# Patient Record
Sex: Male | Born: 1935 | Race: White | Hispanic: No | Marital: Married | Smoking: Never smoker
Health system: Southern US, Community
[De-identification: ages and names within clinical notes are randomized; demographics above are authoritative.]

## PROBLEM LIST (undated history)

## (undated) DIAGNOSIS — H9190 Unspecified hearing loss, unspecified ear: Secondary | ICD-10-CM

## (undated) DIAGNOSIS — M1990 Unspecified osteoarthritis, unspecified site: Secondary | ICD-10-CM

## (undated) DIAGNOSIS — F32A Depression, unspecified: Secondary | ICD-10-CM

## (undated) DIAGNOSIS — M810 Age-related osteoporosis without current pathological fracture: Secondary | ICD-10-CM

## (undated) DIAGNOSIS — Z923 Personal history of irradiation: Secondary | ICD-10-CM

## (undated) DIAGNOSIS — K5900 Constipation, unspecified: Secondary | ICD-10-CM

## (undated) DIAGNOSIS — E785 Hyperlipidemia, unspecified: Secondary | ICD-10-CM

## (undated) DIAGNOSIS — N400 Enlarged prostate without lower urinary tract symptoms: Secondary | ICD-10-CM

## (undated) DIAGNOSIS — Z9989 Dependence on other enabling machines and devices: Secondary | ICD-10-CM

## (undated) DIAGNOSIS — K5790 Diverticulosis of intestine, part unspecified, without perforation or abscess without bleeding: Secondary | ICD-10-CM

## (undated) DIAGNOSIS — R011 Cardiac murmur, unspecified: Secondary | ICD-10-CM

## (undated) DIAGNOSIS — K2270 Barrett's esophagus without dysplasia: Secondary | ICD-10-CM

## (undated) DIAGNOSIS — D649 Anemia, unspecified: Secondary | ICD-10-CM

## (undated) DIAGNOSIS — Q2730 Arteriovenous malformation, site unspecified: Secondary | ICD-10-CM

## (undated) DIAGNOSIS — D72820 Lymphocytosis (symptomatic): Secondary | ICD-10-CM

## (undated) DIAGNOSIS — C61 Malignant neoplasm of prostate: Secondary | ICD-10-CM

## (undated) DIAGNOSIS — E162 Hypoglycemia, unspecified: Secondary | ICD-10-CM

## (undated) DIAGNOSIS — N644 Mastodynia: Secondary | ICD-10-CM

## (undated) DIAGNOSIS — E559 Vitamin D deficiency, unspecified: Secondary | ICD-10-CM

## (undated) DIAGNOSIS — F329 Major depressive disorder, single episode, unspecified: Secondary | ICD-10-CM

## (undated) DIAGNOSIS — R0981 Nasal congestion: Secondary | ICD-10-CM

## (undated) DIAGNOSIS — R339 Retention of urine, unspecified: Secondary | ICD-10-CM

## (undated) DIAGNOSIS — G4730 Sleep apnea, unspecified: Secondary | ICD-10-CM

## (undated) DIAGNOSIS — K219 Gastro-esophageal reflux disease without esophagitis: Secondary | ICD-10-CM

## (undated) HISTORY — DX: Diverticulosis of intestine, part unspecified, without perforation or abscess without bleeding: K57.90

## (undated) HISTORY — DX: Barrett's esophagus without dysplasia: K22.70

## (undated) HISTORY — DX: Lymphocytosis (symptomatic): D72.820

## (undated) HISTORY — DX: Sleep apnea, unspecified: G47.30

## (undated) HISTORY — PX: VASECTOMY: SHX75

## (undated) HISTORY — PX: UPPER GASTROINTESTINAL ENDOSCOPY: SHX188

## (undated) HISTORY — DX: Vitamin D deficiency, unspecified: E55.9

## (undated) HISTORY — PX: FINGER SURGERY: SHX640

## (undated) HISTORY — DX: Gastro-esophageal reflux disease without esophagitis: K21.9

## (undated) HISTORY — DX: Unspecified osteoarthritis, unspecified site: M19.90

## (undated) HISTORY — DX: Major depressive disorder, single episode, unspecified: F32.9

## (undated) HISTORY — PX: TONSILLECTOMY: SUR1361

## (undated) HISTORY — DX: Hyperlipidemia, unspecified: E78.5

## (undated) HISTORY — DX: Depression, unspecified: F32.A

## (undated) HISTORY — PX: EYE SURGERY: SHX253

## (undated) HISTORY — DX: Benign prostatic hyperplasia without lower urinary tract symptoms: N40.0

## (undated) HISTORY — PX: INGUINAL HERNIA REPAIR: SUR1180

## (undated) HISTORY — DX: Arteriovenous malformation, site unspecified: Q27.30

## (undated) HISTORY — PX: CATARACT EXTRACTION: SUR2

## (undated) HISTORY — PX: COLONOSCOPY: SHX174

## (undated) HISTORY — PX: SPINAL FUSION: SHX223

## (undated) HISTORY — PX: OTHER SURGICAL HISTORY: SHX169

---

## 2000-03-08 ENCOUNTER — Ambulatory Visit (HOSPITAL_COMMUNITY): Admission: RE | Admit: 2000-03-08 | Discharge: 2000-03-08 | Payer: Self-pay | Admitting: Specialist

## 2000-03-08 ENCOUNTER — Encounter: Payer: Self-pay | Admitting: Specialist

## 2001-05-27 ENCOUNTER — Encounter: Admission: RE | Admit: 2001-05-27 | Discharge: 2001-05-27 | Payer: Self-pay | Admitting: Specialist

## 2001-05-27 ENCOUNTER — Encounter: Payer: Self-pay | Admitting: Specialist

## 2004-05-30 ENCOUNTER — Encounter: Admission: RE | Admit: 2004-05-30 | Discharge: 2004-05-30 | Payer: Self-pay | Admitting: Internal Medicine

## 2004-07-27 ENCOUNTER — Encounter: Payer: Self-pay | Admitting: Gastroenterology

## 2004-10-12 ENCOUNTER — Encounter: Admission: RE | Admit: 2004-10-12 | Discharge: 2004-10-12 | Payer: Self-pay | Admitting: Specialist

## 2005-01-05 ENCOUNTER — Encounter: Admission: RE | Admit: 2005-01-05 | Discharge: 2005-01-05 | Payer: Self-pay | Admitting: Internal Medicine

## 2007-12-18 ENCOUNTER — Encounter: Admission: RE | Admit: 2007-12-18 | Discharge: 2007-12-18 | Payer: Self-pay | Admitting: Specialist

## 2008-07-20 ENCOUNTER — Ambulatory Visit: Payer: Self-pay | Admitting: Gastroenterology

## 2008-07-20 DIAGNOSIS — K219 Gastro-esophageal reflux disease without esophagitis: Secondary | ICD-10-CM

## 2008-07-20 DIAGNOSIS — R195 Other fecal abnormalities: Secondary | ICD-10-CM

## 2008-07-20 DIAGNOSIS — K4090 Unilateral inguinal hernia, without obstruction or gangrene, not specified as recurrent: Secondary | ICD-10-CM | POA: Insufficient documentation

## 2008-07-20 DIAGNOSIS — R1319 Other dysphagia: Secondary | ICD-10-CM | POA: Insufficient documentation

## 2008-07-20 DIAGNOSIS — K439 Ventral hernia without obstruction or gangrene: Secondary | ICD-10-CM | POA: Insufficient documentation

## 2008-07-20 DIAGNOSIS — K5900 Constipation, unspecified: Secondary | ICD-10-CM | POA: Insufficient documentation

## 2008-07-27 ENCOUNTER — Telehealth: Payer: Self-pay | Admitting: Gastroenterology

## 2008-08-04 ENCOUNTER — Encounter: Payer: Self-pay | Admitting: Gastroenterology

## 2008-08-04 ENCOUNTER — Ambulatory Visit: Payer: Self-pay | Admitting: Gastroenterology

## 2008-08-07 ENCOUNTER — Encounter: Payer: Self-pay | Admitting: Gastroenterology

## 2008-12-11 ENCOUNTER — Encounter: Payer: Self-pay | Admitting: Gastroenterology

## 2009-02-19 ENCOUNTER — Ambulatory Visit (HOSPITAL_BASED_OUTPATIENT_CLINIC_OR_DEPARTMENT_OTHER): Admission: RE | Admit: 2009-02-19 | Discharge: 2009-02-19 | Payer: Self-pay | Admitting: Orthopedic Surgery

## 2009-02-19 ENCOUNTER — Encounter (INDEPENDENT_AMBULATORY_CARE_PROVIDER_SITE_OTHER): Payer: Self-pay | Admitting: Orthopedic Surgery

## 2009-09-29 ENCOUNTER — Encounter: Admission: RE | Admit: 2009-09-29 | Discharge: 2009-09-29 | Payer: Self-pay | Admitting: Specialist

## 2009-12-04 HISTORY — PX: REPAIR SPIGELIAN HERNIA: SUR1208

## 2010-02-22 ENCOUNTER — Ambulatory Visit: Payer: Self-pay | Admitting: Gastroenterology

## 2010-02-22 DIAGNOSIS — K2270 Barrett's esophagus without dysplasia: Secondary | ICD-10-CM

## 2010-02-22 DIAGNOSIS — R490 Dysphonia: Secondary | ICD-10-CM | POA: Insufficient documentation

## 2010-02-25 ENCOUNTER — Ambulatory Visit (HOSPITAL_COMMUNITY)
Admission: RE | Admit: 2010-02-25 | Discharge: 2010-02-25 | Payer: Self-pay | Source: Home / Self Care | Admitting: Gastroenterology

## 2010-03-07 ENCOUNTER — Encounter: Payer: Self-pay | Admitting: Gastroenterology

## 2010-03-23 ENCOUNTER — Telehealth: Payer: Self-pay | Admitting: Gastroenterology

## 2010-03-24 ENCOUNTER — Ambulatory Visit (HOSPITAL_COMMUNITY): Admission: RE | Admit: 2010-03-24 | Discharge: 2010-03-24 | Payer: Self-pay | Admitting: Surgery

## 2010-03-24 ENCOUNTER — Encounter (INDEPENDENT_AMBULATORY_CARE_PROVIDER_SITE_OTHER): Payer: Self-pay | Admitting: *Deleted

## 2010-04-27 ENCOUNTER — Telehealth: Payer: Self-pay | Admitting: Gastroenterology

## 2010-07-20 ENCOUNTER — Telehealth: Payer: Self-pay | Admitting: Gastroenterology

## 2010-09-19 ENCOUNTER — Ambulatory Visit: Payer: Self-pay | Admitting: Gastroenterology

## 2010-11-21 ENCOUNTER — Ambulatory Visit: Payer: Self-pay | Admitting: Gastroenterology

## 2010-11-21 DIAGNOSIS — K449 Diaphragmatic hernia without obstruction or gangrene: Secondary | ICD-10-CM | POA: Insufficient documentation

## 2010-11-21 DIAGNOSIS — K5730 Diverticulosis of large intestine without perforation or abscess without bleeding: Secondary | ICD-10-CM | POA: Insufficient documentation

## 2010-11-29 ENCOUNTER — Encounter: Payer: Self-pay | Admitting: Gastroenterology

## 2010-11-29 ENCOUNTER — Ambulatory Visit: Payer: Self-pay | Admitting: Internal Medicine

## 2010-12-24 ENCOUNTER — Encounter: Payer: Self-pay | Admitting: Specialist

## 2011-01-05 NOTE — Progress Notes (Signed)
Summary: med change  Phone Note Call from Patient Call back at Home Phone [phone]   Caller: Patient Call For: Dr. Russella Dar Reason for Call: Talk to Nurse Summary of Call: would like to change Nexium to different medication... too expensive and "inconvenient" Initial call taken by: Vallarie Mare,  Apr 27, 2010 11:18 AM  Follow-up for Phone Call        Pt states Nexium is too expensive and wants to take a generic PPI to take b/c his insurance will not cover enough of the brand names. Pt states he has a prescription for omeprazole 40mg  one tablet by mouth once daily. I told him to take omeprazole 40mg  one tablet by mouth two times a day and the Zantac at night and to call us when he needs a refill. Pt agreed. Follow-up by: Christie Nottingham CMA Duncan Dull),  Apr 27, 2010 2:12 PM

## 2011-01-05 NOTE — Assessment & Plan Note (Signed)
Summary: dysphagia...as.   History of Present Illness Visit Type: Follow-up Visit Primary GI MD: Elie Goody MD [facility] Primary Provider: Jarome Matin MD Requesting Provider: na Chief Complaint: Patient complains of dysphagia with water sometimes. He states that the dysphagia started about 6 months ago. He complains of some coughing and esophgeal tightness. History of Present Illness:   Charles [name], returns today with his wife, complaining of hoarseness and coughing when eating solids and liquids. He localizes his symptoms to his throat. He states his heartburn is well controlled on omeprazole. Evaluation in 2009, including a barium esophagram and upper endoscopy, showed Barrett's esophagus but no anatomic abnormalities to explain dysphagia. He did undergo an empiric dilation.   GI Review of Systems    Reports dysphagia with liquids and  dysphagia with solids.      Denies abdominal pain, acid reflux, belching, bloating, chest pain, heartburn, loss of appetite, nausea, vomiting, vomiting blood, weight loss, and  weight gain.        Denies anal fissure, black tarry stools, change in bowel habit, constipation, diarrhea, diverticulosis, fecal incontinence, heme positive stool, hemorrhoids, irritable bowel syndrome, jaundice, light color stool, liver problems, rectal bleeding, and  rectal pain.   Current Medications (verified): 1)  Ultram 50 Mg  Tabs (Tramadol Hcl) .... Take Two By Mouth Once Daily 2)  Nabumetone 500 Mg  Tabs (Nabumetone) .Marland Kitchen.. 1 Two Times A Day 3)  Flexeril 5 Mg  Tabs (Cyclobenzaprine Hcl) .... As Needed 4)  Finasteride 5 Mg  Tabs (Finasteride) .Marland Kitchen.. 1 Once Daily 5)  Flomax 0.4 Mg  Xr24h-Cap (Tamsulosin Hcl) .Marland Kitchen.. 1 Once Daily 6)  Topicort 0.25 %  Crea (Desoximetasone) .... As Needed 7)  Budeprion Sr 150 Mg Xr12h-Tab (Bupropion Hcl) .... Take Two By Mouth Two Times A Day 8)  Lovastatin 20 Mg  Tabs (Lovastatin) .Marland Kitchen.. 1 Once Daily 9)  Bactroban 2 %  Oint (Mupirocin) ....  As Needed 10)  Adult Aspirin Low Strength 81 Mg  Tbdp (Aspirin) .Marland Kitchen.. 1 Once Daily 11)  Niacin 250 Mg Tabs (Niacin) .... Take One By Mouth Once Daily 12)  Centrum Silver   Tabs (Multiple Vitamins-Minerals) .Marland Kitchen.. 1 Once Daily 13)  Calcium + D + K 750-500-40 Mg-Unt-Mcg  Tabs (Calcium-Vitamin D-Vitamin K) .Marland Kitchen.. 1 Once Daily 14)  Omeprazole 40 Mg  Cpdr (Omeprazole) .Marland Kitchen.. 1 Each Day 30 Minutes Before Meal 15)  Vicodin 5-500 Mg Tabs (Hydrocodone-Acetaminophen) .... Take One By Mouth At Bedtime 16)  Flonase 50 Mcg/act Susp (Fluticasone Propionate) .... Two Sprays Each Nostril Once A Day 17)  Fish Oil 1200 Mg Caps (Omega-3 Fatty Acids) .... Take One By Mouth Once Daily 18)  Cpap .... Use At Night  Allergies (verified): No Known Drug Allergies  Past History:  Past Medical History: Diverticulosis GERD Hyperlipidemia Hypoglycemia CAD BPH Depression Sleep apena (cpap) Barretts esophagus Spigelian hernia L inguinal hernia  Past Surgical History: L-spine fusion Hernia Surgery X 3 Tonsillectomy  Family History: Reviewed history from 07/20/2008 and no changes required. Mother: CAD Family History of Prostate Cancer: FATHER No FH of Colon Cancer:  Social History: Reviewed history from 07/17/2008 and no changes required. Married Retired Patient has never smoked.  Alcohol Use - no  Review of Systems       The patient complains of allergy/sinus, arthritis/joint pain, back pain, change in vision, cough, depression-new, heart murmur, sleeping problems, and voice change.         The pertinent positives and negatives are noted as above and in  the HPI. All other ROS were reviewed and were negative.   Vital Signs:  Patient profile:   77 year old male Height:      68 inches Weight:      171.7 pounds BMI:     26.20 Pulse rate:   62 / minute Pulse rhythm:   regular BP sitting:   126 / 64  (left arm) Cuff size:   regular  Vitals Entered By: Harlow Mares CMA Duncan Dull) (February 22, 2010 10:36  AM)  Physical Exam  General:  Well developed, well nourished, no acute distress. Head:  Normocephalic and atraumatic. Eyes:  PERRLA, no icterus. Ears:  Normal auditory acuity. Mouth:  No deformity or lesions, dentition normal. Neck:  Supple; no masses or thyromegaly. Lungs:  Clear throughout to auscultation. Heart:  Regular rate and rhythm; no murmurs, rubs,  or bruits. Abdomen:  Soft, nontender and nondistended. No masses, hepatosplenomegaly or hernias noted. Normal bowel sounds. Msk:  Symmetrical with no gross deformities. Normal posture. Pulses:  Normal pulses noted. Neurologic:  Alert and  oriented x4;  grossly normal neurologically. Cervical Nodes:  No significant cervical adenopathy. Inguinal Nodes:  No significant inguinal adenopathy. Psych:  Alert and cooperative. Normal mood and affect.  Impression & Recommendations:  Problem # 1:  DYSPHAGIA (ZOX-096.04) His symptoms are more typical for oropharyngeal dysphagia. Possible LPR. Schedule modified barium esophagram and ENT consultation. Consider increasing omeprazole to twice daily, will await ENT consult and swallow study results. Orders: Barium Swallow, Modified  (Modified BS)  Problem # 2:  BARRETT'S ESOPHAGUS (ICD-530.85) Continue omeprazole 40 mg daily, and standard antireflux measures.  Problem # 3:  OTH VENTRAL HERN W/O MENTION OBSTRUCTION/GANGREN (ICD-553.29) Spigelian hernia. Surgical management per Dr. Jamey Ripa planned on 03/24/10.  Problem # 4:  INGUINAL HERNIA, LEFT, SMALL (ICD-550.90) Further management per Dr. Jamey Ripa.  Other Orders: [facility] ([facility])  Patient Instructions: 1)  You have been scheduled to see Dr. Jearld Fenton at [facility] on 03/04/10 at 2:00pm arriving at 1:45pm. Please bring a current med list and copay. 2)  You have also been scheduled for a Modified Barium Swallow on 02/25/10 at 11:00 arriving at 1st floor admitting at [facility]. 3)  Pick up your prescription at your  pharmacy.  4)  Copy sent to : Jarome Matin, MD 5)  The medication list was reviewed and reconciled.  All changed / newly prescribed medications were explained.  A complete medication list was provided to the patient / caregiver.  Prescriptions: OMEPRAZOLE 40 MG  CPDR (OMEPRAZOLE) 1 each day 30 minutes before meal  #90 x 3   Entered by:   Christie Nottingham CMA (AAMA)   Authorized by:   Meryl Dare MD [facility]   Signed by:   Christie Nottingham CMA (AAMA) on 02/22/2010   Method used:   Electronically to        [facility] ([facility]ail)       [address]       [place], [place]  [address]       Ph: [phone]       Fax: [phone]   RxID:   [id number]

## 2011-01-05 NOTE — Assessment & Plan Note (Signed)
Summary: F/U APPT...LSW.   History of Present Illness Visit Type: Follow-up Visit Primary GI MD: Elie Goody MD [facility] Primary Provider: Jarome Matin MD Requesting Provider: na Chief Complaint: F/u for dysphagia, voice changes, and cough. Pt states that he is feeling better but still has cough and hoarseness  History of Present Illness:   Charles [name], and his wife does report a substantial improvement in all his symptoms. He is following antireflux measures very closely.   GI Review of Systems      Denies abdominal pain, acid reflux, belching, bloating, chest pain, dysphagia with liquids, dysphagia with solids, heartburn, loss of appetite, nausea, vomiting, vomiting blood, weight loss, and  weight gain.        Denies anal fissure, black tarry stools, change in bowel habit, constipation, diarrhea, diverticulosis, fecal incontinence, heme positive stool, hemorrhoids, irritable bowel syndrome, jaundice, light color stool, liver problems, rectal bleeding, and  rectal pain.   Current Medications (verified): 1)  Ultram 50 Mg  Tabs (Tramadol Hcl) .... Take Two By Mouth Will Take Up To Three Times A Day Depending 2)  Nabumetone 500 Mg  Tabs (Nabumetone) .... 2  Two Times A Day As Needed 3)  Finasteride 5 Mg  Tabs (Finasteride) .Marland Kitchen.. 1 Once Daily 4)  Flomax 0.4 Mg  Xr24h-Cap (Tamsulosin Hcl) .Marland Kitchen.. 1 Once Daily 5)  Topicort 0.25 %  Crea (Desoximetasone) .... As Needed 6)  Budeprion Sr 150 Mg Xr12h-Tab (Bupropion Hcl) .... Take One By Mouth Two Times A Day 7)  Lovastatin 20 Mg  Tabs (Lovastatin) .Marland Kitchen.. 1 Once Daily 8)  Bactroban 2 %  Oint (Mupirocin) .... As Needed 9)  Adult Aspirin Low Strength 81 Mg  Tbdp (Aspirin) .Marland Kitchen.. 1 Once Daily 10)  Centrum Silver   Tabs (Multiple Vitamins-Minerals) .Marland Kitchen.. 1 Once Daily 11)  Calcium + D + K 750-500-40 Mg-Unt-Mcg  Tabs (Calcium-Vitamin D-Vitamin K) .Marland Kitchen.. 1 Once Daily 12)  Flonase 50 Mcg/act Susp (Fluticasone Propionate) .... Two Sprays Each Nostril Once  A Day 13)  Fish Oil 1200 Mg Caps (Omega-3 Fatty Acids) .... Take One By Mouth Once Daily 14)  Cpap .... Use At Night 15)  Zantac 300 Mg Tabs (Ranitidine Hcl) .Marland Kitchen.. 1 By Mouth Q Hs 16)  Omeprazole 40 Mg Cpdr (Omeprazole) .... One Tablet By Mouth Two Times A Day  Allergies (verified): No Known Drug Allergies  Past History:  Past Medical History: Diverticulosis GERD Barretts esophagus Hyperlipidemia Hypoglycemia CAD BPH Depression Sleep apena (cpap) L inguinal hernia Hiatal Hernia--EGD 08/04/2008  Past Surgical History: L-spine fusion Hernia Surgery X 3 Tonsillectomy L spigellian hernia repair 03/2010  Family History: Reviewed history from 07/20/2008 and no changes required. Mother: CAD Family History of Prostate Cancer: FATHER No FH of Colon Cancer:  Social History: Reviewed history from 07/17/2008 and no changes required. Married Retired Patient has never smoked.  Alcohol Use - no  Review of Systems       The patient complains of allergy/sinus, cough, muscle pains/cramps, sleeping problems, and voice change.         The pertinent positives and negatives are noted as above and in the HPI. All other ROS were reviewed and were negative.   Vital Signs:  Patient profile:   77 year old male Height:      68 inches Weight:      172 pounds BMI:     26.25 BSA:     1.92 Pulse rate:   74 / minute Pulse rhythm:   regular BP sitting:  128 / 64  (left arm) Cuff size:   regular  Vitals Entered By: Charles [name] CMA (November 21, 2010 2:33 PM)  Physical Exam  General:  Well developed, well nourished, no acute distress. Head:  Normocephalic and atraumatic. Eyes:  PERRLA, no icterus. Mouth:  No deformity or lesions, dentition normal. Lungs:  Clear throughout to auscultation. Heart:  Regular rate and rhythm; no murmurs, rubs,  or bruits. Abdomen:  Soft, nontender and nondistended. No masses, hepatosplenomegaly or hernias noted. Normal bowel sounds. Neurologic:  Alert  and  oriented x4;  grossly normal neurologically. Psych:  Alert and cooperative. Normal mood and affect.  Impression & Recommendations:  Problem # 1:  BARRETT'S ESOPHAGUS (ICD-530.85) GERD with Barrett's esophagus and LPR. Symptoms under very good control on PPI twice daily, along with an H2 RA at bedtime and antireflux measures. Continue all above. Surveillance for Barrett's due September 2012. Given long-term PPI use will obtain a bone density study evaluate for osteopenia, osteoporosis.  Other Orders: T-Bone Densitometry [id number])  Patient Instructions: 1)  Pick up your prescriptions from your pharmacy.  2)  Please schedule a follow-up appointment in 1 year. 3)  Copy sent to : Jarome Matin MD 4)  The medication list was reviewed and reconciled.  All changed / newly prescribed medications were explained.  A complete medication list was provided to the patient / caregiver.  Prescriptions: OMEPRAZOLE 40 MG CPDR (OMEPRAZOLE) one tablet by mouth two times a day before breakfast and dinner  #60 x 11   Entered by:   Christie Nottingham CMA (AAMA)   Authorized by:   Meryl Dare MD [facility]   Signed by:   Christie Nottingham CMA (AAMA) on 11/21/2010   Method used:   Electronically to        [facility] ([facility]       [address]       [place], [place]  [address]       Ph: [phone]       Fax: [phone]   RxID:   [id number] ZANTAC 300 MG TABS (RANITIDINE HCL) 1 by mouth q hs  #30 x 11   Entered by:   Christie Nottingham CMA (AAMA)   Authorized by:   Meryl Dare MD [facility]   Signed by:   Christie Nottingham CMA (AAMA) on 11/21/2010   Method used:   Electronically to        [facility]       [address]       [place], [place]  [address]       Ph: [phone]       Fax: [phone]   RxID:   [id number]

## 2011-01-05 NOTE — Progress Notes (Signed)
Summary: Medication  Phone Note Call from Patient Call back at Home Phone [phone]   Caller: Patient Call For: Dr. Russella Dar Reason for Call: Refill Medication Summary of Call: Needs a refill for Zanac and requesting 90 tab., Omeprazole 180 tab.[facility]...would like for CMA to call him about meds. Initial call taken by: Karna Christmas,  July 20, 2010 3:01 PM  Follow-up for Phone Call        Rx was sent to pts pharmacy and pt notified. Pt states he still has some reflux symptoms. Told pt to intensify all anti-reflux measures and come in for a f/u appt on 09/19/10 at 3:15pm. Pt agreed. Follow-up by: Christie Nottingham CMA Duncan Dull),  July 20, 2010 3:18 PM    New/Updated Medications: OMEPRAZOLE 40 MG CPDR (OMEPRAZOLE) one tablet by mouth two times a day Prescriptions: OMEPRAZOLE 40 MG CPDR (OMEPRAZOLE) one tablet by mouth two times a day  #180 x 1   Entered by:   Christie Nottingham CMA (AAMA)   Authorized by:   Meryl Dare MD [facility]   Signed by:   Christie Nottingham CMA (AAMA) on 07/20/2010   Method used:   Electronically to        [facility]       [address]       [place], [place]  [address]       Ph: [phone]       Fax: [phone]   RxID:   [id number] ZANTAC 300 MG TABS (RANITIDINE HCL) 1 by mouth q hs  #90 x 1   Entered by:   Christie Nottingham CMA (AAMA)   Authorized by:   Meryl Dare MD [facility]   Signed by:   Christie Nottingham CMA (AAMA) on 07/20/2010   Method used:   Electronically to        [facility]       [address]       [place], [place]  [address]       Ph: [phone]       Fax: [phone]   RxID:   [id number]

## 2011-01-05 NOTE — Op Note (Signed)
Summary: Repair of left spigelian hernia    NAME:  Charles [name], Charles [name]           ACCOUNT NO.:  [id number]      MEDICAL RECORD NO.:  [id number]          PATIENT TYPE:  AMB      LOCATION:  SDS                          FACILITY:  [facility]      PHYSICIAN:  Currie Paris, M.D.DATE OF BIRTH:  1931-01-15      DATE OF PROCEDURE:  03/24/2010   DATE OF DISCHARGE:                                  OPERATIVE REPORT      PREOPERATIVE DIAGNOSIS:  Left spigelian hernia.      POSTOPERATIVE DIAGNOSIS:  Left spigelian hernia.      PROCEDURE:  Repair of left spigelian hernia.      SURGEON:  Currie Paris, MD.      ANESTHESIA:  General endotracheal.      CLINICAL HISTORY:  This gentleman has had known a spigelian hernia, but   has developed increasing discomfort.  There was a question of an   inguinal hernia on a scan, but none was visible or palpable on exam and   he was asymptomatic in the inguinal area.      DESCRIPTION OF PROCEDURE:  I saw the patient in the holding area and we   confirmed the plans as noted.  I had him strain, so we can mark the   exact location of the hernia on the abdominal wall in the left lower   quadrant.      The patient was then taken to the operating room and after satisfactory   general endotracheal anesthesia had been obtained, the abdomen was   clipped, prepped, and draped.  The time-out was done.      I injected some 0.25% plain Marcaine over the area of the hernia.  I   made a transverse incision and deepened it to the external oblique.  The   external oblique was opened transversely.  Immediately upon doing that,   there was a large bulge of fatty material noted in the plane beneath the   external oblique aponeurosis and fascia.  This was freed up off the   underlying internal oblique muscle and appeared to be coming through a   defect in the usual location, first spigelian hernia just lateral to the   rectus muscle.  I was able to reduce this and the  actual defect was   fairly small.  I elevated the external oblique fascia in all directions.   I had good exposure of the underlying tissue and made sure there was   nothing else more medial.      The actual defect was closed with some interrupted 0 Prolene sutures.  I   then took about a 3 inches x 3 inches piece of Bard polypropylene mesh   and used this as an onlay graft.  I threaded one of the sutures used for   the closure through the mesh and anchored it with that and then tacked   it down circumferentially and well around the area of the defect and   over the internal oblique.  I put more Marcaine  in to help with postop   analgesia.  I made sure everything was dry.      The external oblique was closed with a running 2-0 Prolene, Scarpa's   with a 3-0 Vicryl, and the skin with 4-0 Monocryl subcuticular plus   Dermabond.      The patient tolerated the procedure well.  There were no operative   complications.  Counts were correct.               Currie Paris, M.D.            CJS/MEDQ  D:  03/24/2010  T:  03/24/2010  Job:  [id number]      cc:   Barry Dienes. Eloise Harman, M.D.      Electronically Signed by Cyndia Bent M.D. on 03/28/2010 12:52:16 PM

## 2011-01-05 NOTE — Consult Note (Signed)
Summary: [facility]  [facility]   Imported By: Sherian Rein 03/16/2010 09:54:35  _____________________________________________________________________  External Attachment:    Type:   Image     Comment:   External Document

## 2011-01-05 NOTE — Miscellaneous (Signed)
Summary: BONE DENSITY  Clinical Lists Changes  Orders: Added new Test order of T-Lumbar Vertebral Assessment (77082) - Signed 

## 2011-01-05 NOTE — Assessment & Plan Note (Signed)
Summary: Reflux f/u/all   History of Present Illness Visit Type: Follow-up Visit Primary GI MD: Elie Goody MD [facility] Primary Provider: Jarome Matin MD Requesting Provider: na Chief Complaint: F/u for dysphagia. Pt c/o voice changes and cough  History of Present Illness:   Mr. Jaquith, returns with his wife today complaining of persistent problems with hoarseness and vocal changes. Occasional cough, sinus congestion and postnasal drip. The symptoms have only improved minimally, on PPI b.i.d. and an H2 RA at night. He has occasional dysphagia symptoms, mainly to bread and peanut butter. He has snacks in the evening and does not elevate the head of his bed. His endoscopy performed 2 years ago showed Barrett's esophagus.   GI Review of Systems    Reports dysphagia with solids.      Denies abdominal pain, acid reflux, belching, bloating, chest pain, dysphagia with liquids, heartburn, loss of appetite, nausea, vomiting, vomiting blood, weight loss, and  weight gain.        Denies anal fissure, black tarry stools, change in bowel habit, constipation, diarrhea, diverticulosis, fecal incontinence, heme positive stool, hemorrhoids, irritable bowel syndrome, jaundice, light color stool, liver problems, rectal bleeding, and  rectal pain.   Current Medications (verified): 1)  Ultram 50 Mg  Tabs (Tramadol Hcl) .... Take Two By Mouth Once Daily 2)  Nabumetone 500 Mg  Tabs (Nabumetone) .Marland Kitchen.. 1 Two Times A Day 3)  Flexeril 5 Mg  Tabs (Cyclobenzaprine Hcl) .... As Needed 4)  Finasteride 5 Mg  Tabs (Finasteride) .Marland Kitchen.. 1 Once Daily 5)  Flomax 0.4 Mg  Xr24h-Cap (Tamsulosin Hcl) .Marland Kitchen.. 1 Once Daily 6)  Topicort 0.25 %  Crea (Desoximetasone) .... As Needed 7)  Budeprion Sr 150 Mg Xr12h-Tab (Bupropion Hcl) .... Take One By Mouth Two Times A Day 8)  Lovastatin 20 Mg  Tabs (Lovastatin) .Marland Kitchen.. 1 Once Daily 9)  Bactroban 2 %  Oint (Mupirocin) .... As Needed 10)  Adult Aspirin Low Strength 81 Mg  Tbdp  (Aspirin) .Marland Kitchen.. 1 Once Daily 11)  Niacin 250 Mg Tabs (Niacin) .... Take One By Mouth Once Daily 12)  Centrum Silver   Tabs (Multiple Vitamins-Minerals) .Marland Kitchen.. 1 Once Daily 13)  Calcium + D + K 750-500-40 Mg-Unt-Mcg  Tabs (Calcium-Vitamin D-Vitamin K) .Marland Kitchen.. 1 Once Daily 14)  Vicodin 5-500 Mg Tabs (Hydrocodone-Acetaminophen) .... Take One By Mouth At Bedtime 15)  Flonase 50 Mcg/act Susp (Fluticasone Propionate) .... Two Sprays Each Nostril Once A Day 16)  Fish Oil 1200 Mg Caps (Omega-3 Fatty Acids) .... Take One By Mouth Once Daily 17)  Cpap .... Use At Night 18)  Zantac 300 Mg Tabs (Ranitidine Hcl) .Marland Kitchen.. 1 By Mouth Q Hs 19)  Omeprazole 40 Mg Cpdr (Omeprazole) .... One Tablet By Mouth Two Times A Day  Allergies (verified): No Known Drug Allergies  Past History:  Past Medical History: Diverticulosis GERD Barretts esophagus Hyperlipidemia Hypoglycemia CAD BPH Depression Sleep apena (cpap) Spigelian hernia L inguinal hernia  Past Surgical History: Reviewed history from 02/22/2010 and no changes required. L-spine fusion Hernia Surgery X 3 Tonsillectomy  Family History: Reviewed history from 07/20/2008 and no changes required. Mother: CAD Family History of Prostate Cancer: FATHER No FH of Colon Cancer:  Social History: Reviewed history from 07/17/2008 and no changes required. Married Retired Patient has never smoked.  Alcohol Use - no  Review of Systems       The patient complains of allergy/sinus, cough, and voice change.         The pertinent positives  and negatives are noted as above and in the HPI. All other ROS were reviewed and were negative.   Vital Signs:  Patient profile:   77 year old male Height:      68 inches Weight:      172 pounds BMI:     26.25 BSA:     1.92 Pulse rate:   72 / minute Pulse rhythm:   regular BP sitting:   120 / 64  (left arm) Cuff size:   regular  Vitals Entered By: Ok Anis CMA (September 19, 2010 3:23 PM)  Physical  Exam  General:  Well developed, well nourished, no acute distress. Head:  Normocephalic and atraumatic. Eyes:  PERRLA, no icterus. Ears:  Normal auditory acuity. Mouth:  No deformity or lesions, dentition normal. Lungs:  Clear throughout to auscultation. Heart:  Regular rate and rhythm; no murmurs, rubs,  or bruits. Abdomen:  Soft, nontender and nondistended. No masses, hepatosplenomegaly or hernias noted. Normal bowel sounds. Psych:  depressed affect.    Impression & Recommendations:  Problem # 1:  BARRETT'S ESOPHAGUS (ICD-530.85) Mr. Kisner has GERD with Barrett's esophagus, however, his ENT symptoms have not significantly improved on an aggressive acid suppression regimen suggesting a problem other than LPR. He has symptoms consistent with sinus congestion and sinus drainage. He will begin elevating the head of his bed and avoiding any foods for at least 3 hours prior to bedtime. If his symptoms do not respond we can consider an esophageal manometry, GES and a 24 hour ambulatory pH study performed on medications. Continue current regimen of omeprazole 40 mg twice daily, taken 30 minutes before breakfast and dinner and ranitidine 300 mg h.s. He will need to return to Dr. Jearld Fenton, to look for additional ENT problems contributing to symptoms if his symptoms do not completely respond to the above measures. Surveillance endoscopy recommended September 2012.  Problem # 2:  DYSPHAGIA (ICD-787.29) Intermittent mild dysphagia, which has not changed. See problem #1. If the symptoms persist, schedule upper endoscopy at his return visit.  Patient Instructions: 1)  Avoid foods high in acid content ( tomatoes, citrus juices, spicy foods) . Avoid eating within 3 to 4 hours of lying down or before exercising. Do not over eat; try smaller more frequent meals. Elevate head of bed four inches when sleeping.  2)  Please schedule a follow-up appointment in 8 weeks.  3)  Copy sent to : Jarome Matin, MD 4)                           Suzanna Obey, MD 5)  The medication list was reviewed and reconciled.  All changed / newly prescribed medications were explained.  A complete medication list was provided to the patient / caregiver.

## 2011-01-05 NOTE — Progress Notes (Signed)
Summary: Test results   Phone Note Call from Patient Call back at Home Phone [phone]   Caller: Patient Call For: Dr. Russella Dar Reason for Call: Lab or Test Results Summary of Call: Calling about test results from March Initial call taken by: Karna Christmas,  March 23, 2010 11:13 AM  Follow-up for Phone Call        Patient  met with Dr Jearld Fenton.  Dr Jearld Fenton had recommendations for two times a day non generic PPI and possible zantac.  The patient wanted your opinion if this is ok.  Please see assessment and plan on scanned note from 03/07/10.  Patient  also wants to know when his follow up with you should be.  Please advise. Follow-up by: Darcey Nora RN, CGRN,  March 23, 2010 12:04 PM  Additional Follow-up for Phone Call Additional follow up Details #1::        I agree with Dr. Jearld Fenton recommendations. Should follow up with [place] in 3 months and with Dr. Jearld Fenton as he recommends. Additional Follow-up by: Meryl Dare MD Clementeen Graham,  March 23, 2010 12:10 PM    Additional Follow-up for Phone Call Additional follow up Details #2::    Per Dr Jearld Fenton note patient should try non generic PPI two times a day and zantac 300 mg q hs.  Per patient's insurance coverage the only name bradn PPI's covered are Nexium and dexilant.  Patient  would like to start with Nexium.  New rx sent to the pharmacy.  he will call call with further questions. Follow-up by: Darcey Nora RN, CGRN,  March 23, 2010 1:20 PM  New/Updated Medications: NEXIUM 40 MG CPDR (ESOMEPRAZOLE MAGNESIUM) 1 by mouth two times a day ZANTAC 300 MG TABS (RANITIDINE HCL) 1 by mouth q hs Prescriptions: ZANTAC 300 MG TABS (RANITIDINE HCL) 1 by mouth q hs  #30 x 3   Entered by:   Darcey Nora RN, CGRN   Authorized by:   Meryl Dare MD [facility]   Signed by:   Darcey Nora RN, CGRN on 03/23/2010   Method used:   Electronically to        [facility] (retail)       [address]       [place], [place]  [address]  Ph: [phone]       Fax: [phone]   RxID:   [id number] NEXIUM 40 MG CPDR (ESOMEPRAZOLE MAGNESIUM) 1 by mouth two times a day  #60 x 3   Entered by:   Darcey Nora RN, CGRN   Authorized by:   Meryl Dare MD [facility]   Signed by:   Darcey Nora RN, CGRN on 03/23/2010   Method used:   Electronically to        [facility]       [address]       [place], [place]  [address]       Ph: [phone]       Fax: [phone]   RxID:   [id number]

## 2011-02-21 LAB — BASIC METABOLIC PANEL
BUN: 15 mg/dL (ref 6–23)
CO2: 27 mEq/L (ref 19–32)
Calcium: 9.1 mg/dL (ref 8.4–10.5)
Chloride: 105 mEq/L (ref 96–112)
Creatinine, Ser: 0.89 mg/dL (ref 0.4–1.5)
GFR calc Af Amer: >60 mL/min (ref >60)
GFR calc non Af Amer: >60 mL/min (ref >60)
Glucose, Bld: 78 mg/dL (ref 70–99)
Potassium: 4.7 mEq/L (ref 3.5–5.1)
Sodium: 137 mEq/L (ref 135–145)

## 2011-02-21 LAB — CBC
HCT: 42.9 % (ref 39.0–52.0)
Hemoglobin: 15 g/dL (ref 13.0–17.0)
MCHC: 34.9 g/dL (ref 30.0–36.0)
MCV: 93.5 fL (ref 78.0–100.0)
Platelets: 135 10*3/uL — ABNORMAL LOW (ref 150–400)
RBC: 4.59 MIL/uL (ref 4.22–5.81)
RDW: 13 % (ref 11.5–15.5)
WBC: 5.6 10*3/uL (ref 4.0–10.5)

## 2011-03-16 LAB — GLUCOSE, CAPILLARY: Glucose-Capillary: 82 mg/dL (ref 70–99)

## 2011-03-16 LAB — POCT HEMOGLOBIN-HEMACUE: Hemoglobin: 15.4 g/dL (ref 13.0–17.0)

## 2011-04-18 NOTE — Op Note (Signed)
NAMEADEL, BURCH NO.:  [id number]   MEDICAL RECORD NO.:  [id number]          PATIENT TYPE:  AMB   LOCATION:  DSC                          FACILITY:  [facility]   PHYSICIAN:  Cindee Salt, M.D.       DATE OF BIRTH:  18-Jan-1931   DATE OF PROCEDURE:  02/19/2009  DATE OF DISCHARGE:                               OPERATIVE REPORT   PREOPERATIVE DIAGNOSIS:  Mucoid tumor, right thumb interphalangeal  joint.   POSTOPERATIVE DIAGNOSIS:  Mucoid tumor, right thumb interphalangeal  joint.   OPERATION:  Excision of mucoid cyst, debridement of interphalangeal  joint, right thumb.   SURGEON:  Cindee Salt, MD   ANESTHESIA:  Forearm-based IV regional.   HISTORY:  The patient is a 77 year old male with a history of a large  mass on the dorsal aspect of the IP joint of his thumb.  This  transilluminates, indicative of a large mucoid tumor.  He is desirous  having this removed.  Pre and postoperative course have been discussed  along with risks and complications.  He is aware that there is no  guarantee with surgery, possibility of infection, recurrence, injury to  arteries, nerves, and tendons, incomplete relief of symptoms, dystrophy,  and degenerative arthritis in the joint beneath with the potential for  recurrence due to that.  In the preoperative area, the patient is seen.  The extremity marked by both the patient and surgeon.  Antibiotic given.   PROCEDURE:  The patient is brought to the operating room where a forearm-  based IV regional anesthetic was carried out without difficulty.  He was  prepped using ChloraPrep, supine position with the right arm free.  A  time-out was taken.  A curvilinear incision was made over the  interphalangeal joint and carried down through subcutaneous tissue.  Bleeders were electrocauterized.  A large multilobulated cyst was  immediately apparent.  With blunt and sharp dissection, this was  dissected free.  It was followed down to the joint  on its radial aspect.  The joint was opened, area of osteophyte formation was debrided with a  small rongeur along with a synovectomy of the joint.  The wound was  copiously irrigated with saline.  The skin was then closed with  interrupted 5-0 Vicryl Rapide sutures.  Specimen was sent to pathology.  A sterile compressive dressing and splint to the thumb was applied.  The  patient tolerated the procedure well and was taken to the recovery for  observation in satisfactory condition.  Prior to placement of the  dressing, the metacarpal block to the thumb was given with 0.25%  Marcaine without epinephrine, 5 mL was used.  The patient tolerated the  procedure well.  He is discharged home to return to [facility]  [place] in 1 week on Vicodin and Keflex.           ______________________________  Cindee Salt, M.D.     GK/MEDQ  D:  02/19/2009  T:  02/20/2009  Job:  [id number]

## 2011-05-02 ENCOUNTER — Other Ambulatory Visit: Payer: Self-pay | Admitting: Gastroenterology

## 2011-08-12 ENCOUNTER — Other Ambulatory Visit: Payer: Self-pay | Admitting: Gastroenterology

## 2011-08-23 ENCOUNTER — Encounter: Payer: Self-pay | Admitting: Gastroenterology

## 2011-09-13 ENCOUNTER — Ambulatory Visit (AMBULATORY_SURGERY_CENTER): Payer: Medicare Other | Admitting: *Deleted

## 2011-09-13 VITALS — Ht 68.0 in | Wt 168.3 lb

## 2011-09-13 DIAGNOSIS — K2270 Barrett's esophagus without dysplasia: Secondary | ICD-10-CM

## 2011-09-27 ENCOUNTER — Encounter: Payer: Self-pay | Admitting: Gastroenterology

## 2011-09-27 ENCOUNTER — Ambulatory Visit (AMBULATORY_SURGERY_CENTER): Payer: Medicare Other | Admitting: Gastroenterology

## 2011-09-27 VITALS — BP 145/68 | HR 54 | Temp 97.5°F | Resp 19 | Ht 68.0 in | Wt 168.0 lb

## 2011-09-27 DIAGNOSIS — R1319 Other dysphagia: Secondary | ICD-10-CM

## 2011-09-27 DIAGNOSIS — K2270 Barrett's esophagus without dysplasia: Secondary | ICD-10-CM

## 2011-09-27 LAB — GLUCOSE, CAPILLARY: Glucose-Capillary: 85 mg/dL (ref 70–99)

## 2011-09-27 MED ORDER — SODIUM CHLORIDE 0.9 % IV SOLN: 500.0000 mL | INTRAVENOUS | Status: DC

## 2011-09-27 NOTE — Progress Notes (Signed)
Pt tolerated the egd procedure very well. maw

## 2011-09-27 NOTE — Patient Instructions (Signed)
PLEASE FOLLOW DISCHARGE INSTRUCTIONS GIVEN TODAY. FOLLOW CLEAR LIQUID DIET FOR 1 HOUR THEN SOFT DIET FOR REST OF TODAY. RESUME REGULAR DIET TODAY. BIOPSIES TAKEN TODAY, YOU WILL RECEIVE RESULT LETTER IN YOUR MAIL IN 1-2 WEEKS. CONTINUE WITH YOUR CURRENT MEDICATIONS. THANK YOU!!

## 2011-09-28 ENCOUNTER — Telehealth: Payer: Self-pay | Admitting: *Deleted

## 2011-09-28 NOTE — Telephone Encounter (Signed)

## 2011-10-02 ENCOUNTER — Encounter: Payer: Self-pay | Admitting: Gastroenterology

## 2011-11-09 ENCOUNTER — Other Ambulatory Visit: Payer: Self-pay

## 2011-11-09 MED ORDER — OMEPRAZOLE 40 MG PO CPDR: 40.0000 mg | DELAYED_RELEASE_CAPSULE | Freq: Two times a day (BID) | ORAL | Status: DC

## 2011-11-10 ENCOUNTER — Other Ambulatory Visit: Payer: Self-pay

## 2011-11-10 MED ORDER — RANITIDINE HCL 300 MG PO TABS: 300.0000 mg | ORAL_TABLET | Freq: Every day | ORAL | Status: DC

## 2011-12-20 DIAGNOSIS — M48061 Spinal stenosis, lumbar region without neurogenic claudication: Secondary | ICD-10-CM | POA: Diagnosis not present

## 2011-12-20 DIAGNOSIS — M961 Postlaminectomy syndrome, not elsewhere classified: Secondary | ICD-10-CM | POA: Diagnosis not present

## 2012-01-11 DIAGNOSIS — H2510 Age-related nuclear cataract, unspecified eye: Secondary | ICD-10-CM | POA: Diagnosis not present

## 2012-03-18 DIAGNOSIS — M47817 Spondylosis without myelopathy or radiculopathy, lumbosacral region: Secondary | ICD-10-CM | POA: Diagnosis not present

## 2012-03-18 DIAGNOSIS — I10 Essential (primary) hypertension: Secondary | ICD-10-CM | POA: Diagnosis not present

## 2012-03-18 DIAGNOSIS — M48061 Spinal stenosis, lumbar region without neurogenic claudication: Secondary | ICD-10-CM | POA: Diagnosis not present

## 2012-03-19 ENCOUNTER — Other Ambulatory Visit: Payer: Self-pay | Admitting: Specialist

## 2012-03-19 DIAGNOSIS — M419 Scoliosis, unspecified: Secondary | ICD-10-CM

## 2012-03-19 DIAGNOSIS — M79604 Pain in right leg: Secondary | ICD-10-CM

## 2012-03-26 ENCOUNTER — Ambulatory Visit
Admission: RE | Admit: 2012-03-26 | Discharge: 2012-03-26 | Disposition: A | Discharge status: Home / Self Care | Payer: Medicare Other | Source: Ambulatory Visit | Attending: Specialist | Admitting: Specialist

## 2012-03-26 DIAGNOSIS — M79604 Pain in right leg: Secondary | ICD-10-CM

## 2012-03-26 DIAGNOSIS — M419 Scoliosis, unspecified: Secondary | ICD-10-CM

## 2012-03-26 MED ORDER — GADOBENATE DIMEGLUMINE 529 MG/ML IV SOLN
15.0000 mL | Freq: Once | INTRAVENOUS | Status: AC | PRN
  Administered 2012-03-26: 15 mL via INTRAVENOUS

## 2012-04-12 DIAGNOSIS — M47817 Spondylosis without myelopathy or radiculopathy, lumbosacral region: Secondary | ICD-10-CM | POA: Diagnosis not present

## 2012-04-12 DIAGNOSIS — M961 Postlaminectomy syndrome, not elsewhere classified: Secondary | ICD-10-CM | POA: Diagnosis not present

## 2012-04-12 DIAGNOSIS — M48061 Spinal stenosis, lumbar region without neurogenic claudication: Secondary | ICD-10-CM | POA: Diagnosis not present

## 2012-04-15 ENCOUNTER — Other Ambulatory Visit: Payer: Self-pay | Admitting: Specialist

## 2012-04-15 DIAGNOSIS — M5410 Radiculopathy, site unspecified: Secondary | ICD-10-CM

## 2012-04-23 ENCOUNTER — Ambulatory Visit
Admission: RE | Admit: 2012-04-23 | Discharge: 2012-04-23 | Disposition: A | Discharge status: Home / Self Care | Payer: Medicare Other | Source: Ambulatory Visit | Attending: Specialist | Admitting: Specialist

## 2012-04-23 VITALS — BP 117/55 | HR 51

## 2012-04-23 DIAGNOSIS — M5410 Radiculopathy, site unspecified: Secondary | ICD-10-CM

## 2012-04-23 DIAGNOSIS — M5126 Other intervertebral disc displacement, lumbar region: Secondary | ICD-10-CM | POA: Diagnosis not present

## 2012-04-23 DIAGNOSIS — M47817 Spondylosis without myelopathy or radiculopathy, lumbosacral region: Secondary | ICD-10-CM | POA: Diagnosis not present

## 2012-04-23 DIAGNOSIS — M4310 Spondylolisthesis, site unspecified: Secondary | ICD-10-CM | POA: Diagnosis not present

## 2012-04-23 MED ORDER — IOHEXOL 180 MG/ML  SOLN
17.0000 mL | Freq: Once | INTRAMUSCULAR | Status: AC | PRN
  Administered 2012-04-23: 17 mL via INTRATHECAL

## 2012-04-23 MED ORDER — DIAZEPAM 5 MG PO TABS
5.0000 mg | ORAL_TABLET | Freq: Once | ORAL | Status: AC
  Administered 2012-04-23: 5 mg via ORAL

## 2012-04-23 NOTE — Discharge Instructions (Signed)
Myelogram Discharge Instructions  1. Go home and rest quietly for the next 24 hours.  It is important to lie flat for the next 24 hours.  Get up only to go to the restroom.  You may lie in the bed or on a couch on your back, your stomach, your left side or your right side.  You may have one pillow under your head.  You may have pillows between your knees while you are on your side or under your knees while you are on your back.  2. DO NOT drive today.  Recline the seat as far back as it will go, while still wearing your seat belt, on the way home.  3. You may get up to go to the bathroom as needed.  You may sit up for 10 minutes to eat.  You may resume your normal diet and medications unless otherwise indicated.  Drink lots of extra fluids today and tomorrow.  4. The incidence of headache, nausea, or vomiting is about 5% (one in 20 patients).  If you develop a headache, lie flat and drink plenty of fluids until the headache goes away.  Caffeinated beverages may be helpful.  If you develop severe nausea and vomiting or a headache that does not go away with flat bed rest, call [phone].  5. You may resume normal activities after your 24 hours of bed rest is over; however, do not exert yourself strongly or do any heavy lifting tomorrow. If when you get up you have a headache when standing, go back to bed and force fluids for another 24 hours.  6. Call your physician for a follow-up appointment.  The results of your myelogram will be sent directly to your physician by the following day.  7. If you have any questions or if complications develop after you arrive home, please call [phone].  Discharge instructions have been explained to the patient.  The patient, or the person responsible for the patient, fully understands these instructions.       May resume tramadol and bupropion on Apr 24, 2012, after 11:00 am.

## 2012-04-23 NOTE — Progress Notes (Signed)
Discharge instructions explained to patient who states understanding of them.  Larina Earthly, RN

## 2012-04-23 NOTE — Progress Notes (Signed)
Patient states he has been off Tramadol and Bupropion for the last two days.  jkl

## 2012-05-03 DIAGNOSIS — M48061 Spinal stenosis, lumbar region without neurogenic claudication: Secondary | ICD-10-CM | POA: Diagnosis not present

## 2012-05-03 DIAGNOSIS — M47817 Spondylosis without myelopathy or radiculopathy, lumbosacral region: Secondary | ICD-10-CM | POA: Diagnosis not present

## 2012-05-03 DIAGNOSIS — M961 Postlaminectomy syndrome, not elsewhere classified: Secondary | ICD-10-CM | POA: Diagnosis not present

## 2012-05-03 DIAGNOSIS — IMO0002 Reserved for concepts with insufficient information to code with codable children: Secondary | ICD-10-CM | POA: Diagnosis not present

## 2012-05-29 DIAGNOSIS — H6120 Impacted cerumen, unspecified ear: Secondary | ICD-10-CM | POA: Diagnosis not present

## 2012-06-03 DIAGNOSIS — D72820 Lymphocytosis (symptomatic): Secondary | ICD-10-CM

## 2012-06-03 HISTORY — DX: Lymphocytosis (symptomatic): D72.820

## 2012-06-27 DIAGNOSIS — E559 Vitamin D deficiency, unspecified: Secondary | ICD-10-CM | POA: Diagnosis not present

## 2012-06-27 DIAGNOSIS — Z79899 Other long term (current) drug therapy: Secondary | ICD-10-CM | POA: Diagnosis not present

## 2012-06-27 DIAGNOSIS — E785 Hyperlipidemia, unspecified: Secondary | ICD-10-CM | POA: Diagnosis not present

## 2012-07-04 DIAGNOSIS — Z0000 Encounter for general adult medical examination without abnormal findings: Secondary | ICD-10-CM | POA: Diagnosis not present

## 2012-07-04 DIAGNOSIS — D7289 Other specified disorders of white blood cells: Secondary | ICD-10-CM | POA: Diagnosis not present

## 2012-07-04 DIAGNOSIS — F329 Major depressive disorder, single episode, unspecified: Secondary | ICD-10-CM | POA: Diagnosis not present

## 2012-07-04 DIAGNOSIS — Z1212 Encounter for screening for malignant neoplasm of rectum: Secondary | ICD-10-CM | POA: Diagnosis not present

## 2012-07-04 DIAGNOSIS — I2510 Atherosclerotic heart disease of native coronary artery without angina pectoris: Secondary | ICD-10-CM | POA: Diagnosis not present

## 2012-07-05 DIAGNOSIS — M25559 Pain in unspecified hip: Secondary | ICD-10-CM | POA: Diagnosis not present

## 2012-07-05 DIAGNOSIS — M5430 Sciatica, unspecified side: Secondary | ICD-10-CM | POA: Diagnosis not present

## 2012-07-05 DIAGNOSIS — M4120 Other idiopathic scoliosis, site unspecified: Secondary | ICD-10-CM | POA: Diagnosis not present

## 2012-07-05 DIAGNOSIS — M48061 Spinal stenosis, lumbar region without neurogenic claudication: Secondary | ICD-10-CM | POA: Diagnosis not present

## 2012-07-11 ENCOUNTER — Telehealth: Payer: Self-pay | Admitting: Oncology

## 2012-07-11 NOTE — Telephone Encounter (Signed)
S/w the pt and she is aware of his new pt appt with dr ha on 07/24/2012

## 2012-07-11 NOTE — Telephone Encounter (Signed)
lmonvm adviisng the pt to call back to schedule his new pt appt with dr Gaylyn Rong

## 2012-07-17 ENCOUNTER — Telehealth: Payer: Self-pay | Admitting: Oncology

## 2012-07-17 NOTE — Telephone Encounter (Signed)
Chart Delivered.

## 2012-07-17 NOTE — Telephone Encounter (Signed)
Referred by Dr. Jarold Motto Dx- Lymphocytosis. NP packet mailed out.

## 2012-07-18 DIAGNOSIS — H2510 Age-related nuclear cataract, unspecified eye: Secondary | ICD-10-CM | POA: Diagnosis not present

## 2012-07-23 ENCOUNTER — Encounter: Payer: Self-pay | Admitting: Oncology

## 2012-07-23 DIAGNOSIS — D72820 Lymphocytosis (symptomatic): Secondary | ICD-10-CM | POA: Insufficient documentation

## 2012-07-24 ENCOUNTER — Other Ambulatory Visit (HOSPITAL_BASED_OUTPATIENT_CLINIC_OR_DEPARTMENT_OTHER): Payer: Medicare Other

## 2012-07-24 ENCOUNTER — Telehealth: Payer: Self-pay | Admitting: Oncology

## 2012-07-24 ENCOUNTER — Encounter: Payer: Self-pay | Admitting: Oncology

## 2012-07-24 ENCOUNTER — Ambulatory Visit (HOSPITAL_BASED_OUTPATIENT_CLINIC_OR_DEPARTMENT_OTHER): Payer: Medicare Other | Admitting: Oncology

## 2012-07-24 ENCOUNTER — Other Ambulatory Visit: Payer: Self-pay | Admitting: *Deleted

## 2012-07-24 ENCOUNTER — Ambulatory Visit: Payer: Medicare Other

## 2012-07-24 VITALS — BP 124/70 | HR 60 | Temp 97.6°F | Resp 18 | Ht 68.0 in | Wt 176.2 lb

## 2012-07-24 DIAGNOSIS — D72829 Elevated white blood cell count, unspecified: Secondary | ICD-10-CM

## 2012-07-24 DIAGNOSIS — K2270 Barrett's esophagus without dysplasia: Secondary | ICD-10-CM | POA: Diagnosis not present

## 2012-07-24 DIAGNOSIS — D72820 Lymphocytosis (symptomatic): Secondary | ICD-10-CM

## 2012-07-24 DIAGNOSIS — M899 Disorder of bone, unspecified: Secondary | ICD-10-CM

## 2012-07-24 DIAGNOSIS — M949 Disorder of cartilage, unspecified: Secondary | ICD-10-CM | POA: Diagnosis not present

## 2012-07-24 LAB — COMPREHENSIVE METABOLIC PANEL
ALT: 23 U/L (ref 0–53)
AST: 46 U/L — ABNORMAL HIGH (ref 0–37)
Albumin: 3.8 g/dL (ref 3.5–5.2)
CO2: 27 mEq/L (ref 19–32)
Calcium: 9 mg/dL (ref 8.4–10.5)
Chloride: 107 mEq/L (ref 96–112)
Creatinine, Ser: 0.97 mg/dL (ref 0.50–1.35)
Potassium: 4.6 mEq/L (ref 3.5–5.3)

## 2012-07-24 LAB — CBC WITH DIFFERENTIAL/PLATELET
BASO%: 0.8 % (ref 0.0–2.0)
Basophils Absolute: 0 10*3/uL (ref 0.0–0.1)
EOS%: 4.2 % (ref 0.0–7.0)
HCT: 41.6 % (ref 38.4–49.9)
HGB: 14.2 g/dL (ref 13.0–17.1)
MCHC: 34.3 g/dL (ref 32.0–36.0)
MONO#: 0.6 10*3/uL (ref 0.1–0.9)
NEUT%: 64.1 % (ref 39.0–75.0)
RDW: 13.7 % (ref 11.0–14.6)
WBC: 5.8 10*3/uL (ref 4.0–10.3)
lymph#: 1.2 10*3/uL (ref 0.9–3.3)

## 2012-07-24 LAB — MORPHOLOGY

## 2012-07-24 LAB — LACTATE DEHYDROGENASE: LDH: 158 U/L (ref 94–250)

## 2012-07-24 NOTE — Progress Notes (Signed)
[facility]  [phone] [phone]     INITIAL HEMATOLOGY CONSULTATION    Referral MD:  Dr. Jarome Matin, M.D.  Reason for Referral: lymphocyte-predominant leukocytosis.   HPI: [name] is a 78 year-old man with diverticulosis, Barrett's esophagus, depression, OA, osteporosis.  He has been in usual state of health.  A routine CBC was obtained by his PCP on 06/27/2012 which showed WBC 13.3,  hemoglobin 13.4, platelet count 162, with 70% lymphocyte.  Of note, on 06/21/2011 WBC 5.9 hemoglobin 15.5, platelets 162; 26% leukocyte. On 06/15/2010; history received was 5.4; hemoglobin 15.2; platelets 190; 20% leukocyte.    Mr. Ende presented to the clinic for the first time with his wife.  He has diffuse bone pain for many years from osteoarthritis. Of note Dr. Eloise Harman sent for SPEP in the past and was negative for monoclonal M spike. He denies recent use of steroid.  Patient denies fever, anorexia, weight loss, fatigue, headache, visual changes, confusion, drenching night sweats, palpable lymph node swelling, mucositis, odynophagia, dysphagia, nausea vomiting, jaundice, chest pain, palpitation, shortness of breath, dyspnea on exertion, productive cough, gum bleeding, epistaxis, hematemesis, hemoptysis, abdominal pain, abdominal swelling, early satiety, melena, hematochezia, hematuria, skin rash, spontaneous bleeding, heat or cold intolerance, bowel bladder incontinence, focal motor weakness, paresthesia, depression, suicidal or homicidal ideation, feeling hopelessness.   Past Medical History  Diagnosis Date  . Sleep apnea   . GERD (gastroesophageal reflux disease)   . Cataract   . Depression   . Hyperlipidemia   . Osteoporosis   . Barrett's esophagus   . Lymphocytosis 06/2012  . Diverticulosis   . CAD (coronary artery disease)     only on stress test; never had MI, PCI or CABG.  In 2008, LVEF was reportedly normal.   . Vitamin d  deficiency   . Diverticulosis   :    Past Surgical History  Procedure Date  . Upper gastrointestinal endoscopy   . Repair spigelian hernia 2011  . Inguinal hernia repair     x4  . Back surgury     f  . Spinal fusion 1996  :   CURRENT MEDS: Current Outpatient Prescriptions  Medication Sig Dispense Refill  . aspirin 81 MG tablet Take 81 mg by mouth daily.      Marland Kitchen buPROPion (WELLBUTRIN SR) 150 MG 12 hr tablet Take 150 mg by mouth 2 (two) times daily.       . Calcium Carbonate-Vitamin D (CALCIUM + D PO) Take 630 mg by mouth 2 (two) times daily.      . Cholecalciferol (VITAMIN D-3 PO) Take 1,000 Int'l Units by mouth daily.        . cyclobenzaprine (FLEXERIL) 5 MG tablet Take 5 mg by mouth 3 (three) times daily as needed.      . finasteride (PROSCAR) 5 MG tablet Take 5 mg by mouth daily.       . fish oil-omega-3 fatty acids 1000 MG capsule Take 1,200 mg by mouth daily.       Marland Kitchen gabapentin (NEURONTIN) 300 MG capsule Take 300 mg by mouth 2 (two) times daily as needed.       Marland Kitchen HYDROcodone-acetaminophen (VICODIN) 5-500 MG per tablet Take 1 tablet by mouth every 8 (eight) hours as needed.      . hydrOXYzine (ATARAX/VISTARIL) 50 MG tablet Take 50 mg by mouth at bedtime as needed.       . lovastatin (MEVACOR) 20 MG tablet Take 1 tablet by mouth  at bedtime.      . Multiple Vitamins-Minerals (MULTIVITAMIN WITH MINERALS) tablet Take 1 tablet by mouth daily.        . nabumetone (RELAFEN) 500 MG tablet Take 500 mg by mouth Twice daily. 2 tabs in am and 1 tab in pm as needed      . omeprazole (PRILOSEC) 40 MG capsule Take 1 capsule (40 mg total) by mouth 2 (two) times daily.  180 capsule  3  . propranolol (INDERAL) 10 MG tablet Take 10 mg by mouth Daily.      . ranitidine (ZANTAC) 300 MG tablet Take 1 tablet (300 mg total) by mouth at bedtime.  90 tablet  3  . Tamsulosin HCl (FLOMAX) 0.4 MG CAPS Take 0.4 mg by mouth daily.       . traMADol (ULTRAM) 50 MG tablet Take 100 mg by mouth 2 (two) times  daily as needed.           Allergies  Allergen Reactions  . Celebrex (Celecoxib) Other (See Comments)    Created stomach ulcers.  :  Family History  Problem Relation Age of Onset  . Prostate cancer Father   . Cancer Father     prostate  :  History   Social History  . Marital Status: Married    Spouse Name: N/A    Number of Children: 3  . Years of Education: N/A   Occupational History  .      retired Technical sales engineer   Social History Main Topics  . Smoking status: Never Smoker   . Smokeless tobacco: Never Used  . Alcohol Use: No  . Drug Use: No  . Sexually Active: Not on file   Other Topics Concern  . Not on file   Social History Narrative  . No narrative on file  :  REVIEW OF SYSTEM:  The rest of the 14-point review of sytem was negative.   Exam: ECOG 1 due to osteoarthritis pain.   General:  well-nourished in no acute distress.  Eyes:  no scleral icterus.  ENT:  There were no oropharyngeal lesions.  Neck was without thyromegaly.  Lymphatics:  Negative cervical, supraclavicular or axillary adenopathy.  Respiratory: lungs were clear bilaterally without wheezing or crackles.  Cardiovascular:  Regular rate and rhythm, S1/S2, without murmur, rub or gallop.  There was no pedal edema.  GI:  abdomen was soft, flat, nontender, nondistended, without organomegaly.  Muscoloskeletal:  no spinal tenderness of palpation of vertebral spine.  Skin exam was without echymosis, petichae.  Neuro exam was nonfocal.  Patient was able to get on and off exam table without assistance.  Gait was normal.  Patient was alerted and oriented.  Attention was good.   Language was appropriate.  Mood was normal without depression.  Speech was not pressured.  Thought content was not tangential.    LABS:  Lab Results  Component Value Date   WBC 5.8 07/24/2012   HGB 14.2 07/24/2012   HCT 41.6 07/24/2012   PLT 155 07/24/2012   GLUCOSE 76 07/24/2012   ALT 23 07/24/2012   AST 46* 07/24/2012   NA 140 07/24/2012    K 4.6 07/24/2012   CL 107 07/24/2012   CREATININE 0.97 07/24/2012   BUN 20 07/24/2012   CO2 27 07/24/2012    Blood smear review:   I personally reviewed the patient's peripheral blood smear today.  There was isocytosis.  There was no peripheral blast.  There was no schistocytosis, spherocytosis, target cell, rouleaux formation,  tear drop cell.  There was no giant platelets or platelet clumps.     ASSESSMENT AND PLAN:   1.  Leukocytosis:  Resolved.  Most likely reactive.  Given lack of constitutional symptoms, his intermittent lymphocyte predominant leukocytosis was most likely reactive and not lymphoproliferative disease such as lymphoma or myeloproliferative disease such as ALL.   2.  Barrett's esophagitis:  He thinks that his reflux symptoms are worsening lately.  I advised him to follow up with his GI physician to find out when he should have another EGD to screen for esophageal cancer especially if his leukocytosis recurs and persists.   3.  Diffuse bone pain:  Most likely osteoarthritis.  Work up in the past was negative for myeloma.   4.  Follow up:  Repeat lab here at the Cancer Center in about 4, 8, and 12 months.  If lymphocytosis recurs, we will see him back in clinic.  If not, we will discharge him in one year.     Thank you for this referral.    The length of time of the face-to-face encounter was 30 minutes. More than 50% of time was spent counseling and coordination of care.

## 2012-07-24 NOTE — Telephone Encounter (Signed)
appts made and printed for pt aom °

## 2012-07-24 NOTE — Patient Instructions (Addendum)
1.  Lyphocytosis (elevated lymphocytes; a type of white blood cell):  Resolved.  Most likely reactive.    CBC    Component Value Date/Time   WBC 5.8 07/24/2012 1030   WBC 5.6 03/18/2010 1208   RBC 4.57 07/24/2012 1030   RBC 4.59 03/18/2010 1208   HGB 14.2 07/24/2012 1030   HGB 15.0 03/18/2010 1208   HCT 41.6 07/24/2012 1030   HCT 42.9 03/18/2010 1208   PLT 155 07/24/2012 1030   PLT 135* 03/18/2010 1208   MCV 91.0 07/24/2012 1030   MCV 93.5 03/18/2010 1208   MCH 31.2 07/24/2012 1030   MCHC 34.3 07/24/2012 1030   MCHC 34.9 03/18/2010 1208   RDW 13.7 07/24/2012 1030   RDW 13.0 03/18/2010 1208   LYMPHSABS 1.2 07/24/2012 1030   MONOABS 0.6 07/24/2012 1030   EOSABS 0.2 07/24/2012 1030   BASOSABS 0.0 07/24/2012 1030    2.  Follow up:  Repeat lab here at the [facility] in about 4, 8, and 12 months.  If problem recurs, we will see you back in clinic.  If not, we will discharge you in one year.    Thank you.

## 2012-08-06 DIAGNOSIS — R351 Nocturia: Secondary | ICD-10-CM | POA: Diagnosis not present

## 2012-08-06 DIAGNOSIS — N139 Obstructive and reflux uropathy, unspecified: Secondary | ICD-10-CM | POA: Diagnosis not present

## 2012-08-06 DIAGNOSIS — R350 Frequency of micturition: Secondary | ICD-10-CM | POA: Diagnosis not present

## 2012-08-13 DIAGNOSIS — N476 Balanoposthitis: Secondary | ICD-10-CM | POA: Diagnosis not present

## 2012-08-13 DIAGNOSIS — N400 Enlarged prostate without lower urinary tract symptoms: Secondary | ICD-10-CM | POA: Diagnosis not present

## 2012-08-23 DIAGNOSIS — Z23 Encounter for immunization: Secondary | ICD-10-CM | POA: Diagnosis not present

## 2012-09-04 DIAGNOSIS — M48061 Spinal stenosis, lumbar region without neurogenic claudication: Secondary | ICD-10-CM | POA: Diagnosis not present

## 2012-09-09 ENCOUNTER — Ambulatory Visit: Payer: Medicare Other | Attending: Specialist | Admitting: Physical Therapy

## 2012-09-09 DIAGNOSIS — IMO0001 Reserved for inherently not codable concepts without codable children: Secondary | ICD-10-CM | POA: Insufficient documentation

## 2012-09-09 DIAGNOSIS — M545 Low back pain, unspecified: Secondary | ICD-10-CM | POA: Insufficient documentation

## 2012-09-09 DIAGNOSIS — M2569 Stiffness of other specified joint, not elsewhere classified: Secondary | ICD-10-CM | POA: Insufficient documentation

## 2012-09-09 DIAGNOSIS — M25569 Pain in unspecified knee: Secondary | ICD-10-CM | POA: Insufficient documentation

## 2012-09-12 ENCOUNTER — Ambulatory Visit: Payer: Medicare Other | Admitting: Physical Therapy

## 2012-09-12 DIAGNOSIS — M25569 Pain in unspecified knee: Secondary | ICD-10-CM | POA: Diagnosis not present

## 2012-09-12 DIAGNOSIS — M545 Low back pain: Secondary | ICD-10-CM | POA: Diagnosis not present

## 2012-09-12 DIAGNOSIS — IMO0001 Reserved for inherently not codable concepts without codable children: Secondary | ICD-10-CM | POA: Diagnosis not present

## 2012-09-17 ENCOUNTER — Ambulatory Visit: Payer: Medicare Other | Admitting: Physical Therapy

## 2012-09-17 DIAGNOSIS — M545 Low back pain: Secondary | ICD-10-CM | POA: Diagnosis not present

## 2012-09-17 DIAGNOSIS — M25569 Pain in unspecified knee: Secondary | ICD-10-CM | POA: Diagnosis not present

## 2012-09-17 DIAGNOSIS — IMO0001 Reserved for inherently not codable concepts without codable children: Secondary | ICD-10-CM | POA: Diagnosis not present

## 2012-09-19 ENCOUNTER — Ambulatory Visit: Payer: Medicare Other | Admitting: Physical Therapy

## 2012-09-19 DIAGNOSIS — IMO0001 Reserved for inherently not codable concepts without codable children: Secondary | ICD-10-CM | POA: Diagnosis not present

## 2012-09-19 DIAGNOSIS — M545 Low back pain: Secondary | ICD-10-CM | POA: Diagnosis not present

## 2012-09-19 DIAGNOSIS — M25569 Pain in unspecified knee: Secondary | ICD-10-CM | POA: Diagnosis not present

## 2012-09-24 ENCOUNTER — Ambulatory Visit: Payer: Medicare Other | Admitting: Physical Therapy

## 2012-09-24 DIAGNOSIS — M25569 Pain in unspecified knee: Secondary | ICD-10-CM | POA: Diagnosis not present

## 2012-09-24 DIAGNOSIS — M545 Low back pain: Secondary | ICD-10-CM | POA: Diagnosis not present

## 2012-09-24 DIAGNOSIS — IMO0001 Reserved for inherently not codable concepts without codable children: Secondary | ICD-10-CM | POA: Diagnosis not present

## 2012-09-26 ENCOUNTER — Ambulatory Visit: Payer: Medicare Other | Admitting: Physical Therapy

## 2012-09-26 DIAGNOSIS — IMO0001 Reserved for inherently not codable concepts without codable children: Secondary | ICD-10-CM | POA: Diagnosis not present

## 2012-09-26 DIAGNOSIS — M545 Low back pain: Secondary | ICD-10-CM | POA: Diagnosis not present

## 2012-09-26 DIAGNOSIS — M25569 Pain in unspecified knee: Secondary | ICD-10-CM | POA: Diagnosis not present

## 2012-10-01 ENCOUNTER — Ambulatory Visit: Payer: Medicare Other | Admitting: Physical Therapy

## 2012-10-01 DIAGNOSIS — IMO0001 Reserved for inherently not codable concepts without codable children: Secondary | ICD-10-CM | POA: Diagnosis not present

## 2012-10-01 DIAGNOSIS — M545 Low back pain: Secondary | ICD-10-CM | POA: Diagnosis not present

## 2012-10-01 DIAGNOSIS — M25569 Pain in unspecified knee: Secondary | ICD-10-CM | POA: Diagnosis not present

## 2012-10-03 ENCOUNTER — Encounter: Payer: Medicare Other | Admitting: Physical Therapy

## 2012-10-16 DIAGNOSIS — M48061 Spinal stenosis, lumbar region without neurogenic claudication: Secondary | ICD-10-CM | POA: Diagnosis not present

## 2012-11-11 ENCOUNTER — Other Ambulatory Visit: Payer: Self-pay

## 2012-11-11 MED ORDER — RANITIDINE HCL 300 MG PO TABS: 300.0000 mg | ORAL_TABLET | Freq: Every day | ORAL | Status: DC

## 2012-11-13 ENCOUNTER — Telehealth: Payer: Self-pay | Admitting: Gastroenterology

## 2012-11-13 MED ORDER — OMEPRAZOLE 40 MG PO CPDR: 40.0000 mg | DELAYED_RELEASE_CAPSULE | Freq: Two times a day (BID) | ORAL | Status: DC

## 2012-11-13 NOTE — Telephone Encounter (Signed)
Prescription sent to patient's pharmacy and no more refills until patient schedules office visit.

## 2012-11-15 DIAGNOSIS — M4120 Other idiopathic scoliosis, site unspecified: Secondary | ICD-10-CM | POA: Diagnosis not present

## 2012-11-15 DIAGNOSIS — M48061 Spinal stenosis, lumbar region without neurogenic claudication: Secondary | ICD-10-CM | POA: Diagnosis not present

## 2012-11-18 DIAGNOSIS — L570 Actinic keratosis: Secondary | ICD-10-CM | POA: Diagnosis not present

## 2012-11-18 DIAGNOSIS — D1801 Hemangioma of skin and subcutaneous tissue: Secondary | ICD-10-CM | POA: Diagnosis not present

## 2012-11-18 DIAGNOSIS — L820 Inflamed seborrheic keratosis: Secondary | ICD-10-CM | POA: Diagnosis not present

## 2012-11-18 DIAGNOSIS — L821 Other seborrheic keratosis: Secondary | ICD-10-CM | POA: Diagnosis not present

## 2012-11-19 ENCOUNTER — Other Ambulatory Visit (HOSPITAL_BASED_OUTPATIENT_CLINIC_OR_DEPARTMENT_OTHER): Payer: Medicare Other

## 2012-11-19 DIAGNOSIS — D72820 Lymphocytosis (symptomatic): Secondary | ICD-10-CM

## 2012-11-19 LAB — CBC WITH DIFFERENTIAL/PLATELET
Basophils Absolute: 0 10*3/uL (ref 0.0–0.1)
EOS%: 4.4 % (ref 0.0–7.0)
Eosinophils Absolute: 0.3 10*3/uL (ref 0.0–0.5)
HCT: 42.9 % (ref 38.4–49.9)
HGB: 14.4 g/dL (ref 13.0–17.1)
LYMPH%: 23.8 % (ref 14.0–49.0)
MCH: 30.6 pg (ref 27.2–33.4)
MCV: 91.2 fL (ref 79.3–98.0)
MONO%: 9.8 % (ref 0.0–14.0)
NEUT#: 4.1 10*3/uL (ref 1.5–6.5)
NEUT%: 61.5 % (ref 39.0–75.0)
Platelets: 144 10*3/uL (ref 140–400)

## 2012-11-20 ENCOUNTER — Telehealth: Payer: Self-pay

## 2012-11-20 NOTE — Telephone Encounter (Signed)
Message copied by Kallie Locks on Wed Nov 20, 2012  3:56 PM ------      Message from: Charles [name]      Created: Tue Nov 19, 2012 10:09 PM       Please call pt.  Inform him that his WBC is normal; not elevated.  Continue observation.  Thanks.

## 2012-11-22 ENCOUNTER — Other Ambulatory Visit (HOSPITAL_COMMUNITY): Payer: Self-pay | Admitting: Specialist

## 2012-11-22 DIAGNOSIS — M810 Age-related osteoporosis without current pathological fracture: Secondary | ICD-10-CM

## 2012-12-09 ENCOUNTER — Ambulatory Visit (HOSPITAL_COMMUNITY): Payer: Medicare Other

## 2012-12-13 ENCOUNTER — Encounter: Payer: Self-pay | Admitting: Gastroenterology

## 2012-12-13 ENCOUNTER — Ambulatory Visit (INDEPENDENT_AMBULATORY_CARE_PROVIDER_SITE_OTHER): Payer: Medicare Other | Admitting: Gastroenterology

## 2012-12-13 VITALS — BP 136/68 | HR 68 | Ht 68.0 in | Wt 177.8 lb

## 2012-12-13 DIAGNOSIS — K2270 Barrett's esophagus without dysplasia: Secondary | ICD-10-CM

## 2012-12-13 MED ORDER — OMEPRAZOLE 40 MG PO CPDR: 40.0000 mg | DELAYED_RELEASE_CAPSULE | Freq: Two times a day (BID) | ORAL | Status: DC

## 2012-12-13 MED ORDER — RANITIDINE HCL 300 MG PO TABS: 300.0000 mg | ORAL_TABLET | Freq: Every day | ORAL | Status: DC

## 2012-12-13 NOTE — Patient Instructions (Addendum)
We have sent the following medications to your pharmacy for you to pick up at your convenience: Omeprazole and Zantac.

## 2012-12-13 NOTE — Progress Notes (Signed)
History of Present Illness: This is a 79 year old male here today with his wife for followup of Barrett's esophagus. His reflux symptoms appear to be under very good control on omeprazole twice a day ranitidine at bedtime. He last underwent endoscopy in October 2012. Last colonoscopy was in September 2009. He is concerned about his bone density and has been having back pain. Denies weight loss, abdominal pain, constipation, diarrhea, change in stool caliber, melena, hematochezia, nausea, vomiting, dysphagia, reflux symptoms, chest pain.  Current Medications, Allergies, Past Medical History, Past Surgical History, Family History and Social History were reviewed in [employer] record.  Physical Exam: General: Well developed , well nourished, no acute distress Head: Normocephalic and atraumatic Eyes:  sclerae anicteric, EOMI Ears: Normal auditory acuity Mouth: No deformity or lesions Lungs: Clear throughout to auscultation Heart: Regular rate and rhythm; no murmurs, rubs or bruits Abdomen: Soft, non tender and non distended. No masses, hepatosplenomegaly or hernias noted. Normal Bowel sounds Musculoskeletal: Symmetrical with no gross deformities  Pulses:  Normal pulses noted Extremities: No clubbing, cyanosis, edema or deformities noted Neurological: Alert oriented x 4, grossly nonfocal Psychological:  Alert and cooperative. Mildly agitated, depressed affect   Assessment and Recommendations:  1. Barrett's esophagus. Reflux symptoms controlled with omeprazole 40 mg twice a day and ranitidine 300 mg at bedtime. Continue current. Continue standard antireflux measures. Consider surveillance endoscopy in October 2015 however he will be over 82 at that time so we will consider discontinuing surveillance.  2. Colorectal cancer screening. Last colonoscopy 2009 was unremarkable. No plans for future screening colonoscopies.  3. Osteoporosis and back pain. He is interested in repeat  bone density scan. I advised him to contact Dr. Ivery Quale for further advice and management of this problem.

## 2012-12-18 ENCOUNTER — Other Ambulatory Visit (HOSPITAL_COMMUNITY): Payer: Self-pay | Admitting: Specialist

## 2012-12-18 DIAGNOSIS — M810 Age-related osteoporosis without current pathological fracture: Secondary | ICD-10-CM

## 2012-12-20 ENCOUNTER — Ambulatory Visit (HOSPITAL_COMMUNITY)
Admission: RE | Admit: 2012-12-20 | Discharge: 2012-12-20 | Disposition: A | Discharge status: Home / Self Care | Payer: Medicare Other | Source: Ambulatory Visit | Attending: Specialist | Admitting: Specialist

## 2012-12-20 DIAGNOSIS — M810 Age-related osteoporosis without current pathological fracture: Secondary | ICD-10-CM | POA: Diagnosis not present

## 2013-01-06 DIAGNOSIS — M48061 Spinal stenosis, lumbar region without neurogenic claudication: Secondary | ICD-10-CM | POA: Diagnosis not present

## 2013-01-06 DIAGNOSIS — M4120 Other idiopathic scoliosis, site unspecified: Secondary | ICD-10-CM | POA: Diagnosis not present

## 2013-01-07 ENCOUNTER — Other Ambulatory Visit: Payer: Self-pay | Admitting: Specialist

## 2013-01-07 DIAGNOSIS — M545 Low back pain: Secondary | ICD-10-CM

## 2013-01-10 ENCOUNTER — Ambulatory Visit
Admission: RE | Admit: 2013-01-10 | Discharge: 2013-01-10 | Disposition: A | Discharge status: Home / Self Care | Payer: Medicare Other | Source: Ambulatory Visit | Attending: Specialist | Admitting: Specialist

## 2013-01-10 DIAGNOSIS — M5137 Other intervertebral disc degeneration, lumbosacral region: Secondary | ICD-10-CM | POA: Diagnosis not present

## 2013-01-10 DIAGNOSIS — M47817 Spondylosis without myelopathy or radiculopathy, lumbosacral region: Secondary | ICD-10-CM | POA: Diagnosis not present

## 2013-01-10 DIAGNOSIS — M545 Low back pain: Secondary | ICD-10-CM

## 2013-01-22 DIAGNOSIS — Z79899 Other long term (current) drug therapy: Secondary | ICD-10-CM | POA: Diagnosis not present

## 2013-02-05 ENCOUNTER — Other Ambulatory Visit (HOSPITAL_COMMUNITY): Payer: Self-pay | Admitting: Internal Medicine

## 2013-02-05 DIAGNOSIS — M4120 Other idiopathic scoliosis, site unspecified: Secondary | ICD-10-CM | POA: Diagnosis not present

## 2013-02-11 ENCOUNTER — Encounter (HOSPITAL_COMMUNITY): Payer: Self-pay

## 2013-02-11 ENCOUNTER — Ambulatory Visit (HOSPITAL_COMMUNITY)
Admission: RE | Admit: 2013-02-11 | Discharge: 2013-02-11 | Disposition: A | Discharge status: Home / Self Care | Payer: Medicare Other | Source: Ambulatory Visit | Attending: Internal Medicine | Admitting: Internal Medicine

## 2013-02-11 DIAGNOSIS — M810 Age-related osteoporosis without current pathological fracture: Secondary | ICD-10-CM | POA: Insufficient documentation

## 2013-02-11 HISTORY — DX: Cardiac murmur, unspecified: R01.1

## 2013-02-11 MED ORDER — SODIUM CHLORIDE 0.9 % IV SOLN
INTRAVENOUS | Status: DC
  Administered 2013-02-11: 11:00:00 via INTRAVENOUS

## 2013-02-11 MED ORDER — ZOLEDRONIC ACID 5 MG/100ML IV SOLN
5.0000 mg | Freq: Once | INTRAVENOUS | Status: AC
  Administered 2013-02-11: 5 mg via INTRAVENOUS
  Filled 2013-02-11: qty 100

## 2013-02-11 NOTE — Progress Notes (Signed)
Patient tolerated Reclast well. No signs of adverse reaction noted. Verbalized understanding of discharge instructions. Instructed to Dr. Eloise Harman for concerns following discharge.

## 2013-02-24 DIAGNOSIS — H2510 Age-related nuclear cataract, unspecified eye: Secondary | ICD-10-CM | POA: Diagnosis not present

## 2013-03-03 DIAGNOSIS — H269 Unspecified cataract: Secondary | ICD-10-CM | POA: Diagnosis not present

## 2013-03-03 DIAGNOSIS — H2589 Other age-related cataract: Secondary | ICD-10-CM | POA: Diagnosis not present

## 2013-03-03 DIAGNOSIS — H2510 Age-related nuclear cataract, unspecified eye: Secondary | ICD-10-CM | POA: Diagnosis not present

## 2013-03-10 DIAGNOSIS — H35359 Cystoid macular degeneration, unspecified eye: Secondary | ICD-10-CM | POA: Diagnosis not present

## 2013-03-18 ENCOUNTER — Other Ambulatory Visit (HOSPITAL_BASED_OUTPATIENT_CLINIC_OR_DEPARTMENT_OTHER): Payer: Medicare Other | Admitting: Lab

## 2013-03-18 DIAGNOSIS — D72820 Lymphocytosis (symptomatic): Secondary | ICD-10-CM

## 2013-03-18 LAB — CBC WITH DIFFERENTIAL/PLATELET
EOS%: 4 % (ref 0.0–7.0)
Eosinophils Absolute: 0.3 10*3/uL (ref 0.0–0.5)
LYMPH%: 23.8 % (ref 14.0–49.0)
MCH: 30.4 pg (ref 27.2–33.4)
MCHC: 33.8 g/dL (ref 32.0–36.0)
MCV: 89.8 fL (ref 79.3–98.0)
MONO%: 9.3 % (ref 0.0–14.0)
NEUT#: 4.1 10*3/uL (ref 1.5–6.5)
Platelets: 153 10*3/uL (ref 140–400)
RBC: 4.83 10*6/uL (ref 4.20–5.82)
RDW: 13.4 % (ref 11.0–14.6)

## 2013-03-19 ENCOUNTER — Encounter: Payer: Self-pay | Admitting: Oncology

## 2013-03-27 ENCOUNTER — Encounter (HOSPITAL_COMMUNITY): Payer: Self-pay | Admitting: Pharmacy Technician

## 2013-03-31 ENCOUNTER — Other Ambulatory Visit (HOSPITAL_COMMUNITY): Payer: Self-pay

## 2013-03-31 ENCOUNTER — Other Ambulatory Visit (HOSPITAL_COMMUNITY): Payer: Self-pay | Admitting: Specialist

## 2013-03-31 ENCOUNTER — Encounter (HOSPITAL_COMMUNITY)
Admission: RE | Admit: 2013-03-31 | Discharge: 2013-03-31 | Disposition: A | Discharge status: Home / Self Care | Payer: Medicare Other | Source: Ambulatory Visit | Attending: Specialist | Admitting: Specialist

## 2013-03-31 ENCOUNTER — Encounter (HOSPITAL_COMMUNITY): Payer: Self-pay

## 2013-03-31 DIAGNOSIS — G9741 Accidental puncture or laceration of dura during a procedure: Secondary | ICD-10-CM | POA: Diagnosis not present

## 2013-03-31 DIAGNOSIS — E559 Vitamin D deficiency, unspecified: Secondary | ICD-10-CM | POA: Diagnosis present

## 2013-03-31 DIAGNOSIS — G4733 Obstructive sleep apnea (adult) (pediatric): Secondary | ICD-10-CM | POA: Diagnosis not present

## 2013-03-31 DIAGNOSIS — I10 Essential (primary) hypertension: Secondary | ICD-10-CM | POA: Diagnosis not present

## 2013-03-31 DIAGNOSIS — M4150 Other secondary scoliosis, site unspecified: Secondary | ICD-10-CM | POA: Diagnosis not present

## 2013-03-31 DIAGNOSIS — R0989 Other specified symptoms and signs involving the circulatory and respiratory systems: Secondary | ICD-10-CM | POA: Diagnosis not present

## 2013-03-31 DIAGNOSIS — M5137 Other intervertebral disc degeneration, lumbosacral region: Secondary | ICD-10-CM | POA: Diagnosis present

## 2013-03-31 DIAGNOSIS — I2510 Atherosclerotic heart disease of native coronary artery without angina pectoris: Secondary | ICD-10-CM | POA: Diagnosis present

## 2013-03-31 DIAGNOSIS — K5730 Diverticulosis of large intestine without perforation or abscess without bleeding: Secondary | ICD-10-CM | POA: Diagnosis present

## 2013-03-31 DIAGNOSIS — N400 Enlarged prostate without lower urinary tract symptoms: Secondary | ICD-10-CM | POA: Diagnosis not present

## 2013-03-31 DIAGNOSIS — M479 Spondylosis, unspecified: Secondary | ICD-10-CM | POA: Diagnosis not present

## 2013-03-31 DIAGNOSIS — M4120 Other idiopathic scoliosis, site unspecified: Secondary | ICD-10-CM | POA: Diagnosis not present

## 2013-03-31 DIAGNOSIS — M539 Dorsopathy, unspecified: Secondary | ICD-10-CM | POA: Diagnosis not present

## 2013-03-31 DIAGNOSIS — Z8042 Family history of malignant neoplasm of prostate: Secondary | ICD-10-CM | POA: Diagnosis not present

## 2013-03-31 DIAGNOSIS — M810 Age-related osteoporosis without current pathological fracture: Secondary | ICD-10-CM | POA: Diagnosis present

## 2013-03-31 DIAGNOSIS — M549 Dorsalgia, unspecified: Secondary | ICD-10-CM | POA: Diagnosis not present

## 2013-03-31 DIAGNOSIS — R4182 Altered mental status, unspecified: Secondary | ICD-10-CM | POA: Diagnosis not present

## 2013-03-31 DIAGNOSIS — Z4682 Encounter for fitting and adjustment of non-vascular catheter: Secondary | ICD-10-CM | POA: Diagnosis not present

## 2013-03-31 DIAGNOSIS — R7309 Other abnormal glucose: Secondary | ICD-10-CM | POA: Diagnosis not present

## 2013-03-31 DIAGNOSIS — D62 Acute posthemorrhagic anemia: Secondary | ICD-10-CM | POA: Diagnosis not present

## 2013-03-31 DIAGNOSIS — M329 Systemic lupus erythematosus, unspecified: Secondary | ICD-10-CM | POA: Diagnosis not present

## 2013-03-31 DIAGNOSIS — K219 Gastro-esophageal reflux disease without esophagitis: Secondary | ICD-10-CM | POA: Diagnosis not present

## 2013-03-31 DIAGNOSIS — M25819 Other specified joint disorders, unspecified shoulder: Secondary | ICD-10-CM | POA: Diagnosis present

## 2013-03-31 DIAGNOSIS — Z981 Arthrodesis status: Secondary | ICD-10-CM | POA: Diagnosis not present

## 2013-03-31 DIAGNOSIS — Z79899 Other long term (current) drug therapy: Secondary | ICD-10-CM | POA: Diagnosis not present

## 2013-03-31 DIAGNOSIS — J95821 Acute postprocedural respiratory failure: Secondary | ICD-10-CM | POA: Diagnosis not present

## 2013-03-31 DIAGNOSIS — M48061 Spinal stenosis, lumbar region without neurogenic claudication: Secondary | ICD-10-CM | POA: Diagnosis not present

## 2013-03-31 DIAGNOSIS — J9600 Acute respiratory failure, unspecified whether with hypoxia or hypercapnia: Secondary | ICD-10-CM | POA: Diagnosis not present

## 2013-03-31 DIAGNOSIS — Z7982 Long term (current) use of aspirin: Secondary | ICD-10-CM | POA: Diagnosis not present

## 2013-03-31 DIAGNOSIS — F329 Major depressive disorder, single episode, unspecified: Secondary | ICD-10-CM | POA: Diagnosis present

## 2013-03-31 DIAGNOSIS — Z6828 Body mass index (BMI) 28.0-28.9, adult: Secondary | ICD-10-CM | POA: Diagnosis not present

## 2013-03-31 DIAGNOSIS — E785 Hyperlipidemia, unspecified: Secondary | ICD-10-CM | POA: Diagnosis present

## 2013-03-31 DIAGNOSIS — Q762 Congenital spondylolisthesis: Secondary | ICD-10-CM | POA: Diagnosis not present

## 2013-03-31 DIAGNOSIS — Z01812 Encounter for preprocedural laboratory examination: Secondary | ICD-10-CM | POA: Diagnosis not present

## 2013-03-31 HISTORY — DX: Constipation, unspecified: K59.00

## 2013-03-31 HISTORY — DX: Retention of urine, unspecified: R33.9

## 2013-03-31 LAB — COMPREHENSIVE METABOLIC PANEL WITH GFR
ALT: 24 U/L (ref 0–53)
AST: 53 U/L — ABNORMAL HIGH (ref 0–37)
Albumin: 3.6 g/dL (ref 3.5–5.2)
Alkaline Phosphatase: 73 U/L (ref 39–117)
BUN: 22 mg/dL (ref 6–23)
CO2: 25 meq/L (ref 19–32)
Calcium: 9.1 mg/dL (ref 8.4–10.5)
Chloride: 105 meq/L (ref 96–112)
Creatinine, Ser: 0.92 mg/dL (ref 0.50–1.35)
GFR calc Af Amer: >90 mL/min
GFR calc non Af Amer: 78 mL/min — ABNORMAL LOW
Glucose, Bld: 73 mg/dL (ref 70–99)
Potassium: 4.1 meq/L (ref 3.5–5.1)
Sodium: 137 meq/L (ref 135–145)
Total Bilirubin: 0.3 mg/dL (ref 0.3–1.2)
Total Protein: 6.5 g/dL (ref 6.0–8.3)

## 2013-03-31 LAB — URINALYSIS, ROUTINE W REFLEX MICROSCOPIC
Bilirubin Urine: NEGATIVE
Glucose, UA: NEGATIVE mg/dL
Hgb urine dipstick: NEGATIVE
Ketones, ur: NEGATIVE mg/dL
Leukocytes, UA: NEGATIVE
Nitrite: NEGATIVE
Protein, ur: NEGATIVE mg/dL
Specific Gravity, Urine: 1.014 (ref 1.005–1.030)
Urobilinogen, UA: 0.2 mg/dL (ref 0.0–1.0)
pH: 6.5 (ref 5.0–8.0)

## 2013-03-31 LAB — CBC
HCT: 35.3 % — ABNORMAL LOW (ref 39.0–52.0)
Hemoglobin: 12.2 g/dL — ABNORMAL LOW (ref 13.0–17.0)
MCH: 29.5 pg (ref 26.0–34.0)
MCHC: 34.6 g/dL (ref 30.0–36.0)
MCV: 85.5 fL (ref 78.0–100.0)
Platelets: 175 10*3/uL (ref 150–400)
RBC: 4.13 MIL/uL — ABNORMAL LOW (ref 4.22–5.81)
RDW: 13.2 % (ref 11.5–15.5)
WBC: 6.1 10*3/uL (ref 4.0–10.5)

## 2013-03-31 LAB — PROTIME-INR: INR: 1.01 (ref 0.00–1.49)

## 2013-03-31 LAB — APTT: aPTT: 31 s (ref 24–37)

## 2013-04-03 HISTORY — PX: BACK SURGERY: SHX140

## 2013-04-07 MED ORDER — CEFAZOLIN SODIUM-DEXTROSE 2-3 GM-% IV SOLR
2.0000 g | INTRAVENOUS | Status: AC
  Administered 2013-04-08: 2 g via INTRAVENOUS
  Filled 2013-04-07: qty 50

## 2013-04-07 NOTE — H&P (Signed)
Charles [name] is an 79 y.o. male.   Chief Complaint: low back pain.  Right >left leg pain and weakness. HPI: Pt with collapsing degenerative scoliosis of the lumbar spine with the apex to the left side at the L3-4 level, fused segment at L5-S1 and degeneration of the L2-3 and L3-4 levels with large spurs over the left side at both L2-3 and L3-4.  Has been followed for several years for treatment of his back and leg pain.  L5-S1 was fused in 1996 secondary to spondylolisthesis.  Pt continues to suffer with persistent back pain and progressive weakness in his legs with standing and ambulating.  Conservative treatment has been used including physical therapy, ESIs, pain meds and activity modification. Pt now wishes to proceed with surgical intervention. Most recent myelogram and CT showed persistent right sided changes with large foraminal and extra foraminal protrusion.  Right side lateral subluxation of L3 on L4  And Schmorls nodes at L4-5.  Pt wishes to proceed with surgical intervention for right TLIF L2-3, L3-4 and L4-5 with removal of hardware L5-S1 with segmental fixation L2-S1 with interbody fusion,  Past Medical History  Diagnosis Date  . GERD (gastroesophageal reflux disease)   . Cataract   . Depression   . Hyperlipidemia   . Osteoporosis   . Barrett's esophagus   . Lymphocytosis 06/2012  . Diverticulosis   . Vitamin D deficiency   . Diverticulosis   . BPH (benign prostatic hyperplasia)   . Murmur   . Constipation   . Arthritis   . Urinary retention with incomplete bladder emptying     pt uses bathroom and within 30 mins needs to go again  . Sleep apnea     wears CPAP  . CAD (coronary artery disease)     only on stress test; never had MI, PCI or CABG.  In 2008, LVEF was reportedly normal.     Past Surgical History  Procedure Laterality Date  . Upper gastrointestinal endoscopy    . Repair spigelian hernia  2011  . Inguinal hernia repair      x4  . Back surgury      f   . Spinal fusion  1996  . Finger surgery Right     right thumb  . Colonoscopy    . Eye surgery Bilateral     Family History  Problem Relation Age of Onset  . Prostate cancer Father   . CAD Mother    Social History:  reports that he has never smoked. He has never used smokeless tobacco. He reports that he does not drink alcohol or use illicit drugs.  Allergies:  Allergies  Allergen Reactions  . Celebrex (Celecoxib) Other (See Comments)    Created stomach ulcers.    Medications Prior to Admission  Medication Sig Dispense Refill  . aspirin EC 81 MG tablet Take 81 mg by mouth daily.      Marland Kitchen buPROPion (WELLBUTRIN SR) 150 MG 12 hr tablet Take 150 mg by mouth 2 (two) times daily.       . calcium-vitamin D (OSCAL WITH D) 500-200 MG-UNIT per tablet Take 1 tablet by mouth 2 (two) times daily.      . cholecalciferol (VITAMIN D) 1000 UNITS tablet Take 1,000 Units by mouth daily.      . finasteride (PROSCAR) 5 MG tablet Take 5 mg by mouth daily.       . fish oil-omega-3 fatty acids 1000 MG capsule Take 1 g by mouth daily.       Marland Kitchen  HYDROcodone-acetaminophen (NORCO/VICODIN) 5-325 MG per tablet Take 0.5-1 tablets by mouth every 6 (six) hours as needed for pain.      . hydrOXYzine (ATARAX/VISTARIL) 50 MG tablet Take 50 mg by mouth at bedtime as needed (sleep).       . lovastatin (MEVACOR) 20 MG tablet Take 1 tablet by mouth at bedtime.      . Multiple Vitamins-Minerals (MULTIVITAMIN WITH MINERALS) tablet Take 1 tablet by mouth daily.        . nabumetone (RELAFEN) 500 MG tablet Take 500-1,000 mg by mouth 2 (two) times daily as needed for pain. 2 tabs in am and 1 tab in pm      . omeprazole (PRILOSEC) 40 MG capsule Take 1 capsule (40 mg total) by mouth 2 (two) times daily.  60 capsule  11  . ranitidine (ZANTAC) 300 MG tablet Take 1 tablet (300 mg total) by mouth at bedtime.  30 tablet  11  . Tamsulosin HCl (FLOMAX) 0.4 MG CAPS Take 0.4 mg by mouth daily.       . traMADol (ULTRAM) 50 MG tablet Take  100 mg by mouth 2 (two) times daily as needed for pain.       . cyclobenzaprine (FLEXERIL) 5 MG tablet Take 5 mg by mouth 3 (three) times daily as needed.        No results found for this or any previous visit (from the past 48 hour(s)). No results found.  Review of Systems  Constitutional: Negative.   HENT: Negative.   Eyes: Negative.   Respiratory: Negative.   Cardiovascular: Negative.   Gastrointestinal: Negative.   Genitourinary:       History of post op urinary retention  Musculoskeletal: Positive for back pain.  Skin: Negative.   Neurological: Positive for focal weakness.       Lower extremities  Endo/Heme/Allergies: Negative.   Psychiatric/Behavioral: Negative.     Blood pressure 109/47, pulse 52, temperature 97.9 F (36.6 C), temperature source Oral, resp. rate 20, SpO2 96.00%. Physical Exam  Constitutional: He is oriented to person, place, and time. He appears well-developed and well-nourished.  HENT:  Head: Normocephalic and atraumatic.  Eyes: EOM are normal. Pupils are equal, round, and reactive to light.  Neck: Normal range of motion.  Cardiovascular: Normal rate, regular rhythm and normal heart sounds.   Respiratory: Effort normal and breath sounds normal.  GI: Soft.  Musculoskeletal:  Weakness with right foot dorsiflexion and right knee extension.  DTRs 0 on right and 2+ on left.  At the knee 2+ and symmetric.  Neurological: He is alert and oriented to person, place, and time.  Skin: Skin is warm and dry.  Psychiatric: He has a normal mood and affect.  Most recent studies demonstrate a collapsing degenerative scoliosis above L5-S1 fusion (L5-S1 fusion with Steffee plates and pedicle screws) apex to the left with right foramenal and lateral recess stenosis L3-4 and L4-5. Stable vertebral segment is L2. Right lateral listhesis at L4-5. Overall Kyphosis associated with the levels of scoliosis.  Assessment/Plan Collapsing degenerative lumbar scoliosis .  Right  foraminal stenosis L4-5 and L2-3  PLAN:   Right transforaminal lumbar interbody fusion L2-3, L3-4 and L4-5 with cages.  removal of hardware L5-S1.  Segmental fixation L2-S1 with pedicle screws and rod fixation.  Local bone graft. Patient was seen and examined in the preop holding area. There has been no interval  Change in this patient's exam preop  history and physical exam  Lab tests and images have been  examined and reviewed.  The Risks benefits and alternative treatments have been discussed  extensively,questions answered.  The patient has elected to undergo the discussed surgical treatment.  NITKA,JAMES E 04/08/2013, 7:58 AM

## 2013-04-08 ENCOUNTER — Inpatient Hospital Stay (HOSPITAL_COMMUNITY): Payer: Medicare Other | Admitting: Critical Care Medicine

## 2013-04-08 ENCOUNTER — Inpatient Hospital Stay (HOSPITAL_COMMUNITY): Payer: Medicare Other

## 2013-04-08 ENCOUNTER — Encounter (HOSPITAL_COMMUNITY)
Admission: RE | Disposition: A | Discharge status: Skilled Nursing Facility | Payer: Self-pay | Source: Ambulatory Visit | Attending: Specialist

## 2013-04-08 ENCOUNTER — Inpatient Hospital Stay (HOSPITAL_COMMUNITY)
Admission: RE | Admit: 2013-04-08 | Discharge: 2013-04-14 | DRG: 456 | Disposition: A | Discharge status: Skilled Nursing Facility | Payer: Medicare Other | Source: Ambulatory Visit | Attending: Specialist | Admitting: Specialist

## 2013-04-08 ENCOUNTER — Encounter (HOSPITAL_COMMUNITY): Payer: Self-pay | Admitting: Critical Care Medicine

## 2013-04-08 DIAGNOSIS — M48061 Spinal stenosis, lumbar region without neurogenic claudication: Secondary | ICD-10-CM | POA: Diagnosis not present

## 2013-04-08 DIAGNOSIS — M5137 Other intervertebral disc degeneration, lumbosacral region: Secondary | ICD-10-CM | POA: Diagnosis present

## 2013-04-08 DIAGNOSIS — J9600 Acute respiratory failure, unspecified whether with hypoxia or hypercapnia: Secondary | ICD-10-CM | POA: Diagnosis not present

## 2013-04-08 DIAGNOSIS — M25819 Other specified joint disorders, unspecified shoulder: Secondary | ICD-10-CM | POA: Diagnosis present

## 2013-04-08 DIAGNOSIS — E785 Hyperlipidemia, unspecified: Secondary | ICD-10-CM | POA: Diagnosis present

## 2013-04-08 DIAGNOSIS — G9741 Accidental puncture or laceration of dura during a procedure: Secondary | ICD-10-CM | POA: Diagnosis not present

## 2013-04-08 DIAGNOSIS — R4182 Altered mental status, unspecified: Secondary | ICD-10-CM | POA: Diagnosis not present

## 2013-04-08 DIAGNOSIS — N400 Enlarged prostate without lower urinary tract symptoms: Secondary | ICD-10-CM | POA: Diagnosis present

## 2013-04-08 DIAGNOSIS — R7309 Other abnormal glucose: Secondary | ICD-10-CM | POA: Diagnosis not present

## 2013-04-08 DIAGNOSIS — G4733 Obstructive sleep apnea (adult) (pediatric): Secondary | ICD-10-CM | POA: Diagnosis not present

## 2013-04-08 DIAGNOSIS — E559 Vitamin D deficiency, unspecified: Secondary | ICD-10-CM | POA: Diagnosis present

## 2013-04-08 DIAGNOSIS — I10 Essential (primary) hypertension: Secondary | ICD-10-CM | POA: Diagnosis not present

## 2013-04-08 DIAGNOSIS — Z8042 Family history of malignant neoplasm of prostate: Secondary | ICD-10-CM

## 2013-04-08 DIAGNOSIS — IMO0002 Reserved for concepts with insufficient information to code with codable children: Secondary | ICD-10-CM | POA: Diagnosis not present

## 2013-04-08 DIAGNOSIS — D62 Acute posthemorrhagic anemia: Secondary | ICD-10-CM | POA: Diagnosis not present

## 2013-04-08 DIAGNOSIS — Z79899 Other long term (current) drug therapy: Secondary | ICD-10-CM

## 2013-04-08 DIAGNOSIS — M419 Scoliosis, unspecified: Secondary | ICD-10-CM | POA: Diagnosis present

## 2013-04-08 DIAGNOSIS — Z981 Arthrodesis status: Secondary | ICD-10-CM

## 2013-04-08 DIAGNOSIS — M810 Age-related osteoporosis without current pathological fracture: Secondary | ICD-10-CM | POA: Diagnosis present

## 2013-04-08 DIAGNOSIS — M7541 Impingement syndrome of right shoulder: Secondary | ICD-10-CM | POA: Diagnosis not present

## 2013-04-08 DIAGNOSIS — M549 Dorsalgia, unspecified: Secondary | ICD-10-CM | POA: Diagnosis not present

## 2013-04-08 DIAGNOSIS — M4120 Other idiopathic scoliosis, site unspecified: Secondary | ICD-10-CM | POA: Diagnosis not present

## 2013-04-08 DIAGNOSIS — Z8249 Family history of ischemic heart disease and other diseases of the circulatory system: Secondary | ICD-10-CM

## 2013-04-08 DIAGNOSIS — Z4682 Encounter for fitting and adjustment of non-vascular catheter: Secondary | ICD-10-CM | POA: Diagnosis not present

## 2013-04-08 DIAGNOSIS — J95821 Acute postprocedural respiratory failure: Secondary | ICD-10-CM | POA: Diagnosis not present

## 2013-04-08 DIAGNOSIS — Z7982 Long term (current) use of aspirin: Secondary | ICD-10-CM

## 2013-04-08 DIAGNOSIS — M47817 Spondylosis without myelopathy or radiculopathy, lumbosacral region: Secondary | ICD-10-CM | POA: Diagnosis present

## 2013-04-08 DIAGNOSIS — M4316 Spondylolisthesis, lumbar region: Secondary | ICD-10-CM | POA: Diagnosis present

## 2013-04-08 DIAGNOSIS — Z6828 Body mass index (BMI) 28.0-28.9, adult: Secondary | ICD-10-CM

## 2013-04-08 DIAGNOSIS — F3289 Other specified depressive episodes: Secondary | ICD-10-CM | POA: Diagnosis present

## 2013-04-08 DIAGNOSIS — K219 Gastro-esophageal reflux disease without esophagitis: Secondary | ICD-10-CM | POA: Diagnosis present

## 2013-04-08 DIAGNOSIS — M539 Dorsopathy, unspecified: Secondary | ICD-10-CM | POA: Diagnosis not present

## 2013-04-08 DIAGNOSIS — K5730 Diverticulosis of large intestine without perforation or abscess without bleeding: Secondary | ICD-10-CM | POA: Diagnosis present

## 2013-04-08 DIAGNOSIS — F329 Major depressive disorder, single episode, unspecified: Secondary | ICD-10-CM | POA: Diagnosis present

## 2013-04-08 DIAGNOSIS — I2510 Atherosclerotic heart disease of native coronary artery without angina pectoris: Secondary | ICD-10-CM | POA: Diagnosis present

## 2013-04-08 DIAGNOSIS — M51379 Other intervertebral disc degeneration, lumbosacral region without mention of lumbar back pain or lower extremity pain: Secondary | ICD-10-CM | POA: Diagnosis present

## 2013-04-08 DIAGNOSIS — Z01812 Encounter for preprocedural laboratory examination: Secondary | ICD-10-CM

## 2013-04-08 DIAGNOSIS — Q762 Congenital spondylolisthesis: Secondary | ICD-10-CM

## 2013-04-08 DIAGNOSIS — R0989 Other specified symptoms and signs involving the circulatory and respiratory systems: Secondary | ICD-10-CM | POA: Diagnosis not present

## 2013-04-08 LAB — CBC
HCT: 32 % — ABNORMAL LOW (ref 39.0–52.0)
Hemoglobin: 10.7 g/dL — ABNORMAL LOW (ref 13.0–17.0)
MCH: 29.5 pg (ref 26.0–34.0)
MCH: 29.5 pg (ref 26.0–34.0)
MCHC: 35.9 g/dL (ref 30.0–36.0)
MCV: 82.1 fL (ref 78.0–100.0)
Platelets: 95 10*3/uL — ABNORMAL LOW (ref 150–400)
Platelets: 93 10*3/uL — ABNORMAL LOW (ref 150–400)
RBC: 3.63 MIL/uL — ABNORMAL LOW (ref 4.22–5.81)
RDW: 13.3 % (ref 11.5–15.5)
WBC: 11.2 10*3/uL — ABNORMAL HIGH (ref 4.0–10.5)
WBC: 8.2 10*3/uL (ref 4.0–10.5)

## 2013-04-08 LAB — POCT I-STAT 4, (NA,K, GLUC, HGB,HCT)
Glucose, Bld: 93 mg/dL (ref 70–99)
Glucose, Bld: 142 mg/dL — ABNORMAL HIGH (ref 70–99)
HCT: 21 % — ABNORMAL LOW (ref 39.0–52.0)
Hemoglobin: 7.1 g/dL — ABNORMAL LOW (ref 13.0–17.0)
Hemoglobin: 11.9 g/dL — ABNORMAL LOW (ref 13.0–17.0)
Potassium: 4.4 mEq/L (ref 3.5–5.1)
Sodium: 140 mEq/L (ref 135–145)

## 2013-04-08 LAB — COMPREHENSIVE METABOLIC PANEL
ALT: 20 U/L (ref 0–53)
AST: 57 U/L — ABNORMAL HIGH (ref 0–37)
Alkaline Phosphatase: 49 U/L (ref 39–117)
CO2: 23 mEq/L (ref 19–32)
Chloride: 105 mEq/L (ref 96–112)
GFR calc non Af Amer: >90 mL/min (ref >90)
Glucose, Bld: 153 mg/dL — ABNORMAL HIGH (ref 70–99)
Sodium: 135 mEq/L (ref 135–145)
Total Bilirubin: 1.1 mg/dL (ref 0.3–1.2)

## 2013-04-08 LAB — POCT I-STAT 3, ART BLOOD GAS (G3+)
Acid-base deficit: 3 mmol/L — ABNORMAL HIGH (ref 0.0–2.0)
O2 Saturation: 98 %
Patient temperature: 96.3
TCO2: 24 mmol/L (ref 0–100)

## 2013-04-08 LAB — BASIC METABOLIC PANEL
CO2: 23 mEq/L (ref 19–32)
Chloride: 104 mEq/L (ref 96–112)
Creatinine, Ser: 0.63 mg/dL (ref 0.50–1.35)
Potassium: 3.7 mEq/L (ref 3.5–5.1)

## 2013-04-08 LAB — TROPONIN I: Troponin I: <0.3 ng/mL (ref <0.30)

## 2013-04-08 LAB — APTT: aPTT: 30 seconds (ref 24–37)

## 2013-04-08 LAB — GLUCOSE, CAPILLARY: Glucose-Capillary: 144 mg/dL — ABNORMAL HIGH (ref 70–99)

## 2013-04-08 LAB — PREPARE RBC (CROSSMATCH)

## 2013-04-08 LAB — LACTIC ACID, PLASMA: Lactic Acid, Venous: 1.2 mmol/L (ref 0.5–2.2)

## 2013-04-08 SURGERY — POSTERIOR LUMBAR FUSION 3 WITH HARDWARE REMOVAL
Anesthesia: General | Site: Back | Wound class: Clean

## 2013-04-08 MED ORDER — ROCURONIUM BROMIDE 100 MG/10ML IV SOLN
INTRAVENOUS | Status: DC | PRN
  Administered 2013-04-08 (×2): 10 mg via INTRAVENOUS
  Administered 2013-04-08: 30 mg via INTRAVENOUS
  Administered 2013-04-08: 20 mg via INTRAVENOUS
  Administered 2013-04-08: 30 mg via INTRAVENOUS
  Administered 2013-04-08: 10 mg via INTRAVENOUS
  Administered 2013-04-08 (×2): 20 mg via INTRAVENOUS
  Administered 2013-04-08: 50 mg via INTRAVENOUS

## 2013-04-08 MED ORDER — METHOCARBAMOL 100 MG/ML IJ SOLN
500.0000 mg | Freq: Four times a day (QID) | INTRAVENOUS | Status: DC | PRN
  Administered 2013-04-09 (×3): 500 mg via INTRAVENOUS
  Filled 2013-04-08 (×3): qty 5

## 2013-04-08 MED ORDER — GLYCOPYRROLATE 0.2 MG/ML IJ SOLN
INTRAMUSCULAR | Status: DC | PRN
  Administered 2013-04-08: 0.2 mg via INTRAVENOUS
  Administered 2013-04-08: 0.1 mg via INTRAVENOUS
  Administered 2013-04-08 (×3): 0.2 mg via INTRAVENOUS
  Administered 2013-04-08: 0.1 mg via INTRAVENOUS

## 2013-04-08 MED ORDER — INSULIN ASPART 100 UNIT/ML ~~LOC~~ SOLN
0.0000 [IU] | SUBCUTANEOUS | Status: DC
  Administered 2013-04-08 – 2013-04-09 (×3): 1 [IU] via SUBCUTANEOUS

## 2013-04-08 MED ORDER — ARTIFICIAL TEARS OP OINT
TOPICAL_OINTMENT | OPHTHALMIC | Status: DC | PRN
  Administered 2013-04-08: 1 via OPHTHALMIC

## 2013-04-08 MED ORDER — FLEET ENEMA 7-19 GM/118ML RE ENEM
1.0000 | ENEMA | Freq: Once | RECTAL | Status: DC | PRN
  Filled 2013-04-08: qty 1

## 2013-04-08 MED ORDER — ACETAMINOPHEN 325 MG PO TABS
650.0000 mg | ORAL_TABLET | ORAL | Status: DC | PRN
  Administered 2013-04-12 – 2013-04-14 (×7): 650 mg via ORAL
  Filled 2013-04-08 (×7): qty 2

## 2013-04-08 MED ORDER — BIOTENE DRY MOUTH MT LIQD
15.0000 mL | Freq: Four times a day (QID) | OROMUCOSAL | Status: DC
  Administered 2013-04-08 – 2013-04-14 (×17): 15 mL via OROMUCOSAL

## 2013-04-08 MED ORDER — FINASTERIDE 5 MG PO TABS
5.0000 mg | ORAL_TABLET | Freq: Every day | ORAL | Status: DC
  Filled 2013-04-08: qty 1

## 2013-04-08 MED ORDER — PROPOFOL 10 MG/ML IV EMUL
5.0000 ug/kg/min | INTRAVENOUS | Status: DC
  Administered 2013-04-08: 30 ug/kg/min via INTRAVENOUS
  Administered 2013-04-09: 25.723 ug/kg/min via INTRAVENOUS
  Filled 2013-04-08 (×2): qty 100

## 2013-04-08 MED ORDER — LACTATED RINGERS IV SOLN
INTRAVENOUS | Status: DC | PRN
  Administered 2013-04-08 (×3): via INTRAVENOUS

## 2013-04-08 MED ORDER — HYDROMORPHONE HCL PF 1 MG/ML IJ SOLN: 0.2500 mg | INTRAMUSCULAR | Status: DC | PRN

## 2013-04-08 MED ORDER — VITAMIN D3 25 MCG (1000 UNIT) PO TABS
1000.0000 [IU] | ORAL_TABLET | Freq: Every day | ORAL | Status: DC
  Administered 2013-04-09 – 2013-04-14 (×6): 1000 [IU] via ORAL
  Filled 2013-04-08 (×7): qty 1

## 2013-04-08 MED ORDER — THROMBIN 20000 UNITS EX SOLR
OROMUCOSAL | Status: DC | PRN
  Administered 2013-04-08 (×2): via TOPICAL

## 2013-04-08 MED ORDER — CEFAZOLIN SODIUM 1-5 GM-% IV SOLN
1.0000 g | Freq: Three times a day (TID) | INTRAVENOUS | Status: AC
  Administered 2013-04-08 – 2013-04-09 (×2): 1 g via INTRAVENOUS
  Filled 2013-04-08 (×2): qty 50

## 2013-04-08 MED ORDER — ALBUMIN HUMAN 5 % IV SOLN
INTRAVENOUS | Status: DC | PRN
  Administered 2013-04-08 (×2): via INTRAVENOUS

## 2013-04-08 MED ORDER — SODIUM CHLORIDE 0.9 % IV SOLN
INTRAVENOUS | Status: DC | PRN
  Administered 2013-04-08 (×2): via INTRAVENOUS

## 2013-04-08 MED ORDER — ALBUTEROL SULFATE HFA 108 (90 BASE) MCG/ACT IN AERS: 4.0000 | INHALATION_SPRAY | RESPIRATORY_TRACT | Status: DC | PRN

## 2013-04-08 MED ORDER — SODIUM CHLORIDE 0.9 % IJ SOLN
3.0000 mL | INTRAMUSCULAR | Status: DC | PRN
  Administered 2013-04-09: 3 mL via INTRAVENOUS

## 2013-04-08 MED ORDER — HYDROXYZINE HCL 50 MG PO TABS
50.0000 mg | ORAL_TABLET | Freq: Every evening | ORAL | Status: DC | PRN
  Filled 2013-04-08: qty 1

## 2013-04-08 MED ORDER — BUPIVACAINE HCL (PF) 0.5 % IJ SOLN
INTRAMUSCULAR | Status: AC
  Filled 2013-04-08: qty 30

## 2013-04-08 MED ORDER — THROMBIN 20000 UNITS EX SOLR
CUTANEOUS | Status: AC
  Filled 2013-04-08: qty 20000

## 2013-04-08 MED ORDER — SODIUM CHLORIDE 0.9 % IJ SOLN
3.0000 mL | Freq: Two times a day (BID) | INTRAMUSCULAR | Status: DC
  Administered 2013-04-08 – 2013-04-12 (×5): 3 mL via INTRAVENOUS

## 2013-04-08 MED ORDER — OXYCODONE HCL 5 MG PO TABS: 5.0000 mg | ORAL_TABLET | Freq: Once | ORAL | Status: DC | PRN

## 2013-04-08 MED ORDER — FENTANYL CITRATE 0.05 MG/ML IJ SOLN
25.0000 ug/h | INTRAMUSCULAR | Status: DC
  Administered 2013-04-08: 25 ug/h via INTRAVENOUS
  Filled 2013-04-08: qty 50

## 2013-04-08 MED ORDER — MIDAZOLAM HCL 2 MG/2ML IJ SOLN: 0.5000 mg | Freq: Once | INTRAMUSCULAR | Status: AC | PRN

## 2013-04-08 MED ORDER — PHENOL 1.4 % MT LIQD: 1.0000 | OROMUCOSAL | Status: DC | PRN

## 2013-04-08 MED ORDER — FENTANYL CITRATE 0.05 MG/ML IJ SOLN
INTRAMUSCULAR | Status: DC | PRN
  Administered 2013-04-08 (×2): 50 ug via INTRAVENOUS
  Administered 2013-04-08: 250 ug via INTRAVENOUS
  Administered 2013-04-08: 50 ug via INTRAVENOUS

## 2013-04-08 MED ORDER — LACTATED RINGERS IV SOLN
INTRAVENOUS | Status: DC | PRN
  Administered 2013-04-08 (×3): via INTRAVENOUS

## 2013-04-08 MED ORDER — PROMETHAZINE HCL 25 MG/ML IJ SOLN: 6.2500 mg | INTRAMUSCULAR | Status: DC | PRN

## 2013-04-08 MED ORDER — BUPIVACAINE-EPINEPHRINE 0.5% -1:200000 IJ SOLN
INTRAMUSCULAR | Status: DC | PRN
  Administered 2013-04-08: 29 mL

## 2013-04-08 MED ORDER — POLYETHYLENE GLYCOL 3350 17 G PO PACK
17.0000 g | PACK | Freq: Every day | ORAL | Status: DC | PRN
  Filled 2013-04-08: qty 1

## 2013-04-08 MED ORDER — BISACODYL 10 MG RE SUPP: 10.0000 mg | Freq: Every day | RECTAL | Status: DC | PRN

## 2013-04-08 MED ORDER — ALUM & MAG HYDROXIDE-SIMETH 200-200-20 MG/5ML PO SUSP: 30.0000 mL | Freq: Four times a day (QID) | ORAL | Status: DC | PRN

## 2013-04-08 MED ORDER — SIMVASTATIN 10 MG PO TABS
10.0000 mg | ORAL_TABLET | Freq: Every day | ORAL | Status: DC
  Filled 2013-04-08: qty 1

## 2013-04-08 MED ORDER — PHENYLEPHRINE HCL 10 MG/ML IJ SOLN
10.0000 mg | INTRAVENOUS | Status: DC | PRN
  Administered 2013-04-08: 16:00:00 via INTRAVENOUS
  Administered 2013-04-08: 20 ug/min via INTRAVENOUS

## 2013-04-08 MED ORDER — PANTOPRAZOLE SODIUM 40 MG IV SOLR
40.0000 mg | Freq: Every day | INTRAVENOUS | Status: DC
  Administered 2013-04-08: 40 mg via INTRAVENOUS
  Filled 2013-04-08 (×2): qty 40

## 2013-04-08 MED ORDER — ONDANSETRON HCL 4 MG/2ML IJ SOLN
INTRAMUSCULAR | Status: DC | PRN
  Administered 2013-04-08: 4 mg via INTRAVENOUS

## 2013-04-08 MED ORDER — CALCIUM CARBONATE-VITAMIN D 500-200 MG-UNIT PO TABS
1.0000 | ORAL_TABLET | Freq: Two times a day (BID) | ORAL | Status: DC
  Administered 2013-04-10 – 2013-04-14 (×9): 1 via ORAL
  Filled 2013-04-08 (×13): qty 1

## 2013-04-08 MED ORDER — LIDOCAINE HCL (CARDIAC) 20 MG/ML IV SOLN
INTRAVENOUS | Status: DC | PRN
  Administered 2013-04-08: 20 mg via INTRAVENOUS

## 2013-04-08 MED ORDER — PANTOPRAZOLE SODIUM 40 MG PO TBEC: 80.0000 mg | DELAYED_RELEASE_TABLET | Freq: Every day | ORAL | Status: DC

## 2013-04-08 MED ORDER — PROPOFOL INFUSION 10 MG/ML OPTIME
INTRAVENOUS | Status: DC | PRN
  Administered 2013-04-08: 25 ug/kg/min via INTRAVENOUS

## 2013-04-08 MED ORDER — CHLORHEXIDINE GLUCONATE 4 % EX LIQD
60.0000 mL | Freq: Once | CUTANEOUS | Status: DC
Start: 2013-04-08 — End: 2013-04-08

## 2013-04-08 MED ORDER — HYDROMORPHONE HCL PF 1 MG/ML IJ SOLN: 0.5000 mg | INTRAMUSCULAR | Status: DC | PRN

## 2013-04-08 MED ORDER — CEFAZOLIN SODIUM 1-5 GM-% IV SOLN
1.0000 g | Freq: Once | INTRAVENOUS | Status: AC
  Administered 2013-04-08: 1 g via INTRAVENOUS

## 2013-04-08 MED ORDER — PROPOFOL 10 MG/ML IV BOLUS
INTRAVENOUS | Status: DC | PRN
  Administered 2013-04-08: 100 mg via INTRAVENOUS

## 2013-04-08 MED ORDER — FENTANYL CITRATE 0.05 MG/ML IJ SOLN: 25.0000 ug | INTRAMUSCULAR | Status: DC | PRN

## 2013-04-08 MED ORDER — ACETAMINOPHEN 10 MG/ML IV SOLN
1000.0000 mg | Freq: Once | INTRAVENOUS | Status: AC
  Administered 2013-04-08: 1000 mg via INTRAVENOUS
  Filled 2013-04-08: qty 100

## 2013-04-08 MED ORDER — CHLORHEXIDINE GLUCONATE 0.12 % MT SOLN
15.0000 mL | Freq: Two times a day (BID) | OROMUCOSAL | Status: DC
  Administered 2013-04-08 – 2013-04-14 (×11): 15 mL via OROMUCOSAL
  Filled 2013-04-08 (×14): qty 15

## 2013-04-08 MED ORDER — DEXTROSE-NACL 5-0.9 % IV SOLN
INTRAVENOUS | Status: DC
  Administered 2013-04-08: 50 mL/h via INTRAVENOUS

## 2013-04-08 MED ORDER — MIDAZOLAM HCL 5 MG/5ML IJ SOLN
INTRAMUSCULAR | Status: DC | PRN
  Administered 2013-04-08: 1.5 mg via INTRAVENOUS

## 2013-04-08 MED ORDER — DOCUSATE SODIUM 100 MG PO CAPS
100.0000 mg | ORAL_CAPSULE | Freq: Two times a day (BID) | ORAL | Status: DC
  Administered 2013-04-09 – 2013-04-14 (×10): 100 mg via ORAL
  Filled 2013-04-08 (×12): qty 1

## 2013-04-08 MED ORDER — MENTHOL 3 MG MT LOZG
1.0000 | LOZENGE | OROMUCOSAL | Status: DC | PRN
  Filled 2013-04-08: qty 9

## 2013-04-08 MED ORDER — SODIUM CHLORIDE 0.9 % IV SOLN: 250.0000 mL | INTRAVENOUS | Status: DC

## 2013-04-08 MED ORDER — TAMSULOSIN HCL 0.4 MG PO CAPS: 0.4000 mg | ORAL_CAPSULE | Freq: Every day | ORAL | Status: DC

## 2013-04-08 MED ORDER — OXYCODONE-ACETAMINOPHEN 5-325 MG PO TABS: 1.0000 | ORAL_TABLET | ORAL | Status: DC | PRN

## 2013-04-08 MED ORDER — PROPOFOL 10 MG/ML IV EMUL: 5.0000 ug/kg/min | INTRAVENOUS | Status: DC

## 2013-04-08 MED ORDER — BUPIVACAINE-EPINEPHRINE (PF) 0.5% -1:200000 IJ SOLN
INTRAMUSCULAR | Status: AC
  Filled 2013-04-08: qty 10

## 2013-04-08 MED ORDER — DEXAMETHASONE SODIUM PHOSPHATE 4 MG/ML IJ SOLN
INTRAMUSCULAR | Status: DC | PRN
  Administered 2013-04-08: 4 mg via INTRAVENOUS

## 2013-04-08 MED ORDER — METHOCARBAMOL 500 MG PO TABS
500.0000 mg | ORAL_TABLET | Freq: Four times a day (QID) | ORAL | Status: DC | PRN
  Administered 2013-04-12 – 2013-04-14 (×7): 500 mg via ORAL
  Filled 2013-04-08 (×8): qty 1

## 2013-04-08 MED ORDER — FENTANYL BOLUS VIA INFUSION
25.0000 ug | Freq: Four times a day (QID) | INTRAVENOUS | Status: DC | PRN
  Administered 2013-04-09: 25 ug via INTRAVENOUS
  Filled 2013-04-08: qty 100

## 2013-04-08 MED ORDER — FENTANYL CITRATE 0.05 MG/ML IJ SOLN
INTRAMUSCULAR | Status: AC
  Administered 2013-04-08: 25 ug via INTRAVENOUS
  Filled 2013-04-08: qty 2

## 2013-04-08 MED ORDER — BUPROPION HCL ER (SR) 150 MG PO TB12
150.0000 mg | ORAL_TABLET | Freq: Two times a day (BID) | ORAL | Status: DC
  Filled 2013-04-08: qty 1

## 2013-04-08 MED ORDER — MEPERIDINE HCL 25 MG/ML IJ SOLN: 6.2500 mg | INTRAMUSCULAR | Status: DC | PRN

## 2013-04-08 MED ORDER — ACETAMINOPHEN 650 MG RE SUPP: 650.0000 mg | RECTAL | Status: DC | PRN

## 2013-04-08 MED ORDER — HYDROCODONE-ACETAMINOPHEN 5-325 MG PO TABS: 1.0000 | ORAL_TABLET | ORAL | Status: DC | PRN

## 2013-04-08 MED ORDER — EPHEDRINE SULFATE 50 MG/ML IJ SOLN
INTRAMUSCULAR | Status: DC | PRN
  Administered 2013-04-08 (×7): 5 mg via INTRAVENOUS

## 2013-04-08 MED ORDER — KCL IN DEXTROSE-NACL 20-5-0.9 MEQ/L-%-% IV SOLN
INTRAVENOUS | Status: DC
  Administered 2013-04-08: 100 mL/h via INTRAVENOUS
  Filled 2013-04-08 (×2): qty 1000

## 2013-04-08 MED ORDER — ONDANSETRON HCL 4 MG/2ML IJ SOLN: 4.0000 mg | INTRAMUSCULAR | Status: DC | PRN

## 2013-04-08 MED ORDER — OXYCODONE HCL 5 MG/5ML PO SOLN
5.0000 mg | Freq: Once | ORAL | Status: DC | PRN
Start: 2013-04-08 — End: 2013-04-08

## 2013-04-08 SURGICAL SUPPLY — 73 items
ADH SKN CLS APL DERMABOND .7 (GAUZE/BANDAGES/DRESSINGS) ×1
ADH SKN CLS LQ APL DERMABOND (GAUZE/BANDAGES/DRESSINGS) ×1
BLADE SURG ROTATE 9660 (MISCELLANEOUS) IMPLANT
BUR ROUND FLUTED 4 SOFT TCH (BURR) ×2 IMPLANT
CAGE CONCORDE BULLET 9X8X27 (Cage) ×4 IMPLANT
CAGE CONCORDE BULLET 9X9X27 (Cage) ×1 IMPLANT
CAGE SPNL 5D BLT NOSE 27X9X8X (Cage) IMPLANT
CAGE SPNL PRLL BLT NOSE 27X9X8 (Cage) IMPLANT
CLOTH BEACON ORANGE TIMEOUT ST (SAFETY) ×2 IMPLANT
CONNECTOR SFX SIZE A6 (Orthopedic Implant) ×1 IMPLANT
CORDS BIPOLAR (ELECTRODE) ×2 IMPLANT
COVER MAYO STAND STRL (DRAPES) ×4 IMPLANT
COVER SURGICAL LIGHT HANDLE (MISCELLANEOUS) ×2 IMPLANT
DERMABOND ADHESIVE PROPEN (GAUZE/BANDAGES/DRESSINGS) ×1
DERMABOND ADVANCED (GAUZE/BANDAGES/DRESSINGS) ×1
DERMABOND ADVANCED .7 DNX12 (GAUZE/BANDAGES/DRESSINGS) ×1 IMPLANT
DERMABOND ADVANCED .7 DNX6 (GAUZE/BANDAGES/DRESSINGS) IMPLANT
DRAPE C-ARM 42X72 X-RAY (DRAPES) ×4 IMPLANT
DRAPE SURG 17X23 STRL (DRAPES) ×6 IMPLANT
DRAPE TABLE COVER HEAVY DUTY (DRAPES) ×2 IMPLANT
DRSG MEPILEX BORDER 4X12 (GAUZE/BANDAGES/DRESSINGS) ×1 IMPLANT
DRSG MEPILEX BORDER 4X4 (GAUZE/BANDAGES/DRESSINGS) IMPLANT
DRSG MEPILEX BORDER 4X8 (GAUZE/BANDAGES/DRESSINGS) IMPLANT
DURAPREP 26ML APPLICATOR (WOUND CARE) ×2 IMPLANT
DURASEAL SPINE SEALANT 3ML (MISCELLANEOUS) ×1 IMPLANT
ELECT BLADE 6.5 EXT (BLADE) IMPLANT
ELECT CAUTERY BLADE 6.4 (BLADE) ×2 IMPLANT
ELECT REM PT RETURN 9FT ADLT (ELECTROSURGICAL) ×2
ELECTRODE REM PT RTRN 9FT ADLT (ELECTROSURGICAL) ×1 IMPLANT
EVACUATOR 1/8 PVC DRAIN (DRAIN) IMPLANT
GLOVE BIOGEL PI IND STRL 7.5 (GLOVE) ×1 IMPLANT
GLOVE BIOGEL PI INDICATOR 7.5 (GLOVE) ×2
GLOVE ECLIPSE 7.0 STRL STRAW (GLOVE) ×2 IMPLANT
GLOVE ECLIPSE 8.5 STRL (GLOVE) ×2 IMPLANT
GLOVE SURG 8.5 LATEX PF (GLOVE) ×2 IMPLANT
GOWN PREVENTION PLUS LG XLONG (DISPOSABLE) ×1 IMPLANT
GOWN PREVENTION PLUS XXLARGE (GOWN DISPOSABLE) ×2 IMPLANT
GOWN STRL NON-REIN LRG LVL3 (GOWN DISPOSABLE) ×4 IMPLANT
HEMOSTAT SURGICEL 2X14 (HEMOSTASIS) IMPLANT
KIT BASIN OR (CUSTOM PROCEDURE TRAY) ×2 IMPLANT
KIT ROOM TURNOVER OR (KITS) ×2 IMPLANT
NDL ASP BONE MRW 11GX15 (NEEDLE) IMPLANT
NEEDLE 22X1 1/2 (OR ONLY) (NEEDLE) ×2 IMPLANT
NEEDLE ASP BONE MRW 11GX15 (NEEDLE) IMPLANT
NEEDLE BONE MARROW 8GAX6 (NEEDLE) IMPLANT
NS IRRIG 1000ML POUR BTL (IV SOLUTION) ×2 IMPLANT
PACK LAMINECTOMY ORTHO (CUSTOM PROCEDURE TRAY) ×2 IMPLANT
PAD ARMBOARD 7.5X6 YLW CONV (MISCELLANEOUS) ×4 IMPLANT
PATTIES SURGICAL .75X.75 (GAUZE/BANDAGES/DRESSINGS) ×4 IMPLANT
ROD STRAIGHT EXPED 6.35X120 (Rod) ×2 IMPLANT
SCREW EXPEDIUM POLY 6.35 6X45 (Screw) ×3 IMPLANT
SCREW POLY EXP 6.35 7X40MM (Screw) ×1 IMPLANT
SCREW POLY EXP 6.35 7X45MM (Screw) ×1 IMPLANT
SCREW POLY EXPEDIUM 6.35 5X40 (Screw) ×3 IMPLANT
SCREW POLY EXPEDIUM 6.35 8X40 (Screw) ×1 IMPLANT
SCREW POLY EXPEDIUM 6.35 8X45 (Screw) ×1 IMPLANT
SCREW SET EXPEDIUM 6.35 (Screw) ×9 IMPLANT
SPONGE LAP 4X18 X RAY DECT (DISPOSABLE) ×2 IMPLANT
SUT NURALON 4 0 TR CR/8 (SUTURE) ×1 IMPLANT
SUT VIC AB 0 CT1 27 (SUTURE) ×2
SUT VIC AB 0 CT1 27XBRD ANBCTR (SUTURE) ×1 IMPLANT
SUT VIC AB 1 CTX 36 (SUTURE) ×4
SUT VIC AB 1 CTX36XBRD ANBCTR (SUTURE) ×2 IMPLANT
SUT VIC AB 2-0 CT1 27 (SUTURE) ×2
SUT VIC AB 2-0 CT1 TAPERPNT 27 (SUTURE) ×1 IMPLANT
SUT VICRYL 0 CT 1 36IN (SUTURE) ×4 IMPLANT
SUT VICRYL 4-0 PS2 18IN ABS (SUTURE) ×4 IMPLANT
SYR CONTROL 10ML LL (SYRINGE) ×4 IMPLANT
TOWEL OR 17X24 6PK STRL BLUE (TOWEL DISPOSABLE) ×2 IMPLANT
TOWEL OR 17X26 10 PK STRL BLUE (TOWEL DISPOSABLE) ×2 IMPLANT
TRAY FOLEY CATH 14FR (SET/KITS/TRAYS/PACK) ×2 IMPLANT
WATER STERILE IRR 1000ML POUR (IV SOLUTION) ×2 IMPLANT
YANKAUER SUCT BULB TIP NO VENT (SUCTIONS) ×2 IMPLANT

## 2013-04-08 NOTE — Anesthesia Preprocedure Evaluation (Addendum)
Anesthesia Evaluation  Patient identified by MRN, date of birth, ID band Patient awake    Reviewed: Allergy & Precautions, H&P , NPO status , Patient's Chart, lab work & pertinent test results  History of Anesthesia Complications Negative for: history of anesthetic complications  Airway Mallampati: III TM Distance: >3 FB Neck ROM: Full    Dental  (+) Dental Advisory Given and Teeth Intact   Pulmonary sleep apnea and Continuous Positive Airway Pressure Ventilation ,  breath sounds clear to auscultation  Pulmonary exam normal       Cardiovascular + CAD (? stress test '08 normal LVF) + Valvular Problems/Murmurs (remote h/o murmur) Rhythm:Regular Rate:Normal  Heart murmur, pt reports heart function normal per cardiologist   Neuro/Psych PSYCHIATRIC DISORDERS Depression Chronic back pain: tramadol    GI/Hepatic Neg liver ROS, GERD-  Medicated and Controlled,  Endo/Other  negative endocrine ROS  Renal/GU negative Renal ROS     Musculoskeletal  (+) Arthritis -,   Abdominal   Peds  Hematology negative hematology ROS (+)   Anesthesia Other Findings   Reproductive/Obstetrics                      Anesthesia Physical Anesthesia Plan  ASA: III  Anesthesia Plan: General   Post-op Pain Management:    Induction: Intravenous  Airway Management Planned: Oral ETT  Additional Equipment: Arterial line  Intra-op Plan:   Post-operative Plan: Extubation in OR  Informed Consent: I have reviewed the patients History and Physical, chart, labs and discussed the procedure including the risks, benefits and alternatives for the proposed anesthesia with the patient or authorized representative who has indicated his/her understanding and acceptance.   Dental advisory given  Plan Discussed with: Anesthesiologist, Surgeon and CRNA  Anesthesia Plan Comments: (Plan routine monitors, A line, GETA)       Anesthesia  Quick Evaluation

## 2013-04-08 NOTE — Brief Op Note (Addendum)
04/08/2013  4:12 PM  PATIENT:  Charles [name]  79 y.o. male  PRE-OPERATIVE DIAGNOSIS:  collapsing degenerative lumbar scoliosis, right foraminal stenosis L3-4, L4-5  POST-OPERATIVE DIAGNOSIS:  collapsing degenerative lumbar scoliosis, right foraminal stenosis  PROCEDURE:  Procedure(s): Right TLIF L2-3, L3-4 and L4-5, Removal of hardware L5-S1, Segmental fixation L2 to S1, Interbody cages x3, local bone graft (N/A)  SURGEON:  Surgeon(s) and Role: Kerrin Champagne, MD - Primary  PHYSICIAN ASSISTANT: Maud Deed, PA-C   ANESTHESIA:   general  EBL:  Total I/O In: 8110 [I.V.:5000; Blood:2610; IV Piggyback:500] Out: 2400 [Urine:600; Blood:1800]  BLOOD ADMINISTERED:1000 (2Units autogenous and 2U bank) CC PRBC, 650 CC CELLSAVER and 1U FFP  DRAINS: Urinary Catheter (Foley)   COMPLICATIONS: Small dural tear 3 mm right L4 posterior dura, non neural element. Repaired suture and Tuseal.  LOCAL MEDICATIONS USED:  MARCAINE 1/2% with 1/200,000 epi Amount: 30 ml  SPECIMEN:  No Specimen  DISPOSITION OF SPECIMEN:  N/A  COUNTS:  YES  TOURNIQUET:  * No tourniquets in log *  DICTATION: .Dragon Dictation  PLAN OF CARE: Admit to inpatient   PATIENT DISPOSITION:  ICU - intubated and hemodynamically stable.   Delay start of Pharmacological VTE agent (>24hrs) due to surgical blood loss or risk of bleeding: yes

## 2013-04-08 NOTE — Transfer of Care (Signed)
Immediate Anesthesia Transfer of Care Note  Patient: Charles [name]  Procedure(s) Performed: Procedure(s): Right TLIF L2-3, L3-4 and L4-5, Removal of hardware L5-S1, Segmental fixation L2 to S1, Interbody cages x3, local bone graft (N/A)  Patient Location: SICU  Anesthesia Type:General  Level of Consciousness: Patient remains intubated per anesthesia plan  Airway & Oxygen Therapy: Patient remains intubated per anesthesia plan  Post-op Assessment: Post -op Vital signs reviewed and stable  Post vital signs: stable  Complications: No apparent anesthesia complications

## 2013-04-08 NOTE — Anesthesia Postprocedure Evaluation (Signed)
  Anesthesia Post-op Note  Patient: Charles [name]  Procedure(s) Performed: Procedure(s): Right TLIF L2-3, L3-4 and L4-5, Removal of hardware L5-S1, Segmental fixation L2 to S1, Interbody cages x3, local bone graft (N/A)  Patient Location: SICU  Anesthesia Type:General  Level of Consciousness: Patient remains intubated per anesthesia plan  Airway and Oxygen Therapy: Patient remains intubated per anesthesia plan and Patient placed on Ventilator (see vital sign flow sheet for setting)  Post-op Pain: none  Post-op Assessment: Post-op Vital signs reviewed, Patient's Cardiovascular Status Stable, Respiratory Function Stable, Patent Airway, No signs of Nausea or vomiting and Pain level controlled  Post-op Vital Signs: stable  Complications: No apparent anesthesia complications

## 2013-04-08 NOTE — Anesthesia Procedure Notes (Signed)
Procedure Name: Intubation Date/Time: 04/08/2013 7:55 AM Performed by: Elon Alas Pre-anesthesia Checklist: Patient identified, Timeout performed, Emergency Drugs available, Suction available and Patient being monitored Patient Re-evaluated:Patient Re-evaluated prior to inductionOxygen Delivery Method: Circle system utilized Preoxygenation: Pre-oxygenation with 100% oxygen Intubation Type: IV induction Ventilation: Mask ventilation without difficulty and Oral airway inserted - appropriate to patient size Laryngoscope Size: Mac and 4 Grade View: Grade II Tube type: Oral Tube size: 8.0 mm Number of attempts: 2 Airway Equipment and Method: Stylet and Video-laryngoscopy Placement Confirmation: positive ETCO2,  ETT inserted through vocal cords under direct vision and breath sounds checked- equal and bilateral Secured at: 24 cm Tube secured with: Tape Dental Injury: Teeth and Oropharynx as per pre-operative assessment  Comments: DLx1 with MAC 4 - unable to visualize due to limited mouth opening and stiff neck.  DLx1 with glidescope - grade 2 view.  Easy mask ventilation with oral airway.

## 2013-04-08 NOTE — Preoperative (Signed)
Beta Blockers   Reason not to administer Beta Blockers:Not Applicable 

## 2013-04-08 NOTE — Consult Note (Signed)
PULMONARY  / CRITICAL CARE MEDICINE  Name: Charles [name] MRN: [id number] DOB: Mar 29, 1931    ADMISSION DATE:  04/08/2013 CONSULTATION DATE:  04/08/13  REFERRING MD :  Gaylord Shih PRIMARY SERVICE: Ortho  CHIEF COMPLAINT:  Post-op respiratory failure.  BRIEF PATIENT DESCRIPTION: 79 year old male with post-op respiratory failure after lumbar spine repair per ortho.  9 hours procedure for fusion.  PCCM consulted for post-op respiratory failure.  SIGNIFICANT EVENTS / STUDIES:  5/6 Respiratory failure post-op.  LINES / TUBES: PIV  ET 5/6>>>  CULTURES: None  ANTIBIOTICS: None  PAST MEDICAL HISTORY :  Past Medical History  Diagnosis Date  . GERD (gastroesophageal reflux disease)   . Cataract   . Depression   . Hyperlipidemia   . Osteoporosis   . Barrett's esophagus   . Lymphocytosis 06/2012  . Diverticulosis   . Vitamin D deficiency   . Diverticulosis   . BPH (benign prostatic hyperplasia)   . Murmur   . Constipation   . Arthritis   . Urinary retention with incomplete bladder emptying     pt uses bathroom and within 30 mins needs to go again  . Sleep apnea     wears CPAP  . CAD (coronary artery disease)     only on stress test; never had MI, PCI or CABG.  In 2008, LVEF was reportedly normal.    Past Surgical History  Procedure Laterality Date  . Upper gastrointestinal endoscopy    . Repair spigelian hernia  2011  . Inguinal hernia repair      x4  . Back surgury      f  . Spinal fusion  1996  . Finger surgery Right     right thumb  . Colonoscopy    . Eye surgery Bilateral    Prior to Admission medications   Medication Sig Start Date End Date Taking? Authorizing Provider  aspirin EC 81 MG tablet Take 81 mg by mouth daily.   Yes Historical Provider, MD  buPROPion (WELLBUTRIN SR) 150 MG 12 hr tablet Take 150 mg by mouth 2 (two) times daily.  06/28/11  Yes Historical Provider, MD  calcium-vitamin D (OSCAL WITH D) 500-200 MG-UNIT per tablet Take 1 tablet by mouth 2  (two) times daily.   Yes Historical Provider, MD  cholecalciferol (VITAMIN D) 1000 UNITS tablet Take 1,000 Units by mouth daily.   Yes Historical Provider, MD  finasteride (PROSCAR) 5 MG tablet Take 5 mg by mouth daily.  08/16/11  Yes Historical Provider, MD  fish oil-omega-3 fatty acids 1000 MG capsule Take 1 g by mouth daily.    Yes Historical Provider, MD  HYDROcodone-acetaminophen (NORCO/VICODIN) 5-325 MG per tablet Take 0.5-1 tablets by mouth every 6 (six) hours as needed for pain.   Yes Historical Provider, MD  hydrOXYzine (ATARAX/VISTARIL) 50 MG tablet Take 50 mg by mouth at bedtime as needed (sleep).  08/18/11  Yes Historical Provider, MD  lovastatin (MEVACOR) 20 MG tablet Take 1 tablet by mouth at bedtime. 07/22/12  Yes Historical Provider, MD  Multiple Vitamins-Minerals (MULTIVITAMIN WITH MINERALS) tablet Take 1 tablet by mouth daily.     Yes Historical Provider, MD  nabumetone (RELAFEN) 500 MG tablet Take 500-1,000 mg by mouth 2 (two) times daily as needed for pain. 2 tabs in am and 1 tab in pm 05/27/12  Yes Historical Provider, MD  omeprazole (PRILOSEC) 40 MG capsule Take 1 capsule (40 mg total) by mouth 2 (two) times daily. 12/13/12  Yes Meryl Dare, MD  ranitidine (ZANTAC) 300 MG tablet Take 1 tablet (300 mg total) by mouth at bedtime. 12/13/12  Yes Meryl Dare, MD  Tamsulosin HCl (FLOMAX) 0.4 MG CAPS Take 0.4 mg by mouth daily.  08/30/11  Yes Historical Provider, MD  traMADol (ULTRAM) 50 MG tablet Take 100 mg by mouth 2 (two) times daily as needed for pain.  08/23/11  Yes Historical Provider, MD  cyclobenzaprine (FLEXERIL) 5 MG tablet Take 5 mg by mouth 3 (three) times daily as needed.    Historical Provider, MD   Allergies  Allergen Reactions  . Celebrex (Celecoxib) Other (See Comments)    Created stomach ulcers.    FAMILY HISTORY:  Family History  Problem Relation Age of Onset  . Prostate cancer Father   . CAD Mother    SOCIAL HISTORY:  reports that he has never smoked.  He has never used smokeless tobacco. He reports that he does not drink alcohol or use illicit drugs.  REVIEW OF SYSTEMS:  Unattainable, patient is sedated and intubate.  SUBJECTIVE: Sedated and intubated.  No history.  VITAL SIGNS: Temp:  [97.9 F (36.6 C)] 97.9 F (36.6 C) (05/06 0615) Pulse Rate:  [52] 52 (05/06 0615) Resp:  [20] 20 (05/06 0615) BP: (109)/(47) 109/47 mmHg (05/06 0615) SpO2:  [96 %] 96 % (05/06 0615) HEMODYNAMICS:   VENTILATOR SETTINGS: PRVC 20, Tv 550, PEEP 5 and FiO2 of 100%.   INTAKE / OUTPUT: Intake/Output     05/05 0701 - 05/06 0700 05/06 0701 - 05/07 0700   I.V.  5000   Blood  2610   IV Piggyback  500   Total Intake   8110   Urine  600   Blood  1800   Total Output   2400   Net   +5710         PHYSICAL EXAMINATION: General: morbidly obese on vent, NAD Neuro:  Sedate / paralyzed post op, pupils =  HEENT:  OETT, facial swelling Cardiovascular:  s1s2 rrr, no m/r/g Lungs:  diffuse rales, even/non-labored on vent Abdomen: abd nml Musculoskeletal:  No acute deformities Skin:  erythematous band across abd (prone in OR), otherwise wnl  LABS:  Recent Labs Lab 04/08/13 1129 04/08/13 1304 04/08/13 1437  HGB 7.1* 9.5* 11.9*  NA 140 140 139  K 4.0 4.4 4.8  GLUCOSE 93 142* 141*   No results found for this basename: GLUCAP,  in the last 168 hours  CXR: 5/6 - pending  ASSESSMENT / PLAN:  PULMONARY A: Acute Respiratory failure - post-op spinal surgery, prolonged OR time OSA - on CPAP at home  P:   -full vent support overnight -now abg and in AM -now pcxr to assess ETT placement -will need CPAP post extubation QHS  CARDIOVASCULAR A:  CAD - noted on previous stress test HLD  P:  -assess troponin x1 with prolonged OR time -assess EKG -hold zocor   RENAL A:   No acute issues  P:   -assess BMP now -follow lytes, replace PRN  GASTROINTESTINAL / GU A:   GERD SUP BPH  P:   -protonix for SUP -NPO -hold proscar /  flomax -OGT -consider feeding in am 5/7  HEMATOLOGIC A:   Anemia - s/p transfusion in OR.  2 units PRBC's, 2 units cell saver, 5L NS  P:  -follow cbc   INFECTIOUS A:   Post-op Spinal Surgery  P:   -monitor fever curve / leukocytosis -abx per primary   ENDOCRINE A:   Hyperglycemia -  no hx of DM, post-op hyperglycemia  P:   -SSI   NEUROLOGIC A:   S/P Prolonged Spinal Surgery - TLIF L2-3, L3-4 and L4-5, Removal of hardware L5-S1, Segmental fixation L2 to S1, Interbody cages x3, local bone graft (N/A) Dural Puncture  Pain  Depression   P:   -post op care per Dr. Otelia Sergeant -hold PCA, etc until extubated -hold home anti-depressants    Canary Brim, NP-C [facility] Pgr: [phone] or [phone]  I have personally obtained a history, examined the patient, evaluated laboratory and imaging results, formulated the assessment and plan and placed orders.  CRITICAL CARE: The patient is critically ill with multiple organ systems failure and requires high complexity decision making for assessment and support, frequent evaluation and titration of therapies, application of advanced monitoring technologies and extensive interpretation of multiple databases. Critical Care Time devoted to patient care services described in this note is 45 minutes.   Alyson Reedy, M.D. Pulmonary and Critical Care Medicine [facility] Pager: [phone]  04/08/2013, 4:49 PM

## 2013-04-08 NOTE — Op Note (Signed)
04/08/2013  6:17 PM  PATIENT:  Charles [name]  79 y.o. male  MRN: [id number]  OPERATIVE REPORT  PRE-OPERATIVE DIAGNOSIS:  collapsing degenerative lumbar scoliosis, right foraminal stenosis L3-4, L4-5  POST-OPERATIVE DIAGNOSIS:  collapsing degenerative lumbar scoliosis, right foraminal stenosis L3-4 and L4-5, dural Tear right L4-5  PROCEDURE:  Procedure(s): Right TLIF L2-3, L3-4 and L4-5, Removal of hardware L5-S1, Segmental fixation L2 to S1, Interbody cages x3, local bone graft Repair of dural tear right L4-5 with 4-0 neurolon and Tuseal.    SURGEON:  Kerrin Champagne, MD     ASSISTANT:  Maud Deed, PA-C  (Present throughout the entire procedure and necessary for completion of procedure in a timely manner)     ANESTHESIA:  General,    COMPLICATIONS:  Dural Tear right L4-5, 3mm, neural elements intact.     COMPONENTS:Depuy Expedium Pedicle screws and rods L2-S1, 5 pedicle screws left side and 4 pedicle screw right side, Depuy cages, L4-5: 8mm x 27mm lordotic Concorde cage,  L3-4: 9mm x 27mm lordotic Concord cage, and L2-3: 8mm x 27mm parallel Concord cage.  PROCEDURE: The patient was met in the holding area, and the appropriate lumbar levels identified and marked with an "X" and my initials. I had discussion with the patient in the preop holding area regarding a change of consent form.The fusion levels are reidentified as L2-3, L3-4 and L4-5 with removal of hardware at the previous fusion site L5-S1. This means extension of the fusion upwards to the L2-3 (Stable vertebrae level)Patient understands the rationale to perform TLIFs at three levels to decompress the right L3-4 and L4-5 lateral recess and foramenal stenosis and to allow for some improved correction of his overall kyphoscoliosis curves. The L1-2 disc is normal on MRI and correction of the deformity from L2-S1 should  Eliminate the need to fuse to the L1 level.  The patient was then transported to OR and was placed under  general anestheticwithout difficulty. The patient received appropriate preoperative antibiotic prophylaxis Ancef.  Nursing staff inserted a Foley catheter under sterile conditions. The patient was then turned to a prone position using the [place] spine frame. PAS. all pressure points well padded the arms at the side to 90 90. Standard prep with DuraPrep solution draped in the usual manner from the lower dorsal spine the mid sacral segment. Iodine Vi-Drape was used and the old incision scar was marked. Time-out procedure was called and correct. Skin in the midline between L1 and S2 was then infiltrated with Marcaine half percent with 1-200,000 epinephrine total of 10 cc used. Incision was then made ellipsing the old incision scar extending from L1-S2 through the skin and subcutaneous layers down to the patient's lumbodorsal fascia and spinous processes. The incision then carried sharply excising the supraspinous ligament and then continuing the lateral aspect of the spinous processes of L1 L2-L3 L4-L5. Cobb elevator used to carefully elevate the paralumbar muscles off of the posterior elements using electrocautery carefully drilled bleeding and perform dissection of the muscle tissues of the preserving the facet at the L1-2 level and L2-3. Continuing the exposure out laterally to expose the transverse process of L2, L3, L4, L5 and the sacrum in the midline and out to the sacra ala bilaterally exposing the Posterolateral fusion masses and pedicle screws at the L5-S1 level. incision was carried in the midline down to the S2-S3 level area bleeders controlled using electrocautery monopolar electrocautery. The hardware at the L5-S1 level, pedicle screws and rods identified as [employer] hardware from  1996. These were able to be Removed by removing the individual screw cap locking screws with a small hex screw driver Then removing any soft tissue about the rods and sliding the rods out caudally. The pedicle Screws were  the easily removed with a rachet socket applied to the screw head.  Clamps were then placed at the L2-3 level and also at the L4spinous process level observed on lateral radiograph the upper clamp was beneath the L2 spinous process. Spinous processes of L2, L3 and L4 marked with  OR marking pen sterilely.Patient has significant  scoliosis deformity with prominence of the posterior aspect of the L2 and L3 joints bilaterally and rotation of the spine to the left side so that the spinous processes was larger to than the left side. Viper retractor was used for the upper part of the incision and the cerebellar retractor distally. Spinous processes of  L3 and L4, were then resected down to the base the lamina at each segment the lower 50% of the spinous process of L2 was resected and Leksell rongeur used to resect inferior aspect of the lamina on the left side at the L2 level and partially on the right side at L2. The left medial 40% of the facet of L2-3,  L3-4 and L4-5 were resected in order to decompress the left side of the lumbar thecal sac at L2.3, L:3-4 and L4-5 and decompress the left L3,L4 and L5 neuroforamen. Osteotomes and 2mm and 3mm kerrisons were used for this portion of the decompression. Similarly the right side decompression was carried out but complete facetectomies were perform on the right at L2-3 , L3-4 and L4-5 to provide for exposure of the right side L2-3 and L3-4 and L4-5 neuroforamen for ease of placement of TLIFs (transforaminal lumbar interbody fusion) at each level inferior portions of the lamina and pars were also resected first beginning with the Leksell rongeur and osteotomes and then resecting using 2 and 3 mm Kerrison. Then performing a resection Of the right L4 lamina and medial L4-5 facet a small dural tear occurred over the right posterior aspect of the L4-5 medial hypertrophic facet,neural elements were intact the dural tear at about 2-3 mm. Cottonoids were placed over the dural tear  and continued laminectomy was carried out resecting the central portions of the lamina of L4 and L5 performing foraminotomies on the right side at the L3, L4 and L5 levels. The inferior articular process L2, L3 and L4 were resected on the right side. The L5 nerve root identified bilaterally and the medial aspect of the L5 pedicle. Superior articular process of L5 was then resected from the right side further decompressing the right L5 nerve and providing for exposure of the area just superior to the L5 pedicle for a placement of cage.A large amount of hypertrophic ligmentum flavum was found impressing on the right lateral recesses at L3-4 and L4-5 and narrowing the respective L3 and L4 neuroforamen. Loupe magnification and headlight were used during this portion procedure.Attention then turned to repair of the dural tear of the right side at L4-5level carefully neural elements were evaluated and found to be normal within the thecal sac and intact and three 4-0 Neurolon sutures used to repair the dura in simple fashion. Plenty of soft tissue overlying the dura was present which allowed for approximation of the relatively less difficult. Valsalva was performed  To 30 mm Hg and demonstrated no leak from area of the L4-5 dural tear. C-arm fluoroscopy was then brought into  the field and using C-arm fluoroscopy then a hole made into the lateral aspect of the pedicle of L2 observed in the pedicle using ball-tipped nerve hook and hockey stick nerve probe initial entry was determined on fluoroscopy to be good position alignment so that a ball handled probe was then used to probe the left L2 pedicle to a depth of nearly 40 mm observed on C-arm fluoroscopy to be beyond the midpoint of the lumbar vertebra and then position alignment within the left L2 pedicle this was then removed and the pedicle channel probed demonstrating patency no sign of rupture the cortex of the pedicle. Tapping with a 4 mm screw then 5.0 mm x 40 mm  screw was placed on the left side at the L2 level. C-arm fluoroscopy was then brought into the field and using C-arm fluoroscopy then a hole made into the lateral aspect of the pedicle of L3 observed in the pedicle using ball tipped nerve hook and hockey stick nerve probe initial entry was determined on fluoroscopy to be good position alignment so that a ball handled probe was then used to probe the left L3 pedicle to a depth of nearly 45 mm observed on C-arm fluoroscopy to be beyond the midpoint of the lumbar vertebra and then position alignment within the left L3 pedicle this was then removed and the pedicle channel probed demonstrating patency no sign of rupture the cortex of the pedicle. Tapping with a 5 mm screw then 6.0 mm x 45 mm screw was placed on the left side at the L3 level C-arm fluoroscopy was then brought into the field and using C-arm fluoroscopy then a hole made into the lateral aspect of the pedicle of L4 observed in the pedicle using ball tipped nerve hook and hockey stick nerve probe initial entry was determined on fluoroscopy to be good position alignment so that a ball handled probe was then used to probe the left L4 pedicle to a depth of nearly 45 mm observed on C-arm fluoroscopy to be beyond the midpoint of the lumbar vertebra and then position alignment within the left L4 pedicle this was then removed and the pedicle channel probed demonstrating patency no sign of rupture the cortex of the pedicle. Tapping with a 5 mm screw then 6.0 mm x 45 mm screw was placed on the left side at the L4 level. The pedicle channel of L5 on the left probed demonstrating patency no sign of rupture the cortex of the pedicle. Previous screw for fixation of this level was measured as 6.0 mm x 45 mm screw so a 7.52mm x 45 mm was placed on the left side at the L5 level. C-arm fluoroscopy was then brought into the field and using C-arm fluoroscopy then the hole into the pedicle of S1 observed with ball-tipped probe  the previous S1 pedicle screw removed on this side was 7.0 mm x 45mm. A 8.36mm X 45mm predicle screw was placed on the left side at the S1 level. Attention then turned to the right side and changing to the sides of the table, the right-sided pedicle screws were passed in a similar fashion. C-arm fluoroscopy was used to localize the hole made in the lateral aspect of the pedicle of L2 on the right localizing the pedicle within the spinal canal with nerve hook and hockey-stick nerve probe carefully passed down the center of the L2 pedicle to a depth of nearly 45 mm. Observed on C-arm fluoroscopy to be in good position alignment channel was  probed with a ball-tipped probe ensure patency no sign of cortical disruption. Following tapping with a 5 mm  tap and a 5.0 x 40 mm screw was placed on the right side pedicle at L2. C-arm fluoroscopy was used to localize the hole made in the lateral aspect of the pedicle of L3 on the right localizing the pedicle within the spinal canal with nerve hook and hockey-stick nerve probe carefully passed down the center of the L3 pedicle to a depth of nearly 45 mm. Observed on C-arm fluoroscopy to be in good position alignment channel was probed with a ball-tipped probe ensure patency no sign of cortical disruption. Following tapping with a 5 mm tap and a 6.0 x 45 mm screw was placed on the right side pedicle at L3. C-arm fluoroscopy was used to localize the hole made in the lateral aspect of the pedicle of L 4 on the right localizing the pedicle within the spinal canal with nerve hook and hockey-stick nerve probe carefully passed down the center of the L 4 pedicle to a depth of nearly 40 mm. Observed on C-arm fluoroscopy to be in good position alignment channel was probed with a ball-tipped probe ensure patency no sign of cortical disruption. Following tapping with a 4 mm and 5 mm taps a 5.0 x 40 mm screw was placed on the right side pedicle at L 4. Attempts were made to place a pedicle on  the right side at the L5 level however the pedicle itself appeared to be to angled cranially for application of a screw so that after decompressing the right L4 and L5 nerve root this pedicle was not instrumented.C-arm fluoroscopy was used to localize the hole made in the lateral aspect of the pedicle of S1 on the right localizing the pedicle with a ball tipped probe carefully passed down the center of the S1 pedicle to a depth of nearly 40 mm. Observed on C-arm fluoroscopy to be in good position alignment channel was probed with a ball-tipped probe ensure patency no sign of cortical disruption. Previous right S1 pedicle screw measured 7.53mm x 40mm so that a 8.0 x 40 mm screw was placed on the right side pedicle at S1. Attention then turned to placement of the transforaminal lumbar interbody fusion cages. Using a Penfield 4 the right lateral aspect of the thecal sac at the L4-5 disc space was carefully freed up The thecal sac could then easily be retracted in the posterior lateral aspect of the L4-5 disc was exposed 15 blade scalpel used to incise the posterolateral disc and an osteotome used to resect a small portion of bone off the superior aspect of the posterior superior vertebral body of L5 in order to ease the entry into the L4-5 disc space. A  2mm kerrison rongeur was then able to be introduced in the disc space debrided it was quite narrow. 7 mm dilator was used to dialate the L4-5 disc space on the right side attempts were made to dilate further the 8 mm were successful and using small curettes and the disc space was debrided a minimal degenerative disc present in the endplates debrided to bleeding endplate bone. A 8 mm x 27mm lordotic Concorde cage was carefully packed with morcellized bone graft and the been harvested from previous laminotomies additional bone graft was then packed into the intervertebral disc space using the 7 mm trial  packed the graft multiple times. With this then a 8 mm x 27mm  lordotic cage was able to  be introduced into the disc space on the right side using the correct degree convergence and then impacted then subset beneath the outer aspect of the disc space by about 3 or 4 mm. Bleeding controlled using bipolar electrocautery thrombin soaked gel cottonoids. Then turned to the right L3-4 level similarly the exposure the posterior lateral aspect this was carried out using a Penfield 4 bipolar electrocautery to control small bleeders present. Derricho retractor used to retract the thecal sac and L4 nerve root a 15 blade scalpel was used to incise posterior lateral aspect of the disc the disc space at this level showed a rather severe narrowing posteriorly was more open anteriorly so that an osteotome again was used to resect a small portion the posterior superior lip of the vertebral body at L4  in order to gain ease of access into the L3-4 disc space. The space was debrided of degenerative disc material using pituitary along root the entire disc space was then debrided of degenerative disc material using pituitary rongeurs curettage down to bleeding bone endplates. Residual disc was resected using pituitary. This space was then carefully assess using spacers  a 9.49mm x 27mm cage provided the best fit the lordotic cage was chosen the intervertebral disc space was then packed with autogenous local bone graft that been harvested from the central laminectomy and this was pack multiple times and impacted with 7 mm trial cage apparatus. This provided excellent bone graft within the intervertebral disc space at L3-4 so that the permanent 9.0 mm cage by 27 mm was then packed with local bone graft placed into the intervertebral disc space and impacted into place in the correct degree of convergence. Bleeding controlled using bipolar electrocautery. The cage was subset beneath the posterior aspect of the L3-4 disc space by about 3-4 mm attempting to open the disc space on the right side than as  with the L4-5 level. Bleeding hemostasis then attention was turned to the right side at the L2-3 level the right side portion of the disc was excised on the right side then carefully retracting the thecal sac and the appropriate nerveL3 root  pituitary rongeurs were used to debride this cavity L2-3 level a lamina spreader was necessary in order to open the disc space wide enough to accept instruments to debride the intervertebral disc space. At the L2-3 level the disc was quite tight a 7 mm spreader was able to be introduced and then an 8mm spreader. 8 mm x 27mm cage was chosen parallel type. A vertebral disc space was debrided of degenerative disc material and endplate and a curet and pituitary rongeurs. This was completely local bone graft was packed into the disc space multiple time using the smaller 7 mm trial to impact the graft. Finally the 8 mm x 27 mm parallel Concorde cage was packed with local bone graft and this was then impacted into the disc space on the right side at L2-3 to correct the patient's scoliosis concavity. The cage was placed on the right side but also directed anteriorly in order to correct kyphosis noted to be present. Cage was subset beneath the posterior aspect of the disc space 2 or 3 mm. Observed on C-arm fluoroscopy to be in good position alignment. The cages at L2-3 L3-4 and L4-5 were placed anteriorly as best as possible the correct patient's kyphosis that was present. With this then the transforaminal lumbar interbody fusion portion of the case was completed bleeders were controlled using bipolar electrocautery thrombin-soaked Gelfoam were  appropriate. 5 screws on the left and 4 screws on the right and then each carefully aligned and tightened or loose and a slight amount to allow for placement of rods. Template was made along the right side first quarter inch titanium rod was then carefully contoured used using the template and divided appropriately. This was then placed into the  pedicle screws on the right extending from L2-S1 each of the capsule and carefully placed loosely tightened. Attention turned to the left side were similarly and then screws were carefully adjusted to allow for a better pattern screws to allow for placement of fixation rod template for the rod was then taken and a quarter inch titanium rod was then carefully contoured. This was able to be inserted into the right pedicle screw fasteners and then using a persuader to place caps onto the right L4 fasteners the upper 2 pedicle screw caps appeared to fit quite well. The right S1 rod fastener cap was then tightened 85 pounds then on the right side disc space at L4-5 and L3-4 and L2-3 screw fasteners compression was obtained on the right side between L4 and S1, L3 andL4, and L2 and L3 by compressing between the fasteners right hand side and tightening the screw caps 85 pounds. Similarly this was done on the left side at L5-S1, L4-5, L3-4 and L2-3 screws using slightl compression and tightened 85 pounds. Iirrigation was carried out with copious amounts of saline solution this was done throughout the case. Cell Saver was used during the case. A single cross-link for the Depuy expedium system was placed at the L3-4 level measured with the measuring tool and the appropriate A6 cross-link was then carefully contoured and applied to the rods and tightened again to 80 foot-pounds bilaterally using the appropriate torque screwdriver. Center screw on the cross-link was then carefully tightened 80 foot-pounds irrigation was carried out observation areas of dural tear demonstrated no leakage present. DuraSeal was then applied to each of the small dural leak areas providing for watertight seal. Permanent C-arm images were obtained in AP and lateral plane. Remaining local bone graft was then applied along the left lateral posterior lateral region extending from L2 through L5 transverse processes to the left L5-S1 postolateral fusion  mass. Excess Gelfoam was then removed the lumbodorsal musculature carefully exam debrided of any devitalized tissue following removal of Vicryl retractors were the bleeders were controlled using electrocautery and the area dorsal lumbar muscle were then approximated in the midline with interrupted #1 Vicryl sutures loose the dorsal fascia was reattached to the spinous process of L1 to superiorly and S1 inferiorly this was done with #1 Vicryl sutures. Subcutaneous layers then approximated using interrupted 0 Vicryl sutures and 2-0 Vicryl sutures. Skin was closed with a running subcutaneous stitch of 4-0 Vicryl Dermabond was applied then MedPlex bandage. All instrument and sponge counts were correct. The patient was then returned to a supine position on her bed reactivated extubated and returned to the recovery room in satisfactory condition.   Maud Deed PA-C perform the duties of assistant surgeon during this case. She was present from the beginning of the case to the end of the case assisting in transfer the patient from his stretcher to the OR table and back to the stretcher at the end of the case. Assisted in careful  retraction and suction of the laminectomy site delicate neural structures operating under the  operating room microscope. She performed closure of the incision from the fascia to the skin  applying the dressing.        NITKA,JAMES E  04/08/2013, 6:17 PM

## 2013-04-09 ENCOUNTER — Inpatient Hospital Stay (HOSPITAL_COMMUNITY): Payer: Medicare Other

## 2013-04-09 DIAGNOSIS — J95821 Acute postprocedural respiratory failure: Secondary | ICD-10-CM | POA: Diagnosis not present

## 2013-04-09 DIAGNOSIS — J9600 Acute respiratory failure, unspecified whether with hypoxia or hypercapnia: Secondary | ICD-10-CM | POA: Diagnosis not present

## 2013-04-09 LAB — BASIC METABOLIC PANEL
BUN: 15 mg/dL (ref 6–23)
Calcium: 6.7 mg/dL — ABNORMAL LOW (ref 8.4–10.5)
Creatinine, Ser: 0.79 mg/dL (ref 0.50–1.35)
GFR calc Af Amer: >90 mL/min (ref >90)
GFR calc non Af Amer: 83 mL/min — ABNORMAL LOW (ref >90)
Glucose, Bld: 147 mg/dL — ABNORMAL HIGH (ref 70–99)
Potassium: 3.4 mEq/L — ABNORMAL LOW (ref 3.5–5.1)

## 2013-04-09 LAB — PREPARE FRESH FROZEN PLASMA

## 2013-04-09 LAB — CBC
HCT: 29.3 % — ABNORMAL LOW (ref 39.0–52.0)
Hemoglobin: 10.6 g/dL — ABNORMAL LOW (ref 13.0–17.0)
MCH: 29.4 pg (ref 26.0–34.0)
MCHC: 36.2 g/dL — ABNORMAL HIGH (ref 30.0–36.0)
MCV: 81.2 fL (ref 78.0–100.0)
RDW: 13.5 % (ref 11.5–15.5)

## 2013-04-09 LAB — GLUCOSE, CAPILLARY
Glucose-Capillary: 138 mg/dL — ABNORMAL HIGH (ref 70–99)
Glucose-Capillary: 143 mg/dL — ABNORMAL HIGH (ref 70–99)

## 2013-04-09 MED ORDER — PANTOPRAZOLE SODIUM 40 MG IV SOLR
40.0000 mg | Freq: Two times a day (BID) | INTRAVENOUS | Status: DC
  Administered 2013-04-09 – 2013-04-12 (×6): 40 mg via INTRAVENOUS
  Filled 2013-04-09 (×10): qty 40

## 2013-04-09 MED ORDER — ONDANSETRON HCL 4 MG/2ML IJ SOLN: 4.0000 mg | Freq: Four times a day (QID) | INTRAMUSCULAR | Status: DC | PRN

## 2013-04-09 MED ORDER — SODIUM CHLORIDE 0.9 % IJ SOLN: 9.0000 mL | INTRAMUSCULAR | Status: DC | PRN

## 2013-04-09 MED ORDER — POTASSIUM CHLORIDE 20 MEQ/15ML (10%) PO LIQD
ORAL | Status: AC
  Filled 2013-04-09: qty 15

## 2013-04-09 MED ORDER — DIPHENHYDRAMINE HCL 12.5 MG/5ML PO ELIX
12.5000 mg | ORAL_SOLUTION | Freq: Four times a day (QID) | ORAL | Status: DC | PRN
  Filled 2013-04-09: qty 5

## 2013-04-09 MED ORDER — KCL IN DEXTROSE-NACL 40-5-0.45 MEQ/L-%-% IV SOLN
INTRAVENOUS | Status: DC
  Administered 2013-04-09 – 2013-04-12 (×3): via INTRAVENOUS
  Filled 2013-04-09 (×14): qty 1000

## 2013-04-09 MED ORDER — FAMOTIDINE 40 MG PO TABS
40.0000 mg | ORAL_TABLET | Freq: Every day | ORAL | Status: DC
  Filled 2013-04-09: qty 1

## 2013-04-09 MED ORDER — DIPHENHYDRAMINE HCL 50 MG/ML IJ SOLN: 12.5000 mg | Freq: Four times a day (QID) | INTRAMUSCULAR | Status: DC | PRN

## 2013-04-09 MED ORDER — FENTANYL 10 MCG/ML IV SOLN
INTRAVENOUS | Status: DC
  Administered 2013-04-09: 75 ug via INTRAVENOUS
  Administered 2013-04-09: 165 ug via INTRAVENOUS
  Administered 2013-04-09: 30 ug via INTRAVENOUS
  Administered 2013-04-10: 60 ug via INTRAVENOUS
  Administered 2013-04-10: 120 ug via INTRAVENOUS
  Administered 2013-04-10 (×2): via INTRAVENOUS
  Administered 2013-04-10: 135 ug via INTRAVENOUS
  Administered 2013-04-11: 60 ug via INTRAVENOUS
  Administered 2013-04-11: 45 ug via INTRAVENOUS
  Administered 2013-04-11: 60 ug via INTRAVENOUS
  Administered 2013-04-11: 30 ug via INTRAVENOUS
  Administered 2013-04-12: 07:00:00 via INTRAVENOUS
  Administered 2013-04-12: 90 ug via INTRAVENOUS
  Filled 2013-04-09 (×5): qty 50

## 2013-04-09 MED ORDER — NALOXONE HCL 0.4 MG/ML IJ SOLN: 0.4000 mg | INTRAMUSCULAR | Status: DC | PRN

## 2013-04-09 MED ORDER — POTASSIUM CHLORIDE 20 MEQ/15ML (10%) PO LIQD
20.0000 meq | ORAL | Status: AC
  Administered 2013-04-09: 20 meq

## 2013-04-09 MED FILL — Sodium Chloride Irrigation Soln 0.9%: Qty: 3000 | Status: AC

## 2013-04-09 MED FILL — Sodium Chloride IV Soln 0.9%: INTRAVENOUS | Qty: 1000 | Status: AC

## 2013-04-09 MED FILL — Heparin Sodium (Porcine) Inj 1000 Unit/ML: INTRAMUSCULAR | Qty: 30 | Status: AC

## 2013-04-09 NOTE — Consult Note (Signed)
PULMONARY  / CRITICAL CARE MEDICINE  Name: Charles [name] MRN: [id number] DOB: 01-Feb-1931    ADMISSION DATE:  04/08/2013 CONSULTATION DATE:  04/08/13  REFERRING MD :  Gaylord Shih PRIMARY SERVICE: Ortho  CHIEF COMPLAINT:  Post-op respiratory failure.  BRIEF PATIENT DESCRIPTION: 79 year old male with post-op respiratory failure after lumbar spine repair per ortho.  9 hours procedure for fusion.  PCCM consulted for post-op respiratory failure.  SIGNIFICANT EVENTS / STUDIES:  5/6 Respiratory failure post-op.  LINES / TUBES:  ET 5/6>>> 5/7  CULTURES: None  ANTIBIOTICS: None    SUBJECTIVE: Sedated and intubated.  Passed SBT  VITAL SIGNS: Temp:  [93.2 F (34 C)-98.7 F (37.1 C)] 97.1 F (36.2 C) (05/07 1210) Pulse Rate:  [53-68] 68 (05/07 1000) Resp:  [15-21] 21 (05/07 1155) BP: (85-151)/(42-67) 123/52 mmHg (05/07 1000) SpO2:  [98 %-100 %] 100 % (05/07 1155) FiO2 (%):  [40 %-50 %] 40 % (05/07 0800) Weight:  [84.6 kg (186 lb 8.2 oz)-93.3 kg (205 lb 11 oz)] 84.6 kg (186 lb 8.2 oz) (05/07 0500) HEMODYNAMICS:   VENTILATOR SETTINGS: PRVC 20, Tv 550, PEEP 5 and FiO2 of 100%. Vent Mode:  [-] PRVC FiO2 (%):  [40 %-50 %] 40 % Set Rate:  [20 bmp] 20 bmp Vt Set:  [550 mL] 550 mL PEEP:  [5 cmH20] 5 cmH20 Plateau Pressure:  [17 cmH20-18 cmH20] 17 cmH20 INTAKE / OUTPUT: Intake/Output     05/06 0701 - 05/07 0700 05/07 0701 - 05/08 0700   I.V. (mL/kg) 5908.3 (69.8) 53 (0.6)   Blood 2610    NG/GT 30    IV Piggyback 550    Total Intake(mL/kg) 9098.3 (107.5) 53 (0.6)   Urine (mL/kg/hr) 1525 (0.8)    Emesis/NG output 250 (0.1)    Blood 1800 (0.9)    Total Output 3575     Net +5523.3 +53         PHYSICAL EXAMINATION: General: morbidly obese on vent, NAD Neuro:  Sedated, MAEs HEENT:  ETT, facial swelling resolved Cardiovascular:  s1s2 rrr, no m/r/g Lungs:  diffuse rales, even/non-labored on vent Abdomen: abd nml Ext: warm, no edema   LABS:  Recent Labs Lab 04/08/13 1437  04/08/13 1707 04/08/13 1738 04/08/13 1830 04/08/13 2200 04/08/13 2210 04/09/13 0425  HGB 11.9* 11.5*  --   --  10.7*  --  10.6*  WBC  --  11.2*  --   --  8.2  --  9.5  PLT  --  95*  --   --  93*  --  109*  NA 139 135  --  134*  --   --  136  K 4.8 3.9  --  3.7  --   --  3.4*  CL  --  105  --  104  --   --  105  CO2  --  23  --  23  --   --  24  GLUCOSE 141* 153*  --  166*  --   --  147*  BUN  --  18  --  18  --   --  15  CREATININE  --  0.65  --  0.63  --   --  0.79  CALCIUM  --  7.2*  --  7.3*  --   --  6.7*  MG  --  1.7  --   --   --   --   --   AST  --  57*  --   --   --   --   --  ALT  --  20  --   --   --   --   --   ALKPHOS  --  49  --   --   --   --   --   BILITOT  --  1.1  --   --   --   --   --   PROT  --  4.8*  --   --   --   --   --   ALBUMIN  --  3.0*  --   --   --   --   --   APTT  --  30  --   --   --   --   --   INR  --  1.23  --   --   --   --   --   LATICACIDVEN  --   --   --   --   --  1.2  --   TROPONINI  --   --   --  <0.30  --   --   --   PHART  --   --  7.375  --   --   --   --   PCO2ART  --   --  37.7  --   --   --   --   PO2ART  --   --  103.0*  --   --   --   --     Recent Labs Lab 04/08/13 1953 04/09/13 0016 04/09/13 0431  GLUCAP 144* 138* 143*    CXR: NACPD  ASSESSMENT / PLAN:  PULMONARY A: Acute Post op Respiratory failure OSA - on CPAP at home  P:   Extubate Bronchial hygiene  CARDIOVASCULAR A:  CAD - noted on previous stress test HLD  P:  Resume PO meds when able   RENAL A:   No acute issues  P:     GASTROINTESTINAL / GU A:   GERD - chronic ranitidine SUP BPH  P:   Change PPI to H2RB  HEMATOLOGIC A:   Anemia - s/p transfusion in OR.  2 units PRBC's, 2 units cell saver, 5L NS  P:  -follow cbc   INFECTIOUS A:   No issues  P:     ENDOCRINE A:   Hyperglycemia - no hx of DM, post-op hyperglycemia resolved  P:   -SSI as needed for glu > 180   NEUROLOGIC A:   S/P Prolonged Spinal Surgery   Dural Puncture  Pain  Depression   P:   Change to PCA   CCM X 35 mins   Billy Fischer, MD ; [facility] service Mobile [phone].  After 5:30 PM or weekends, call [phone]  04/09/2013, 1:48 PM

## 2013-04-09 NOTE — Clinical Documentation Improvement (Signed)
Anemia Blood Loss Clarification  THIS DOCUMENT IS NOT A PERMANENT PART OF THE MEDICAL RECORD  RESPOND TO THE THIS QUERY, FOLLOW THE INSTRUCTIONS BELOW:  1. If needed, update documentation for the patient's encounter via the notes activity.  2. Access this query again and click edit on the In [employer].  3. After updating, or not, click F2 to complete all highlighted (required) fields concerning your review. Select "additional documentation in the medical record" OR "no additional documentation provided".  4. Click Sign note button.  5. The deficiency will fall out of your In Basket *Please let us know if you are not able to complete this workflow by phone or e-mail (listed below).        04/09/13  Dear Maud Deed Marton Redwood,   In an effort to better capture your patient's severity of illness, reflect appropriate length of stay and utilization of resources, a review of the patient medical record has revealed the following indicators.    Based on your clinical judgment, please clarify and document in a progress note and/or discharge summary the clinical condition associated with the following supporting information:  In responding to this query please exercise your independent judgment.  The fact that a query is asked, does not imply that any particular answer is desired or expected.     Possible Clinical Conditions?   " Expected Acute Blood Loss Anemia  " Acute Blood Loss Anemia  " Precipitous drop in Hematocrit  " Other Condition  " Cannot Clinically Determine     Risk Factors: EBL: per 5/06 Anesthesia record.   Supporting Information:  Diagnostics: H&H on 4/28:   12.2/35.3 H&H on 5/06:    7.1/21.0 H&H on 5/07:    10.6/29.3  Treatments: Transfusion:  oer 5/06 Anesthesia record: PRBC:  Cell saver:  IV fluids / plasma expanders: per 5/06 Anesthesia record: Albumin 5%:  . FFP:  . 0.9%: LR:     Reviewed:  additional documentation in the medical record  Thank You,  Marciano Sequin,  Clinical Documentation Specialist:  Phone: [phone]  Health Information Management [place]

## 2013-04-09 NOTE — Progress Notes (Signed)
Orthopedic Tech Progress Note Patient Details:  [name] 1931-03-08 [id number]  Patient ID: Charles [name], male   DOB: 09-01-1931, 79 y.o.   MRN: [id number]   Shawnie Pons 04/09/2013, 10:17 AM [place] lumbar fusion brace completed by bio-tech.

## 2013-04-09 NOTE — Progress Notes (Addendum)
PTOT  Cancellation Note  Patient Details Name: Charles [name] MRN: [id number] DOB: [birth year]   Cancelled Treatment:    Reason Eval/Treat Not Completed: Other (comment) (Bedrest order in same order set as PT/OT eval.  MD: please clarify activity level and when PT/OT can begin)   [name],Charles Beilfuss 04/09/2013, 8:30 AM [facility] [phone] (pager)

## 2013-04-09 NOTE — Progress Notes (Signed)
[facility] ADULT ICU REPLACEMENT PROTOCOL FOR AM LAB REPLACEMENT ONLY  The patient does apply for the [facility] Adult ICU Electrolyte Replacment Protocol based on the criteria listed below:   1. Is GFR >/= 40 ml/min? yes  Patient's GFR today is 83 2. Is urine output >/= 0.5 ml/kg/hr for the last 6 hours? yes Patient's UOP is 0.5 ml/kg/hr 3. Is BUN < 60 mg/dL? yes  Patient's BUN today is 15 4. Abnormal electrolyte(s): K 3.4 5. Ordered repletion with: per protocol 6. If a panic level lab has been reported, has the CCM MD in charge been notified? yes.   Physician:  Dr Emi Holes, Vincie Linn A 04/09/2013 5:28 AM

## 2013-04-09 NOTE — Progress Notes (Signed)
Patient ID: Charles [name], male   DOB: 07/06/1931, 79 y.o.   MRN: [id number] Subjective: 1 Day Post-Op Procedure(s) (LRB): Right TLIF L2-3, L3-4 and L4-5, Removal of hardware L5-S1, Segmental fixation L2 to S1, Interbody cages x3, local bone graft (N/A) Sleeping, on ventilator weaning protocol. Giving intermittant  IV narcotics. Hgb stabilizing. He is diuresing. Yesterday +5500 fluids Patient reports pain as intubated so not able to fully assess but appears mild.    Objective:   VITALS:  Temp:  [93.2 F (34 C)-98.7 F (37.1 C)] 98.7 F (37.1 C) (05/07 0821) Pulse Rate:  [53-68] 68 (05/07 1000) Resp:  [15-21] 15 (05/07 1000) BP: (85-151)/(42-67) 123/52 mmHg (05/07 1000) SpO2:  [98 %-100 %] 99 % (05/07 1000) FiO2 (%):  [40 %-50 %] 40 % (05/07 0800) Weight:  [84.6 kg (186 lb 8.2 oz)-93.3 kg (205 lb 11 oz)] 84.6 kg (186 lb 8.2 oz) (05/07 0500)  ABD soft Intact pulses distally Dorsiflexion/Plantar flexion intact Compartment soft   LABS  Recent Labs  04/08/13 1304 04/08/13 1437  04/08/13 1707 04/08/13 2200 04/09/13 0425  HGB 9.5* 11.9*  --  11.5* 10.7* 10.6*  WBC  --   --   < > 11.2* 8.2 9.5  PLT  --   --   < > 95* 93* 109*  < > = values in this interval not displayed.  Recent Labs  04/08/13 1830 04/09/13 0425  NA 134* 136  K 3.7 3.4*  CL 104 105  CO2 23 24  BUN 18 15  CREATININE 0.63 0.79  GLUCOSE 166* 147*    Recent Labs  04/08/13 1707  INR 1.23     Assessment/Plan: 1 Day Post-Op Procedure(s) (LRB): Right TLIF L2-3, L3-4 and L4-5, Removal of hardware L5-S1, Segmental fixation L2 to S1, Interbody cages x3, local bone graft (N/A)  Wean off ventilator today . Follow Hgb to assess for perioperative anemia of blood loss. Allow continued diuresis. HOB at 10 degrees due to dural tear repair. Reassess neuro status when extubated. Needs brace for when he is able to be upright. Will allow tomorrow increase height of HOB.

## 2013-04-09 NOTE — Procedures (Signed)
Extubation Procedure Note  Patient Details:   Name: Charles [name] DOB: 1931-07-05 MRN: [id number]   Airway Documentation:     Evaluation  O2 sats: stable throughout Complications: No apparent complications Patient did tolerate procedure well. Bilateral Breath Sounds: Clear;Diminished   Yes CPAP/PS-5/5 by Dr. Sung Amabile, orders to extubate Positive cuff leak  Newt Lukes 04/09/2013, 9:43 AM

## 2013-04-09 NOTE — Plan of Care (Signed)
Problem: Phase I Progression Outcomes Goal: Initial discharge plan identified Outcome: Completed/Met Date Met:  04/09/13 Home with wife.  Will likely need Home Health

## 2013-04-09 NOTE — Progress Notes (Signed)
04/09/2013 Martie Round, OTR/L Pager: [phone]

## 2013-04-10 LAB — CBC
HCT: 28.6 % — ABNORMAL LOW (ref 39.0–52.0)
MCH: 29.2 pg (ref 26.0–34.0)
MCV: 82.7 fL (ref 78.0–100.0)
Platelets: 108 10*3/uL — ABNORMAL LOW (ref 150–400)
RDW: 13.7 % (ref 11.5–15.5)

## 2013-04-10 LAB — BASIC METABOLIC PANEL
CO2: 23 mEq/L (ref 19–32)
Calcium: 7 mg/dL — ABNORMAL LOW (ref 8.4–10.5)
Creatinine, Ser: 0.8 mg/dL (ref 0.50–1.35)
Glucose, Bld: 134 mg/dL — ABNORMAL HIGH (ref 70–99)

## 2013-04-10 MED ORDER — SIMETHICONE 80 MG PO CHEW
80.0000 mg | CHEWABLE_TABLET | Freq: Four times a day (QID) | ORAL | Status: DC | PRN
  Filled 2013-04-10: qty 1

## 2013-04-10 MED ORDER — BUPIVACAINE HCL (PF) 0.5 % IJ SOLN
10.0000 mL | Freq: Once | INTRAMUSCULAR | Status: AC
  Administered 2013-04-11: 10 mL
  Filled 2013-04-10: qty 10

## 2013-04-10 MED ORDER — METHYLPREDNISOLONE ACETATE 40 MG/ML IJ SUSP
20.0000 mg | Freq: Once | INTRAMUSCULAR | Status: AC
Start: 2013-04-10 — End: 2013-04-11
  Administered 2013-04-11: 20 mg via INTRA_ARTICULAR
  Filled 2013-04-10 (×2): qty 1

## 2013-04-10 MED ORDER — BISACODYL 5 MG PO TBEC: 5.0000 mg | DELAYED_RELEASE_TABLET | Freq: Every day | ORAL | Status: DC | PRN

## 2013-04-10 NOTE — Progress Notes (Signed)
Report called to Samson Frederic, RN and met RN at bedside.  Patient transferred to 2618 from 2309 and attached to tele monitor without complication.  Patient's belongings including CiPAP machine and back brace traveled with him.

## 2013-04-10 NOTE — Evaluation (Signed)
Occupational Therapy Evaluation Patient Details Name: Charles [name] MRN: [id number] DOB: 02-02-1931 Today's Date: 04/10/2013 Time: 7425-9563 OT Time Calculation (min): 23 min  OT Assessment / Plan / Recommendation Clinical Impression   This 79 y.o. Male admitted for TLIF L2-3; L3-4; L4-5, removal of hardware L5-S1, segmental fixation L2-S1 with interbody cages x 3.  Pt. With post op failure to wean from vent.  He was extubated 04/09/13. Pt very lethargic during eval and activity limited this date to EOB sitting.  Pt resides at [facility] with his wife and plan is to discharge to SNF at [facility].  Pt will benefit from continued OT to maximize safety and independence with BADLs to allow him to return to modified independent level after SNF level rehab     OT Assessment  Patient needs continued OT Services    Follow Up Recommendations  SNF    Barriers to Discharge Decreased caregiver support    Equipment Recommendations  None recommended by OT    Recommendations for Other Services    Frequency  Min 2X/week    Precautions / Restrictions Precautions Precautions: Back Precaution Booklet Issued: No Precaution Comments: Pt instructed in back precautions Required Braces or Orthoses: Spinal Brace Spinal Brace: Lumbar corset Restrictions Weight Bearing Restrictions: No       ADL  Eating/Feeding: Moderate assistance Where Assessed - Eating/Feeding: Bed level Grooming: Maximal assistance;Wash/dry hands;Wash/dry face;Teeth care Where Assessed - Grooming: Supine, head of bed up Upper Body Bathing: +1 Total assistance Where Assessed - Upper Body Bathing: Supine, head of bed up Lower Body Bathing: +1 Total assistance Where Assessed - Lower Body Bathing: Supine, head of bed up;Rolling right and/or left Upper Body Dressing: +1 Total assistance Where Assessed - Upper Body Dressing: Supine, head of bed up Lower Body Dressing: +1 Total assistance Where Assessed - Lower Body  Dressing: Supine, head of bed up;Rolling right and/or left Toilet Transfer: +1 Total assistance (not tested due to EOB activity only) ADL Comments: Pt lethargic and minimally able to participate. Pt moved to EOB only per orders    OT Diagnosis: Generalized weakness;Cognitive deficits;Acute pain  OT Problem List: Decreased strength;Decreased activity tolerance;Impaired balance (sitting and/or standing);Decreased cognition;Decreased safety awareness;Decreased knowledge of use of DME or AE;Decreased knowledge of precautions;Cardiopulmonary status limiting activity;Pain OT Treatment Interventions: Self-care/ADL training;DME and/or AE instruction;Therapeutic activities;Patient/family education;Balance training   OT Goals Acute Rehab OT Goals OT Goal Formulation: With patient/family Time For Goal Achievement: 04/24/13 Potential to Achieve Goals: Good ADL Goals Pt Will Perform Grooming: with min assist;Standing at sink ADL Goal: Grooming - Progress: Goal set today Pt Will Perform Upper Body Bathing: with supervision;with set-up;Sitting, chair;Sitting, edge of bed ADL Goal: Upper Body Bathing - Progress: Goal set today Pt Will Perform Lower Body Bathing: with min assist;Sit to stand from chair;Sit to stand from bed;with adaptive equipment ADL Goal: Lower Body Bathing - Progress: Goal set today Pt Will Perform Upper Body Dressing: with set-up;with min assist;Sitting, chair;Sitting, bed ADL Goal: Upper Body Dressing - Progress: Goal set today Pt Will Perform Lower Body Dressing: with min assist;Sit to stand from chair;Sit to stand from bed;with adaptive equipment ADL Goal: Lower Body Dressing - Progress: Goal set today Pt Will Transfer to Toilet: with min assist;Ambulation;3-in-1;Maintaining back safety precautions ADL Goal: Toilet Transfer - Progress: Goal set today Pt Will Perform Toileting - Clothing Manipulation: with min assist;Standing ADL Goal: Toileting - Clothing Manipulation - Progress:  Goal set today Additional ADL Goal #1: Pt will be independent with back  precautions during ADLs  Visit Information  Last OT Received On: 04/10/13 Assistance Needed: +2 PT/OT Co-Evaluation/Treatment: Yes    Subjective Data  Subjective: "What am I supposed to do" Patient Stated Goal: Pt unable to state   Prior Functioning     Home Living Lives With: Spouse Available Help at Discharge: Skilled Nursing Facility (friends home healthcare unit ) Type of Home: Independent living facility Home Access: Level entry Home Layout: One level Home Adaptive Equipment: Walker - rolling Prior Function Level of Independence: Independent (walker for long distances) Able to Take Stairs?: Yes Vocation: Retired Musician: No difficulties         Vision/Perception     Copywriter, advertising Arousal/Alertness: Awake/alert Behavior During Therapy: Flat affect Overall Cognitive Status: Impaired/Different from baseline Area of Impairment: Following commands;Problem solving (likely secondary to medication) Following Commands: Follows one step commands with increased time Problem Solving: Slow processing;Decreased initiation;Requires verbal cues;Requires tactile cues    Extremity/Trunk Assessment Right Upper Extremity Assessment RUE ROM/Strength/Tone: Deficits;Due to pain RUE ROM/Strength/Tone Deficits: Pt with limited shoulder flexion - reports he is awaiting Cortisone injection for bursitis RUE Coordination: WFL - gross/fine motor Left Upper Extremity Assessment LUE ROM/Strength/Tone: Within functional levels LUE Coordination: WFL - gross/fine motor     Mobility Bed Mobility Bed Mobility: Rolling Left;Left Sidelying to Sit;Sitting - Scoot to [place] of Bed;Sit to Sidelying Left Rolling Left: 1: +2 Total assist Rolling Left: Patient Percentage: 20% Left Sidelying to Sit: With rails;HOB flat;1: +2 Total assist Left Sidelying to Sit: Patient Percentage: 20% Sitting - Scoot  to Edge of Bed: 1: +2 Total assist Sitting - Scoot to Edge of Bed: Patient Percentage: 20% Sit to Sidelying Left: 1: +2 Total assist;HOB flat;With rail Sit to Sidelying Left: Patient Percentage: 20% Details for Bed Mobility Assistance: Pt requires step by step cues and assist for all aspects of bed mobility.  Pt moves slowly and is slow to respond Transfers Transfers: Not assessed     Exercise     Balance Balance Balance Assessed: Yes Static Sitting Balance Static Sitting - Balance Support: Right upper extremity supported;Left upper extremity supported;Feet unsupported (pt lifts feet off floor) Static Sitting - Level of Assistance: 3: Mod assist;2: Max assist Static Sitting - Comment/# of Minutes: 10 mins EOB.  Pt. overall requiring mod A for balance - leans posteriorly.  At times he was able to initiate trunk control and required min A, and at times he required max A to maintain EOB sitting.  Pt maintains a posterior lean and picks feet up off the floor despite cues and assist to keep feet on floor   End of Session OT - End of Session Equipment Utilized During Treatment: Back brace Activity Tolerance: Patient limited by fatigue;Treatment limited secondary to medical complications (Comment) Patient left: in bed;with call bell/phone within reach;with family/visitor present Nurse Communication: Mobility status  GO     Jeani Hawking M 04/10/2013, 3:41 PM

## 2013-04-10 NOTE — Progress Notes (Signed)
Pt has done well post extubation CPAP (for OSA) orders clarified PCCM will sign off. Please call if we can be of further assistance   Billy Fischer, MD ; [facility] [phone].  After 5:30 PM or weekends, call [phone]

## 2013-04-10 NOTE — Progress Notes (Signed)
Patient ID: Charles [name], male   DOB: [birth year], 79 y.o.   MRN: [id number] Subjective: 2 Days Post-Op Procedure(s) (LRB): Right TLIF L2-3, L3-4 and L4-5, Removal of hardware L5-S1, Segmental fixation L2 to S1, Interbody cages x3, local bone graft (N/A) Awake, alert and Ox4. No headaches or photophobia, no nausea. No flatus. No BM.  Patient reports pain as moderate.    Objective:   VITALS:  Temp:  [97.1 F (36.2 C)-98.7 F (37.1 C)] 97.1 F (36.2 C) (05/07 1210) Pulse Rate:  [53-82] 65 (05/08 0600) Resp:  [15-24] 22 (05/08 0600) BP: (104-137)/(35-52) 136/48 mmHg (05/08 0600) SpO2:  [86 %-100 %] 100 % (05/08 0600) FiO2 (%):  [40 %] 40 % (05/07 0800) Weight:  [86.6 kg (190 lb 14.7 oz)] 86.6 kg (190 lb 14.7 oz) (05/08 0600)  Neurologically intact ABD soft Neurovascular intact Sensation intact distally Intact pulses distally Dorsiflexion/Plantar flexion intact Incision: no drainage No cellulitis present No redness, swelling or fluctuance at incision. Abdomen is mildly distended active BS.   LABS  Recent Labs  04/08/13 1437  04/08/13 1707 04/08/13 2200 04/09/13 0425 04/10/13 0400  HGB 11.9*  --  11.5* 10.7* 10.6* 10.1*  WBC  --   < > 11.2* 8.2 9.5 9.5  PLT  --   < > 95* 93* 109* 108*  < > = values in this interval not displayed.  Recent Labs  04/09/13 0425 04/10/13 0400  NA 136 137  K 3.4* 3.7  CL 105 108  CO2 24 23  BUN 15 13  CREATININE 0.79 0.80  GLUCOSE 147* 134*    Recent Labs  04/08/13 1707  INR 1.23     Assessment/Plan: 2 Days Post-Op Procedure(s) (LRB): Right TLIF L2-3, L3-4 and L4-5, Removal of hardware L5-S1, Segmental fixation L2 to S1, Interbody cages x3, local bone graft (N/A)  Advance diet Up with therapy Continue foley due to diuresing patient, strict I&O, urinary output monitoring and limited mobility Stepdown unit transfer. PT/OT sit at bedside today and begin ambulation and gait tomorrow.  Charles [name] E 04/10/2013, 7:57 AM

## 2013-04-10 NOTE — Evaluation (Signed)
Physical Therapy Evaluation Patient Details Name: Charles [name] MRN: [id number] DOB: [birth year] Today's Date: 04/10/2013 Time: 1191-4782 PT Time Calculation (min): 24 min  PT Assessment / Plan / Recommendation Clinical Impression  This 79 y.o. Male admitted for TLIF L2-3; L3-4; L4-5, removal of hardware L5-S1, segmental fixation L2-S1 with interbody cages x 3. Pt. With post op failure to wean from vent. He was extubated 04/09/13. Pt very lethargic during eval and activity limited this date to EOB sitting. Pt resides at [facility] with his wife and plan is to discharge to SNF at [facility]. Pt will benefit from skilled Pt to address deficits as indicated and maximize functional mobility.    PT Assessment  Patient needs continued PT services    Follow Up Recommendations  SNF    Does the patient have the potential to tolerate intense rehabilitation      Barriers to Discharge        Equipment Recommendations  Rolling walker with 5" wheels    Recommendations for Other Services     Frequency Min 2X/week    Precautions / Restrictions Precautions Precautions: Back Precaution Booklet Issued: No Precaution Comments: Pt instructed in back precautions Required Braces or Orthoses: Spinal Brace Spinal Brace: Lumbar corset Restrictions Weight Bearing Restrictions: No   Pertinent Vitals/Pain 4/10      Mobility  Bed Mobility Bed Mobility: Rolling Left;Left Sidelying to Sit;Sitting - Scoot to [place] of Bed;Sit to Sidelying Left Rolling Left: 1: +2 Total assist Rolling Left: Patient Percentage: 20% Left Sidelying to Sit: With rails;HOB flat;1: +2 Total assist Left Sidelying to Sit: Patient Percentage: 20% Sitting - Scoot to Edge of Bed: 1: +2 Total assist Sitting - Scoot to Edge of Bed: Patient Percentage: 20% Sit to Sidelying Left: 1: +2 Total assist;HOB flat;With rail Sit to Sidelying Left: Patient Percentage: 20% Details for Bed Mobility Assistance: Pt requires step by  step cues and assist for all aspects of bed mobility.  Pt moves slowly and is slow to respond Transfers Transfers: Not assessed Ambulation/Gait Ambulation/Gait Assistance: Not tested (comment)        PT Diagnosis: Difficulty walking;Acute pain  PT Problem List: Decreased strength;Decreased range of motion;Decreased activity tolerance;Decreased balance;Decreased mobility;Pain PT Treatment Interventions: DME instruction;Gait training;Functional mobility training;Therapeutic activities;Therapeutic exercise;Balance training;Patient/family education   PT Goals Acute Rehab PT Goals PT Goal Formulation: With patient Time For Goal Achievement: 04/24/13 Potential to Achieve Goals: Fair Pt will go Supine/Side to Sit: with supervision PT Goal: Supine/Side to Sit - Progress: Goal set today Pt will go Sit to Stand: with supervision PT Goal: Sit to Stand - Progress: Goal set today Pt will Ambulate: >150 feet;with supervision PT Goal: Ambulate - Progress: Goal set today  Visit Information  Last PT Received On: 04/10/13 Assistance Needed: +2    Subjective Data  Subjective: PT agreeable to PT Patient Stated [facility]   Prior Functioning  Home Living Lives With: Spouse Available Help at Discharge: [facility] Facility [facility] Type of Home: Independent living facility Home Access: Level entry Home Layout: One level Home Adaptive Equipment: Walker - rolling Prior Function Level of Independence: Independent (walker for long distances) Able to Take Stairs?: Yes Vocation: Retired Musician: No difficulties    Copywriter, advertising Arousal/Alertness: Awake/alert Behavior During Therapy: Flat affect Overall Cognitive Status: Impaired/Different from baseline Area of Impairment: Following commands;Problem solving (likely secondary to medication) Following Commands: Follows one step commands with increased time Problem  Solving:  Slow processing;Decreased initiation;Requires verbal cues;Requires tactile cues    Extremity/Trunk Assessment Right Upper Extremity Assessment RUE ROM/Strength/Tone: Deficits;Due to pain RUE ROM/Strength/Tone Deficits: Pt with limited shoulder flexion - reports he is awaiting Cortisone injection for bursitis RUE Coordination: WFL - gross/fine motor Left Upper Extremity Assessment LUE ROM/Strength/Tone: Within functional levels LUE Coordination: WFL - gross/fine motor Right Lower Extremity Assessment RLE ROM/Strength/Tone: WFL for tasks assessed Left Lower Extremity Assessment LLE ROM/Strength/Tone: WFL for tasks assessed   Balance Balance Balance Assessed: Yes Static Sitting Balance Static Sitting - Balance Support: Right upper extremity supported;Left upper extremity supported;Feet unsupported (pt lifts feet off floor) Static Sitting - Level of Assistance: 3: Mod assist;2: Max assist Static Sitting - Comment/# of Minutes: 10 mins EOB.  Pt. overall requiring mod A for balance - leans posteriorly.  At times he was able to initiate trunk control and required min A, and at times he required max A to maintain EOB sitting.  Pt maintains a posterior lean and picks feet up off the floor despite cues and assist to keep feet on floor  End of Session PT - End of Session Equipment Utilized During Treatment: Back brace Activity Tolerance: Patient limited by fatigue;Patient limited by pain Patient left: in bed;with call bell/phone within reach;with family/visitor present Nurse Communication: Mobility status  GP     Fabio Asa 04/10/2013, 4:21 PM Charlotte Crumb, PT DPT  [phone]

## 2013-04-11 DIAGNOSIS — D62 Acute posthemorrhagic anemia: Secondary | ICD-10-CM | POA: Diagnosis not present

## 2013-04-11 LAB — TYPE AND SCREEN
ABO/RH(D): O NEG
Antibody Screen: NEGATIVE
Unit division: 0
Unit division: 0
Unit division: 0
Unit division: 0

## 2013-04-11 LAB — CBC
HCT: 29.2 % — ABNORMAL LOW (ref 39.0–52.0)
MCH: 29.5 pg (ref 26.0–34.0)
MCHC: 34.6 g/dL (ref 30.0–36.0)
RDW: 13.3 % (ref 11.5–15.5)

## 2013-04-11 MED ORDER — METHYLPREDNISOLONE 4 MG PO KIT: 4.0000 mg | PACK | ORAL | Status: AC

## 2013-04-11 MED ORDER — METOCLOPRAMIDE HCL 5 MG/ML IJ SOLN
5.0000 mg | Freq: Three times a day (TID) | INTRAMUSCULAR | Status: AC
  Administered 2013-04-11 (×3): 5 mg via INTRAVENOUS
  Filled 2013-04-11 (×3): qty 1

## 2013-04-11 MED ORDER — METHYLPREDNISOLONE 4 MG PO KIT
8.0000 mg | PACK | Freq: Every evening | ORAL | Status: AC
  Administered 2013-04-12: 8 mg via ORAL

## 2013-04-11 MED ORDER — METHYLPREDNISOLONE 4 MG PO KIT
8.0000 mg | PACK | Freq: Every evening | ORAL | Status: AC
  Administered 2013-04-11: 8 mg via ORAL

## 2013-04-11 MED ORDER — METHYLPREDNISOLONE 4 MG PO KIT
4.0000 mg | PACK | ORAL | Status: AC
Start: 2013-04-11 — End: 2013-04-12

## 2013-04-11 MED ORDER — METHYLPREDNISOLONE 4 MG PO KIT
8.0000 mg | PACK | Freq: Every morning | ORAL | Status: AC
  Filled 2013-04-11: qty 21

## 2013-04-11 MED ORDER — METHYLPREDNISOLONE 4 MG PO KIT
4.0000 mg | PACK | Freq: Three times a day (TID) | ORAL | Status: AC
  Administered 2013-04-12 (×3): 4 mg via ORAL

## 2013-04-11 MED ORDER — METHYLPREDNISOLONE 4 MG PO KIT
4.0000 mg | PACK | Freq: Four times a day (QID) | ORAL | Status: DC
  Administered 2013-04-13 – 2013-04-14 (×6): 4 mg via ORAL

## 2013-04-11 MED ORDER — ASPIRIN 81 MG PO CHEW
81.0000 mg | CHEWABLE_TABLET | Freq: Every day | ORAL | Status: DC
  Administered 2013-04-11 – 2013-04-14 (×4): 81 mg via ORAL
  Filled 2013-04-11 (×4): qty 1

## 2013-04-11 NOTE — Progress Notes (Signed)
Clinical Social Work Department BRIEF PSYCHOSOCIAL ASSESSMENT 04/11/2013  Patient:  DENARIUS, SESLER     Account Number:  [id number]     Admit date:  04/08/2013  Clinical Social Worker:  Margaree Mackintosh  Date/Time:  04/11/2013 12:00 M  Referred by:  Physician  Date Referred:  04/11/2013 Referred for  SNF Placement   Other Referral:   Interview type:  Patient Other interview type:   With wife present.    PSYCHOSOCIAL DATA Living Status:  FACILITY Admitted from facility:  [facility] Level of care:  Independent Living Primary support name:  Jael Kostick: [phone] Primary support relationship to patient:  SPOUSE Degree of support available:   Adequate.    CURRENT CONCERNS Current Concerns  Post-Acute Placement   Other Concerns:    SOCIAL WORK ASSESSMENT / PLAN Clinical Social Worker received referral indicating pt is from [employer] [facility] and will require SNF at dc.  CSW reviewed chart and met with pt's and pt's wife at bedside.  CSW introduced self, explained role, and provided support.  Wife shared that she has contacted [place] at [facility] to notify facility that pt will require SNF at dc.  CSW left message with Ninetta Lights at [facility].  CSW to continue to follow and assist as needed.   Assessment/plan status:  Information/Referral to [facility] Other assessment/ plan:   Information/referral to community resources:   SNF    PATIENT'S/FAMILY'S RESPONSE TO PLAN OF CARE: Pt and wife thanked CSW for intervention.

## 2013-04-11 NOTE — Progress Notes (Signed)
Occupational Therapy Treatment Patient Details Name: Charles [name] MRN: [id number] DOB: [birth year] Today's Date: 04/11/2013 Time: 0865-7846 OT Time Calculation (min): 28 min  OT Assessment / Plan / Recommendation Comments on Treatment Session  Pt somewhat improved from yesterday, but remains lethargic with slow processing.  Pt. Requires min a for sitting balance, and total assist of 2 for standing.  Pt. Noted to lean to Rt. In sitting and standing with Rt. Sided weakness noted - RN notified.  Will continue to follow.      Follow Up Recommendations  SNF    Barriers to Discharge       Equipment Recommendations  None recommended by OT    Recommendations for Other Services    Frequency Min 2X/week   Plan Discharge plan remains appropriate    Precautions / Restrictions Precautions Precautions: Back;Fall Precaution Comments: Patient able to recall 3/3 back precautions Required Braces or Orthoses: Spinal Brace Spinal Brace: Lumbar corset;Applied in sitting position Restrictions Weight Bearing Restrictions: No   Pertinent Vitals/Pain     ADL  ADL Comments: Pt continues to be slow to process info and is sluggish.  Leans to Rt. in sitting and standing.  Noted pt with difficulty placing Rt. hand on walker, unable to extend Rt. knee in standing, and when rubbing Rt. leg, index and ring finger keep flexing (curling under) rather than remaining extended.  Pt. with difficulty with reach Rt. UE    OT Diagnosis:    OT Problem List:   OT Treatment Interventions:     OT Goals ADL Goals Pt Will Perform Grooming: with min assist;Standing at sink Pt Will Perform Upper Body Bathing: with supervision;with set-up;Sitting, chair;Sitting, edge of bed Pt Will Perform Lower Body Bathing: with min assist;Sit to stand from chair;Sit to stand from bed;with adaptive equipment Pt Will Perform Upper Body Dressing: with set-up;with min assist;Sitting, chair;Sitting, bed Pt Will Perform Lower Body  Dressing: with min assist;Sit to stand from chair;Sit to stand from bed;with adaptive equipment Pt Will Transfer to Toilet: with min assist;Ambulation;3-in-1;Maintaining back safety precautions ADL Goal: Toilet Transfer - Progress: Progressing toward goals Pt Will Perform Toileting - Clothing Manipulation: with min assist;Standing Additional ADL Goal #1: Pt will be independent with back precautions during ADLs  Visit Information  Last OT Received On: 04/11/13 Assistance Needed: +2 PT/OT Co-Evaluation/Treatment: Yes    Subjective Data      Prior Functioning       Cognition  Cognition Arousal/Alertness: Awake/alert Behavior During Therapy: Flat affect Overall Cognitive Status: Impaired/Different from baseline Area of Impairment: Following commands;Problem solving Following Commands: Follows one step commands with increased time Problem Solving: Slow processing;Decreased initiation;Requires verbal cues;Requires tactile cues    Mobility  Bed Mobility Bed Mobility: Rolling Right;Right Sidelying to Sit;Sitting - Scoot to [place] of Bed;Sit to Sidelying Right Rolling Right: 2: Max assist;With rail Right Sidelying to Sit: 1: +2 Total assist;HOB flat Right Sidelying to Sit: Patient Percentage: 30% Sitting - Scoot to Edge of Bed: 1: +2 Total assist Sitting - Scoot to Edge of Bed: Patient Percentage: 20% Sit to Sidelying Right: 1: +2 Total assist;HOB flat Sit to Sidelying Right: Patient Percentage: 20% Details for Bed Mobility Assistance: Verbal and tactile cues for each step of bed mobility.  Used bed pad to assist with rolling and scooting to EOB.  Patient sat EOB x 5 minutes, leaning to right and forward.  Slow to process instructions. Transfers Sit to Stand: 1: +2 Total assist;With upper extremity assist;From bed Sit to Stand: Patient Percentage:  40% Stand to Sit: 1: +2 Total assist;With upper extremity assist;To bed Stand to Sit: Patient Percentage: 40% Details for Transfer  Assistance: Verbal cues for hand placement.  Required +2 assist to come to standing.  Physical assist/cues at chest and hips to stand upright.  Noted leaning again to right side, unable to straighten right LE.  Left knee extended completely, causing hips to rotate.  Worked for 3 minutes on standing posture/balance.  Patient fatigued, and returned to sitting.    Exercises      Balance Balance Balance Assessed: Yes Static Sitting Balance Static Sitting - Balance Support: Bilateral upper extremity supported;Feet supported Static Sitting - Level of Assistance: 4: Min assist Static Sitting - Comment/# of Minutes: 5 minutes.  Patient leaning to right side and forward.  Verbal and tactile cues to bring trunk to midline, and hold head upright. Static Standing Balance Static Standing - Balance Support: Right upper extremity supported;Left upper extremity supported (Using RW) Static Standing - Level of Assistance: 1: +2 Total assist Static Standing - Comment/# of Minutes: 3 minutes.  Patient leaning to right, with right knee and hip remaining flexed.  Patient unable to completely extend RLE.  Worked on trunk and hip extension to facilitate upright midline posture.   End of Session OT - End of Session Equipment Utilized During Treatment: Back brace Activity Tolerance: Patient limited by fatigue Patient left: in bed;with call bell/phone within reach;with family/visitor present Nurse Communication: Other (comment) (Rt. sided weakness)  GO     Charles [name] M 04/11/2013, 7:59 PM

## 2013-04-11 NOTE — Progress Notes (Signed)
Physical Therapy Treatment Patient Details Name: Charles [name] MRN: [id number] DOB: 10-27-1931 Today's Date: 04/11/2013 Time: 4098-1191 PT Time Calculation (min): 28 min  PT Assessment / Plan / Recommendation Comments on Treatment Session  Patient continues to struggle with mobility, requiring +2 assist for mobility/standing.  Today, noted difficulty moving RUE/RLE with decreased quality of movement/proprioception/sensation.  Impacting mobility with leaning to right in sitting and standing, and inability to extend right hip/knee in standing.  RN notified    Follow Up Recommendations  SNF     Does the patient have the potential to tolerate intense rehabilitation     Barriers to Discharge        Equipment Recommendations  Rolling walker with 5" wheels    Recommendations for Other Services    Frequency Min 2X/week   Plan Discharge plan remains appropriate;Frequency remains appropriate    Precautions / Restrictions Precautions Precautions: Back;Fall Precaution Comments: Patient able to recall 3/3 back precautions Required Braces or Orthoses: Spinal Brace Spinal Brace: Lumbar corset;Applied in sitting position Restrictions Weight Bearing Restrictions: No   Pertinent Vitals/Pain     Mobility  Bed Mobility Bed Mobility: Rolling Right;Right Sidelying to Sit;Sitting - Scoot to [place] of Bed;Sit to Sidelying Right Rolling Right: 2: Max assist;With rail Right Sidelying to Sit: 1: +2 Total assist;HOB flat Right Sidelying to Sit: Patient Percentage: 30% Sitting - Scoot to Edge of Bed: 1: +2 Total assist Sitting - Scoot to Edge of Bed: Patient Percentage: 20% Sit to Sidelying Right: 1: +2 Total assist;HOB flat Sit to Sidelying Right: Patient Percentage: 20% Details for Bed Mobility Assistance: Verbal and tactile cues for each step of bed mobility.  Used bed pad to assist with rolling and scooting to EOB.  Patient sat EOB x 5 minutes, leaning to right and forward.  Slow to process  instructions. Transfers Transfers: Sit to Stand;Stand to Sit Sit to Stand: 1: +2 Total assist;With upper extremity assist;From bed Sit to Stand: Patient Percentage: 40% Stand to Sit: 1: +2 Total assist;With upper extremity assist;To bed Stand to Sit: Patient Percentage: 40% Details for Transfer Assistance: Verbal cues for hand placement.  Required +2 assist to come to standing.  Physical assist/cues at chest and hips to stand upright.  Noted leaning again to right side, unable to straighten right LE.  Left knee extended completely, causing hips to rotate.  Worked for 3 minutes on standing posture/balance.  Patient fatigued, and returned to sitting. Ambulation/Gait Ambulation/Gait Assistance: Not tested (comment)      PT Goals Acute Rehab PT Goals Pt will go Supine/Side to Sit: with supervision PT Goal: Supine/Side to Sit - Progress: Progressing toward goal Pt will go Sit to Stand: with supervision PT Goal: Sit to Stand - Progress: Progressing toward goal  Visit Information  Last PT Received On: 04/11/13 Assistance Needed: +2 PT/OT Co-Evaluation/Treatment: Yes    Subjective Data  Subjective: Patient reports he had a "shot" in his shoulder earlier.   Cognition  Cognition Arousal/Alertness: Awake/alert Behavior During Therapy: Flat affect Overall Cognitive Status: Impaired/Different from baseline Area of Impairment: Following commands;Problem solving Following Commands: Follows one step commands with increased time Problem Solving: Slow processing;Decreased initiation;Requires verbal cues;Requires tactile cues    Balance  Balance Balance Assessed: Yes Static Sitting Balance Static Sitting - Balance Support: Bilateral upper extremity supported;Feet supported Static Sitting - Level of Assistance: 4: Min assist Static Sitting - Comment/# of Minutes: 5 minutes.  Patient leaning to right side and forward.  Verbal and tactile cues to bring trunk  to midline, and hold head  upright. Static Standing Balance Static Standing - Balance Support: Right upper extremity supported;Left upper extremity supported (Using RW) Static Standing - Level of Assistance: 1: +2 Total assist Static Standing - Comment/# of Minutes: 3 minutes.  Patient leaning to right, with right knee and hip remaining flexed.  Patient unable to completely extend RLE.  Worked on trunk and hip extension to facilitate upright midline posture.  End of Session PT - End of Session Equipment Utilized During Treatment: Back brace Activity Tolerance: Patient limited by fatigue;Patient limited by pain Patient left: in bed;with call bell/phone within reach;with family/visitor present;with nursing in room Nurse Communication: Mobility status (Noted right sided weakness/sensory deficit)   GP     Vena Austria 04/11/2013, 4:47 PM Durenda Hurt. Renaldo Fiddler, [facility] Acute Rehab Services Pager [phone]

## 2013-04-11 NOTE — Progress Notes (Addendum)
Notified Dr. Otelia Sergeant of signs of weakness reported by the physical  Therapist.  States diff extending rt. Knee; moves all ext. Slowly. Noted weakness to rt. Hand and knee. In rm to assess pt. Wife present. Able to move all extremities with mod . Strength. Able to push against my hands with feet. Follows commands. PCA fentanyl in progress prn. Ok. To move pt. No facial drooping. To Ortho floor per Dr. Otelia Sergeant.

## 2013-04-11 NOTE — Progress Notes (Addendum)
Patient ID: Charles [name], male   DOB: 1930-12-09, 79 y.o.   MRN: [id number] C/o right shoulder pain, injected right subacromial space this am but not really helping much. Left shoulder also with some discomfort. Nursing notes weakness in the RUE this is proximal And I believe is due to position during 8 hour surgery. He has right shoulder weakness and pain With ROM consistent with intrinsic right shoulder pain. Left shoulder with full ROM.  Able to lift both legs and foot off of the bed No complaints of numbness or headache. IVF at 75 cc/hr Abdomen is still bloated. Positive flatus in greater frequency and amounts. Abdomen is soft non tender.  Not able to tolerate po NSAIDs so will try Medrol dose pak. Tolerating pos without nausea or vomiting.  POD#3 Right L2-3, L3-4 and L4-5 TLIF with instrumented fusion L2 to S! Hypoactive bowel sounds. 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 Right UE weakness, is consistent with rotator cuff impingement likely due to position at OR. PeriOp acute blood loss anemia. Profound dependency in transfer and mobilizing  Dural tear 3 days post surgery with intraop dural repair no sign of persistance or nuchal signs. Likely will go to SNF Monday or Tuesday. Has not voided in large amounts since foley d/c today.  Plan: Follow bowel function return, Will give Medrol dose pak as he can not tolerate NSAIDS and is demonstrating joint pain And possibly cervical spondylosis pain. Social service to work on SNF, lives at [facility]. PT/OT to mobilize, may need to use knee immobilizer to assist in initial gait training. Consider Rehab eval. Bladder scan and replace foley

## 2013-04-11 NOTE — Progress Notes (Signed)
Patient ID: Charles [name], male   DOB: 18-Sep-1931, 79 y.o.   MRN: [id number] Subjective: 3 Days Post-Op Procedure(s) (LRB): Right TLIF L2-3, L3-4 and L4-5, Removal of hardware L5-S1, Segmental fixation L2 to S1, Interbody cages x3, local bone graft (N/A)Awake alert and Ox4. C/O pain right shoulder. Flatus in small amounts.  Patient reports pain as mild.    Objective:   VITALS:  Temp:  [98.3 F (36.8 C)-99.8 F (37.7 C)] 99.8 F (37.7 C) (05/08 1939) Pulse Rate:  [61-79] 61 (05/09 0326) Resp:  [18-29] 27 (05/09 0813) BP: (135-152)/(52-59) 141/53 mmHg (05/09 0326) SpO2:  [93 %-100 %] 100 % (05/09 0813) FiO2 (%):  [40 %] 40 % (05/08 1600) Weight:  [83.9 kg (184 lb 15.5 oz)-91.9 kg (202 lb 9.6 oz)] 91.9 kg (202 lb 9.6 oz) (05/09 0326)  Neurologically intact ABD soft Neurovascular intact Sensation intact distally Intact pulses distally Dorsiflexion/Plantar flexion intact Incision: dressing C/D/I Compartment soft Abdomen is mildly distended, Bowel sounds are present, non tender. Painful ROM right shoulder, good strength in abduction and IR and ER Positive impingement Sign right shoulder. CXR shows the right shoulder with mild A-C arthrosis, subacromial space Mildly narrowed with some sclerosis of bone at the sulcus between the greater tuberosity and The superolateral humeral head. Glenohumeral joint without significant degenerative joint  Changes.  LABS  Recent Labs  04/08/13 1707 04/08/13 2200 04/09/13 0425 04/10/13 0400 04/11/13 0330  HGB 11.5* 10.7* 10.6* 10.1* 10.1*  WBC 11.2* 8.2 9.5 9.5 9.7  PLT 95* 93* 109* 108* 103*    Recent Labs  04/09/13 0425 04/10/13 0400  NA 136 137  K 3.4* 3.7  CL 105 108  CO2 24 23  BUN 15 13  CREATININE 0.79 0.80  GLUCOSE 147* 134*    Recent Labs  04/08/13 1707  INR 1.23     Assessment/Plan: 3 Days Post-Op Procedure(s) (LRB): Right TLIF L2-3, L3-4 and L4-5, Removal of hardware L5-S1, Segmental fixation L2 to S1,  Interbody cages x3, local bone graft (N/A) Right shoulder bursitis and tendonitis.  Mild hypoactive bowels. Advance diet Up with therapy Discontinue foley. Transfer to orthopeadic floor  5North. Performed a right subacromial space injection with Marcaine 1/2% 4cc and 40mg  of depomedrol. Will try reglan to improve bowel activity. Abdulah Iqbal E 04/11/2013, 8:21 AM

## 2013-04-11 NOTE — Progress Notes (Signed)
Report of condition called to Herbert Seta, RN on [place].

## 2013-04-12 LAB — COMPREHENSIVE METABOLIC PANEL
ALT: 40 U/L (ref 0–53)
AST: 113 U/L — ABNORMAL HIGH (ref 0–37)
Creatinine, Ser: 0.64 mg/dL (ref 0.50–1.35)
GFR calc non Af Amer: >90 mL/min (ref >90)
Glucose, Bld: 154 mg/dL — ABNORMAL HIGH (ref 70–99)
Potassium: 4.4 mEq/L (ref 3.5–5.1)
Sodium: 134 mEq/L — ABNORMAL LOW (ref 135–145)
Total Bilirubin: 0.5 mg/dL (ref 0.3–1.2)

## 2013-04-12 MED ORDER — BISACODYL 10 MG RE SUPP
10.0000 mg | Freq: Once | RECTAL | Status: AC
  Administered 2013-04-12: 10 mg via RECTAL
  Filled 2013-04-12: qty 1

## 2013-04-12 NOTE — Progress Notes (Signed)
Assisted pt. With placing on cpap. Pt. Has his own mask from home and is tolerating well.

## 2013-04-12 NOTE — Progress Notes (Signed)
Wasted 48 mL of Fentanyl with Kaleen Odea due to PCA pump discontinued.

## 2013-04-12 NOTE — Progress Notes (Signed)
RT called to room about questions regarding CPAP. Pt does not wish to wear at this time. Pt states it is too much of a hassle to put on. He is also expressing that hospital machine is different from one at home. Placed pt back on N/C with no distress noted. Pt encouraged to call RT if pt wants to be placed back on CPAP.

## 2013-04-12 NOTE — Progress Notes (Signed)
Subjective: 4 Days Post-Op Procedure(s) (LRB): Right TLIF L2-3, L3-4 and L4-5, Removal of hardware L5-S1, Segmental fixation L2 to S1, Interbody cages x3, local bone graft (N/A) Patient reports pain as mild.    Using PCA and resp alarm going off.   Objective: Vital signs in last 24 hours: Temp:  [98.1 F (36.7 C)-98.8 F (37.1 C)] 98.1 F (36.7 C) (05/10 0542) Pulse Rate:  [55-67] 55 (05/10 0542) Resp:  [15-27] 18 (05/10 0542) BP: (116-139)/(51-99) 116/61 mmHg (05/10 0542) SpO2:  [90 %-100 %] 96 % (05/10 0542)  Intake/Output from previous day: 05/09 0701 - 05/10 0700 In: 1930 [P.O.:950; I.V.:980] Out: 3750 [Urine:3750] Intake/Output this shift:     Recent Labs  04/10/13 0400 04/11/13 0330  HGB 10.1* 10.1*    Recent Labs  04/10/13 0400 04/11/13 0330  WBC 9.5 9.7  RBC 3.46* 3.42*  HCT 28.6* 29.2*  PLT 108* 103*    Recent Labs  04/10/13 0400 04/12/13 0540  NA 137 134*  K 3.7 4.4  CL 108 107  CO2 23 19  BUN 13 10  CREATININE 0.80 0.64  GLUCOSE 134* 154*  CALCIUM 7.0* 7.4*   No results found for this basename: LABPT, INR,  in the last 72 hours  Neurologically intact  Assessment/Plan: 4 Days Post-Op Procedure(s) (LRB): Right TLIF L2-3, L3-4 and L4-5, Removal of hardware L5-S1, Segmental fixation L2 to S1, Interbody cages x3, local bone graft (N/A) Up with therapy     Saline lock IV  Nataki Mccrumb C 04/12/2013, 7:17 AM

## 2013-04-12 NOTE — Progress Notes (Signed)
Physical Therapy Treatment Patient Details Name: Charles [name] MRN: [id number] DOB: 1931/04/27 Today's Date: 04/12/2013 Time: 4098-1191 PT Time Calculation (min): 25 min  PT Assessment / Plan / Recommendation Comments on Treatment Session  Pt making good progress with mobility at this date however would cont to recommend ST-SNF to maximize functional recovery & independence with mobility.       Follow Up Recommendations  SNF     Does the patient have the potential to tolerate intense rehabilitation     Barriers to Discharge        Equipment Recommendations  Rolling walker with 5" wheels    Recommendations for Other Services    Frequency Min 5X/week   Plan Discharge plan remains appropriate;Frequency needs to be updated    Precautions / Restrictions Precautions Precautions: Back;Fall Precaution Comments: Patient able to recall 3/3 back precautions Required Braces or Orthoses: Spinal Brace Spinal Brace: Lumbar corset;Applied in sitting position Restrictions Weight Bearing Restrictions: No       Mobility  Bed Mobility Bed Mobility: Rolling Left;Left Sidelying to Sit;Sitting - Scoot to Edge of Bed Rolling Left: 3: Mod assist;With rail Left Sidelying to Sit: 1: +1 Total assist;HOB flat;With rails Sitting - Scoot to Edge of Bed: 1: +1 Total assist Details for Bed Mobility Assistance: Cues for sequencing & technique.  (A) to initiate & carryout rolling onto Lt side, advancement of LE's off EOB, lift shoulders/trunk to sitting upright.  Use of draw pad to bring hips forwards to EOB.  (A) initially for postural support until pt was able to support feet on floor.   Transfers Transfers: Sit to Stand;Stand to Sit;Stand Pivot Transfers Sit to Stand: 3: Mod assist;With upper extremity assist;From bed;From chair/3-in-1;With armrests Stand to Sit: 3: Mod assist;With upper extremity assist;With armrests;To chair/3-in-1 Stand Pivot Transfers: 4: Min assist Details for Transfer  Assistance: Cues for hand placement & technique.   Ambulation/Gait Ambulation/Gait Assistance: 4: Min assist Ambulation Distance (Feet): 100 Feet Assistive device: Rolling walker Ambulation/Gait Assistance Details: Cues for tall posture, body positioning inside RW, & increased awareness of obstacles on Lt side.  Pt bumping into objects on Lt.   Gait Pattern: Step-through pattern;Decreased stride length;Narrow base of support;Trunk flexed Stairs: No Wheelchair Mobility Wheelchair Mobility: No    Exercises     PT Diagnosis:    PT Problem List:   PT Treatment Interventions:     PT Goals Acute Rehab PT Goals Time For Goal Achievement: 04/24/13 Potential to Achieve Goals: Fair Pt will go Supine/Side to Sit: with supervision PT Goal: Supine/Side to Sit - Progress: Progressing toward goal Pt will go Sit to Stand: with supervision PT Goal: Sit to Stand - Progress: Progressing toward goal Pt will Ambulate: >150 feet;with supervision PT Goal: Ambulate - Progress: Progressing toward goal  Visit Information  Last PT Received On: 04/12/13 Assistance Needed: +1    Subjective Data      Cognition  Cognition Arousal/Alertness: Awake/alert Behavior During Therapy: Flat affect Area of Impairment: Following commands;Problem solving Following Commands: Follows multi-step commands inconsistently;Follows multi-step commands with increased time Problem Solving: Slow processing;Requires verbal cues;Requires tactile cues;Difficulty sequencing    Balance     End of Session PT - End of Session Equipment Utilized During Treatment: Gait belt;Back brace Activity Tolerance: Patient tolerated treatment well Patient left: in chair;with call bell/phone within reach;with family/visitor present Nurse Communication: Mobility status     Verdell Face, [place] [phone] 04/12/2013

## 2013-04-12 NOTE — Progress Notes (Signed)
I have reviewed note and agree with change in frequency. Durenda Hurt Renaldo Fiddler, [facility] Acute Rehab Services Pager [phone]

## 2013-04-13 MED ORDER — PANTOPRAZOLE SODIUM 20 MG PO TBEC
20.0000 mg | DELAYED_RELEASE_TABLET | Freq: Every day | ORAL | Status: DC
  Administered 2013-04-13 – 2013-04-14 (×2): 20 mg via ORAL
  Filled 2013-04-13 (×2): qty 1

## 2013-04-13 NOTE — Progress Notes (Signed)
Patient refused to wear CPAP tonight. Said it hasnt been able top wear the past couple night. Explained to patient that if he changed his mind to have them call us and we could set him on it for the night and try to adjust to his comfortablity

## 2013-04-13 NOTE — Progress Notes (Signed)
Pt having various complaints during the night. Complained of nasal congestion, sore throat, inability to fall asleep, and cold feet. Pt has good sensation in his feet, good pulses, and they are warm. Received tylenol and robaxin with some relief of his back pain. Has been restless and has not slept well during the night.

## 2013-04-13 NOTE — Progress Notes (Signed)
Subjective: 5 Days Post-Op Procedure(s) (LRB): Right TLIF L2-3, L3-4 and L4-5, Removal of hardware L5-S1, Segmental fixation L2 to S1, Interbody cages x3, local bone graft (N/A) Patient reports pain as moderate.    Objective: Vital signs in last 24 hours: Temp:  [97.5 F (36.4 C)-98.8 F (37.1 C)] 98.6 F (37 C) (05/11 0700) Pulse Rate:  [60-70] 60 (05/11 0700) Resp:  [18] 18 (05/11 0700) BP: (131-153)/(50-55) 135/53 mmHg (05/11 0700) SpO2:  [98 %-100 %] 98 % (05/11 0700)  Intake/Output from previous day: 05/10 0701 - 05/11 0700 In: 1563 [P.O.:1560; I.V.:3] Out: 2850 [Urine:2850] Intake/Output this shift:     Recent Labs  04/11/13 0330  HGB 10.1*    Recent Labs  04/11/13 0330  WBC 9.7  RBC 3.42*  HCT 29.2*  PLT 103*    Recent Labs  04/12/13 0540  NA 134*  K 4.4  CL 107  CO2 19  BUN 10  CREATININE 0.64  GLUCOSE 154*  CALCIUM 7.4*   No results found for this basename: LABPT, INR,  in the last 72 hours  Intact pulses distally  Assessment/Plan: 5 Days Post-Op Procedure(s) (LRB): Right TLIF L2-3, L3-4 and L4-5, Removal of hardware L5-S1, Segmental fixation L2 to S1, Interbody cages x3, local bone graft (N/A) Up with therapy Right shoulder better,  Right quad stronger. Continue PT.  Has some tingling of his feet. Ambulating with walker.  Redford Behrle C 04/13/2013, 9:36 AM

## 2013-04-13 NOTE — Progress Notes (Signed)
Physical Therapy Treatment Patient Details Name: Charles [name] MRN: [id number] DOB: [birth year] Today's Date: 04/13/2013 Time: 8657-8469 PT Time Calculation (min): 20 min  PT Assessment / Plan / Recommendation Comments on Treatment Session  Pt making good progress with mobility at this date however would cont to recommend ST-SNF to maximize functional recovery & independence with mobility.       Follow Up Recommendations  SNF     Does the patient have the potential to tolerate intense rehabilitation     Barriers to Discharge        Equipment Recommendations  Rolling walker with 5" wheels    Recommendations for Other Services    Frequency Min 5X/week   Plan Discharge plan remains appropriate;Frequency needs to be updated    Precautions / Restrictions Precautions Precautions: Back;Fall Precaution Comments: Patient able to recall 3/3 back precautions Required Braces or Orthoses: Spinal Brace Spinal Brace: Lumbar corset;Applied in sitting position   Pertinent Vitals/Pain 5/10 back pain; RN gave meds    Mobility  Transfers Transfers: Sit to Stand;Stand to Sit Sit to Stand: 4: Min assist;From chair/3-in-1;With upper extremity assist Stand to Sit: 4: Min assist;To chair/3-in-1;With upper extremity assist Details for Transfer Assistance: Cues for hand placement & technique.   Ambulation/Gait Ambulation/Gait Assistance: 4: Min assist Ambulation Distance (Feet): 180 Feet Assistive device: Rolling walker Ambulation/Gait Assistance Details: Cues for posture, and RW proximity/positioning Gait Pattern: Step-through pattern;Decreased stride length;Narrow base of support;Trunk flexed    Exercises     PT Diagnosis:    PT Problem List:   PT Treatment Interventions:     PT Goals Acute Rehab PT Goals Time For Goal Achievement: 04/24/13 Potential to Achieve Goals: Fair Pt will Ambulate: >150 feet;with supervision PT Goal: Ambulate - Progress: Progressing toward goal  Visit  Information  Last PT Received On: 04/13/13 Assistance Needed: +1    Subjective Data  Subjective: Agreeable to ambulating   Cognition  Cognition Arousal/Alertness: Awake/alert Behavior During Therapy: Flat affect Overall Cognitive Status: Within Functional Limits for tasks assessed    Balance     End of Session PT - End of Session Equipment Utilized During Treatment: Back brace Activity Tolerance: Patient tolerated treatment well Patient left: with call bell/phone within reach (on 3in1 over commode) Nurse Communication: Mobility status;Other (comment) (pt ended session in bathroom; Selena Batten, NT notified)   GP     Van Clines [facility] [place], [place] [phone]  04/13/2013, 2:22 PM

## 2013-04-14 DIAGNOSIS — M4310 Spondylolisthesis, site unspecified: Secondary | ICD-10-CM | POA: Diagnosis not present

## 2013-04-14 DIAGNOSIS — F329 Major depressive disorder, single episode, unspecified: Secondary | ICD-10-CM | POA: Diagnosis not present

## 2013-04-14 DIAGNOSIS — M7541 Impingement syndrome of right shoulder: Secondary | ICD-10-CM | POA: Diagnosis not present

## 2013-04-14 DIAGNOSIS — D62 Acute posthemorrhagic anemia: Secondary | ICD-10-CM | POA: Diagnosis not present

## 2013-04-14 DIAGNOSIS — K219 Gastro-esophageal reflux disease without esophagitis: Secondary | ICD-10-CM | POA: Diagnosis not present

## 2013-04-14 DIAGNOSIS — M4150 Other secondary scoliosis, site unspecified: Secondary | ICD-10-CM | POA: Diagnosis not present

## 2013-04-14 DIAGNOSIS — M329 Systemic lupus erythematosus, unspecified: Secondary | ICD-10-CM | POA: Diagnosis not present

## 2013-04-14 DIAGNOSIS — M479 Spondylosis, unspecified: Secondary | ICD-10-CM | POA: Diagnosis not present

## 2013-04-14 DIAGNOSIS — M549 Dorsalgia, unspecified: Secondary | ICD-10-CM | POA: Diagnosis not present

## 2013-04-14 MED ORDER — FLEET ENEMA 7-19 GM/118ML RE ENEM
1.0000 | ENEMA | Freq: Once | RECTAL | Status: AC
  Administered 2013-04-14: 1 via RECTAL
  Filled 2013-04-14: qty 1

## 2013-04-14 MED ORDER — HYDROCODONE-ACETAMINOPHEN 5-325 MG PO TABS: 1.0000 | ORAL_TABLET | ORAL | Status: DC | PRN

## 2013-04-14 MED ORDER — METHOCARBAMOL 500 MG PO TABS: 500.0000 mg | ORAL_TABLET | Freq: Four times a day (QID) | ORAL | Status: DC | PRN

## 2013-04-14 MED ORDER — POLYETHYLENE GLYCOL 3350 17 G PO PACK: 17.0000 g | PACK | Freq: Every day | ORAL | Status: DC

## 2013-04-14 NOTE — Progress Notes (Signed)
Pt has been generally restless this evening. Has gotten out of bed several times setting off the bed alarm. Is not steady on his feet and requires one assist when up. Continues to have minor complaints of being cold, having nasal congestion, coughing and sneezing. Seems to relax when given attention.

## 2013-04-14 NOTE — Progress Notes (Signed)
Physical medicine and rehabilitation consult requested. Patient is a resident of [facility] independent living facility. Patient requested discharge to skilled nursing facility of [facility] for ongoing therapy prior to returning back to independent living. Recommendations have been made also by physical and occupational therapy for skilled nursing facility again prior to returning back to independent living

## 2013-04-14 NOTE — Progress Notes (Signed)
Physical Therapy Treatment Patient Details Name: REGINALD WEIDA MRN: [id number] DOB: 05/02/1931 Today's Date: 04/14/2013 Time: 4098-1191 PT Time Calculation (min): 32 min  PT Assessment / Plan / Recommendation Comments on Treatment Session  Pt making good progress with mobility at this date however would cont to recommend ST-SNF to maximize functional recovery & independence with mobility.       Follow Up Recommendations  SNF     Does the patient have the potential to tolerate intense rehabilitation     Barriers to Discharge        Equipment Recommendations  Rolling walker with 5" wheels    Recommendations for Other Services    Frequency Min 5X/week   Plan Discharge plan remains appropriate;Frequency needs to be updated    Precautions / Restrictions Precautions Precautions: Back;Fall Precaution Comments: Patient able to recall 3/3 back precautions Required Braces or Orthoses: Spinal Brace Spinal Brace: Lumbar corset;Applied in sitting position Restrictions Weight Bearing Restrictions: No   Pertinent Vitals/Pain no apparent distress     Mobility  Bed Mobility Left Sidelying to Sit: 4: Min assist Sitting - Scoot to Edge of Bed: 4: Min assist Details for Bed Mobility Assistance: Cues for technique. A for trunk support Transfers Sit to Stand: 4: Min assist;From chair/3-in-1;With upper extremity assist Stand to Sit: 4: Min assist;With upper extremity assist;To chair/3-in-1 Details for Transfer Assistance: Cues for hand placement & technique.   Ambulation/Gait Ambulation/Gait Assistance: 4: Min guard Ambulation Distance (Feet): 200 Feet Assistive device: Rolling walker Ambulation/Gait Assistance Details: Cues for posture Gait Pattern: Step-through pattern;Decreased stride length;Narrow base of support;Trunk flexed    Exercises     PT Diagnosis:    PT Problem List:   PT Treatment Interventions:     PT Goals Acute Rehab PT Goals PT Goal: Supine/Side to Sit -  Progress: Progressing toward goal PT Goal: Sit to Stand - Progress: Progressing toward goal PT Goal: Ambulate - Progress: Progressing toward goal  Visit Information  Last PT Received On: 04/14/13 Assistance Needed: +1    Subjective Data      Cognition  Cognition Arousal/Alertness: Awake/alert Behavior During Therapy: WFL for tasks assessed/performed Overall Cognitive Status: Within Functional Limits for tasks assessed    Balance     End of Session PT - End of Session Equipment Utilized During Treatment: Back brace Activity Tolerance: Patient tolerated treatment well Patient left: with call bell/phone within reach Nurse Communication: Mobility status;Other (comment)   GP     Fredrich Birks 04/14/2013, 11:59 AM  04/14/2013 Fredrich Birks PTA [phone] pager [phone] office

## 2013-04-14 NOTE — Discharge Summary (Signed)
Physician Discharge Summary  Patient ID: Charles [name] MRN: [id number] DOB/AGE: 1931-02-22 79 y.o.  Admit date: 04/08/2013 Discharge date: 04/14/2013  Admission Diagnoses:  Scoliosis or kyphoscoliosis L3-4 and L4-5 with right foraminal stenosis L4-5  Discharge Diagnoses:  Principal Problem:   Scoliosis or kyphoscoliosis Active Problems:   Spondylolisthesis of lumbar region   Lumbosacral spondylosis without myelopathy   Postoperative anemia due to acute blood loss   Acute respiratory failure   HTN (hypertension)   Altered mental status   OSA (obstructive sleep apnea)   Respiratory failure, post-operative   Impingement syndrome of right shoulder  same as above and including dural tear right L4-5  Past Medical History  Diagnosis Date  . GERD (gastroesophageal reflux disease)   . Cataract   . Depression   . Hyperlipidemia   . Osteoporosis   . Barrett's esophagus   . Lymphocytosis 06/2012  . Diverticulosis   . Vitamin D deficiency   . Diverticulosis   . BPH (benign prostatic hyperplasia)   . Murmur   . Constipation   . Arthritis   . Urinary retention with incomplete bladder emptying     pt uses bathroom and within 30 mins needs to go again  . Sleep apnea     wears CPAP  . CAD (coronary artery disease)     only on stress test; never had MI, PCI or CABG.  In 2008, LVEF was reportedly normal.     Surgeries: Procedure(s): Right TLIF L2-3, L3-4 and L4-5, Removal of hardware L5-S1, Segmental fixation L2 to S1, Interbody cages x3, local bone graft. Repair of dural tear of L4-5 right on 04/08/2013   Consultants (if any):    Discharged Condition: Improved  Hospital Course: Charles [name] is an 79 y.o. male who was admitted 04/08/2013 with a diagnosis of Scoliosis or kyphoscoliosis and went to the operating room on 04/08/2013 and underwent the above named procedures.    He was given perioperative antibiotics:  Anti-infectives   Start     Dose/Rate Route Frequency Ordered  Stop   04/08/13 2030  ceFAZolin (ANCEF) IVPB 1 g/50 mL premix     1 g 100 mL/hr over 30 Minutes Intravenous Every 8 hours 04/08/13 1707 04/09/13 0450   04/08/13 1220  ceFAZolin (ANCEF) IVPB 1 g/50 mL premix     1 g 100 mL/hr over 30 Minutes Intravenous  Once 04/08/13 1226 04/08/13 1220   04/08/13 0600  ceFAZolin (ANCEF) IVPB 2 g/50 mL premix     2 g 100 mL/hr over 30 Minutes Intravenous On call to O.R. 04/07/13 1413 04/08/13 0820    Pt was treated with ventilator support post op for acute respiratory failure.  He underwent weaning protocol managed by the Critical Care Team and was extubated without difficulty.  As he stabilized he was transferred to step down where PT and OT were initiated.  Pain was controlled with PCA analgesics initially and he was weaned to po analgesics gradually.  At discharge pt was taking Tylenol for discomfort.  He was taught donning and doffing of his lumbar brace and was independent using this.  Ambulating in hallway with walker well at time of discharge.  Pt developed left shoulder pain related to tendonitis and bursitis.  Pt was treated with subacromial injection of depomedrol with good relief of his symptoms.  Also started on Medrol dose pak which will be completed after discharge.  Pt had return of bowel function with assist of laxatives and enemas.  Having bowel movements at  time of discharge.  Eating a regular diet without nausea.   Wound was healing without drainage, erythema or edema.  Ecchymosis of flanks resolving slowly. Pt returned to normal neurovascular status at discharge with exam before discharge showing no focal weakness of upper or lower extremity.  Slight decrease in right hip flexion as compared to left noted at time of discharge.  He was given sequential compression devices, early ambulation for DVT prophylaxis.  He benefited maximally from the hospital stay and there were no complications.   Arrangements made for return to [facility] for  continued rehab.  Recent vital signs:  Filed Vitals:   04/14/13 0553  BP: 160/44  Pulse: 59  Temp: 98.6 F (37 C)  Resp: 18    Recent laboratory studies:  Lab Results  Component Value Date   HGB 10.1* 04/11/2013   HGB 10.1* 04/10/2013   HGB 10.6* 04/09/2013   Lab Results  Component Value Date   WBC 9.7 04/11/2013   PLT 103* 04/11/2013   Lab Results  Component Value Date   INR 1.23 04/08/2013   Lab Results  Component Value Date   NA 134* 04/12/2013   K 4.4 04/12/2013   CL 107 04/12/2013   CO2 19 04/12/2013   BUN 10 04/12/2013   CREATININE 0.64 04/12/2013   GLUCOSE 154* 04/12/2013    Discharge Medications:     Medication List    STOP taking these medications       cyclobenzaprine 5 MG tablet  Commonly known as:  FLEXERIL     nabumetone 500 MG tablet  Commonly known as:  RELAFEN      TAKE these medications       aspirin EC 81 MG tablet  Take 81 mg by mouth daily.     buPROPion 150 MG 12 hr tablet  Commonly known as:  WELLBUTRIN SR  Take 150 mg by mouth 2 (two) times daily.     calcium-vitamin D 500-200 MG-UNIT per tablet  Commonly known as:  OSCAL WITH D  Take 1 tablet by mouth 2 (two) times daily.     cholecalciferol 1000 UNITS tablet  Commonly known as:  VITAMIN D  Take 1,000 Units by mouth daily.     finasteride 5 MG tablet  Commonly known as:  PROSCAR  Take 5 mg by mouth daily.     fish oil-omega-3 fatty acids 1000 MG capsule  Take 1 g by mouth daily.     HYDROcodone-acetaminophen 5-325 MG per tablet  Commonly known as:  NORCO  Take 1-2 tablets by mouth every 4 (four) hours as needed for pain.     HYDROcodone-acetaminophen 5-325 MG per tablet  Commonly known as:  NORCO/VICODIN  Take 0.5-1 tablets by mouth every 6 (six) hours as needed for pain.     hydrOXYzine 50 MG tablet  Commonly known as:  ATARAX/VISTARIL  Take 50 mg by mouth at bedtime as needed (sleep).     lovastatin 20 MG tablet  Commonly known as:  MEVACOR  Take 1 tablet by mouth at  bedtime.     methocarbamol 500 MG tablet  Commonly known as:  ROBAXIN  Take 1 tablet (500 mg total) by mouth every 6 (six) hours as needed.     multivitamin with minerals tablet  Take 1 tablet by mouth daily.     omeprazole 40 MG capsule  Commonly known as:  PRILOSEC  Take 1 capsule (40 mg total) by mouth 2 (two) times daily.  polyethylene glycol packet  Commonly known as:  MIRALAX / GLYCOLAX  Take 17 g by mouth daily.     ranitidine 300 MG tablet  Commonly known as:  ZANTAC  Take 1 tablet (300 mg total) by mouth at bedtime.     tamsulosin 0.4 MG Caps  Commonly known as:  FLOMAX  Take 0.4 mg by mouth daily.     traMADol 50 MG tablet  Commonly known as:  ULTRAM  Take 100 mg by mouth 2 (two) times daily as needed for pain.        Diagnostic Studies: Dg Lumbar Spine 2-3 Views  04/08/2013  *RADIOLOGY REPORT*  Clinical Data: Lumbar fusion  DG C-ARM GT 120 MIN,LUMBAR SPINE - 2-3 VIEW  Comparison: Earlier film of the same day  Findings: A series of fluoroscopic spot images document placement bilateral pedicle screws at L2-3 S1 with vertical interconnecting hardware.  Graft markers project in the L2-L5 interspaces.  IMPRESSION: Extension of PLIF   L2- S1.   Original Report Authenticated By: D. Andria Rhein, MD    Dg Lumbar Spine 1 View  04/08/2013  *RADIOLOGY REPORT*  Clinical Data: TLIF L2-L5 with hardware removal.  LUMBAR SPINE - 1 VIEW  Comparison: Lumbar spine CT 01/10/2013.  Findings: Single cross-table lateral view at 0910 hours.  There is preexisting hardware at L5-S1.  Surgical instruments overlie the L2- L3 and L3-L4 interspinous spaces.  IMPRESSION: Intraoperative localization as described.   Original Report Authenticated By: Carey Bullocks, M.D.    Portable Chest Xray In Am  04/09/2013  *RADIOLOGY REPORT*  Clinical Data: Respiratory failure.  Ventilator.  PORTABLE CHEST - 1 VIEW  Comparison: 04/08/2013  Findings: Endotracheal tube is in good position.  NG tube tip is below  the diaphragm.  Pulmonary vascularity is within normal limits.  Right lung is clear.  Focal atelectasis/infiltrate at the left lung base medially.  This is unchanged.  IMPRESSION: No change.  Persistent atelectasis/infiltrate at the left base.   Original Report Authenticated By: Francene Boyers, M.D.    Portable Chest Xray  04/08/2013  *RADIOLOGY REPORT*  Clinical Data: Endotracheal tube placement  PORTABLE CHEST - 1 VIEW  Comparison: Chest radiograph 03/18/2010  Findings: Endotracheal tube is 3.5 cm from carina.  Normal cardiac silhouette.  There is a retrocardiac opacity.  Mild venous pulmonary congestion.  No pneumothorax.  IMPRESSION:  1.  Endotracheal tube in good position.  2.   Left retrocardiac opacity likely representing atelectasis but cannot exclude infiltrate.   Original Report Authenticated By: Genevive Bi, M.D.    Dg C-arm Gt 120 Min  04/08/2013  *RADIOLOGY REPORT*  Clinical Data: Lumbar fusion  DG C-ARM GT 120 MIN,LUMBAR SPINE - 2-3 VIEW  Comparison: Earlier film of the same day  Findings: A series of fluoroscopic spot images document placement bilateral pedicle screws at L2-3 S1 with vertical interconnecting hardware.  Graft markers project in the L2-L5 interspaces.  IMPRESSION: Extension of PLIF   L2- S1.   Original Report Authenticated By: D. Andria Rhein, MD     Disposition: [facility]      Discharge Orders   Future Appointments Provider Department Dept Phone   07/15/2013 2:00 PM Windell [facility] MEDICAL ONCOLOGY [phone]   Future Orders Complete By Expires     Call MD / Call 911  As directed     Comments:      If you experience chest pain or shortness of breath, CALL 911 and be  transported to the hospital emergency room.  If you develope a fever above 101 F, pus (white drainage) or increased drainage or redness at the wound, or calf pain, call your surgeon's office.    Constipation Prevention  As directed     Comments:       Drink plenty of fluids.  Prune juice may be helpful.  You may use a stool softener, such as Colace (over the counter) 100 mg twice a day.  Use MiraLax (over the counter) for constipation as needed.    Diet - low sodium heart healthy  As directed     Discharge instructions  As directed     Comments:      Call if there is increasing drainage, fever greater than 101.5, severe head aches, and worsening nausea or light sensitivity. If shortness of breath, bloody cough or chest tightness or pain go to an emergency room. No lifting greater than 10 lbs. Avoid bending, stooping and twisting. Use brace when sitting and out of bed even to go to bathroom. Walk in house for first 2 weeks then may start to get out slowly increasing distances up to one mile by 4-6 weeks post op. After 5 days may shower and change dressing following bathing with shower.When bathing remove the brace shower and replace brace before getting out of the shower. If drainage, keep dry dressing and do not bathe the incision, use an moisture impervious dressing. Please call and return for scheduled follow up appointment 2 weeks from the time of surgery.    Discharge med rec pharmacy consult  As directed     Comments:      Please give remaining supply of medrol dose pack for pt to complete after discharge.    Driving restrictions  As directed     Comments:      No driving    Increase activity slowly as tolerated  As directed     Lifting restrictions  As directed     Comments:      No lifting     Pt will continue to receive daily PT and OT for ambulation and gait training.  Walker to be used Lexicographer when pt is out of bed.   Please call office with any questions   Follow-up Information   Follow up with NITKA,JAMES E, MD In 2 weeks.   Contact information:   [address] [place] [address] [phone]        Signed: Wende Neighbors 04/14/2013, 10:51 AM

## 2013-04-14 NOTE — Progress Notes (Signed)
Subjective: 6 Days Post-Op Procedure(s) (LRB): Right TLIF L2-3, L3-4 and L4-5, Removal of hardware L5-S1, Segmental fixation L2 to S1, Interbody cages x3, local bone graft (N/A) Patient reports pain as mild.  Progressing well with activity Had BM after enema today.  Eating regular diet.   Left shoulder pain resolved with injection.  Denies leg pain Ready for transfer to [facility] today for continued rehab.  Objective: Vital signs in last 24 hours: Temp:  [97.8 F (36.6 C)-98.6 F (37 C)] 98.6 F (37 C) (05/12 0553) Pulse Rate:  [59-63] 59 (05/12 0553) Resp:  [18-20] 18 (05/12 0553) BP: (145-160)/(44-52) 160/44 mmHg (05/12 0553) SpO2:  [100 %] 100 % (05/12 0553)  Intake/Output from previous day: 05/11 0701 - 05/12 0700 In: 720 [P.O.:720] Out: 2300 [Urine:2300] Intake/Output this shift:    No results found for this basename: HGB,  in the last 72 hours No results found for this basename: WBC, RBC, HCT, PLT,  in the last 72 hours  Recent Labs  04/12/13 0540  NA 134*  K 4.4  CL 107  CO2 19  BUN 10  CREATININE 0.64  GLUCOSE 154*  CALCIUM 7.4*   No results found for this basename: LABPT, INR,  in the last 72 hours  Neurovascular intact Sensation intact distally Intact pulses distally Dorsiflexion/Plantar flexion intact Incision: no drainage Some flat rash over buttock .  Ecchymosis of flanks without cellulitis abd soft and nontender Slight weakness with right hip flexion, otherwise strength of lower and upper extremities intact.  Assessment/Plan: 6 Days Post-Op Procedure(s) (LRB): Right TLIF L2-3, L3-4 and L4-5, Removal of hardware L5-S1, Segmental fixation L2 to S1, Interbody cages x3, local bone graft (N/A) Discharge to SNF Continue with daily PT and OT for rehab. Aspen brace on when out of bed. Dressing change daily or as needed Pt may shower. OV 1 week with Dr Otelia Sergeant or as scheduled.  Ronnie Doo M 04/14/2013, 10:33 AM

## 2013-04-15 NOTE — Discharge Summary (Signed)
Patient D/C summary reviewed with Vernon PA-C. 

## 2013-04-15 NOTE — Progress Notes (Signed)
Patient examined and lab reviewed with Vernon PA-C. 

## 2013-04-18 ENCOUNTER — Non-Acute Institutional Stay (SKILLED_NURSING_FACILITY): Payer: Medicare Other | Admitting: Nurse Practitioner

## 2013-04-18 DIAGNOSIS — M4310 Spondylolisthesis, site unspecified: Secondary | ICD-10-CM | POA: Diagnosis not present

## 2013-04-18 DIAGNOSIS — D62 Acute posthemorrhagic anemia: Secondary | ICD-10-CM

## 2013-04-18 DIAGNOSIS — F329 Major depressive disorder, single episode, unspecified: Secondary | ICD-10-CM | POA: Diagnosis not present

## 2013-04-18 DIAGNOSIS — E785 Hyperlipidemia, unspecified: Secondary | ICD-10-CM | POA: Insufficient documentation

## 2013-04-18 DIAGNOSIS — M4316 Spondylolisthesis, lumbar region: Secondary | ICD-10-CM

## 2013-04-18 DIAGNOSIS — K219 Gastro-esophageal reflux disease without esophagitis: Secondary | ICD-10-CM | POA: Diagnosis not present

## 2013-04-18 DIAGNOSIS — K5900 Constipation, unspecified: Secondary | ICD-10-CM

## 2013-04-18 DIAGNOSIS — N400 Enlarged prostate without lower urinary tract symptoms: Secondary | ICD-10-CM

## 2013-04-18 NOTE — Assessment & Plan Note (Addendum)
Stable on Wellbutrin 150mg  daily. Will update TSH and BMP

## 2013-04-18 NOTE — Progress Notes (Signed)
Patient ID: Charles [name], male   DOB: 1931-09-30, 79 y.o.   MRN: [id number]  Chief Complaint:  Chief Complaint  Patient presents with  . Hospitalization Follow-up    pain and insomnia     HPI:   Hospitalized 04/08/13-04/14/13 for right TLIF L2-3, L3-4, and L4-5, removal of hardware L5-S1, segmental fixation L2 to S1, interbody cages x3, local bone graft, repair of dural tear of L4-5 04/08/13. Post op was complicated with acute respiratory failure and treated with ventilator support and weaned off successfully, left shoulder tendinitis which was treated with subacromial inj of depomedrol with good result, post op anemia with 4 units PRBC with Hgb 10s upon dc.  Problem List Items Addressed This Visit   GERD     Stable on Ranitidine 300mg  and Omeprazole 20mg  bid    CONSTIPATION     Had last BM this am, takes MiraLax daily.     Depression     Stable on Wellbutrin 150mg  daily. Will update TSH and BMP    BPH (benign prostatic hyperplasia)     No urinary retention, urinates 2x/night, takes Finasteride and Tamsulosin.     Other and unspecified hyperlipidemia     Takes Fish oil and Lovastatin, will check Lipid panel.     Spondylolisthesis of lumbar region - Primary (Chronic)     S/p right TLIF L2-3, L3-4, and L4-5, removal of hardware L5-S1, segmental fixation L2 to S1, interbody cages x3, local bone graft, repair of dural tear of L4-5 right on 04/08/13. Desires to take 1/2 Norco hs as needed to help with sleep in addition to Tramadol.     Postoperative anemia due to acute blood loss (Chronic)     Had blood transfusion 4 units post op, will f/u CBC      Current Outpatient Prescriptions on File Prior to Visit  Medication Sig Dispense Refill  . aspirin EC 81 MG tablet Take 81 mg by mouth daily.      Marland Kitchen buPROPion (WELLBUTRIN SR) 150 MG 12 hr tablet Take 150 mg by mouth 2 (two) times daily.       . calcium-vitamin D (OSCAL WITH D) 500-200 MG-UNIT per tablet Take 1 tablet by mouth 2 (two) times  daily.      . cholecalciferol (VITAMIN D) 1000 UNITS tablet Take 1,000 Units by mouth daily.      . finasteride (PROSCAR) 5 MG tablet Take 5 mg by mouth daily.       . fish oil-omega-3 fatty acids 1000 MG capsule Take 1 g by mouth daily.       Marland Kitchen HYDROcodone-acetaminophen (NORCO) 5-325 MG per tablet Take 1-2 tablets by mouth every 4 (four) hours as needed for pain.  30 tablet  0  . HYDROcodone-acetaminophen (NORCO/VICODIN) 5-325 MG per tablet Take 0.5-1 tablets by mouth every 6 (six) hours as needed for pain.      . hydrOXYzine (ATARAX/VISTARIL) 50 MG tablet Take 50 mg by mouth at bedtime as needed (sleep).       . lovastatin (MEVACOR) 20 MG tablet Take 1 tablet by mouth at bedtime.      . methocarbamol (ROBAXIN) 500 MG tablet Take 1 tablet (500 mg total) by mouth every 6 (six) hours as needed.  30 tablet  0  . Multiple Vitamins-Minerals (MULTIVITAMIN WITH MINERALS) tablet Take 1 tablet by mouth daily.        Marland Kitchen omeprazole (PRILOSEC) 40 MG capsule Take 1 capsule (40 mg total) by mouth 2 (two) times daily.  60 capsule  11  . polyethylene glycol (MIRALAX / GLYCOLAX) packet Take 17 g by mouth daily.  14 each    . ranitidine (ZANTAC) 300 MG tablet Take 1 tablet (300 mg total) by mouth at bedtime.  30 tablet  11  . Tamsulosin HCl (FLOMAX) 0.4 MG CAPS Take 0.4 mg by mouth daily.       . traMADol (ULTRAM) 50 MG tablet Take 100 mg by mouth 2 (two) times daily as needed for pain.        No current facility-administered medications on file prior to visit.    Past Medical History  Diagnosis Date  . GERD (gastroesophageal reflux disease)   . Cataract   . Depression   . Hyperlipidemia   . Osteoporosis   . Barrett's esophagus   . Lymphocytosis 06/2012  . Diverticulosis   . Vitamin D deficiency   . Diverticulosis   . BPH (benign prostatic hyperplasia)   . Murmur   . Constipation   . Arthritis   . Urinary retention with incomplete bladder emptying     pt uses bathroom and within 30 mins needs to go  again  . Sleep apnea     wears CPAP  . CAD (coronary artery disease)     only on stress test; never had MI, PCI or CABG.  In 2008, LVEF was reportedly normal.     Family History  Problem Relation Age of Onset  . Prostate cancer Father   . CAD Mother    History   Social History  . Marital Status: Married    Spouse Name: N/A    Number of Children: 3  . Years of Education: N/A   Occupational History  . Retired     retired Technical sales engineer   Social History Main Topics  . Smoking status: Never Smoker   . Smokeless tobacco: Never Used  . Alcohol Use: No  . Drug Use: No  . Sexually Active: Not on file   Other Topics Concern  . Not on file   Social History Narrative  . No narrative on file   Past Surgical History  Procedure Laterality Date  . Upper gastrointestinal endoscopy    . Repair spigelian hernia  2011  . Inguinal hernia repair      x4  . Back surgury      f  . Spinal fusion  1996  . Finger surgery Right     right thumb  . Colonoscopy    . Eye surgery Bilateral     Review of Systems:  Review of Systems  Constitutional: Positive for malaise/fatigue. Negative for fever, chills, weight loss and diaphoresis.  HENT: Positive for hearing loss. Negative for nosebleeds, congestion, sore throat and neck pain.   Eyes: Negative for pain, discharge and redness.  Respiratory: Negative for cough, sputum production and wheezing.   Cardiovascular: Negative for palpitations, orthopnea, claudication, leg swelling and PND.  Gastrointestinal: Negative for heartburn, nausea, vomiting, abdominal pain, diarrhea, constipation and blood in stool.  Genitourinary: Negative for dysuria, urgency, frequency, hematuria and flank pain.  Musculoskeletal: Positive for back pain. Negative for myalgias, joint pain and falls.  Skin: Negative for itching and rash.       Ecchymoses flanks.   Neurological: Positive for weakness (generalized. ). Negative for dizziness, tingling, tremors, sensory  change, speech change, focal weakness, seizures, loss of consciousness and headaches.  Endo/Heme/Allergies: Negative for environmental allergies and polydipsia. Does not bruise/bleed easily.  Psychiatric/Behavioral: Positive for depression. Negative for hallucinations and  memory loss. The patient has insomnia. The patient is not nervous/anxious.      Medications: Patient's Medications  New Prescriptions   No medications on file  Previous Medications   ASPIRIN EC 81 MG TABLET    Take 81 mg by mouth daily.   BUPROPION (WELLBUTRIN SR) 150 MG 12 HR TABLET    Take 150 mg by mouth 2 (two) times daily.    CALCIUM-VITAMIN D (OSCAL WITH D) 500-200 MG-UNIT PER TABLET    Take 1 tablet by mouth 2 (two) times daily.   CHOLECALCIFEROL (VITAMIN D) 1000 UNITS TABLET    Take 1,000 Units by mouth daily.   FINASTERIDE (PROSCAR) 5 MG TABLET    Take 5 mg by mouth daily.    FISH OIL-OMEGA-3 FATTY ACIDS 1000 MG CAPSULE    Take 1 g by mouth daily.    HYDROCODONE-ACETAMINOPHEN (NORCO) 5-325 MG PER TABLET    Take 1-2 tablets by mouth every 4 (four) hours as needed for pain.   HYDROCODONE-ACETAMINOPHEN (NORCO/VICODIN) 5-325 MG PER TABLET    Take 0.5-1 tablets by mouth every 6 (six) hours as needed for pain.   HYDROXYZINE (ATARAX/VISTARIL) 50 MG TABLET    Take 50 mg by mouth at bedtime as needed (sleep).    LOVASTATIN (MEVACOR) 20 MG TABLET    Take 1 tablet by mouth at bedtime.   METHOCARBAMOL (ROBAXIN) 500 MG TABLET    Take 1 tablet (500 mg total) by mouth every 6 (six) hours as needed.   MULTIPLE VITAMINS-MINERALS (MULTIVITAMIN WITH MINERALS) TABLET    Take 1 tablet by mouth daily.     OMEPRAZOLE (PRILOSEC) 40 MG CAPSULE    Take 1 capsule (40 mg total) by mouth 2 (two) times daily.   POLYETHYLENE GLYCOL (MIRALAX / GLYCOLAX) PACKET    Take 17 g by mouth daily.   RANITIDINE (ZANTAC) 300 MG TABLET    Take 1 tablet (300 mg total) by mouth at bedtime.   TAMSULOSIN HCL (FLOMAX) 0.4 MG CAPS    Take 0.4 mg by mouth daily.     TRAMADOL (ULTRAM) 50 MG TABLET    Take 100 mg by mouth 2 (two) times daily as needed for pain.   Modified Medications   No medications on file  Discontinued Medications   No medications on file     Physical Exam: Physical Exam  Constitutional: He is oriented to person, place, and time. He appears well-developed and well-nourished. No distress.  HENT:  Head: Normocephalic and atraumatic.  Nose: Nose normal.  Mouth/Throat: Oropharynx is clear and moist. No oropharyngeal exudate.  Eyes: Conjunctivae and EOM are normal. Pupils are equal, round, and reactive to light.  Neck: Normal range of motion. Neck supple. No JVD present. No thyromegaly present.  Cardiovascular: Normal rate, regular rhythm and normal heart sounds.   No murmur heard. Pulmonary/Chest: Effort normal and breath sounds normal. No respiratory distress. He has no wheezes. He has no rales.  Abdominal: Soft. Bowel sounds are normal. He exhibits no distension. There is no tenderness. There is no rebound.  Musculoskeletal: Normal range of motion. He exhibits no edema and no tenderness.  Lumbar spinal fusion--back brace  Lymphadenopathy:    He has no cervical adenopathy.  Neurological: He is alert and oriented to person, place, and time. He has normal reflexes. He displays normal reflexes. No cranial nerve deficit. He exhibits normal muscle tone. Coordination normal.  Skin: Skin is warm and dry. No rash noted. He is not diaphoretic. No erythema.  Psychiatric: He  has a normal mood and affect. His behavior is normal. Judgment and thought content normal.     Filed Vitals:   04/18/13 1521  BP: 144/80  Pulse: 62  Temp: 97.1 F (36.2 C)  TempSrc: Tympanic  Resp: 18      Labs reviewed: Basic Metabolic Panel:  Recent Labs  16/10/96 1437 04/08/13 1707  04/09/13 0425 04/10/13 0400 04/12/13 0540  NA 139 135  < > 136 137 134*  K 4.8 3.9  < > 3.4* 3.7 4.4  CL  --  105  < > 105 108 107  CO2  --  23  < > 24 23 19    GLUCOSE 141* 153*  < > 147* 134* 154*  BUN  --  18  < > 15 13 10   CREATININE  --  0.65  < > 0.79 0.80 0.64  CALCIUM  --  7.2*  < > 6.7* 7.0* 7.4*  MG  --  1.7  --   --   --   --   < > = values in this interval not displayed.  Liver Function Tests:  Recent Labs  03/31/13 1042 04/08/13 1707 04/12/13 0540  AST 53* 57* 113*  ALT 24 20 40  ALKPHOS 73 49 51  BILITOT 0.3 1.1 0.5  PROT 6.5 4.8* 5.2*  ALBUMIN 3.6 3.0* 2.2*    CBC:  Recent Labs  07/24/12 1030 11/19/12 1409 03/18/13 1357  04/09/13 0425 04/10/13 0400 04/11/13 0330  WBC 5.8 6.7 6.5  < > 9.5 9.5 9.7  NEUTROABS 3.7 4.1 4.1  --   --   --   --   HGB 14.2 14.4 14.7  < > 10.6* 10.1* 10.1*  HCT 41.6 42.9 43.4  < > 29.3* 28.6* 29.2*  MCV 91.0 91.2 89.8  < > 81.2 82.7 85.4  PLT 155 144 153  < > 109* 108* 103*  < > = values in this interval not displayed.  Anemia Panel: No results found for this basename: IRON, FOLATE, VITAMINB12,  in the last 8760 hours  Significant Diagnostic Results: 04/08/13 X-ray  IMPRESSION:  Extension of PLIF   L2- S1.  04/09/13  X-ray   IMPRESSION:  No change.  Persistent atelectasis/infiltrate at the left base.  01/10/13 CT w/o cm   IMPRESSION:   1.  Lumbar spondylosis and degenerative disc disease resulting and moderate impingement at L3-4 and mild impingement L2-3 as noted above. 2.  Stable fused subluxation at L5-S1.  There is degenerative posterior subluxation at the L3-4 level. 3.  Indeterminate L5-S1 level due to obscuration by streak artifact.      Assessment/Plan Spondylolisthesis of lumbar region S/p right TLIF L2-3, L3-4, and L4-5, removal of hardware L5-S1, segmental fixation L2 to S1, interbody cages x3, local bone graft, repair of dural tear of L4-5 right on 04/08/13. Desires to take 1/2 Norco hs as needed to help with sleep in addition to Tramadol.   Depression Stable on Wellbutrin 150mg  daily. Will update TSH and BMP  Postoperative anemia due to acute blood  loss Had blood transfusion 4 units post op, will f/u CBC  GERD Stable on Ranitidine 300mg  and Omeprazole 20mg  bid  BPH (benign prostatic hyperplasia) No urinary retention, urinates 2x/night, takes Finasteride and Tamsulosin.   CONSTIPATION Had last BM this am, takes MiraLax daily.   Other and unspecified hyperlipidemia Takes Fish oil and Lovastatin, will check Lipid panel.       Family/ staff Communication: monitor the patient for pain.  Goals of care: rehab and to return to [place]   Labs/tests ordered CBC, BMP, TSH, lipid panel.

## 2013-04-18 NOTE — Assessment & Plan Note (Addendum)
Had last BM this am, takes MiraLax daily.

## 2013-04-18 NOTE — Assessment & Plan Note (Signed)
Stable on Ranitidine 300mg  and Omeprazole 20mg  bid

## 2013-04-18 NOTE — Assessment & Plan Note (Signed)
Takes Fish oil and Lovastatin, will check Lipid panel.

## 2013-04-18 NOTE — Assessment & Plan Note (Signed)
Had blood transfusion 4 units post op, will f/u CBC

## 2013-04-18 NOTE — Assessment & Plan Note (Addendum)
S/p right TLIF L2-3, L3-4, and L4-5, removal of hardware L5-S1, segmental fixation L2 to S1, interbody cages x3, local bone graft, repair of dural tear of L4-5 right on 04/08/13. Desires to take 1/2 Norco hs as needed to help with sleep in addition to Tramadol. Back brace.

## 2013-04-18 NOTE — Assessment & Plan Note (Signed)
No urinary retention, urinates 2x/night, takes Finasteride and Tamsulosin.

## 2013-05-13 DIAGNOSIS — R269 Unspecified abnormalities of gait and mobility: Secondary | ICD-10-CM | POA: Diagnosis not present

## 2013-05-13 DIAGNOSIS — M6281 Muscle weakness (generalized): Secondary | ICD-10-CM | POA: Diagnosis not present

## 2013-05-14 DIAGNOSIS — R269 Unspecified abnormalities of gait and mobility: Secondary | ICD-10-CM | POA: Diagnosis not present

## 2013-05-14 DIAGNOSIS — M6281 Muscle weakness (generalized): Secondary | ICD-10-CM | POA: Diagnosis not present

## 2013-05-15 DIAGNOSIS — M6281 Muscle weakness (generalized): Secondary | ICD-10-CM | POA: Diagnosis not present

## 2013-05-15 DIAGNOSIS — R269 Unspecified abnormalities of gait and mobility: Secondary | ICD-10-CM | POA: Diagnosis not present

## 2013-05-20 DIAGNOSIS — R269 Unspecified abnormalities of gait and mobility: Secondary | ICD-10-CM | POA: Diagnosis not present

## 2013-05-20 DIAGNOSIS — M6281 Muscle weakness (generalized): Secondary | ICD-10-CM | POA: Diagnosis not present

## 2013-05-21 DIAGNOSIS — M48061 Spinal stenosis, lumbar region without neurogenic claudication: Secondary | ICD-10-CM | POA: Diagnosis not present

## 2013-05-21 DIAGNOSIS — M4120 Other idiopathic scoliosis, site unspecified: Secondary | ICD-10-CM | POA: Diagnosis not present

## 2013-05-22 DIAGNOSIS — M6281 Muscle weakness (generalized): Secondary | ICD-10-CM | POA: Diagnosis not present

## 2013-05-22 DIAGNOSIS — R269 Unspecified abnormalities of gait and mobility: Secondary | ICD-10-CM | POA: Diagnosis not present

## 2013-05-25 DIAGNOSIS — M6281 Muscle weakness (generalized): Secondary | ICD-10-CM | POA: Diagnosis not present

## 2013-05-25 DIAGNOSIS — R269 Unspecified abnormalities of gait and mobility: Secondary | ICD-10-CM | POA: Diagnosis not present

## 2013-05-29 DIAGNOSIS — M6281 Muscle weakness (generalized): Secondary | ICD-10-CM | POA: Diagnosis not present

## 2013-05-29 DIAGNOSIS — R269 Unspecified abnormalities of gait and mobility: Secondary | ICD-10-CM | POA: Diagnosis not present

## 2013-06-03 DIAGNOSIS — M6281 Muscle weakness (generalized): Secondary | ICD-10-CM | POA: Diagnosis not present

## 2013-06-05 DIAGNOSIS — M6281 Muscle weakness (generalized): Secondary | ICD-10-CM | POA: Diagnosis not present

## 2013-06-10 DIAGNOSIS — M6281 Muscle weakness (generalized): Secondary | ICD-10-CM | POA: Diagnosis not present

## 2013-06-12 DIAGNOSIS — M6281 Muscle weakness (generalized): Secondary | ICD-10-CM | POA: Diagnosis not present

## 2013-06-17 DIAGNOSIS — M6281 Muscle weakness (generalized): Secondary | ICD-10-CM | POA: Diagnosis not present

## 2013-06-19 DIAGNOSIS — M6281 Muscle weakness (generalized): Secondary | ICD-10-CM | POA: Diagnosis not present

## 2013-06-23 DIAGNOSIS — M48061 Spinal stenosis, lumbar region without neurogenic claudication: Secondary | ICD-10-CM | POA: Diagnosis not present

## 2013-06-23 DIAGNOSIS — IMO0002 Reserved for concepts with insufficient information to code with codable children: Secondary | ICD-10-CM | POA: Diagnosis not present

## 2013-06-24 DIAGNOSIS — M6281 Muscle weakness (generalized): Secondary | ICD-10-CM | POA: Diagnosis not present

## 2013-07-02 DIAGNOSIS — Z79899 Other long term (current) drug therapy: Secondary | ICD-10-CM | POA: Diagnosis not present

## 2013-07-02 DIAGNOSIS — E785 Hyperlipidemia, unspecified: Secondary | ICD-10-CM | POA: Diagnosis not present

## 2013-07-02 DIAGNOSIS — E559 Vitamin D deficiency, unspecified: Secondary | ICD-10-CM | POA: Diagnosis not present

## 2013-07-09 DIAGNOSIS — M810 Age-related osteoporosis without current pathological fracture: Secondary | ICD-10-CM | POA: Diagnosis not present

## 2013-07-09 DIAGNOSIS — I2510 Atherosclerotic heart disease of native coronary artery without angina pectoris: Secondary | ICD-10-CM | POA: Diagnosis not present

## 2013-07-09 DIAGNOSIS — G2581 Restless legs syndrome: Secondary | ICD-10-CM | POA: Diagnosis not present

## 2013-07-09 DIAGNOSIS — E559 Vitamin D deficiency, unspecified: Secondary | ICD-10-CM | POA: Diagnosis not present

## 2013-07-09 DIAGNOSIS — Z6826 Body mass index (BMI) 26.0-26.9, adult: Secondary | ICD-10-CM | POA: Diagnosis not present

## 2013-07-09 DIAGNOSIS — Z1331 Encounter for screening for depression: Secondary | ICD-10-CM | POA: Diagnosis not present

## 2013-07-09 DIAGNOSIS — D649 Anemia, unspecified: Secondary | ICD-10-CM | POA: Diagnosis not present

## 2013-07-09 DIAGNOSIS — E785 Hyperlipidemia, unspecified: Secondary | ICD-10-CM | POA: Diagnosis not present

## 2013-07-09 DIAGNOSIS — Z0000 Encounter for general adult medical examination without abnormal findings: Secondary | ICD-10-CM | POA: Diagnosis not present

## 2013-07-12 ENCOUNTER — Telehealth: Payer: Self-pay | Admitting: Hematology and Oncology

## 2013-07-12 NOTE — Telephone Encounter (Signed)
Returned call re cx 8/12 appt. Per pt he received a letter from [facility] stating that his elevated white cell has completed resolved itself and observation is no longer needed. appt cx'd perpt request.

## 2013-07-14 DIAGNOSIS — Z1212 Encounter for screening for malignant neoplasm of rectum: Secondary | ICD-10-CM | POA: Diagnosis not present

## 2013-07-15 ENCOUNTER — Other Ambulatory Visit: Payer: Medicare Other | Admitting: Lab

## 2013-07-16 DIAGNOSIS — H6120 Impacted cerumen, unspecified ear: Secondary | ICD-10-CM | POA: Diagnosis not present

## 2013-07-25 ENCOUNTER — Other Ambulatory Visit (INDEPENDENT_AMBULATORY_CARE_PROVIDER_SITE_OTHER): Payer: Medicare Other

## 2013-07-25 ENCOUNTER — Encounter: Payer: Self-pay | Admitting: Gastroenterology

## 2013-07-25 ENCOUNTER — Ambulatory Visit (INDEPENDENT_AMBULATORY_CARE_PROVIDER_SITE_OTHER): Payer: Medicare Other | Admitting: Gastroenterology

## 2013-07-25 VITALS — BP 110/62 | HR 72 | Ht 68.0 in | Wt 175.0 lb

## 2013-07-25 DIAGNOSIS — R6889 Other general symptoms and signs: Secondary | ICD-10-CM

## 2013-07-25 DIAGNOSIS — K2270 Barrett's esophagus without dysplasia: Secondary | ICD-10-CM | POA: Diagnosis not present

## 2013-07-25 DIAGNOSIS — D509 Iron deficiency anemia, unspecified: Secondary | ICD-10-CM

## 2013-07-25 LAB — IBC PANEL
Iron: 19 ug/dL — ABNORMAL LOW (ref 42–165)
Transferrin: 283.1 mg/dL (ref 212.0–360.0)

## 2013-07-25 LAB — VITAMIN B12: Vitamin B-12: 314 pg/mL (ref 211–911)

## 2013-07-25 MED ORDER — PEG-KCL-NACL-NASULF-NA ASC-C 100 G PO SOLR: 1.0000 | Freq: Once | ORAL | Status: DC

## 2013-07-25 NOTE — Progress Notes (Signed)
History of Present Illness: This is a 79 year old male accompanied by his wife. He was recently found to have a microcytic anemia on routine physical examination. Hemoglobin 11.7 MCV 78.3. Stool Hemoccults reportedly negative. He had back surgery in May 2014 and states he had donated 2 units of blood for himself prior to surgery. Following surgery he received those 2 units and an additional 2 units. He has ongoing constipation and takes Senokot as needed. Prior colonoscopy in September 2009 showed diverticulosis. His most recent upper endoscopy in October 2012 showed Barrett's esophagus and a hiatal hernia. Denies weight loss, abdominal pain, diarrhea, change in stool caliber, melena, hematochezia, nausea, vomiting, dysphagia, chest pain.  Physical Exam: General: Well developed , well nourished, no acute distress Head: Normocephalic and atraumatic Eyes:  sclerae anicteric, EOMI Ears: Normal auditory acuity Mouth: No deformity or lesions Lungs: Clear throughout to auscultation Heart: Regular rate and rhythm; no murmurs, rubs or bruits Abdomen: Back brace in place, not removed, soft, non tender and non distended. No masses, hepatosplenomegaly or hernias noted. Normal Bowel sounds Rectal: deferred to colonoscopy Musculoskeletal: Symmetrical with no gross deformities  Pulses:  Normal pulses noted Extremities: No clubbing, cyanosis, edema or deformities noted Neurological: Alert oriented x 4, grossly nonfocal Psychological:  Alert and cooperative. Normal mood and affect  Assessment and Recommendations:  1. Microcytic anemia. Presumed fe deficiency. Obtain iron, iron binding, ferritin, B12 and folate. Begin iron therapy if his iron studies show iron deficiency. Rule out occult gastrointestinal losses from colorectal neoplasms, AVMs, ulcers and other disorders. Schedule colonoscopy and upper endoscopy. The risks, benefits, and alternatives to colonoscopy with possible biopsy and possible polypectomy  were discussed with the patient and they consent to proceed.   2. Barrett's esophagus. GERD. Continue omeprazole 40 mg twice a day and ranitidine 300 mg at bedtime. The risks, benefits, and alternatives to endoscopy with possible biopsy and possible dilation were discussed with the patient and they consent to proceed.   3. Chronic constipation likely related to side effects from hydrocodone and possibly Flexeril and Ultram. He Senokot more frequently. If this is not effective may add MiraLax once or twice daily.

## 2013-07-25 NOTE — Patient Instructions (Addendum)
Your physician has requested that you go to the basement for the following lab work before leaving today: Iron studies.  You have been scheduled for an endoscopy and colonoscopy with propofol. Please follow the written instructions given to you at your visit today. Please pick up your prep at the pharmacy within the next 1-3 days. If you use inhalers (even only as needed), please bring them with you on the day of your procedure. Your physician has requested that you go to www.startemmi.com and enter the access code given to you at your visit today. This web site gives a general overview about your procedure. However, you should still follow specific instructions given to you by our office regarding your preparation for the procedure.  You may have a light breakfast the morning before your procedure. You may choose from: eggs, toast, chicken noodle soup, crackers.  You should have your breakfast completed between 8:00 and 9:00 am.  Clear liquids only for the rest of the day.   Thank you for choosing me and [facility].  Venita Lick. Pleas Koch., MD., [facility]

## 2013-08-06 DIAGNOSIS — M48061 Spinal stenosis, lumbar region without neurogenic claudication: Secondary | ICD-10-CM | POA: Diagnosis not present

## 2013-08-06 DIAGNOSIS — IMO0002 Reserved for concepts with insufficient information to code with codable children: Secondary | ICD-10-CM | POA: Diagnosis not present

## 2013-08-07 DIAGNOSIS — N400 Enlarged prostate without lower urinary tract symptoms: Secondary | ICD-10-CM | POA: Diagnosis not present

## 2013-08-14 DIAGNOSIS — R39198 Other difficulties with micturition: Secondary | ICD-10-CM | POA: Diagnosis not present

## 2013-08-14 DIAGNOSIS — N400 Enlarged prostate without lower urinary tract symptoms: Secondary | ICD-10-CM | POA: Diagnosis not present

## 2013-08-14 DIAGNOSIS — R972 Elevated prostate specific antigen [PSA]: Secondary | ICD-10-CM | POA: Diagnosis not present

## 2013-08-14 DIAGNOSIS — R351 Nocturia: Secondary | ICD-10-CM | POA: Diagnosis not present

## 2013-08-21 DIAGNOSIS — Z23 Encounter for immunization: Secondary | ICD-10-CM | POA: Diagnosis not present

## 2013-09-04 ENCOUNTER — Ambulatory Visit (AMBULATORY_SURGERY_CENTER): Payer: Medicare Other | Admitting: Gastroenterology

## 2013-09-04 ENCOUNTER — Encounter: Payer: Self-pay | Admitting: Gastroenterology

## 2013-09-04 VITALS — BP 124/59 | HR 58 | Temp 98.3°F | Resp 25 | Ht 68.0 in | Wt 175.0 lb

## 2013-09-04 DIAGNOSIS — G4733 Obstructive sleep apnea (adult) (pediatric): Secondary | ICD-10-CM | POA: Diagnosis not present

## 2013-09-04 DIAGNOSIS — D131 Benign neoplasm of stomach: Secondary | ICD-10-CM | POA: Diagnosis not present

## 2013-09-04 DIAGNOSIS — R1310 Dysphagia, unspecified: Secondary | ICD-10-CM | POA: Diagnosis not present

## 2013-09-04 DIAGNOSIS — Q2733 Arteriovenous malformation of digestive system vessel: Secondary | ICD-10-CM | POA: Diagnosis not present

## 2013-09-04 DIAGNOSIS — K2970 Gastritis, unspecified, without bleeding: Secondary | ICD-10-CM

## 2013-09-04 DIAGNOSIS — D649 Anemia, unspecified: Secondary | ICD-10-CM | POA: Diagnosis not present

## 2013-09-04 DIAGNOSIS — K5520 Angiodysplasia of colon without hemorrhage: Secondary | ICD-10-CM

## 2013-09-04 DIAGNOSIS — D509 Iron deficiency anemia, unspecified: Secondary | ICD-10-CM | POA: Diagnosis not present

## 2013-09-04 DIAGNOSIS — K2270 Barrett's esophagus without dysplasia: Secondary | ICD-10-CM

## 2013-09-04 DIAGNOSIS — I10 Essential (primary) hypertension: Secondary | ICD-10-CM | POA: Diagnosis not present

## 2013-09-04 LAB — GLUCOSE, CAPILLARY
Glucose-Capillary: 76 mg/dL (ref 70–99)
Glucose-Capillary: 98 mg/dL (ref 70–99)

## 2013-09-04 MED ORDER — SODIUM CHLORIDE 0.9 % IV SOLN: 500.0000 mL | INTRAVENOUS | Status: DC

## 2013-09-04 NOTE — Patient Instructions (Addendum)

## 2013-09-04 NOTE — Op Note (Signed)
[facility] [address] [place], [address]   ENDOSCOPY PROCEDURE REPORT  PATIENT: Charles [name], Charles [name]  MR#: [id number] BIRTHDATE: Sep 01, 1931 , 79  yrs. old GENDER: Male ENDOSCOPIST: Meryl Dare, MD, [facility] PROCEDURE DATE:  09/04/2013 PROCEDURE:  EGD w/ biopsy ASA CLASS:     Class III INDICATIONS:  follow up of Barrett's esophagus.   Iron deficiency anemia. MEDICATIONS: There was residual sedation effect present from prior procedure, MAC sedation, administered by CRNA, and propofol (Diprivan) 100mg  IV TOPICAL ANESTHETIC: Cetacaine Spray DESCRIPTION OF PROCEDURE: After the risks benefits and alternatives of the procedure were thoroughly explained, informed consent was obtained.  The LB [id number] endoscope was introduced through the mouth and advanced to the second portion of the duodenum   without limitations.  The instrument was slowly withdrawn as the mucosa was fully examined.  ESOPHAGUS: There was evidence of Barrett's esophagus in the lower third of the esophagus. It was 3 cm in length. Multiple biopsies were performed.  The esophagus was tortuous.  The esophagus was otherwise normal. STOMACH: Mild, focal gastritis  was found in the gastric body. Multiple biopsies were performed.  The stomach otherwise appeared normal. DUODENUM: The duodenal mucosa showed no abnormalities in the bulb and second portion of the duodenum.  Retroflexed views revealed a 5 cm hiatal hernia.   The scope was then withdrawn from the patient and the procedure completed.  COMPLICATIONS: There were no complications. ENDOSCOPIC IMPRESSION: 1.   Barrett's esophagus; multiple biopsies 2.   Tortuous esophagus 3.   Moderate sized hiatal hernia 4.   Gastritis in the gastric body; multiple biopsies  RECOMMENDATIONS: 1.  Anti-reflux regimen 2.  Await pathology results 3.  Continue PPI 4.  No plans for future surveillance EGDs for Barretts. These type of surveillance  exams usually stop around age 77.  eSigned:  Meryl Dare, MD, [facility] 09/04/2013 3:50 PM   EX:[id number] Eloise Harman, MD

## 2013-09-04 NOTE — Progress Notes (Signed)
Lidocaine-40mg IV prior to Propofol InductionPropofol given over incremental dosages 

## 2013-09-04 NOTE — Op Note (Signed)
[facility] [address] [place], [address]   COLONOSCOPY PROCEDURE REPORT  PATIENT: Charles, [name]  MR#: [id number] BIRTHDATE: May 17, 1931 , 82  yrs. old GENDER: Male ENDOSCOPIST: Meryl Dare, MD, [facility] PROCEDURE DATE:  09/04/2013 PROCEDURE:   Colonoscopy, diagnostic First Screening Colonoscopy - Avg.  risk and is 50 yrs.  old or older - No.  Prior Negative Screening - Now for repeat screening. N/A  History of Adenoma - Now for follow-up colonoscopy & has been > or = to 3 yrs.  N/A  Polyps Removed Today? No.  Recommend repeat exam, <10 yrs? No. ASA CLASS:   Class III INDICATIONS:Iron Deficiency Anemia. MEDICATIONS: MAC sedation, administered by CRNA and propofol (Diprivan) 100mg  IV DESCRIPTION OF PROCEDURE:   After the risks benefits and alternatives of the procedure were thoroughly explained, informed consent was obtained.  A digital rectal exam revealed no abnormalities of the rectum.   The LB [id number]  endoscope was introduced through the anus and advanced to the cecum, which was identified by both the appendix and ileocecal valve. No adverse events experienced.   The quality of the prep was adequate, using MoviPrep  The instrument was then slowly withdrawn as the colon was fully examined.  COLON FINDINGS: Moderate sized, non bleeding AVM was noted in the ascending colon.  Moderate diverticulosis was noted in the descending colon and sigmoid colon. The colon was otherwise normal. There was no diverticulosis, inflammation, polyps or cancers unless previously stated.  Retroflexed views revealed no abnormalities. The time to cecum=4 minutes 15 seconds.  Withdrawal time=11 minutes 50 seconds.  The scope was withdrawn and the procedure completed. COMPLICATIONS: There were no complications.  ENDOSCOPIC IMPRESSION: 1.   Moderate sized AVM in the ascending colon 2.   Moderate diverticulosis was noted in the descending colon and sigmoid  colon  RECOMMENDATIONS: 1.  High fiber diet with liberal fluid intake. 2.  Iron deficiency likely secondary to losses from AVM 2.  Given your age, you will not need another colonoscopy for colon cancer screening or polyp surveillance.  These types of tests usually stop around the age 4.  eSigned:  Meryl Dare, MD, [facility] 09/04/2013 3:44 PM   cc: Jarome Matin, MD

## 2013-09-04 NOTE — Progress Notes (Signed)
Patient did not experience any of the following events: a burn prior to discharge; a fall within the facility; wrong site/side/patient/procedure/implant event; or a hospital transfer or hospital admission upon discharge from the facility. (G8907) Patient did not have preoperative order for IV antibiotic SSI prophylaxis. (G8918)  

## 2013-09-04 NOTE — Progress Notes (Signed)
Called to room to assist during endoscopic procedure.  Patient ID and intended procedure confirmed with present staff. Received instructions for my participation in the procedure from the performing physician.  

## 2013-09-04 NOTE — Progress Notes (Signed)
Hung D5/W in admitting patient behavior a little sluggish stated borderline Diabetic no medications or insulin.

## 2013-09-05 ENCOUNTER — Telehealth: Payer: Self-pay | Admitting: *Deleted

## 2013-09-05 NOTE — Telephone Encounter (Signed)
  Follow up Call-  Call back number 09/04/2013 09/27/2011  Post procedure Call Back phone  # [phone]  Permission to leave phone message Yes -     Patient questions:  Do you have a fever, pain , or abdominal swelling? no Pain Score  0 *  Have you tolerated food without any problems? yes  Have you been able to return to your normal activities? yes  Do you have any questions about your discharge instructions: Diet   no Medications  no Follow up visit  no  Do you have questions or concerns about your Care? no  Actions: * If pain score is 4 or above: No action needed, pain <4.

## 2013-09-17 ENCOUNTER — Encounter: Payer: Self-pay | Admitting: Gastroenterology

## 2013-10-02 DIAGNOSIS — H26499 Other secondary cataract, unspecified eye: Secondary | ICD-10-CM | POA: Diagnosis not present

## 2013-10-02 DIAGNOSIS — H35379 Puckering of macula, unspecified eye: Secondary | ICD-10-CM | POA: Diagnosis not present

## 2013-11-05 DIAGNOSIS — M47812 Spondylosis without myelopathy or radiculopathy, cervical region: Secondary | ICD-10-CM | POA: Diagnosis not present

## 2013-11-05 DIAGNOSIS — M5030 Other cervical disc degeneration, unspecified cervical region: Secondary | ICD-10-CM | POA: Diagnosis not present

## 2013-11-05 DIAGNOSIS — M19049 Primary osteoarthritis, unspecified hand: Secondary | ICD-10-CM | POA: Diagnosis not present

## 2013-11-07 DIAGNOSIS — D509 Iron deficiency anemia, unspecified: Secondary | ICD-10-CM | POA: Diagnosis not present

## 2013-11-07 DIAGNOSIS — D649 Anemia, unspecified: Secondary | ICD-10-CM | POA: Diagnosis not present

## 2013-11-26 ENCOUNTER — Other Ambulatory Visit: Payer: Self-pay

## 2013-11-26 MED ORDER — RANITIDINE HCL 300 MG PO TABS: 300.0000 mg | ORAL_TABLET | Freq: Every day | ORAL | Status: DC

## 2013-12-01 DIAGNOSIS — M19049 Primary osteoarthritis, unspecified hand: Secondary | ICD-10-CM | POA: Diagnosis not present

## 2013-12-01 DIAGNOSIS — M47812 Spondylosis without myelopathy or radiculopathy, cervical region: Secondary | ICD-10-CM | POA: Diagnosis not present

## 2013-12-15 DIAGNOSIS — L819 Disorder of pigmentation, unspecified: Secondary | ICD-10-CM | POA: Diagnosis not present

## 2013-12-15 DIAGNOSIS — L259 Unspecified contact dermatitis, unspecified cause: Secondary | ICD-10-CM | POA: Diagnosis not present

## 2013-12-15 DIAGNOSIS — L821 Other seborrheic keratosis: Secondary | ICD-10-CM | POA: Diagnosis not present

## 2013-12-15 DIAGNOSIS — D1801 Hemangioma of skin and subcutaneous tissue: Secondary | ICD-10-CM | POA: Diagnosis not present

## 2013-12-29 DIAGNOSIS — D649 Anemia, unspecified: Secondary | ICD-10-CM | POA: Diagnosis not present

## 2013-12-29 DIAGNOSIS — M810 Age-related osteoporosis without current pathological fracture: Secondary | ICD-10-CM | POA: Diagnosis not present

## 2013-12-29 DIAGNOSIS — G2581 Restless legs syndrome: Secondary | ICD-10-CM | POA: Diagnosis not present

## 2013-12-29 DIAGNOSIS — Z6826 Body mass index (BMI) 26.0-26.9, adult: Secondary | ICD-10-CM | POA: Diagnosis not present

## 2013-12-29 DIAGNOSIS — Z23 Encounter for immunization: Secondary | ICD-10-CM | POA: Diagnosis not present

## 2013-12-29 DIAGNOSIS — G4733 Obstructive sleep apnea (adult) (pediatric): Secondary | ICD-10-CM | POA: Diagnosis not present

## 2014-01-02 ENCOUNTER — Other Ambulatory Visit: Payer: Self-pay

## 2014-01-02 MED ORDER — OMEPRAZOLE 40 MG PO CPDR: 40.0000 mg | DELAYED_RELEASE_CAPSULE | Freq: Two times a day (BID) | ORAL | Status: DC

## 2014-02-03 DIAGNOSIS — E559 Vitamin D deficiency, unspecified: Secondary | ICD-10-CM | POA: Diagnosis not present

## 2014-02-03 DIAGNOSIS — I2510 Atherosclerotic heart disease of native coronary artery without angina pectoris: Secondary | ICD-10-CM | POA: Diagnosis not present

## 2014-02-03 DIAGNOSIS — G2581 Restless legs syndrome: Secondary | ICD-10-CM | POA: Diagnosis not present

## 2014-02-03 DIAGNOSIS — Z6826 Body mass index (BMI) 26.0-26.9, adult: Secondary | ICD-10-CM | POA: Diagnosis not present

## 2014-02-03 DIAGNOSIS — M810 Age-related osteoporosis without current pathological fracture: Secondary | ICD-10-CM | POA: Diagnosis not present

## 2014-02-03 DIAGNOSIS — G4733 Obstructive sleep apnea (adult) (pediatric): Secondary | ICD-10-CM | POA: Diagnosis not present

## 2014-02-03 DIAGNOSIS — D649 Anemia, unspecified: Secondary | ICD-10-CM | POA: Diagnosis not present

## 2014-02-05 DIAGNOSIS — R972 Elevated prostate specific antigen [PSA]: Secondary | ICD-10-CM | POA: Diagnosis not present

## 2014-02-05 DIAGNOSIS — N400 Enlarged prostate without lower urinary tract symptoms: Secondary | ICD-10-CM | POA: Diagnosis not present

## 2014-02-05 DIAGNOSIS — R351 Nocturia: Secondary | ICD-10-CM | POA: Diagnosis not present

## 2014-02-05 DIAGNOSIS — R39198 Other difficulties with micturition: Secondary | ICD-10-CM | POA: Diagnosis not present

## 2014-02-12 DIAGNOSIS — N400 Enlarged prostate without lower urinary tract symptoms: Secondary | ICD-10-CM | POA: Diagnosis not present

## 2014-02-12 DIAGNOSIS — R350 Frequency of micturition: Secondary | ICD-10-CM | POA: Diagnosis not present

## 2014-02-12 DIAGNOSIS — R39198 Other difficulties with micturition: Secondary | ICD-10-CM | POA: Diagnosis not present

## 2014-02-12 DIAGNOSIS — R972 Elevated prostate specific antigen [PSA]: Secondary | ICD-10-CM | POA: Diagnosis not present

## 2014-02-16 DIAGNOSIS — L918 Other hypertrophic disorders of the skin: Secondary | ICD-10-CM | POA: Diagnosis not present

## 2014-02-16 DIAGNOSIS — H02419 Mechanical ptosis of unspecified eyelid: Secondary | ICD-10-CM | POA: Diagnosis not present

## 2014-02-16 DIAGNOSIS — H02139 Senile ectropion of unspecified eye, unspecified eyelid: Secondary | ICD-10-CM | POA: Diagnosis not present

## 2014-02-16 DIAGNOSIS — H11439 Conjunctival hyperemia, unspecified eye: Secondary | ICD-10-CM | POA: Diagnosis not present

## 2014-02-16 DIAGNOSIS — L908 Other atrophic disorders of skin: Secondary | ICD-10-CM | POA: Diagnosis not present

## 2014-02-25 ENCOUNTER — Other Ambulatory Visit (HOSPITAL_COMMUNITY): Payer: Self-pay | Admitting: Internal Medicine

## 2014-03-02 ENCOUNTER — Encounter (HOSPITAL_COMMUNITY): Payer: Self-pay

## 2014-03-02 ENCOUNTER — Ambulatory Visit (HOSPITAL_COMMUNITY)
Admission: RE | Admit: 2014-03-02 | Discharge: 2014-03-02 | Disposition: A | Discharge status: Home / Self Care | Payer: Medicare Other | Source: Ambulatory Visit | Attending: Internal Medicine | Admitting: Internal Medicine

## 2014-03-02 DIAGNOSIS — M810 Age-related osteoporosis without current pathological fracture: Secondary | ICD-10-CM | POA: Diagnosis not present

## 2014-03-02 MED ORDER — ZOLEDRONIC ACID 5 MG/100ML IV SOLN
5.0000 mg | Freq: Once | INTRAVENOUS | Status: AC
  Administered 2014-03-02: 5 mg via INTRAVENOUS
  Filled 2014-03-02: qty 100

## 2014-03-02 MED ORDER — SODIUM CHLORIDE 0.9 % IV SOLN
INTRAVENOUS | Status: AC
  Administered 2014-03-02: 12:00:00 via INTRAVENOUS

## 2014-03-02 NOTE — Discharge Instructions (Signed)

## 2014-04-13 DIAGNOSIS — E785 Hyperlipidemia, unspecified: Secondary | ICD-10-CM | POA: Diagnosis not present

## 2014-04-13 DIAGNOSIS — R0789 Other chest pain: Secondary | ICD-10-CM | POA: Diagnosis not present

## 2014-04-13 DIAGNOSIS — G4733 Obstructive sleep apnea (adult) (pediatric): Secondary | ICD-10-CM | POA: Diagnosis not present

## 2014-04-13 DIAGNOSIS — Z6826 Body mass index (BMI) 26.0-26.9, adult: Secondary | ICD-10-CM | POA: Diagnosis not present

## 2014-04-13 DIAGNOSIS — N62 Hypertrophy of breast: Secondary | ICD-10-CM | POA: Diagnosis not present

## 2014-04-13 DIAGNOSIS — I2510 Atherosclerotic heart disease of native coronary artery without angina pectoris: Secondary | ICD-10-CM | POA: Diagnosis not present

## 2014-04-13 DIAGNOSIS — H6120 Impacted cerumen, unspecified ear: Secondary | ICD-10-CM | POA: Diagnosis not present

## 2014-04-15 ENCOUNTER — Encounter: Payer: Self-pay | Admitting: Neurology

## 2014-04-23 ENCOUNTER — Encounter: Payer: Self-pay | Admitting: *Deleted

## 2014-04-23 ENCOUNTER — Encounter: Payer: Self-pay | Admitting: Neurology

## 2014-04-24 ENCOUNTER — Ambulatory Visit (INDEPENDENT_AMBULATORY_CARE_PROVIDER_SITE_OTHER): Payer: Medicare Other | Admitting: Neurology

## 2014-04-24 ENCOUNTER — Encounter: Payer: Self-pay | Admitting: Neurology

## 2014-04-24 ENCOUNTER — Encounter (INDEPENDENT_AMBULATORY_CARE_PROVIDER_SITE_OTHER): Payer: Self-pay

## 2014-04-24 VITALS — BP 138/80 | HR 67 | Resp 16 | Ht 68.5 in | Wt 177.0 lb

## 2014-04-24 DIAGNOSIS — G250 Essential tremor: Secondary | ICD-10-CM | POA: Diagnosis not present

## 2014-04-24 DIAGNOSIS — R251 Tremor, unspecified: Secondary | ICD-10-CM | POA: Insufficient documentation

## 2014-04-24 DIAGNOSIS — G4710 Hypersomnia, unspecified: Secondary | ICD-10-CM

## 2014-04-24 DIAGNOSIS — G4733 Obstructive sleep apnea (adult) (pediatric): Secondary | ICD-10-CM | POA: Diagnosis not present

## 2014-04-24 DIAGNOSIS — G4730 Sleep apnea, unspecified: Secondary | ICD-10-CM | POA: Diagnosis not present

## 2014-04-24 DIAGNOSIS — G252 Other specified forms of tremor: Secondary | ICD-10-CM

## 2014-04-24 DIAGNOSIS — G4752 REM sleep behavior disorder: Secondary | ICD-10-CM | POA: Diagnosis not present

## 2014-04-24 DIAGNOSIS — Z9989 Dependence on other enabling machines and devices: Principal | ICD-10-CM

## 2014-04-24 NOTE — Progress Notes (Signed)
[facility]  Provider:  Larey Seat, M D  Referring Provider: Leanna Battles, MD Primary Care Physician:  Donnajean Lopes, MD  Chief Complaint  Patient presents with  . New Evaluation    Room 10  . Sleep consult    HPI:  Charles [name] is a 80 y.o. , right handed , caucasian , retired Arboriculturist . He is  seen here as a referral  from Dr. Philip Aspen for a sleep apnea Re- evaluation;  Charles [name] colts being tested for sleep apnea about 10 or 11 years ago. It was his primary care physician Dr. Sharlett Iles who referred him for the sleep test at the time to [facility]. He received the diagnosis of a the sleep apnea and was prescribed a CPAP machine. Currently his durable medical equipment company is apnea. He brought his machine here but unfortunately I was soft for does not allow to read the Apri memory chip. He received a new machine just earlier this year, March 19th. 2015  Dr. Philip Aspen was the prescribing physician.  He feels that his machine works fine for him but is unaware of the current settings. He wears a  Lexicographer Fullface mask. He just got that new , too.  He was not required to undergo a new sleep test to qualify ? Medicare approval usually depends on a re evaluation.    [name] Reports a bedtime between 10.30 to 11 PM and falls asleep promptly. He has no Tv in the bedroom and watches in the den before going to bed. He sleeps often while the TV is runnig, his wife reports. His father was the same well. He will have 2 nocturia  breaks, since using CPAP 12 years ago. He has been using hydroxyzine as a sleep aid every night, 50 mg.  He wakes up feeling refreshed, after 7 hours of sleep. He rises at 6.30, uses an alarm . He drinks no caffeine.  No ETOH and no tobacco use reported.  He is falling asleep here reporting his medical history.... It appears , that Charles [name] has a resting tremor and los of facial mimic as well as  hypophonia.  His wife reports that he has been yelling and thrashing in his sleep, but falls back asleep within minutes, doesn't leave the bed.    Review of Systems: Out of a complete 14 system review, the patient complains of only the following symptoms, and all other reviewed systems are negative. On CPAP, reportedly compliant, but I cannot interogate the machine.  Humidity setting is 6, RAMP time off.   History   Social History  . Marital Status: Married    Spouse Name: Charles [name]    Number of Children: 3  . Years of Education: College   Occupational History  . Retired     retired Arboriculturist   Social History Main Topics  . Smoking status: Never Smoker   . Smokeless tobacco: Never Used  . Alcohol Use: No  . Drug Use: No  . Sexual Activity: Not on file   Other Topics Concern  . Not on file   Social History Narrative   Patient is married Charles [name]).   Patient is a retired Arboriculturist.   Patient lives in [facility].   Patient has three adult children.   Patient does not drink any caffeine.   Patient is right-handed.    Family History  Problem Relation Age of Onset  . Prostate cancer Father   .  CAD Mother     Past Medical History  Diagnosis Date  . GERD (gastroesophageal reflux disease)   . Cataract   . Depression   . Hyperlipidemia   . Osteoporosis   . Barrett's esophagus   . Lymphocytosis 06/2012  . Diverticulosis   . Vitamin D deficiency   . Diverticulosis   . BPH (benign prostatic hyperplasia)   . Murmur   . Constipation   . Arthritis   . Urinary retention with incomplete bladder emptying     pt uses bathroom and within 30 mins needs to go again  . Sleep apnea     wears CPAP  . CAD (coronary artery disease)     only on stress test; never had MI, PCI or CABG.  In 2008, LVEF was reportedly normal.     Past Surgical History  Procedure Laterality Date  . Upper gastrointestinal endoscopy    . Repair spigelian hernia  2011  . Inguinal hernia  repair      x4  . Back surgury      f  . Spinal fusion  1996  . Finger surgery Right     right thumb  . Colonoscopy    . Eye surgery Bilateral   . Back surgery  04/2013  . Cataract extraction      Current Outpatient Prescriptions  Medication Sig Dispense Refill  . aspirin EC 81 MG tablet Take 81 mg by mouth daily.      . benzonatate (TESSALON) 100 MG capsule Take 100 mg by mouth 3 (three) times daily as needed for cough.      Marland Kitchen buPROPion (WELLBUTRIN SR) 150 MG 12 hr tablet Take 150 mg by mouth 2 (two) times daily.       . carbamide peroxide (DEBROX) 6.5 % otic solution Place 4 drops into both ears daily.      . cholecalciferol (VITAMIN D) 1000 UNITS tablet Take 1,000 Units by mouth daily.      . cyclobenzaprine (FLEXERIL) 5 MG tablet Take 5 mg by mouth 3 (three) times daily as needed for muscle spasms.      . ferrous sulfate ([employer] IRON) 325 (65 FE) MG tablet Take 325 mg by mouth daily.      . finasteride (PROSCAR) 5 MG tablet Take 5 mg by mouth daily.       . fish oil-omega-3 fatty acids 1000 MG capsule Take 1 g by mouth daily.       Marland Kitchen HYDROcodone-acetaminophen (NORCO) 5-325 MG per tablet Take 1-2 tablets by mouth every 4 (four) hours as needed for pain.  30 tablet  0  . hydrOXYzine (ATARAX/VISTARIL) 50 MG tablet Take 50 mg by mouth at bedtime as needed (sleep).       . lovastatin (MEVACOR) 20 MG tablet Take 1 tablet by mouth at bedtime.      . Multiple Minerals-Vitamins (CITRACAL PLUS BONE DENSITY) TABS Take 2 tablets by mouth daily.      . Multiple Vitamins-Minerals (CENTRUM SILVER ADULT 50+) TABS Take 1 tablet by mouth daily with breakfast.      . nabumetone (RELAFEN) 500 MG tablet Take 500 mg by mouth 2 (two) times daily.      Marland Kitchen omeprazole (PRILOSEC) 40 MG capsule Take 1 capsule (40 mg total) by mouth 2 (two) times daily.  60 capsule  6  . propranolol (INDERAL) 10 MG tablet Take 10 mg by mouth daily as needed.      . ranitidine (ZANTAC) 300 MG tablet Take 1  tablet (300 mg total) by  mouth at bedtime.  90 tablet  1  . rOPINIRole (REQUIP) 1 MG tablet Take 1 mg by mouth at bedtime.      . senna (SENOKOT) 8.6 MG tablet Take 1 tablet by mouth daily.      . Tamsulosin HCl (FLOMAX) 0.4 MG CAPS Take 0.4 mg by mouth daily.       . traMADol (ULTRAM) 50 MG tablet Take 100 mg by mouth 2 (two) times daily as needed for pain.       Marland Kitchen zoledronic acid (RECLAST) 5 MG/100ML SOLN injection Inject 5 mg into the vein once. Start date 01/22/2013       No current facility-administered medications for this visit.    Allergies as of 04/24/2014 - Review Complete 04/24/2014  Allergen Reaction Noted  . Celebrex [celecoxib] Other (See Comments) 04/23/2012    Vitals: BP 138/80  Pulse 67  Resp 16  Ht 5' 8.5" (1.74 m)  Wt 177 lb (80.287 kg)  BMI 26.52 kg/m2 Last Weight:  Wt Readings from Last 1 Encounters:  04/24/14 177 lb (80.287 kg)   Last Height:   Ht Readings from Last 1 Encounters:  04/24/14 5' 8.5" (1.74 m)    Physical exam:  General: The patient is awake, alert and appears not in acute distress. The patient is well groomed. Head: Normocephalic, atraumatic. Neck is supple. Mallampati 2, neck circumference: 16 inches. He has a tongue tremor,  A droopy left mouth. Reduced facial expression.  Cardiovascular:  Regular rate and rhythm , without carotid bruit, and without distended neck veins. Mitral murmur?  Respiratory: Lungs are clear to auscultation. Skin:  Without evidence of edema, or rash Trunk: BMI is normal, as is  posture.  Neurologic exam : The patient is fatigued, drowsy. oriented to place and time.  Memory subjective  described as delayed. There is a normal attention span & concentration ability.  Speech is fluent without  dysarthria, dysphonia or aphasia. Mood and affect are appropriate.  Cranial nerves: Pupils are equal and briskly reactive to light. Funduscopic exam without  evidence of pallor or edema. Status post cataract surgery - dry eyes.  Extraocular movements   in vertical and horizontal planes intact and without nystagmus. Visual fields by finger perimetry are intact. Hearing to finger rub intact.  Facial sensation intact to fine touch. Facial motor strength - left sided droop,  tongue and uvula move midline.  Motor exam:  Cog wheeling over either biceps. normal muscle bulk and symmetric normal strength in all extremities. Very d strong grip.   Sensory:  Fine touch, pinprick and vibration were tested in all extremities. Proprioception is  normal.  Coordination: Rapid alternating movements in the fingers/hands is tested and normal. Finger-to-nose maneuver tested and  with dysmetria and  Tremor- more on the right , dominant side. Patient reports tremor impairs his hand writing.   Gait and station: Patient walks without assistive device  Strength within normal limits. Stance is stable and normal.  Steps are unfragmented.  Deep tendon reflexes:  Status post lumbar fusion , times 2 , several levels  in the  upper  extremities are symmetric and intact.  Patella absent. Babinski maneuver response is  downgoing.   Assessment:  After physical and neurologic examination, review of laboratory studies, imaging, neurophysiology testing and pre-existing records, assessment is   1) OSA assumed, I have no data to work with- he is still sleepy- Epworth of 10 and FSS of 23 points.  I would  like for him to have the brand new air sense machine used as an auto-titrator , to find the best setting. 6-12 cm water pressure window- APRIA?  2) I suspect REM BD and early Parkinson's disease in this pleasant gentleman. He has RLS.  3) depression not likely contributor to fatigue , sleepiness, GDS was 3.    Plan:  Treatment plan and additional workup : 1)Tramadol prescribed for back pain and helps with RLS, may continue.  2) propanolol- he is not taking it- he should  To see if it actually affects his tremor at all, otherwise movement disorder work up next visit.  MOCA next  visit.

## 2014-04-24 NOTE — Patient Instructions (Signed)
Sleep Apnea   Sleep apnea is a sleep disorder characterized by abnormal pauses in breathing while you sleep. When your breathing pauses, the level of oxygen in your blood decreases. This causes you to move out of deep sleep and into light sleep. As a result, your quality of sleep is poor, and the system that carries your blood throughout your body (cardiovascular system) experiences stress. If sleep apnea remains untreated, the following conditions can develop:  · High blood pressure (hypertension).  · Coronary artery disease.  · Inability to achieve or maintain an erection (impotence).  · Impairment of your thought process (cognitive dysfunction).  There are three types of sleep apnea:  1. Obstructive sleep apnea Pauses in breathing during sleep because of a blocked airway.  2. Central sleep apnea Pauses in breathing during sleep because the area of the brain that controls your breathing does not send the correct signals to the muscles that control breathing.  3. Mixed sleep apnea A combination of both obstructive and central sleep apnea.  RISK FACTORS  The following risk factors can increase your risk of developing sleep apnea:  · Being overweight.  · Smoking.  · Having narrow passages in your nose and throat.  · Being of older age.  · Being male.  · Alcohol use.  · Sedative and tranquilizer use.  · Ethnicity. Among individuals younger than 35 years, African Americans are at increased risk of sleep apnea.  SYMPTOMS   · Difficulty staying asleep.  · Daytime sleepiness and fatigue.  · Loss of energy.  · Irritability.  · Loud, heavy snoring.  · Morning headaches.  · Trouble concentrating.  · Forgetfulness.  · Decreased interest in sex.  DIAGNOSIS   In order to diagnose sleep apnea, your caregiver will perform a physical examination. Your caregiver may suggest that you take a home sleep test. Your caregiver may also recommend that you spend the night in a sleep lab. In the sleep lab, several monitors record  information about your heart, lungs, and brain while you sleep. Your leg and arm movements and blood oxygen level are also recorded.  TREATMENT  The following actions may help to resolve mild sleep apnea:  · Sleeping on your side.    · Using a decongestant if you have nasal congestion.    · Avoiding the use of depressants, including alcohol, sedatives, and narcotics.    · Losing weight and modifying your diet if you are overweight.  There also are devices and treatments to help open your airway:  · Oral appliances. These are custom-made mouthpieces that shift your lower jaw forward and slightly open your bite. This opens your airway.  · Devices that create positive airway pressure. This positive pressure "splints" your airway open to help you breathe better during sleep. The following devices create positive airway pressure:  · Continuous positive airway pressure (CPAP) device. The CPAP device creates a continuous level of air pressure with an air pump. The air is delivered to your airway through a mask while you sleep. This continuous pressure keeps your airway open.  · Nasal expiratory positive airway pressure (EPAP) device. The EPAP device creates positive air pressure as you exhale. The device consists of single-use valves, which are inserted into each nostril and held in place by adhesive. The valves create very little resistance when you inhale but create much more resistance when you exhale. That increased resistance creates the positive airway pressure. This positive pressure while you exhale keeps your airway open, making it easier   continuous air pressure through a mask. However, with the BPAP machine, the pressure is set at two different levels. The pressure when you  exhale is lower than the pressure when you inhale.  Surgery. Typically, surgery is only done if you cannot comply with less invasive treatments or if the less invasive treatments do not improve your condition. Surgery involves removing excess tissue in your airway to create a wider passage way. Document Released: 11/10/2002 Document Revised: 03/17/2013 Document Reviewed: 03/28/2012 [facility] Patient Information 2014 [place]. Tremor Tremor is a rhythmic, involuntary muscular contraction characterized by oscillations (to-and-fro movements) of a part of the body. The most common of all involuntary movements, tremor can affect various body parts such as the hands, head, facial structures, vocal cords, trunk, and legs; most tremors, however, occur in the hands. Tremor often accompanies neurological disorders associated with aging. Although the disorder is not life-threatening, it can be responsible for functional disability and social embarrassment. TREATMENT  There are many types of tremor and several ways in which tremor is classified. The most common classification is by behavioral context or position. There are five categories of tremor within this classification: resting, postural, kinetic, task-specific, and psychogenic. Resting or static tremor occurs when the muscle is at rest, for example when the hands are lying on the lap. This type of tremor is often seen in patients with Parkinson's disease. Postural tremor occurs when a patient attempts to maintain posture, such as holding the hands outstretched. Postural tremors include physiological tremor, essential tremor, tremor with basal ganglia disease (also seen in patients with Parkinson's disease), cerebellar postural tremor, tremor with peripheral neuropathy, post-traumatic tremor, and alcoholic tremor. Kinetic or intention (action) tremor occurs during purposeful movement, for example during finger-to-nose testing. Task-specific tremor appears  when performing goal-oriented tasks such as handwriting, speaking, or standing. This group consists of primary writing tremor, vocal tremor, and orthostatic tremor. Psychogenic tremor occurs in both older and younger patients. The key feature of this tremor is that it dramatically lessens or disappears when the patient is distracted. PROGNOSIS There are some treatment options available for tremor; the appropriate treatment depends on accurate diagnosis of the cause. Some tremors respond to treatment of the underlying condition, for example in some cases of psychogenic tremor treating the patient's underlying mental problem may cause the tremor to disappear. Also, patients with tremor due to Parkinson's disease may be treated with Levodopa drug therapy. Symptomatic drug therapy is available for several other tremors as well. For those cases of tremor in which there is no effective drug treatment, physical measures such as teaching the patient to brace the affected limb during the tremor are sometimes useful. Surgical intervention such as thalamotomy or deep brain stimulation may be useful in certain cases. Document Released: 11/10/2002 Document Revised: 02/12/2012 Document Reviewed: 11/20/2005 [facility] Patient Information 2014 [place].

## 2014-05-06 DIAGNOSIS — M47812 Spondylosis without myelopathy or radiculopathy, cervical region: Secondary | ICD-10-CM | POA: Diagnosis not present

## 2014-05-06 DIAGNOSIS — M19049 Primary osteoarthritis, unspecified hand: Secondary | ICD-10-CM | POA: Diagnosis not present

## 2014-05-12 DIAGNOSIS — R39198 Other difficulties with micturition: Secondary | ICD-10-CM | POA: Diagnosis not present

## 2014-05-12 DIAGNOSIS — R350 Frequency of micturition: Secondary | ICD-10-CM | POA: Diagnosis not present

## 2014-05-12 DIAGNOSIS — N400 Enlarged prostate without lower urinary tract symptoms: Secondary | ICD-10-CM | POA: Diagnosis not present

## 2014-05-12 DIAGNOSIS — R972 Elevated prostate specific antigen [PSA]: Secondary | ICD-10-CM | POA: Diagnosis not present

## 2014-05-18 DIAGNOSIS — M6281 Muscle weakness (generalized): Secondary | ICD-10-CM | POA: Diagnosis not present

## 2014-05-18 DIAGNOSIS — R262 Difficulty in walking, not elsewhere classified: Secondary | ICD-10-CM | POA: Diagnosis not present

## 2014-05-19 DIAGNOSIS — R350 Frequency of micturition: Secondary | ICD-10-CM | POA: Diagnosis not present

## 2014-05-19 DIAGNOSIS — N401 Enlarged prostate with lower urinary tract symptoms: Secondary | ICD-10-CM | POA: Diagnosis not present

## 2014-05-19 DIAGNOSIS — R972 Elevated prostate specific antigen [PSA]: Secondary | ICD-10-CM | POA: Diagnosis not present

## 2014-05-19 DIAGNOSIS — R351 Nocturia: Secondary | ICD-10-CM | POA: Diagnosis not present

## 2014-05-20 DIAGNOSIS — R262 Difficulty in walking, not elsewhere classified: Secondary | ICD-10-CM | POA: Diagnosis not present

## 2014-05-20 DIAGNOSIS — M6281 Muscle weakness (generalized): Secondary | ICD-10-CM | POA: Diagnosis not present

## 2014-05-21 DIAGNOSIS — M6281 Muscle weakness (generalized): Secondary | ICD-10-CM | POA: Diagnosis not present

## 2014-05-21 DIAGNOSIS — R262 Difficulty in walking, not elsewhere classified: Secondary | ICD-10-CM | POA: Diagnosis not present

## 2014-05-25 ENCOUNTER — Other Ambulatory Visit: Payer: Self-pay | Admitting: Gastroenterology

## 2014-05-25 NOTE — Telephone Encounter (Signed)
NEEDS OFFICE VISIT FOR ANY FURTHER REFILLS! 

## 2014-05-27 DIAGNOSIS — R262 Difficulty in walking, not elsewhere classified: Secondary | ICD-10-CM | POA: Diagnosis not present

## 2014-05-27 DIAGNOSIS — M6281 Muscle weakness (generalized): Secondary | ICD-10-CM | POA: Diagnosis not present

## 2014-05-28 ENCOUNTER — Encounter (INDEPENDENT_AMBULATORY_CARE_PROVIDER_SITE_OTHER): Payer: Self-pay

## 2014-05-28 ENCOUNTER — Ambulatory Visit (INDEPENDENT_AMBULATORY_CARE_PROVIDER_SITE_OTHER): Payer: Medicare Other | Admitting: Nurse Practitioner

## 2014-05-28 ENCOUNTER — Encounter: Payer: Self-pay | Admitting: Nurse Practitioner

## 2014-05-28 VITALS — BP 121/80 | HR 53 | Wt 180.0 lb

## 2014-05-28 DIAGNOSIS — G4710 Hypersomnia, unspecified: Secondary | ICD-10-CM

## 2014-05-28 DIAGNOSIS — R259 Unspecified abnormal involuntary movements: Secondary | ICD-10-CM | POA: Diagnosis not present

## 2014-05-28 DIAGNOSIS — Z9989 Dependence on other enabling machines and devices: Principal | ICD-10-CM

## 2014-05-28 DIAGNOSIS — R251 Tremor, unspecified: Secondary | ICD-10-CM

## 2014-05-28 DIAGNOSIS — G4730 Sleep apnea, unspecified: Secondary | ICD-10-CM

## 2014-05-28 DIAGNOSIS — G4733 Obstructive sleep apnea (adult) (pediatric): Secondary | ICD-10-CM

## 2014-05-28 DIAGNOSIS — G4752 REM sleep behavior disorder: Secondary | ICD-10-CM

## 2014-05-28 NOTE — Patient Instructions (Signed)
Continue CPAP each night for Obstructive Sleep Apnea.  If there is any more information that we need to get from [place], I will have dr. Edwena Felty assistant get in touch with them.  If you do not hear anything from our office, please assume the Medicare requirement was taken care of.  If your tremor starts to worsen and you would like to try different treatment for this, please let us know.  Follow up in 6 months with Dr. Brett Fairy or sooner as needed.

## 2014-05-28 NOTE — Progress Notes (Signed)
**Note De-Identified  Obfuscation** PATIENT: Charles [name] DOB: 1930-12-07  REASON FOR VISIT: routine follow up for OSA on CPAP HISTORY FROM: patient  HISTORY OF PRESENT ILLNESS: Update 05/28/14 (LL):  Charles [name] returns for 4 week follow up, he is not sure why he is been asked to come back.  He is accompanied by his wife. They state that there was some confusion with Medicare and reimbursement for his cpap machine, and Dr. Philip Aspen had referred him to [facility].  He did not bring his cpap machine today, as we were not able to read the memory card at last visit.  They believe the issue has been taken care of, because wife states that the Medicare EOB has paid for the machine.  The only correspondence he has had with [facility] was a recorded phone message asking if he needed any supplies to call their office. He has no complaint today.  He states he always uses his cpap and new new machine works great.  He gets sleepy during the day but attributes this to all the medication that he is taking.  04/24/14 PRIOR HPI (CD): Charles [name] is a 80 y.o. right handed, caucasian, retired Arboriculturist. He is seen here as a referral from Dr. Sharlett Iles for a sleep apnea Re-evaluation;  Mr. Olazabal recalls being tested for sleep apnea about 10 or 11 years ago. It was his primary care physician Dr. Philip Aspen who referred him for the sleep test at the time to [facility]. He received the diagnosis of obstrictive sleep apnea and was prescribed a CPAP machine. Currently his durable medical equipment company is [employer]. He brought his machine here but unfortunately software does not allow to read the [employer] memory chip. He received a new machine just earlier this year, March 19th, 2015 Dr. Philip Aspen was the prescribing physician. He feels that his machine works fine for him but is unaware of the current settings. He wears a Lexicographer Full-face mask. He just got that new too.  He was not required to undergo a new sleep test to  qualify?  Medicare approval usually depends on a re-evaluation.  [name] Reports a bedtime between 10.30 to 11 PM and falls asleep promptly. He has no Tv in the bedroom and watches in the den before going to bed. He sleeps often while the TV is running, his wife reports. His father was the same way. He will have 2 nocturia breaks, since using CPAP 12 years ago. He has been using hydroxyzine as a sleep aid every night, 50 mg.  He wakes up feeling refreshed, after 7 hours of sleep. He rises at 6.30, uses an alarm. He drinks no caffeine. No ETOH and no tobacco use reported.   He is falling asleep here reporting his medical history....  It appears that Mr. Recinos has a resting tremor and loss of facial mimic as well as hypophonia.  His wife reports that he has been yelling and thrashing in his sleep, but falls back asleep within minutes, doesn't leave the bed.   Review of Systems:  Out of a complete 14 system review, the patient complains of only the following symptoms, and all other reviewed systems are negative. Hearing loss, cough, heart murmur, RLS, apnea, back pain, neck stiffness, neck pain, bruise easily, tremor, depression. On CPAP, reportedly compliant, but I cannot interrogate the machine. Humidity setting is 6, RAMP time off.   ALLERGIES: Allergies  Allergen Reactions  . Celebrex [Celecoxib] Other (See Comments) **Note De-Identified  Obfuscation** Created stomach ulcers.    HOME MEDICATIONS: Outpatient Prescriptions Prior to Visit  Medication Sig Dispense Refill  . aspirin EC 81 MG tablet Take 81 mg by mouth daily.      . benzonatate (TESSALON) 100 MG capsule Take 100 mg by mouth 3 (three) times daily as needed for cough.      Marland Kitchen buPROPion (WELLBUTRIN SR) 150 MG 12 hr tablet Take 150 mg by mouth 2 (two) times daily.       . carbamide peroxide (DEBROX) 6.5 % otic solution Place 4 drops into both ears daily.      . cholecalciferol (VITAMIN D) 1000 UNITS tablet Take 1,000 Units by mouth daily.      . cyclobenzaprine  (FLEXERIL) 5 MG tablet Take 5 mg by mouth 3 (three) times daily as needed for muscle spasms.      . ferrous sulfate (CVS IRON) 325 (65 FE) MG tablet Take 325 mg by mouth daily.      . fish oil-omega-3 fatty acids 1000 MG capsule Take 1 g by mouth daily.       Marland Kitchen HYDROcodone-acetaminophen (NORCO) 5-325 MG per tablet Take 1-2 tablets by mouth every 4 (four) hours as needed for pain.  30 tablet  0  . hydrOXYzine (ATARAX/VISTARIL) 50 MG tablet Take 50 mg by mouth at bedtime as needed (sleep).       . lovastatin (MEVACOR) 20 MG tablet Take 1 tablet by mouth at bedtime.      . Multiple Minerals-Vitamins (CITRACAL PLUS BONE DENSITY) TABS Take 2 tablets by mouth daily.      . Multiple Vitamins-Minerals (CENTRUM SILVER ADULT 50+) TABS Take 1 tablet by mouth daily with breakfast.      . nabumetone (RELAFEN) 500 MG tablet Take 500 mg by mouth 2 (two) times daily.      Marland Kitchen omeprazole (PRILOSEC) 40 MG capsule Take 1 capsule (40 mg total) by mouth 2 (two) times daily.  60 capsule  6  . propranolol (INDERAL) 10 MG tablet Take 10 mg by mouth daily as needed.      . ranitidine (ZANTAC) 300 MG tablet TAKE ONE TABLET BY MOUTH AT BEDTIME   90 tablet  0  . rOPINIRole (REQUIP) 1 MG tablet Take 1 mg by mouth at bedtime.      . senna (SENOKOT) 8.6 MG tablet Take 1 tablet by mouth daily.      . Tamsulosin HCl (FLOMAX) 0.4 MG CAPS Take 0.4 mg by mouth daily.       . traMADol (ULTRAM) 50 MG tablet Take 100 mg by mouth 2 (two) times daily as needed for pain.       Marland Kitchen zoledronic acid (RECLAST) 5 MG/100ML SOLN injection Inject 5 mg into the vein once. Start date 01/22/2013      . finasteride (PROSCAR) 5 MG tablet Take 5 mg by mouth daily.        No facility-administered medications prior to visit.    PHYSICAL EXAM Filed Vitals:   05/28/14 1344  BP: 121/80  Pulse: 53  Weight: 180 lb (81.647 kg)   Body mass index is 26.97 kg/(m^2). No exam data present  Generalized: Well developed, in no acute distress  Head:  Normocephalic, atraumatic. Neck is supple. Mallampati 2, neck circumference: 16 inches. He has a tongue tremor, A droopy left mouth. Reduced facial expression.  Neck: Supple, no carotid bruits  Cardiac: Regular rate rhythm, soft systolic murmur, 2/6 Musculoskeletal: No deformity   Neurological examination  Mentation: Alert oriented **Note De-Identified  Obfuscation** to time, place, history taking. Follows all commands speech and language fluent.  MOCA 24/30 with deficits in delayed recall, 1/5, attention, and language fluency. Cranial nerve II-XII: Pupils are equal and briskly reactive to light. Funduscopic exam without evidence of pallor or edema. Status post cataract surgery - dry eyes. Extraocular movements in vertical and horizontal planes intact and without nystagmus. Visual fields by finger perimetry are intact.  Hearing to finger rub intact. Facial sensation intact to fine touch. Facial motor strength - left sided droop, tongue and uvula move midline.  Motor exam: Cog wheeling over either biceps. normal muscle bulk and symmetric normal strength in all extremities. Very strong grip.  Sensory: Fine touch, pinprick and vibration were tested in all extremities. Proprioception is normal.  Coordination: Rapid alternating movements in the fingers/hands is tested and normal. Finger-to-nose maneuver tested and with dysmetria and Tremor- more on the right, dominant side. Patient reports tremor impairs his hand writing.  Gait and station: Patient walks without assistive device Strength within normal limits. Stance is stable and normal. Steps are unfragmented.  Deep tendon reflexes: Status post lumbar fusion, times 2 , several levels in the upper extremities are symmetric and intact. Patella absent. Babinski maneuver response is downgoing.   ASSESSMENT: 80 y.o. year old male with OSA assumed, I have no data to work with- he is still sleepy- Epworth of 11 and FSS of 26 points.  I would like for him to use the air sense machine as an  auto-titrator, to find the best setting. 6-12 cm water pressure window- [facility]?  I suspect REM BD and early Parkinson's disease in this pleasant gentleman. He has RLS.  Depression not likely contributor to fatigue, sleepiness, GDS was 2. MOCA 24/30.  Plan: Treatment plan and additional workup:  1. At last visit, an order was emailed to [place], Database administrator for a new CPAP machine, I need a download and autotitration 6-12 cm water for 30 days."  I have no record of these results. Results may have been sent to Dr. Brett Fairy in the sleep lab. 2. Tramadol prescribed for back pain and helps with RLS, may continue.  3.  propanolol- he has this to use for tremor prn- he states he is not bothered by tremor though. 4. Follow up with Dr. Brett Fairy in 6 months, sooner as needed.  Return in about 6 months (around 11/27/2014) for OSA on CPAP.  Philmore Pali, MSN, NP-C 05/29/2014, 10:35 AM [facility] [address] [place], [place] [address] [phone]  Note: This document was prepared with digital dictation and possible smart phrase technology. Any transcriptional errors that result from this process are unintentional.

## 2014-05-29 ENCOUNTER — Ambulatory Visit: Payer: Medicare Other | Admitting: Nurse Practitioner

## 2014-05-29 ENCOUNTER — Encounter: Payer: Self-pay | Admitting: Nurse Practitioner

## 2014-05-29 DIAGNOSIS — M6281 Muscle weakness (generalized): Secondary | ICD-10-CM | POA: Diagnosis not present

## 2014-05-29 DIAGNOSIS — R262 Difficulty in walking, not elsewhere classified: Secondary | ICD-10-CM | POA: Diagnosis not present

## 2014-05-29 NOTE — Progress Notes (Signed)
I agree with the assessment and plan as directed by NP .The patient is known to me .   DOHMEIER,CARMEN, MD  

## 2014-06-02 DIAGNOSIS — R262 Difficulty in walking, not elsewhere classified: Secondary | ICD-10-CM | POA: Diagnosis not present

## 2014-06-02 DIAGNOSIS — M6281 Muscle weakness (generalized): Secondary | ICD-10-CM | POA: Diagnosis not present

## 2014-06-04 DIAGNOSIS — R269 Unspecified abnormalities of gait and mobility: Secondary | ICD-10-CM | POA: Diagnosis not present

## 2014-06-04 DIAGNOSIS — R293 Abnormal posture: Secondary | ICD-10-CM | POA: Diagnosis not present

## 2014-06-04 DIAGNOSIS — M6281 Muscle weakness (generalized): Secondary | ICD-10-CM | POA: Diagnosis not present

## 2014-06-09 DIAGNOSIS — R269 Unspecified abnormalities of gait and mobility: Secondary | ICD-10-CM | POA: Diagnosis not present

## 2014-06-09 DIAGNOSIS — R293 Abnormal posture: Secondary | ICD-10-CM | POA: Diagnosis not present

## 2014-06-09 DIAGNOSIS — M6281 Muscle weakness (generalized): Secondary | ICD-10-CM | POA: Diagnosis not present

## 2014-06-10 DIAGNOSIS — M47812 Spondylosis without myelopathy or radiculopathy, cervical region: Secondary | ICD-10-CM | POA: Diagnosis not present

## 2014-06-10 DIAGNOSIS — M19049 Primary osteoarthritis, unspecified hand: Secondary | ICD-10-CM | POA: Diagnosis not present

## 2014-06-11 DIAGNOSIS — R293 Abnormal posture: Secondary | ICD-10-CM | POA: Diagnosis not present

## 2014-06-11 DIAGNOSIS — M6281 Muscle weakness (generalized): Secondary | ICD-10-CM | POA: Diagnosis not present

## 2014-06-11 DIAGNOSIS — R269 Unspecified abnormalities of gait and mobility: Secondary | ICD-10-CM | POA: Diagnosis not present

## 2014-06-16 DIAGNOSIS — R293 Abnormal posture: Secondary | ICD-10-CM | POA: Diagnosis not present

## 2014-06-16 DIAGNOSIS — M6281 Muscle weakness (generalized): Secondary | ICD-10-CM | POA: Diagnosis not present

## 2014-06-16 DIAGNOSIS — R269 Unspecified abnormalities of gait and mobility: Secondary | ICD-10-CM | POA: Diagnosis not present

## 2014-06-18 DIAGNOSIS — R269 Unspecified abnormalities of gait and mobility: Secondary | ICD-10-CM | POA: Diagnosis not present

## 2014-06-18 DIAGNOSIS — R293 Abnormal posture: Secondary | ICD-10-CM | POA: Diagnosis not present

## 2014-06-18 DIAGNOSIS — M6281 Muscle weakness (generalized): Secondary | ICD-10-CM | POA: Diagnosis not present

## 2014-06-23 DIAGNOSIS — R293 Abnormal posture: Secondary | ICD-10-CM | POA: Diagnosis not present

## 2014-06-23 DIAGNOSIS — R269 Unspecified abnormalities of gait and mobility: Secondary | ICD-10-CM | POA: Diagnosis not present

## 2014-06-23 DIAGNOSIS — M6281 Muscle weakness (generalized): Secondary | ICD-10-CM | POA: Diagnosis not present

## 2014-06-25 DIAGNOSIS — R293 Abnormal posture: Secondary | ICD-10-CM | POA: Diagnosis not present

## 2014-06-25 DIAGNOSIS — M6281 Muscle weakness (generalized): Secondary | ICD-10-CM | POA: Diagnosis not present

## 2014-06-25 DIAGNOSIS — R269 Unspecified abnormalities of gait and mobility: Secondary | ICD-10-CM | POA: Diagnosis not present

## 2014-06-29 DIAGNOSIS — H26499 Other secondary cataract, unspecified eye: Secondary | ICD-10-CM | POA: Diagnosis not present

## 2014-06-29 DIAGNOSIS — H35379 Puckering of macula, unspecified eye: Secondary | ICD-10-CM | POA: Diagnosis not present

## 2014-06-30 DIAGNOSIS — R293 Abnormal posture: Secondary | ICD-10-CM | POA: Diagnosis not present

## 2014-06-30 DIAGNOSIS — R269 Unspecified abnormalities of gait and mobility: Secondary | ICD-10-CM | POA: Diagnosis not present

## 2014-06-30 DIAGNOSIS — M6281 Muscle weakness (generalized): Secondary | ICD-10-CM | POA: Diagnosis not present

## 2014-07-02 DIAGNOSIS — R293 Abnormal posture: Secondary | ICD-10-CM | POA: Diagnosis not present

## 2014-07-02 DIAGNOSIS — R269 Unspecified abnormalities of gait and mobility: Secondary | ICD-10-CM | POA: Diagnosis not present

## 2014-07-02 DIAGNOSIS — M6281 Muscle weakness (generalized): Secondary | ICD-10-CM | POA: Diagnosis not present

## 2014-07-07 DIAGNOSIS — M6281 Muscle weakness (generalized): Secondary | ICD-10-CM | POA: Diagnosis not present

## 2014-07-07 DIAGNOSIS — R293 Abnormal posture: Secondary | ICD-10-CM | POA: Diagnosis not present

## 2014-07-09 DIAGNOSIS — M6281 Muscle weakness (generalized): Secondary | ICD-10-CM | POA: Diagnosis not present

## 2014-07-09 DIAGNOSIS — R293 Abnormal posture: Secondary | ICD-10-CM | POA: Diagnosis not present

## 2014-07-14 DIAGNOSIS — R293 Abnormal posture: Secondary | ICD-10-CM | POA: Diagnosis not present

## 2014-07-14 DIAGNOSIS — M6281 Muscle weakness (generalized): Secondary | ICD-10-CM | POA: Diagnosis not present

## 2014-07-15 DIAGNOSIS — R7309 Other abnormal glucose: Secondary | ICD-10-CM | POA: Diagnosis not present

## 2014-07-15 DIAGNOSIS — R809 Proteinuria, unspecified: Secondary | ICD-10-CM | POA: Diagnosis not present

## 2014-07-15 DIAGNOSIS — R82998 Other abnormal findings in urine: Secondary | ICD-10-CM | POA: Diagnosis not present

## 2014-07-15 DIAGNOSIS — E785 Hyperlipidemia, unspecified: Secondary | ICD-10-CM | POA: Diagnosis not present

## 2014-07-15 DIAGNOSIS — Z79899 Other long term (current) drug therapy: Secondary | ICD-10-CM | POA: Diagnosis not present

## 2014-07-15 DIAGNOSIS — M810 Age-related osteoporosis without current pathological fracture: Secondary | ICD-10-CM | POA: Diagnosis not present

## 2014-07-16 DIAGNOSIS — M19049 Primary osteoarthritis, unspecified hand: Secondary | ICD-10-CM | POA: Diagnosis not present

## 2014-07-16 DIAGNOSIS — M48061 Spinal stenosis, lumbar region without neurogenic claudication: Secondary | ICD-10-CM | POA: Diagnosis not present

## 2014-07-17 DIAGNOSIS — M6281 Muscle weakness (generalized): Secondary | ICD-10-CM | POA: Diagnosis not present

## 2014-07-17 DIAGNOSIS — R293 Abnormal posture: Secondary | ICD-10-CM | POA: Diagnosis not present

## 2014-07-21 DIAGNOSIS — M6281 Muscle weakness (generalized): Secondary | ICD-10-CM | POA: Diagnosis not present

## 2014-07-21 DIAGNOSIS — R293 Abnormal posture: Secondary | ICD-10-CM | POA: Diagnosis not present

## 2014-07-23 DIAGNOSIS — E785 Hyperlipidemia, unspecified: Secondary | ICD-10-CM | POA: Diagnosis not present

## 2014-07-23 DIAGNOSIS — F3289 Other specified depressive episodes: Secondary | ICD-10-CM | POA: Diagnosis not present

## 2014-07-23 DIAGNOSIS — Z1331 Encounter for screening for depression: Secondary | ICD-10-CM | POA: Diagnosis not present

## 2014-07-23 DIAGNOSIS — M6281 Muscle weakness (generalized): Secondary | ICD-10-CM | POA: Diagnosis not present

## 2014-07-23 DIAGNOSIS — R5383 Other fatigue: Secondary | ICD-10-CM | POA: Diagnosis not present

## 2014-07-23 DIAGNOSIS — R293 Abnormal posture: Secondary | ICD-10-CM | POA: Diagnosis not present

## 2014-07-23 DIAGNOSIS — H02409 Unspecified ptosis of unspecified eyelid: Secondary | ICD-10-CM | POA: Diagnosis not present

## 2014-07-23 DIAGNOSIS — Z0000 Encounter for general adult medical examination without abnormal findings: Secondary | ICD-10-CM | POA: Diagnosis not present

## 2014-07-23 DIAGNOSIS — M810 Age-related osteoporosis without current pathological fracture: Secondary | ICD-10-CM | POA: Diagnosis not present

## 2014-07-23 DIAGNOSIS — I2510 Atherosclerotic heart disease of native coronary artery without angina pectoris: Secondary | ICD-10-CM | POA: Diagnosis not present

## 2014-07-23 DIAGNOSIS — G2581 Restless legs syndrome: Secondary | ICD-10-CM | POA: Diagnosis not present

## 2014-07-23 DIAGNOSIS — R5381 Other malaise: Secondary | ICD-10-CM | POA: Diagnosis not present

## 2014-07-23 DIAGNOSIS — F329 Major depressive disorder, single episode, unspecified: Secondary | ICD-10-CM | POA: Diagnosis not present

## 2014-07-27 DIAGNOSIS — Z1212 Encounter for screening for malignant neoplasm of rectum: Secondary | ICD-10-CM | POA: Diagnosis not present

## 2014-07-28 DIAGNOSIS — R293 Abnormal posture: Secondary | ICD-10-CM | POA: Diagnosis not present

## 2014-07-28 DIAGNOSIS — M6281 Muscle weakness (generalized): Secondary | ICD-10-CM | POA: Diagnosis not present

## 2014-07-30 ENCOUNTER — Encounter: Payer: Self-pay | Admitting: Gastroenterology

## 2014-07-30 DIAGNOSIS — M6281 Muscle weakness (generalized): Secondary | ICD-10-CM | POA: Diagnosis not present

## 2014-07-30 DIAGNOSIS — R293 Abnormal posture: Secondary | ICD-10-CM | POA: Diagnosis not present

## 2014-08-04 DIAGNOSIS — M6281 Muscle weakness (generalized): Secondary | ICD-10-CM | POA: Diagnosis not present

## 2014-08-04 DIAGNOSIS — R293 Abnormal posture: Secondary | ICD-10-CM | POA: Diagnosis not present

## 2014-08-06 DIAGNOSIS — R293 Abnormal posture: Secondary | ICD-10-CM | POA: Diagnosis not present

## 2014-08-06 DIAGNOSIS — M6281 Muscle weakness (generalized): Secondary | ICD-10-CM | POA: Diagnosis not present

## 2014-08-11 DIAGNOSIS — M6281 Muscle weakness (generalized): Secondary | ICD-10-CM | POA: Diagnosis not present

## 2014-08-11 DIAGNOSIS — R293 Abnormal posture: Secondary | ICD-10-CM | POA: Diagnosis not present

## 2014-08-14 DIAGNOSIS — N401 Enlarged prostate with lower urinary tract symptoms: Secondary | ICD-10-CM | POA: Diagnosis not present

## 2014-08-14 DIAGNOSIS — R351 Nocturia: Secondary | ICD-10-CM | POA: Diagnosis not present

## 2014-08-14 DIAGNOSIS — R972 Elevated prostate specific antigen [PSA]: Secondary | ICD-10-CM | POA: Diagnosis not present

## 2014-08-14 DIAGNOSIS — R350 Frequency of micturition: Secondary | ICD-10-CM | POA: Diagnosis not present

## 2014-08-21 DIAGNOSIS — N500 Atrophy of testis: Secondary | ICD-10-CM | POA: Diagnosis not present

## 2014-08-21 DIAGNOSIS — N401 Enlarged prostate with lower urinary tract symptoms: Secondary | ICD-10-CM | POA: Diagnosis not present

## 2014-08-25 DIAGNOSIS — Z23 Encounter for immunization: Secondary | ICD-10-CM | POA: Diagnosis not present

## 2014-09-03 DIAGNOSIS — M4806 Spinal stenosis, lumbar region: Secondary | ICD-10-CM | POA: Diagnosis not present

## 2014-09-03 DIAGNOSIS — M961 Postlaminectomy syndrome, not elsewhere classified: Secondary | ICD-10-CM | POA: Diagnosis not present

## 2014-09-21 ENCOUNTER — Other Ambulatory Visit: Payer: Self-pay | Admitting: Gastroenterology

## 2014-10-12 DIAGNOSIS — H02831 Dermatochalasis of right upper eyelid: Secondary | ICD-10-CM | POA: Diagnosis not present

## 2014-10-12 DIAGNOSIS — L908 Other atrophic disorders of skin: Secondary | ICD-10-CM | POA: Diagnosis not present

## 2014-10-12 DIAGNOSIS — H02834 Dermatochalasis of left upper eyelid: Secondary | ICD-10-CM | POA: Diagnosis not present

## 2014-10-21 DIAGNOSIS — G4733 Obstructive sleep apnea (adult) (pediatric): Secondary | ICD-10-CM | POA: Diagnosis not present

## 2014-10-21 DIAGNOSIS — F329 Major depressive disorder, single episode, unspecified: Secondary | ICD-10-CM | POA: Diagnosis not present

## 2014-10-21 DIAGNOSIS — Z6827 Body mass index (BMI) 27.0-27.9, adult: Secondary | ICD-10-CM | POA: Diagnosis not present

## 2014-10-21 DIAGNOSIS — G2581 Restless legs syndrome: Secondary | ICD-10-CM | POA: Diagnosis not present

## 2014-10-21 DIAGNOSIS — I2510 Atherosclerotic heart disease of native coronary artery without angina pectoris: Secondary | ICD-10-CM | POA: Diagnosis not present

## 2014-12-01 ENCOUNTER — Ambulatory Visit: Payer: Medicare Other | Admitting: Neurology

## 2014-12-10 DIAGNOSIS — M19041 Primary osteoarthritis, right hand: Secondary | ICD-10-CM | POA: Diagnosis not present

## 2014-12-10 DIAGNOSIS — M47812 Spondylosis without myelopathy or radiculopathy, cervical region: Secondary | ICD-10-CM | POA: Diagnosis not present

## 2014-12-10 DIAGNOSIS — M4806 Spinal stenosis, lumbar region: Secondary | ICD-10-CM | POA: Diagnosis not present

## 2014-12-11 ENCOUNTER — Encounter: Payer: Self-pay | Admitting: Neurology

## 2014-12-11 ENCOUNTER — Ambulatory Visit (INDEPENDENT_AMBULATORY_CARE_PROVIDER_SITE_OTHER): Payer: Medicare Other | Admitting: Neurology

## 2014-12-11 VITALS — BP 114/60 | HR 55 | Temp 97.5°F | Resp 15 | Ht 65.0 in | Wt 181.0 lb

## 2014-12-11 DIAGNOSIS — F32A Depression, unspecified: Secondary | ICD-10-CM | POA: Insufficient documentation

## 2014-12-11 DIAGNOSIS — Z9989 Dependence on other enabling machines and devices: Principal | ICD-10-CM

## 2014-12-11 DIAGNOSIS — F329 Major depressive disorder, single episode, unspecified: Secondary | ICD-10-CM

## 2014-12-11 DIAGNOSIS — G250 Essential tremor: Secondary | ICD-10-CM | POA: Diagnosis not present

## 2014-12-11 DIAGNOSIS — G4733 Obstructive sleep apnea (adult) (pediatric): Secondary | ICD-10-CM | POA: Diagnosis not present

## 2014-12-11 NOTE — Patient Instructions (Signed)

## 2014-12-11 NOTE — Progress Notes (Signed)
[facility]  Provider:  Larey Seat, M D  Referring Provider: Leanna Battles, MD Primary Care Physician:  Donnajean Lopes, MD  Chief Complaint  Patient presents with  . Follow-up    rm 10,OSA    HPI:  RIVAAN KENDALL is a 81 y.o. , right handed , caucasian , retired Arboriculturist . He was seen on 05-28-14 , referral  from Dr. Philip Aspen for a sleep apnea Re- evaluation;  Mr. Banton recalls  being tested for sleep apnea about 10 or 11 years ago. It was his primary care physician Dr. Sharlett Iles who referred him for the sleep test at the time to [employer]. He received the diagnosis of a the sleep apnea and was prescribed a CPAP machine. Currently his durable medical equipment company is [employer]. He brought his machine here but unfortunately I was soft for does not allow to read the [employer] memory chip. He received a new machine just earlier this year, March 19th. 2015  Dr. Philip Aspen was the prescribing physician.  He feels that his machine works fine for him but is unaware of the current settings. He wears a  Lexicographer Fullface mask. He just got that new , too.  He was not required to undergo a new sleep test to qualify ? Medicare approval usually depends on a re evaluation.  [name]  reports a bed time between 10.30 to 11 PM and falls asleep promptly. He has no TV in the bedroom and watches in the den before going to bed. He sleeps often while the TV is runnig, his wife reports. His father was the same well. He will have 2 nocturia  breaks, since using CPAP 12 years ago. He has been using hydroxyzine as a sleep aid every night, 50 mg.  He wakes up feeling refreshed, after 7 hours of sleep. He rises at 6.30, uses an alarm . He drinks no caffeine.  No ETOH and no tobacco use reported.  He is falling asleep here reporting his medical history.... It appears , that Mr. Marchetti has a resting tremor and los of facial mimic as well as hypophonia.  His  wife reports that he has been yelling and thrashing in his sleep, but falls back asleep within minutes, doesn't leave the bed.    January 8 th 2016 :  Mr. Tindel was seen in every visit within 6 weeks after his initial consultation last year by my nurse practitioner Charlott Holler. He is here today for follow-up on CPAP compliance. Mr. Pellecchia now uses CPAP at 8 cm water with full-time EPR of 1 cm his average time of use for CPAP is 7 hours and 11 minutes and he has a 97% compliance for 30 out of 30 days and 20 a days of over 4 hours continued use. His AHI was 10.8 which is rather high. It is related to any air leaks as I can see be obtained the tracing today here in office 12-11-14 and the patient has 3.7 obstructive residual apneas per hour of sleep. He has never used an hour total titrated before but his machine can be used as such. He would not need to obtain a different machine-  just a temporary change in settings.  I would like for him to use the CPAP between 6 at 10 cm water continue with heated humidity and with his current mask as his air leaks are low. DME is [employer] .  The patient endorsed fatigue severity score at  30 points and the Epworth sleepiness score at 8 points and the geriatric depression score at 4 points.   Review of Systems: Out of a complete 14 system review, the patient complains of only the following symptoms, and all other reviewed systems are negative. On CPAP, highly compliant,  interogated the machine.   8 cm water .   History   Social History  . Marital Status: Married    Spouse Name: [name]    Number of Children: 3  . Years of Education: College   Occupational History  . Retired     retired Arboriculturist   Social History Main Topics  . Smoking status: Never Smoker   . Smokeless tobacco: Never Used  . Alcohol Use: No  . Drug Use: No  . Sexual Activity: Not on file   Other Topics Concern  . Not on file   Social History Narrative   Patient is married [name]).    Patient is a retired Arboriculturist.   Patient lives in [place] living facility.   Patient has three adult children.   Patient does not drink any caffeine.   Patient is right-handed.    Family History  Problem Relation Age of Onset  . Prostate cancer Father   . CAD Mother   . Arthritis-Osteo Brother     Past Medical History  Diagnosis Date  . GERD (gastroesophageal reflux disease)   . Cataract   . Depression   . Hyperlipidemia   . Osteoporosis   . Barrett's esophagus   . Lymphocytosis 06/2012  . Diverticulosis   . Vitamin D deficiency   . Diverticulosis   . BPH (benign prostatic hyperplasia)   . Murmur   . Constipation   . Arthritis   . Urinary retention with incomplete bladder emptying     pt uses bathroom and within 30 mins needs to go again  . Sleep apnea     wears CPAP  . CAD (coronary artery disease)     only on stress test; never had MI, PCI or CABG.  In 2008, LVEF was reportedly normal.     Past Surgical History  Procedure Laterality Date  . Upper gastrointestinal endoscopy    . Repair spigelian hernia  2011  . Inguinal hernia repair      x4  . Back surgury      f  . Spinal fusion  1996  . Finger surgery Right     right thumb  . Colonoscopy    . Eye surgery Bilateral   . Back surgery  04/2013  . Cataract extraction      Current Outpatient Prescriptions  Medication Sig Dispense Refill  . Acetaminophen 500 MG coapsule Take by mouth 2 (two) times daily.    Marland Kitchen aspirin EC 81 MG tablet Take 81 mg by mouth daily.    . benzonatate (TESSALON) 100 MG capsule Take 100 mg by mouth 3 (three) times daily as needed for cough.    Marland Kitchen buPROPion (WELLBUTRIN SR) 150 MG 12 hr tablet Take 150 mg by mouth 2 (two) times daily.     . carbamide peroxide (DEBROX) 6.5 % otic solution Place 4 drops into both ears daily.    . cholecalciferol (VITAMIN D) 1000 UNITS tablet Take 1,000 Units by mouth daily.    . cyclobenzaprine (FLEXERIL) 5 MG tablet Take 5 mg by mouth 3 (three)  times daily as needed for muscle spasms.    . ferrous sulfate (CVS IRON) 325 (65 FE) MG tablet Take 325 mg  by mouth daily.    . fish oil-omega-3 fatty acids 1000 MG capsule Take 1 g by mouth daily.     Marland Kitchen gabapentin (NEURONTIN) 300 MG capsule     . HYDROcodone-acetaminophen (NORCO) 5-325 MG per tablet Take 1-2 tablets by mouth every 4 (four) hours as needed for pain. 30 tablet 0  . hydrOXYzine (ATARAX/VISTARIL) 50 MG tablet Take 50 mg by mouth at bedtime as needed (sleep).     . lovastatin (MEVACOR) 20 MG tablet Take 1 tablet by mouth at bedtime.    . Multiple Minerals-Vitamins (CITRACAL PLUS BONE DENSITY) TABS Take 2 tablets by mouth daily.    . Multiple Vitamins-Minerals (CENTRUM SILVER ADULT 50+) TABS Take 1 tablet by mouth daily with breakfast.    . nabumetone (RELAFEN) 500 MG tablet Take 500 mg by mouth 2 (two) times daily.    Marland Kitchen omeprazole (PRILOSEC) 40 MG capsule TAKE ONE CAPSULE BY MOUTH TWICE DAILY  180 capsule 1  . propranolol (INDERAL) 10 MG tablet Take 10 mg by mouth daily as needed.    . ranitidine (ZANTAC) 300 MG tablet TAKE ONE TABLET BY MOUTH AT BEDTIME  90 tablet 3  . rOPINIRole (REQUIP) 1 MG tablet Take 1 mg by mouth at bedtime.    . senna (SENOKOT) 8.6 MG tablet Take 1 tablet by mouth daily.    . Tamsulosin HCl (FLOMAX) 0.4 MG CAPS Take 0.4 mg by mouth daily.     . traMADol (ULTRAM) 50 MG tablet Take 100 mg by mouth 2 (two) times daily as needed for pain.     Marland Kitchen zoledronic acid (RECLAST) 5 MG/100ML SOLN injection Inject 5 mg into the vein once. Start date 01/22/2013     No current facility-administered medications for this visit.    Allergies as of 12/11/2014 - Review Complete 12/11/2014  Allergen Reaction Noted  . Celebrex [celecoxib] Other (See Comments) 04/23/2012    Vitals: BP 114/60 mmHg  Pulse 55  Temp(Src) 97.5 F (36.4 C) (Oral)  Resp 15  Ht 5\' 5"  (1.651 m)  Wt 181 lb (82.101 kg)  BMI 30.12 kg/m2 Last Weight:  Wt Readings from Last 1 Encounters:  12/11/14  181 lb (82.101 kg)   Last Height:   Ht Readings from Last 1 Encounters:  12/11/14 5\' 5"  (1.651 m)    Physical exam:  General: The patient is awake, alert and appears not in acute distress. The patient is well groomed. Head: Normocephalic, atraumatic. Neck is supple. Mallampati 2, neck circumference: 16 inches. He has a tongue tremor,  A droopy left mouth. Reduced facial expression.  Cardiovascular:  Regular rate and rhythm , without carotid bruit, and without distended neck veins. Mitral murmur?  Respiratory: Lungs are clear to auscultation. Skin:  Without evidence of edema, or rash Trunk: BMI is normal, as is  posture.  Neurologic exam : The patient is fatigued, drowsy. oriented to place and time.  Memory subjective  described as delayed. There is a normal attention span & concentration ability.  Speech is fluent without  dysarthria, dysphonia or aphasia. Mood and affect are appropriate.  Cranial nerves: Pupils are equal and briskly reactive to light. Funduscopic exam without  evidence of pallor or edema. Status post cataract surgery - dry eyes.  Extraocular movements  in vertical and horizontal planes intact and without nystagmus. Visual fields by finger perimetry are intact. Hearing to finger rub intact.  Facial sensation intact to fine touch. Facial motor strength - left sided droop,  tongue and uvula move midline.  No fasciculations , not titubation.   Motor exam:  Cogwheeling mildly- over either biceps.  normal muscle bulk and symmetric normal strength in all extremities. Very  strong grip remains present. .   Sensory:  Fine touch, pinprick and vibration were tested in all extremities. Proprioception is  normal.  Coordination: Rapid alternating movements in the fingers/hands is tested and normal. Finger-to-nose maneuver tested and  with dysmetria and  Tremor- more on the right , dominant side. Patient reports tremor impairs his hand writing.   Gait and station: Patient walks  without assistive device - Strength within normal limits. Stance is stable and normal.  Steps are unfragmented.  Deep tendon reflexes:  Status post lumbar fusion , times 2 , several levels  in the  upper  extremities are symmetric and intact.   Assessment:  After physical and neurologic examination, review of laboratory studies, imaging, neurophysiology testing and pre-existing records, assessment is :  Visit 20 minutes with download of machine ,tracing reviewed and mask fit demonstrated, depression and sleepiness cores obtained, 50% of the time spent with face to face conversation, information about the treatment and diagnosis , improving the AHI.   1) OSA confirmed and presenting with subjective improvemtn on CPAP, but high residual AHi of 10. I will change to auto set.  He is highly compliant with his CPAP>  I would like for him to have the brand new air sense machine used as an auto-titrator , to find the best setting. 6-10 cm water pressure window-  DME is still [employer].  2) I suspect tremor related to an  Early PD, or essential tremor as it is symmetric. Propranolol bid now, dr Philip Aspen dosed the medication.  REM BD in the setting of early Parkinson's disease coud be present in this pleasant gentleman.  He has RLS. He has pain in his posterior leg, hamstrings. Dr Louanne Skye addresses pain with Nubametone b id.  3) depression present , but not likely contributor to fatigue , sleepiness, as his GDS was 4 , he is in treatment for 25 years, and has had multiple relapses. . .    Plan:  Treatment plan and additional workup : OSA not well controlled on 8 cm water. Return to autoset.  1) Nabumatone per dr . Louanne Skye will defer the pain care ot him. He  prescribed for back pain and helps with RLS, may continue.  2) propanolol- he is now  taking it- he should bid

## 2014-12-15 DIAGNOSIS — D1801 Hemangioma of skin and subcutaneous tissue: Secondary | ICD-10-CM | POA: Diagnosis not present

## 2014-12-15 DIAGNOSIS — L821 Other seborrheic keratosis: Secondary | ICD-10-CM | POA: Diagnosis not present

## 2015-01-07 DIAGNOSIS — M810 Age-related osteoporosis without current pathological fracture: Secondary | ICD-10-CM | POA: Diagnosis not present

## 2015-01-07 DIAGNOSIS — E559 Vitamin D deficiency, unspecified: Secondary | ICD-10-CM | POA: Diagnosis not present

## 2015-01-07 DIAGNOSIS — Z6826 Body mass index (BMI) 26.0-26.9, adult: Secondary | ICD-10-CM | POA: Diagnosis not present

## 2015-01-08 DIAGNOSIS — M810 Age-related osteoporosis without current pathological fracture: Secondary | ICD-10-CM | POA: Diagnosis not present

## 2015-01-11 DIAGNOSIS — H35373 Puckering of macula, bilateral: Secondary | ICD-10-CM | POA: Diagnosis not present

## 2015-01-11 DIAGNOSIS — H26493 Other secondary cataract, bilateral: Secondary | ICD-10-CM | POA: Diagnosis not present

## 2015-01-12 ENCOUNTER — Ambulatory Visit (INDEPENDENT_AMBULATORY_CARE_PROVIDER_SITE_OTHER): Payer: Medicare Other | Admitting: Gastroenterology

## 2015-01-12 ENCOUNTER — Encounter: Payer: Self-pay | Admitting: Gastroenterology

## 2015-01-12 VITALS — BP 120/66 | HR 64 | Ht 67.0 in | Wt 180.6 lb

## 2015-01-12 DIAGNOSIS — K2270 Barrett's esophagus without dysplasia: Secondary | ICD-10-CM | POA: Diagnosis not present

## 2015-01-12 DIAGNOSIS — R05 Cough: Secondary | ICD-10-CM | POA: Diagnosis not present

## 2015-01-12 DIAGNOSIS — J029 Acute pharyngitis, unspecified: Secondary | ICD-10-CM

## 2015-01-12 DIAGNOSIS — R059 Cough, unspecified: Secondary | ICD-10-CM

## 2015-01-12 NOTE — Progress Notes (Signed)
History of Present Illness: This is an 81 year old male accompanied by his wife. Complains of a sore spot in his throat and intermittent episodes of coughing. Frequently coughing symptoms are precipitated by eating or drinking. His reflux symptoms are under excellent control on omeprazole 40 mg twice daily. He has no symptoms of true esophageal dysphagia.   Allergies  Allergen Reactions  . Celebrex [Celecoxib] Other (See Comments)    Created stomach ulcers.   Outpatient Prescriptions Prior to Visit  Medication Sig Dispense Refill  . Acetaminophen 500 MG coapsule Take by mouth 2 (two) times daily.    Marland Kitchen aspirin EC 81 MG tablet Take 81 mg by mouth daily.    Marland Kitchen buPROPion (WELLBUTRIN SR) 150 MG 12 hr tablet Take 150 mg by mouth 2 (two) times daily.     . cholecalciferol (VITAMIN D) 1000 UNITS tablet Take 1,000 Units by mouth daily.    . ferrous sulfate ([employer] IRON) 325 (65 FE) MG tablet Take 325 mg by mouth daily.    . fish oil-omega-3 fatty acids 1000 MG capsule Take 1 g by mouth daily.     Marland Kitchen gabapentin (NEURONTIN) 300 MG capsule     . hydrOXYzine (ATARAX/VISTARIL) 50 MG tablet Take 50 mg by mouth at bedtime as needed (sleep).     . lovastatin (MEVACOR) 20 MG tablet Take 1 tablet by mouth at bedtime.    . Multiple Minerals-Vitamins (CITRACAL PLUS BONE DENSITY) TABS Take 2 tablets by mouth daily.    . Multiple Vitamins-Minerals (CENTRUM SILVER ADULT 50+) TABS Take 1 tablet by mouth daily with breakfast.    . nabumetone (RELAFEN) 500 MG tablet Take 500 mg by mouth 2 (two) times daily.    Marland Kitchen omeprazole (PRILOSEC) 40 MG capsule TAKE ONE CAPSULE BY MOUTH TWICE DAILY  180 capsule 1  . propranolol (INDERAL) 10 MG tablet Take 10 mg by mouth daily as needed.    . ranitidine (ZANTAC) 300 MG tablet TAKE ONE TABLET BY MOUTH AT BEDTIME  90 tablet 3  . rOPINIRole (REQUIP) 1 MG tablet Take 1 mg by mouth at bedtime.    . senna (SENOKOT) 8.6 MG tablet Take 1 tablet by mouth daily.    . Tamsulosin HCl (FLOMAX)  0.4 MG CAPS Take 0.4 mg by mouth daily.     . traMADol (ULTRAM) 50 MG tablet Take 100 mg by mouth 2 (two) times daily as needed for pain.     Marland Kitchen zoledronic acid (RECLAST) 5 MG/100ML SOLN injection Inject 5 mg into the vein once. Start date 01/22/2013    . benzonatate (TESSALON) 100 MG capsule Take 100 mg by mouth 3 (three) times daily as needed for cough.    . carbamide peroxide (DEBROX) 6.5 % otic solution Place 4 drops into both ears daily.    . cyclobenzaprine (FLEXERIL) 5 MG tablet Take 5 mg by mouth 3 (three) times daily as needed for muscle spasms.    Marland Kitchen HYDROcodone-acetaminophen (NORCO) 5-325 MG per tablet Take 1-2 tablets by mouth every 4 (four) hours as needed for pain. 30 tablet 0   No facility-administered medications prior to visit.   Past Medical History  Diagnosis Date  . GERD (gastroesophageal reflux disease)   . Cataract   . Depression   . Hyperlipidemia   . Osteoporosis   . Barrett's esophagus   . Lymphocytosis 06/2012  . Diverticulosis   . Vitamin D deficiency   . Diverticulosis   . BPH (benign prostatic hyperplasia)   . Murmur   .  Constipation   . Arthritis   . Urinary retention with incomplete bladder emptying     pt uses bathroom and within 30 mins needs to go again  . Sleep apnea     wears CPAP  . CAD (coronary artery disease)     only on stress test; never had MI, PCI or CABG.  In 2008, LVEF was reportedly normal.   . AVM (arteriovenous malformation)    Past Surgical History  Procedure Laterality Date  . Upper gastrointestinal endoscopy    . Repair spigelian hernia  2011  . Inguinal hernia repair      x4  . Back surgury      f  . Spinal fusion  1996  . Finger surgery Right     right thumb  . Colonoscopy    . Eye surgery Bilateral   . Back surgery  04/2013  . Cataract extraction     History   Social History  . Marital Status: Married    Spouse Name: [name]    Number of Children: 3  . Years of Education: College   Occupational History  .  Retired     retired Arboriculturist   Social History Main Topics  . Smoking status: Never Smoker   . Smokeless tobacco: Never Used  . Alcohol Use: No  . Drug Use: No  . Sexual Activity: None   Other Topics Concern  . None   Social History Narrative   Patient is married [name]).   Patient is a retired Arboriculturist.   Patient lives in [place] living facility.   Patient has three adult children.   Patient does not drink any caffeine.   Patient is right-handed.   Family History  Problem Relation Age of Onset  . Prostate cancer Father   . CAD Mother   . Arthritis-Osteo Brother     Physical Exam: General: Well developed , well nourished, no acute distress Head: Normocephalic and atraumatic Eyes:  sclerae anicteric, EOMI Ears: Normal auditory acuity Mouth: No deformity or lesions Lungs: Clear throughout to auscultation Heart: Regular rate and rhythm; no murmurs, rubs or bruits Abdomen: Soft, non tender and non distended. No masses, hepatosplenomegaly or hernias noted. Normal Bowel sounds Musculoskeletal: Symmetrical with no gross deformities  Pulses:  Normal pulses noted Extremities: No clubbing, cyanosis, edema or deformities noted Neurological: Alert oriented x 4, grossly nonfocal Psychological:  Alert and cooperative. Normal mood and affect  Assessment and Recommendations:  1. Sore throat and cough. Possible oropharyngeal dysphagia. I do not feel that his cough and sore throat are GERD related. GERD has been under excellent control on current regimen. See problem #2. Schedule MBSS. Further evaluation with Dr. Philip Aspen and possibly ENT.  2. Barrett's esophagus and GERD. GERD under excellent control. Continue omeprazole 40 mg twice daily, ranitidine 300 mg hs and antireflux measures. Endoscopy in October 2014 was negative for dysplasia.  3. History of iron deficiency anemia felt secondary to colonic AVMs. Ongoing monitoring per his PCP as this problem is likely to recur.

## 2015-01-12 NOTE — Patient Instructions (Signed)
You have been scheduled for a modified barium swallow on 01/18/15 at 11:00am. Please arrive 15 minutes prior to your test for registration. You will go to [facility] (1st Floor) for your appointment.  Should you need to cancel or reschedule your appointment, please contact [phone] [facility] [place]) or [phone] [place]). _____________________________________________________________________ A Modified Barium Swallow Study, or MBS, is a special x-ray that is taken to check swallowing skills. It is carried out by a Stage manager and a Psychologist, clinical (SLP). During this test, yourmouth, throat, and esophagus, a muscular tube which connects your mouth to your stomach, is checked. The test will help you, your doctor, and the SLP plan what types of foods and liquids are easier for you to swallow. The SLP will also identify positions and ways to help you swallow more easily and safely. What will happen during an MBS? You will be taken to an x-ray room and seated comfortably. You will be asked to swallow small amounts of food and liquid mixed with barium. Barium is a liquid or paste that allows images of your mouth, throat and esophagus to be seen on x-ray. The x-ray captures moving images of the food you are swallowing as it travels from your mouth through your throat and into your esophagus. This test helps identify whether food or liquid is entering your lungs (aspiration). The test also shows which part of your mouth or throat lacks strength or coordination to move the food or liquid in the right direction. This test typically takes 30 minutes to 1 hour to complete. _______________________________________________________________________  Thank you for choosing me and [facility].  Pricilla Riffle. Dagoberto Ligas., MD., Marval Regal  cc: Leanna Battles, MD

## 2015-01-14 ENCOUNTER — Other Ambulatory Visit (HOSPITAL_COMMUNITY): Payer: Self-pay | Admitting: Gastroenterology

## 2015-01-14 DIAGNOSIS — R1310 Dysphagia, unspecified: Secondary | ICD-10-CM

## 2015-01-18 ENCOUNTER — Other Ambulatory Visit (HOSPITAL_COMMUNITY): Payer: Medicare Other

## 2015-01-18 ENCOUNTER — Ambulatory Visit (HOSPITAL_COMMUNITY): Payer: Medicare Other

## 2015-01-22 DIAGNOSIS — M810 Age-related osteoporosis without current pathological fracture: Secondary | ICD-10-CM | POA: Diagnosis not present

## 2015-01-22 DIAGNOSIS — Z6826 Body mass index (BMI) 26.0-26.9, adult: Secondary | ICD-10-CM | POA: Diagnosis not present

## 2015-01-22 DIAGNOSIS — G4733 Obstructive sleep apnea (adult) (pediatric): Secondary | ICD-10-CM | POA: Diagnosis not present

## 2015-01-22 DIAGNOSIS — G2581 Restless legs syndrome: Secondary | ICD-10-CM | POA: Diagnosis not present

## 2015-01-22 DIAGNOSIS — H6123 Impacted cerumen, bilateral: Secondary | ICD-10-CM | POA: Diagnosis not present

## 2015-01-25 ENCOUNTER — Ambulatory Visit (HOSPITAL_COMMUNITY)
Admission: RE | Admit: 2015-01-25 | Discharge: 2015-01-25 | Disposition: A | Discharge status: Home / Self Care | Payer: Medicare Other | Source: Ambulatory Visit | Attending: Gastroenterology | Admitting: Gastroenterology

## 2015-01-25 DIAGNOSIS — I2510 Atherosclerotic heart disease of native coronary artery without angina pectoris: Secondary | ICD-10-CM | POA: Insufficient documentation

## 2015-01-25 DIAGNOSIS — J029 Acute pharyngitis, unspecified: Secondary | ICD-10-CM

## 2015-01-25 DIAGNOSIS — K2270 Barrett's esophagus without dysplasia: Secondary | ICD-10-CM | POA: Diagnosis not present

## 2015-01-25 DIAGNOSIS — E559 Vitamin D deficiency, unspecified: Secondary | ICD-10-CM | POA: Insufficient documentation

## 2015-01-25 DIAGNOSIS — R1310 Dysphagia, unspecified: Secondary | ICD-10-CM

## 2015-01-25 DIAGNOSIS — M810 Age-related osteoporosis without current pathological fracture: Secondary | ICD-10-CM | POA: Insufficient documentation

## 2015-01-25 DIAGNOSIS — E785 Hyperlipidemia, unspecified: Secondary | ICD-10-CM | POA: Insufficient documentation

## 2015-01-25 DIAGNOSIS — R05 Cough: Secondary | ICD-10-CM | POA: Diagnosis not present

## 2015-01-25 DIAGNOSIS — N400 Enlarged prostate without lower urinary tract symptoms: Secondary | ICD-10-CM | POA: Insufficient documentation

## 2015-01-25 DIAGNOSIS — K219 Gastro-esophageal reflux disease without esophagitis: Secondary | ICD-10-CM | POA: Insufficient documentation

## 2015-01-25 DIAGNOSIS — F329 Major depressive disorder, single episode, unspecified: Secondary | ICD-10-CM | POA: Diagnosis not present

## 2015-01-25 DIAGNOSIS — H269 Unspecified cataract: Secondary | ICD-10-CM | POA: Insufficient documentation

## 2015-01-25 DIAGNOSIS — R059 Cough, unspecified: Secondary | ICD-10-CM

## 2015-01-25 NOTE — Procedures (Signed)
Objective Swallowing Evaluation: Modified Barium Swallowing Study  Patient Details  Name: Charles [name] MRN: [id number] Date of Birth: Apr 28, 1931  Today's Date: 01/25/2015 Time: SLP Start Time (ACUTE ONLY): 1119-SLP Stop Time (ACUTE ONLY): 1135 SLP Time Calculation (min) (ACUTE ONLY): 16 min  Past Medical History:  Past Medical History  Diagnosis Date  . GERD (gastroesophageal reflux disease)   . Cataract   . Depression   . Hyperlipidemia   . Osteoporosis   . Barrett's esophagus   . Lymphocytosis 06/2012  . Diverticulosis   . Vitamin D deficiency   . Diverticulosis   . BPH (benign prostatic hyperplasia)   . Murmur   . Constipation   . Arthritis   . Urinary retention with incomplete bladder emptying     pt uses bathroom and within 30 mins needs to go again  . Sleep apnea     wears CPAP  . CAD (coronary artery disease)     only on stress test; never had MI, PCI or CABG.  In 2008, LVEF was reportedly normal.   . AVM (arteriovenous malformation)    Past Surgical History:  Past Surgical History  Procedure Laterality Date  . Upper gastrointestinal endoscopy    . Repair spigelian hernia  2011  . Inguinal hernia repair      x4  . Back surgury      f  . Spinal fusion  1996  . Finger surgery Right     right thumb  . Colonoscopy    . Eye surgery Bilateral   . Back surgery  04/2013  . Cataract extraction     HPI:  HPI: 81 year old male with PMH of GERD, Barretts esophagus recently seen by GI MD for c/o sore spot in throat and intermittent coughing episodes, frequently precipitated by eating or drinking. MD felt that reflux symptoms are well under control and that patient had no symptoms of a true eosphageal dysphagia, suspecting oropharyngeal dysfunction. MBS ordered.   No Data Recorded  Assessment / Plan / Recommendation CHL IP CLINICAL IMPRESSIONS 01/25/2015  Dysphagia Diagnosis Suspected primary esophageal dysphagia  Clinical impression Patient presents with normal  oropharyngeal swallowing function without observed evidence of penetration or aspiration. No coughing noted during todays study. Patient's symptoms lend more towards esophageal dysphagia however note that patient already seen by GI MD who does not feel that patients deficits are related to GER/Barrett's esophagus. Defer further f/u to MD. ? consideration of ENT consult to further r/o origin if not GI in nature.       CHL IP TREATMENT RECOMMENDATION 01/25/2015  Treatment Plan Recommendations No treatment recommended at this time     CHL IP DIET RECOMMENDATION 01/25/2015  Diet Recommendations Regular;Thin liquid  Liquid Administration via Cup;Straw  Medication Administration Whole meds with liquid  Compensations Slow rate;Small sips/bites;Follow solids with liquid  Postural Changes and/or Swallow Maneuvers Seated upright 90 degrees;Upright 30-60 min after meal     CHL IP OTHER RECOMMENDATIONS 01/25/2015  Recommended Consults (None)  Oral Care Recommendations Oral care BID  Other Recommendations (None)     CHL IP FOLLOW UP RECOMMENDATIONS 01/25/2015  Follow up Recommendations None     No flowsheet data found.       SLP Swallow Goals No flowsheet data found.  No flowsheet data found.    CHL IP REASON FOR REFERRAL 01/25/2015  Reason for Referral Objectively evaluate swallowing function     CHL IP ORAL PHASE 01/25/2015  Lips (None)  Tongue (None)  Mucous membranes (  None)  Nutritional status (None)  Other (None)  Oxygen therapy (None)  Oral Phase WFL  Oral - Pudding Teaspoon (None)  Oral - Pudding Cup (None)  Oral - Honey Teaspoon (None)  Oral - Honey Cup (None)  Oral - Honey Syringe (None)  Oral - Nectar Teaspoon (None)  Oral - Nectar Cup (None)  Oral - Nectar Straw (None)  Oral - Nectar Syringe (None)  Oral - Ice Chips (None)  Oral - Thin Teaspoon (None)  Oral - Thin Cup (None)  Oral - Thin Straw (None)  Oral - Thin Syringe (None)  Oral - Puree (None)  Oral - Mechanical  Soft (None)  Oral - Regular (None)  Oral - Multi-consistency (None)  Oral - Pill (None)  Oral Phase - Comment (None)      CHL IP PHARYNGEAL PHASE 01/25/2015  Pharyngeal Phase WFL  Pharyngeal - Pudding Teaspoon (None)  Penetration/Aspiration details (pudding teaspoon) (None)  Pharyngeal - Pudding Cup (None)  Penetration/Aspiration details (pudding cup) (None)  Pharyngeal - Honey Teaspoon (None)  Penetration/Aspiration details (honey teaspoon) (None)  Pharyngeal - Honey Cup (None)  Penetration/Aspiration details (honey cup) (None)  Pharyngeal - Honey Syringe (None)  Penetration/Aspiration details (honey syringe) (None)  Pharyngeal - Nectar Teaspoon (None)  Penetration/Aspiration details (nectar teaspoon) (None)  Pharyngeal - Nectar Cup (None)  Penetration/Aspiration details (nectar cup) (None)  Pharyngeal - Nectar Straw (None)  Penetration/Aspiration details (nectar straw) (None)  Pharyngeal - Nectar Syringe (None)  Penetration/Aspiration details (nectar syringe) (None)  Pharyngeal - Ice Chips (None)  Penetration/Aspiration details (ice chips) (None)  Pharyngeal - Thin Teaspoon (None)  Penetration/Aspiration details (thin teaspoon) (None)  Pharyngeal - Thin Cup (None)  Penetration/Aspiration details (thin cup) (None)  Pharyngeal - Thin Straw (None)  Penetration/Aspiration details (thin straw) (None)  Pharyngeal - Thin Syringe (None)  Penetration/Aspiration details (thin syringe') (None)  Pharyngeal - Puree (None)  Penetration/Aspiration details (puree) (None)  Pharyngeal - Mechanical Soft (None)  Penetration/Aspiration details (mechanical soft) (None)  Pharyngeal - Regular (None)  Penetration/Aspiration details (regular) (None)  Pharyngeal - Multi-consistency (None)  Penetration/Aspiration details (multi-consistency) (None)  Pharyngeal - Pill (None)  Penetration/Aspiration details (pill) (None)  Pharyngeal Comment (None)     CHL IP CERVICAL ESOPHAGEAL PHASE 01/25/2015   Cervical Esophageal Phase WFL  Pudding Teaspoon (None)  Pudding Cup (None)  Honey Teaspoon (None)  Honey Cup (None)  Honey Syringe (None)  Nectar Teaspoon (None)  Nectar Cup (None)  Nectar Straw (None)  Nectar Syringe (None)  Thin Teaspoon (None)  Thin Cup (None)  Thin Straw (None)  Thin Syringe (None)  Cervical Esophageal Comment (None)    CHL IP GO 01/25/2015  Functional Assessment Tool Used skilled clinical judgement  Functional Limitations Swallowing  Swallow Current Status (W1093) [facility]  Swallow Goal Status (A3557) [facility]  Swallow Discharge Status (D2202) [place]  Motor Speech Current Status (R4270) (None)  Motor Speech Goal Status (W2376) (None)  Motor Speech Goal Status (E8315) (None)  Spoken Language Comprehension Current Status (V7616) (None)  Spoken Language Comprehension Goal Status (W7371) (None)  Spoken Language Comprehension Discharge Status (G6269) (None)  Spoken Language Expression Current Status (S8546) (None)  Spoken Language Expression Goal Status (E7035) (None)  Spoken Language Expression Discharge Status [phone]) (None)  Attention Current Status (H8299) (None)  Attention Goal Status (B7169) (None)  Attention Discharge Status (C7893) (None)  Memory Current Status (Y1017) (None)  Memory Goal Status (P1025) (None)  Memory Discharge Status (E5277) (None)  Voice Current Status (O2423) (None)  Voice Goal Status (N3614) (None)  Voice  Discharge Status [phone]) (None)  Other Speech-Language Pathology Functional Limitation [phone]) (None)  Other Speech-Language Pathology Functional Limitation Goal Status (H2094) (None)  Other Speech-Language Pathology Functional Limitation Discharge Status [phone]) (None)   Gabriel Rainwater MA, CCC-SLP [phone]         Allyssa Abruzzese Meryl 01/25/2015, 11:56 AM

## 2015-01-26 DIAGNOSIS — M5136 Other intervertebral disc degeneration, lumbar region: Secondary | ICD-10-CM | POA: Diagnosis not present

## 2015-01-26 DIAGNOSIS — Z6826 Body mass index (BMI) 26.0-26.9, adult: Secondary | ICD-10-CM | POA: Diagnosis not present

## 2015-01-26 DIAGNOSIS — H6123 Impacted cerumen, bilateral: Secondary | ICD-10-CM | POA: Diagnosis not present

## 2015-01-28 DIAGNOSIS — M189 Osteoarthritis of first carpometacarpal joint, unspecified: Secondary | ICD-10-CM | POA: Diagnosis not present

## 2015-01-28 DIAGNOSIS — M47812 Spondylosis without myelopathy or radiculopathy, cervical region: Secondary | ICD-10-CM | POA: Diagnosis not present

## 2015-02-02 DIAGNOSIS — H02409 Unspecified ptosis of unspecified eyelid: Secondary | ICD-10-CM | POA: Insufficient documentation

## 2015-02-02 DIAGNOSIS — Z01818 Encounter for other preprocedural examination: Secondary | ICD-10-CM | POA: Diagnosis not present

## 2015-02-02 DIAGNOSIS — E161 Other hypoglycemia: Secondary | ICD-10-CM | POA: Insufficient documentation

## 2015-02-23 DIAGNOSIS — K2270 Barrett's esophagus without dysplasia: Secondary | ICD-10-CM | POA: Diagnosis not present

## 2015-02-23 DIAGNOSIS — F329 Major depressive disorder, single episode, unspecified: Secondary | ICD-10-CM | POA: Diagnosis not present

## 2015-02-23 DIAGNOSIS — I10 Essential (primary) hypertension: Secondary | ICD-10-CM | POA: Diagnosis not present

## 2015-02-23 DIAGNOSIS — G250 Essential tremor: Secondary | ICD-10-CM | POA: Diagnosis not present

## 2015-02-23 DIAGNOSIS — G4730 Sleep apnea, unspecified: Secondary | ICD-10-CM | POA: Diagnosis not present

## 2015-02-23 DIAGNOSIS — Z886 Allergy status to analgesic agent status: Secondary | ICD-10-CM | POA: Diagnosis not present

## 2015-02-23 DIAGNOSIS — Z79899 Other long term (current) drug therapy: Secondary | ICD-10-CM | POA: Diagnosis not present

## 2015-02-23 DIAGNOSIS — Z85828 Personal history of other malignant neoplasm of skin: Secondary | ICD-10-CM | POA: Diagnosis not present

## 2015-02-23 DIAGNOSIS — H02409 Unspecified ptosis of unspecified eyelid: Secondary | ICD-10-CM | POA: Diagnosis not present

## 2015-02-23 DIAGNOSIS — M4310 Spondylolisthesis, site unspecified: Secondary | ICD-10-CM | POA: Diagnosis not present

## 2015-02-23 DIAGNOSIS — H02834 Dermatochalasis of left upper eyelid: Secondary | ICD-10-CM | POA: Diagnosis not present

## 2015-02-23 DIAGNOSIS — N400 Enlarged prostate without lower urinary tract symptoms: Secondary | ICD-10-CM | POA: Diagnosis not present

## 2015-02-23 DIAGNOSIS — H02831 Dermatochalasis of right upper eyelid: Secondary | ICD-10-CM | POA: Diagnosis not present

## 2015-02-23 DIAGNOSIS — G2581 Restless legs syndrome: Secondary | ICD-10-CM | POA: Diagnosis not present

## 2015-02-23 DIAGNOSIS — K219 Gastro-esophageal reflux disease without esophagitis: Secondary | ICD-10-CM | POA: Diagnosis not present

## 2015-02-23 DIAGNOSIS — K449 Diaphragmatic hernia without obstruction or gangrene: Secondary | ICD-10-CM | POA: Diagnosis not present

## 2015-02-23 DIAGNOSIS — D649 Anemia, unspecified: Secondary | ICD-10-CM | POA: Diagnosis not present

## 2015-02-23 HISTORY — PX: BLEPHAROPLASTY: SUR158

## 2015-03-04 DIAGNOSIS — M810 Age-related osteoporosis without current pathological fracture: Secondary | ICD-10-CM | POA: Diagnosis not present

## 2015-03-04 DIAGNOSIS — Z79899 Other long term (current) drug therapy: Secondary | ICD-10-CM | POA: Diagnosis not present

## 2015-03-16 ENCOUNTER — Other Ambulatory Visit (HOSPITAL_COMMUNITY): Payer: Self-pay | Admitting: Internal Medicine

## 2015-03-16 ENCOUNTER — Other Ambulatory Visit (HOSPITAL_COMMUNITY): Payer: Self-pay

## 2015-03-17 DIAGNOSIS — N401 Enlarged prostate with lower urinary tract symptoms: Secondary | ICD-10-CM | POA: Diagnosis not present

## 2015-03-22 ENCOUNTER — Encounter (HOSPITAL_COMMUNITY): Payer: Self-pay

## 2015-03-22 ENCOUNTER — Ambulatory Visit (HOSPITAL_COMMUNITY)
Admission: RE | Admit: 2015-03-22 | Discharge: 2015-03-22 | Disposition: A | Discharge status: Home / Self Care | Payer: Medicare Other | Source: Ambulatory Visit | Attending: Internal Medicine | Admitting: Internal Medicine

## 2015-03-22 DIAGNOSIS — M810 Age-related osteoporosis without current pathological fracture: Secondary | ICD-10-CM | POA: Insufficient documentation

## 2015-03-22 MED ORDER — SODIUM CHLORIDE 0.9 % IV SOLN
INTRAVENOUS | Status: DC
  Administered 2015-03-22: 250 mL via INTRAVENOUS

## 2015-03-22 MED ORDER — ZOLEDRONIC ACID 5 MG/100ML IV SOLN
5.0000 mg | Freq: Once | INTRAVENOUS | Status: AC
  Administered 2015-03-22: 5 mg via INTRAVENOUS
  Filled 2015-03-22: qty 100

## 2015-03-22 NOTE — Discharge Instructions (Signed)
RECLAST °Zoledronic Acid injection (Paget's Disease, Osteoporosis) °What is this medicine? °ZOLEDRONIC ACID (ZOE le dron ik AS id) lowers the amount of calcium loss from bone. It is used to treat Paget's disease and osteoporosis in women. °This medicine may be used for other purposes; ask your health care provider or pharmacist if you have questions. °COMMON BRAND NAME(S): Reclast, Zometa °What should I tell my health care provider before I take this medicine? °They need to know if you have any of these conditions: °-aspirin-sensitive asthma °-cancer, especially if you are receiving medicines used to treat cancer °-dental disease or wear dentures °-infection °-kidney disease °-low levels of calcium in the blood °-past surgery on the parathyroid gland or intestines °-receiving corticosteroids like dexamethasone or prednisone °-an unusual or allergic reaction to zoledronic acid, other medicines, foods, dyes, or preservatives °-pregnant or trying to get pregnant °-breast-feeding °How should I use this medicine? °This medicine is for infusion into a vein. It is given by a health care professional in a hospital or clinic setting. °Talk to your pediatrician regarding the use of this medicine in children. This medicine is not approved for use in children. °Overdosage: If you think you have taken too much of this medicine contact a poison control center or emergency room at once. °NOTE: This medicine is only for you. Do not share this medicine with others. °What if I miss a dose? °It is important not to miss your dose. Call your doctor or health care professional if you are unable to keep an appointment. °What may interact with this medicine? °-certain antibiotics given by injection °-NSAIDs, medicines for pain and inflammation, like ibuprofen or naproxen °-some diuretics like bumetanide, furosemide °-teriparatide °This list may not describe all possible interactions. Give your health care provider a list of all the  medicines, herbs, non-prescription drugs, or dietary supplements you use. Also tell them if you smoke, drink alcohol, or use illegal drugs. Some items may interact with your medicine. °What should I watch for while using this medicine? °Visit your doctor or health care professional for regular checkups. It may be some time before you see the benefit from this medicine. Do not stop taking your medicine unless your doctor tells you to. Your doctor may order blood tests or other tests to see how you are doing. °Women should inform their doctor if they wish to become pregnant or think they might be pregnant. There is a potential for serious side effects to an unborn child. Talk to your health care professional or pharmacist for more information. °You should make sure that you get enough calcium and vitamin D while you are taking this medicine. Discuss the foods you eat and the vitamins you take with your health care professional. °Some people who take this medicine have severe bone, joint, and/or muscle pain. This medicine may also increase your risk for jaw problems or a broken thigh bone. Tell your doctor right away if you have severe pain in your jaw, bones, joints, or muscles. Tell your doctor if you have any pain that does not go away or that gets worse. °Tell your dentist and dental surgeon that you are taking this medicine. You should not have major dental surgery while on this medicine. See your dentist to have a dental exam and fix any dental problems before starting this medicine. Take good care of your teeth while on this medicine. Make sure you see your dentist for regular follow-up appointments. °What side effects may I notice from receiving this   medicine? °Side effects that you should report to your doctor or health care professional as soon as possible: °-allergic reactions like skin rash, itching or hives, swelling of the face, lips, or tongue °-anxiety, confusion, or depression °-breathing  problems °-changes in vision °-eye pain °-feeling faint or lightheaded, falls °-jaw pain, especially after dental work °-mouth sores °-muscle cramps, stiffness, or weakness °-trouble passing urine or change in the amount of urine °Side effects that usually do not require medical attention (report to your doctor or health care professional if they continue or are bothersome): °-bone, joint, or muscle pain °-constipation °-diarrhea °-fever °-hair loss °-irritation at site where injected °-loss of appetite °-nausea, vomiting °-stomach upset °-trouble sleeping °-trouble swallowing °-weak or tired °This list may not describe all possible side effects. Call your doctor for medical advice about side effects. You may report side effects to FDA at 1-800-FDA-1088. °Where should I keep my medicine? °This drug is given in a hospital or clinic and will not be stored at home. °NOTE: This sheet is a summary. It may not cover all possible information. If you have questions about this medicine, talk to your doctor, pharmacist, or health care provider. °© 2015, [employer]. (2013-05-05 10:03:48) °Osteoporosis °Throughout your life, your body breaks down old bone and replaces it with new bone. As you get older, your body does not replace bone as quickly as it breaks it down. By the age of 30 years, most people begin to gradually lose bone because of the imbalance between bone loss and replacement. Some people lose more bone than others. Bone loss beyond a specified normal degree is considered osteoporosis.  °Osteoporosis affects the strength and durability of your bones. The inside of the ends of your bones and your flat bones, like the bones of your pelvis, look like honeycomb, filled with tiny open spaces. As bone loss occurs, your bones become less dense. This means that the open spaces inside your bones become bigger and the walls between these spaces become thinner. This makes your bones weaker. Bones of a person with  osteoporosis can become so weak that they can break (fracture) during minor accidents, such as a simple fall. °CAUSES  °The following factors have been associated with the development of osteoporosis: °· Smoking. °· Drinking more than 2 alcoholic drinks several days per week. °· Long-term use of certain medicines: °¨ Corticosteroids. °¨ Chemotherapy medicines. °¨ Thyroid medicines. °¨ Antiepileptic medicines. °¨ Gonadal hormone suppression medicine. °¨ Immunosuppression medicine. °· Being underweight. °· Lack of physical activity. °· Lack of exposure to the sun. This can lead to vitamin D deficiency. °· Certain medical conditions: °¨ Certain inflammatory bowel diseases, such as Crohn disease and ulcerative colitis. °¨ Diabetes. °¨ Hyperthyroidism. °¨ Hyperparathyroidism. °RISK FACTORS °Anyone can develop osteoporosis. However, the following factors can increase your risk of developing osteoporosis: °· Gender--Women are at higher risk than men. °· Age--Being older than 50 years increases your risk. °· Ethnicity--White and Asian people have an increased risk. °· Weight --Being extremely underweight can increase your risk of osteoporosis. °· Family history of osteoporosis--Having a family member who has developed osteoporosis can increase your risk. °SYMPTOMS  °Usually, people with osteoporosis have no symptoms.  °DIAGNOSIS  °Signs during a physical exam that may prompt your caregiver to suspect osteoporosis include: °· Decreased height. This is usually caused by the compression of the bones that form your spine (vertebrae) because they have weakened and become fractured. °· A curving or rounding of the upper back (kyphosis). °  To confirm signs of osteoporosis, your caregiver may request a procedure that uses 2 low-dose X-ray beams with different levels of energy to measure your bone mineral density (dual-energy X-ray absorptiometry [DXA]). Also, your caregiver may check your level of vitamin D. °TREATMENT  °The goal of  osteoporosis treatment is to strengthen bones in order to decrease the risk of bone fractures. There are different types of medicines available to help achieve this goal. Some of these medicines work by slowing the processes of bone loss. Some medicines work by increasing bone density. Treatment also involves making sure that your levels of calcium and vitamin D are adequate. °PREVENTION  °There are things you can do to help prevent osteoporosis. Adequate intake of calcium and vitamin D can help you achieve optimal bone mineral density. Regular exercise can also help, especially resistance and weight-bearing activities. If you smoke, quitting smoking is an important part of osteoporosis prevention. °MAKE SURE YOU: °· Understand these instructions. °· Will watch your condition. °· Will get help right away if you are not doing well or get worse. °FOR MORE INFORMATION °www.osteo.org and www.nof.org °Document Released: 08/30/2005 Document Revised: 03/17/2013 Document Reviewed: 11/04/2011 °ExitCare® Patient Information ©2015 [employer]. This information is not intended to replace advice given to you by your health care provider. Make sure you discuss any questions you have with your health care provider. ° ° °

## 2015-03-22 NOTE — Progress Notes (Signed)
Uneventful infusion of RECLAST (pt states" this is my 3rd infusion")Discharged ambulatory accompanied by wife to [facility] exit where they had parked

## 2015-03-24 DIAGNOSIS — N644 Mastodynia: Secondary | ICD-10-CM | POA: Diagnosis not present

## 2015-03-24 DIAGNOSIS — N400 Enlarged prostate without lower urinary tract symptoms: Secondary | ICD-10-CM | POA: Diagnosis not present

## 2015-03-24 DIAGNOSIS — N500 Atrophy of testis: Secondary | ICD-10-CM | POA: Diagnosis not present

## 2015-03-29 ENCOUNTER — Encounter: Payer: Self-pay | Admitting: Neurology

## 2015-04-07 ENCOUNTER — Other Ambulatory Visit: Payer: Self-pay | Admitting: Gastroenterology

## 2015-05-11 DIAGNOSIS — G2581 Restless legs syndrome: Secondary | ICD-10-CM | POA: Diagnosis not present

## 2015-05-11 DIAGNOSIS — I2510 Atherosclerotic heart disease of native coronary artery without angina pectoris: Secondary | ICD-10-CM | POA: Diagnosis not present

## 2015-05-11 DIAGNOSIS — M5136 Other intervertebral disc degeneration, lumbar region: Secondary | ICD-10-CM | POA: Diagnosis not present

## 2015-05-11 DIAGNOSIS — M810 Age-related osteoporosis without current pathological fracture: Secondary | ICD-10-CM | POA: Diagnosis not present

## 2015-05-11 DIAGNOSIS — Z6826 Body mass index (BMI) 26.0-26.9, adult: Secondary | ICD-10-CM | POA: Diagnosis not present

## 2015-05-17 DIAGNOSIS — E559 Vitamin D deficiency, unspecified: Secondary | ICD-10-CM | POA: Diagnosis not present

## 2015-05-17 DIAGNOSIS — M810 Age-related osteoporosis without current pathological fracture: Secondary | ICD-10-CM | POA: Diagnosis not present

## 2015-05-26 DIAGNOSIS — M1812 Unilateral primary osteoarthritis of first carpometacarpal joint, left hand: Secondary | ICD-10-CM | POA: Diagnosis not present

## 2015-07-06 DIAGNOSIS — M533 Sacrococcygeal disorders, not elsewhere classified: Secondary | ICD-10-CM | POA: Diagnosis not present

## 2015-07-09 DIAGNOSIS — M1812 Unilateral primary osteoarthritis of first carpometacarpal joint, left hand: Secondary | ICD-10-CM | POA: Diagnosis not present

## 2015-07-12 ENCOUNTER — Other Ambulatory Visit: Payer: Self-pay | Admitting: Specialist

## 2015-07-12 DIAGNOSIS — H524 Presbyopia: Secondary | ICD-10-CM | POA: Diagnosis not present

## 2015-07-12 DIAGNOSIS — H26493 Other secondary cataract, bilateral: Secondary | ICD-10-CM | POA: Diagnosis not present

## 2015-07-12 DIAGNOSIS — H35373 Puckering of macula, bilateral: Secondary | ICD-10-CM | POA: Diagnosis not present

## 2015-07-12 DIAGNOSIS — M545 Low back pain: Secondary | ICD-10-CM

## 2015-07-14 ENCOUNTER — Ambulatory Visit
Admission: RE | Admit: 2015-07-14 | Discharge: 2015-07-14 | Disposition: A | Discharge status: Home / Self Care | Payer: Medicare Other | Source: Ambulatory Visit | Attending: Specialist | Admitting: Specialist

## 2015-07-14 DIAGNOSIS — M545 Low back pain: Secondary | ICD-10-CM

## 2015-07-14 DIAGNOSIS — M549 Dorsalgia, unspecified: Secondary | ICD-10-CM | POA: Diagnosis not present

## 2015-07-14 DIAGNOSIS — Z981 Arthrodesis status: Secondary | ICD-10-CM | POA: Diagnosis not present

## 2015-07-22 ENCOUNTER — Emergency Department (HOSPITAL_COMMUNITY): Payer: Medicare Other

## 2015-07-22 ENCOUNTER — Ambulatory Visit: Payer: Medicare Other

## 2015-07-22 ENCOUNTER — Emergency Department (HOSPITAL_COMMUNITY)
Admission: EM | Admit: 2015-07-22 | Discharge: 2015-07-22 | Disposition: A | Discharge status: Home / Self Care | Payer: Medicare Other | Attending: Emergency Medicine | Admitting: Emergency Medicine

## 2015-07-22 ENCOUNTER — Ambulatory Visit (INDEPENDENT_AMBULATORY_CARE_PROVIDER_SITE_OTHER): Payer: Medicare Other | Admitting: Emergency Medicine

## 2015-07-22 ENCOUNTER — Encounter (HOSPITAL_COMMUNITY): Payer: Self-pay

## 2015-07-22 VITALS — BP 152/70 | HR 53 | Temp 97.8°F | Resp 18 | Ht 67.0 in | Wt 176.0 lb

## 2015-07-22 DIAGNOSIS — K219 Gastro-esophageal reflux disease without esophagitis: Secondary | ICD-10-CM | POA: Insufficient documentation

## 2015-07-22 DIAGNOSIS — I2510 Atherosclerotic heart disease of native coronary artery without angina pectoris: Secondary | ICD-10-CM | POA: Insufficient documentation

## 2015-07-22 DIAGNOSIS — E785 Hyperlipidemia, unspecified: Secondary | ICD-10-CM | POA: Diagnosis not present

## 2015-07-22 DIAGNOSIS — R011 Cardiac murmur, unspecified: Secondary | ICD-10-CM | POA: Insufficient documentation

## 2015-07-22 DIAGNOSIS — R61 Generalized hyperhidrosis: Secondary | ICD-10-CM | POA: Insufficient documentation

## 2015-07-22 DIAGNOSIS — R1013 Epigastric pain: Secondary | ICD-10-CM | POA: Diagnosis not present

## 2015-07-22 DIAGNOSIS — M1990 Unspecified osteoarthritis, unspecified site: Secondary | ICD-10-CM | POA: Diagnosis not present

## 2015-07-22 DIAGNOSIS — R079 Chest pain, unspecified: Secondary | ICD-10-CM

## 2015-07-22 DIAGNOSIS — Q2730 Arteriovenous malformation, site unspecified: Secondary | ICD-10-CM | POA: Insufficient documentation

## 2015-07-22 DIAGNOSIS — R0789 Other chest pain: Secondary | ICD-10-CM | POA: Diagnosis not present

## 2015-07-22 DIAGNOSIS — N400 Enlarged prostate without lower urinary tract symptoms: Secondary | ICD-10-CM | POA: Diagnosis not present

## 2015-07-22 DIAGNOSIS — Z7982 Long term (current) use of aspirin: Secondary | ICD-10-CM | POA: Insufficient documentation

## 2015-07-22 DIAGNOSIS — E559 Vitamin D deficiency, unspecified: Secondary | ICD-10-CM | POA: Diagnosis not present

## 2015-07-22 DIAGNOSIS — F329 Major depressive disorder, single episode, unspecified: Secondary | ICD-10-CM | POA: Diagnosis not present

## 2015-07-22 DIAGNOSIS — Z862 Personal history of diseases of the blood and blood-forming organs and certain disorders involving the immune mechanism: Secondary | ICD-10-CM | POA: Diagnosis not present

## 2015-07-22 DIAGNOSIS — M810 Age-related osteoporosis without current pathological fracture: Secondary | ICD-10-CM | POA: Diagnosis not present

## 2015-07-22 DIAGNOSIS — Z79899 Other long term (current) drug therapy: Secondary | ICD-10-CM | POA: Insufficient documentation

## 2015-07-22 DIAGNOSIS — K2270 Barrett's esophagus without dysplasia: Secondary | ICD-10-CM | POA: Diagnosis not present

## 2015-07-22 DIAGNOSIS — Z9981 Dependence on supplemental oxygen: Secondary | ICD-10-CM | POA: Insufficient documentation

## 2015-07-22 DIAGNOSIS — G4730 Sleep apnea, unspecified: Secondary | ICD-10-CM | POA: Diagnosis not present

## 2015-07-22 LAB — CBC
HCT: 40.2 % (ref 39.0–52.0)
Hemoglobin: 13.9 g/dL (ref 13.0–17.0)
MCH: 32.1 pg (ref 26.0–34.0)
MCHC: 34.6 g/dL (ref 30.0–36.0)
MCV: 92.8 fL (ref 78.0–100.0)
Platelets: 134 10*3/uL — ABNORMAL LOW (ref 150–400)
RBC: 4.33 MIL/uL (ref 4.22–5.81)
RDW: 13.2 % (ref 11.5–15.5)
WBC: 9.5 10*3/uL (ref 4.0–10.5)

## 2015-07-22 LAB — I-STAT TROPONIN, ED
Troponin i, poc: 0 ng/mL (ref 0.00–0.08)
Troponin i, poc: 0 ng/mL (ref 0.00–0.08)

## 2015-07-22 LAB — BASIC METABOLIC PANEL
Anion gap: 9 (ref 5–15)
BUN: 16 mg/dL (ref 6–20)
CHLORIDE: 106 mmol/L (ref 101–111)
CO2: 22 mmol/L (ref 22–32)
Calcium: 8.8 mg/dL — ABNORMAL LOW (ref 8.9–10.3)
Creatinine, Ser: 0.85 mg/dL (ref 0.61–1.24)
GFR calc Af Amer: >60 mL/min (ref >60)
GFR calc non Af Amer: >60 mL/min (ref >60)
Glucose, Bld: 136 mg/dL — ABNORMAL HIGH (ref 65–99)
Potassium: 3.9 mmol/L (ref 3.5–5.1)
Sodium: 137 mmol/L (ref 135–145)

## 2015-07-22 NOTE — ED Notes (Signed)
Pt. Presents with complaint of epigastric/substernal pain starting around 1330 today following lunch. Pt. Describes pain as 4/10 sharp constant pain. Pt. Given 324 ASA and 2 Nitro. Pt. With hx of stomach ulcer.

## 2015-07-22 NOTE — ED Notes (Signed)
Pt. Ambulated independently in hallway to bathroom with steady, even gait. No complaints of pain or dizziness.

## 2015-07-22 NOTE — ED Notes (Signed)
Pt stated that his restless leg syndrome was acting up so he wanted to stand up....the patient is standing up at the bedside so he can move around...the patient is stable on his feet and NAD.

## 2015-07-22 NOTE — Discharge Instructions (Signed)
Your evaluated in the ED today for your epigastric pain. There is not appear to be an emergent cause her symptoms at this time. Your exam, labs, EKG and chest x-ray were all reassuring. It is important to follow-up with your primary care in 2 days for reevaluation. Return to ED for new or worsening symptoms.  Abdominal Pain Many things can cause abdominal pain. Usually, abdominal pain is not caused by a disease and will improve without treatment. It can often be observed and treated at home. Your health care provider will do a physical exam and possibly order blood tests and X-rays to help determine the seriousness of your pain. However, in many cases, more time must pass before a clear cause of the pain can be found. Before that point, your health care provider may not know if you need more testing or further treatment. HOME CARE INSTRUCTIONS  Monitor your abdominal pain for any changes. The following actions may help to alleviate any discomfort you are experiencing:  Only take over-the-counter or prescription medicines as directed by your health care provider.  Do not take laxatives unless directed to do so by your health care provider.  Try a clear liquid diet (broth, tea, or water) as directed by your health care provider. Slowly move to a bland diet as tolerated. SEEK MEDICAL CARE IF:  You have unexplained abdominal pain.  You have abdominal pain associated with nausea or diarrhea.  You have pain when you urinate or have a bowel movement.  You experience abdominal pain that wakes you in the night.  You have abdominal pain that is worsened or improved by eating food.  You have abdominal pain that is worsened with eating fatty foods.  You have a fever. SEEK IMMEDIATE MEDICAL CARE IF:   Your pain does not go away within 2 hours.  You keep throwing up (vomiting).  Your pain is felt only in portions of the abdomen, such as the right side or the left lower portion of the  abdomen.  You pass bloody or black tarry stools. MAKE SURE YOU:  Understand these instructions.   Will watch your condition.   Will get help right away if you are not doing well or get worse.  Document Released: 08/30/2005 Document Revised: 11/25/2013 Document Reviewed: 07/30/2013 [facility] Patient Information 2015 [place], [place]. This information is not intended to replace advice given to you by your health care provider. Make sure you discuss any questions you have with your health care provider.

## 2015-07-22 NOTE — ED Provider Notes (Signed)
CSN: [id number]     Arrival date & time 07/22/15  1700 History   First MD Initiated Contact with Patient 07/22/15 1706     Chief Complaint  Patient presents with  . Chest Pain     (Consider location/radiation/quality/duration/timing/severity/associated sxs/prior Treatment) HPI Charles [name] is a 81 y.o. male with a history of Barrett's esophagus, GERD, hyperlipidemia, comes in for evaluation of epigastric discomfort. Patient states at approximately 1:30 this afternoon, after eating a full meal, he began to develop "a knot in my stomach". He characterizes the pain is excruciating and radiates downward into his abdomen. He denies any associated chest pain, shortness of breath, nausea or vomiting, numbness or weakness. He does report associated diaphoresis, but reports this is also baseline for him and he sweats "a lot". Patient reports he was seen at an urgent care facility at [facility] and was directed to come to the ED for further evaluation. Received 2 sublingual nitroglycerin at urgent care, but states this did not improve his discomfort. States while he has been in the ED, over the last 30 minutes his discomfort has started to resolve and he now rates it as a 3/10. No other aggravating or modifying factors. Per nursing note and he was given 324 mg ASA.  Past Medical History  Diagnosis Date  . GERD (gastroesophageal reflux disease)   . Cataract   . Depression   . Hyperlipidemia   . Osteoporosis   . Barrett's esophagus   . Lymphocytosis 06/2012  . Diverticulosis   . Vitamin D deficiency   . Diverticulosis   . BPH (benign prostatic hyperplasia)   . Murmur   . Constipation   . Arthritis   . Urinary retention with incomplete bladder emptying     pt uses bathroom and within 30 mins needs to go again  . Sleep apnea     wears CPAP  . CAD (coronary artery disease)     only on stress test; never had MI, PCI or CABG.  In 2008, LVEF was reportedly normal.   . AVM (arteriovenous  malformation)    Past Surgical History  Procedure Laterality Date  . Upper gastrointestinal endoscopy    . Repair spigelian hernia  2011  . Inguinal hernia repair      x4  . Back surgury      f  . Spinal fusion  1996  . Finger surgery Right     right thumb  . Colonoscopy    . Eye surgery Bilateral   . Back surgery  04/2013  . Cataract extraction    . Blepharoplasty Bilateral 02/23/15  . Vasectomy     Family History  Problem Relation Age of Onset  . Prostate cancer Father   . CAD Mother   . Arthritis-Osteo Brother    Social History  Substance Use Topics  . Smoking status: Never Smoker   . Smokeless tobacco: Never Used  . Alcohol Use: No    Review of Systems A 10 point review of systems was completed and was negative except for pertinent positives and negatives as mentioned in the history of present illness     Allergies  Celebrex  Home Medications   Prior to Admission medications   Medication Sig Start Date End Date Taking? Authorizing Provider  Acetaminophen 500 MG coapsule Take 1 capsule by mouth daily as needed for pain.    Yes Historical Provider, MD  aspirin EC 81 MG tablet Take 81 mg by mouth daily.   Yes Historical Provider,  MD  buPROPion (WELLBUTRIN SR) 150 MG 12 hr tablet Take 150 mg by mouth 2 (two) times daily.  06/28/11  Yes Historical Provider, MD  cholecalciferol (VITAMIN D) 1000 UNITS tablet Take 1,000 Units by mouth daily.   Yes Historical Provider, MD  cyclobenzaprine (FLEXERIL) 5 MG tablet Take 5 mg by mouth 3 (three) times daily as needed for muscle spasms.   Yes Historical Provider, MD  ferrous sulfate ([employer] IRON) 325 (65 FE) MG tablet Take 325 mg by mouth daily.   Yes Historical Provider, MD  fish oil-omega-3 fatty acids 1000 MG capsule Take 1 g by mouth daily.    Yes Historical Provider, MD  gabapentin (NEURONTIN) 300 MG capsule Take 600 mg by mouth daily.  09/03/14  Yes Historical Provider, MD  lovastatin (MEVACOR) 20 MG tablet Take 1 tablet by  mouth at bedtime. 07/22/12  Yes Historical Provider, MD  Multiple Minerals-Vitamins (CITRACAL PLUS BONE DENSITY) TABS Take 2 tablets by mouth daily.   Yes Historical Provider, MD  Multiple Vitamins-Minerals (CENTRUM SILVER ADULT 50+) TABS Take 1 tablet by mouth daily with breakfast.   Yes Historical Provider, MD  nabumetone (RELAFEN) 500 MG tablet Take 500 mg by mouth 2 (two) times daily as needed for mild pain.    Yes Historical Provider, MD  omeprazole (PRILOSEC) 40 MG capsule TAKE ONE CAPSULE BY MOUTH TWICE DAILY 04/07/15  Yes [name], MD  propranolol (INDERAL) 10 MG tablet Take 10 mg by mouth daily.    Yes Historical Provider, MD  ranitidine (ZANTAC) 300 MG tablet TAKE ONE TABLET BY MOUTH AT BEDTIME  09/21/14  Yes [name], MD  rOPINIRole (REQUIP) 1 MG tablet Take 1 mg by mouth at bedtime.   Yes Historical Provider, MD  Tamsulosin HCl (FLOMAX) 0.4 MG CAPS Take 0.4 mg by mouth daily.  08/30/11  Yes Historical Provider, MD  traMADol (ULTRAM) 50 MG tablet Take 100 mg by mouth 2 (two) times daily as needed for moderate pain.  08/23/11  Yes Historical Provider, MD  zoledronic acid (RECLAST) 5 MG/100ML SOLN injection Inject 5 mg into the vein once. Inject once a year last injection was on April 18th 2016   Yes Historical Provider, MD  hydrOXYzine (ATARAX/VISTARIL) 50 MG tablet Take 50 mg by mouth at bedtime as needed (sleep).  08/18/11   Historical Provider, MD   BP 129/54 mmHg  Pulse 54  Temp(Src) 98 F (36.7 C) (Oral)  Resp 22  SpO2 94% Physical Exam  Constitutional: He is oriented to person, place, and time. He appears well-developed and well-nourished.  HENT:  Head: Normocephalic and atraumatic.  Mouth/Throat: Oropharynx is clear and moist.  Eyes: Conjunctivae are normal. Pupils are equal, round, and reactive to light. Right eye exhibits no discharge. Left eye exhibits no discharge. No scleral icterus.  Neck: Neck supple.  Cardiovascular: Normal rate, regular rhythm and normal  heart sounds.   Pulmonary/Chest: Effort normal and breath sounds normal. No respiratory distress. He has no wheezes. He has no rales.  Abdominal: Soft. There is no tenderness.  Musculoskeletal: He exhibits no tenderness.  Neurological: He is alert and oriented to person, place, and time.  Cranial Nerves II-XII grossly intact  Skin: Skin is warm and dry. No rash noted.  Psychiatric: He has a normal mood and affect.  Nursing note and vitals reviewed.   ED Course  Procedures (including critical care time) Labs Review Labs Reviewed  BASIC METABOLIC PANEL - Abnormal; Notable for the following:    Glucose,  Bld 136 (*)    Calcium 8.8 (*)    All other components within normal limits  CBC - Abnormal; Notable for the following:    Platelets 134 (*)    All other components within normal limits  I-STAT TROPOININ, ED  Randolm Idol, ED    Imaging Review Dg Chest 2 View  07/22/2015   CLINICAL DATA:  Chest pain earlier today.  EXAM: CHEST  2 VIEW  COMPARISON:  04/09/2013  FINDINGS: Stable enlarged cardiac silhouette and mildly prominent pulmonary vasculature and interstitial markings. Small to moderate-sized hiatal hernia. Mild thoracic spine degenerative changes. Lumbar spine fixation hardware.  IMPRESSION: 1. No acute abnormality. 2. Stable cardiomegaly, mild pulmonary vascular congestion and mild chronic interstitial lung disease. 3. Small to moderate-sized hiatal hernia.   Electronically Signed   By: Claudie Revering M.D.   On: 07/22/2015 18:20   I have personally reviewed and evaluated these images and lab results as part of my medical decision-making.   EKG Interpretation None      ED ECG REPORT   Date: 07/23/2015  Rate: 85  Rhythm: normal sinus rhythm  QRS Axis: normal  Intervals: PR prolonged  ST/T Wave abnormalities: normal  Conduction Disutrbances:first-degree A-V block   Narrative Interpretation: New slightly prolonged PR interval.   Old EKG Reviewed: changes noted  I have  personally reviewed the EKG tracing and agree with the computerized printout as noted.  Meds given in ED:  Medications - No data to display  Discharge Medication List as of 07/22/2015 10:41 PM     Filed Vitals:   07/22/15 2115 07/22/15 2145 07/22/15 2215 07/22/15 2230  BP: 129/56 127/51 113/51 129/54  Pulse: 60 54 52 54  Temp:    98 F (36.7 C)  TempSrc:    Oral  Resp: 20 14 23 22   SpO2: 97% 99% 97% 94%    MDM  Vitals stable - WNL -afebrile Pt resting comfortably in ED. PE--physical exam as mentioned above and is not concerning Labwork---troponin is negative, EKG is reassuring. Labs otherwise noncontributory. Imaging--chest x-ray shows no acute abnormality.  DDX--patient with epigastric discomfort following a large meal. Patient with extensive GI history I feel pain today is likely related to GI.Marland Kitchen No discomfort in the ED, original discomfort not relieved with aspirin and nitroglycerin. Patient has established GI doc and will be able to follow-up this week for reevaluation. Low suspicion for cardiac or pulmonic etiology at this time. Patient feels well and is ready to go home. He is stable and appropriate for discharge. States he'll also follow up with his primary care doctor this week.  I discussed all relevant lab findings and imaging results with pt and they verbalized understanding. Discussed f/u with PCP within 48 hrs and return precautions, pt very amenable to plan. Prior to patient discharge, I discussed and reviewed this case with Dr. Tyrone Nine   Final diagnoses:  Epigastric discomfort        Comer Locket, PA-C 07/23/15 [place], DO 07/23/15 2238

## 2015-07-22 NOTE — ED Notes (Signed)
Pt verbalized understanding of d/c instructions and has no further qestions, NAD

## 2015-07-22 NOTE — Progress Notes (Signed)
Subjective:  Patient ID: Charles [name], male    DOB: [birth year]  Age: 81 y.o. MRN: [id number]  CC: Abdominal Pain   HPI Charles [name] presents  with a sudden onset of epigastric and lower chest pain he said that he ate lunch at the cafeteria at the facility that he lives in and developed a sudden onset of relating pain. He has no nausea or vomiting. Is nothing unusual that he ate. He has no stool change abdominal distention. No genitourinary symptoms. He denies any chest pain denies any radiation of the discomfort he is having. No shortness of breath or wheezing. He's had no improvement of the discomfort by taking Tums.  He has no cardiovascular history.  History [name] has a past medical history of GERD (gastroesophageal reflux disease); Cataract; Depression; Hyperlipidemia; Osteoporosis; Barrett's esophagus; Lymphocytosis (06/2012); Diverticulosis; Vitamin D deficiency; Diverticulosis; BPH (benign prostatic hyperplasia); Murmur; Constipation; Arthritis; Urinary retention with incomplete bladder emptying; Sleep apnea; CAD (coronary artery disease); and AVM (arteriovenous malformation).   He has past surgical history that includes Upper gastrointestinal endoscopy; Repair spigelian hernia (2011); Inguinal hernia repair; back surgury; Spinal fusion (1996); Finger surgery (Right); Colonoscopy; Eye surgery (Bilateral); Back surgery (04/2013); Cataract extraction; Blepharoplasty (Bilateral, 02/23/15); and Vasectomy.   His  family history includes Arthritis-Osteo in his brother; CAD in his mother; Prostate cancer in his father.  He   reports that he has never smoked. He has never used smokeless tobacco. He reports that he does not drink alcohol or use illicit drugs.  Outpatient Prescriptions Prior to Visit  Medication Sig Dispense Refill  . Acetaminophen 500 MG coapsule Take by mouth 2 (two) times daily.    Marland Kitchen aspirin EC 81 MG tablet Take 81 mg by mouth daily.    Marland Kitchen buPROPion (WELLBUTRIN  SR) 150 MG 12 hr tablet Take 150 mg by mouth 2 (two) times daily.     . cholecalciferol (VITAMIN D) 1000 UNITS tablet Take 1,000 Units by mouth daily.    . ferrous sulfate ([employer] IRON) 325 (65 FE) MG tablet Take 325 mg by mouth daily.    . fish oil-omega-3 fatty acids 1000 MG capsule Take 1 g by mouth daily.     Marland Kitchen gabapentin (NEURONTIN) 300 MG capsule     . hydrOXYzine (ATARAX/VISTARIL) 50 MG tablet Take 50 mg by mouth at bedtime as needed (sleep).     . lovastatin (MEVACOR) 20 MG tablet Take 1 tablet by mouth at bedtime.    . Multiple Minerals-Vitamins (CITRACAL PLUS BONE DENSITY) TABS Take 2 tablets by mouth daily.    . Multiple Vitamins-Minerals (CENTRUM SILVER ADULT 50+) TABS Take 1 tablet by mouth daily with breakfast.    . nabumetone (RELAFEN) 500 MG tablet Take 500 mg by mouth 2 (two) times daily.    Marland Kitchen omeprazole (PRILOSEC) 40 MG capsule TAKE ONE CAPSULE BY MOUTH TWICE DAILY 180 capsule 1  . propranolol (INDERAL) 10 MG tablet Take 10 mg by mouth daily as needed.    . ranitidine (ZANTAC) 300 MG tablet TAKE ONE TABLET BY MOUTH AT BEDTIME  90 tablet 3  . rOPINIRole (REQUIP) 1 MG tablet Take 1 mg by mouth at bedtime.    . senna (SENOKOT) 8.6 MG tablet Take 1 tablet by mouth daily.    . Tamsulosin HCl (FLOMAX) 0.4 MG CAPS Take 0.4 mg by mouth daily.     . traMADol (ULTRAM) 50 MG tablet Take 100 mg by mouth 2 (two) times daily as needed for pain.     Marland Kitchen  zoledronic acid (RECLAST) 5 MG/100ML SOLN injection Inject 5 mg into the vein once. Start date 01/22/2013     No facility-administered medications prior to visit.    Social History   Social History  . Marital Status: Married    Spouse Name: [name]  . Number of Children: 3  . Years of Education: College   Occupational History  . Retired     retired Arboriculturist   Social History Main Topics  . Smoking status: Never Smoker   . Smokeless tobacco: Never Used  . Alcohol Use: No  . Drug Use: No  . Sexual Activity: Not Asked   Other Topics  Concern  . None   Social History Narrative   Patient is married [name]).   Patient is a retired Arboriculturist.   Patient lives in [place] living facility.   Patient has three adult children.   Patient does not drink any caffeine.   Patient is right-handed.     Review of Systems  Constitutional: Negative for fever, chills and appetite change.  HENT: Negative for congestion, ear pain, postnasal drip, sinus pressure and sore throat.   Eyes: Negative for pain and redness.  Respiratory: Negative for cough, shortness of breath and wheezing.   Cardiovascular: Negative for leg swelling.  Gastrointestinal: Positive for abdominal pain. Negative for nausea, vomiting, diarrhea, constipation and blood in stool.  Endocrine: Negative for polyuria.  Genitourinary: Negative for dysuria, urgency, frequency and flank pain.  Musculoskeletal: Negative for gait problem.  Skin: Negative for rash.  Neurological: Negative for weakness and headaches.  Psychiatric/Behavioral: Negative for confusion and decreased concentration. The patient is not nervous/anxious.     Objective:  BP 152/70 mmHg  Pulse 53  Temp(Src) 97.8 F (36.6 C) (Oral)  Resp 18  Ht 5\' 7"  (1.702 m)  Wt 176 lb (79.833 kg)  BMI 27.56 kg/m2  SpO2 98%  Physical Exam  Constitutional: He is oriented to person, place, and time. He appears well-developed and well-nourished. No distress.  HENT:  Head: Normocephalic and atraumatic.  Right Ear: External ear normal.  Left Ear: External ear normal.  Nose: Nose normal.  Eyes: Conjunctivae and EOM are normal. Pupils are equal, round, and reactive to light. No scleral icterus.  Neck: Normal range of motion. Neck supple. No tracheal deviation present.  Cardiovascular: Normal rate, regular rhythm and normal heart sounds.   Pulmonary/Chest: Effort normal. No respiratory distress. He has no wheezes. He has no rales.  Abdominal: He exhibits no mass. There is no tenderness. There is no rebound and  no guarding.  Musculoskeletal: He exhibits no edema.  Lymphadenopathy:    He has no cervical adenopathy.  Neurological: He is alert and oriented to person, place, and time. Coordination normal.  Skin: No rash noted. He is diaphoretic.  Psychiatric: He has a normal mood and affect. His behavior is normal.   He is very ashen appearing and diaphoretic.   Assessment & Plan:   Karee was seen today for abdominal pain.  Diagnoses and all orders for this visit:  Chest pain, unspecified chest pain type -     EKG 12-Lead -     Cancel: DG Abd Acute W/Chest -     Cancel: POCT CBC  Abdominal pain, epigastric -     Cancel: DG Abd Acute W/Chest -     Cancel: POCT CBC   I am having Mr. Knaggs maintain his buPROPion, hydrOXYzine, tamsulosin, traMADol, fish oil-omega-3 fatty acids, lovastatin, aspirin EC, cholecalciferol, nabumetone, senna, rOPINIRole, propranolol, CITRACAL PLUS  BONE DENSITY, CENTRUM SILVER ADULT 50+, zoledronic acid, ferrous sulfate, ranitidine, gabapentin, Acetaminophen, and omeprazole.  No orders of the defined types were placed in this encounter.   To ER via EMS Concerns include chest pain from a cardiac issue perforated ulcer or duodenal ulcer.  Appropriate red flag conditions were discussed with the patient as well as actions that should be taken.  Patient expressed his understanding.  Follow-up: No Follow-up on file.  Roselee Culver, MD

## 2015-07-23 DIAGNOSIS — E785 Hyperlipidemia, unspecified: Secondary | ICD-10-CM | POA: Diagnosis not present

## 2015-07-23 DIAGNOSIS — E559 Vitamin D deficiency, unspecified: Secondary | ICD-10-CM | POA: Diagnosis not present

## 2015-07-23 DIAGNOSIS — R739 Hyperglycemia, unspecified: Secondary | ICD-10-CM | POA: Diagnosis not present

## 2015-07-27 DIAGNOSIS — Z125 Encounter for screening for malignant neoplasm of prostate: Secondary | ICD-10-CM | POA: Diagnosis not present

## 2015-07-30 DIAGNOSIS — Z1389 Encounter for screening for other disorder: Secondary | ICD-10-CM | POA: Diagnosis not present

## 2015-07-30 DIAGNOSIS — Z23 Encounter for immunization: Secondary | ICD-10-CM | POA: Diagnosis not present

## 2015-07-30 DIAGNOSIS — E559 Vitamin D deficiency, unspecified: Secondary | ICD-10-CM | POA: Diagnosis not present

## 2015-07-30 DIAGNOSIS — K449 Diaphragmatic hernia without obstruction or gangrene: Secondary | ICD-10-CM | POA: Diagnosis not present

## 2015-07-30 DIAGNOSIS — Z0000 Encounter for general adult medical examination without abnormal findings: Secondary | ICD-10-CM | POA: Diagnosis not present

## 2015-07-30 DIAGNOSIS — F329 Major depressive disorder, single episode, unspecified: Secondary | ICD-10-CM | POA: Diagnosis not present

## 2015-07-30 DIAGNOSIS — D649 Anemia, unspecified: Secondary | ICD-10-CM | POA: Diagnosis not present

## 2015-07-30 DIAGNOSIS — G2581 Restless legs syndrome: Secondary | ICD-10-CM | POA: Diagnosis not present

## 2015-07-30 DIAGNOSIS — I2510 Atherosclerotic heart disease of native coronary artery without angina pectoris: Secondary | ICD-10-CM | POA: Diagnosis not present

## 2015-07-30 DIAGNOSIS — M810 Age-related osteoporosis without current pathological fracture: Secondary | ICD-10-CM | POA: Diagnosis not present

## 2015-07-30 DIAGNOSIS — R740 Nonspecific elevation of levels of transaminase and lactic acid dehydrogenase [LDH]: Secondary | ICD-10-CM | POA: Diagnosis not present

## 2015-07-30 DIAGNOSIS — G4733 Obstructive sleep apnea (adult) (pediatric): Secondary | ICD-10-CM | POA: Diagnosis not present

## 2015-07-30 DIAGNOSIS — Z6827 Body mass index (BMI) 27.0-27.9, adult: Secondary | ICD-10-CM | POA: Diagnosis not present

## 2015-07-30 DIAGNOSIS — E785 Hyperlipidemia, unspecified: Secondary | ICD-10-CM | POA: Diagnosis not present

## 2015-08-02 DIAGNOSIS — Z1212 Encounter for screening for malignant neoplasm of rectum: Secondary | ICD-10-CM | POA: Diagnosis not present

## 2015-08-04 DIAGNOSIS — M545 Low back pain: Secondary | ICD-10-CM | POA: Diagnosis not present

## 2015-08-04 DIAGNOSIS — M542 Cervicalgia: Secondary | ICD-10-CM | POA: Diagnosis not present

## 2015-08-04 DIAGNOSIS — M5136 Other intervertebral disc degeneration, lumbar region: Secondary | ICD-10-CM | POA: Diagnosis not present

## 2015-08-04 DIAGNOSIS — M533 Sacrococcygeal disorders, not elsewhere classified: Secondary | ICD-10-CM | POA: Diagnosis not present

## 2015-08-05 ENCOUNTER — Other Ambulatory Visit: Payer: Self-pay | Admitting: Internal Medicine

## 2015-08-05 DIAGNOSIS — K449 Diaphragmatic hernia without obstruction or gangrene: Secondary | ICD-10-CM

## 2015-08-10 ENCOUNTER — Ambulatory Visit
Admission: RE | Admit: 2015-08-10 | Discharge: 2015-08-10 | Disposition: A | Discharge status: Home / Self Care | Payer: Medicare Other | Source: Ambulatory Visit | Attending: Internal Medicine | Admitting: Internal Medicine

## 2015-08-10 ENCOUNTER — Other Ambulatory Visit: Payer: Self-pay | Admitting: Internal Medicine

## 2015-08-10 DIAGNOSIS — K449 Diaphragmatic hernia without obstruction or gangrene: Secondary | ICD-10-CM | POA: Diagnosis not present

## 2015-08-12 DIAGNOSIS — R740 Nonspecific elevation of levels of transaminase and lactic acid dehydrogenase [LDH]: Secondary | ICD-10-CM | POA: Diagnosis not present

## 2015-08-23 ENCOUNTER — Other Ambulatory Visit: Payer: Self-pay | Admitting: Gastroenterology

## 2015-08-23 DIAGNOSIS — R202 Paresthesia of skin: Secondary | ICD-10-CM | POA: Diagnosis not present

## 2015-08-27 DIAGNOSIS — M5136 Other intervertebral disc degeneration, lumbar region: Secondary | ICD-10-CM | POA: Diagnosis not present

## 2015-08-30 ENCOUNTER — Other Ambulatory Visit: Payer: Self-pay | Admitting: Specialist

## 2015-08-30 DIAGNOSIS — G8929 Other chronic pain: Secondary | ICD-10-CM

## 2015-08-30 DIAGNOSIS — M545 Low back pain: Principal | ICD-10-CM

## 2015-09-07 ENCOUNTER — Ambulatory Visit
Admission: RE | Admit: 2015-09-07 | Discharge: 2015-09-07 | Disposition: A | Discharge status: Home / Self Care | Payer: Medicare Other | Source: Ambulatory Visit | Attending: Specialist | Admitting: Specialist

## 2015-09-07 VITALS — BP 149/66 | HR 55

## 2015-09-07 DIAGNOSIS — M4316 Spondylolisthesis, lumbar region: Secondary | ICD-10-CM

## 2015-09-07 DIAGNOSIS — G8929 Other chronic pain: Secondary | ICD-10-CM

## 2015-09-07 DIAGNOSIS — M545 Low back pain, unspecified: Secondary | ICD-10-CM

## 2015-09-07 DIAGNOSIS — M47817 Spondylosis without myelopathy or radiculopathy, lumbosacral region: Secondary | ICD-10-CM

## 2015-09-07 DIAGNOSIS — M5126 Other intervertebral disc displacement, lumbar region: Secondary | ICD-10-CM | POA: Diagnosis not present

## 2015-09-07 DIAGNOSIS — M5136 Other intervertebral disc degeneration, lumbar region: Secondary | ICD-10-CM | POA: Diagnosis not present

## 2015-09-07 MED ORDER — ONDANSETRON HCL 4 MG/2ML IJ SOLN
4.0000 mg | Freq: Once | INTRAMUSCULAR | Status: AC
  Administered 2015-09-07: 4 mg via INTRAMUSCULAR

## 2015-09-07 MED ORDER — MEPERIDINE HCL 100 MG/ML IJ SOLN
75.0000 mg | Freq: Once | INTRAMUSCULAR | Status: AC
  Administered 2015-09-07: 50 mg via INTRAMUSCULAR

## 2015-09-07 MED ORDER — IOHEXOL 180 MG/ML  SOLN
15.0000 mL | Freq: Once | INTRAMUSCULAR | Status: DC | PRN
  Administered 2015-09-07: 15 mL via INTRATHECAL

## 2015-09-07 NOTE — Progress Notes (Signed)
Patient states he has been off Wellbutrin, Relafen and Tramadol for at least the past two days.

## 2015-09-07 NOTE — Discharge Instructions (Signed)
Myelogram Discharge Instructions  1. Go home and rest quietly for the next 24 hours.  It is important to lie flat for the next 24 hours.  Get up only to go to the restroom.  You may lie in the bed or on a couch on your back, your stomach, your left side or your right side.  You may have one pillow under your head.  You may have pillows between your knees while you are on your side or under your knees while you are on your back.  2. DO NOT drive today.  Recline the seat as far back as it will go, while still wearing your seat belt, on the way home.  3. You may get up to go to the bathroom as needed.  You may sit up for 10 minutes to eat.  You may resume your normal diet and medications unless otherwise indicated.  Drink lots of extra fluids today and tomorrow.  4. The incidence of headache, nausea, or vomiting is about 5% (one in 20 patients).  If you develop a headache, lie flat and drink plenty of fluids until the headache goes away.  Caffeinated beverages may be helpful.  If you develop severe nausea and vomiting or a headache that does not go away with flat bed rest, call [phone].  5. You may resume normal activities after your 24 hours of bed rest is over; however, do not exert yourself strongly or do any heavy lifting tomorrow. If when you get up you have a headache when standing, go back to bed and force fluids for another 24 hours.  6. Call your physician for a follow-up appointment.  The results of your myelogram will be sent directly to your physician by the following day.  7. If you have any questions or if complications develop after you arrive home, please call [phone].  Discharge instructions have been explained to the patient.  The patient, or the person responsible for the patient, fully understands these instructions.        May resume Wellbutrin, Relafen and Tramadol on Oct. 5, 2016, after 8:30 am.

## 2015-09-09 DIAGNOSIS — M5136 Other intervertebral disc degeneration, lumbar region: Secondary | ICD-10-CM | POA: Diagnosis not present

## 2015-09-10 ENCOUNTER — Other Ambulatory Visit: Payer: Self-pay | Admitting: Specialist

## 2015-09-10 DIAGNOSIS — M545 Low back pain: Secondary | ICD-10-CM

## 2015-09-21 DIAGNOSIS — N400 Enlarged prostate without lower urinary tract symptoms: Secondary | ICD-10-CM | POA: Diagnosis not present

## 2015-09-30 DIAGNOSIS — N400 Enlarged prostate without lower urinary tract symptoms: Secondary | ICD-10-CM | POA: Diagnosis not present

## 2015-10-01 ENCOUNTER — Ambulatory Visit
Admission: RE | Admit: 2015-10-01 | Discharge: 2015-10-01 | Disposition: A | Discharge status: Home / Self Care | Payer: Medicare Other | Source: Ambulatory Visit | Attending: Specialist | Admitting: Specialist

## 2015-10-01 DIAGNOSIS — M5126 Other intervertebral disc displacement, lumbar region: Secondary | ICD-10-CM | POA: Diagnosis not present

## 2015-10-01 DIAGNOSIS — M545 Low back pain: Secondary | ICD-10-CM

## 2015-10-01 MED ORDER — GADOBENATE DIMEGLUMINE 529 MG/ML IV SOLN
15.0000 mL | Freq: Once | INTRAVENOUS | Status: AC | PRN
  Administered 2015-10-01: 15 mL via INTRAVENOUS

## 2015-10-06 DIAGNOSIS — M5412 Radiculopathy, cervical region: Secondary | ICD-10-CM | POA: Diagnosis not present

## 2015-10-07 ENCOUNTER — Other Ambulatory Visit: Payer: Self-pay | Admitting: Gastroenterology

## 2015-10-11 ENCOUNTER — Encounter: Payer: Self-pay | Admitting: Gastroenterology

## 2015-10-15 ENCOUNTER — Ambulatory Visit (INDEPENDENT_AMBULATORY_CARE_PROVIDER_SITE_OTHER): Payer: Medicare Other | Admitting: Gastroenterology

## 2015-10-15 ENCOUNTER — Encounter: Payer: Self-pay | Admitting: Gastroenterology

## 2015-10-15 VITALS — BP 116/62 | HR 60 | Ht 66.5 in | Wt 180.0 lb

## 2015-10-15 DIAGNOSIS — Q2733 Arteriovenous malformation of digestive system vessel: Secondary | ICD-10-CM

## 2015-10-15 DIAGNOSIS — K2270 Barrett's esophagus without dysplasia: Secondary | ICD-10-CM | POA: Diagnosis not present

## 2015-10-15 DIAGNOSIS — K5520 Angiodysplasia of colon without hemorrhage: Secondary | ICD-10-CM

## 2015-10-15 MED ORDER — RANITIDINE HCL 300 MG PO TABS: 300.0000 mg | ORAL_TABLET | Freq: Every day | ORAL | Status: DC

## 2015-10-15 MED ORDER — OMEPRAZOLE 40 MG PO CPDR: 40.0000 mg | DELAYED_RELEASE_CAPSULE | Freq: Two times a day (BID) | ORAL | Status: DC

## 2015-10-15 NOTE — Progress Notes (Signed)
    History of Present Illness: This is an 81 year old male accompanied by his wife. He was evaluated in the emergency department for chest pain and epigastric pain in August. His symptoms resolved that day without a clear etiology however a gastrointestinal etiology was suspected by the ED. He has had no gastrointestinal complaints since then. He was seen in follow-up by Dr. Bevelyn Buckles following the ED visit. Barium esophagram performed with results below. The patient also states he had abdominal ultrasound which showed fatty liver and no other abnormalities however I'm unable to locate this report.  Barium esophagram 08/2015 IMPRESSION: 1. Small paraesophageal hiatal hernia with mild gastroesophageal reflux. Barium pill passes into the stomach without delay. 2. Faint aspiration with appropriate cough response. 3. Significant tertiary contractions in the mid and distal esophagus.   Current Medications, Allergies, Past Medical History, Past Surgical History, Family History and Social History were reviewed in [employer] record.  Physical Exam: General: Well developed, well nourished, no acute distress Head: Normocephalic and atraumatic Eyes:  sclerae anicteric, EOMI Ears: Normal auditory acuity Mouth: No deformity or lesions Lungs: Clear throughout to auscultation Heart: Regular rate and rhythm; no murmurs, rubs or bruits Abdomen: Soft, non tender and non distended. No masses, hepatosplenomegaly or hernias noted. Normal Bowel sounds Musculoskeletal: Symmetrical with no gross deformities  Pulses:  Normal pulses noted Extremities: No clubbing, cyanosis, edema or deformities noted Neurological: Alert oriented x 4, grossly nonfocal Psychological:  Alert and cooperative. Normal mood and affect  Assessment and Recommendations:  1. Barrett's esophagus. Reflux symptoms well controlled. BA esophagram: Small paraesophageal hernia, faint aspiration with cough response  noted and tertiary esophageal contractions noted. Continue antireflux measures, omeprazole 40 mg twice daily and ranitidine 300 mg at bedtime. REV in 1 year.   2. 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 Ongoing monitoring by Dr. Bevelyn Buckles.  I spent 15 minutes of face-to-face time with the patient. Greater than 50% of the time was spent counseling and coordinating care.

## 2015-10-15 NOTE — Patient Instructions (Signed)
We have sent the following medications to your pharmacy for you to pick up at your convenience:Ranitidine and omeprazole.   Thank you for choosing me and [facility].  Pricilla Riffle. Dagoberto Ligas., MD., Marval Regal

## 2015-10-18 DIAGNOSIS — M5412 Radiculopathy, cervical region: Secondary | ICD-10-CM | POA: Diagnosis not present

## 2015-10-18 DIAGNOSIS — M5416 Radiculopathy, lumbar region: Secondary | ICD-10-CM | POA: Diagnosis not present

## 2015-11-05 DIAGNOSIS — M4806 Spinal stenosis, lumbar region: Secondary | ICD-10-CM | POA: Diagnosis not present

## 2015-11-12 DIAGNOSIS — M545 Low back pain: Secondary | ICD-10-CM | POA: Diagnosis not present

## 2015-11-12 DIAGNOSIS — M79604 Pain in right leg: Secondary | ICD-10-CM | POA: Diagnosis not present

## 2015-11-12 DIAGNOSIS — R2681 Unsteadiness on feet: Secondary | ICD-10-CM | POA: Diagnosis not present

## 2015-11-12 DIAGNOSIS — M6281 Muscle weakness (generalized): Secondary | ICD-10-CM | POA: Diagnosis not present

## 2015-11-16 DIAGNOSIS — R2681 Unsteadiness on feet: Secondary | ICD-10-CM | POA: Diagnosis not present

## 2015-11-16 DIAGNOSIS — M6281 Muscle weakness (generalized): Secondary | ICD-10-CM | POA: Diagnosis not present

## 2015-11-16 DIAGNOSIS — M79604 Pain in right leg: Secondary | ICD-10-CM | POA: Diagnosis not present

## 2015-11-16 DIAGNOSIS — M545 Low back pain: Secondary | ICD-10-CM | POA: Diagnosis not present

## 2015-11-18 DIAGNOSIS — M545 Low back pain: Secondary | ICD-10-CM | POA: Diagnosis not present

## 2015-11-18 DIAGNOSIS — R2681 Unsteadiness on feet: Secondary | ICD-10-CM | POA: Diagnosis not present

## 2015-11-18 DIAGNOSIS — M79604 Pain in right leg: Secondary | ICD-10-CM | POA: Diagnosis not present

## 2015-11-18 DIAGNOSIS — M6281 Muscle weakness (generalized): Secondary | ICD-10-CM | POA: Diagnosis not present

## 2015-11-22 DIAGNOSIS — R2681 Unsteadiness on feet: Secondary | ICD-10-CM | POA: Diagnosis not present

## 2015-11-22 DIAGNOSIS — M79604 Pain in right leg: Secondary | ICD-10-CM | POA: Diagnosis not present

## 2015-11-22 DIAGNOSIS — M6281 Muscle weakness (generalized): Secondary | ICD-10-CM | POA: Diagnosis not present

## 2015-11-22 DIAGNOSIS — M545 Low back pain: Secondary | ICD-10-CM | POA: Diagnosis not present

## 2015-11-24 DIAGNOSIS — M79604 Pain in right leg: Secondary | ICD-10-CM | POA: Diagnosis not present

## 2015-11-24 DIAGNOSIS — R2681 Unsteadiness on feet: Secondary | ICD-10-CM | POA: Diagnosis not present

## 2015-11-24 DIAGNOSIS — M6281 Muscle weakness (generalized): Secondary | ICD-10-CM | POA: Diagnosis not present

## 2015-11-24 DIAGNOSIS — M545 Low back pain: Secondary | ICD-10-CM | POA: Diagnosis not present

## 2015-11-26 DIAGNOSIS — R2681 Unsteadiness on feet: Secondary | ICD-10-CM | POA: Diagnosis not present

## 2015-11-26 DIAGNOSIS — M6281 Muscle weakness (generalized): Secondary | ICD-10-CM | POA: Diagnosis not present

## 2015-11-26 DIAGNOSIS — M545 Low back pain: Secondary | ICD-10-CM | POA: Diagnosis not present

## 2015-11-26 DIAGNOSIS — M79604 Pain in right leg: Secondary | ICD-10-CM | POA: Diagnosis not present

## 2015-11-29 DIAGNOSIS — M545 Low back pain: Secondary | ICD-10-CM | POA: Diagnosis not present

## 2015-11-29 DIAGNOSIS — M79604 Pain in right leg: Secondary | ICD-10-CM | POA: Diagnosis not present

## 2015-11-29 DIAGNOSIS — R2681 Unsteadiness on feet: Secondary | ICD-10-CM | POA: Diagnosis not present

## 2015-11-29 DIAGNOSIS — M6281 Muscle weakness (generalized): Secondary | ICD-10-CM | POA: Diagnosis not present

## 2015-12-01 DIAGNOSIS — M6281 Muscle weakness (generalized): Secondary | ICD-10-CM | POA: Diagnosis not present

## 2015-12-01 DIAGNOSIS — M79604 Pain in right leg: Secondary | ICD-10-CM | POA: Diagnosis not present

## 2015-12-01 DIAGNOSIS — R2681 Unsteadiness on feet: Secondary | ICD-10-CM | POA: Diagnosis not present

## 2015-12-01 DIAGNOSIS — M545 Low back pain: Secondary | ICD-10-CM | POA: Diagnosis not present

## 2015-12-07 DIAGNOSIS — M79604 Pain in right leg: Secondary | ICD-10-CM | POA: Diagnosis not present

## 2015-12-07 DIAGNOSIS — M6281 Muscle weakness (generalized): Secondary | ICD-10-CM | POA: Diagnosis not present

## 2015-12-07 DIAGNOSIS — M545 Low back pain: Secondary | ICD-10-CM | POA: Diagnosis not present

## 2015-12-07 DIAGNOSIS — R2681 Unsteadiness on feet: Secondary | ICD-10-CM | POA: Diagnosis not present

## 2015-12-09 DIAGNOSIS — M4806 Spinal stenosis, lumbar region: Secondary | ICD-10-CM | POA: Diagnosis not present

## 2015-12-09 DIAGNOSIS — M5412 Radiculopathy, cervical region: Secondary | ICD-10-CM | POA: Diagnosis not present

## 2015-12-10 DIAGNOSIS — M545 Low back pain: Secondary | ICD-10-CM | POA: Diagnosis not present

## 2015-12-10 DIAGNOSIS — M6281 Muscle weakness (generalized): Secondary | ICD-10-CM | POA: Diagnosis not present

## 2015-12-10 DIAGNOSIS — R2681 Unsteadiness on feet: Secondary | ICD-10-CM | POA: Diagnosis not present

## 2015-12-10 DIAGNOSIS — M79604 Pain in right leg: Secondary | ICD-10-CM | POA: Diagnosis not present

## 2015-12-12 ENCOUNTER — Telehealth: Payer: Self-pay

## 2015-12-12 NOTE — Telephone Encounter (Signed)
Called pt to advise them that the office will be closed on 12/13/15. Left pt a message on the home number listed telling him that the office is closed on 12/13/15 and to please call us back on Tuesday to reschedule.

## 2015-12-13 ENCOUNTER — Ambulatory Visit: Payer: Medicare Other | Admitting: Neurology

## 2015-12-13 NOTE — Telephone Encounter (Signed)
Called pt again to r/s the 1/9 appt, no answer, already left a message yesterday.

## 2015-12-14 DIAGNOSIS — R2681 Unsteadiness on feet: Secondary | ICD-10-CM | POA: Diagnosis not present

## 2015-12-14 DIAGNOSIS — M6281 Muscle weakness (generalized): Secondary | ICD-10-CM | POA: Diagnosis not present

## 2015-12-14 DIAGNOSIS — M79604 Pain in right leg: Secondary | ICD-10-CM | POA: Diagnosis not present

## 2015-12-14 DIAGNOSIS — M545 Low back pain: Secondary | ICD-10-CM | POA: Diagnosis not present

## 2015-12-17 DIAGNOSIS — M79604 Pain in right leg: Secondary | ICD-10-CM | POA: Diagnosis not present

## 2015-12-17 DIAGNOSIS — M6281 Muscle weakness (generalized): Secondary | ICD-10-CM | POA: Diagnosis not present

## 2015-12-17 DIAGNOSIS — R2681 Unsteadiness on feet: Secondary | ICD-10-CM | POA: Diagnosis not present

## 2015-12-17 DIAGNOSIS — M545 Low back pain: Secondary | ICD-10-CM | POA: Diagnosis not present

## 2015-12-20 DIAGNOSIS — D1801 Hemangioma of skin and subcutaneous tissue: Secondary | ICD-10-CM | POA: Diagnosis not present

## 2015-12-20 DIAGNOSIS — D225 Melanocytic nevi of trunk: Secondary | ICD-10-CM | POA: Diagnosis not present

## 2015-12-20 DIAGNOSIS — B351 Tinea unguium: Secondary | ICD-10-CM | POA: Diagnosis not present

## 2015-12-20 DIAGNOSIS — L570 Actinic keratosis: Secondary | ICD-10-CM | POA: Diagnosis not present

## 2015-12-20 DIAGNOSIS — L821 Other seborrheic keratosis: Secondary | ICD-10-CM | POA: Diagnosis not present

## 2015-12-20 DIAGNOSIS — L812 Freckles: Secondary | ICD-10-CM | POA: Diagnosis not present

## 2015-12-21 ENCOUNTER — Ambulatory Visit (INDEPENDENT_AMBULATORY_CARE_PROVIDER_SITE_OTHER): Payer: Medicare Other | Admitting: Neurology

## 2015-12-21 ENCOUNTER — Encounter: Payer: Self-pay | Admitting: Neurology

## 2015-12-21 VITALS — BP 138/68 | HR 72 | Resp 20 | Ht 66.5 in | Wt 182.0 lb

## 2015-12-21 DIAGNOSIS — M545 Low back pain: Secondary | ICD-10-CM | POA: Diagnosis not present

## 2015-12-21 DIAGNOSIS — G4733 Obstructive sleep apnea (adult) (pediatric): Secondary | ICD-10-CM

## 2015-12-21 DIAGNOSIS — R2681 Unsteadiness on feet: Secondary | ICD-10-CM | POA: Diagnosis not present

## 2015-12-21 DIAGNOSIS — M6281 Muscle weakness (generalized): Secondary | ICD-10-CM | POA: Diagnosis not present

## 2015-12-21 DIAGNOSIS — G4701 Insomnia due to medical condition: Secondary | ICD-10-CM | POA: Diagnosis not present

## 2015-12-21 DIAGNOSIS — G2581 Restless legs syndrome: Secondary | ICD-10-CM | POA: Diagnosis not present

## 2015-12-21 DIAGNOSIS — G250 Essential tremor: Secondary | ICD-10-CM | POA: Diagnosis not present

## 2015-12-21 DIAGNOSIS — Z9989 Dependence on other enabling machines and devices: Secondary | ICD-10-CM

## 2015-12-21 DIAGNOSIS — M79604 Pain in right leg: Secondary | ICD-10-CM | POA: Diagnosis not present

## 2015-12-21 NOTE — Progress Notes (Signed)
[facility]  Provider:  Larey Seat, M D  Referring Provider: Leanna Battles, MD Primary Care Physician:  Donnajean Lopes, MD  Chief Complaint  Patient presents with  . Follow-up    cpap, RLS, having back problems, rm 11, alone    HPI:  Charles [name] is a 82 y.o. , right handed , caucasian ,a  retired Arboriculturist . He was seen on 12-11-14 , after referral from Dr. Philip Aspen for a sleep apnea Re- evaluation;  Charles [name] recalls being tested for sleep apnea about 12 years ago. It was his primary care physician Dr. Philip Aspen who referred him for the sleep test at the time to [facility], where received the diagnosis of a the sleep apnea and was prescribed a CPAP machine. Currently his durable medical equipment company is [employer]. He brought his machine here but unfortunately it does not allow to read the [employer] memory chip. He received a new machine just March 19th. 2015 by Dr. Philip Aspen as the prescribing physician.  He feels that his machine works fine for him but is unaware of the current settings. He wears a  Lexicographer Full face mask. He just got that new , too.  He was not required to undergo a new sleep test to qualify ? Medicare approval usually depends on a re evaluation.  [name].reports a bed time between 10.30 to 11 PM and falls asleep promptly. He has no TV in the bedroom and watches in the den before going to bed. He sleeps often while the TV is runnig, his wife reports. His father was the same well. He will have 2 nocturia  breaks, since using CPAP 12 years ago. He has been using hydroxyzine as a sleep aid every night, 50 mg.  He wakes up feeling refreshed, after 7 hours of sleep. He rises at 6.30, uses an alarm . He drinks no caffeine.  No ETOH and no tobacco use reported.  He is falling asleep here reporting his medical history.... It appears , that Charles [name] has a resting tremor and los of facial mimic as well as hypophonia.   His wife reports that he has been yelling and thrashing in his sleep, but falls back asleep within minutes, doesn't leave the bed.   January 8 th 2016 :  Charles [name] was seen in every visit within 6 weeks after his initial consultation last year by my nurse practitioner Charlott Holler. He is here today for follow-up on CPAP compliance. Mr. Freudenberg now uses CPAP at 8 cm water with full-time EPR of 1 cm his average time of use for CPAP is 7 hours and 11 minutes and he has a 97% compliance for 30 out of 30 days and 20 a days of over 4 hours continued use. His AHI was 10.8 which is rather high. It is related to any air leaks as I can see be obtained the tracing today here in office 12-11-14 and the patient has 3.7 obstructive residual apneas per hour of sleep. He has never used an hour total titrated before but his machine can be used as such. He would not need to obtain a different machine-  just a temporary change in settings.  I would like for him to use the CPAP between 6 at 10 cm water continue with heated humidity and with his current mask as his air leaks are low. DME is [employer] .  The patient endorsed fatigue severity score at 30 points and the  Epworth sleepiness score at 8 points and the geriatric depression score at 4 points.    Interval history from 12/21/2015. Charles [name] is seen here today for compliance visit ; he is highly compliant with 100% over 30 days of daily hours of use. 7 hours of 25 minutes is his average daily user time, the machine is an AutoSet between 6 and 10 cm  H20, with 1 cm EPR, and  his residual AHI is 5.1 which is sufficient. He is highly compliant endorsed today the geriatric depression score at 0 points. The [facility] restless leg syndrome quality of life questionnaire was endorsed at a moderate impairment over the last 4 weeks he felt quite distressed at times by restless legs and it involved changing stool his evening routine, a few times it kept him from  attending social events and he states that he is concentrating in the evening which may be a side effect of restless legs. The RLS 6 rating scale was endorsed at 9 to very mild impairment.  Restless legs only bother him when he is trying to fall asleep or when he sits for longer times at rest.  The Epworth sleepiness score was endorsed at 4 points the fatigue severity score at 16 points. He is not in any way bothered by his mild tremor which is symmetrically appearing in both hands and is not very evident at rest but mostly with action. he does not have other Parkinson symptoms. He reports having been evaluated by orthopedist for right leg numbness, which seems to fit an L4 nerve distribution ( attenuation of the patella reflex).   Review of Systems: Out of a complete 14 system review, the patient complains of only the following symptoms, and all other reviewed systems are negative. On CPAP, highly compliant, interogated the machine. 8 cm water. RLS causing insomnia.     Social History   Social History  . Marital Status: Married    Spouse Name: [name]  . Number of Children: 3  . Years of Education: College   Occupational History  . Retired     retired Arboriculturist   Social History Main Topics  . Smoking status: Never Smoker   . Smokeless tobacco: Never Used  . Alcohol Use: No  . Drug Use: No  . Sexual Activity: Not on file   Other Topics Concern  . Not on file   Social History Narrative   Patient is married [name]).   Patient is a retired Arboriculturist.   Patient lives in [place] living facility.   Patient has three adult children.   Patient does not drink any caffeine.   Patient is right-handed.    Family History  Problem Relation Age of Onset  . Prostate cancer Father   . CAD Mother   . Arthritis-Osteo Brother     Past Medical History  Diagnosis Date  . GERD (gastroesophageal reflux disease)   . Cataract   . Depression   . Hyperlipidemia   . Osteoporosis   .  Barrett's esophagus   . Lymphocytosis 06/2012  . Diverticulosis   . Vitamin D deficiency   . Diverticulosis   . BPH (benign prostatic hyperplasia)   . Murmur   . Constipation   . Arthritis   . Urinary retention with incomplete bladder emptying     pt uses bathroom and within 30 mins needs to go again  . Sleep apnea     wears CPAP  . CAD (coronary artery disease)  only on stress test; never had MI, PCI or CABG.  In 2008, LVEF was reportedly normal.   . AVM (arteriovenous malformation)     Past Surgical History  Procedure Laterality Date  . Upper gastrointestinal endoscopy    . Repair spigelian hernia  2011  . Inguinal hernia repair      x4  . Back surgury      f  . Spinal fusion  1996  . Finger surgery Right     right thumb  . Colonoscopy    . Eye surgery Bilateral   . Back surgery  04/2013  . Cataract extraction    . Blepharoplasty Bilateral 02/23/15  . Vasectomy      Current Outpatient Prescriptions  Medication Sig Dispense Refill  . Acetaminophen 500 MG coapsule Take 2 capsules by mouth daily as needed for pain.     Marland Kitchen aspirin EC 81 MG tablet Take 81 mg by mouth daily.    Marland Kitchen buPROPion (WELLBUTRIN SR) 150 MG 12 hr tablet Take 150 mg by mouth 2 (two) times daily.     . cholecalciferol (VITAMIN D) 1000 UNITS tablet Take 1,000 Units by mouth daily.    . ferrous sulfate ([employer] IRON) 325 (65 FE) MG tablet Take 325 mg by mouth daily.    Marland Kitchen gabapentin (NEURONTIN) 300 MG capsule Take 300 mg by mouth 2 (two) times daily.     Marland Kitchen lovastatin (MEVACOR) 20 MG tablet Take 1 tablet by mouth at bedtime.    . Magnesium 250 MG TABS Take 250 mg by mouth once.    . methocarbamol (ROBAXIN) 500 MG tablet Take 500 mg by mouth every 8 (eight) hours as needed for muscle spasms.    . Multiple Minerals-Vitamins (CITRACAL PLUS BONE DENSITY) TABS Take 2 tablets by mouth daily.    . Multiple Vitamins-Minerals (CENTRUM SILVER ADULT 50+) TABS Take 1 tablet by mouth daily with breakfast.    . nabumetone  (RELAFEN) 500 MG tablet Take 500 mg by mouth 2 (two) times daily as needed for mild pain.     Marland Kitchen omeprazole (PRILOSEC) 40 MG capsule Take 1 capsule (40 mg total) by mouth 2 (two) times daily. 180 capsule 3  . propranolol (INDERAL) 10 MG tablet Take 10 mg by mouth daily.     . ranitidine (ZANTAC) 300 MG tablet Take 1 tablet (300 mg total) by mouth at bedtime. 90 tablet 3  . rOPINIRole (REQUIP) 1 MG tablet Take 1 mg by mouth at bedtime.    . Tamsulosin HCl (FLOMAX) 0.4 MG CAPS Take 0.4 mg by mouth daily.     . traMADol (ULTRAM) 50 MG tablet Take 100 mg by mouth 2 (two) times daily as needed for moderate pain.     Marland Kitchen zoledronic acid (RECLAST) 5 MG/100ML SOLN injection Inject 5 mg into the vein once. Inject once a year last injection was on April 18th 2016     No current facility-administered medications for this visit.    Allergies as of 12/21/2015 - Review Complete 12/21/2015  Allergen Reaction Noted  . Celebrex [celecoxib] Other (See Comments) 04/23/2012  . Cyclobenzaprine Other (See Comments) 09/07/2015    Vitals: BP 138/68 mmHg  Pulse 72  Resp 20  Ht 5' 6.5" (1.689 m)  Wt 182 lb (82.555 kg)  BMI 28.94 kg/m2 Last Weight:  Wt Readings from Last 1 Encounters:  12/21/15 182 lb (82.555 kg)   Last Height:   Ht Readings from Last 1 Encounters:  12/21/15 5' 6.5" (1.689 m)  Physical exam:  General: The patient is awake, alert and appears not in acute distress. The patient is well groomed. Head: Normocephalic, atraumatic. Neck is supple. Mallampati 2, neck circumference: 16 inches. He has a tongue tremor,  A droopy left mouth. Reduced facial expression.  Cardiovascular:  Regular rate and rhythm , without carotid bruit, and without distended neck veins. Mitral murmur?  Respiratory: Lungs are clear to auscultation. Skin:  Without evidence of edema, or rash Trunk: BMI is normal, as is  posture.  Neurologic exam : The patient is fatigued, drowsy. oriented to place and time.  Memory  subjective  described as delayed. There is a normal attention span & concentration ability.  Speech is fluent without  dysarthria, dysphonia or aphasia. Mood and affect are appropriate.  Cranial nerves: Pupils are equal and briskly reactive to light. Funduscopic exam without  evidence of pallor or edema. Status post cataract surgery - dry eyes.  Extraocular movements  in vertical and horizontal planes intact and without nystagmus. Visual fields by finger perimetry are intact. Hearing to finger rub intact.  Facial sensation intact to fine touch. Facial motor strength - left sided droop,  tongue and uvula move midline. No fasciculations , not titubation.   Motor exam:  Cogwheeling mildly- over either biceps.  normal muscle bulk and symmetric normal strength in all extremities. Very  strong grip remains present. .   Sensory:  Fine touch, pinprick and vibration were tested in all extremities. Proprioception is  normal.  Coordination: Rapid alternating movements in the fingers/hands is tested and normal. Finger-to-nose maneuver tested and  with dysmetria and  Tremor- more on the right , dominant side. Patient reports tremor impairs his hand writing.   Gait and station: Patient walks without assistive device - Strength within normal limits. Stance is stable and normal.  Steps are unfragmented.  Deep tendon reflexes:  Status post lumbar fusion , times 2 , several levels  in the  upper  extremities are symmetric and intact.   Assessment:  After physical and neurologic examination, review of laboratory studies, imaging, neurophysiology testing and pre-existing records, assessment is :  Visit 20 minutes with download of machine ,tracing reviewed and mask fit demonstrated, depression and sleepiness cores obtained, 50% of the time spent with face to face conversation, information about the treatment and diagnosis , improving the AHI.   1) OSA confirmed and presenting with improvement on  Auto CPAP 5-10 cm  water, 95% is 10 cm CPAP,  residual AHi of 5.1 .He is highly compliant with his CPAP, 100%   2)  essential tremor as it is symmetric.  Propranolol bid now, Dr. Philip Aspen dosed the medication.  REM BD in the setting of early Parkinson's disease coud be present in this pleasant gentleman.  He has RLS. He has pain in his posterior leg, hamstrings. Dr Louanne Skye addresses pain with a prescription of Nubametone bid.   3) depression present , but not likely a major contributor to fatigue and sleepiness, as his GDS was 4 . The patient is in treatment for depression for the last 25 years, and has had multiple relapses.  Number for restless legs the patient stated that this keeps him frequently from falling asleep. He is taking ropinirole 1 mg at night and twice a day gabapentin. I would like for him to take 2 doses of ropinirole 1 in the late afternoon before bedtime and 1 at that time this way rest his leg should not arise and bother him.  Plan:  Treatment plan and additional workup : OSA not well controlled on 8 cm water. Returned to auto-set in January 2017  Dr.  Louanne Skye attends  pain care to him. He prescribed meds for back pain , which helps with RLS. He may continue.      Min Tunnell, MD   Cc Dr Louanne Skye and Dr Philip Aspen.

## 2015-12-21 NOTE — Patient Instructions (Signed)
Next Rv with Ward Givens, NP in 6 month, after wards yearly.

## 2015-12-23 DIAGNOSIS — M6281 Muscle weakness (generalized): Secondary | ICD-10-CM | POA: Diagnosis not present

## 2015-12-23 DIAGNOSIS — M79604 Pain in right leg: Secondary | ICD-10-CM | POA: Diagnosis not present

## 2015-12-23 DIAGNOSIS — R2681 Unsteadiness on feet: Secondary | ICD-10-CM | POA: Diagnosis not present

## 2015-12-23 DIAGNOSIS — M545 Low back pain: Secondary | ICD-10-CM | POA: Diagnosis not present

## 2015-12-28 DIAGNOSIS — R2681 Unsteadiness on feet: Secondary | ICD-10-CM | POA: Diagnosis not present

## 2015-12-28 DIAGNOSIS — M6281 Muscle weakness (generalized): Secondary | ICD-10-CM | POA: Diagnosis not present

## 2015-12-28 DIAGNOSIS — M79604 Pain in right leg: Secondary | ICD-10-CM | POA: Diagnosis not present

## 2015-12-28 DIAGNOSIS — M545 Low back pain: Secondary | ICD-10-CM | POA: Diagnosis not present

## 2015-12-30 DIAGNOSIS — M6281 Muscle weakness (generalized): Secondary | ICD-10-CM | POA: Diagnosis not present

## 2015-12-30 DIAGNOSIS — M545 Low back pain: Secondary | ICD-10-CM | POA: Diagnosis not present

## 2015-12-30 DIAGNOSIS — M79604 Pain in right leg: Secondary | ICD-10-CM | POA: Diagnosis not present

## 2015-12-30 DIAGNOSIS — R2681 Unsteadiness on feet: Secondary | ICD-10-CM | POA: Diagnosis not present

## 2016-01-02 ENCOUNTER — Other Ambulatory Visit: Payer: Self-pay | Admitting: Gastroenterology

## 2016-01-04 DIAGNOSIS — R2681 Unsteadiness on feet: Secondary | ICD-10-CM | POA: Diagnosis not present

## 2016-01-04 DIAGNOSIS — M545 Low back pain: Secondary | ICD-10-CM | POA: Diagnosis not present

## 2016-01-04 DIAGNOSIS — M6281 Muscle weakness (generalized): Secondary | ICD-10-CM | POA: Diagnosis not present

## 2016-01-04 DIAGNOSIS — M79604 Pain in right leg: Secondary | ICD-10-CM | POA: Diagnosis not present

## 2016-01-06 DIAGNOSIS — M545 Low back pain: Secondary | ICD-10-CM | POA: Diagnosis not present

## 2016-01-06 DIAGNOSIS — R2681 Unsteadiness on feet: Secondary | ICD-10-CM | POA: Diagnosis not present

## 2016-01-06 DIAGNOSIS — M79604 Pain in right leg: Secondary | ICD-10-CM | POA: Diagnosis not present

## 2016-01-06 DIAGNOSIS — M6281 Muscle weakness (generalized): Secondary | ICD-10-CM | POA: Diagnosis not present

## 2016-01-10 ENCOUNTER — Telehealth: Payer: Self-pay | Admitting: Gastroenterology

## 2016-01-10 MED ORDER — OMEPRAZOLE 40 MG PO CPDR: 40.0000 mg | DELAYED_RELEASE_CAPSULE | Freq: Two times a day (BID) | ORAL | Status: DC

## 2016-01-10 NOTE — Telephone Encounter (Signed)
Informed patient that we sent the new prescription for omeprazole to [facility] for a 90 day supply. Also that I deleted the old pharmacy. Pt verbalized understanding.

## 2016-01-11 DIAGNOSIS — R2681 Unsteadiness on feet: Secondary | ICD-10-CM | POA: Diagnosis not present

## 2016-01-11 DIAGNOSIS — M545 Low back pain: Secondary | ICD-10-CM | POA: Diagnosis not present

## 2016-01-11 DIAGNOSIS — M79604 Pain in right leg: Secondary | ICD-10-CM | POA: Diagnosis not present

## 2016-01-11 DIAGNOSIS — M6281 Muscle weakness (generalized): Secondary | ICD-10-CM | POA: Diagnosis not present

## 2016-01-13 DIAGNOSIS — H26493 Other secondary cataract, bilateral: Secondary | ICD-10-CM | POA: Diagnosis not present

## 2016-01-13 DIAGNOSIS — H35373 Puckering of macula, bilateral: Secondary | ICD-10-CM | POA: Diagnosis not present

## 2016-01-13 DIAGNOSIS — H524 Presbyopia: Secondary | ICD-10-CM | POA: Diagnosis not present

## 2016-01-14 DIAGNOSIS — R2681 Unsteadiness on feet: Secondary | ICD-10-CM | POA: Diagnosis not present

## 2016-01-14 DIAGNOSIS — M6281 Muscle weakness (generalized): Secondary | ICD-10-CM | POA: Diagnosis not present

## 2016-01-14 DIAGNOSIS — M79604 Pain in right leg: Secondary | ICD-10-CM | POA: Diagnosis not present

## 2016-01-14 DIAGNOSIS — M545 Low back pain: Secondary | ICD-10-CM | POA: Diagnosis not present

## 2016-01-21 DIAGNOSIS — M180 Bilateral primary osteoarthritis of first carpometacarpal joints: Secondary | ICD-10-CM | POA: Diagnosis not present

## 2016-02-01 DIAGNOSIS — M545 Low back pain: Secondary | ICD-10-CM | POA: Diagnosis not present

## 2016-02-01 DIAGNOSIS — M79604 Pain in right leg: Secondary | ICD-10-CM | POA: Diagnosis not present

## 2016-02-01 DIAGNOSIS — R2681 Unsteadiness on feet: Secondary | ICD-10-CM | POA: Diagnosis not present

## 2016-02-01 DIAGNOSIS — M6281 Muscle weakness (generalized): Secondary | ICD-10-CM | POA: Diagnosis not present

## 2016-03-07 DIAGNOSIS — Z79899 Other long term (current) drug therapy: Secondary | ICD-10-CM | POA: Diagnosis not present

## 2016-03-07 DIAGNOSIS — Z6827 Body mass index (BMI) 27.0-27.9, adult: Secondary | ICD-10-CM | POA: Diagnosis not present

## 2016-03-07 DIAGNOSIS — M810 Age-related osteoporosis without current pathological fracture: Secondary | ICD-10-CM | POA: Diagnosis not present

## 2016-03-08 DIAGNOSIS — M5412 Radiculopathy, cervical region: Secondary | ICD-10-CM | POA: Diagnosis not present

## 2016-03-08 DIAGNOSIS — M4806 Spinal stenosis, lumbar region: Secondary | ICD-10-CM | POA: Diagnosis not present

## 2016-03-08 DIAGNOSIS — M5416 Radiculopathy, lumbar region: Secondary | ICD-10-CM | POA: Diagnosis not present

## 2016-03-10 DIAGNOSIS — R202 Paresthesia of skin: Secondary | ICD-10-CM | POA: Diagnosis not present

## 2016-03-21 ENCOUNTER — Other Ambulatory Visit (HOSPITAL_COMMUNITY): Payer: Self-pay | Admitting: *Deleted

## 2016-03-22 ENCOUNTER — Ambulatory Visit (HOSPITAL_COMMUNITY)
Admission: RE | Admit: 2016-03-22 | Discharge: 2016-03-22 | Disposition: A | Discharge status: Home / Self Care | Payer: Medicare Other | Source: Ambulatory Visit | Attending: Internal Medicine | Admitting: Internal Medicine

## 2016-03-22 DIAGNOSIS — M810 Age-related osteoporosis without current pathological fracture: Secondary | ICD-10-CM | POA: Insufficient documentation

## 2016-03-22 MED ORDER — ZOLEDRONIC ACID 5 MG/100ML IV SOLN
INTRAVENOUS | Status: AC
  Administered 2016-03-22: 5 mg via INTRAVENOUS
  Filled 2016-03-22: qty 100

## 2016-03-22 MED ORDER — ZOLEDRONIC ACID 5 MG/100ML IV SOLN
5.0000 mg | Freq: Once | INTRAVENOUS | Status: AC
  Administered 2016-03-22: 5 mg via INTRAVENOUS

## 2016-03-31 DIAGNOSIS — N400 Enlarged prostate without lower urinary tract symptoms: Secondary | ICD-10-CM | POA: Diagnosis not present

## 2016-04-06 DIAGNOSIS — N401 Enlarged prostate with lower urinary tract symptoms: Secondary | ICD-10-CM | POA: Diagnosis not present

## 2016-04-06 DIAGNOSIS — Z0000 Encounter for general adult medical examination without abnormal findings: Secondary | ICD-10-CM | POA: Diagnosis not present

## 2016-04-06 DIAGNOSIS — R3912 Poor urinary stream: Secondary | ICD-10-CM | POA: Diagnosis not present

## 2016-04-06 DIAGNOSIS — R972 Elevated prostate specific antigen [PSA]: Secondary | ICD-10-CM | POA: Diagnosis not present

## 2016-04-07 DIAGNOSIS — M4806 Spinal stenosis, lumbar region: Secondary | ICD-10-CM | POA: Diagnosis not present

## 2016-04-07 DIAGNOSIS — M5136 Other intervertebral disc degeneration, lumbar region: Secondary | ICD-10-CM | POA: Diagnosis not present

## 2016-04-07 DIAGNOSIS — M5416 Radiculopathy, lumbar region: Secondary | ICD-10-CM | POA: Diagnosis not present

## 2016-04-12 DIAGNOSIS — Z6828 Body mass index (BMI) 28.0-28.9, adult: Secondary | ICD-10-CM | POA: Diagnosis not present

## 2016-04-12 DIAGNOSIS — M5416 Radiculopathy, lumbar region: Secondary | ICD-10-CM | POA: Diagnosis not present

## 2016-04-17 ENCOUNTER — Other Ambulatory Visit: Payer: Self-pay | Admitting: Neurological Surgery

## 2016-05-06 NOTE — Pre-Procedure Instructions (Signed)
Charles [name]  05/06/2016     Your procedure is scheduled on June 6.  Report to [facility] Admitting at 5:30 A.M.  Call this number if you have problems the morning of surgery:  [phone]   Remember:  Do not eat food or drink liquids after midnight.  Take these medicines the morning of surgery with A SIP OF WATER: Propranolol,  Acetaminophen OR Tramadol (if needed), Wellbutrin, Gabapentin, Omeprazole, Flomax,    STOP Fish Oil, Relafen, Multiple Vitamins, Citracal, Magnesium, Vitamin D, Aspirin today   STOP/ Do not take Aspirin, Aleve, Naproxen, Advil, Ibuprofen, Motrin, Vitamins, Herbs, or Supplements starting today   Do not wear jewelry, make-up or nail polish.  Do not wear lotions, powders, or perfumes.  You may wear deodorant.  Do not shave 48 hours prior to surgery.  Men may shave face and neck.  Do not bring valuables to the hospital.  [facility] is not responsible for any belongings or valuables.  Contacts, dentures or bridgework may not be worn into surgery.  Leave your suitcase in the car.  After surgery it may be brought to your room.  For patients admitted to the hospital, discharge time will be determined by your treatment team.  Patients discharged the day of surgery will not be allowed to drive home.   [place] - Preparing for Surgery  Before surgery, you can play an important role.  Because skin is not sterile, your skin needs to be as free of germs as possible.  You can reduce the number of germs on you skin by washing with CHG (chlorahexidine gluconate) soap before surgery.  CHG is an antiseptic cleaner which kills germs and bonds with the skin to continue killing germs even after washing.  Please DO NOT use if you have an allergy to CHG or antibacterial soaps.  If your skin becomes reddened/irritated stop using the CHG and inform your nurse when you arrive at Short Stay.  Do not shave (including legs and underarms) for at least 48 hours  prior to the first CHG shower.  You may shave your face.  Please follow these instructions carefully:   1.  Shower with CHG Soap the night before surgery and the morning of Surgery.  2.  If you choose to wash your hair, wash your hair first as usual with your normal shampoo.  3.  After you shampoo, rinse your hair and body thoroughly to remove the shampoo.  4.  Use CHG as you would any other liquid soap.  You can apply CHG directly to the skin and wash gently with scrungie or a clean washcloth.  5.  Apply the CHG Soap to your body ONLY FROM THE NECK DOWN.  Do not use on open wounds or open sores.  Avoid contact with your eyes, ears, mouth and genitals (private parts).  Wash genitals (private parts) with your normal soap.  6.  Wash thoroughly, paying special attention to the area where your surgery will be performed.  7.  Thoroughly rinse your body with warm water from the neck down.  8.  DO NOT shower/wash with your normal soap after using and rinsing off the CHG Soap.  9.  Pat yourself dry with a clean towel.            10.  Wear clean pajamas.            11.  Place clean sheets on your bed the night of your first shower  and do not sleep with pets.  Day of Surgery  Do not apply any lotions the morning of surgery.  Please wear clean clothes to the hospital/surgery center.

## 2016-05-08 ENCOUNTER — Encounter (HOSPITAL_COMMUNITY): Payer: Self-pay

## 2016-05-08 ENCOUNTER — Encounter (HOSPITAL_COMMUNITY)
Admission: RE | Admit: 2016-05-08 | Discharge: 2016-05-08 | Disposition: A | Discharge status: Home / Self Care | Payer: Medicare Other | Source: Ambulatory Visit | Attending: Neurological Surgery | Admitting: Neurological Surgery

## 2016-05-08 DIAGNOSIS — Z01812 Encounter for preprocedural laboratory examination: Secondary | ICD-10-CM | POA: Diagnosis not present

## 2016-05-08 DIAGNOSIS — M5416 Radiculopathy, lumbar region: Secondary | ICD-10-CM | POA: Diagnosis not present

## 2016-05-08 LAB — BASIC METABOLIC PANEL
Anion gap: 5 (ref 5–15)
BUN: 15 mg/dL (ref 6–20)
CHLORIDE: 106 mmol/L (ref 101–111)
CO2: 28 mmol/L (ref 22–32)
CREATININE: 0.93 mg/dL (ref 0.61–1.24)
Calcium: 9.1 mg/dL (ref 8.9–10.3)
GFR calc Af Amer: >60 mL/min (ref >60)
GFR calc non Af Amer: >60 mL/min (ref >60)
Glucose, Bld: 89 mg/dL (ref 65–99)
Potassium: 4.3 mmol/L (ref 3.5–5.1)
SODIUM: 139 mmol/L (ref 135–145)

## 2016-05-08 LAB — CBC
HEMATOCRIT: 42.9 % (ref 39.0–52.0)
HEMOGLOBIN: 14.2 g/dL (ref 13.0–17.0)
MCH: 30.1 pg (ref 26.0–34.0)
MCHC: 33.1 g/dL (ref 30.0–36.0)
MCV: 91.1 fL (ref 78.0–100.0)
Platelets: 143 10*3/uL — ABNORMAL LOW (ref 150–400)
RBC: 4.71 MIL/uL (ref 4.22–5.81)
RDW: 13.5 % (ref 11.5–15.5)
WBC: 5.8 10*3/uL (ref 4.0–10.5)

## 2016-05-08 LAB — SURGICAL PCR SCREEN
MRSA, PCR: NEGATIVE
Staphylococcus aureus: POSITIVE — AB

## 2016-05-12 MED ORDER — CEFAZOLIN SODIUM-DEXTROSE 2-4 GM/100ML-% IV SOLN
2.0000 g | INTRAVENOUS | Status: AC
  Administered 2016-05-15: 2 g via INTRAVENOUS
  Filled 2016-05-12: qty 100

## 2016-05-14 NOTE — Anesthesia Preprocedure Evaluation (Addendum)
Anesthesia Evaluation  Patient identified by MRN, date of birth, ID band Patient awake    Reviewed: Allergy & Precautions, H&P , Patient's Chart, lab work & pertinent test results, reviewed documented beta blocker date and time   Airway Mallampati: II  TM Distance: >3 FB Neck ROM: full    Dental no notable dental hx. (+) Teeth Intact, Dental Advisory Given   Pulmonary    Pulmonary exam normal breath sounds clear to auscultation       Cardiovascular  Rhythm:regular Rate:Normal     Neuro/Psych    GI/Hepatic   Endo/Other    Renal/GU      Musculoskeletal   Abdominal   Peds  Hematology   Anesthesia Other Findings GERD  Sleep apnea  wears CPAP CAD ....only on stress test; never had MI, PCI or CABG.  In 2008, LVEF was reportedly normal.   AVM (arteriovenous malformation)      Reproductive/Obstetrics                            Anesthesia Physical Anesthesia Plan  ASA: II  Anesthesia Plan: General   Post-op Pain Management:    Induction: Intravenous  Airway Management Planned: Oral ETT and Video Laryngoscope Planned  Additional Equipment:   Intra-op Plan:   Post-operative Plan: Extubation in OR  Informed Consent: I have reviewed the patients History and Physical, chart, labs and discussed the procedure including the risks, benefits and alternatives for the proposed anesthesia with the patient or authorized representative who has indicated his/her understanding and acceptance.   Dental Advisory Given and Dental advisory given  Plan Discussed with: CRNA and Surgeon  Anesthesia Plan Comments: (Limited view with glidescope Grade ll......Marland Kitchen Limited mouth opening noted  Discussed general anesthesia, including possible nausea, instrumentation of airway, sore throat,pulmonary aspiration, etc. I asked if the were any outstanding questions, or  concerns before we proceeded. )         Anesthesia Quick Evaluation

## 2016-05-15 ENCOUNTER — Inpatient Hospital Stay (HOSPITAL_COMMUNITY): Payer: Medicare Other | Admitting: Certified Registered Nurse Anesthetist

## 2016-05-15 ENCOUNTER — Inpatient Hospital Stay (HOSPITAL_COMMUNITY)
Admission: RE | Admit: 2016-05-15 | Discharge: 2016-05-16 | DRG: 517 | Disposition: A | Discharge status: Home / Self Care | Payer: Medicare Other | Source: Ambulatory Visit | Attending: Neurological Surgery | Admitting: Neurological Surgery

## 2016-05-15 ENCOUNTER — Encounter (HOSPITAL_COMMUNITY)
Admission: RE | Disposition: A | Discharge status: Home / Self Care | Payer: Self-pay | Source: Ambulatory Visit | Attending: Neurological Surgery

## 2016-05-15 ENCOUNTER — Inpatient Hospital Stay (HOSPITAL_COMMUNITY): Payer: Medicare Other

## 2016-05-15 ENCOUNTER — Encounter (HOSPITAL_COMMUNITY): Payer: Self-pay | Admitting: Surgery

## 2016-05-15 DIAGNOSIS — K219 Gastro-esophageal reflux disease without esophagitis: Secondary | ICD-10-CM | POA: Diagnosis present

## 2016-05-15 DIAGNOSIS — I2510 Atherosclerotic heart disease of native coronary artery without angina pectoris: Secondary | ICD-10-CM | POA: Diagnosis not present

## 2016-05-15 DIAGNOSIS — G4730 Sleep apnea, unspecified: Secondary | ICD-10-CM | POA: Diagnosis not present

## 2016-05-15 DIAGNOSIS — R338 Other retention of urine: Secondary | ICD-10-CM | POA: Diagnosis not present

## 2016-05-15 DIAGNOSIS — M5416 Radiculopathy, lumbar region: Secondary | ICD-10-CM | POA: Diagnosis not present

## 2016-05-15 DIAGNOSIS — Y838 Other surgical procedures as the cause of abnormal reaction of the patient, or of later complication, without mention of misadventure at the time of the procedure: Secondary | ICD-10-CM | POA: Diagnosis present

## 2016-05-15 DIAGNOSIS — N9989 Other postprocedural complications and disorders of genitourinary system: Secondary | ICD-10-CM | POA: Diagnosis not present

## 2016-05-15 DIAGNOSIS — T8489XA Other specified complication of internal orthopedic prosthetic devices, implants and grafts, initial encounter: Principal | ICD-10-CM | POA: Diagnosis present

## 2016-05-15 DIAGNOSIS — Z472 Encounter for removal of internal fixation device: Secondary | ICD-10-CM | POA: Diagnosis not present

## 2016-05-15 DIAGNOSIS — Z981 Arthrodesis status: Secondary | ICD-10-CM

## 2016-05-15 DIAGNOSIS — Z419 Encounter for procedure for purposes other than remedying health state, unspecified: Secondary | ICD-10-CM

## 2016-05-15 DIAGNOSIS — Z967 Presence of other bone and tendon implants: Secondary | ICD-10-CM

## 2016-05-15 HISTORY — DX: Hypoglycemia, unspecified: E16.2

## 2016-05-15 HISTORY — PX: HARDWARE REMOVAL: SHX979

## 2016-05-15 SURGERY — REMOVAL, HARDWARE
Anesthesia: General | Site: Spine Lumbar | Laterality: Right

## 2016-05-15 MED ORDER — MEPERIDINE HCL 25 MG/ML IJ SOLN: 6.2500 mg | INTRAMUSCULAR | Status: DC | PRN

## 2016-05-15 MED ORDER — TRAMADOL HCL 50 MG PO TABS: 100.0000 mg | ORAL_TABLET | Freq: Two times a day (BID) | ORAL | Status: DC | PRN

## 2016-05-15 MED ORDER — METHOCARBAMOL 1000 MG/10ML IJ SOLN
500.0000 mg | Freq: Four times a day (QID) | INTRAVENOUS | Status: DC | PRN
  Filled 2016-05-15: qty 5

## 2016-05-15 MED ORDER — PROPOFOL 10 MG/ML IV BOLUS
INTRAVENOUS | Status: DC | PRN
  Administered 2016-05-15: 110 mg via INTRAVENOUS

## 2016-05-15 MED ORDER — GABAPENTIN 300 MG PO CAPS
300.0000 mg | ORAL_CAPSULE | Freq: Every day | ORAL | Status: DC
  Administered 2016-05-15: 300 mg via ORAL
  Filled 2016-05-15: qty 1

## 2016-05-15 MED ORDER — ONDANSETRON HCL 4 MG/2ML IJ SOLN
INTRAMUSCULAR | Status: DC | PRN
  Administered 2016-05-15: 4 mg via INTRAVENOUS

## 2016-05-15 MED ORDER — FENTANYL CITRATE (PF) 250 MCG/5ML IJ SOLN
INTRAMUSCULAR | Status: AC
  Filled 2016-05-15: qty 5

## 2016-05-15 MED ORDER — FENTANYL CITRATE (PF) 100 MCG/2ML IJ SOLN: 25.0000 ug | INTRAMUSCULAR | Status: DC | PRN

## 2016-05-15 MED ORDER — LIDOCAINE-EPINEPHRINE 1 %-1:100000 IJ SOLN
INTRAMUSCULAR | Status: DC | PRN
  Administered 2016-05-15: 3.5 mL

## 2016-05-15 MED ORDER — FENTANYL CITRATE (PF) 100 MCG/2ML IJ SOLN
INTRAMUSCULAR | Status: DC | PRN
  Administered 2016-05-15 (×2): 50 ug via INTRAVENOUS

## 2016-05-15 MED ORDER — TAMSULOSIN HCL 0.4 MG PO CAPS
0.4000 mg | ORAL_CAPSULE | Freq: Every day | ORAL | Status: DC
  Administered 2016-05-16: 0.4 mg via ORAL
  Filled 2016-05-15: qty 1

## 2016-05-15 MED ORDER — PANTOPRAZOLE SODIUM 40 MG PO TBEC
40.0000 mg | DELAYED_RELEASE_TABLET | Freq: Two times a day (BID) | ORAL | Status: DC
  Administered 2016-05-16: 40 mg via ORAL
  Filled 2016-05-15: qty 1

## 2016-05-15 MED ORDER — ONDANSETRON HCL 4 MG/2ML IJ SOLN: 4.0000 mg | INTRAMUSCULAR | Status: DC | PRN

## 2016-05-15 MED ORDER — PRAVASTATIN SODIUM 10 MG PO TABS
10.0000 mg | ORAL_TABLET | Freq: Every day | ORAL | Status: DC
  Administered 2016-05-15: 10 mg via ORAL
  Filled 2016-05-15 (×2): qty 1

## 2016-05-15 MED ORDER — ACETAMINOPHEN 325 MG PO TABS: 650.0000 mg | ORAL_TABLET | ORAL | Status: DC | PRN

## 2016-05-15 MED ORDER — ROPINIROLE HCL 1 MG PO TABS
2.0000 mg | ORAL_TABLET | Freq: Every day | ORAL | Status: DC
  Administered 2016-05-15: 2 mg via ORAL
  Filled 2016-05-15: qty 2

## 2016-05-15 MED ORDER — FAMOTIDINE 20 MG PO TABS
20.0000 mg | ORAL_TABLET | Freq: Every day | ORAL | Status: DC
  Administered 2016-05-15: 20 mg via ORAL
  Filled 2016-05-15: qty 1

## 2016-05-15 MED ORDER — PHENOL 1.4 % MT LIQD: 1.0000 | OROMUCOSAL | Status: DC | PRN

## 2016-05-15 MED ORDER — ALUM & MAG HYDROXIDE-SIMETH 200-200-20 MG/5ML PO SUSP
30.0000 mL | Freq: Four times a day (QID) | ORAL | Status: DC | PRN
  Administered 2016-05-15: 30 mL via ORAL
  Filled 2016-05-15: qty 30

## 2016-05-15 MED ORDER — BUPROPION HCL ER (SR) 150 MG PO TB12
150.0000 mg | ORAL_TABLET | Freq: Two times a day (BID) | ORAL | Status: DC
Start: 2016-05-15 — End: 2016-05-16
  Administered 2016-05-15 – 2016-05-16 (×2): 150 mg via ORAL
  Filled 2016-05-15 (×2): qty 1

## 2016-05-15 MED ORDER — METHOCARBAMOL 500 MG PO TABS
500.0000 mg | ORAL_TABLET | Freq: Three times a day (TID) | ORAL | Status: DC | PRN
  Filled 2016-05-15 (×3): qty 1

## 2016-05-15 MED ORDER — TAMSULOSIN HCL 0.4 MG PO CAPS
0.4000 mg | ORAL_CAPSULE | Freq: Once | ORAL | Status: AC
  Administered 2016-05-15: 0.4 mg via ORAL
  Filled 2016-05-15: qty 1

## 2016-05-15 MED ORDER — THROMBIN 5000 UNITS EX SOLR
CUTANEOUS | Status: DC | PRN
  Administered 2016-05-15 (×2): 5000 [IU] via TOPICAL

## 2016-05-15 MED ORDER — NEOSTIGMINE METHYLSULFATE 5 MG/5ML IV SOSY
PREFILLED_SYRINGE | INTRAVENOUS | Status: DC | PRN
  Administered 2016-05-15: 3 mg via INTRAVENOUS

## 2016-05-15 MED ORDER — EPHEDRINE SULFATE-NACL 50-0.9 MG/10ML-% IV SOSY
PREFILLED_SYRINGE | INTRAVENOUS | Status: DC | PRN
  Administered 2016-05-15 (×3): 5 mg via INTRAVENOUS

## 2016-05-15 MED ORDER — SODIUM CHLORIDE 0.9% FLUSH
3.0000 mL | Freq: Two times a day (BID) | INTRAVENOUS | Status: DC
  Administered 2016-05-15 (×2): 3 mL via INTRAVENOUS

## 2016-05-15 MED ORDER — PHENYLEPHRINE 40 MCG/ML (10ML) SYRINGE FOR IV PUSH (FOR BLOOD PRESSURE SUPPORT)
PREFILLED_SYRINGE | INTRAVENOUS | Status: DC | PRN
  Administered 2016-05-15 (×2): 20 ug via INTRAVENOUS

## 2016-05-15 MED ORDER — HEMOSTATIC AGENTS (NO CHARGE) OPTIME
TOPICAL | Status: DC | PRN
  Administered 2016-05-15: 1 via TOPICAL

## 2016-05-15 MED ORDER — ACETAMINOPHEN 650 MG RE SUPP: 650.0000 mg | RECTAL | Status: DC | PRN

## 2016-05-15 MED ORDER — PROPOFOL 10 MG/ML IV BOLUS
INTRAVENOUS | Status: AC
  Filled 2016-05-15: qty 40

## 2016-05-15 MED ORDER — BUPIVACAINE HCL (PF) 0.5 % IJ SOLN
INTRAMUSCULAR | Status: DC | PRN
  Administered 2016-05-15: 20 mL
  Administered 2016-05-15: 3.5 mL

## 2016-05-15 MED ORDER — GLYCOPYRROLATE 0.2 MG/ML IV SOSY
PREFILLED_SYRINGE | INTRAVENOUS | Status: DC | PRN
  Administered 2016-05-15: 0.4 mg via INTRAVENOUS

## 2016-05-15 MED ORDER — ROCURONIUM BROMIDE 10 MG/ML (PF) SYRINGE
PREFILLED_SYRINGE | INTRAVENOUS | Status: DC | PRN
  Administered 2016-05-15: 50 mg via INTRAVENOUS

## 2016-05-15 MED ORDER — ONDANSETRON HCL 4 MG/2ML IJ SOLN: 4.0000 mg | Freq: Once | INTRAMUSCULAR | Status: DC | PRN

## 2016-05-15 MED ORDER — LACTATED RINGERS IV SOLN
INTRAVENOUS | Status: DC | PRN
  Administered 2016-05-15 (×2): via INTRAVENOUS

## 2016-05-15 MED ORDER — MENTHOL 3 MG MT LOZG: 1.0000 | LOZENGE | OROMUCOSAL | Status: DC | PRN

## 2016-05-15 MED ORDER — TRAMADOL HCL 50 MG PO TABS: 50.0000 mg | ORAL_TABLET | Freq: Four times a day (QID) | ORAL | Status: DC | PRN

## 2016-05-15 MED ORDER — PROPRANOLOL HCL 10 MG PO TABS
10.0000 mg | ORAL_TABLET | Freq: Every day | ORAL | Status: DC
  Administered 2016-05-16: 10 mg via ORAL
  Filled 2016-05-15: qty 1

## 2016-05-15 MED ORDER — SODIUM CHLORIDE 0.9 % IR SOLN
Status: DC | PRN
  Administered 2016-05-15: 09:00:00

## 2016-05-15 MED ORDER — KETOROLAC TROMETHAMINE 15 MG/ML IJ SOLN
15.0000 mg | Freq: Four times a day (QID) | INTRAMUSCULAR | Status: DC
  Administered 2016-05-15 (×3): 15 mg via INTRAVENOUS
  Filled 2016-05-15 (×3): qty 1

## 2016-05-15 MED ORDER — MORPHINE SULFATE (PF) 2 MG/ML IV SOLN: 1.0000 mg | INTRAVENOUS | Status: DC | PRN

## 2016-05-15 MED ORDER — DEXTROSE 5 % IV SOLN
10.0000 mg | INTRAVENOUS | Status: DC | PRN
  Administered 2016-05-15: 10 ug/min via INTRAVENOUS

## 2016-05-15 MED ORDER — TRAMADOL HCL 50 MG PO TABS
100.0000 mg | ORAL_TABLET | Freq: Two times a day (BID) | ORAL | Status: DC | PRN
  Administered 2016-05-15 – 2016-05-16 (×2): 100 mg via ORAL
  Filled 2016-05-15 (×2): qty 2

## 2016-05-15 MED ORDER — 0.9 % SODIUM CHLORIDE (POUR BTL) OPTIME
TOPICAL | Status: DC | PRN
  Administered 2016-05-15: 1000 mL

## 2016-05-15 MED ORDER — DEXAMETHASONE SODIUM PHOSPHATE 10 MG/ML IJ SOLN
INTRAMUSCULAR | Status: DC | PRN
  Administered 2016-05-15: 10 mg via INTRAVENOUS

## 2016-05-15 MED ORDER — SODIUM CHLORIDE 0.9% FLUSH: 3.0000 mL | INTRAVENOUS | Status: DC | PRN

## 2016-05-15 MED ORDER — METHOCARBAMOL 500 MG PO TABS
500.0000 mg | ORAL_TABLET | Freq: Four times a day (QID) | ORAL | Status: DC | PRN
  Administered 2016-05-15 – 2016-05-16 (×3): 500 mg via ORAL

## 2016-05-15 MED ORDER — OXYCODONE-ACETAMINOPHEN 5-325 MG PO TABS
1.0000 | ORAL_TABLET | ORAL | Status: DC | PRN
  Administered 2016-05-15: 1 via ORAL
  Filled 2016-05-15: qty 1

## 2016-05-15 SURGICAL SUPPLY — 48 items
ADH SKN CLS APL DERMABOND .7 (GAUZE/BANDAGES/DRESSINGS) ×1
APL SKNCLS STERI-STRIP NONHPOA (GAUZE/BANDAGES/DRESSINGS)
BAG DECANTER FOR FLEXI CONT (MISCELLANEOUS) ×3 IMPLANT
BENZOIN TINCTURE PRP APPL 2/3 (GAUZE/BANDAGES/DRESSINGS) IMPLANT
BLADE CLIPPER SURG (BLADE) IMPLANT
CANISTER SUCT 3000ML PPV (MISCELLANEOUS) ×3 IMPLANT
CLOSURE WOUND 1/2 X4 (GAUZE/BANDAGES/DRESSINGS)
DECANTER SPIKE VIAL GLASS SM (MISCELLANEOUS) ×3 IMPLANT
DERMABOND ADVANCED (GAUZE/BANDAGES/DRESSINGS) ×2
DERMABOND ADVANCED .7 DNX12 (GAUZE/BANDAGES/DRESSINGS) IMPLANT
DRAPE LAPAROTOMY 100X72 PEDS (DRAPES) IMPLANT
DRAPE LAPAROTOMY 100X72X124 (DRAPES) ×2 IMPLANT
DRAPE POUCH INSTRU U-SHP 10X18 (DRAPES) ×3 IMPLANT
DURAPREP 26ML APPLICATOR (WOUND CARE) ×3 IMPLANT
ELECT BLADE 6.5 EXT (BLADE) ×2 IMPLANT
ELECT REM PT RETURN 9FT ADLT (ELECTROSURGICAL) ×3
ELECTRODE REM PT RTRN 9FT ADLT (ELECTROSURGICAL) ×1 IMPLANT
GAUZE SPONGE 4X4 12PLY STRL (GAUZE/BANDAGES/DRESSINGS) ×3 IMPLANT
GAUZE SPONGE 4X4 16PLY XRAY LF (GAUZE/BANDAGES/DRESSINGS) IMPLANT
GLOVE BIOGEL PI IND STRL 8.5 (GLOVE) ×1 IMPLANT
GLOVE BIOGEL PI INDICATOR 8.5 (GLOVE) ×2
GLOVE ECLIPSE 8.5 STRL (GLOVE) ×3 IMPLANT
GOWN STRL REUS W/ TWL LRG LVL3 (GOWN DISPOSABLE) ×1 IMPLANT
GOWN STRL REUS W/ TWL XL LVL3 (GOWN DISPOSABLE) ×1 IMPLANT
GOWN STRL REUS W/TWL 2XL LVL3 (GOWN DISPOSABLE) ×3 IMPLANT
GOWN STRL REUS W/TWL LRG LVL3 (GOWN DISPOSABLE) ×3
GOWN STRL REUS W/TWL XL LVL3 (GOWN DISPOSABLE)
KIT BASIN OR (CUSTOM PROCEDURE TRAY) ×3 IMPLANT
KIT ROOM TURNOVER OR (KITS) ×3 IMPLANT
MARKER SKIN DUAL TIP RULER LAB (MISCELLANEOUS) ×3 IMPLANT
NEEDLE HYPO 22GX1.5 SAFETY (NEEDLE) ×3 IMPLANT
NS IRRIG 1000ML POUR BTL (IV SOLUTION) ×3 IMPLANT
PACK LAMINECTOMY NEURO (CUSTOM PROCEDURE TRAY) ×3 IMPLANT
PAD ARMBOARD 7.5X6 YLW CONV (MISCELLANEOUS) ×3 IMPLANT
SPONGE LAP 4X18 X RAY DECT (DISPOSABLE) IMPLANT
SPONGE SURGIFOAM ABS GEL SZ50 (HEMOSTASIS) ×3 IMPLANT
STAPLER SKIN PROX WIDE 3.9 (STAPLE) IMPLANT
STRIP CLOSURE SKIN 1/2X4 (GAUZE/BANDAGES/DRESSINGS) IMPLANT
SUT VIC AB 0 CT1 18XCR BRD8 (SUTURE) ×1 IMPLANT
SUT VIC AB 0 CT1 8-18 (SUTURE) ×3
SUT VIC AB 2-0 CP2 18 (SUTURE) ×3 IMPLANT
SUT VIC AB 3-0 SH 8-18 (SUTURE) ×5 IMPLANT
SWAB COLLECTION DEVICE MRSA (MISCELLANEOUS) IMPLANT
SYR CONTROL 10ML LL (SYRINGE) ×3 IMPLANT
TOWEL OR 17X24 6PK STRL BLUE (TOWEL DISPOSABLE) ×1 IMPLANT
TOWEL OR 17X26 10 PK STRL BLUE (TOWEL DISPOSABLE) ×3 IMPLANT
TUBE ANAEROBIC SPECIMEN COL (MISCELLANEOUS) IMPLANT
WATER STERILE IRR 1000ML POUR (IV SOLUTION) ×3 IMPLANT

## 2016-05-15 NOTE — Anesthesia Procedure Notes (Signed)
Procedure Name: Intubation Date/Time: 05/15/2016 8:02 AM Performed by: Merdis Delay Pre-anesthesia Checklist: Patient identified, Timeout performed, Emergency Drugs available, Suction available and Patient being monitored Patient Re-evaluated:Patient Re-evaluated prior to inductionOxygen Delivery Method: Circle system utilized Preoxygenation: Pre-oxygenation with 100% oxygen Intubation Type: IV induction Ventilation: Mask ventilation without difficulty Laryngoscope Size: Glidescope and 4 Grade View: Grade I Tube size: 7.5 mm Number of attempts: 1 Airway Equipment and Method: Stylet and Video-laryngoscopy Placement Confirmation: ETT inserted through vocal cords under direct vision,  breath sounds checked- equal and bilateral,  positive ETCO2 and CO2 detector Secured at: 22 cm Tube secured with: Tape Dental Injury: Teeth and Oropharynx as per pre-operative assessment  Difficulty Due To: Difficulty was anticipated Comments: Previous glidescope intubation

## 2016-05-15 NOTE — Progress Notes (Signed)
Patient ID: Charles [name], male   DOB: 02/20/1931, 82 y.o.   MRN: [id number] Comfortable but cannot void  Will need foley overnight Discharge in AM

## 2016-05-15 NOTE — Anesthesia Postprocedure Evaluation (Signed)
Anesthesia Post Note  Patient: Charles [name]  Procedure(s) Performed: Procedure(s) (LRB): Right Lumbar Two-Lumbar Five Removal of Hardware (Right)  Patient location during evaluation: PACU Anesthesia Type: General Level of consciousness: sedated Pain management: satisfactory to patient Vital Signs Assessment: post-procedure vital signs reviewed and stable Respiratory status: spontaneous breathing Cardiovascular status: stable Anesthetic complications: no    Last Vitals:  Filed Vitals:   05/15/16 1030 05/15/16 1036  BP:  144/62  Pulse: 55 55  Temp:  36.2 C  Resp: 29 18    Last Pain: There were no vitals filed for this visit.               Riccardo Dubin

## 2016-05-15 NOTE — H&P (Signed)
CHIEF COMPLAINT:                                          Pain in the right lower extremity approximately 18 months ago.    HISTORY OF PRESENT ILLNESS:                     Charles [name] is an 82 year old individual who tells me that he has had a couple of back operations.  The most recent was in 2014 where he had a fusion done posteriorly by Dr. Sharyne Richters.  He notes that after the surgery, he felt quite well, but in the last year or so he developed significant and quite severe pain in that right lower extremity.  He has had all manner of workup, including a myelogram and a postmyelogram CAT scan, and he is referred now for further evaluating of this process and consideration of any intervention.    PAST MEDICAL HISTORY:                                Notes that he has generally had good health.  He has had Barrett's esophagus and a hiatal hernia.      . Prior Operations:  He has had a Spigelian hernia surgery back in 2011.  He has had two back operations, the first being in 04/1995 by Dr. Louanne Skye, and then the second being the fusion in 04/2013.    . Medications and Allergies:  HE NOTES ALLERGIES TO CELEBREX AND CYCLOBENZAPRINE.  Current medication list includes Methocarbamol, Tamsulosin, Omeprazole, Ranitidine, Lovastatin, Bupropion, Enteric-Coated Aspirin, Centrum Silver, Vitamin D3 supplement, Fish Oil, Ropinirole, Propranolol, a Laxative, Gabapentin, Reclast, Acetaminophen, and Magnesium Oxide.    SOCIAL HISTORY:                                            Personal History reveals that he does not smoke, nor does he use any alcohol.    REVIEW OF SYSTEMS:                                    Notable for use of a hearing aid, some hearing loss, nasal congestion and drainage, inability to smell, sinus problems, heart murmur, high cholesterol, leg weakness, back pain and leg pain, joint pain and swelling, arthritis and neck pain, in addition to difficulty with his memory and inability to  concentrate.  He also notes some history of depression.  This is all noted on a 14-Point Review Sheet that was reviewed in the office today.  He brings along a list of various issues that he has had over time, including the inability to smell, sinus problems, heart murmur, high cholesterol, his last x-rays, gastritis, leg weakness, back pain, joint pain, neck pain, BREAST PAIN, TENDERNESS AND SWELLING CAUSED BY FINASTERIDE which was discontinued.  On his pain drawing, he notes that most of the pain is down the right leg and across the low buttocks.  He has had some pain in the groin on the left side, but this since passed.  He also gets some pain across the shoulders and the  base of the neck.  PHYSICAL EXAMINATION:                                  . General: - His height and weight have been stable at 5 feet 7 inches and 175 pounds.    . Musculoskeletal Examination - Stands straight and erect.  He walks with good motor function in the lower extremities.  Straight leg raising is negative to 80 degrees.  Patrick's maneuver is negative bilaterally.    NEUROLOGICAL EXAMINATION:             . Motor Examination - Tibialis anterior strength seems intact to toe and heel walking.  Tone and bulk are normal.    . Deep Tendon Reflexes - Reflexes are absent in the patellae and the Achilles both.    DIAGNOSTIC STUDIES:                        Review of the patient's findings on the myelogram and postmyelogram CAT scan suggest that there may be some medial cut-out of the screws in the upper portion of his fusion.  I noted this finding to him.  IMPRESSION AND PLAN:                     The only thing that I can suggest that may be aggravating some radicular pain for him.  I noted that it would be easy enough to remove the hardware on the right side, although he does have a transverse connector, and this would require a separate incision to remove the left side of the transverse connector.  I noted that at this point  removing the hardware may yield some relief on the nerve, but it may not yield any.  In any event, it is the simplest thing to do, and it is the only abnormality that I can see that could be giving him some right lumbar radiculopathy.  After some consideration of this option, he would like to proceed, and we will plan on scheduling this at the earliest possible convenience.

## 2016-05-15 NOTE — Op Note (Signed)
Date of surgery: 05/15/2016 Preoperative diagnosis: Right L5 radiculopathy status post decompression fusion L2-L5 on the right Postoperative diagnosis: Right L5 radiculopathy status post decompression fusion L2-L5 on right Procedure: Removal of hardware from right side of spine L2-L5 Surgeon: Kristeen Miss Anesthesia: Gen. endotracheal Indications: [name] is an 82 year old individual who underwent surgical decompression and fusion by Dr. needed to couple of years ago. He's had persistent right-sided radiculopathy seemingly in an L5 distribution. There is some evidence that the lower L5 screw may be having some cutout. As it has been discussed that removal of the hardware may yield some resolution of his pain. He is now taken to the operating room for that.  Procedure: The patient was brought to the operating room supine on a stretcher. After the smooth induction of general endotracheal anesthesia his turned prone. The back was prepped with alcohol DuraPrep and draped in a sterile fashion. Fluoroscopic guidance was used to localize the hardware in right side. The midline incision was created on the right side trying the superior and inferior most portion of the hardware. A Metrix retractor system was then used placing a guidewire down to the first portion of the hardware and then using a series of dilators to open up the area at the transverse connector. Transverse connector was released from the right side first and then a small pass was made with the Metrix retractor on the left side to release left-sided portion of the transverse connector in addition to the midline screw. With this the transverse connector could be mobilized and it was lifted out of itself on the left side and pulled out on the right side. Then the screw caps were each individually loosened through the one incision on the right side. At L5 however the screw Was not able to be accessed but through separate stab incision that  was made with Metrix retractor being used at the L5 area. With the screw caps removed the rod was then removed followed sequentially by each of the screws. In the end fluoroscopic imaging was used to verify that all the hardware had been removed from the right side and the remnants of the transverse connector were left. The wounds were irrigated copiously and then the fascia was closed with 2-0 Vicryl in interrupted fashion 2-0 Vicryl was used in the subcuticular tissues and 3-0 Vicryl subcuticularly on the final layer. Blood loss is estimated about 25 mL patient tolerated procedure well.

## 2016-05-15 NOTE — Transfer of Care (Signed)
Immediate Anesthesia Transfer of Care Note  Patient: Charles [name]  Procedure(s) Performed: Procedure(s) with comments: Right Lumbar Two-Lumbar Five Removal of Hardware (Right) - Right L2-5 removal of hardware  Patient Location: PACU  Anesthesia Type:General  Level of Consciousness: sedated, patient cooperative and responds to stimulation  Airway & Oxygen Therapy: Patient Spontanous Breathing and Patient connected to nasal cannula oxygen  Post-op Assessment: Report given to RN and Post -op Vital signs reviewed and stable  Post vital signs: Reviewed and stable  Last Vitals:  Filed Vitals:   05/15/16 0617  BP: 123/56  Pulse: 50  Temp: 36.5 C  Resp: 20    Last Pain: There were no vitals filed for this visit.       Complications: No apparent anesthesia complications

## 2016-05-15 NOTE — Evaluation (Signed)
Physical Therapy Evaluation Patient Details Name: Charles [name] MRN: [id number] DOB: [birth year] Today's Date: 05/15/2016   History of Present Illness  patient is an 82 yo male s/p Right Lumbar Two-Lumbar Five Removal of Hardware.  Clinical Impression  Patient seen for mobility assessment and education s/p spinal surgery. Patient mobilizing well, receptive to education. No further acute PT needs, will sign off.    Follow Up Recommendations No PT follow up    Equipment Recommendations  None recommended by PT    Recommendations for Other Services       Precautions / Restrictions Precautions Precautions: Back Precaution Comments: verbally reviewed for comfort Restrictions Weight Bearing Restrictions: No      Mobility  Bed Mobility Overal bed mobility: Modified Independent             General bed mobility comments: increased time to perform, no cues or assist required  Transfers Overall transfer level: Independent Equipment used: None                Ambulation/Gait Ambulation/Gait assistance: Modified independent (Device/Increase time) Ambulation Distance (Feet): 380 Feet Assistive device: None Gait Pattern/deviations: Trunk flexed Gait velocity: decreased Gait velocity interpretation: Below normal speed for age/gender General Gait Details: decreased gait speed with fwd head and flexed posture, some modest instability noted but no physical assist required  Stairs            Wheelchair Mobility    Modified Rankin (Stroke Patients Only)       Balance                             High level balance activites: Backward walking;Direction changes;Head turns High Level Balance Comments: modest instability noted, one LOB but able to self correct             Pertinent Vitals/Pain Pain Assessment: Faces Faces Pain Scale: Hurts even more Pain Location:  (back and head) Pain Descriptors / Indicators: Sore;Headache Pain  Intervention(s): Monitored during session    Home Living Family/patient expects to be discharged to:: Private residence Living Arrangements: Spouse/significant other Available Help at Discharge: Family Type of Home: Independent living facility Home Access: Level entry     Home Layout: One level Home Equipment: Environmental consultant - 2 wheels;Cane - single point      Prior Function Level of Independence: Independent         Comments: uses an RW for long distance ambulation to [employer]     Hand Dominance   Dominant Hand: Right    Extremity/Trunk Assessment   Upper Extremity Assessment: Overall WFL for tasks assessed           Lower Extremity Assessment: Overall WFL for tasks assessed         Communication   Communication: HOH  Cognition Arousal/Alertness: Awake/alert Behavior During Therapy: Flat affect Overall Cognitive Status: Within Functional Limits for tasks assessed                      General Comments General comments (skin integrity, edema, etc.): bleeding from incision site, nsg applied dressing    Exercises        Assessment/Plan    PT Assessment Patent does not need any further PT services  PT Diagnosis Difficulty walking;Acute pain   PT Problem List    PT Treatment Interventions     PT Goals (Current goals can be found in the Care Plan section) Acute Rehab PT  Goals PT Goal Formulation: All assessment and education complete, DC therapy    Frequency     Barriers to discharge        Co-evaluation               End of Session   Activity Tolerance: Patient tolerated treatment well Patient left: in bed;with call bell/phone within reach Nurse Communication: Mobility status    Functional Assessment Tool Used: clinical judgement Functional Limitation: Mobility: Walking and moving around Mobility: Walking and Moving Around Current Status [id number]): At least 1 percent but less than 20 percent impaired, limited or  restricted Mobility: Walking and Moving Around Goal Status [phone]): At least 1 percent but less than 20 percent impaired, limited or restricted Mobility: Walking and Moving Around Discharge Status [phone]): At least 1 percent but less than 20 percent impaired, limited or restricted    Time: 1152-1210 PT Time Calculation (min) (ACUTE ONLY): 18 min   Charges:   PT Evaluation $PT Eval Low Complexity: 1 Procedure     PT G Codes:   PT G-Codes **NOT FOR INPATIENT CLASS** Functional Assessment Tool Used: clinical judgement Functional Limitation: Mobility: Walking and moving around Mobility: Walking and Moving Around Current Status [id number]): At least 1 percent but less than 20 percent impaired, limited or restricted Mobility: Walking and Moving Around Goal Status [phone]): At least 1 percent but less than 20 percent impaired, limited or restricted Mobility: Walking and Moving Around Discharge Status [phone]): At least 1 percent but less than 20 percent impaired, limited or restricted    Duncan Dull 05/15/2016, 12:18 PM Alben Deeds, [place] DPT  [phone]

## 2016-05-16 ENCOUNTER — Encounter (HOSPITAL_COMMUNITY): Payer: Self-pay | Admitting: Neurological Surgery

## 2016-05-16 NOTE — Progress Notes (Signed)
Patient ID: Charles [name], male   DOB: 07-Oct-1931, 82 y.o.   MRN: [id number] Vital signs are stable Motor function is intact Patient has Foley remove this morning Awaiting on voiding

## 2016-05-16 NOTE — Progress Notes (Signed)
Patient alert and oriented, mae's well, voiding adequate amount of urine, swallowing without difficulty, no c/o pain. Patient discharged home with family. Script and discharged instructions given to patient. Patient and family stated understanding of d/c instructions given and has an appointment with MD. 

## 2016-05-16 NOTE — Progress Notes (Signed)
Occupational Therapy Evaluation/Discharge Patient Details Name: Charles [name] MRN: [id number] DOB: 07-17-1931 Today's Date: 05/16/2016    History of Present Illness patient is an 82 yo male s/p Right Lumbar Two-Lumbar Five Removal of Hardware.   Clinical Impression   Patient has been evaluated by Occupational Therapy with no acute OT needs identified. Pt completed ADLs and transfers at overall supervision level with cues for compensatory strategies. Reviewed back precautions, AE use, gradual activity progression and fall prevention strategies. All education has been completed and pt has no further questions. No OT follow up or DME recommendations at this time. OT signing off. Thank you for this referral.    Follow Up Recommendations  No OT follow up;Supervision - Intermittent    Equipment Recommendations  None recommended by OT    Recommendations for Other Services       Precautions / Restrictions Precautions Precautions: Back Precaution Booklet Issued: Yes (comment) Precaution Comments: Pt able to recall 1/3 back precautions, advised pt these are more for comfort and safety Restrictions Weight Bearing Restrictions: No      Mobility Bed Mobility Overal bed mobility: Modified Independent             General bed mobility comments: Good demonstration of log roll technique.  Transfers Overall transfer level: Modified independent Equipment used: None             General transfer comment: No physical assist required. No LOB observed or reports of dizziness upon standing    Balance Overall balance assessment: Needs assistance Sitting-balance support: No upper extremity supported;Feet supported Sitting balance-Leahy Scale: Normal     Standing balance support: No upper extremity supported;During functional activity Standing balance-Leahy Scale: Good                              ADL Overall ADL's : Needs assistance/impaired         Upper  Body Bathing: Modified independent;Sitting   Lower Body Bathing: Supervison/ safety;Sit to/from stand;Cueing for compensatory techniques Lower Body Bathing Details (indicate cue type and reason): able to cross ankle-over-knee Upper Body Dressing : Modified independent;Sitting   Lower Body Dressing: Supervision/safety;Sit to/from stand;Cueing for compensatory techniques Lower Body Dressing Details (indicate cue type and reason): able to cross ankle-over-knee, discussed asking wife for assist  Toilet Transfer: Modified Independent;Ambulation;Regular Toilet   Toileting- Clothing Manipulation and Hygiene: Modified independent;Sit to/from stand   Tub/ Shower Transfer: Walk-in shower;Supervision/safety;Cueing for safety;Ambulation   Functional mobility during ADLs: Supervision/safety General ADL Comments: Educated on back precautions, gradually increasing activity level, and fall prevention strategies. No family present for OT eval.     Vision Vision Assessment?: No apparent visual deficits   Perception     Praxis      Pertinent Vitals/Pain Pain Assessment: No/denies pain     Hand Dominance Right   Extremity/Trunk Assessment Upper Extremity Assessment Upper Extremity Assessment: Overall WFL for tasks assessed   Lower Extremity Assessment Lower Extremity Assessment: Overall WFL for tasks assessed   Cervical / Trunk Assessment Cervical / Trunk Assessment: Other exceptions Cervical / Trunk Exceptions: forward head, rounded shoulders   Communication Communication Communication: No difficulties   Cognition Arousal/Alertness: Awake/alert Behavior During Therapy: WFL for tasks assessed/performed Overall Cognitive Status: Within Functional Limits for tasks assessed (Pt reported some attention difficulties, but thinks d/t sx)       Memory: Decreased recall of precautions             General  Comments       Exercises       Shoulder Instructions      Home Living  Family/patient expects to be discharged to:: Private residence Living Arrangements: Spouse/significant other Available Help at Discharge: Family;Available 24 hours/day Type of Home: Independent living facility ([facility]) Home Access: Level entry     Home Layout: One level     Bathroom Shower/Tub: Walk-in shower;Door   Bathroom Toilet: Handicapped height     Home Equipment: Environmental consultant - 2 wheels;Cane - single point;Shower seat - built in;Grab bars - toilet;Grab bars - tub/shower;Hand held shower head;Adaptive equipment Adaptive Equipment: Long-handled sponge        Prior Functioning/Environment Level of Independence: Independent with assistive device(s)        Comments: uses an RW for long distance ambulation to [employer], drives    OT Diagnosis: Acute pain   OT Problem List: Decreased activity tolerance;Impaired balance (sitting and/or standing);Decreased safety awareness;Pain   OT Treatment/Interventions:      OT Goals(Current goals can be found in the care plan section) Acute Rehab OT Goals Patient Stated Goal: to be able to sit down for longer periods of time without pain OT Goal Formulation: With patient Time For Goal Achievement: 05/30/16 Potential to Achieve Goals: Good  OT Frequency:     Barriers to D/C:            Co-evaluation              End of Session Equipment Utilized During Treatment: Gait belt Nurse Communication: Mobility status  Activity Tolerance: Patient tolerated treatment well Patient left: in chair;with call bell/phone within reach   Time: 0806-0826 OT Time Calculation (min): 20 min Charges:  OT General Charges $OT Visit: 1 Procedure OT Evaluation $OT Eval Low Complexity: 1 Procedure G-Codes:    Redmond Baseman, OTR/L PagerUD:[phone] 05/16/2016, 9:20 AM

## 2016-05-23 DIAGNOSIS — R338 Other retention of urine: Secondary | ICD-10-CM | POA: Diagnosis not present

## 2016-06-08 DIAGNOSIS — R972 Elevated prostate specific antigen [PSA]: Secondary | ICD-10-CM | POA: Diagnosis not present

## 2016-06-12 DIAGNOSIS — M545 Low back pain: Secondary | ICD-10-CM | POA: Diagnosis not present

## 2016-06-12 DIAGNOSIS — M6281 Muscle weakness (generalized): Secondary | ICD-10-CM | POA: Diagnosis not present

## 2016-06-12 DIAGNOSIS — M79604 Pain in right leg: Secondary | ICD-10-CM | POA: Diagnosis not present

## 2016-06-12 DIAGNOSIS — R293 Abnormal posture: Secondary | ICD-10-CM | POA: Diagnosis not present

## 2016-06-12 DIAGNOSIS — R2681 Unsteadiness on feet: Secondary | ICD-10-CM | POA: Diagnosis not present

## 2016-06-14 DIAGNOSIS — R972 Elevated prostate specific antigen [PSA]: Secondary | ICD-10-CM | POA: Diagnosis not present

## 2016-06-14 DIAGNOSIS — C61 Malignant neoplasm of prostate: Secondary | ICD-10-CM | POA: Diagnosis not present

## 2016-06-20 ENCOUNTER — Ambulatory Visit (INDEPENDENT_AMBULATORY_CARE_PROVIDER_SITE_OTHER): Payer: Medicare Other | Admitting: Adult Health

## 2016-06-20 ENCOUNTER — Encounter: Payer: Self-pay | Admitting: Adult Health

## 2016-06-20 VITALS — BP 148/66 | HR 62 | Ht 67.0 in | Wt 185.2 lb

## 2016-06-20 DIAGNOSIS — G2581 Restless legs syndrome: Secondary | ICD-10-CM | POA: Diagnosis not present

## 2016-06-20 DIAGNOSIS — Z9989 Dependence on other enabling machines and devices: Principal | ICD-10-CM

## 2016-06-20 DIAGNOSIS — G4733 Obstructive sleep apnea (adult) (pediatric): Secondary | ICD-10-CM

## 2016-06-20 MED ORDER — GABAPENTIN 300 MG PO CAPS: ORAL_CAPSULE | ORAL | Status: DC

## 2016-06-20 NOTE — Discharge Summary (Signed)
Physician Discharge Summary  Patient ID: Charles [name] MRN: [id number] DOB/AGE: 80/06/1931 80 y.o.  Admit date: 05/15/2016 Discharge date: 05/16/2016  Admission Diagnoses: Chronic lumbar radiculopathy on the right  Discharge Diagnoses: Chronic lumbar radiculopathy on right status post decompression and fusion L2-5. Postoperative urinary retention Active Problems:   Fixation hardware in spine   Discharged Condition: good  Hospital Course: Patient was admitted to undergo surgery to remove hardware from his lumbar fusion on the right which was felt to the involved with perpetuating lumbar radiculopathy he tolerated surgery well.  Consults: None  Significant Diagnostic Studies: None  Treatments: Removal of hardware using fluoroscopic guidance on the right  Discharge Exam: Blood pressure 113/50, pulse 62, temperature 97.7 F (36.5 C), temperature source Oral, resp. rate 18, SpO2 97 %. Incisions are clean and dry and station and gait is intact  Disposition: 01-Home or Self Care  Discharge Instructions    Call MD for:  redness, tenderness, or signs of infection (pain, swelling, redness, odor or green/yellow discharge around incision site)    Complete by:  As directed      Call MD for:  severe uncontrolled pain    Complete by:  As directed      Call MD for:  temperature >100.4    Complete by:  As directed      Diet - low sodium heart healthy    Complete by:  As directed      Discharge instructions    Complete by:  As directed   Okay to shower. Do not apply salves or appointments to incision. No heavy lifting with the upper extremities greater than 15 pounds. May resume driving when not requiring pain medication and patient feels comfortable with doing so.     Increase activity slowly    Complete by:  As directed             Medication List    STOP taking these medications        gabapentin 300 MG capsule  Commonly known as:  NEURONTIN      TAKE these medications         Acetaminophen 500 MG coapsule  Take 2 capsules by mouth daily as needed for pain.     aspirin EC 81 MG tablet  Take 81 mg by mouth daily.     buPROPion 150 MG 12 hr tablet  Commonly known as:  WELLBUTRIN SR  Take 150 mg by mouth 2 (two) times daily.     CENTRUM SILVER ADULT 50+ Tabs  Take 1 tablet by mouth daily with breakfast.     cholecalciferol 1000 units tablet  Commonly known as:  VITAMIN D  Take 1,000 Units by mouth daily.     CITRACAL PLUS BONE DENSITY Tabs  Take 1 tablet by mouth 2 (two) times daily.     [employer] IRON 325 (65 FE) MG tablet  Generic drug:  ferrous sulfate  Take 325 mg by mouth daily.     Fish Oil 1200 MG Caps  Take 1 capsule by mouth daily.     lovastatin 20 MG tablet  Commonly known as:  MEVACOR  Take 1 tablet by mouth at bedtime.     Magnesium 250 MG Tabs  Take 250 mg by mouth daily.     methocarbamol 500 MG tablet  Commonly known as:  ROBAXIN  Take 500 mg by mouth every 8 (eight) hours as needed for muscle spasms.     nabumetone 500 MG tablet  Commonly  known as:  RELAFEN  Take 500 mg by mouth 2 (two) times daily as needed for mild pain.     omeprazole 40 MG capsule  Commonly known as:  PRILOSEC  Take 1 capsule (40 mg total) by mouth 2 (two) times daily.     propranolol 10 MG tablet  Commonly known as:  INDERAL  Take 10 mg by mouth daily.     ranitidine 300 MG tablet  Commonly known as:  ZANTAC  Take 1 tablet (300 mg total) by mouth at bedtime.     RECLAST 5 MG/100ML Soln injection  Generic drug:  zoledronic acid  Inject 5 mg into the vein once. Inject once a year last injection was on March 22, 2016     rOPINIRole 1 MG tablet  Commonly known as:  REQUIP  Take 2 mg by mouth at bedtime.     senna 8.6 MG Tabs tablet  Commonly known as:  SENOKOT  Take 1 tablet by mouth daily as needed for mild constipation.     tamsulosin 0.4 MG Caps capsule  Commonly known as:  FLOMAX  Take 0.4 mg by mouth daily.     traMADol 50 MG  tablet  Commonly known as:  ULTRAM  Take 2 tablets (100 mg total) by mouth 2 (two) times daily as needed for moderate pain.           Follow-up Information    Follow up with Earleen Newport, MD.   Specialty:  Neurosurgery   Contact information:   [address] [place] [address] [phone]       Signed: Earleen Newport 06/20/2016, 5:22 PM

## 2016-06-20 NOTE — Progress Notes (Signed)
PATIENT: Charles [name] DOB: Aug 17, 1931  REASON FOR VISIT: follow up-obstructive sleep apnea, essential tremor and restless leg HISTORY FROM: patient  HISTORY OF PRESENT ILLNESS: Today 06/20/2016 Charles [name] is an 82 year old male with a history of obstructive sleep apnea on CPAP, essential tremor and restless leg syndrome. He returns today for follow-up. His CPAP download indicates that he uses machine 30 out of 30 days for compliance of 100%. He uses machine 29 out of 30 days for compliance of 97%. On average he uses his machine 7 hours and 18 minutes. His residual AHI is 9.8 on a minimum pressure of 6 and maximum pressure of 10. He does not have a significant leak. The patient indicates that gabapentin and Requip are helping with his restless legs. He states that he tried to split his Requip dose as Charles [name] requested but found that he sleeps better if he takes the dose together. He also reports that he takes gabapentin 1 tablet at 4 PM and second tablet at 10 PM. He is curious if he can take a third tablet for breakthrough pain. He states that he is tolerating this medication well. Denies any significant drowsiness. He doesn't ascend changes with his balance. Denies any falls. He returns today for an evaluation.  HISTORY 12/21/15 (DOHMEIER):Charles [name] is a 82 y.o. , right handed , caucasian ,a retired Arboriculturist . He was seen on 12-11-14 , after referral from Charles [name] for a sleep apnea Re- evaluation;  Charles [name] recalls being tested for sleep apnea about 12 years ago. It was his primary care physician Charles [name] who referred him for the sleep test at the time to [facility] Sleep lab, where received the diagnosis of a the sleep apnea and was prescribed a CPAP machine. Currently his durable medical equipment company is [place]. He brought his machine here but unfortunately it does not allow to read the [employer] memory chip. He received a new machine just March  19th. 2015 by Charles [name] as the prescribing physician.  He feels that his machine works fine for him but is unaware of the current settings. He wears a Lexicographer Full face mask. He just got that new , too.  He was not required to undergo a new sleep test to qualify ? Medicare approval usually depends on a re evaluation.  Charles Conwayreports a bed time between 10.30 to 11 PM and falls asleep promptly. He has no TV in the bedroom and watches in the den before going to bed. He sleeps often while the TV is runnig, his wife reports. His father was the same well. He will have 2 nocturia breaks, since using CPAP 12 years ago. He has been using hydroxyzine as a sleep aid every night, 50 mg.  He wakes up feeling refreshed, after 7 hours of sleep. He rises at 6.30, uses an alarm . He drinks no caffeine. No ETOH and no tobacco use reported.  He is falling asleep here reporting his medical history.... It appears , that Charles [name] has a resting tremor and los of facial mimic as well as hypophonia.  His wife reports that he has been yelling and thrashing in his sleep, but falls back asleep within minutes, doesn't leave the bed.   January 8 th 2016 :  Charles [name] was seen in every visit within 6 weeks after his initial consultation last year by my nurse practitioner Charlott Holler. He is here today for follow-up on CPAP compliance. Charles [name]  now uses CPAP at 8 cm water with full-time EPR of 1 cm his average time of use for CPAP is 7 hours and 11 minutes and he has a 97% compliance for 30 out of 30 days and 20 a days of over 4 hours continued use. His AHI was 10.8 which is rather high. It is related to any air leaks as I can see be obtained the tracing today here in office 12-11-14 and the patient has 3.7 obstructive residual apneas per hour of sleep. He has never used an hour total titrated before but his machine can be used as such. He would not need to obtain a different machine- just a temporary change  in settings. I would like for him to use the CPAP between 6 at 10 cm water continue with heated humidity and with his current mask as his air leaks are low. DME is [employer] .  The patient endorsed fatigue severity score at 30 points and the Epworth sleepiness score at 8 points and the geriatric depression score at 4 points.    Interval history from 12/21/2015. Charles [name] is seen here today for compliance visit ; he is highly compliant with 100% over 30 days of daily hours of use. 7 hours of 25 minutes is his average daily user time, the machine is an AutoSet between 6 and 10 cm H20, with 1 cm EPR, and his residual AHI is 5.1 which is sufficient. He is highly compliant endorsed today the geriatric depression score at 0 points. The [facility] restless leg syndrome quality of life questionnaire was endorsed at a moderate impairment over the last 4 weeks he felt quite distressed at times by restless legs and it involved changing stool his evening routine, a few times it kept him from attending social events and he states that he is concentrating in the evening which may be a side effect of restless legs. The RLS 6 rating scale was endorsed at 9 to very mild impairment.  Restless legs only bother him when he is trying to fall asleep or when he sits for longer times at rest.  The Epworth sleepiness score was endorsed at 4 points the fatigue severity score at 16 points. He is not in any way bothered by his mild tremor which is symmetrically appearing in both hands and is not very evident at rest but mostly with action. he does not have other Parkinson symptoms. He reports having been evaluated by orthopedist for right leg numbness, which seems to fit an L4 nerve distribution ( attenuation of the patella reflex).    REVIEW OF SYSTEMS: Out of a complete 14 system review of symptoms, the patient complains only of the following symptoms, and all other reviewed systems are negative.  Light  sensitivity, restless leg, apnea, murmur, joint pain, back pain, aching muscles, neck pain, neck stiffness, depression, tremors, bruise/bleed easily  ALLERGIES: Allergies  Allergen Reactions  . Celebrex [Celecoxib] Other (See Comments)    Created stomach ulcers.  . Cyclobenzaprine Other (See Comments)    Caught in hiatal hernia and caused extreme pain    HOME MEDICATIONS: Outpatient Prescriptions Prior to Visit  Medication Sig Dispense Refill  . Acetaminophen 500 MG coapsule Take 2 capsules by mouth daily as needed for pain.     Marland Kitchen aspirin EC 81 MG tablet Take 81 mg by mouth daily.    Marland Kitchen buPROPion (WELLBUTRIN SR) 150 MG 12 hr tablet Take 150 mg by mouth 2 (two) times daily.     Marland Kitchen  cholecalciferol (VITAMIN D) 1000 UNITS tablet Take 1,000 Units by mouth daily.    . ferrous sulfate ([employer] IRON) 325 (65 FE) MG tablet Take 325 mg by mouth daily.    Marland Kitchen lovastatin (MEVACOR) 20 MG tablet Take 1 tablet by mouth at bedtime.    . Magnesium 250 MG TABS Take 250 mg by mouth daily.     . methocarbamol (ROBAXIN) 500 MG tablet Take 500 mg by mouth every 8 (eight) hours as needed for muscle spasms.    . Multiple Minerals-Vitamins (CITRACAL PLUS BONE DENSITY) TABS Take 1 tablet by mouth 2 (two) times daily.     . Multiple Vitamins-Minerals (CENTRUM SILVER ADULT 50+) TABS Take 1 tablet by mouth daily with breakfast.    . nabumetone (RELAFEN) 500 MG tablet Take 500 mg by mouth 2 (two) times daily as needed for mild pain.     . Omega-3 Fatty Acids (FISH OIL) 1200 MG CAPS Take 1 capsule by mouth daily.    Marland Kitchen omeprazole (PRILOSEC) 40 MG capsule Take 1 capsule (40 mg total) by mouth 2 (two) times daily. 180 capsule 1  . propranolol (INDERAL) 10 MG tablet Take 10 mg by mouth daily.     . ranitidine (ZANTAC) 300 MG tablet Take 1 tablet (300 mg total) by mouth at bedtime. 90 tablet 3  . rOPINIRole (REQUIP) 1 MG tablet Take 2 mg by mouth at bedtime.    . senna (SENOKOT) 8.6 MG TABS tablet Take 1 tablet by mouth daily as  needed for mild constipation.    . Tamsulosin HCl (FLOMAX) 0.4 MG CAPS Take 0.4 mg by mouth daily.     . traMADol (ULTRAM) 50 MG tablet Take 2 tablets (100 mg total) by mouth 2 (two) times daily as needed for moderate pain. 60 tablet 3  . zoledronic acid (RECLAST) 5 MG/100ML SOLN injection Inject 5 mg into the vein once. Inject once a year last injection was on March 22, 2016    . gabapentin (NEURONTIN) 300 MG capsule Take 300 mg by mouth at bedtime.     . traMADol (ULTRAM) 50 MG tablet Take 1 tablet (50 mg total) by mouth every 6 (six) hours as needed. 60 tablet 3   No facility-administered medications prior to visit.    PAST MEDICAL HISTORY: Past Medical History  Diagnosis Date  . GERD (gastroesophageal reflux disease)   . Cataract   . Depression   . Hyperlipidemia   . Osteoporosis   . Barrett's esophagus   . Lymphocytosis 06/2012  . Diverticulosis   . Vitamin D deficiency   . Diverticulosis   . BPH (benign prostatic hyperplasia)   . Murmur   . Constipation   . Arthritis   . Urinary retention with incomplete bladder emptying     pt uses bathroom and within 30 mins needs to go again  . Sleep apnea     wears CPAP  . CAD (coronary artery disease)     only on stress test; never had MI, PCI or CABG.  In 2008, LVEF was reportedly normal.   . AVM (arteriovenous malformation)   . Hypoglycemia     PAST SURGICAL HISTORY: Past Surgical History  Procedure Laterality Date  . Upper gastrointestinal endoscopy    . Repair spigelian hernia  2011  . Inguinal hernia repair      x4  . Back surgury      f  . Spinal fusion  1996  . Finger surgery Right     right thumb  .  Colonoscopy    . Eye surgery Bilateral   . Back surgery  04/2013  . Cataract extraction    . Blepharoplasty Bilateral 02/23/15  . Vasectomy    . Hardware removal Right 05/15/2016    Procedure: Right Lumbar Two-Lumbar Five Removal of Hardware;  Surgeon: Kristeen Miss, MD;  Location: [place] [facility];  Service:  Neurosurgery;  Laterality: Right;  Right L2-5 removal of hardware    FAMILY HISTORY: Family History  Problem Relation Age of Onset  . Prostate cancer Father   . CAD Mother   . Arthritis-Osteo Brother     SOCIAL HISTORY: Social History   Social History  . Marital Status: Married    Spouse Name: [name]  . Number of Children: 3  . Years of Education: College   Occupational History  . Retired     retired Arboriculturist   Social History Main Topics  . Smoking status: Never Smoker   . Smokeless tobacco: Never Used  . Alcohol Use: No  . Drug Use: No  . Sexual Activity: Not on file   Other Topics Concern  . Not on file   Social History Narrative   Patient is married [name]).   Patient is a retired Arboriculturist.   Patient lives in [facility] living facility.   Patient has three adult children.   Patient does not drink any caffeine.   Patient is right-handed.      PHYSICAL EXAM  Filed Vitals:   06/20/16 1530  BP: 148/66  Pulse: 62  Height: 5\' 7"  (1.702 m)  Weight: 185 lb 3.2 oz (84.006 kg)   Body mass index is 29 kg/(m^2).  Generalized: Well developed, in no acute distress   Neurological examination  Mentation: Alert oriented to time, place, history taking. Follows all commands speech and language fluent Cranial nerve II-XII: Pupils were equal round reactive to light. Extraocular movements were full, visual field were full on confrontational test. Facial sensation and strength were normal. Uvula tongue midline. Head turning and shoulder shrug  were normal and symmetric. Motor: The motor testing reveals 5 over 5 strength of all 4 extremities. Good symmetric motor tone is noted throughout.  Sensory: Sensory testing is intact to soft touch on all 4 extremities. No evidence of extinction is noted.  Coordination: Cerebellar testing reveals good finger-nose-finger and heel-to-shin bilaterally.  Gait and station: Gait is slightly unsteady. Tandem gait not attended.  Reflexes:  Deep tendon reflexes are symmetric but depressed throughout  DIAGNOSTIC DATA (LABS, IMAGING, TESTING) - I reviewed patient records, labs, notes, testing and imaging myself where available.  Lab Results  Component Value Date   WBC 5.8 05/08/2016   HGB 14.2 05/08/2016   HCT 42.9 05/08/2016   MCV 91.1 05/08/2016   PLT 143* 05/08/2016      Component Value Date/Time   NA 139 05/08/2016 0938   K 4.3 05/08/2016 0938   CL 106 05/08/2016 0938   CO2 28 05/08/2016 0938   GLUCOSE 89 05/08/2016 0938   BUN 15 05/08/2016 0938   CREATININE 0.93 05/08/2016 0938   CALCIUM 9.1 05/08/2016 0938   PROT 5.2* 04/12/2013 0540   ALBUMIN 2.2* 04/12/2013 0540   AST 113* 04/12/2013 0540   ALT 40 04/12/2013 0540   ALKPHOS 51 04/12/2013 0540   BILITOT 0.5 04/12/2013 0540   GFRNONAA >60 05/08/2016 0938   GFRAA >60 05/08/2016 [id number]     ASSESSMENT AND PLAN 82 y.o. year old male  has a past medical history of GERD (gastroesophageal reflux disease); Cataract;  Depression; Hyperlipidemia; Osteoporosis; Barrett's esophagus; Lymphocytosis (06/2012); Diverticulosis; Vitamin D deficiency; Diverticulosis; BPH (benign prostatic hyperplasia); Murmur; Constipation; Arthritis; Urinary retention with incomplete bladder emptying; Sleep apnea; CAD (coronary artery disease); AVM (arteriovenous malformation); and Hypoglycemia. here with:  1. Obstructive sleep apnea on CPAP 2. Restless leg syndrome  The patient will continue using the CPAP nightly. His residual AHI is elevated. For that reason we will Change his AutoSet to 6-15 cm of water. He will continue using Requip and gabapentin for restless legs. I did advise that he could take an additional tablet of gabapentin for breakthrough pain only. Advised that he could cause drowsiness. Patient advised that if his symptoms worsen or he develops any new symptoms he should let us know. Will follow-up in 6 months or sooner if needed.   Ward Givens, MSN, NP-C 06/20/2016, 4:20  PM [facility] [address] [place], [place] [address] [phone]

## 2016-06-20 NOTE — Patient Instructions (Signed)
Continue CPAP nightly  Continue gabapentin 300 mg twice a day. Can take an extra tablet for break through pain. If your symptoms worsen or you develop new symptoms please let us know.

## 2016-06-21 ENCOUNTER — Encounter: Payer: Self-pay | Admitting: Adult Health

## 2016-06-21 DIAGNOSIS — M79604 Pain in right leg: Secondary | ICD-10-CM | POA: Diagnosis not present

## 2016-06-21 DIAGNOSIS — R2681 Unsteadiness on feet: Secondary | ICD-10-CM | POA: Diagnosis not present

## 2016-06-21 DIAGNOSIS — M545 Low back pain: Secondary | ICD-10-CM | POA: Diagnosis not present

## 2016-06-21 DIAGNOSIS — R293 Abnormal posture: Secondary | ICD-10-CM | POA: Diagnosis not present

## 2016-06-21 DIAGNOSIS — M6281 Muscle weakness (generalized): Secondary | ICD-10-CM | POA: Diagnosis not present

## 2016-06-21 NOTE — Progress Notes (Signed)
I agree with the assessment and plan as directed by NP .The patient is known to me .   Ivonne Freeburg, MD  

## 2016-06-23 DIAGNOSIS — C61 Malignant neoplasm of prostate: Secondary | ICD-10-CM | POA: Diagnosis not present

## 2016-06-23 DIAGNOSIS — K5730 Diverticulosis of large intestine without perforation or abscess without bleeding: Secondary | ICD-10-CM | POA: Diagnosis not present

## 2016-06-23 DIAGNOSIS — R972 Elevated prostate specific antigen [PSA]: Secondary | ICD-10-CM | POA: Diagnosis not present

## 2016-06-26 ENCOUNTER — Other Ambulatory Visit: Payer: Self-pay | Admitting: Urology

## 2016-06-26 DIAGNOSIS — C61 Malignant neoplasm of prostate: Secondary | ICD-10-CM | POA: Diagnosis not present

## 2016-06-30 ENCOUNTER — Encounter (HOSPITAL_COMMUNITY)
Admission: RE | Admit: 2016-06-30 | Discharge: 2016-06-30 | Disposition: A | Discharge status: Home / Self Care | Payer: Medicare Other | Source: Ambulatory Visit | Attending: Urology | Admitting: Urology

## 2016-06-30 DIAGNOSIS — C61 Malignant neoplasm of prostate: Secondary | ICD-10-CM | POA: Insufficient documentation

## 2016-06-30 DIAGNOSIS — R937 Abnormal findings on diagnostic imaging of other parts of musculoskeletal system: Secondary | ICD-10-CM | POA: Diagnosis not present

## 2016-06-30 MED ORDER — TECHNETIUM TC 99M MEDRONATE IV KIT
26.3000 | PACK | Freq: Once | INTRAVENOUS | Status: AC | PRN
  Administered 2016-06-30: 26.3 via INTRAVENOUS

## 2016-07-03 ENCOUNTER — Ambulatory Visit (HOSPITAL_COMMUNITY)
Admission: RE | Admit: 2016-07-03 | Discharge: 2016-07-03 | Disposition: A | Discharge status: Home / Self Care | Payer: Medicare Other | Source: Ambulatory Visit | Attending: Urology | Admitting: Urology

## 2016-07-03 ENCOUNTER — Encounter (HOSPITAL_COMMUNITY): Payer: Self-pay

## 2016-07-03 ENCOUNTER — Other Ambulatory Visit: Payer: Self-pay | Admitting: Urology

## 2016-07-03 DIAGNOSIS — C61 Malignant neoplasm of prostate: Secondary | ICD-10-CM

## 2016-07-03 DIAGNOSIS — M47812 Spondylosis without myelopathy or radiculopathy, cervical region: Secondary | ICD-10-CM | POA: Diagnosis not present

## 2016-07-03 DIAGNOSIS — M5134 Other intervertebral disc degeneration, thoracic region: Secondary | ICD-10-CM | POA: Insufficient documentation

## 2016-07-03 DIAGNOSIS — M47814 Spondylosis without myelopathy or radiculopathy, thoracic region: Secondary | ICD-10-CM | POA: Diagnosis not present

## 2016-07-03 DIAGNOSIS — M5136 Other intervertebral disc degeneration, lumbar region: Secondary | ICD-10-CM | POA: Diagnosis not present

## 2016-07-06 DIAGNOSIS — M5416 Radiculopathy, lumbar region: Secondary | ICD-10-CM | POA: Diagnosis not present

## 2016-07-11 DIAGNOSIS — R293 Abnormal posture: Secondary | ICD-10-CM | POA: Diagnosis not present

## 2016-07-11 DIAGNOSIS — M79604 Pain in right leg: Secondary | ICD-10-CM | POA: Diagnosis not present

## 2016-07-11 DIAGNOSIS — M6281 Muscle weakness (generalized): Secondary | ICD-10-CM | POA: Diagnosis not present

## 2016-07-11 DIAGNOSIS — M545 Low back pain: Secondary | ICD-10-CM | POA: Diagnosis not present

## 2016-07-11 DIAGNOSIS — R2681 Unsteadiness on feet: Secondary | ICD-10-CM | POA: Diagnosis not present

## 2016-07-12 DIAGNOSIS — M79604 Pain in right leg: Secondary | ICD-10-CM | POA: Diagnosis not present

## 2016-07-12 DIAGNOSIS — M6281 Muscle weakness (generalized): Secondary | ICD-10-CM | POA: Diagnosis not present

## 2016-07-12 DIAGNOSIS — R293 Abnormal posture: Secondary | ICD-10-CM | POA: Diagnosis not present

## 2016-07-12 DIAGNOSIS — M545 Low back pain: Secondary | ICD-10-CM | POA: Diagnosis not present

## 2016-07-12 DIAGNOSIS — R2681 Unsteadiness on feet: Secondary | ICD-10-CM | POA: Diagnosis not present

## 2016-07-13 DIAGNOSIS — H35373 Puckering of macula, bilateral: Secondary | ICD-10-CM | POA: Diagnosis not present

## 2016-07-13 DIAGNOSIS — H26492 Other secondary cataract, left eye: Secondary | ICD-10-CM | POA: Diagnosis not present

## 2016-07-13 DIAGNOSIS — H524 Presbyopia: Secondary | ICD-10-CM | POA: Diagnosis not present

## 2016-07-14 DIAGNOSIS — M6281 Muscle weakness (generalized): Secondary | ICD-10-CM | POA: Diagnosis not present

## 2016-07-14 DIAGNOSIS — R293 Abnormal posture: Secondary | ICD-10-CM | POA: Diagnosis not present

## 2016-07-14 DIAGNOSIS — M545 Low back pain: Secondary | ICD-10-CM | POA: Diagnosis not present

## 2016-07-14 DIAGNOSIS — R2681 Unsteadiness on feet: Secondary | ICD-10-CM | POA: Diagnosis not present

## 2016-07-14 DIAGNOSIS — M79604 Pain in right leg: Secondary | ICD-10-CM | POA: Diagnosis not present

## 2016-07-17 DIAGNOSIS — M5416 Radiculopathy, lumbar region: Secondary | ICD-10-CM | POA: Diagnosis not present

## 2016-07-17 DIAGNOSIS — Z981 Arthrodesis status: Secondary | ICD-10-CM | POA: Diagnosis not present

## 2016-07-17 DIAGNOSIS — M5417 Radiculopathy, lumbosacral region: Secondary | ICD-10-CM | POA: Diagnosis not present

## 2016-07-18 DIAGNOSIS — R293 Abnormal posture: Secondary | ICD-10-CM | POA: Diagnosis not present

## 2016-07-18 DIAGNOSIS — R2681 Unsteadiness on feet: Secondary | ICD-10-CM | POA: Diagnosis not present

## 2016-07-18 DIAGNOSIS — M545 Low back pain: Secondary | ICD-10-CM | POA: Diagnosis not present

## 2016-07-18 DIAGNOSIS — M79604 Pain in right leg: Secondary | ICD-10-CM | POA: Diagnosis not present

## 2016-07-18 DIAGNOSIS — M6281 Muscle weakness (generalized): Secondary | ICD-10-CM | POA: Diagnosis not present

## 2016-07-19 ENCOUNTER — Encounter: Payer: Self-pay | Admitting: Radiation Oncology

## 2016-07-19 ENCOUNTER — Ambulatory Visit
Admission: RE | Admit: 2016-07-19 | Discharge: 2016-07-19 | Disposition: A | Discharge status: Home / Self Care | Payer: Medicare Other | Source: Ambulatory Visit | Attending: Radiation Oncology | Admitting: Radiation Oncology

## 2016-07-19 VITALS — BP 130/54 | HR 50 | Resp 16 | Ht 67.0 in | Wt 181.1 lb

## 2016-07-19 DIAGNOSIS — E559 Vitamin D deficiency, unspecified: Secondary | ICD-10-CM | POA: Diagnosis not present

## 2016-07-19 DIAGNOSIS — G4730 Sleep apnea, unspecified: Secondary | ICD-10-CM | POA: Insufficient documentation

## 2016-07-19 DIAGNOSIS — M542 Cervicalgia: Secondary | ICD-10-CM | POA: Insufficient documentation

## 2016-07-19 DIAGNOSIS — K2270 Barrett's esophagus without dysplasia: Secondary | ICD-10-CM | POA: Diagnosis not present

## 2016-07-19 DIAGNOSIS — F329 Major depressive disorder, single episode, unspecified: Secondary | ICD-10-CM | POA: Insufficient documentation

## 2016-07-19 DIAGNOSIS — N400 Enlarged prostate without lower urinary tract symptoms: Secondary | ICD-10-CM | POA: Insufficient documentation

## 2016-07-19 DIAGNOSIS — R293 Abnormal posture: Secondary | ICD-10-CM | POA: Diagnosis not present

## 2016-07-19 DIAGNOSIS — K219 Gastro-esophageal reflux disease without esophagitis: Secondary | ICD-10-CM | POA: Insufficient documentation

## 2016-07-19 DIAGNOSIS — Z7982 Long term (current) use of aspirin: Secondary | ICD-10-CM | POA: Insufficient documentation

## 2016-07-19 DIAGNOSIS — C61 Malignant neoplasm of prostate: Secondary | ICD-10-CM | POA: Diagnosis not present

## 2016-07-19 DIAGNOSIS — I2510 Atherosclerotic heart disease of native coronary artery without angina pectoris: Secondary | ICD-10-CM | POA: Insufficient documentation

## 2016-07-19 DIAGNOSIS — M545 Low back pain: Secondary | ICD-10-CM | POA: Diagnosis not present

## 2016-07-19 DIAGNOSIS — M6281 Muscle weakness (generalized): Secondary | ICD-10-CM | POA: Diagnosis not present

## 2016-07-19 DIAGNOSIS — E785 Hyperlipidemia, unspecified: Secondary | ICD-10-CM | POA: Diagnosis not present

## 2016-07-19 DIAGNOSIS — R2681 Unsteadiness on feet: Secondary | ICD-10-CM | POA: Diagnosis not present

## 2016-07-19 DIAGNOSIS — M79604 Pain in right leg: Secondary | ICD-10-CM | POA: Diagnosis not present

## 2016-07-19 DIAGNOSIS — R972 Elevated prostate specific antigen [PSA]: Secondary | ICD-10-CM | POA: Diagnosis not present

## 2016-07-19 HISTORY — DX: Dependence on other enabling machines and devices: Z99.89

## 2016-07-19 HISTORY — DX: Mastodynia: N64.4

## 2016-07-19 HISTORY — DX: Age-related osteoporosis without current pathological fracture: M81.0

## 2016-07-19 HISTORY — DX: Malignant neoplasm of prostate: C61

## 2016-07-19 NOTE — Progress Notes (Signed)
Radiation Oncology         (336) [phone] ________________________________  Initial outpatient Consultation  Name: Charles [name] MRN: [id number]  Date: 07/19/2016  DOB: 04-28-1931  ST:[id number] G, MD  Charles Aloe, MD   REFERRING PHYSICIAN: Festus Aloe, MD  DIAGNOSIS: 82 y.o. gentleman with stage T1c adenocarcinoma of the prostate with a Gleason's score of 4+4 and a PSA of 4.82    ICD-9-CM ICD-10-CM   1. Malignant neoplasm of prostate ([place]) Charles [name] is a 82 y.o. gentleman.  He was noted to have an elevated PSA of 4.82 by his primary care physician, Dr. Sharlett Iles.  Accordingly, he was referred for evaluation in urology by Dr. Junious Silk.  The patient proceeded to transrectal ultrasound with 12 biopsies of the prostate.  Out of 12 core biopsies, 6 were positive.  The maximum Gleason score was 4+4.  The patient reviewed the biopsy results with his urologist and he has kindly been referred today for discussion of potential radiation treatment options.  PREVIOUS RADIATION THERAPY: No  PAST MEDICAL HISTORY:  has a past medical history of Arthritis; AVM (arteriovenous malformation); Barrett's esophagus; BPH (benign prostatic hyperplasia); CAD (coronary artery disease); Cataract; Constipation; CPAP (continuous positive airway pressure) dependence; Depression; Diverticulosis; Diverticulosis; GERD (gastroesophageal reflux disease); Hyperlipidemia; Hypoglycemia; Lymphocytosis (06/2012); Mastodynia; Murmur; Osteoporosis; Osteoporosis; Prostate cancer ([place]); Sleep apnea; Urinary retention with incomplete bladder emptying; and Vitamin D deficiency.    PAST SURGICAL HISTORY: Past Surgical History:  Procedure Laterality Date  . BACK SURGERY  04/2013  . back surgury     f  . BLEPHAROPLASTY Bilateral 02/23/15  . CATARACT EXTRACTION    . COLONOSCOPY    . EYE SURGERY Bilateral   . FINGER SURGERY Right    right thumb  . HARDWARE  REMOVAL Right 05/15/2016   Procedure: Right Lumbar Two-Lumbar Five Removal of Hardware;  Surgeon: Kristeen Miss, MD;  Location: [facility];  Service: Neurosurgery;  Laterality: Right;  Right L2-5 removal of hardware  . INGUINAL HERNIA REPAIR     x4  . REPAIR SPIGELIAN HERNIA  2011  . SPINAL FUSION  1996  . UPPER GASTROINTESTINAL ENDOSCOPY    . VASECTOMY      FAMILY HISTORY: family history includes Arthritis-Osteo in his brother; CAD in his mother; Prostate cancer in his father.  SOCIAL HISTORY:  reports that he has never smoked. He has never used smokeless tobacco. He reports that he does not drink alcohol or use drugs.  ALLERGIES: Celebrex [celecoxib] and Cyclobenzaprine  MEDICATIONS:  Current Outpatient Prescriptions  Medication Sig Dispense Refill  . Acetaminophen 500 MG coapsule Take 2 capsules by mouth daily as needed for pain.     Marland Kitchen aspirin EC 81 MG tablet Take 81 mg by mouth daily.    Marland Kitchen buPROPion (WELLBUTRIN SR) 150 MG 12 hr tablet Take 150 mg by mouth 2 (two) times daily.     . cholecalciferol (VITAMIN D) 1000 UNITS tablet Take 1,000 Units by mouth daily.    . ferrous sulfate ([employer] IRON) 325 (65 FE) MG tablet Take 325 mg by mouth daily.    Marland Kitchen gabapentin (NEURONTIN) 300 MG capsule Take 1 tablet PO at 4 PM  and 1 tablet at 10 PM 180 capsule 3  . lovastatin (MEVACOR) 20 MG tablet Take 1 tablet by mouth at bedtime.    . Magnesium 250 MG TABS Take 250 mg by mouth daily.     . methocarbamol (ROBAXIN) 500 MG  tablet Take 500 mg by mouth every 8 (eight) hours as needed for muscle spasms.    . Multiple Minerals-Vitamins (CITRACAL PLUS BONE DENSITY) TABS Take 1 tablet by mouth 2 (two) times daily.     . Multiple Vitamins-Minerals (CENTRUM SILVER ADULT 50+) TABS Take 1 tablet by mouth daily with breakfast.    . mupirocin ointment (BACTROBAN) 2 %     . nabumetone (RELAFEN) 500 MG tablet Take 500 mg by mouth 2 (two) times daily as needed for mild pain.     . Omega-3 Fatty Acids (FISH OIL)  1200 MG CAPS Take 1 capsule by mouth daily.    Marland Kitchen omeprazole (PRILOSEC) 40 MG capsule Take 1 capsule (40 mg total) by mouth 2 (two) times daily. 180 capsule 1  . propranolol (INDERAL) 10 MG tablet Take 10 mg by mouth daily.     . ranitidine (ZANTAC) 300 MG tablet Take 1 tablet (300 mg total) by mouth at bedtime. 90 tablet 3  . rOPINIRole (REQUIP) 1 MG tablet Take 2 mg by mouth at bedtime.    . senna (SENOKOT) 8.6 MG TABS tablet Take 1 tablet by mouth daily as needed for mild constipation.    . Tamsulosin HCl (FLOMAX) 0.4 MG CAPS Take 0.4 mg by mouth daily.     . traMADol (ULTRAM) 50 MG tablet Take 2 tablets (100 mg total) by mouth 2 (two) times daily as needed for moderate pain. 60 tablet 3  . zoledronic acid (RECLAST) 5 MG/100ML SOLN injection Inject into the vein.     No current facility-administered medications for this encounter.     REVIEW OF SYSTEMS:  A 15 point review of systems is documented in the electronic medical record. This was obtained by the nursing staff. However, I reviewed this with the patient to discuss relevant findings and make appropriate changes.  Pertinent items noted in HPI and remainder of comprehensive ROS otherwise negative..  The patient completed an IPSS and IIEF questionnaire.     PHYSICAL EXAM: This patient is in no acute distress.  He is alert and oriented.   height is 5\' 7"  (1.702 m) and weight is 181 lb 1.6 oz (82.1 kg). His blood pressure is 130/54 (abnormal) and his pulse is 50 (abnormal). His respiration is 16 and oxygen saturation is 100%.  He exhibits no respiratory distress or labored breathing.  He appears neurologically intact.  His mood is pleasant.  His affect is appropriate.  Please note the digital rectal exam findings described above.  KPS = 100  100 - Normal; no complaints; no evidence of disease. 90   - Able to carry on normal activity; minor signs or symptoms of disease. 80   - Normal activity with effort; some signs or symptoms of disease. 82    - Cares for self; unable to carry on normal activity or to do active work. 60   - Requires occasional assistance, but is able to care for most of his personal needs. 50   - Requires considerable assistance and frequent medical care. 37   - Disabled; requires special care and assistance. 32   - Severely disabled; hospital admission is indicated although death not imminent. 13   - Very sick; hospital admission necessary; active supportive treatment necessary. 10   - Moribund; fatal processes progressing rapidly. 0     - Dead  Karnofsky DA, Abelmann WH, Craver LS and Burchenal [facility] [phone]) The use of the nitrogen mustards in the palliative treatment of carcinoma: with particular reference  to bronchogenic carcinoma Cancer 1 634-56   LABORATORY DATA:  Lab Results  Component Value Date   WBC 5.8 05/08/2016   HGB 14.2 05/08/2016   HCT 42.9 05/08/2016   MCV 91.1 05/08/2016   PLT 143 (L) 05/08/2016   Lab Results  Component Value Date   NA 139 05/08/2016   K 4.3 05/08/2016   CL 106 05/08/2016   CO2 28 05/08/2016   Lab Results  Component Value Date   ALT 40 04/12/2013   AST 113 (H) 04/12/2013   ALKPHOS 51 04/12/2013   BILITOT 0.5 04/12/2013     RADIOGRAPHY: Dg Cervical Spine 2 Or 3 Views  Result Date: 07/03/2016 CLINICAL DATA:  History of prostate carcinoma and neck pain, initial encounter EXAM: CERVICAL SPINE - 2-3 VIEW COMPARISON:  06/30/2016 FINDINGS: Seven cervical segments are well visualized. Disc space narrowing with osteophytic changes are noted at C5-6 and C6-7. Facet hypertrophic changes are also noted at multiple levels. This would correspond with that seen on recent bone scan. No focal sclerosis is noted to suggest metastatic disease. The odontoid is within normal limits. IMPRESSION: Degenerative change without acute abnormality. Electronically Signed   By: Inez Catalina M.D.   On: 07/03/2016 10:07  Dg Thoracic Spine 2 View  Result Date: 07/03/2016 CLINICAL DATA:  Abnormal  bone scan, history of prostate carcinoma EXAM: THORACIC SPINE 2 VIEWS COMPARISON:  06/30/2016 FINDINGS: Vertebral body height is well maintained. The pedicles are within normal limits and no paraspinal mass lesion is seen. Multilevel osteophytic changes are noted. This corresponds to that seen on prior bone scan. No acute abnormality is noted. IMPRESSION: Degenerative change without acute abnormality. Electronically Signed   By: Inez Catalina M.D.   On: 07/03/2016 10:07  Nm Bone Scan Whole Body  Result Date: 06/30/2016 CLINICAL DATA:  Prostate cancer. EXAM: NUCLEAR MEDICINE WHOLE BODY BONE SCAN TECHNIQUE: Whole body anterior and posterior images were obtained approximately 3 hours after intravenous injection of radiopharmaceutical. RADIOPHARMACEUTICALS:  26.3 mCi Technetium-64m MDP IV COMPARISON:  CT 06/23/2016.  MRI 10/01/2015. FINDINGS: Bilateral renal function excretion present. Prominent increased activity noted over the upper lumbar spine this is most likely secondary to degenerative change and prior surgery. Punctate areas of increased activity noted in the cervical and thoracic spine. These may be degenerative. Cervical and thoracic spine series can be obtained for further evaluation to evaluate for metastatic disease. Mild increased activity noted about the left knee, most likely degenerative. IMPRESSION: 1. Prominent increased activity noted about the upper lumbar spine, most likely postsurgical and degenerative. 2. Multifocal mild areas increased activity right cervical and thoracic spine. These changes are most likely degenerative. Cervical spine series and thoracic spine series can be obtained to further evaluate for metastatic disease . Electronically Signed   By: Marcello Moores  Register   On: 06/30/2016 13:29     IMPRESSION: This gentleman is a 82 y.o. gentleman with stage T1c adenocarcinoma of the prostate with a Gleason's score of 4+4 and a PSA of 4.82.  His T-Stage, Gleason's Score, and PSA put him  into the high risk group.  Accordingly he is eligible for a variety of potential treatment options including ADT for 2 years plus IMRT.  PLAN:Today I reviewed the findings and workup thus far.  We discussed the natural history of prostate cancer.  We reviewed the the implications of T-stage, Gleason's Score, and PSA on decision-making and outcomes in prostate cancer.  We discussed radiation treatment in the management of prostate cancer with regard to the  logistics and delivery of external beam radiation treatment as well as the logistics and delivery of prostate brachytherapy.  We compared and contrasted each of these approaches and also compared these against prostatectomy.     The patient would like to proceed with ADT and prostate IMRT.  I will share my findings with Dr. Junious Silk and move forward with scheduling starting ADT and placement of three gold fiducial markers into the prostate to proceed with IMRT 8 weeks after starting ADT  I enjoyed meeting with him today, and will look forward to participating in the care of this very nice gentleman.   I spent time face to face with the patient and more than 50% of that time was spent in counseling and/or coordination of care.   ------------------------------------------------  [name]. [name], M.D.

## 2016-07-19 NOTE — Progress Notes (Signed)
GU Location of Tumor / Histology: prostatic adenocarcinoma  If Prostate Cancer, Gleason Score is (4 + 4) and PSA is (4.82)  Julianne Rice presented 05/23/16 with urinary retention  Biopsies of prostate (if applicable) revealed:    Past/Anticipated interventions by urology, if any: biopsy, referral to Dr. [name] to discuss IMRT, plan is for IMRT and ADT  Past/Anticipated interventions by medical oncology, if any: no  Weight changes, if any: no  Bowel/Bladder complaints, if any: nocturia x 2, constipation, ED, less than 1 in 5 patient reports incomplete emptying, frequency, urgency and weak stream. Reports intermittency less than half the time. IPSS 8  Nausea/Vomiting, if any: no  Pain issues, if any: back and joint pain. Degenerative changes in spine no mets on bone scan  SAFETY ISSUES:  Prior radiation? no  Pacemaker/ICD? no  Possible current pregnancy? no  Is the patient on methotrexate? no  Current Complaints / other details:  82 year old male. Married. AX: celebrex and cyclobenzaprine. 27 gram prostate. Father passed with prostate cancer

## 2016-07-19 NOTE — Progress Notes (Signed)
See progress note under physician encounter. 

## 2016-07-20 ENCOUNTER — Ambulatory Visit: Payer: Medicare Other

## 2016-07-20 ENCOUNTER — Ambulatory Visit: Admission: RE | Admit: 2016-07-20 | Payer: Medicare Other | Source: Ambulatory Visit | Admitting: Radiation Oncology

## 2016-07-25 ENCOUNTER — Telehealth: Payer: Self-pay | Admitting: Radiation Oncology

## 2016-07-25 NOTE — Telephone Encounter (Signed)
[name] called concerning that he hadn't heard from [facility] on a plan yet. I talked to Mickel Baas at [facility] who stated that the plan was that they would be calling Charles [name] within the next couple of weeks about the placement of gold seeds. I patched Charles [name] through to [facility] for his question of a hormonal injection that he was to have.

## 2016-07-27 DIAGNOSIS — R7309 Other abnormal glucose: Secondary | ICD-10-CM | POA: Diagnosis not present

## 2016-07-27 DIAGNOSIS — E784 Other hyperlipidemia: Secondary | ICD-10-CM | POA: Diagnosis not present

## 2016-07-27 DIAGNOSIS — D6489 Other specified anemias: Secondary | ICD-10-CM | POA: Diagnosis not present

## 2016-07-27 DIAGNOSIS — E559 Vitamin D deficiency, unspecified: Secondary | ICD-10-CM | POA: Diagnosis not present

## 2016-07-28 DIAGNOSIS — M79604 Pain in right leg: Secondary | ICD-10-CM | POA: Diagnosis not present

## 2016-07-28 DIAGNOSIS — M6281 Muscle weakness (generalized): Secondary | ICD-10-CM | POA: Diagnosis not present

## 2016-07-28 DIAGNOSIS — R2681 Unsteadiness on feet: Secondary | ICD-10-CM | POA: Diagnosis not present

## 2016-07-28 DIAGNOSIS — R293 Abnormal posture: Secondary | ICD-10-CM | POA: Diagnosis not present

## 2016-07-28 DIAGNOSIS — M545 Low back pain: Secondary | ICD-10-CM | POA: Diagnosis not present

## 2016-08-01 DIAGNOSIS — M6281 Muscle weakness (generalized): Secondary | ICD-10-CM | POA: Diagnosis not present

## 2016-08-01 DIAGNOSIS — R293 Abnormal posture: Secondary | ICD-10-CM | POA: Diagnosis not present

## 2016-08-01 DIAGNOSIS — M79604 Pain in right leg: Secondary | ICD-10-CM | POA: Diagnosis not present

## 2016-08-01 DIAGNOSIS — M545 Low back pain: Secondary | ICD-10-CM | POA: Diagnosis not present

## 2016-08-01 DIAGNOSIS — R2681 Unsteadiness on feet: Secondary | ICD-10-CM | POA: Diagnosis not present

## 2016-08-02 DIAGNOSIS — M545 Low back pain: Secondary | ICD-10-CM | POA: Diagnosis not present

## 2016-08-02 DIAGNOSIS — R293 Abnormal posture: Secondary | ICD-10-CM | POA: Diagnosis not present

## 2016-08-02 DIAGNOSIS — M79604 Pain in right leg: Secondary | ICD-10-CM | POA: Diagnosis not present

## 2016-08-02 DIAGNOSIS — M6281 Muscle weakness (generalized): Secondary | ICD-10-CM | POA: Diagnosis not present

## 2016-08-02 DIAGNOSIS — R2681 Unsteadiness on feet: Secondary | ICD-10-CM | POA: Diagnosis not present

## 2016-08-03 DIAGNOSIS — Z0000 Encounter for general adult medical examination without abnormal findings: Secondary | ICD-10-CM | POA: Diagnosis not present

## 2016-08-03 DIAGNOSIS — E784 Other hyperlipidemia: Secondary | ICD-10-CM | POA: Diagnosis not present

## 2016-08-03 DIAGNOSIS — M5136 Other intervertebral disc degeneration, lumbar region: Secondary | ICD-10-CM | POA: Diagnosis not present

## 2016-08-03 DIAGNOSIS — G2581 Restless legs syndrome: Secondary | ICD-10-CM | POA: Diagnosis not present

## 2016-08-03 DIAGNOSIS — I2510 Atherosclerotic heart disease of native coronary artery without angina pectoris: Secondary | ICD-10-CM | POA: Diagnosis not present

## 2016-08-03 DIAGNOSIS — Z6828 Body mass index (BMI) 28.0-28.9, adult: Secondary | ICD-10-CM | POA: Diagnosis not present

## 2016-08-03 DIAGNOSIS — M810 Age-related osteoporosis without current pathological fracture: Secondary | ICD-10-CM | POA: Diagnosis not present

## 2016-08-03 DIAGNOSIS — Z1389 Encounter for screening for other disorder: Secondary | ICD-10-CM | POA: Diagnosis not present

## 2016-08-03 DIAGNOSIS — C61 Malignant neoplasm of prostate: Secondary | ICD-10-CM | POA: Diagnosis not present

## 2016-08-03 DIAGNOSIS — R7309 Other abnormal glucose: Secondary | ICD-10-CM | POA: Diagnosis not present

## 2016-08-03 DIAGNOSIS — R2681 Unsteadiness on feet: Secondary | ICD-10-CM | POA: Diagnosis not present

## 2016-08-03 DIAGNOSIS — G4733 Obstructive sleep apnea (adult) (pediatric): Secondary | ICD-10-CM | POA: Diagnosis not present

## 2016-08-04 ENCOUNTER — Telehealth: Payer: Self-pay | Admitting: *Deleted

## 2016-08-04 NOTE — Telephone Encounter (Signed)
Called patient to inform of gold seed placement on 08-23-16 - arrival time - 9:15 am @ [facility] (lupron to be given on 08-10-16 @ 9 am), his sim will be on 08-25-16 @ 2 pm @ Dr. Johny Shears Office, lvm for a return call.

## 2016-08-09 DIAGNOSIS — R293 Abnormal posture: Secondary | ICD-10-CM | POA: Diagnosis not present

## 2016-08-09 DIAGNOSIS — M6281 Muscle weakness (generalized): Secondary | ICD-10-CM | POA: Diagnosis not present

## 2016-08-09 DIAGNOSIS — M545 Low back pain: Secondary | ICD-10-CM | POA: Diagnosis not present

## 2016-08-09 DIAGNOSIS — M79604 Pain in right leg: Secondary | ICD-10-CM | POA: Diagnosis not present

## 2016-08-09 DIAGNOSIS — R2681 Unsteadiness on feet: Secondary | ICD-10-CM | POA: Diagnosis not present

## 2016-08-10 DIAGNOSIS — N509 Disorder of male genital organs, unspecified: Secondary | ICD-10-CM | POA: Diagnosis not present

## 2016-08-10 DIAGNOSIS — C61 Malignant neoplasm of prostate: Secondary | ICD-10-CM | POA: Diagnosis not present

## 2016-08-10 DIAGNOSIS — N401 Enlarged prostate with lower urinary tract symptoms: Secondary | ICD-10-CM | POA: Diagnosis not present

## 2016-08-11 DIAGNOSIS — M545 Low back pain: Secondary | ICD-10-CM | POA: Diagnosis not present

## 2016-08-11 DIAGNOSIS — M6281 Muscle weakness (generalized): Secondary | ICD-10-CM | POA: Diagnosis not present

## 2016-08-11 DIAGNOSIS — Z23 Encounter for immunization: Secondary | ICD-10-CM | POA: Diagnosis not present

## 2016-08-11 DIAGNOSIS — R2681 Unsteadiness on feet: Secondary | ICD-10-CM | POA: Diagnosis not present

## 2016-08-11 DIAGNOSIS — M79604 Pain in right leg: Secondary | ICD-10-CM | POA: Diagnosis not present

## 2016-08-11 DIAGNOSIS — R293 Abnormal posture: Secondary | ICD-10-CM | POA: Diagnosis not present

## 2016-08-15 DIAGNOSIS — R293 Abnormal posture: Secondary | ICD-10-CM | POA: Diagnosis not present

## 2016-08-15 DIAGNOSIS — M545 Low back pain: Secondary | ICD-10-CM | POA: Diagnosis not present

## 2016-08-15 DIAGNOSIS — R2681 Unsteadiness on feet: Secondary | ICD-10-CM | POA: Diagnosis not present

## 2016-08-15 DIAGNOSIS — M6281 Muscle weakness (generalized): Secondary | ICD-10-CM | POA: Diagnosis not present

## 2016-08-15 DIAGNOSIS — M79604 Pain in right leg: Secondary | ICD-10-CM | POA: Diagnosis not present

## 2016-08-16 DIAGNOSIS — R2681 Unsteadiness on feet: Secondary | ICD-10-CM | POA: Diagnosis not present

## 2016-08-16 DIAGNOSIS — M545 Low back pain: Secondary | ICD-10-CM | POA: Diagnosis not present

## 2016-08-16 DIAGNOSIS — R293 Abnormal posture: Secondary | ICD-10-CM | POA: Diagnosis not present

## 2016-08-16 DIAGNOSIS — M6281 Muscle weakness (generalized): Secondary | ICD-10-CM | POA: Diagnosis not present

## 2016-08-16 DIAGNOSIS — M79604 Pain in right leg: Secondary | ICD-10-CM | POA: Diagnosis not present

## 2016-08-17 DIAGNOSIS — M5416 Radiculopathy, lumbar region: Secondary | ICD-10-CM | POA: Diagnosis not present

## 2016-08-23 DIAGNOSIS — C61 Malignant neoplasm of prostate: Secondary | ICD-10-CM | POA: Diagnosis not present

## 2016-08-24 DIAGNOSIS — D485 Neoplasm of uncertain behavior of skin: Secondary | ICD-10-CM | POA: Diagnosis not present

## 2016-08-24 DIAGNOSIS — L270 Generalized skin eruption due to drugs and medicaments taken internally: Secondary | ICD-10-CM | POA: Diagnosis not present

## 2016-08-24 DIAGNOSIS — C44729 Squamous cell carcinoma of skin of left lower limb, including hip: Secondary | ICD-10-CM | POA: Diagnosis not present

## 2016-08-25 ENCOUNTER — Ambulatory Visit
Admission: RE | Admit: 2016-08-25 | Discharge: 2016-08-25 | Disposition: A | Discharge status: Home / Self Care | Payer: Medicare Other | Source: Ambulatory Visit | Attending: Radiation Oncology | Admitting: Radiation Oncology

## 2016-08-25 DIAGNOSIS — C61 Malignant neoplasm of prostate: Secondary | ICD-10-CM

## 2016-08-25 NOTE — Progress Notes (Signed)
  Radiation Oncology         (336) [phone] ________________________________  Name: Charles [name] MRN: [id number]  Date: 08/25/2016  DOB: December 27, 1930  SIMULATION AND TREATMENT PLANNING NOTE  Rescheduled to 11/9 ________________________________  [name]. [name], M.D.

## 2016-08-29 DIAGNOSIS — M542 Cervicalgia: Secondary | ICD-10-CM | POA: Diagnosis not present

## 2016-09-22 ENCOUNTER — Ambulatory Visit (INDEPENDENT_AMBULATORY_CARE_PROVIDER_SITE_OTHER): Payer: Medicare Other | Admitting: Physician Assistant

## 2016-09-22 VITALS — BP 102/66 | HR 62 | Temp 97.5°F | Resp 18 | Ht 67.0 in | Wt 183.0 lb

## 2016-09-22 DIAGNOSIS — S91114A Laceration without foreign body of right lesser toe(s) without damage to nail, initial encounter: Secondary | ICD-10-CM | POA: Diagnosis not present

## 2016-09-22 MED ORDER — MUPIROCIN 2 % EX OINT: 1.0000 "application " | TOPICAL_OINTMENT | Freq: Three times a day (TID) | CUTANEOUS | 0 refills | Status: DC

## 2016-09-22 NOTE — Patient Instructions (Addendum)
Please keep your toe clean by washing with soap and water. Do not soak it in warm water. Please keep it dry after your shower.  Apply mupirocin ointment on it as directed. Return to clinic in one week for a re-check.     IF you received an x-ray today, you will receive an invoice from [facility]. Please contact [facility] at [phone] with questions or concerns regarding your invoice.   IF you received labwork today, you will receive an invoice from [employer]. Please contact [facility] at [phone] with questions or concerns regarding your invoice.   Our billing staff will not be able to assist you with questions regarding bills from these companies.  You will be contacted with the lab results as soon as they are available. The fastest way to get your results is to activate your My Chart account. Instructions are located on the last page of this paperwork. If you have not heard from [place] regarding the results in 2 weeks, please contact this office.

## 2016-09-22 NOTE — Progress Notes (Signed)
Charles [name]  MRN: [id number] DOB: 01-Apr-1931  PCP: Donnajean Lopes, MD  Subjective:  Pt is an 82 year old male HTN, Barrett's esophagus, diverticulosis, OSA, who presents to clinic for wound on his right toe. States for the past two weeks he has had a sore spot on the tip of his toe, mildly painful, no drainage. He has been soaking it in warm water with epsom salts and covering it with a bandage. No MOI. Wears sturdy orthopedic shoes.  He cannot groom his toe nails due to prior spine surgery which left him unable to bend forward.    Review of Systems  Constitutional: Negative for chills, fatigue and fever.  Respiratory: Negative for cough, shortness of breath and wheezing.   Cardiovascular: Negative for chest pain and palpitations.  Musculoskeletal: Negative for gait problem.  Skin: Positive for wound. Negative for color change and pallor.  Neurological: Negative for weakness, light-headedness and numbness.    Patient Active Problem List   Diagnosis Date Noted  . Malignant neoplasm of prostate ([place]) 07/19/2016  . Fixation hardware in spine 05/15/2016  . Blepharoptosis 02/02/2015  . Hypoglycemic reaction 02/02/2015  . Depression determined by examination 12/11/2014  . Tremor, essential 12/11/2014  . OSA on CPAP 04/24/2014  . 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 Tremor of hands and face 04/24/2014  . Hypersomnia with sleep apnea, unspecified 04/24/2014  . Depression 04/18/2013  . BPH (benign prostatic hyperplasia) 04/18/2013  . Other and unspecified hyperlipidemia 04/18/2013  . Impingement syndrome of right shoulder 04/14/2013    Class: Acute  . Postoperative anemia due to acute blood loss 04/11/2013    Class: Acute  . Respiratory failure, post-operative ([place]) 04/09/2013  . Spondylolisthesis of lumbar region 04/08/2013    Class: Chronic  . Scoliosis or kyphoscoliosis 04/08/2013    Class: Chronic  . Lumbosacral spondylosis without myelopathy 04/08/2013   Class: Chronic  . Acute respiratory failure ([place]) 04/08/2013  . HTN (hypertension) 04/08/2013  . Altered mental status 04/08/2013  . OSA (obstructive sleep apnea) 04/08/2013  . Lymphocytosis   . HIATAL HERNIA 11/21/2010  . DIVERTICULAR DISEASE 11/21/2010  . BARRETT'S ESOPHAGUS 02/22/2010  . HOARSENESS 02/22/2010  . GERD 07/20/2008  . INGUINAL HERNIA, LEFT, SMALL 07/20/2008  . OTH VENTRAL HERN W/O MENTION OBSTRUCTION/GANGREN 07/20/2008  . CONSTIPATION 07/20/2008  . DYSPHAGIA 07/20/2008  . FECAL OCCULT BLOOD 07/20/2008    Current Outpatient Prescriptions on File Prior to Visit  Medication Sig Dispense Refill  . Acetaminophen 500 MG coapsule Take 2 capsules by mouth daily as needed for pain.     Marland Kitchen aspirin EC 81 MG tablet Take 81 mg by mouth daily.    Marland Kitchen buPROPion (WELLBUTRIN SR) 150 MG 12 hr tablet Take 150 mg by mouth 2 (two) times daily.     . cholecalciferol (VITAMIN D) 1000 UNITS tablet Take 1,000 Units by mouth daily.    . ferrous sulfate (CVS IRON) 325 (65 FE) MG tablet Take 325 mg by mouth daily.    Marland Kitchen gabapentin (NEURONTIN) 300 MG capsule Take 1 tablet PO at 4 PM  and 1 tablet at 10 PM 180 capsule 3  . lovastatin (MEVACOR) 20 MG tablet Take 1 tablet by mouth at bedtime.    . Magnesium 250 MG TABS Take 250 mg by mouth daily.     . methocarbamol (ROBAXIN) 500 MG tablet Take 500 mg by mouth every 8 (eight) hours as needed for muscle spasms.    . Multiple Minerals-Vitamins (CITRACAL PLUS BONE DENSITY)  TABS Take 1 tablet by mouth 2 (two) times daily.     . Multiple Vitamins-Minerals (CENTRUM SILVER ADULT 50+) TABS Take 1 tablet by mouth daily with breakfast.    . nabumetone (RELAFEN) 500 MG tablet Take 500 mg by mouth 2 (two) times daily as needed for mild pain.     . Omega-3 Fatty Acids (FISH OIL) 1200 MG CAPS Take 1 capsule by mouth daily.    Marland Kitchen omeprazole (PRILOSEC) 40 MG capsule Take 1 capsule (40 mg total) by mouth 2 (two) times daily. 180 capsule 1  . propranolol (INDERAL) 10 MG  tablet Take 10 mg by mouth daily.     . ranitidine (ZANTAC) 300 MG tablet Take 1 tablet (300 mg total) by mouth at bedtime. 90 tablet 3  . rOPINIRole (REQUIP) 1 MG tablet Take 2 mg by mouth at bedtime.    . senna (SENOKOT) 8.6 MG TABS tablet Take 1 tablet by mouth daily as needed for mild constipation.    . Tamsulosin HCl (FLOMAX) 0.4 MG CAPS Take 0.4 mg by mouth daily.     . traMADol (ULTRAM) 50 MG tablet Take 2 tablets (100 mg total) by mouth 2 (two) times daily as needed for moderate pain. 60 tablet 3  . zoledronic acid (RECLAST) 5 MG/100ML SOLN injection Inject into the vein.    . mupirocin ointment (BACTROBAN) 2 %      No current facility-administered medications on file prior to visit.     Allergies  Allergen Reactions  . Celebrex [Celecoxib] Other (See Comments)    Created stomach ulcers.  . Cyclobenzaprine Other (See Comments)    Caught in hiatal hernia and caused extreme pain    Objective:  BP 102/66 (BP Location: Right Arm, Patient Position: Sitting, Cuff Size: Small)   Pulse 62   Temp 97.5 F (36.4 C) (Oral)   Resp 18   Ht 5\' 7"  (1.702 m)   Wt 183 lb (83 kg)   SpO2 98%   BMI 28.66 kg/m   Physical Exam  Constitutional: He is oriented to person, place, and time and well-developed, well-nourished, and in no distress. No distress.  Cardiovascular: Normal rate, regular rhythm and normal heart sounds.   Neurological: He is alert and oriented to person, place, and time. GCS score is 15.  Skin: Skin is warm and dry.  Hyperkeratosis of fourth digit of right toe with underlying fluctuant mass. No erythema, drainage or streaking. Not TTP. Decreased sensation of toe, however patient notes this is his normal.   Psychiatric: Mood, memory, affect and judgment normal.  Vitals reviewed.  Procedure: Verbal consent obtained. Unroofed several layers of hyperkeratotic skin with 15 blade. Macerated tissue discovered, with minor bleeding. No purulent drainage. Wound was wrapped with  gauze and coban. Wound care discussed with patient.   Assessment and Plan :  1. Laceration of fourth toe of right foot, initial encounter - mupirocin ointment (BACTROBAN) 2 %; Apply 1 application topically 3 (three) times daily.  Dispense: 30 g; Refill: 0 - Supportive care and wound care discussed with patient. Keep it clean, washing with soap and water. Do not soak. Discussed with patient soaking his feet could have exacerbated the macerated tissue underneath his hyperkeratotic skin. RTC if no improvement. Consider referral to podiatry in the future for routine foot care, as he has limited forward flexion and cannot care for feet. He declines it at this time.   [place] Pod, PA-C  [facility] 09/22/2016  8:10 AM

## 2016-09-27 DIAGNOSIS — H26491 Other secondary cataract, right eye: Secondary | ICD-10-CM | POA: Diagnosis not present

## 2016-09-27 DIAGNOSIS — H524 Presbyopia: Secondary | ICD-10-CM | POA: Diagnosis not present

## 2016-09-29 ENCOUNTER — Ambulatory Visit (INDEPENDENT_AMBULATORY_CARE_PROVIDER_SITE_OTHER): Payer: Medicare Other | Admitting: Physician Assistant

## 2016-09-29 VITALS — BP 110/66 | HR 58 | Temp 97.8°F | Resp 18 | Ht 67.0 in | Wt 183.0 lb

## 2016-09-29 DIAGNOSIS — L089 Local infection of the skin and subcutaneous tissue, unspecified: Secondary | ICD-10-CM | POA: Diagnosis not present

## 2016-09-29 DIAGNOSIS — L84 Corns and callosities: Secondary | ICD-10-CM | POA: Diagnosis not present

## 2016-09-29 MED ORDER — CEPHALEXIN 500 MG PO CAPS: 500.0000 mg | ORAL_CAPSULE | Freq: Four times a day (QID) | ORAL | 0 refills | Status: DC

## 2016-09-29 NOTE — Patient Instructions (Addendum)
Go buy OTC corn pads for future use. You can use this on your big toe right now but wait until the corn on your 4th digit has completely healed (no blood) before using it on this toe. In the meantime, you can keep applying the ointment to the tip of the fourth toe and also to the spot where the bandaid tore your skin. Keep covered with a non adherent band while your shoes are on. You can let this air out while at home if you are not up walking around.   I am prescribing an antibiotic for the surrounding redness. It will be four pills every day for five days. I would like you to follow up in one week so we can reevaluate the spot and at that time we can consider the podiatry referral.   Below are the three podiatry clinics we refer to:  [place]  [facility]   -Return to clinic sooner if symptoms worsen  IF you received an x-ray today, you will receive an invoice from [facility]. Please contact [facility] at [phone] with questions or concerns regarding your invoice.   IF you received labwork today, you will receive an invoice from [employer]. Please contact [facility] at [phone] with questions or concerns regarding your invoice.   Our billing staff will not be able to assist you with questions regarding bills from these companies.  You will be contacted with the lab results as soon as they are available. The fastest way to get your results is to activate your My Chart account. Instructions are located on the last page of this paperwork. If you have not heard from [place] regarding the results in 2 weeks, please contact this office.

## 2016-09-29 NOTE — Progress Notes (Signed)
MRN: [id number] DOB: 1931/01/10  Subjective:   Charles [name] is a 82 y.o. male presenting for follow up on wound on fourth digit of right toe. Pt initially seen on 09/22/16 for sore on right 4th digit. During this visit, several layers of hyperkeratotic skin were unroofed with 15 blade. Pt instructed to use bactroban ointment and keep it clean with soap and water.   Since this visit, pt has been cleaning it appropriately and using the ointment. He was covering it up with nonadherent dressing but did use a bandaid once so it ripped surrounding skin off. He now has some redness in the 4th digit. Denies pus drainage, pain, fever, and chills.  Of note, pt has a hard time tending to the toe because he has a back fusion and cannot bend forward fully. He would like to consider podiatry referall at this time but wants to discuss it with his wife before we put in the reeferral.    Charles [name] has a current medication list which includes the following prescription(s): acetaminophen, aspirin ec, bupropion, cholecalciferol, ferrous sulfate, gabapentin, lovastatin, magnesium, methocarbamol, citracal plus bone density, centrum silver adult 50+, mupirocin ointment, nabumetone, fish oil, omeprazole, propranolol, ranitidine, ropinirole, senna, tamsulosin, tramadol, and zoledronic acid. Also is allergic to celebrex [celecoxib] and cyclobenzaprine.  Charles [name]  has a past medical history of Arthritis; AVM (arteriovenous malformation); Barrett's esophagus; BPH (benign prostatic hyperplasia); CAD (coronary artery disease); Cataract; Constipation; CPAP (continuous positive airway pressure) dependence; Depression; Diverticulosis; Diverticulosis; GERD (gastroesophageal reflux disease); Hyperlipidemia; Hypoglycemia; Lymphocytosis (06/2012); Mastodynia; Murmur; Osteoporosis; Osteoporosis; Prostate cancer ([place]); Sleep apnea; Urinary retention with incomplete bladder emptying; and Vitamin D deficiency. Also  has a past surgical  history that includes Upper gastrointestinal endoscopy; Repair spigelian hernia (2011); Inguinal hernia repair; back surgury; Spinal fusion (1996); Finger surgery (Right); Colonoscopy; Eye surgery (Bilateral); Back surgery (04/2013); Cataract extraction; Blepharoplasty (Bilateral, 02/23/15); Vasectomy; and Hardware Removal (Right, 05/15/2016).  Objective:   Vitals: BP 110/66 (BP Location: Right Arm, Patient Position: Sitting, Cuff Size: Small)   Pulse (!) 58   Temp 97.8 F (36.6 C) (Oral)   Resp 18   Ht 5\' 7"  (1.702 m)   Wt 183 lb (83 kg)   SpO2 97%   BMI 28.66 kg/m   Physical Exam  Constitutional: He is oriented to person, place, and time. He appears well-developed and well-nourished.  HENT:  Head: Normocephalic and atraumatic.  Eyes: Conjunctivae are normal.  Neck: Normal range of motion.  Pulmonary/Chest: Effort normal.  Musculoskeletal:       Feet:  Feet:  Right Foot:  Skin Integrity: Negative for callus.  Neurological: He is alert and oriented to person, place, and time.  Skin: Skin is warm and dry. Capillary refill takes less than 2 seconds.  Hyperkaratosis on most distal lateral aspect of right 4th digit with minimal skin maceration noted in the center. Well healed from prior unroofing procedure. Erythema and minimal warmth noted surrounding hyperkeratosis. 0.5 cm skin tear on medial aspect of right 4th digit from pt removing his bandaid. No purulent discharge or induration noted.   Hyperkeratosis noted on most lateral aspect of right big toe.   Psychiatric: He has a normal mood and affect.  Vitals reviewed.   No results found for this or any previous visit (from the past 24 hour(s)).  Assessment and Plan :  1. Skin infection -Due to physical exam findings, will treat for early cellulitis as patient has a difficult time managing care of his feet. - cephALEXin (  KEFLEX) 500 MG capsule; Take 1 capsule (500 mg total) by mouth 4 (four) times daily.  Dispense: 20 capsule;  Refill: 0 -Pt to follow up in one week for reevaluation or surrounding erythema and warmth.  2. Corns and callus -Instructed to continue using bactroban and nonadherent bandage over right 4th digit, avoid bandaids.  -Once callus on right 4th digit has healed completely, instructed he can use OTC corn pads. Can also use corn pads over big toe callus.  -Thoroughly discussed the importance of seeing podiatry for further management and care of his feet. He agreed to talk to his wife about this and we will make the referral after he has that discussion (either later today or at his next visit in one week).   Charles Delaine, PA-C  [facility] 09/29/2016 8:19 AM

## 2016-10-06 ENCOUNTER — Ambulatory Visit (INDEPENDENT_AMBULATORY_CARE_PROVIDER_SITE_OTHER): Payer: Medicare Other | Admitting: Physician Assistant

## 2016-10-06 VITALS — BP 118/70 | HR 62 | Temp 97.7°F | Resp 18 | Ht 67.0 in | Wt 183.2 lb

## 2016-10-06 DIAGNOSIS — L97511 Non-pressure chronic ulcer of other part of right foot limited to breakdown of skin: Secondary | ICD-10-CM

## 2016-10-06 NOTE — Progress Notes (Signed)
10/06/2016 8:34 AM   DOB: [birth year] / MRN: [id number]  SUBJECTIVE:  Charles [name] is a 82 y.o. male presenting for follow up of sores on his feet.  Most recent HPI by PA Timmothy Euler as follows:   "Charles [name] is a 82 y.o. male presenting for follow up on wound on fourth digit of right toe. Pt initially seen on 09/22/16 for sore on right 4th digit. During this visit, several layers of hyperkeratotic skin were unroofed with 15 blade. Pt instructed to use bactroban ointment and keep it clean with soap and water.   Since this visit, pt has been cleaning it appropriately and using the ointment. He was covering it up with nonadherent dressing but did use a bandaid once so it ripped surrounding skin off. He now has some redness in the 4th digit. Denies pus drainage, pain, fever, and chills.  Of note, pt has a hard time tending to the toe because he has a back fusion and cannot bend forward fully. He would like to consider podiatry referall at this time but wants to discuss it with his wife before we put in the reeferral."  Today he reports the toe is doing somewhat better, however some tenderness remains.  He has been applying copious ointment and changing the dressing daily.  He has completed Keflex.  Denies painful ambulation, a history of diabetes, fever, nausea, swelling.  He is here today because his wife wants him to see a podiatrist as she wants him to have be best possible care for his feet.     He is allergic to celebrex [celecoxib] and cyclobenzaprine.   He  has a past medical history of Arthritis; AVM (arteriovenous malformation); Barrett's esophagus; BPH (benign prostatic hyperplasia); CAD (coronary artery disease); Cataract; Constipation; CPAP (continuous positive airway pressure) dependence; Depression; Diverticulosis; Diverticulosis; GERD (gastroesophageal reflux disease); Hyperlipidemia; Hypoglycemia; Lymphocytosis (06/2012); Mastodynia; Murmur; Osteoporosis; Osteoporosis;  Prostate cancer ([place]); Sleep apnea; Urinary retention with incomplete bladder emptying; and Vitamin D deficiency.    He  reports that he has never smoked. He has never used smokeless tobacco. He reports that he does not drink alcohol or use drugs. He  reports that he does not engage in sexual activity. The patient  has a past surgical history that includes Upper gastrointestinal endoscopy; Repair spigelian hernia (2011); Inguinal hernia repair; back surgury; Spinal fusion (1996); Finger surgery (Right); Colonoscopy; Eye surgery (Bilateral); Back surgery (04/2013); Cataract extraction; Blepharoplasty (Bilateral, 02/23/15); Vasectomy; and Hardware Removal (Right, 05/15/2016).  His family history includes Arthritis-Osteo in his brother; CAD in his mother; Prostate cancer in his father.  Review of Systems  Constitutional: Negative for chills and fever.  Eyes: Negative for blurred vision.  Gastrointestinal: Negative for nausea.  Musculoskeletal: Negative for myalgias.  Neurological: Negative for dizziness, tingling and headaches.  Psychiatric/Behavioral: Negative for depression. The patient is not nervous/anxious.     The problem list and medications were reviewed and updated by myself where necessary and exist elsewhere in the encounter.   OBJECTIVE:  BP 118/70   Pulse 62   Temp 97.7 F (36.5 C) (Oral)   Resp 18   Ht 5\' 7"  (1.702 m)   Wt 183 lb 3.2 oz (83.1 kg)   SpO2 96%   BMI 28.69 kg/m   Physical Exam  Cardiovascular: Normal rate and regular rhythm.   Pulses:      Dorsalis pedis pulses are 2+ on the right side.       Posterior tibial pulses are 2+ on  the right side.  Pulmonary/Chest: Effort normal and breath sounds normal.  Musculoskeletal:       Feet:  Skin: Skin is warm and dry.  Psychiatric: He has a normal mood and affect.    No results found for this or any previous visit (from the past 72 hour(s)).  No results found.  ASSESSMENT AND PLAN  Andrw was seen today for  follow-up.  Diagnoses and all orders for this visit:  Skin ulcer of toe of right foot, limited to breakdown of skin ([place]): The wound appears to be healing.  However he reports a history of numbness in his feet secondary to a history of spinal fusion and has difficulty caring for his feet.  He would benefit from specialist consult.   -     Ambulatory referral to Podiatry    The patient is advised to call or return to clinic if he does not see an improvement in symptoms, or to seek the care of the closest emergency department if he worsens with the above plan.   Philis Fendt, MHS, PA-C [facility] 10/06/2016 8:34 AM

## 2016-10-06 NOTE — Patient Instructions (Signed)
     IF you received an x-ray today, you will receive an invoice from [facility]. Please contact [facility] at [phone] with questions or concerns regarding your invoice.   IF you received labwork today, you will receive an invoice from [facility]. Please contact [facility] at [phone] with questions or concerns regarding your invoice.   Our billing staff will not be able to assist you with questions regarding bills from these companies.  You will be contacted with the lab results as soon as they are available. The fastest way to get your results is to activate your My Chart account. Instructions are located on the last page of this paperwork. If you have not heard from us regarding the results in 2 weeks, please contact this office.      

## 2016-10-09 NOTE — Progress Notes (Addendum)
  Radiation Oncology         [phone] ________________________________  Name: Charles [name] MRN: [id number]  Date: 10/12/2016  DOB: 1931-05-21  SIMULATION AND TREATMENT PLANNING NOTE    ICD-9-CM ICD-10-CM   1. Malignant neoplasm of prostate ([place]) 185 C61     DIAGNOSIS:  82 y.o. gentleman with stage T1c adenocarcinoma of the prostate with a Gleason's score of 4+4 and a PSA of 4.82  NARRATIVE:  The patient was brought to the [facility].  Identity was confirmed.  All relevant records and images related to the planned course of therapy were reviewed.  The patient freely provided informed written consent to proceed with treatment after reviewing the details related to the planned course of therapy. The consent form was witnessed and verified by the simulation staff.  Then, the patient was set-up in a stable reproducible supine position for radiation therapy.  A vacuum lock pillow device was custom fabricated to position his legs in a reproducible immobilized position.  Then, I performed a urethrogram under sterile conditions to identify the prostatic apex.  CT images were obtained.  Surface markings were placed.  The CT images were loaded into the planning software.  Then the prostate target and avoidance structures including the rectum, bladder, bowel and hips were contoured.  Treatment planning then occurred.  The radiation prescription was entered and confirmed.  A total of one complex treatment devices were fabricated. I have requested : Intensity Modulated Radiotherapy (IMRT) is medically necessary for this case for the following reason:  Rectal sparing.Marland Kitchen  PLAN:  The patient will receive 75 Gy in 40 fractions with 45 Gy to lymph nodes and then boost prostate.  ________________________________  [name]. [name], M.D.    ADDENDUM:  Rectal filling on sim imagine is excessive.  Will repeat planning CT to assess whether reproducible or transient.

## 2016-10-10 DIAGNOSIS — Z85828 Personal history of other malignant neoplasm of skin: Secondary | ICD-10-CM | POA: Diagnosis not present

## 2016-10-10 DIAGNOSIS — L570 Actinic keratosis: Secondary | ICD-10-CM | POA: Diagnosis not present

## 2016-10-10 DIAGNOSIS — D485 Neoplasm of uncertain behavior of skin: Secondary | ICD-10-CM | POA: Diagnosis not present

## 2016-10-12 ENCOUNTER — Ambulatory Visit
Admission: RE | Admit: 2016-10-12 | Discharge: 2016-10-12 | Disposition: A | Discharge status: Home / Self Care | Payer: Medicare Other | Source: Ambulatory Visit | Attending: Radiation Oncology | Admitting: Radiation Oncology

## 2016-10-12 ENCOUNTER — Encounter: Payer: Self-pay | Admitting: Medical Oncology

## 2016-10-12 DIAGNOSIS — F329 Major depressive disorder, single episode, unspecified: Secondary | ICD-10-CM | POA: Diagnosis not present

## 2016-10-12 DIAGNOSIS — K2270 Barrett's esophagus without dysplasia: Secondary | ICD-10-CM | POA: Diagnosis not present

## 2016-10-12 DIAGNOSIS — Z7982 Long term (current) use of aspirin: Secondary | ICD-10-CM | POA: Diagnosis not present

## 2016-10-12 DIAGNOSIS — E559 Vitamin D deficiency, unspecified: Secondary | ICD-10-CM | POA: Diagnosis not present

## 2016-10-12 DIAGNOSIS — G4730 Sleep apnea, unspecified: Secondary | ICD-10-CM | POA: Diagnosis not present

## 2016-10-12 DIAGNOSIS — I2510 Atherosclerotic heart disease of native coronary artery without angina pectoris: Secondary | ICD-10-CM | POA: Diagnosis not present

## 2016-10-12 DIAGNOSIS — K219 Gastro-esophageal reflux disease without esophagitis: Secondary | ICD-10-CM | POA: Diagnosis not present

## 2016-10-12 DIAGNOSIS — N400 Enlarged prostate without lower urinary tract symptoms: Secondary | ICD-10-CM | POA: Diagnosis not present

## 2016-10-12 DIAGNOSIS — C61 Malignant neoplasm of prostate: Secondary | ICD-10-CM

## 2016-10-12 DIAGNOSIS — M542 Cervicalgia: Secondary | ICD-10-CM | POA: Diagnosis not present

## 2016-10-12 DIAGNOSIS — E785 Hyperlipidemia, unspecified: Secondary | ICD-10-CM | POA: Diagnosis not present

## 2016-10-13 ENCOUNTER — Ambulatory Visit
Admission: RE | Admit: 2016-10-13 | Discharge: 2016-10-13 | Disposition: A | Discharge status: Home / Self Care | Payer: Medicare Other | Source: Ambulatory Visit | Attending: Radiation Oncology | Admitting: Radiation Oncology

## 2016-10-13 DIAGNOSIS — C61 Malignant neoplasm of prostate: Secondary | ICD-10-CM | POA: Diagnosis not present

## 2016-10-13 DIAGNOSIS — Z510 Encounter for antineoplastic radiation therapy: Secondary | ICD-10-CM | POA: Insufficient documentation

## 2016-10-15 NOTE — Progress Notes (Signed)
Repeat CT shows similar large volume rectal filling, but, with less prostate distortion.  Will proceed as planned.

## 2016-10-18 ENCOUNTER — Ambulatory Visit (INDEPENDENT_AMBULATORY_CARE_PROVIDER_SITE_OTHER): Payer: Medicare Other | Admitting: Podiatry

## 2016-10-18 ENCOUNTER — Encounter: Payer: Self-pay | Admitting: Podiatry

## 2016-10-18 VITALS — BP 110/60 | HR 62 | Temp 97.6°F | Resp 16 | Ht 67.0 in | Wt 174.0 lb

## 2016-10-18 DIAGNOSIS — L84 Corns and callosities: Secondary | ICD-10-CM | POA: Diagnosis not present

## 2016-10-18 DIAGNOSIS — M2041 Other hammer toe(s) (acquired), right foot: Secondary | ICD-10-CM

## 2016-10-18 NOTE — Progress Notes (Signed)
   Subjective:    Patient ID: Charles [name], male    DOB: 1931/11/20, 82 y.o.   MRN: [id number]  HPI    Review of Systems  HENT: Positive for sneezing.   Hematological: Bruises/bleeds easily.  All other systems reviewed and are negative.      Objective:   Physical Exam        Assessment & Plan:

## 2016-10-19 DIAGNOSIS — Z510 Encounter for antineoplastic radiation therapy: Secondary | ICD-10-CM | POA: Diagnosis not present

## 2016-10-19 DIAGNOSIS — C61 Malignant neoplasm of prostate: Secondary | ICD-10-CM | POA: Diagnosis not present

## 2016-10-19 NOTE — Progress Notes (Signed)
Subjective:     Patient ID: Charles [name], male   DOB: 08-18-1931, 82 y.o.   MRN: [id number]  HPI patient presents with caregiver stating that he has pain at the end of this toe and he's had a treated previously but it's not giving him relief and he has never had a trim properly. States it's been present for several months   Review of Systems  All other systems reviewed and are negative.      Objective:   Physical Exam  Constitutional: He is oriented to person, place, and time.  Cardiovascular: Intact distal pulses.   Musculoskeletal: Normal range of motion.  Neurological: He is oriented to person, place, and time.  Skin: Skin is warm.  Nursing note and vitals reviewed.  Neurovascular status intact muscle strength adequate range of motion within normal limits with patient noted to have a keratotic lesion distal third right localized in nature with no proximal edema erythema or drainage noted area it is painful when palpated but does not have any break in the skin at the current time and patient was noted to have good digital perfusion and well oriented     Assessment:     Keratotic lesion secondary to structural hammertoe deformity with pain when palpated with no current indication of ulceration    Plan:     H&P x-ray reviewed and today debridement accomplished with sharp sterile his mentation with no drainage noted buttress pad applied and instructed on padding the area. If anything were to change or if symptoms persist we may have to consider hammertoe and I did discuss digital procedure that might be necessary  X-ray report was negative for signs of lysis with structural deformity noted

## 2016-10-20 DIAGNOSIS — Z510 Encounter for antineoplastic radiation therapy: Secondary | ICD-10-CM | POA: Diagnosis not present

## 2016-10-20 DIAGNOSIS — C61 Malignant neoplasm of prostate: Secondary | ICD-10-CM | POA: Diagnosis not present

## 2016-10-23 ENCOUNTER — Ambulatory Visit
Admission: RE | Admit: 2016-10-23 | Discharge: 2016-10-23 | Disposition: A | Discharge status: Home / Self Care | Payer: Medicare Other | Source: Ambulatory Visit | Attending: Radiation Oncology | Admitting: Radiation Oncology

## 2016-10-23 DIAGNOSIS — C61 Malignant neoplasm of prostate: Secondary | ICD-10-CM | POA: Diagnosis not present

## 2016-10-23 DIAGNOSIS — Z510 Encounter for antineoplastic radiation therapy: Secondary | ICD-10-CM | POA: Diagnosis not present

## 2016-10-24 ENCOUNTER — Ambulatory Visit
Admission: RE | Admit: 2016-10-24 | Discharge: 2016-10-24 | Disposition: A | Discharge status: Home / Self Care | Payer: Medicare Other | Source: Ambulatory Visit | Attending: Radiation Oncology | Admitting: Radiation Oncology

## 2016-10-24 DIAGNOSIS — Z510 Encounter for antineoplastic radiation therapy: Secondary | ICD-10-CM | POA: Diagnosis not present

## 2016-10-24 DIAGNOSIS — C61 Malignant neoplasm of prostate: Secondary | ICD-10-CM | POA: Diagnosis not present

## 2016-10-25 ENCOUNTER — Ambulatory Visit
Admission: RE | Admit: 2016-10-25 | Discharge: 2016-10-25 | Disposition: A | Discharge status: Home / Self Care | Payer: Medicare Other | Source: Ambulatory Visit | Attending: Radiation Oncology | Admitting: Radiation Oncology

## 2016-10-25 ENCOUNTER — Encounter: Payer: Self-pay | Admitting: Medical Oncology

## 2016-10-25 ENCOUNTER — Encounter: Payer: Self-pay | Admitting: Radiation Oncology

## 2016-10-25 VITALS — BP 130/53 | HR 57 | Temp 97.9°F | Resp 18 | Ht 67.0 in | Wt 179.0 lb

## 2016-10-25 DIAGNOSIS — C61 Malignant neoplasm of prostate: Secondary | ICD-10-CM

## 2016-10-25 DIAGNOSIS — Z510 Encounter for antineoplastic radiation therapy: Secondary | ICD-10-CM | POA: Diagnosis not present

## 2016-10-25 NOTE — Progress Notes (Addendum)
Weight and vital signs are stable. Pt here for patient teaching.  Pt given Radiation and You booklet.  Reviewed areas of pertinence such as diarrhea and fatigue,urinary frequency,urinary urgency and urinary burning . Pt able to give teach back of have Imodium on hand and drink plenty of water. Reports nocturia x 1.  Denies dysuria , hematuria,diarrhea fatigueand urinary frequency.  Reports he is emptying his bladder with a weak to strong urinary stream.  See documentation in education area of the chart. Wt Readings from Last 3 Encounters:  10/25/16 179 lb (81.2 kg)  10/18/16 174 lb (78.9 kg)  10/06/16 183 lb 3.2 oz (83.1 kg)  BP (!) 130/53   Pulse (!) 57   Temp 97.9 F (36.6 C) (Oral)   Resp 18   Ht 5\' 7"  (1.702 m)   Wt 179 lb (81.2 kg)   SpO2 95%   BMI 28.04 kg/m

## 2016-10-25 NOTE — Progress Notes (Signed)
  Radiation Oncology         [phone]   Name: Charles [name] MRN: [id number]   Date: 10/25/2016  DOB: 12-03-1931     Weekly Radiation Therapy Management    ICD-9-CM ICD-10-CM   1. Malignant neoplasm of prostate ([facility]) 185 C61     Current Dose: 5.4 Gy  Planned Dose:  45 Gy  Narrative The patient presents for routine under treatment assessment.  Weight and vital signs are stable. Patient here for patient teaching.  Patient given Radiation and You booklet by nursing staff with teaching. Reports nocturia x 1.  Denies dysuria, hematuria, diarrhea, fatigue and urinary frequency.  Reports he is emptying his bladder with a weak to strong urinary stream.    The patient is without complaint. Set-up films were reviewed. The chart was checked.  Physical Findings  height is 5\' 7"  (1.702 m) and weight is 179 lb (81.2 kg). His oral temperature is 97.9 F (36.6 C). His blood pressure is 130/53 (abnormal) and his pulse is 57 (abnormal). His respiration is 18 and oxygen saturation is 95%. . Weight essentially stable.  Alert and in no acute distress. No significant changes.  Impression The patient is tolerating radiation. We will continue to monitor rectal filling.   Plan Continue treatment as planned.         [name] [name], M.D.  This document serves as a record of services personally performed by [name], MD. It was created on his behalf by Arlyce Harman, a trained medical scribe. The creation of this record is based on the scribe's personal observations and the provider's statements to them. This document has been checked and approved by the attending provider.

## 2016-10-27 ENCOUNTER — Ambulatory Visit: Payer: Medicare Other

## 2016-10-30 ENCOUNTER — Ambulatory Visit
Admission: RE | Admit: 2016-10-30 | Discharge: 2016-10-30 | Disposition: A | Discharge status: Home / Self Care | Payer: Medicare Other | Source: Ambulatory Visit | Attending: Radiation Oncology | Admitting: Radiation Oncology

## 2016-10-30 DIAGNOSIS — Z510 Encounter for antineoplastic radiation therapy: Secondary | ICD-10-CM | POA: Diagnosis not present

## 2016-10-30 DIAGNOSIS — C61 Malignant neoplasm of prostate: Secondary | ICD-10-CM | POA: Diagnosis not present

## 2016-10-31 ENCOUNTER — Ambulatory Visit
Admission: RE | Admit: 2016-10-31 | Discharge: 2016-10-31 | Disposition: A | Discharge status: Home / Self Care | Payer: Medicare Other | Source: Ambulatory Visit | Attending: Radiation Oncology | Admitting: Radiation Oncology

## 2016-10-31 DIAGNOSIS — Z510 Encounter for antineoplastic radiation therapy: Secondary | ICD-10-CM | POA: Diagnosis not present

## 2016-10-31 DIAGNOSIS — C61 Malignant neoplasm of prostate: Secondary | ICD-10-CM | POA: Diagnosis not present

## 2016-11-01 ENCOUNTER — Ambulatory Visit
Admission: RE | Admit: 2016-11-01 | Discharge: 2016-11-01 | Disposition: A | Discharge status: Home / Self Care | Payer: Medicare Other | Source: Ambulatory Visit | Attending: Radiation Oncology | Admitting: Radiation Oncology

## 2016-11-01 DIAGNOSIS — Z510 Encounter for antineoplastic radiation therapy: Secondary | ICD-10-CM | POA: Diagnosis not present

## 2016-11-01 DIAGNOSIS — C61 Malignant neoplasm of prostate: Secondary | ICD-10-CM | POA: Diagnosis not present

## 2016-11-02 ENCOUNTER — Ambulatory Visit
Admission: RE | Admit: 2016-11-02 | Discharge: 2016-11-02 | Disposition: A | Discharge status: Home / Self Care | Payer: Medicare Other | Source: Ambulatory Visit | Attending: Radiation Oncology | Admitting: Radiation Oncology

## 2016-11-02 DIAGNOSIS — C61 Malignant neoplasm of prostate: Secondary | ICD-10-CM | POA: Diagnosis not present

## 2016-11-02 DIAGNOSIS — Z510 Encounter for antineoplastic radiation therapy: Secondary | ICD-10-CM | POA: Diagnosis not present

## 2016-11-03 ENCOUNTER — Encounter: Payer: Self-pay | Admitting: Radiation Oncology

## 2016-11-03 ENCOUNTER — Ambulatory Visit
Admission: RE | Admit: 2016-11-03 | Discharge: 2016-11-03 | Disposition: A | Discharge status: Home / Self Care | Payer: Medicare Other | Source: Ambulatory Visit | Attending: Radiation Oncology | Admitting: Radiation Oncology

## 2016-11-03 VITALS — BP 146/107 | HR 55 | Temp 98.0°F | Resp 18 | Ht 67.0 in | Wt 182.6 lb

## 2016-11-03 DIAGNOSIS — C61 Malignant neoplasm of prostate: Secondary | ICD-10-CM | POA: Diagnosis not present

## 2016-11-03 DIAGNOSIS — Z510 Encounter for antineoplastic radiation therapy: Secondary | ICD-10-CM | POA: Diagnosis not present

## 2016-11-03 NOTE — Progress Notes (Addendum)
Weight and vital signs are stable. Denies diarrhea, fatigue,urinary urgency and urinary burning.  Reports nocturia x 2-3.   Reports he is emptying his bladder with a weak to strong urinary stream.  Having urinary frequency at times. Reports pelvic pain in the past three days 6/10 has been coming over the month, taking Tramadol. Wt Readings from Last 3 Encounters:  11/03/16 182 lb 9.6 oz (82.8 kg)  10/25/16 179 lb (81.2 kg)  10/18/16 174 lb (78.9 kg)  BP (!) 146/107   Pulse (!) 55   Temp 98 F (36.7 C) (Oral)   Resp 18   Ht 5\' 7"  (1.702 m)   Wt 182 lb 9.6 oz (82.8 kg)   SpO2 (!) 18%   BMI 28.60 kg/m

## 2016-11-03 NOTE — Progress Notes (Signed)
  Radiation Oncology         [phone]   Name: Charles [name] MRN: [id number]   Date: 11/03/2016  DOB: 24-Apr-1931     Weekly Radiation Therapy Management    ICD-9-CM ICD-10-CM   1. Malignant neoplasm of prostate ([facility]) 185 C61     Current Dose: 14.4 Gy  Planned Dose:  45 Gy  Narrative The patient presents for routine under treatment assessment.  Denies diarrhea, fatigue, urinary urgency, or dysuria.  Reports nocturia x 2-3.  Reports he is emptying his bladder with a weak to strong urinary stream. Has urinary frequency at times. Reports pelvic pain for the past three days as a 6/10. He is taking Tramadol.  Set-up films were reviewed. The chart was checked.  Physical Findings  height is 5\' 7"  (1.702 m) and weight is 182 lb 9.6 oz (82.8 kg). His oral temperature is 98 F (36.7 C). His blood pressure is 146/107 (abnormal) and his pulse is 55 (abnormal). His respiration is 18 and oxygen saturation is 18% (abnormal). . Weight essentially stable.  Alert and in no acute distress. No significant changes.  Impression The patient is tolerating radiation. We will continue to monitor rectal filling. The patient's pelvic pain could be caused by inflammation by radiation or lying down on the hard treatment exacerbating degenerative changes in the spine.  Plan Continue treatment as planned. The patient is taking Tramadol once a day. I encouraged him to take Tramadol once in the morning and evening to help with the pain. He could also use ice packs.         [name] [name], M.D.  This document serves as a record of services personally performed by [name], MD. It was created on his behalf by Darcus Austin, a trained medical scribe. The creation of this record is based on the scribe's personal observations and the provider's statements to them. This document has been checked and approved by the attending provider.

## 2016-11-06 ENCOUNTER — Ambulatory Visit
Admission: RE | Admit: 2016-11-06 | Discharge: 2016-11-06 | Disposition: A | Discharge status: Home / Self Care | Payer: Medicare Other | Source: Ambulatory Visit | Attending: Radiation Oncology | Admitting: Radiation Oncology

## 2016-11-06 DIAGNOSIS — C61 Malignant neoplasm of prostate: Secondary | ICD-10-CM | POA: Diagnosis not present

## 2016-11-06 DIAGNOSIS — Z510 Encounter for antineoplastic radiation therapy: Secondary | ICD-10-CM | POA: Diagnosis not present

## 2016-11-07 ENCOUNTER — Ambulatory Visit
Admission: RE | Admit: 2016-11-07 | Discharge: 2016-11-07 | Disposition: A | Discharge status: Home / Self Care | Payer: Medicare Other | Source: Ambulatory Visit | Attending: Radiation Oncology | Admitting: Radiation Oncology

## 2016-11-07 DIAGNOSIS — Z510 Encounter for antineoplastic radiation therapy: Secondary | ICD-10-CM | POA: Diagnosis not present

## 2016-11-07 DIAGNOSIS — C61 Malignant neoplasm of prostate: Secondary | ICD-10-CM | POA: Diagnosis not present

## 2016-11-08 ENCOUNTER — Ambulatory Visit
Admission: RE | Admit: 2016-11-08 | Discharge: 2016-11-08 | Disposition: A | Discharge status: Home / Self Care | Payer: Medicare Other | Source: Ambulatory Visit | Attending: Radiation Oncology | Admitting: Radiation Oncology

## 2016-11-08 DIAGNOSIS — Z510 Encounter for antineoplastic radiation therapy: Secondary | ICD-10-CM | POA: Diagnosis not present

## 2016-11-08 DIAGNOSIS — C61 Malignant neoplasm of prostate: Secondary | ICD-10-CM | POA: Diagnosis not present

## 2016-11-09 ENCOUNTER — Ambulatory Visit
Admission: RE | Admit: 2016-11-09 | Discharge: 2016-11-09 | Disposition: A | Discharge status: Home / Self Care | Payer: Medicare Other | Source: Ambulatory Visit | Attending: Radiation Oncology | Admitting: Radiation Oncology

## 2016-11-09 VITALS — BP 142/65 | HR 58 | Resp 18 | Wt 181.8 lb

## 2016-11-09 DIAGNOSIS — C61 Malignant neoplasm of prostate: Secondary | ICD-10-CM

## 2016-11-09 DIAGNOSIS — Z510 Encounter for antineoplastic radiation therapy: Secondary | ICD-10-CM | POA: Diagnosis not present

## 2016-11-09 NOTE — Progress Notes (Signed)
  Radiation Oncology         [phone]   Name: Charles [name] MRN: [id number]   Date: 11/09/2016  DOB: 1931-11-18     Weekly Radiation Therapy Management    ICD-9-CM ICD-10-CM   1. Malignant neoplasm of prostate ([facility]) 185 C61     Current Dose: 21.6 Gy  Planned Dose:  75 Gy  Narrative The patient presents for routine under treatment assessment.  Weight and vitals stable. Reports increased pain at SI joint. Denies dysuria or hematuria. Reports nocturia x 2-3. Denies urgency. Denies leakage or incontinence. Reports urinary frequency. Denies difficulty emptying his bladder. Managed diarrhea with Imodium AD. Reports fatigue. He is taking flomax.   Set-up films were reviewed. The chart was checked.  Physical Findings  weight is 181 lb 12.8 oz (82.5 kg). His blood pressure is 142/65 (abnormal) and his pulse is 58 (abnormal). His respiration is 18 and oxygen saturation is 100%. . Weight essentially stable.  Alert and in no acute distress. No significant changes.  Impression The patient is tolerating radiation. We will continue to monitor rectal filling. The patient's pelvic pain could be caused by inflammation by radiation or lying down on the hard treatment table exacerbating degenerative changes in the spine.  Plan Continue treatment as planned.        Charles [name], M.D.  This document serves as a record of services personally performed by Charles Pita, MD. It was created on his behalf by Charles [name], a trained medical scribe. The creation of this record is based on the scribe's personal observations and the provider's statements to them. This document has been checked and approved by the attending provider.

## 2016-11-09 NOTE — Progress Notes (Signed)
Weight and vitals stable. Reports increased pain at SI joint. Denies dysuria or hematuria. Reports nocturia x 2-3. Denies urgency. Denies leakage or incontinence. Reports urinary frequency. Denies difficulty emptying his bladder. Managed diarrhea with Imodium AD. Reports fatigue.  BP (!) 142/65 (BP Location: Left Arm, Patient Position: Sitting, Cuff Size: Normal)   Pulse (!) 58   Resp 18   Wt 181 lb 12.8 oz (82.5 kg)   SpO2 100%   BMI 28.47 kg/m  Wt Readings from Last 3 Encounters:  11/09/16 181 lb 12.8 oz (82.5 kg)  11/03/16 182 lb 9.6 oz (82.8 kg)  10/25/16 179 lb (81.2 kg)

## 2016-11-10 ENCOUNTER — Ambulatory Visit
Admission: RE | Admit: 2016-11-10 | Discharge: 2016-11-10 | Disposition: A | Discharge status: Home / Self Care | Payer: Medicare Other | Source: Ambulatory Visit | Attending: Radiation Oncology | Admitting: Radiation Oncology

## 2016-11-10 DIAGNOSIS — Z510 Encounter for antineoplastic radiation therapy: Secondary | ICD-10-CM | POA: Diagnosis not present

## 2016-11-10 DIAGNOSIS — C61 Malignant neoplasm of prostate: Secondary | ICD-10-CM | POA: Diagnosis not present

## 2016-11-13 ENCOUNTER — Ambulatory Visit
Admission: RE | Admit: 2016-11-13 | Discharge: 2016-11-13 | Disposition: A | Discharge status: Home / Self Care | Payer: Medicare Other | Source: Ambulatory Visit | Attending: Radiation Oncology | Admitting: Radiation Oncology

## 2016-11-13 ENCOUNTER — Encounter: Payer: Self-pay | Admitting: Medical Oncology

## 2016-11-13 DIAGNOSIS — Z510 Encounter for antineoplastic radiation therapy: Secondary | ICD-10-CM | POA: Diagnosis not present

## 2016-11-13 DIAGNOSIS — C61 Malignant neoplasm of prostate: Secondary | ICD-10-CM | POA: Diagnosis not present

## 2016-11-13 NOTE — Progress Notes (Signed)
Charles [name] states he is doing well with radiation. He states that his arthritis is worse since he started radiation. He does take Tramadol which helps. He states some mild diarrhea that imodium took care of. I will continue to follow.

## 2016-11-13 NOTE — Progress Notes (Signed)
Nav 

## 2016-11-14 ENCOUNTER — Ambulatory Visit
Admission: RE | Admit: 2016-11-14 | Discharge: 2016-11-14 | Disposition: A | Discharge status: Home / Self Care | Payer: Medicare Other | Source: Ambulatory Visit | Attending: Radiation Oncology | Admitting: Radiation Oncology

## 2016-11-14 DIAGNOSIS — C61 Malignant neoplasm of prostate: Secondary | ICD-10-CM | POA: Diagnosis not present

## 2016-11-14 DIAGNOSIS — Z510 Encounter for antineoplastic radiation therapy: Secondary | ICD-10-CM | POA: Diagnosis not present

## 2016-11-15 ENCOUNTER — Ambulatory Visit
Admission: RE | Admit: 2016-11-15 | Discharge: 2016-11-15 | Disposition: A | Discharge status: Home / Self Care | Payer: Medicare Other | Source: Ambulatory Visit | Attending: Radiation Oncology | Admitting: Radiation Oncology

## 2016-11-15 DIAGNOSIS — C61 Malignant neoplasm of prostate: Secondary | ICD-10-CM | POA: Diagnosis not present

## 2016-11-15 DIAGNOSIS — Z510 Encounter for antineoplastic radiation therapy: Secondary | ICD-10-CM | POA: Diagnosis not present

## 2016-11-16 ENCOUNTER — Ambulatory Visit
Admission: RE | Admit: 2016-11-16 | Discharge: 2016-11-16 | Disposition: A | Discharge status: Home / Self Care | Payer: Medicare Other | Source: Ambulatory Visit | Attending: Radiation Oncology | Admitting: Radiation Oncology

## 2016-11-16 DIAGNOSIS — C61 Malignant neoplasm of prostate: Secondary | ICD-10-CM | POA: Diagnosis not present

## 2016-11-16 DIAGNOSIS — Z510 Encounter for antineoplastic radiation therapy: Secondary | ICD-10-CM | POA: Diagnosis not present

## 2016-11-17 ENCOUNTER — Ambulatory Visit
Admission: RE | Admit: 2016-11-17 | Discharge: 2016-11-17 | Disposition: A | Discharge status: Home / Self Care | Payer: Medicare Other | Source: Ambulatory Visit | Attending: Radiation Oncology | Admitting: Radiation Oncology

## 2016-11-17 ENCOUNTER — Encounter: Payer: Self-pay | Admitting: Radiation Oncology

## 2016-11-17 VITALS — BP 146/63 | HR 60 | Temp 97.8°F | Resp 16 | Wt 183.2 lb

## 2016-11-17 DIAGNOSIS — Z510 Encounter for antineoplastic radiation therapy: Secondary | ICD-10-CM | POA: Diagnosis not present

## 2016-11-17 DIAGNOSIS — C61 Malignant neoplasm of prostate: Secondary | ICD-10-CM

## 2016-11-17 NOTE — Progress Notes (Addendum)
Weekly rad txs prostate 18/40 completed, nocturia x7, increased frequency/urgency, empties bladder fully, good stream  Sometimes, other times dribbles and drips stated,  No dysuria,no hematuria, bowels are regular now,  C/o pain lower back down leg,states sciatica pain, took med this am, no skin irritation stated, fatigue mild  10:02 AM BP (!) 146/63 (BP Location: Left Arm, Patient Position: Sitting, Cuff Size: Normal)   Pulse 60   Temp 97.8 F (36.6 C) (Oral)   Resp 16   Wt 183 lb 3.2 oz (83.1 kg)   BMI 28.69 kg/m   Wt Readings from Last 3 Encounters:  11/17/16 183 lb 3.2 oz (83.1 kg)  11/09/16 181 lb 12.8 oz (82.5 kg)  11/03/16 182 lb 9.6 oz (82.8 kg)

## 2016-11-17 NOTE — Progress Notes (Signed)
  Radiation Oncology         [phone]   Name: Charles [name] MRN: [id number]   Date: 11/17/2016  DOB: [birth year]     Weekly Radiation Therapy Management    ICD-9-CM ICD-10-CM   1. Malignant neoplasm of prostate (HCC) 185 C61     Current Dose: 32.4 Gy  Planned Dose:  75 Gy  Narrative The patient presents for routine under treatment assessment.  Weekly rad txs prostate 18/40 completed. Nocturia x7, increased frequency/urgency, empties bladder fully, good stream sometimes and other times it dribbles and drips. Denies dysuria or hematuria. Bowels are regular now,  C/o pain lower back down leg,states sciatica pain, took med this am, no skin irritation stated, fatigue mild.  Set-up films were reviewed. The chart was checked.  Physical Findings  weight is 183 lb 3.2 oz (83.1 kg). His oral temperature is 97.8 F (36.6 C). His blood pressure is 146/63 (abnormal) and his pulse is 60. His respiration is 16. . Weight essentially stable.  Alert and in no acute distress. No significant changes.  Impression The patient is tolerating radiation. We will continue to monitor rectal filling. The patient's pelvic pain could be caused by inflammation by radiation or lying down on the hard treatment table exacerbating degenerative changes in the spine.  Plan Continue treatment as planned.        [name] [name], M.D.  This document serves as a record of services personally performed by [name], MD. It was created on his behalf by Darcus Austin, a trained medical scribe. The creation of this record is based on the scribe's personal observations and the provider's statements to them. This document has been checked and approved by the attending provider.

## 2016-11-20 ENCOUNTER — Ambulatory Visit
Admission: RE | Admit: 2016-11-20 | Discharge: 2016-11-20 | Disposition: A | Discharge status: Home / Self Care | Payer: Medicare Other | Source: Ambulatory Visit | Attending: Radiation Oncology | Admitting: Radiation Oncology

## 2016-11-20 DIAGNOSIS — Z510 Encounter for antineoplastic radiation therapy: Secondary | ICD-10-CM | POA: Diagnosis not present

## 2016-11-20 DIAGNOSIS — C61 Malignant neoplasm of prostate: Secondary | ICD-10-CM | POA: Diagnosis not present

## 2016-11-21 ENCOUNTER — Ambulatory Visit
Admission: RE | Admit: 2016-11-21 | Discharge: 2016-11-21 | Disposition: A | Discharge status: Home / Self Care | Payer: Medicare Other | Source: Ambulatory Visit | Attending: Radiation Oncology | Admitting: Radiation Oncology

## 2016-11-21 DIAGNOSIS — C61 Malignant neoplasm of prostate: Secondary | ICD-10-CM | POA: Diagnosis not present

## 2016-11-21 DIAGNOSIS — Z510 Encounter for antineoplastic radiation therapy: Secondary | ICD-10-CM | POA: Diagnosis not present

## 2016-11-22 ENCOUNTER — Ambulatory Visit
Admission: RE | Admit: 2016-11-22 | Discharge: 2016-11-22 | Disposition: A | Discharge status: Home / Self Care | Payer: Medicare Other | Source: Ambulatory Visit | Attending: Radiation Oncology | Admitting: Radiation Oncology

## 2016-11-22 DIAGNOSIS — C61 Malignant neoplasm of prostate: Secondary | ICD-10-CM | POA: Diagnosis not present

## 2016-11-22 DIAGNOSIS — Z510 Encounter for antineoplastic radiation therapy: Secondary | ICD-10-CM | POA: Diagnosis not present

## 2016-11-23 ENCOUNTER — Ambulatory Visit
Admission: RE | Admit: 2016-11-23 | Discharge: 2016-11-23 | Disposition: A | Discharge status: Home / Self Care | Payer: Medicare Other | Source: Ambulatory Visit | Attending: Radiation Oncology | Admitting: Radiation Oncology

## 2016-11-23 DIAGNOSIS — C61 Malignant neoplasm of prostate: Secondary | ICD-10-CM | POA: Diagnosis not present

## 2016-11-23 DIAGNOSIS — Z510 Encounter for antineoplastic radiation therapy: Secondary | ICD-10-CM | POA: Diagnosis not present

## 2016-11-24 ENCOUNTER — Encounter: Payer: Self-pay | Admitting: Radiation Oncology

## 2016-11-24 ENCOUNTER — Ambulatory Visit
Admission: RE | Admit: 2016-11-24 | Discharge: 2016-11-24 | Disposition: A | Discharge status: Home / Self Care | Payer: Medicare Other | Source: Ambulatory Visit | Attending: Radiation Oncology | Admitting: Radiation Oncology

## 2016-11-24 VITALS — BP 115/54 | HR 60 | Temp 98.1°F | Resp 20 | Wt 179.8 lb

## 2016-11-24 DIAGNOSIS — Z510 Encounter for antineoplastic radiation therapy: Secondary | ICD-10-CM | POA: Diagnosis not present

## 2016-11-24 DIAGNOSIS — C61 Malignant neoplasm of prostate: Secondary | ICD-10-CM | POA: Diagnosis not present

## 2016-11-24 NOTE — Progress Notes (Signed)
Weekly rad txs  Prostate,23/40 completed,  nocturia several times stated, has difficulty starting his flow ,states"drips  And finally he goes and feels he empties his bladder,  No hematuria,  Low back/pelvis pain stated, appetite good bowel movements once a day, fatigued,  BP (!) 115/54 (BP Location: Right Arm, Patient Position: Sitting, Cuff Size: Normal)   Pulse 60   Temp 98.1 F (36.7 C) (Oral)   Resp 20   Wt 179 lb 12.8 oz (81.6 kg)   BMI 28.16 kg/m   Wt Readings from Last 3 Encounters:  11/24/16 179 lb 12.8 oz (81.6 kg)  11/17/16 183 lb 3.2 oz (83.1 kg)  11/09/16 181 lb 12.8 oz (82.5 kg)

## 2016-11-24 NOTE — Progress Notes (Signed)
  Radiation Oncology         [phone]   Name: Charles [name] MRN: [id number]   Date: 11/24/2016  DOB: [birth year]     Weekly Radiation Therapy Management    ICD-9-CM ICD-10-CM   1. Malignant neoplasm of prostate (HCC) 185 C61     Current Dose: 41.4 Gy  Planned Dose:  75 Gy  Narrative The patient presents for routine under treatment assessment.  Weekly rad txs prostate 23/40 completed. He reports nocturia several times a night. He has difficulty starting his flow and states it "drips" and then he can finally go, and he feels he empties his bladder. Denies hematuria. The patient reports low back and pelvic pain. He reports a good appetite. The patient is fatigued.  Set-up films were reviewed. The chart was checked.  Physical Findings  weight is 179 lb 12.8 oz (81.6 kg). His oral temperature is 98.1 F (36.7 C). His blood pressure is 115/54 (abnormal) and his pulse is 60. His respiration is 20. . Weight essentially stable.  Alert and in no acute distress. No significant changes.  Impression The patient is tolerating radiation. We will continue to monitor rectal filling. The patient's pelvic pain could be caused by inflammation by radiation or lying down on the hard treatment table exacerbating degenerative changes in the spine.  Plan Continue treatment as planned.        [name] [name], M.D.  This document serves as a record of services personally performed by [name], MD. It was created on his behalf by Maryla Morrow, a trained medical scribe. The creation of this record is based on the scribe's personal observations and the provider's statements to them. This document has been checked and approved by the attending provider.

## 2016-11-28 ENCOUNTER — Ambulatory Visit
Admission: RE | Admit: 2016-11-28 | Discharge: 2016-11-28 | Disposition: A | Discharge status: Home / Self Care | Payer: Medicare Other | Source: Ambulatory Visit | Attending: Radiation Oncology | Admitting: Radiation Oncology

## 2016-11-28 DIAGNOSIS — C61 Malignant neoplasm of prostate: Secondary | ICD-10-CM | POA: Diagnosis not present

## 2016-11-28 DIAGNOSIS — Z510 Encounter for antineoplastic radiation therapy: Secondary | ICD-10-CM | POA: Diagnosis not present

## 2016-11-29 ENCOUNTER — Ambulatory Visit
Admission: RE | Admit: 2016-11-29 | Discharge: 2016-11-29 | Disposition: A | Discharge status: Home / Self Care | Payer: Medicare Other | Source: Ambulatory Visit | Attending: Radiation Oncology | Admitting: Radiation Oncology

## 2016-11-29 DIAGNOSIS — C61 Malignant neoplasm of prostate: Secondary | ICD-10-CM | POA: Diagnosis not present

## 2016-11-29 DIAGNOSIS — Z510 Encounter for antineoplastic radiation therapy: Secondary | ICD-10-CM | POA: Diagnosis not present

## 2016-11-30 ENCOUNTER — Ambulatory Visit
Admission: RE | Admit: 2016-11-30 | Discharge: 2016-11-30 | Disposition: A | Discharge status: Home / Self Care | Payer: Medicare Other | Source: Ambulatory Visit | Attending: Radiation Oncology | Admitting: Radiation Oncology

## 2016-11-30 DIAGNOSIS — C61 Malignant neoplasm of prostate: Secondary | ICD-10-CM | POA: Diagnosis not present

## 2016-11-30 DIAGNOSIS — Z510 Encounter for antineoplastic radiation therapy: Secondary | ICD-10-CM | POA: Diagnosis not present

## 2016-12-01 ENCOUNTER — Ambulatory Visit
Admission: RE | Admit: 2016-12-01 | Discharge: 2016-12-01 | Disposition: A | Discharge status: Home / Self Care | Payer: Medicare Other | Source: Ambulatory Visit | Attending: Radiation Oncology | Admitting: Radiation Oncology

## 2016-12-01 VITALS — BP 126/56 | HR 60 | Resp 18 | Wt 182.4 lb

## 2016-12-01 DIAGNOSIS — C61 Malignant neoplasm of prostate: Secondary | ICD-10-CM | POA: Diagnosis not present

## 2016-12-01 DIAGNOSIS — Z510 Encounter for antineoplastic radiation therapy: Secondary | ICD-10-CM | POA: Diagnosis not present

## 2016-12-01 NOTE — Progress Notes (Signed)
  Radiation Oncology         [phone]   Name: Charles [name] MRN: [id number]   Date: 12/01/2016  DOB: 1931/10/03     Weekly Radiation Therapy Management    ICD-9-CM ICD-10-CM   1. Malignant neoplasm of prostate (HCC) 185 C61     Current Dose: 49 Gy  Planned Dose:  75 Gy  Narrative The patient presents for routine under treatment assessment.  Weekly rad txs prostate 27/40 completed. Weight and vitals stable. Reports low back pain 8/10 in severity related to effects of arthritis. Reports urinary frequency every 30 minutes during the day. Patient refers to this frequency urination as drip and dribble. Reports nocturia x 2-3. Reports mild discomfort at the end of each void. Denies hematuria. Reports urgency and occasional leakage. Denies diarrhea. Reports fatigue.   Set-up films were reviewed. The chart was checked.  Physical Findings  weight is 182 lb 6.4 oz (82.7 kg). His blood pressure is 126/56 (abnormal) and his pulse is 60. His respiration is 18 and oxygen saturation is 100%. . Weight essentially stable.  Alert and in no acute distress. No significant changes.  Impression The patient is tolerating radiation. The patient's pelvic pain could be caused by inflammation by radiation or lying down on the hard treatment table exacerbating degenerative changes in the spine.  Plan Continue treatment as planned.       ------------------------------------------------  Jodelle Gross, MD, PhD  This document serves as a record of services personally performed by Kyung Rudd, MD. It was created on his behalf by Maryla Morrow, a trained medical scribe. The creation of this record is based on the scribe's personal observations and the provider's statements to them. This document has been checked and approved by the attending provider.

## 2016-12-01 NOTE — Progress Notes (Signed)
Weight and vitals stable. Reports low back pain 8 on a scale of 0-10 related to effects of arthritis. Reports urinary frequency every 30 minutes during the day. Patient refers to this frequency urination as drip and dribble. Reports nocturia x 2-3. Reports mild discomfort at the end of each void. Denies hematuria. Reports urgency and occasional leakage. Denies diarrhea. Reports fatigue.   BP (!) 126/56 (BP Location: Left Arm, Patient Position: Sitting, Cuff Size: Normal)   Pulse 60   Resp 18   Wt 182 lb 6.4 oz (82.7 kg)   SpO2 100%   BMI 28.57 kg/m  Wt Readings from Last 3 Encounters:  12/01/16 182 lb 6.4 oz (82.7 kg)  11/24/16 179 lb 12.8 oz (81.6 kg)  11/17/16 183 lb 3.2 oz (83.1 kg)

## 2016-12-05 ENCOUNTER — Ambulatory Visit
Admission: RE | Admit: 2016-12-05 | Discharge: 2016-12-05 | Disposition: A | Discharge status: Home / Self Care | Payer: Medicare Other | Source: Ambulatory Visit | Attending: Radiation Oncology | Admitting: Radiation Oncology

## 2016-12-05 DIAGNOSIS — C61 Malignant neoplasm of prostate: Secondary | ICD-10-CM | POA: Diagnosis not present

## 2016-12-05 DIAGNOSIS — Z510 Encounter for antineoplastic radiation therapy: Secondary | ICD-10-CM | POA: Diagnosis not present

## 2016-12-06 ENCOUNTER — Ambulatory Visit
Admission: RE | Admit: 2016-12-06 | Discharge: 2016-12-06 | Disposition: A | Discharge status: Home / Self Care | Payer: Medicare Other | Source: Ambulatory Visit | Attending: Radiation Oncology | Admitting: Radiation Oncology

## 2016-12-06 DIAGNOSIS — Z510 Encounter for antineoplastic radiation therapy: Secondary | ICD-10-CM | POA: Diagnosis not present

## 2016-12-06 DIAGNOSIS — C61 Malignant neoplasm of prostate: Secondary | ICD-10-CM | POA: Diagnosis not present

## 2016-12-07 ENCOUNTER — Ambulatory Visit
Admission: RE | Admit: 2016-12-07 | Discharge: 2016-12-07 | Disposition: A | Discharge status: Home / Self Care | Payer: Medicare Other | Source: Ambulatory Visit | Attending: Radiation Oncology | Admitting: Radiation Oncology

## 2016-12-07 ENCOUNTER — Ambulatory Visit: Admission: RE | Admit: 2016-12-07 | Payer: Medicare Other | Source: Ambulatory Visit | Admitting: Radiation Oncology

## 2016-12-07 DIAGNOSIS — C61 Malignant neoplasm of prostate: Secondary | ICD-10-CM | POA: Diagnosis not present

## 2016-12-07 DIAGNOSIS — Z510 Encounter for antineoplastic radiation therapy: Secondary | ICD-10-CM | POA: Diagnosis not present

## 2016-12-08 ENCOUNTER — Ambulatory Visit
Admission: RE | Admit: 2016-12-08 | Discharge: 2016-12-08 | Disposition: A | Discharge status: Home / Self Care | Payer: Medicare Other | Source: Ambulatory Visit | Attending: Radiation Oncology | Admitting: Radiation Oncology

## 2016-12-08 DIAGNOSIS — Z510 Encounter for antineoplastic radiation therapy: Secondary | ICD-10-CM | POA: Diagnosis not present

## 2016-12-08 DIAGNOSIS — C61 Malignant neoplasm of prostate: Secondary | ICD-10-CM | POA: Diagnosis not present

## 2016-12-11 ENCOUNTER — Ambulatory Visit
Admission: RE | Admit: 2016-12-11 | Discharge: 2016-12-11 | Disposition: A | Discharge status: Home / Self Care | Payer: Medicare Other | Source: Ambulatory Visit | Attending: Radiation Oncology | Admitting: Radiation Oncology

## 2016-12-11 DIAGNOSIS — C61 Malignant neoplasm of prostate: Secondary | ICD-10-CM | POA: Diagnosis not present

## 2016-12-11 DIAGNOSIS — Z510 Encounter for antineoplastic radiation therapy: Secondary | ICD-10-CM | POA: Diagnosis not present

## 2016-12-12 ENCOUNTER — Ambulatory Visit
Admission: RE | Admit: 2016-12-12 | Discharge: 2016-12-12 | Disposition: A | Discharge status: Home / Self Care | Payer: Medicare Other | Source: Ambulatory Visit | Attending: Radiation Oncology | Admitting: Radiation Oncology

## 2016-12-12 DIAGNOSIS — C61 Malignant neoplasm of prostate: Secondary | ICD-10-CM | POA: Diagnosis not present

## 2016-12-12 DIAGNOSIS — Z510 Encounter for antineoplastic radiation therapy: Secondary | ICD-10-CM | POA: Diagnosis not present

## 2016-12-13 ENCOUNTER — Ambulatory Visit
Admission: RE | Admit: 2016-12-13 | Discharge: 2016-12-13 | Disposition: A | Discharge status: Home / Self Care | Payer: Medicare Other | Source: Ambulatory Visit | Attending: Radiation Oncology | Admitting: Radiation Oncology

## 2016-12-13 DIAGNOSIS — G959 Disease of spinal cord, unspecified: Secondary | ICD-10-CM | POA: Diagnosis not present

## 2016-12-13 DIAGNOSIS — M25551 Pain in right hip: Secondary | ICD-10-CM | POA: Diagnosis not present

## 2016-12-13 DIAGNOSIS — Z510 Encounter for antineoplastic radiation therapy: Secondary | ICD-10-CM | POA: Diagnosis not present

## 2016-12-13 DIAGNOSIS — C61 Malignant neoplasm of prostate: Secondary | ICD-10-CM | POA: Diagnosis not present

## 2016-12-13 DIAGNOSIS — M5416 Radiculopathy, lumbar region: Secondary | ICD-10-CM | POA: Diagnosis not present

## 2016-12-13 DIAGNOSIS — M5412 Radiculopathy, cervical region: Secondary | ICD-10-CM | POA: Diagnosis not present

## 2016-12-14 ENCOUNTER — Other Ambulatory Visit (HOSPITAL_COMMUNITY): Payer: Self-pay | Admitting: Neurological Surgery

## 2016-12-14 ENCOUNTER — Ambulatory Visit
Admission: RE | Admit: 2016-12-14 | Discharge: 2016-12-14 | Disposition: A | Discharge status: Home / Self Care | Payer: Medicare Other | Source: Ambulatory Visit | Attending: Radiation Oncology | Admitting: Radiation Oncology

## 2016-12-14 ENCOUNTER — Other Ambulatory Visit: Payer: Self-pay | Admitting: Neurological Surgery

## 2016-12-14 ENCOUNTER — Encounter: Payer: Self-pay | Admitting: Radiation Oncology

## 2016-12-14 VITALS — BP 127/52 | HR 56 | Temp 97.9°F | Resp 20 | Wt 181.6 lb

## 2016-12-14 DIAGNOSIS — M5416 Radiculopathy, lumbar region: Secondary | ICD-10-CM

## 2016-12-14 DIAGNOSIS — M5412 Radiculopathy, cervical region: Secondary | ICD-10-CM

## 2016-12-14 DIAGNOSIS — Z510 Encounter for antineoplastic radiation therapy: Secondary | ICD-10-CM | POA: Diagnosis not present

## 2016-12-14 DIAGNOSIS — C61 Malignant neoplasm of prostate: Secondary | ICD-10-CM

## 2016-12-14 NOTE — Progress Notes (Addendum)
Weekly rad  tx prostate 35/40 completed,  No dysuria , no hematuria, has urgency but took 3x before any voiding, then had a good stream,  says only a couple teaspoons voided, nocturia x2, has a lot of false alarms and dribbles,  Bowels okay, appetite good, pain getting out of bed 10/10, from right pelvis and SI joint, took tramadol, Relafen,  Now a 3/10 pain scale, fatigued BP (!) 127/52 (BP Location: Left Arm, Patient Position: Sitting, Cuff Size: Normal)   Pulse (!) 56   Temp 97.9 F (36.6 C) (Oral)   Resp 20   Wt 181 lb 9.6 oz (82.4 kg)   BMI 28.44 kg/m   Wt Readings from Last 3 Encounters:  12/14/16 181 lb 9.6 oz (82.4 kg)  12/01/16 182 lb 6.4 oz (82.7 kg)  11/24/16 179 lb 12.8 oz (81.6 kg)   9:44 AM

## 2016-12-14 NOTE — Progress Notes (Signed)
  Radiation Oncology         [phone]   Name: Charles [name] MRN: [id number]   Date: 12/14/2016  DOB: [birth year]     Weekly Radiation Therapy Management    ICD-9-CM ICD-10-CM   1. Malignant neoplasm of prostate (HCC) 185 C61     Current Dose: 65 Gy  Planned Dose:  75 Gy  Narrative The patient presents for routine under treatment assessment.  Weekly rad txs prostate 35/40 completed. He denies dysuria, and hematuria. He reports urgency, but it takes three tries before voiding. He reports a good stream. He states nocturia x 2 with many false alarms, and dribble. He denies bowel issues. He notes a good appetite. He reports 10/10 pain to the right pelvis and SI joint upon getting out of bed this morning. He took Tramadol, Relafen and now reports 3/10 pain.The patient is fatigued.  Set-up films were reviewed. The chart was checked.  Physical Findings  weight is 181 lb 9.6 oz (82.4 kg). His oral temperature is 97.9 F (36.6 C). His blood pressure is 127/52 (abnormal) and his pulse is 56 (abnormal). His respiration is 20. . Weight essentially stable.  Alert and in no acute distress. No significant changes.  Impression The patient is tolerating radiation. The patient's pelvic pain could be caused by inflammation by radiation or lying down on the hard treatment table exacerbating degenerative changes in the spine.  Plan Continue treatment as planned. Advised patient to follow up with PCP if spine pain continues after completing treatment.       [name] [name], M.D.  This document serves as a record of services personally performed by [name], MD. It was created on his behalf by Bethann Humble, a trained medical scribe. The creation of this record is based on the scribe's personal observations and the provider's statements to them. This document has been checked and approved by the attending provider.

## 2016-12-15 ENCOUNTER — Ambulatory Visit
Admission: RE | Admit: 2016-12-15 | Discharge: 2016-12-15 | Disposition: A | Discharge status: Home / Self Care | Payer: Medicare Other | Source: Ambulatory Visit | Attending: Radiation Oncology | Admitting: Radiation Oncology

## 2016-12-15 DIAGNOSIS — Z510 Encounter for antineoplastic radiation therapy: Secondary | ICD-10-CM | POA: Diagnosis not present

## 2016-12-15 DIAGNOSIS — C61 Malignant neoplasm of prostate: Secondary | ICD-10-CM | POA: Diagnosis not present

## 2016-12-18 ENCOUNTER — Ambulatory Visit
Admission: RE | Admit: 2016-12-18 | Discharge: 2016-12-18 | Disposition: A | Discharge status: Home / Self Care | Payer: Medicare Other | Source: Ambulatory Visit | Attending: Radiation Oncology | Admitting: Radiation Oncology

## 2016-12-18 DIAGNOSIS — C61 Malignant neoplasm of prostate: Secondary | ICD-10-CM | POA: Diagnosis not present

## 2016-12-18 DIAGNOSIS — Z510 Encounter for antineoplastic radiation therapy: Secondary | ICD-10-CM | POA: Diagnosis not present

## 2016-12-19 ENCOUNTER — Ambulatory Visit
Admission: RE | Admit: 2016-12-19 | Discharge: 2016-12-19 | Disposition: A | Discharge status: Home / Self Care | Payer: Medicare Other | Source: Ambulatory Visit | Attending: Radiation Oncology | Admitting: Radiation Oncology

## 2016-12-19 DIAGNOSIS — D1801 Hemangioma of skin and subcutaneous tissue: Secondary | ICD-10-CM | POA: Diagnosis not present

## 2016-12-19 DIAGNOSIS — L821 Other seborrheic keratosis: Secondary | ICD-10-CM | POA: Diagnosis not present

## 2016-12-19 DIAGNOSIS — Z510 Encounter for antineoplastic radiation therapy: Secondary | ICD-10-CM | POA: Diagnosis not present

## 2016-12-19 DIAGNOSIS — Z85828 Personal history of other malignant neoplasm of skin: Secondary | ICD-10-CM | POA: Diagnosis not present

## 2016-12-19 DIAGNOSIS — C61 Malignant neoplasm of prostate: Secondary | ICD-10-CM | POA: Diagnosis not present

## 2016-12-19 DIAGNOSIS — L308 Other specified dermatitis: Secondary | ICD-10-CM | POA: Diagnosis not present

## 2016-12-19 DIAGNOSIS — D692 Other nonthrombocytopenic purpura: Secondary | ICD-10-CM | POA: Diagnosis not present

## 2016-12-20 ENCOUNTER — Ambulatory Visit: Payer: Medicare Other

## 2016-12-21 ENCOUNTER — Ambulatory Visit: Payer: Medicare Other

## 2016-12-22 DIAGNOSIS — G4733 Obstructive sleep apnea (adult) (pediatric): Secondary | ICD-10-CM | POA: Diagnosis not present

## 2016-12-22 DIAGNOSIS — R7309 Other abnormal glucose: Secondary | ICD-10-CM | POA: Diagnosis not present

## 2016-12-22 DIAGNOSIS — M5136 Other intervertebral disc degeneration, lumbar region: Secondary | ICD-10-CM | POA: Diagnosis not present

## 2016-12-22 DIAGNOSIS — I2510 Atherosclerotic heart disease of native coronary artery without angina pectoris: Secondary | ICD-10-CM | POA: Diagnosis not present

## 2016-12-22 DIAGNOSIS — Z1389 Encounter for screening for other disorder: Secondary | ICD-10-CM | POA: Diagnosis not present

## 2016-12-22 DIAGNOSIS — R2681 Unsteadiness on feet: Secondary | ICD-10-CM | POA: Diagnosis not present

## 2016-12-22 DIAGNOSIS — M545 Low back pain: Secondary | ICD-10-CM | POA: Diagnosis not present

## 2016-12-22 DIAGNOSIS — Z6828 Body mass index (BMI) 28.0-28.9, adult: Secondary | ICD-10-CM | POA: Diagnosis not present

## 2016-12-22 DIAGNOSIS — C61 Malignant neoplasm of prostate: Secondary | ICD-10-CM | POA: Diagnosis not present

## 2016-12-25 ENCOUNTER — Ambulatory Visit
Admission: RE | Admit: 2016-12-25 | Discharge: 2016-12-25 | Disposition: A | Discharge status: Home / Self Care | Payer: Medicare Other | Source: Ambulatory Visit | Attending: Radiation Oncology | Admitting: Radiation Oncology

## 2016-12-25 VITALS — BP 132/52 | HR 59 | Temp 98.0°F | Resp 18 | Wt 177.6 lb

## 2016-12-25 DIAGNOSIS — C61 Malignant neoplasm of prostate: Secondary | ICD-10-CM | POA: Diagnosis not present

## 2016-12-25 DIAGNOSIS — Z510 Encounter for antineoplastic radiation therapy: Secondary | ICD-10-CM | POA: Diagnosis not present

## 2016-12-25 NOTE — Progress Notes (Signed)
  Radiation Oncology         [phone]   Name: Charles [name] MRN: [id number]   Date: 12/25/2016  DOB: Nov 29, 1931     Weekly Radiation Therapy Management    ICD-9-CM ICD-10-CM   1. Malignant neoplasm of prostate (HCC) 185 C61     Current Dose: 73 Gy  Planned Dose:  75 Gy  Narrative The patient presents for routine under treatment assessment.  Weight and vitals stable. Patient reports worsening 5/10 back pain. He notes this pain worsens at night which requires him to use a walker to get from the bed to the restroom. He reports starting Prednisone 20 mg three days ago to help manage the pain. He reports urinary urgency, varried urine stream, and nocturia x 3. He denies dysuria, hematuria, or diarrhea. He reports fatigue.  Set-up films were reviewed. The chart was checked.  Physical Findings  weight is 177 lb 9.6 oz (80.6 kg). His oral temperature is 98 F (36.7 C). His blood pressure is 132/52 (abnormal) and his pulse is 59 (abnormal). His respiration is 18 and oxygen saturation is 100%. . Weight essentially stable.  Alert and in no acute distress. No significant changes.  Impression The patient is tolerating radiation.   Plan Continue treatment as planned. A one month follow up appointment card was given.       [name] [name], M.D.  This document serves as a record of services personally performed by [name], MD. It was created on his behalf by Bethann Humble, a trained medical scribe. The creation of this record is based on the scribe's personal observations and the provider's statements to them. This document has been checked and approved by the attending provider.

## 2016-12-25 NOTE — Progress Notes (Signed)
Weight and vitals stable. Reports low back pain worse on the right side 5 on a scale of 0-10. Reports this pain is much greater at night and requires him to use a walker to get from the bed to the restroom. Reports he was started on Prednisone 20 mg three days ago to help manage pain. Denies dysuria or hematuria. Reports urinary urgency continues. Reports his urine stream varies. Reports on occasion he has great urgency to void with little output then, others a strong steady stream. Reports nocturia x 3. Denies diarrhea. Reports fatigue. One month follow up appointment card given. Understands to contact this RN with future needs.   BP (!) 132/52 (BP Location: Left Arm, Patient Position: Sitting, Cuff Size: Normal)   Pulse (!) 59   Temp 98 F (36.7 C) (Oral)   Resp 18   Wt 177 lb 9.6 oz (80.6 kg)   SpO2 100%   BMI 27.82 kg/m  Wt Readings from Last 3 Encounters:  12/25/16 177 lb 9.6 oz (80.6 kg)  12/14/16 181 lb 9.6 oz (82.4 kg)  12/01/16 182 lb 6.4 oz (82.7 kg)

## 2016-12-26 ENCOUNTER — Ambulatory Visit
Admission: RE | Admit: 2016-12-26 | Discharge: 2016-12-26 | Disposition: A | Discharge status: Home / Self Care | Payer: Medicare Other | Source: Ambulatory Visit | Attending: Radiation Oncology | Admitting: Radiation Oncology

## 2016-12-26 DIAGNOSIS — C61 Malignant neoplasm of prostate: Secondary | ICD-10-CM | POA: Diagnosis not present

## 2016-12-26 DIAGNOSIS — Z510 Encounter for antineoplastic radiation therapy: Secondary | ICD-10-CM | POA: Diagnosis not present

## 2016-12-27 ENCOUNTER — Encounter: Payer: Self-pay | Admitting: Radiation Oncology

## 2016-12-27 NOTE — Progress Notes (Signed)
°  Radiation Oncology         [phone] ________________________________  Name: Charles [name] MRN: [id number]  Date: 12/27/2016  DOB: 12/04/1931  End of Treatment Note  Diagnosis:   Malignant neoplasm of prostate     Indication for treatment:  Curative, Definitive Radiotherapy       Radiation treatment dates:   10/23/16 - 12/26/16  Site/dose:  1. The prostate, seminal vesicles, and pelvic lymph nodes were initially treated to 45 Gy in 25 fractions of 1.8 Gy  2. The prostate only was boosted to 75 Gy with 15 additional fractions of 2.0 Gy   Beams/energy:  1. The prostate, seminal vesicles, and pelvic lymph nodes were initially treated using helical intensity modulated radiotherapy delivering 6 megavolt photons. Image guidance was performed with megavoltage CT studies prior to each fraction. He was immobilized with a body fix lower extremity mold.  2. the prostate only was boosted using helical intensity modulated radiotherapy delivering 6 megavolt photons. Image guidance was performed with megavoltage CT studies prior to each fraction. He was immobilized with a body fix lower extremity mold.  Narrative: The patient tolerated radiation treatment relatively well.   The patient experienced some minor urinary irritation and modest fatigue. Additionally, the patient experienced back pain which worsened somewhat towards the end of treatment. He noted that the pain worsened at night such that he required a walker to ambulate from bed to the restroom. To manage this pain he was started on Prednisone 20 mg for his back pain by another physician just before the conclusion of treatment.  Plan: The patient has completed radiation treatment. He was advised to follow up with his PCP should his back pain persist after completing treatment. He will return to radiation oncology clinic for routine followup in one month. I advised him to call or return sooner if he has any questions or concerns related to  his recovery or treatment. ________________________________  [name]. [name], M.D.  This document serves as a record of services personally performed by [name], MD. It was created on his behalf by Maryla Morrow, a trained medical scribe. The creation of this record is based on the scribe's personal observations and the provider's statements to them. This document has been checked and approved by the attending provider.

## 2017-01-01 ENCOUNTER — Other Ambulatory Visit: Payer: Self-pay | Admitting: Gastroenterology

## 2017-01-02 ENCOUNTER — Telehealth: Payer: Self-pay | Admitting: *Deleted

## 2017-01-02 NOTE — Telephone Encounter (Signed)
CALLED PATIENT TO ALTER FU APPT. ON 02-01-17, PATIENT AGREED TO COME IN @ 9:15 AM

## 2017-01-03 ENCOUNTER — Telehealth: Payer: Self-pay | Admitting: Gastroenterology

## 2017-01-03 MED ORDER — OMEPRAZOLE 40 MG PO CPDR: 40.0000 mg | DELAYED_RELEASE_CAPSULE | Freq: Two times a day (BID) | ORAL | 1 refills | Status: DC

## 2017-01-03 NOTE — Telephone Encounter (Signed)
Prescription sent to patient's pharmacy and patient informed to keep appt for any further refills. 

## 2017-01-04 ENCOUNTER — Telehealth: Payer: Self-pay | Admitting: Radiation Oncology

## 2017-01-04 ENCOUNTER — Ambulatory Visit
Admission: RE | Admit: 2017-01-04 | Discharge: 2017-01-04 | Disposition: A | Discharge status: Home / Self Care | Payer: Self-pay | Source: Ambulatory Visit | Attending: Radiation Oncology | Admitting: Radiation Oncology

## 2017-01-04 ENCOUNTER — Ambulatory Visit
Admission: RE | Admit: 2017-01-04 | Discharge: 2017-01-04 | Disposition: A | Discharge status: Home / Self Care | Payer: Medicare Other | Source: Ambulatory Visit | Attending: Radiation Oncology | Admitting: Radiation Oncology

## 2017-01-04 ENCOUNTER — Other Ambulatory Visit: Payer: Self-pay | Admitting: Radiation Oncology

## 2017-01-04 ENCOUNTER — Encounter: Payer: Self-pay | Admitting: Radiation Oncology

## 2017-01-04 VITALS — BP 128/45 | HR 60 | Temp 98.6°F | Resp 18 | Ht 67.0 in | Wt 180.0 lb

## 2017-01-04 DIAGNOSIS — Z9889 Other specified postprocedural states: Secondary | ICD-10-CM | POA: Diagnosis not present

## 2017-01-04 DIAGNOSIS — R339 Retention of urine, unspecified: Secondary | ICD-10-CM | POA: Diagnosis not present

## 2017-01-04 DIAGNOSIS — Z888 Allergy status to other drugs, medicaments and biological substances status: Secondary | ICD-10-CM | POA: Diagnosis not present

## 2017-01-04 DIAGNOSIS — C61 Malignant neoplasm of prostate: Secondary | ICD-10-CM | POA: Diagnosis not present

## 2017-01-04 DIAGNOSIS — Z7982 Long term (current) use of aspirin: Secondary | ICD-10-CM | POA: Insufficient documentation

## 2017-01-04 LAB — URINALYSIS, MICROSCOPIC - CHCC
BILIRUBIN (URINE): NEGATIVE
Glucose: NEGATIVE mg/dL
Ketones: NEGATIVE mg/dL
LEUKOCYTE ESTERASE: NEGATIVE
Nitrite: NEGATIVE
Protein: NEGATIVE mg/dL
SPECIFIC GRAVITY, URINE: 1.015 (ref 1.003–1.035)
Urobilinogen, UR: 0.2 mg/dL (ref 0.2–1)
pH: 6.5 (ref 4.6–8.0)

## 2017-01-04 MED ORDER — CIPROFLOXACIN HCL 500 MG PO TABS: 500.0000 mg | ORAL_TABLET | Freq: Two times a day (BID) | ORAL | 0 refills | Status: DC

## 2017-01-04 NOTE — Progress Notes (Signed)
LM for pt regarding UA results, and need to start antibiotics. Given his age, I called in Cipro 500 mg BID. He will see Dr. Lyndal Rainbow NP tomorrow as well.

## 2017-01-04 NOTE — Progress Notes (Signed)
Spoke with Danae Chen, RN for Dr. Junious Silk. Explained the patient is in the office presently and needs an appointment with Dr. Junious Silk to discuss post void residual. Explained PA, Dara Lords pulled 600 cc of urine post void from the patient today while in the clinic. Danae Chen reports Dr. Junious Silk has no availability until 01/17/17. We agreed this is too long for the patient to weight thus, Danae Chen provided an appointment for tomorrow at 1245 with Charles [name]. Provided patient with appointment with Charles [name] tomorrow at 1245 at [facility]. Patient confirms he will be present.

## 2017-01-04 NOTE — Telephone Encounter (Signed)
I spoke with the patient and he's been having trouble emptying his bladder and feels that the amount of urine he should be making is not truly representative of his actual output. I encouraged him to come today for an evaluation, we will do a UA, and postvoid residual. If he has retention, I will contact Dr. Junious Silk and consider flomax, taking into consideration his opthalmic hx and age.

## 2017-01-04 NOTE — Progress Notes (Addendum)
[name]. [place] 83 y.o man withMalignant neoplasm of prostate  FU.   Pain: He is currently denies pain.    URINARY: He  reports urinary retention for the past three weeks extreme past three days.  He reports a strong urge to void unable to void very much.  Reports having incomplete emptying of his bladder. Pt states he gets up to urinate 5  times per night.  Has voided 200 cc of urine since 0800 this morning plus 30cc clear yellow urine here at 1530. BOWEL: He reports a  bowel movement everyday normal movements. Fatigue:Fatigue since getting a sinus infection. Appetite:Good Weight: Wt Readings from Last 3 Encounters:  01/04/17 180 lb (81.6 kg)  12/25/16 177 lb 9.6 oz (80.6 kg)  12/14/16 181 lb 9.6 oz (82.4 kg)   Urologist:[place] to see  February 12, 2017 Lupron injection: Last  dose  Sept. 2017                                                           Lupron injection every 6 months last dose PSA: BP (!) 128/45   Pulse 60   Temp 98.6 F (37 C) (Oral)   Resp 18   Ht 5\' 7"  (1.702 m)   Wt 180 lb (81.6 kg)   SpO2 99%   BMI 28.19 kg/m

## 2017-01-05 DIAGNOSIS — R3911 Hesitancy of micturition: Secondary | ICD-10-CM | POA: Diagnosis not present

## 2017-01-05 DIAGNOSIS — R3912 Poor urinary stream: Secondary | ICD-10-CM | POA: Diagnosis not present

## 2017-01-05 LAB — URINE CULTURE

## 2017-01-05 NOTE — Progress Notes (Signed)
Radiation Oncology         [phone] ________________________________  Name: Charles [name] MRN: [id number]  Date: 01/04/2017  DOB: 1931-06-01  Post Treatment Note  CC: Charles Lopes, MD  Charles Aloe, MD  Diagnosis:   83 y.o. gentleman with stage T1c adenocarcinoma of the prostate with a Gleason's score of 4+4 and a PSA of 4.82  Interval Since Last Radiation:  1 weeks   10/23/16 - 12/26/16: 1. The prostate, seminal vesicles, and pelvic lymph nodes were initially treated to 45 Gy in 25 fractions of 1.8 Gy  2. The prostate only was boosted to 75 Gy with 15 additional fractions of 2.0 Gy   Narrative:  The patient returns today for a work in due to urinary retention. The patient reports that in the last 24 hours, he has been unable to completely void and has only urinated about 200 cc. He denies any abdominal or pelvic pain. He denies any fevers, or flank pain. He has noticed more dysuria in the last few weeks. He denies any hematuria or pyuria. No other complaints are verbalized, and in reviewing his medication history, he has not changed how he takes Flomax daily.   ALLERGIES:  is allergic to celebrex [celecoxib] and cyclobenzaprine.  Meds: Current Outpatient Prescriptions  Medication Sig Dispense Refill  . Acetaminophen 500 MG coapsule Take 2 capsules by mouth daily as needed for pain.     Marland Kitchen aspirin EC 81 MG tablet Take 81 mg by mouth daily.    Marland Kitchen buPROPion (WELLBUTRIN SR) 150 MG 12 hr tablet Take 150 mg by mouth 2 (two) times daily.     . cholecalciferol (VITAMIN D) 1000 UNITS tablet Take 1,000 Units by mouth daily.    Marland Kitchen gabapentin (NEURONTIN) 300 MG capsule Take 1 tablet PO at 4 PM  and 1 tablet at 10 PM 180 capsule 3  . Leuprolide Acetate, 6 Month, (LUPRON DEPOT, 30-MONTH, IM) Inject into the muscle.    . lovastatin (MEVACOR) 20 MG tablet Take 1 tablet by mouth at bedtime.    . Magnesium 250 MG TABS Take 250 mg by mouth daily.     . methocarbamol (ROBAXIN) 500 MG  tablet Take 500 mg by mouth every 8 (eight) hours as needed for muscle spasms.    . Multiple Minerals-Vitamins (CITRACAL PLUS BONE DENSITY) TABS Take 1 tablet by mouth 2 (two) times daily.     . Multiple Vitamins-Minerals (CENTRUM SILVER ADULT 50+) TABS Take 1 tablet by mouth daily with breakfast.    . mupirocin ointment (BACTROBAN) 2 %     . nabumetone (RELAFEN) 500 MG tablet Take 500 mg by mouth 2 (two) times daily as needed for mild pain.     . Omega-3 Fatty Acids (FISH OIL) 1200 MG CAPS Take 1 capsule by mouth daily.    Marland Kitchen omeprazole (PRILOSEC) 40 MG capsule Take 1 capsule (40 mg total) by mouth 2 (two) times daily. 60 capsule 1  . propranolol (INDERAL) 10 MG tablet Take 10 mg by mouth daily.     . ranitidine (ZANTAC) 300 MG tablet Take 1 tablet (300 mg total) by mouth at bedtime. 90 tablet 3  . rOPINIRole (REQUIP) 1 MG tablet Take 2 mg by mouth at bedtime.    . Tamsulosin HCl (FLOMAX) 0.4 MG CAPS Take 0.4 mg by mouth daily.     . traMADol (ULTRAM) 50 MG tablet Take 2 tablets (100 mg total) by mouth 2 (two) times daily as needed for moderate pain. [place]  tablet 3  . zoledronic acid (RECLAST) 5 MG/100ML SOLN injection Inject into the vein.    . ciprofloxacin (CIPRO) 500 MG tablet Take 1 tablet (500 mg total) by mouth 2 (two) times daily. 10 tablet 0  . senna (SENOKOT) 8.6 MG TABS tablet Take 1 tablet by mouth daily as needed for mild constipation.     No current facility-administered medications for this encounter.     Physical Findings:  height is 5\' 7"  (1.702 m) and weight is 180 lb (81.6 kg). His oral temperature is 98.6 F (37 C). His blood pressure is 128/45 (abnormal) and his pulse is 60. His respiration is 18 and oxygen saturation is 99%.  In general this is a well appearing caucasian male in no acute distress. He's alert and oriented x4 and appropriate throughout the examination. Cardiopulmonary assessment is negative for acute distress and he exhibits normal effort. The patient was able  to urinate 20 cc in the restroom prior to further examination. GU exam reveals normal appearing external male genitalia without evidence of lesions, or discharge from the urethra. Betadine was used to sterilize the glans. Without difficulty a 16 french foley catheter was advanced. Slow urinary return was noted totaling 600 cc. The patient wished to have this removed after the flow of urine ceased. This was removed without difficulty.  Lab Findings: Lab Results  Component Value Date   WBC 5.8 05/08/2016   HGB 14.2 05/08/2016   HCT 42.9 05/08/2016   MCV 91.1 05/08/2016   PLT 143 (L) 05/08/2016     Radiographic Findings: No results found.  Impression/Plan: 1. 83 y.o. gentleman with high risk Stage T1c adenocarcinoma of the prostate with a Gleason Score of 4+4 and a PSA of 4.82. The patient has done well since radiotherapy. He will continue to follow up with urology in 2-3 months for his first PSA check. We would be happy to see him as needed moving forward. 2. Urinary retention. The patient feels better at this time. We discussed the options to follow up with urology tomorrow, and he will increase his flomax to BID. We discussed that this was an off label use of the medication, and reviewed the risk/benefits and side effect profile. He states agreement and understanding. 3. Dysuria. The patient's urine was sent for UA and culture. We will follow up with the results of this when available.    Carola Rhine, PAC

## 2017-01-10 ENCOUNTER — Ambulatory Visit (HOSPITAL_COMMUNITY)
Admission: RE | Admit: 2017-01-10 | Discharge: 2017-01-10 | Disposition: A | Discharge status: Home / Self Care | Payer: Medicare Other | Source: Ambulatory Visit | Attending: Neurological Surgery | Admitting: Neurological Surgery

## 2017-01-10 ENCOUNTER — Other Ambulatory Visit (HOSPITAL_COMMUNITY): Payer: Self-pay | Admitting: Neurological Surgery

## 2017-01-10 DIAGNOSIS — M48061 Spinal stenosis, lumbar region without neurogenic claudication: Secondary | ICD-10-CM | POA: Insufficient documentation

## 2017-01-10 DIAGNOSIS — M5116 Intervertebral disc disorders with radiculopathy, lumbar region: Secondary | ICD-10-CM | POA: Insufficient documentation

## 2017-01-10 DIAGNOSIS — M5416 Radiculopathy, lumbar region: Secondary | ICD-10-CM

## 2017-01-10 DIAGNOSIS — Z981 Arthrodesis status: Secondary | ICD-10-CM | POA: Insufficient documentation

## 2017-01-10 DIAGNOSIS — M4726 Other spondylosis with radiculopathy, lumbar region: Secondary | ICD-10-CM | POA: Diagnosis not present

## 2017-01-10 DIAGNOSIS — R918 Other nonspecific abnormal finding of lung field: Secondary | ICD-10-CM | POA: Insufficient documentation

## 2017-01-10 DIAGNOSIS — M5412 Radiculopathy, cervical region: Secondary | ICD-10-CM

## 2017-01-10 DIAGNOSIS — M50322 Other cervical disc degeneration at C5-C6 level: Secondary | ICD-10-CM | POA: Insufficient documentation

## 2017-01-10 DIAGNOSIS — M50221 Other cervical disc displacement at C4-C5 level: Secondary | ICD-10-CM | POA: Diagnosis not present

## 2017-01-10 DIAGNOSIS — M5146 Schmorl's nodes, lumbar region: Secondary | ICD-10-CM | POA: Diagnosis not present

## 2017-01-10 DIAGNOSIS — M5031 Other cervical disc degeneration,  high cervical region: Secondary | ICD-10-CM | POA: Diagnosis not present

## 2017-01-10 DIAGNOSIS — M50323 Other cervical disc degeneration at C6-C7 level: Secondary | ICD-10-CM | POA: Insufficient documentation

## 2017-01-10 DIAGNOSIS — M4802 Spinal stenosis, cervical region: Secondary | ICD-10-CM | POA: Insufficient documentation

## 2017-01-10 MED ORDER — DIAZEPAM 5 MG PO TABS
ORAL_TABLET | ORAL | Status: AC
  Administered 2017-01-10: 10 mg via ORAL
  Filled 2017-01-10: qty 2

## 2017-01-10 MED ORDER — ONDANSETRON HCL 4 MG/2ML IJ SOLN: 4.0000 mg | Freq: Four times a day (QID) | INTRAMUSCULAR | Status: DC | PRN

## 2017-01-10 MED ORDER — DIAZEPAM 5 MG PO TABS
10.0000 mg | ORAL_TABLET | Freq: Once | ORAL | Status: AC
  Administered 2017-01-10: 10 mg via ORAL

## 2017-01-10 MED ORDER — LIDOCAINE HCL 1 % IJ SOLN
INTRAMUSCULAR | Status: AC
  Filled 2017-01-10: qty 10

## 2017-01-10 MED ORDER — LIDOCAINE HCL (PF) 1 % IJ SOLN
5.0000 mL | Freq: Once | INTRAMUSCULAR | Status: AC
  Administered 2017-01-10: 5 mL via INTRADERMAL

## 2017-01-10 MED ORDER — HYDROCODONE-ACETAMINOPHEN 5-325 MG PO TABS: 1.0000 | ORAL_TABLET | ORAL | Status: DC | PRN

## 2017-01-10 MED ORDER — IOPAMIDOL (ISOVUE-M 300) INJECTION 61%
15.0000 mL | Freq: Once | INTRAMUSCULAR | Status: AC | PRN
  Administered 2017-01-10: 11 mL via INTRATHECAL

## 2017-01-10 MED ORDER — IOPAMIDOL (ISOVUE-M 300) INJECTION 61%
INTRAMUSCULAR | Status: AC
  Administered 2017-01-10: 11 mL via INTRATHECAL
  Filled 2017-01-10: qty 15

## 2017-01-10 NOTE — Discharge Instructions (Signed)
Myelography, Care After °These instructions give you information on caring for yourself after your procedure. Your doctor may also give you more specific instructions. Call your doctor if you have any problems or questions after your procedure. °HOME CARE °· Rest the first day. °· When you rest, lie flat, with your head slightly raised (elevated). °· Avoid heavy lifting and activity for 48 hours, or as told by your doctor. °· You may take the bandage (dressing) off one day after the test, or as told by your doctor. °· Take all medicines only as told by your doctor. °· Ask your doctor when it is okay to take a shower or bath. °· Ask your doctor when your test results will be ready and how you can get them. Make sure you follow up and get your results. °· Do not drink alcohol for 24 hours, or as told by your doctor. °· Drink enough fluid to keep your pee (urine) clear or pale yellow. °GET HELP IF:  °· You have a fever. °· You have a headache. °· You feel sick to your stomach (nauseous) or throw up (vomit). °· You have pain or cramping in your belly (abdomen). °GET HELP RIGHT AWAY IF:  °· You have a headache with a stiff neck or fever. °· You have trouble breathing. °· Any of the places where the needles were put in are: °¨ Puffy (swollen) or red. °¨ Sore or hot to the touch. °¨ Draining yellowish-white fluid (pus). °¨ Bleeding. °MAKE SURE YOU: °· Understand these instructions. °· Will watch your condition. °· Will get help right away if you are not doing well or get worse. °This information is not intended to replace advice given to you by your health care provider. Make sure you discuss any questions you have with your health care provider. °Document Released: 08/29/2008 Document Revised: 12/11/2014 Document Reviewed: 09/02/2015 °Elsevier Interactive Patient Education © 2017 [employer] ° °

## 2017-01-10 NOTE — Procedures (Signed)
Charles [name] is an 83 year old individual who's had a previous decompression and fusion for severe spondylitic stenosis degenerative spondylolisthesis at L4-5 with adult degenerative scoliosis. Surgery was done several years ago by Dr. Clair Gulling that. Patient had persistent right lumbar radiculopathy. He had what appeared to be a pseudoarthrosis occurring in the lower lumbar spine is right-sided L5 pedicle screw seemed to be close to the region of the foramen. Rate started hardware was removed. This affected his pain a little bit but he still had substantial issues with significant back pain and bilateral leg pain with weakness is a great deal of difficulty standing straight and erect. I've been following him for last 2 years time and after some discussion with him in the office I suggested that a myelogram and post myelogram CAT scan should be repeated to see if there is any other new areas of pathology that may be aggravating his symptoms causing him further deterioration. This is now being performed.    Pre op Dx: Lumbar scoliosis, lumbar radiculopathy, history of fusion L2 to the sacrum. Post op Dx: Same Procedure: Total myelogram Surgeon: Siren Porrata Puncture level: L3 Fluid color: Clear colorless Injection: Isovue-300. 11 Ml severe spondylitic changes with fusion L2 to sacrum, adjacent level disease at L1-L2. Findings: Severe spondylitic stenosis at L1-L2 above previous fusion L2 to sacrum. Further evaluation of cervical spine and lumbar spine with CT scanning post myelogram.

## 2017-01-11 DIAGNOSIS — M5416 Radiculopathy, lumbar region: Secondary | ICD-10-CM | POA: Diagnosis not present

## 2017-01-17 ENCOUNTER — Ambulatory Visit (INDEPENDENT_AMBULATORY_CARE_PROVIDER_SITE_OTHER): Payer: Medicare Other | Admitting: Physical Medicine and Rehabilitation

## 2017-01-17 ENCOUNTER — Encounter (INDEPENDENT_AMBULATORY_CARE_PROVIDER_SITE_OTHER): Payer: Self-pay | Admitting: Physical Medicine and Rehabilitation

## 2017-01-17 VITALS — BP 140/64 | HR 55

## 2017-01-17 DIAGNOSIS — M461 Sacroiliitis, not elsewhere classified: Secondary | ICD-10-CM

## 2017-01-17 DIAGNOSIS — M5442 Lumbago with sciatica, left side: Secondary | ICD-10-CM | POA: Diagnosis not present

## 2017-01-17 DIAGNOSIS — M961 Postlaminectomy syndrome, not elsewhere classified: Secondary | ICD-10-CM | POA: Diagnosis not present

## 2017-01-17 DIAGNOSIS — M5441 Lumbago with sciatica, right side: Secondary | ICD-10-CM

## 2017-01-17 DIAGNOSIS — G5701 Lesion of sciatic nerve, right lower limb: Secondary | ICD-10-CM | POA: Diagnosis not present

## 2017-01-17 DIAGNOSIS — G8929 Other chronic pain: Secondary | ICD-10-CM | POA: Diagnosis not present

## 2017-01-17 DIAGNOSIS — G894 Chronic pain syndrome: Secondary | ICD-10-CM | POA: Diagnosis not present

## 2017-01-17 NOTE — Progress Notes (Signed)
Charles [name] - 83 y.o. male MRN [id number]  Date of birth: [birth year]  Office Visit Note: Visit Date: 01/17/2017 PCP: Donnajean Lopes, MD Referred by: Leanna Battles, MD  Subjective: Chief Complaint  Patient presents with  . Lower Back - Pain   HPI: Charles [name] is an 83 year old gentleman who comes in today at the request of his primary care physician Dr. Sharlett Iles. Dr. Sharlett Iles felt like maybe be a candidate for radiofrequency ablation of the lumbar spine. I know Charles [name] fairly well. We use to see him through Dr. Louanne Skye. Dr. Louanne Skye ultimately referred him to Dr. Ellene Route who has been treating him since that point. The patient has had a fusion performed of the lumbar spine from L2 all the way to the sacrum. He had his hardware removed by Dr. Ellene Route in June 2017. He continues to have low back pain radiating down both legs right worse than left. He has some pain in the posterior lateral right hip and some pain in the left groin at times. Dr. Ellene Route evaluated his hips and felt that there was no osteoarthritis of the hips. He also had a lumbar myelogram that actually looked at the cervical and lumbar region and this is reviewed below. Basically showing solid L2-S1 fusion without any stenosis. There was some mild lateral recess narrowing at L2-3 an L4-5. The patient has not responded to any medication management and did not do well with prior injections. The myelogram has not shown any reason to have radicular pain down the legs from a standpoint of nerve compression. Dr. Ellene Route is also started him working with a trainer for exercise and strengthening his legs. He continues to take gabapentin 300 mg 3 times a day as well as tramadol and Relafen. He has not noted any distal foot weakness or foot drop. He says his legs are weak in general. I did difficult to walk at times.   Review of Systems  Constitutional: Negative for chills, fever, malaise/fatigue and weight loss.  HENT: Negative  for hearing loss and sinus pain.   Eyes: Negative for blurred vision, double vision and photophobia.  Respiratory: Negative for cough and shortness of breath.   Cardiovascular: Negative for chest pain, palpitations and leg swelling.  Gastrointestinal: Negative for abdominal pain, nausea and vomiting.  Genitourinary: Negative for flank pain.  Musculoskeletal: Positive for back pain and joint pain. Negative for myalgias.  Skin: Negative for itching and rash.  Neurological: Positive for tingling. Negative for tremors, focal weakness and weakness.  Endo/Heme/Allergies: Negative.   Psychiatric/Behavioral: Negative for depression.  All other systems reviewed and are negative.  Otherwise per HPI.  Assessment & Plan: Visit Diagnoses:  1. Chronic bilateral low back pain with bilateral sciatica   2. Post laminectomy syndrome   3. Chronic pain syndrome   4. Sacroiliitis (Charles [name])   5. Piriformis syndrome of right side     Plan: Findings:  Chronic pain history and postlaminectomy syndrome in a patient with L2-S1 fusion with recent CT myelogram showing no areas of central canal stenosis or foraminal stenosis with some lateral recess narrowing at L2-S3 and L4-5. The patient is having low back pain and almost radicular type pain particularly on the right. He is fused to the sacrum and may be getting some pain from the sacroiliac joint on that side. With these fusions there has to be some movement of the spine in general and this is usually above the fusion and below the fusion. He does not have adjacent level disease  above the fusion at least to be degree. He does have problems again referred to the hip and groin which could be sacroiliac joint mediated type pain. He is not a candidate for radiofrequency ablation is basically all of his levels are fused in the facet joints don't move at this point. It is somewhat controversial but in general no one does ablation over the joints that have been fused. He would be  a candidate for sacroiliac joint or piriformis injection under fluoroscopic guidance. This would be diagnostic and somewhat therapeutic if it helped. He is a candidate for possible TENS unit. I think the best approach right now is the physical therapy and training that he's undergoing. He does ambulate with a significant forward flexed spine and hunched over hip flexion on the right. He is on a decent medication regimen. I'm not sure I have much to offer at this point other than diagnostic injection in regrouping with physical therapy from a different approach.    Meds & Orders: No orders of the defined types were placed in this encounter.  No orders of the defined types were placed in this encounter.   Follow-up: Return if symptoms worsen or fail to improve, for Consider right sacroiliac joint versus piriformis injection with fluoroscopic guidance.   Procedures: No procedures performed  No notes on file   Clinical History: CT myelogram of the cervical and lumbar spine dated 01/10/2017  IMPRESSION: 1. Advanced cervical disc degeneration at C5-6 and C6-7 with moderate bilateral foraminal stenosis, unchanged. No spinal stenosis. 2. Advanced mid upper cervical facet arthrosis with severe right foraminal stenosis at C3-4 and moderate left foraminal stenosis at C4-5, unchanged. 3. Changes in the lung apices which may reflect mild edema. 4. Progressive, advanced disc degeneration at L1-2 with mild bilateral lateral recess stenosis, mild spinal stenosis, and mild left neural foraminal stenosis. 5. Solid L2-S1 fusion without evidence of significant residual stenosis. Unchanged punctate calcifications or tiny osseous fragments in the right lateral recess at L2-3 and L4-5.  He reports that he has never smoked. He has never used smokeless tobacco. No results for input(s): HGBA1C, LABURIC in the last 8760 hours.  Objective:  VS:  HT:    WT:   BMI:     BP:140/64  HR:(!) 55bpm  TEMP: ( )   RESP:  Physical Exam  Constitutional: He is oriented to person, place, and time. He appears well-developed and well-nourished. No distress.  HENT:  Head: Normocephalic and atraumatic.  Eyes: Conjunctivae are normal. Pupils are equal, round, and reactive to light.  Neck: Normal range of motion. Neck supple.  Cardiovascular: Regular rhythm and intact distal pulses.   Pulmonary/Chest: Effort normal. No respiratory distress.  Musculoskeletal:  Patient is very slow to rise from a seated position. He stands with a forward flexed spine particularly with right shift. He is very stiff to the lumbar spine with extension. He has no pain with hip rotations. He has no pain over the greater trochanter. He has good distal strength without clonus.  Neurological: He is alert and oriented to person, place, and time. He exhibits normal muscle tone. Coordination normal.  Skin: Skin is warm and dry. No rash noted. No erythema.  Psychiatric: He has a normal mood and affect.  Nursing note and vitals reviewed.   Ortho Exam Imaging: No results found.  Past Medical/Family/Surgical/Social History: Medications & Allergies reviewed per EMR Patient Active Problem List   Diagnosis Date Noted  . Chronic bilateral low back pain with bilateral sciatica  01/17/2017  . Post laminectomy syndrome 01/17/2017  . Chronic pain syndrome 01/17/2017  . Sacroiliitis ([place]) 01/17/2017  . Piriformis syndrome of right side 01/17/2017  . Malignant neoplasm of prostate ([place]) 07/19/2016  . Fixation hardware in spine 05/15/2016  . Hypoglycemic reaction 02/02/2015  . Tremor, essential 12/11/2014  . OSA on CPAP 04/24/2014  . 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 Hypersomnia with sleep apnea, unspecified 04/24/2014  . Depression 04/18/2013  . BPH (benign prostatic hyperplasia) 04/18/2013  . Impingement syndrome of right shoulder 04/14/2013    Class: Acute  . Scoliosis or kyphoscoliosis 04/08/2013    Class: Chronic  .  Lumbosacral spondylosis without myelopathy 04/08/2013    Class: Chronic  . Acute respiratory failure ([place]) 04/08/2013  . HTN (hypertension) 04/08/2013  . Lymphocytosis   . HIATAL HERNIA 11/21/2010  . DIVERTICULAR DISEASE 11/21/2010  . BARRETT'S ESOPHAGUS 02/22/2010  . GERD 07/20/2008  . INGUINAL HERNIA, LEFT, SMALL 07/20/2008  . OTH VENTRAL HERN W/O MENTION OBSTRUCTION/GANGREN 07/20/2008  . CONSTIPATION 07/20/2008  . DYSPHAGIA 07/20/2008   Past Medical History:  Diagnosis Date  . Arthritis   . AVM (arteriovenous malformation)   . Barrett's esophagus   . BPH (benign prostatic hyperplasia)   . CAD (coronary artery disease)    only on stress test; never had MI, PCI or CABG.  In 2008, LVEF was reportedly normal.   . Cataract   . Constipation   . CPAP (continuous positive airway pressure) dependence   . Depression   . Diverticulosis   . Diverticulosis   . GERD (gastroesophageal reflux disease)   . Hyperlipidemia   . Hypoglycemia   . Lymphocytosis 06/2012  . Mastodynia   . Murmur   . Osteoporosis   . Osteoporosis   . Prostate cancer ([place])   . Sleep apnea    wears CPAP  . Urinary retention with incomplete bladder emptying    pt uses bathroom and within 30 mins needs to go again  . Vitamin D deficiency    Family History  Problem Relation Age of Onset  . Prostate cancer Father   . CAD Mother   . Arthritis-Osteo Brother    Past Surgical History:  Procedure Laterality Date  . BACK SURGERY  04/2013  . back surgury     f  . BLEPHAROPLASTY Bilateral 02/23/15  . CATARACT EXTRACTION    . COLONOSCOPY    . EYE SURGERY Bilateral   . FINGER SURGERY Right    right thumb  . HARDWARE REMOVAL Right 05/15/2016   Procedure: Right Lumbar Two-Lumbar Five Removal of Hardware;  Surgeon: Kristeen Miss, MD;  Location: [facility];  Service: Neurosurgery;  Laterality: Right;  Right L2-5 removal of hardware  . INGUINAL HERNIA REPAIR     x4  . REPAIR SPIGELIAN HERNIA  2011  . SPINAL FUSION   1996  . UPPER GASTROINTESTINAL ENDOSCOPY    . VASECTOMY     Social History   Occupational History  . Retired Retired    retired Arboriculturist   Social History Main Topics  . Smoking status: Never Smoker  . Smokeless tobacco: Never Used  . Alcohol use No  . Drug use: No  . Sexual activity: No

## 2017-01-22 DIAGNOSIS — M180 Bilateral primary osteoarthritis of first carpometacarpal joints: Secondary | ICD-10-CM | POA: Diagnosis not present

## 2017-02-01 ENCOUNTER — Ambulatory Visit
Admission: RE | Admit: 2017-02-01 | Discharge: 2017-02-01 | Disposition: A | Discharge status: Home / Self Care | Payer: Medicare Other | Source: Ambulatory Visit | Attending: Radiation Oncology | Admitting: Radiation Oncology

## 2017-02-01 DIAGNOSIS — Z888 Allergy status to other drugs, medicaments and biological substances status: Secondary | ICD-10-CM | POA: Diagnosis not present

## 2017-02-01 DIAGNOSIS — C61 Malignant neoplasm of prostate: Secondary | ICD-10-CM

## 2017-02-01 DIAGNOSIS — Z7982 Long term (current) use of aspirin: Secondary | ICD-10-CM | POA: Diagnosis not present

## 2017-02-01 DIAGNOSIS — Z9889 Other specified postprocedural states: Secondary | ICD-10-CM | POA: Diagnosis not present

## 2017-02-01 NOTE — Progress Notes (Signed)
[name]. [place] 83 y.o  man with Malignant neoplasm of prostate radiation completed 12-26-16 one month FU.  Pain: He is currently having back pain 2/10 taking ultram.    URINARY: He denies reports urinary frequency,urinary urgency,hematuria.  Reports he is  emptying his bladder. Pt states he gets up to urinate 1-2 times per night. BOWEL: He reports a  bowel movement everyday/everyother day, normal bowel movements; denies having diarrhea. Fatigue:Denies having fatigue able to do his normal daily routine. Appetite:Good Weight: Wt Readings from Last 3 Encounters:  01/10/17 175 lb (79.4 kg)  01/04/17 180 lb (81.6 kg)  12/25/16 177 lb 9.6 oz (80.6 kg)   Urologist: Dr. Junious Silk Lupron injection every 6 months: Last  dose 08-2016   Due 02-12-17

## 2017-02-01 NOTE — Addendum Note (Signed)
Encounter addended by: Malena Edman, RN on: 02/01/2017  9:39 AM<BR>    Actions taken: Charge Capture section accepted

## 2017-02-01 NOTE — Progress Notes (Signed)
Radiation Oncology         [phone] ________________________________  Name: Charles [name] MRN: [id number]  Date: 02/01/2017  DOB: 1930/12/24  Post Treatment Note  CC: Donnajean Lopes, MD  Festus Aloe, MD  Diagnosis:  83 y.o.gentleman with stage T1cadenocarcinoma of the prostate with a Gleason's score of 4+4and a PSA of 4.82  Interval Since Last Radiation: 5 weeks  10/23/16 - 12/26/16: 1. The prostate, seminal vesicles, and pelvic lymph nodes were initially treated to 45 Gy in 25 fractions of 1.8 Gy  2. The prostate only was boosted to 75 Gy with 15 additional fractions of 2.0 Gy    Narrative:  The patient returns today for routine follow-up.  He has recovered well from the effects of radiation. He is currently without complaints. He did have difficulty with urinary retention approximately 1 week following completion of radiotherapy. He was evaluated in our office and PVR at that time revealed 600 mL post voiding. He was advised to increase his daily Flomax to twice a day dosing. We were able to arrange a working visit with the nurse practitioner at [facility] the following day and patient reports that he had significantly improved bladder emptying and a negative UA and culture.                On review of systems, the patient states that he has continued using the Flomax twice a day. Currently, he reports a strong urine stream with decreased urgency and frequency. He denies hematuria, dysuria, fever or chills. No other complaints are verbalized.                 ALLERGIES:  is allergic to celebrex [celecoxib] and cyclobenzaprine.  Meds: Current Outpatient Prescriptions  Medication Sig Dispense Refill  . acetaminophen (TYLENOL) 500 MG tablet Take 1,000 mg by mouth every 6 (six) hours as needed for mild pain. TAKES WITH TRAMADOL    . aspirin EC 81 MG tablet Take 81 mg by mouth at bedtime.     Marland Kitchen buPROPion (WELLBUTRIN SR) 150 MG 12 hr tablet Take 150 mg by mouth 2  (two) times daily.     . cholecalciferol (VITAMIN D) 1000 UNITS tablet Take 1,000 Units by mouth daily.    . ferrous sulfate 325 (65 FE) MG EC tablet Take 325 mg by mouth daily with breakfast.    . gabapentin (NEURONTIN) 300 MG capsule Take 1 tablet PO at 4 PM  and 1 tablet at 10 PM (Patient taking differently: Take 300 mg by mouth 3 (three) times daily. ) 180 capsule 3  . Leuprolide Acetate, 6 Month, (LUPRON DEPOT, 64-MONTH, IM) Inject into the muscle. NEXT INJECTION IS DUE 02-2017.  GETS AT [facility]    . lovastatin (MEVACOR) 20 MG tablet Take 20 mg by mouth at bedtime.     . Magnesium 250 MG TABS Take 250 mg by mouth every evening.     . methocarbamol (ROBAXIN) 500 MG tablet Take 500 mg by mouth every 8 (eight) hours as needed for muscle spasms.    . Multiple Minerals-Vitamins (CITRACAL PLUS BONE DENSITY) TABS Take 1 tablet by mouth 2 (two) times daily.     . Multiple Vitamins-Minerals (CENTRUM SILVER ADULT 50+) TABS Take 1 tablet by mouth daily with breakfast.    . mupirocin ointment (BACTROBAN) 2 % Apply 1 application topically daily as needed (WOUND CARE).     . nabumetone (RELAFEN) 500 MG tablet Take 500 mg by mouth 2 (two) times daily  as needed for mild pain.     . Omega-3 Fatty Acids (FISH OIL ULTRA) 1400 MG CAPS Take 1,400 mg by mouth daily.    Marland Kitchen omeprazole (PRILOSEC) 40 MG capsule Take 1 capsule (40 mg total) by mouth 2 (two) times daily. 60 capsule 1  . oxymetazoline (AFRIN) 0.05 % nasal spray Place 1 spray into both nostrils 2 (two) times daily as needed for congestion.    . propranolol (INDERAL) 10 MG tablet Take 10 mg by mouth daily.     . ranitidine (ZANTAC) 300 MG tablet Take 1 tablet (300 mg total) by mouth at bedtime. 90 tablet 3  . rOPINIRole (REQUIP) 1 MG tablet Take 2 mg by mouth at bedtime.    . senna (SENOKOT) 8.6 MG TABS tablet Take 1 tablet by mouth daily as needed for mild constipation.    . Tamsulosin HCl (FLOMAX) 0.4 MG CAPS Take 0.4 mg by mouth 2 (two) times  daily.     . traMADol (ULTRAM) 50 MG tablet Take 2 tablets (100 mg total) by mouth 2 (two) times daily as needed for moderate pain. 60 tablet 3  . triamcinolone cream (KENALOG) 0.1 % Apply 1 application topically daily as needed (RASH).    Marland Kitchen zoledronic acid (RECLAST) 5 MG/100ML SOLN injection Inject into the vein. NEXT IS DUE 03-2017     No current facility-administered medications for this encounter.     Physical Findings:  vitals were not taken for this visit.  /10 In general this is a well appearing Caucasian male in no acute distress. He's alert and oriented x4 and appropriate throughout the examination. Cardiopulmonary assessment is negative for acute distress and he exhibits normal effort.   Lab Findings: Lab Results  Component Value Date   WBC 5.8 05/08/2016   HGB 14.2 05/08/2016   HCT 42.9 05/08/2016   MCV 91.1 05/08/2016   PLT 143 (L) 05/08/2016     Radiographic Findings: Ct Cervical Spine W Contrast  Result Date: 01/10/2017 CLINICAL DATA:  Neck and thoracic pain. Low back pain. Lumbar radiculopathy with right leg pain and numbness. Prior lumbar fusion. FLUOROSCOPY TIME:  Radiation Exposure Index (as provided by the fluoroscopic device): 1340.79 microGray*m^2 Fluoroscopy Time (in minutes and seconds):  1 minute 18 seconds PROCEDURE: CERVICAL, THORACIC, AND LUMBAR MYELOGRAM CT CERVICAL MYELOGRAM CT LUMBAR MYELOGRAM Lumbar puncture and intrathecal contrast administration were performed by Dr. Ellene Route who will separately report for the portion of the procedure. I personally supervised acquisition of the myelogram images. TECHNIQUE: Contiguous axial CT images were obtained through the Cervical and Lumbar spine after the intrathecal infusion of contrast. Coronal and sagittal reconstructions were obtained of the axial image sets. COMPARISON:  Cervical spine MRI 12/18/2007. Lumbar spine MRI 10/01/2015 and lumbar myelogram 09/07/2015. FINDINGS: CERVICAL, THORACIC, AND LUMBAR MYELOGRAM  FINDINGS: Transitional lumbosacral anatomy with partial lumbarization of S1, consistent with the numbering employed on the prior MRI and myelogram. Prior L2-S1 posterior fusion with interval removal of the right-sided pedicle screws. Chronic grade 2 anterolisthesis of L5 on S1 without significant change during flexion or extension. The thecal sac appears adequately patent from L2-S1. There is advanced disc degeneration at L1-2 with vacuum disc, prominent endplate sclerosis, and slight focal kyphosis. Ventral extradural defect at L1-2 results in mild spinal stenosis. Disc space narrowing at L1-2 increases with standing, although the degree of spinal stenosis does not grossly change. There is good subarachnoid opacification in the mid and lower thoracic spine without evidence of significant spinal stenosis. The upper thoracic  spine was less well evaluated due to less concentrated contrast as well as overlapping structures. Dr. Ellene Route and I felt it was not necessary to perform a full post myelogram thoracic spine CT given the patient's symptoms and adequate visualization of most of the thoracic spine on the conventional myelogram. Subarachnoid contrast in the cervical spine was faint, limiting evaluation though without gross spinal stenosis. Lower cervical disc degeneration is advanced at C5-6. Cervical alignment was normal with the patient prone in the neck extended. CT CERVICAL MYELOGRAM FINDINGS: Unchanged alignment compared to the prior myelogram with straightening/ slight reversal of the normal lordosis in the lower cervical spine and slight anterolisthesis of C4 on C5, C7 on T1, T1 on T2, and T2 on T3. No evidence of fracture or destructive osseous lesion. Normal spinal cord morphology. Mild calcified plaque at the left carotid bifurcation. Motion artifact through the lung apices though with evidence of mild ground-glass opacity and mild interlobular septal thickening. C2-3: Left greater than right facet  arthrosis with left-sided posterior element fusion as previously seen. No stenosis. C3-4: Mild disc bulging, right uncovertebral spurring, and severe right facet arthrosis result in severe right neural foraminal stenosis without spinal stenosis, unchanged. Potential right C4 nerve root impingement. C4-5: Small central disc protrusion without spinal stenosis, unchanged. Mild right and severe left facet arthrosis result in moderate left neural foraminal stenosis, unchanged and with potential left C5 nerve root impingement. C5-6: Severe disc space narrowing with prominent degenerative endplate changes and vacuum disc. Diffuse endplate osteophyte formation flattens the ventral thecal sac and results in moderate bilateral neural foraminal stenosis without significant spinal stenosis, unchanged. C6-7: Severe disc space narrowing with degenerative endplate changes, slightly less advanced than at C5-6. Diffuse endplate osteophyte scratched a broad-based posterior disc osteophyte complex flattens the ventral thecal sac and results in moderate bilateral neural foraminal stenosis without significant spinal stenosis, unchanged C7-T1: Mild right and moderate left facet arthrosis without significant stenosis, unchanged. T1-2: Listhesis with mild disc uncovering and mild bilateral facet arthrosis without significant stenosis, unchanged. T2-3: Moderate disc space narrowing. Listhesis with mild disc uncovering and mild to moderate facet arthrosis without significant stenosis, unchanged. CT LUMBAR MYELOGRAM FINDINGS: Transitional lumbosacral anatomy with partial lumbarization of S1. Mild lower lumbar levoscoliosis. Chronic grade 2 anterolisthesis of L5 on S1 measuring 12 mm, unchanged from the prior myelogram. There is new slight anterior vertebral body height loss at L1 along the inferior endplate which appears degenerative and results in a slight focal kyphosis at L1-2. There is also subtle anterior depression of the L2 superior  endplate. Sequelae of L2-S1 fusion are again identified. All of the right-sided pedicle screws have been removed in the interim. Left-sided pedicle screws remain in place without evidence of loosening. The left L2 screw breaches the inferior cortex of the pedicle and slightly traverses the superior aspect of the neural foramen, unchanged and without evidence of left L2 nerve root impingement. There is solid interbody osseous fusion from L2-S1, and there is also a solid left-sided posterolateral osseous fusion mass from L2-S1. The conus medullaris terminates at L1. A small amount of subdural contrast is present in the mid lumbar spine, predominantly at L3-4 related to the injection. Somewhat clumped appearance of the cauda equina nerve roots at L3-4, predominantly on the right, is favored to be artifactual and related to displacement by the subdural contrast given the lack of evidence of arachnoiditis elsewhere. There is a large sliding hiatal hernia, incompletely imaged. Abdominal aortic atherosclerosis is noted without aneurysm. T9-10: Shallow  broad left paracentral disc protrusion flattens the ventral thecal sac but does not result in significant spinal stenosis or spinal cord mass effect. T10-11: Mild facet arthrosis result in mild right neural foraminal narrowing. No spinal stenosis. T11-12:  Minimal facet arthrosis without stenosis. T12-L1:  Negative. L1-2: Advanced disc degeneration with progressive sclerotic endplate changes, small Schmorl's nodes, and prominent vacuum disc. Progressive circumferential disc bulging and mild facet and ligamentum flavum hypertrophy result in progressive mild bilateral lateral recess stenosis, mild spinal stenosis, and mild left neural foraminal stenosis. L2-3: Wide posterior decompression and fusion. No stenosis. Unchanged punctate calcification or bone fragment in the right lateral recess which does not clearly contact the adjacent right L2 nerve roots. L3-4:  Wide posterior  decompression and fusion.  No stenosis. L4-5: Wide posterior decompression and fusion. No stenosis. Punctate calcification or osseous fragment in the right lateral recess is unchanged, potentially contacting the adjacent right L4 nerve roots. L5-S1: Wide posterior decompression and fusion. Widely patent spinal canal and left neural foramen. Mild narrowing of the right neural foramen in the craniocaudal dimension is unchanged. S1-2:  No stenosis. IMPRESSION: 1. Advanced cervical disc degeneration at C5-6 and C6-7 with moderate bilateral foraminal stenosis, unchanged. No spinal stenosis. 2. Advanced mid upper cervical facet arthrosis with severe right foraminal stenosis at C3-4 and moderate left foraminal stenosis at C4-5, unchanged. 3. Changes in the lung apices which may reflect mild edema. 4. Progressive, advanced disc degeneration at L1-2 with mild bilateral lateral recess stenosis, mild spinal stenosis, and mild left neural foraminal stenosis. 5. Solid L2-S1 fusion without evidence of significant residual stenosis. Unchanged punctate calcifications or tiny osseous fragments in the right lateral recess at L2-3 and L4-5. Electronically Signed   By: Logan Bores M.D.   On: 01/10/2017 11:12   Ct Lumbar Spine W Contrast  Result Date: 01/10/2017 CLINICAL DATA:  Neck and thoracic pain. Low back pain. Lumbar radiculopathy with right leg pain and numbness. Prior lumbar fusion. FLUOROSCOPY TIME:  Radiation Exposure Index (as provided by the fluoroscopic device): 1340.79 microGray*m^2 Fluoroscopy Time (in minutes and seconds):  1 minute 18 seconds PROCEDURE: CERVICAL, THORACIC, AND LUMBAR MYELOGRAM CT CERVICAL MYELOGRAM CT LUMBAR MYELOGRAM Lumbar puncture and intrathecal contrast administration were performed by Dr. Ellene Route who will separately report for the portion of the procedure. I personally supervised acquisition of the myelogram images. TECHNIQUE: Contiguous axial CT images were obtained through the Cervical and  Lumbar spine after the intrathecal infusion of contrast. Coronal and sagittal reconstructions were obtained of the axial image sets. COMPARISON:  Cervical spine MRI 12/18/2007. Lumbar spine MRI 10/01/2015 and lumbar myelogram 09/07/2015. FINDINGS: CERVICAL, THORACIC, AND LUMBAR MYELOGRAM FINDINGS: Transitional lumbosacral anatomy with partial lumbarization of S1, consistent with the numbering employed on the prior MRI and myelogram. Prior L2-S1 posterior fusion with interval removal of the right-sided pedicle screws. Chronic grade 2 anterolisthesis of L5 on S1 without significant change during flexion or extension. The thecal sac appears adequately patent from L2-S1. There is advanced disc degeneration at L1-2 with vacuum disc, prominent endplate sclerosis, and slight focal kyphosis. Ventral extradural defect at L1-2 results in mild spinal stenosis. Disc space narrowing at L1-2 increases with standing, although the degree of spinal stenosis does not grossly change. There is good subarachnoid opacification in the mid and lower thoracic spine without evidence of significant spinal stenosis. The upper thoracic spine was less well evaluated due to less concentrated contrast as well as overlapping structures. Dr. Ellene Route and I felt it was not necessary to  perform a full post myelogram thoracic spine CT given the patient's symptoms and adequate visualization of most of the thoracic spine on the conventional myelogram. Subarachnoid contrast in the cervical spine was faint, limiting evaluation though without gross spinal stenosis. Lower cervical disc degeneration is advanced at C5-6. Cervical alignment was normal with the patient prone in the neck extended. CT CERVICAL MYELOGRAM FINDINGS: Unchanged alignment compared to the prior myelogram with straightening/ slight reversal of the normal lordosis in the lower cervical spine and slight anterolisthesis of C4 on C5, C7 on T1, T1 on T2, and T2 on T3. No evidence of fracture or  destructive osseous lesion. Normal spinal cord morphology. Mild calcified plaque at the left carotid bifurcation. Motion artifact through the lung apices though with evidence of mild ground-glass opacity and mild interlobular septal thickening. C2-3: Left greater than right facet arthrosis with left-sided posterior element fusion as previously seen. No stenosis. C3-4: Mild disc bulging, right uncovertebral spurring, and severe right facet arthrosis result in severe right neural foraminal stenosis without spinal stenosis, unchanged. Potential right C4 nerve root impingement. C4-5: Small central disc protrusion without spinal stenosis, unchanged. Mild right and severe left facet arthrosis result in moderate left neural foraminal stenosis, unchanged and with potential left C5 nerve root impingement. C5-6: Severe disc space narrowing with prominent degenerative endplate changes and vacuum disc. Diffuse endplate osteophyte formation flattens the ventral thecal sac and results in moderate bilateral neural foraminal stenosis without significant spinal stenosis, unchanged. C6-7: Severe disc space narrowing with degenerative endplate changes, slightly less advanced than at C5-6. Diffuse endplate osteophyte scratched a broad-based posterior disc osteophyte complex flattens the ventral thecal sac and results in moderate bilateral neural foraminal stenosis without significant spinal stenosis, unchanged C7-T1: Mild right and moderate left facet arthrosis without significant stenosis, unchanged. T1-2: Listhesis with mild disc uncovering and mild bilateral facet arthrosis without significant stenosis, unchanged. T2-3: Moderate disc space narrowing. Listhesis with mild disc uncovering and mild to moderate facet arthrosis without significant stenosis, unchanged. CT LUMBAR MYELOGRAM FINDINGS: Transitional lumbosacral anatomy with partial lumbarization of S1. Mild lower lumbar levoscoliosis. Chronic grade 2 anterolisthesis of L5 on S1  measuring 12 mm, unchanged from the prior myelogram. There is new slight anterior vertebral body height loss at L1 along the inferior endplate which appears degenerative and results in a slight focal kyphosis at L1-2. There is also subtle anterior depression of the L2 superior endplate. Sequelae of L2-S1 fusion are again identified. All of the right-sided pedicle screws have been removed in the interim. Left-sided pedicle screws remain in place without evidence of loosening. The left L2 screw breaches the inferior cortex of the pedicle and slightly traverses the superior aspect of the neural foramen, unchanged and without evidence of left L2 nerve root impingement. There is solid interbody osseous fusion from L2-S1, and there is also a solid left-sided posterolateral osseous fusion mass from L2-S1. The conus medullaris terminates at L1. A small amount of subdural contrast is present in the mid lumbar spine, predominantly at L3-4 related to the injection. Somewhat clumped appearance of the cauda equina nerve roots at L3-4, predominantly on the right, is favored to be artifactual and related to displacement by the subdural contrast given the lack of evidence of arachnoiditis elsewhere. There is a large sliding hiatal hernia, incompletely imaged. Abdominal aortic atherosclerosis is noted without aneurysm. T9-10: Shallow broad left paracentral disc protrusion flattens the ventral thecal sac but does not result in significant spinal stenosis or spinal cord mass effect. T10-11: Mild  facet arthrosis result in mild right neural foraminal narrowing. No spinal stenosis. T11-12:  Minimal facet arthrosis without stenosis. T12-L1:  Negative. L1-2: Advanced disc degeneration with progressive sclerotic endplate changes, small Schmorl's nodes, and prominent vacuum disc. Progressive circumferential disc bulging and mild facet and ligamentum flavum hypertrophy result in progressive mild bilateral lateral recess stenosis, mild spinal  stenosis, and mild left neural foraminal stenosis. L2-3: Wide posterior decompression and fusion. No stenosis. Unchanged punctate calcification or bone fragment in the right lateral recess which does not clearly contact the adjacent right L2 nerve roots. L3-4:  Wide posterior decompression and fusion.  No stenosis. L4-5: Wide posterior decompression and fusion. No stenosis. Punctate calcification or osseous fragment in the right lateral recess is unchanged, potentially contacting the adjacent right L4 nerve roots. L5-S1: Wide posterior decompression and fusion. Widely patent spinal canal and left neural foramen. Mild narrowing of the right neural foramen in the craniocaudal dimension is unchanged. S1-2:  No stenosis. IMPRESSION: 1. Advanced cervical disc degeneration at C5-6 and C6-7 with moderate bilateral foraminal stenosis, unchanged. No spinal stenosis. 2. Advanced mid upper cervical facet arthrosis with severe right foraminal stenosis at C3-4 and moderate left foraminal stenosis at C4-5, unchanged. 3. Changes in the lung apices which may reflect mild edema. 4. Progressive, advanced disc degeneration at L1-2 with mild bilateral lateral recess stenosis, mild spinal stenosis, and mild left neural foraminal stenosis. 5. Solid L2-S1 fusion without evidence of significant residual stenosis. Unchanged punctate calcifications or tiny osseous fragments in the right lateral recess at L2-3 and L4-5. Electronically Signed   By: Logan Bores M.D.   On: 01/10/2017 11:12   Dg Myelogram 2+ Regions  Result Date: 01/10/2017 CLINICAL DATA:  Neck and thoracic pain. Low back pain. Lumbar radiculopathy with right leg pain and numbness. Prior lumbar fusion. FLUOROSCOPY TIME:  Radiation Exposure Index (as provided by the fluoroscopic device): 1340.79 microGray*m^2 Fluoroscopy Time (in minutes and seconds):  1 minute 18 seconds PROCEDURE: CERVICAL, THORACIC, AND LUMBAR MYELOGRAM CT CERVICAL MYELOGRAM CT LUMBAR MYELOGRAM Lumbar  puncture and intrathecal contrast administration were performed by Dr. Ellene Route who will separately report for the portion of the procedure. I personally supervised acquisition of the myelogram images. TECHNIQUE: Contiguous axial CT images were obtained through the Cervical and Lumbar spine after the intrathecal infusion of contrast. Coronal and sagittal reconstructions were obtained of the axial image sets. COMPARISON:  Cervical spine MRI 12/18/2007. Lumbar spine MRI 10/01/2015 and lumbar myelogram 09/07/2015. FINDINGS: CERVICAL, THORACIC, AND LUMBAR MYELOGRAM FINDINGS: Transitional lumbosacral anatomy with partial lumbarization of S1, consistent with the numbering employed on the prior MRI and myelogram. Prior L2-S1 posterior fusion with interval removal of the right-sided pedicle screws. Chronic grade 2 anterolisthesis of L5 on S1 without significant change during flexion or extension. The thecal sac appears adequately patent from L2-S1. There is advanced disc degeneration at L1-2 with vacuum disc, prominent endplate sclerosis, and slight focal kyphosis. Ventral extradural defect at L1-2 results in mild spinal stenosis. Disc space narrowing at L1-2 increases with standing, although the degree of spinal stenosis does not grossly change. There is good subarachnoid opacification in the mid and lower thoracic spine without evidence of significant spinal stenosis. The upper thoracic spine was less well evaluated due to less concentrated contrast as well as overlapping structures. Dr. Ellene Route and I felt it was not necessary to perform a full post myelogram thoracic spine CT given the patient's symptoms and adequate visualization of most of the thoracic spine on the conventional myelogram. Subarachnoid  contrast in the cervical spine was faint, limiting evaluation though without gross spinal stenosis. Lower cervical disc degeneration is advanced at C5-6. Cervical alignment was normal with the patient prone in the neck  extended. CT CERVICAL MYELOGRAM FINDINGS: Unchanged alignment compared to the prior myelogram with straightening/ slight reversal of the normal lordosis in the lower cervical spine and slight anterolisthesis of C4 on C5, C7 on T1, T1 on T2, and T2 on T3. No evidence of fracture or destructive osseous lesion. Normal spinal cord morphology. Mild calcified plaque at the left carotid bifurcation. Motion artifact through the lung apices though with evidence of mild ground-glass opacity and mild interlobular septal thickening. C2-3: Left greater than right facet arthrosis with left-sided posterior element fusion as previously seen. No stenosis. C3-4: Mild disc bulging, right uncovertebral spurring, and severe right facet arthrosis result in severe right neural foraminal stenosis without spinal stenosis, unchanged. Potential right C4 nerve root impingement. C4-5: Small central disc protrusion without spinal stenosis, unchanged. Mild right and severe left facet arthrosis result in moderate left neural foraminal stenosis, unchanged and with potential left C5 nerve root impingement. C5-6: Severe disc space narrowing with prominent degenerative endplate changes and vacuum disc. Diffuse endplate osteophyte formation flattens the ventral thecal sac and results in moderate bilateral neural foraminal stenosis without significant spinal stenosis, unchanged. C6-7: Severe disc space narrowing with degenerative endplate changes, slightly less advanced than at C5-6. Diffuse endplate osteophyte scratched a broad-based posterior disc osteophyte complex flattens the ventral thecal sac and results in moderate bilateral neural foraminal stenosis without significant spinal stenosis, unchanged C7-T1: Mild right and moderate left facet arthrosis without significant stenosis, unchanged. T1-2: Listhesis with mild disc uncovering and mild bilateral facet arthrosis without significant stenosis, unchanged. T2-3: Moderate disc space narrowing.  Listhesis with mild disc uncovering and mild to moderate facet arthrosis without significant stenosis, unchanged. CT LUMBAR MYELOGRAM FINDINGS: Transitional lumbosacral anatomy with partial lumbarization of S1. Mild lower lumbar levoscoliosis. Chronic grade 2 anterolisthesis of L5 on S1 measuring 12 mm, unchanged from the prior myelogram. There is new slight anterior vertebral body height loss at L1 along the inferior endplate which appears degenerative and results in a slight focal kyphosis at L1-2. There is also subtle anterior depression of the L2 superior endplate. Sequelae of L2-S1 fusion are again identified. All of the right-sided pedicle screws have been removed in the interim. Left-sided pedicle screws remain in place without evidence of loosening. The left L2 screw breaches the inferior cortex of the pedicle and slightly traverses the superior aspect of the neural foramen, unchanged and without evidence of left L2 nerve root impingement. There is solid interbody osseous fusion from L2-S1, and there is also a solid left-sided posterolateral osseous fusion mass from L2-S1. The conus medullaris terminates at L1. A small amount of subdural contrast is present in the mid lumbar spine, predominantly at L3-4 related to the injection. Somewhat clumped appearance of the cauda equina nerve roots at L3-4, predominantly on the right, is favored to be artifactual and related to displacement by the subdural contrast given the lack of evidence of arachnoiditis elsewhere. There is a large sliding hiatal hernia, incompletely imaged. Abdominal aortic atherosclerosis is noted without aneurysm. T9-10: Shallow broad left paracentral disc protrusion flattens the ventral thecal sac but does not result in significant spinal stenosis or spinal cord mass effect. T10-11: Mild facet arthrosis result in mild right neural foraminal narrowing. No spinal stenosis. T11-12:  Minimal facet arthrosis without stenosis. T12-L1:  Negative.  L1-2: Advanced disc  degeneration with progressive sclerotic endplate changes, small Schmorl's nodes, and prominent vacuum disc. Progressive circumferential disc bulging and mild facet and ligamentum flavum hypertrophy result in progressive mild bilateral lateral recess stenosis, mild spinal stenosis, and mild left neural foraminal stenosis. L2-3: Wide posterior decompression and fusion. No stenosis. Unchanged punctate calcification or bone fragment in the right lateral recess which does not clearly contact the adjacent right L2 nerve roots. L3-4:  Wide posterior decompression and fusion.  No stenosis. L4-5: Wide posterior decompression and fusion. No stenosis. Punctate calcification or osseous fragment in the right lateral recess is unchanged, potentially contacting the adjacent right L4 nerve roots. L5-S1: Wide posterior decompression and fusion. Widely patent spinal canal and left neural foramen. Mild narrowing of the right neural foramen in the craniocaudal dimension is unchanged. S1-2:  No stenosis. IMPRESSION: 1. Advanced cervical disc degeneration at C5-6 and C6-7 with moderate bilateral foraminal stenosis, unchanged. No spinal stenosis. 2. Advanced mid upper cervical facet arthrosis with severe right foraminal stenosis at C3-4 and moderate left foraminal stenosis at C4-5, unchanged. 3. Changes in the lung apices which may reflect mild edema. 4. Progressive, advanced disc degeneration at L1-2 with mild bilateral lateral recess stenosis, mild spinal stenosis, and mild left neural foraminal stenosis. 5. Solid L2-S1 fusion without evidence of significant residual stenosis. Unchanged punctate calcifications or tiny osseous fragments in the right lateral recess at L2-3 and L4-5. Electronically Signed   By: Logan Bores M.D.   On: 01/10/2017 11:12    Impression/Plan: 1. Stage T1cadenocarcinoma of the prostate with a Gleason's score of 4+4and a PSA of 4.82. I have advised that he can resume taking his Flomax  once daily. He has a follow-up appointment with Dr. Junious Silk on March 12 at 10 AM. We discussed expectations regarding PSA monitoring going forward. He understands that this will be followed closely, every 3-6 months for the first 2 years with his urologist, Dr. Junious Silk. We discussed the expectation of a gradual decline in the PSA over a period of 1-2 years before we reach [place]. We are happy to continue to participate in his care as needed. He knows to call the office at any time with any questions or concerns felt related to his radiotherapy.   Nicholos Johns, PA-C

## 2017-02-12 DIAGNOSIS — R351 Nocturia: Secondary | ICD-10-CM | POA: Diagnosis not present

## 2017-02-12 DIAGNOSIS — N401 Enlarged prostate with lower urinary tract symptoms: Secondary | ICD-10-CM | POA: Diagnosis not present

## 2017-02-12 DIAGNOSIS — C61 Malignant neoplasm of prostate: Secondary | ICD-10-CM | POA: Diagnosis not present

## 2017-02-13 ENCOUNTER — Ambulatory Visit (INDEPENDENT_AMBULATORY_CARE_PROVIDER_SITE_OTHER): Payer: Medicare Other | Admitting: Gastroenterology

## 2017-02-13 ENCOUNTER — Encounter (INDEPENDENT_AMBULATORY_CARE_PROVIDER_SITE_OTHER): Payer: Self-pay

## 2017-02-13 ENCOUNTER — Encounter: Payer: Self-pay | Admitting: Gastroenterology

## 2017-02-13 VITALS — BP 120/58 | HR 66 | Ht 67.0 in | Wt 185.0 lb

## 2017-02-13 DIAGNOSIS — Q2733 Arteriovenous malformation of digestive system vessel: Secondary | ICD-10-CM | POA: Diagnosis not present

## 2017-02-13 DIAGNOSIS — K2270 Barrett's esophagus without dysplasia: Secondary | ICD-10-CM

## 2017-02-13 DIAGNOSIS — K219 Gastro-esophageal reflux disease without esophagitis: Secondary | ICD-10-CM | POA: Diagnosis not present

## 2017-02-13 DIAGNOSIS — K5520 Angiodysplasia of colon without hemorrhage: Secondary | ICD-10-CM

## 2017-02-13 MED ORDER — RANITIDINE HCL 300 MG PO TABS: 300.0000 mg | ORAL_TABLET | Freq: Every day | ORAL | 3 refills | Status: DC

## 2017-02-13 MED ORDER — OMEPRAZOLE 40 MG PO CPDR: 40.0000 mg | DELAYED_RELEASE_CAPSULE | Freq: Two times a day (BID) | ORAL | 3 refills | Status: DC

## 2017-02-13 NOTE — Progress Notes (Signed)
    History of Present Illness: This is an 83 year old male with GERD, Barrett's. He is accompanied by his wife. He states he has a frequent cough and is concerned it is caused by reflux. He has no gastrointestinal complaints. He states his cough is bothersome about 3 times per week. Does not appear this has been further evaluated by his PCP and he relates he has an upcoming appointment. The patient and his wife relate that their son was recently diagnosed with Barrett's esophagus. They have several questions about Barrett's surveillance and treatment.  Current Medications, Allergies, Past Medical History, Past Surgical History, Family History and Social History were reviewed in [employer] record.  Physical Exam: General: Well developed, well nourished, no acute distress Head: Normocephalic and atraumatic Eyes:  sclerae anicteric, EOMI Ears: Normal auditory acuity Mouth: No deformity or lesions Lungs: Clear throughout to auscultation Heart: Regular rate and rhythm; no murmurs, rubs or bruits Abdomen: Soft, non tender and non distended. No masses, hepatosplenomegaly or hernias noted. Normal Bowel sounds Musculoskeletal: Symmetrical with no gross deformities  Pulses:  Normal pulses noted Extremities: No clubbing, cyanosis, edema or deformities noted Neurological: Alert oriented x 4, grossly nonfocal Psychological:  Alert and cooperative. Normal mood and affect  Assessment and Recommendations:  1. 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 Last EGD in 09/2013 showed 3 cm of Barrett's and biopsies were without dysplasia. We discussed that current guideline recommend discontinuing Barrett's surveillance around age 72 however that is a guideline and if he wished to proceed with another EGD to evaluate Barrett's that is reasonable. He will consider and contact us if he wants to proceed. Advised him to discuss his chronic cough with his PCP and to consider further evaluation of those  symptoms. Cough could be related to LPR. Continue omeprazole 40 mg twice daily ac and ranitidine 300 mg at bedtime.  2. 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 Maintained on Fe.  PCP following CBCs.   I spent 15 minutes of face-to-face time with the patient. Greater than 50% of the time was spent counseling and coordinating care.

## 2017-02-13 NOTE — Patient Instructions (Signed)
We have sent the following medications to your pharmacy for you to pick up at your convenience: omeprazole and ranitidine.   Thank you for choosing me and [facility].  Pricilla Riffle. Dagoberto Ligas., MD., [facility]

## 2017-02-22 DIAGNOSIS — C61 Malignant neoplasm of prostate: Secondary | ICD-10-CM | POA: Diagnosis not present

## 2017-02-22 DIAGNOSIS — G4733 Obstructive sleep apnea (adult) (pediatric): Secondary | ICD-10-CM | POA: Diagnosis not present

## 2017-02-22 DIAGNOSIS — M545 Low back pain: Secondary | ICD-10-CM | POA: Diagnosis not present

## 2017-02-22 DIAGNOSIS — R05 Cough: Secondary | ICD-10-CM | POA: Diagnosis not present

## 2017-02-22 DIAGNOSIS — Z6829 Body mass index (BMI) 29.0-29.9, adult: Secondary | ICD-10-CM | POA: Diagnosis not present

## 2017-02-22 DIAGNOSIS — H6123 Impacted cerumen, bilateral: Secondary | ICD-10-CM | POA: Diagnosis not present

## 2017-03-08 DIAGNOSIS — M810 Age-related osteoporosis without current pathological fracture: Secondary | ICD-10-CM | POA: Diagnosis not present

## 2017-03-08 DIAGNOSIS — Z6829 Body mass index (BMI) 29.0-29.9, adult: Secondary | ICD-10-CM | POA: Diagnosis not present

## 2017-03-27 ENCOUNTER — Encounter (HOSPITAL_COMMUNITY): Payer: Self-pay

## 2017-03-27 ENCOUNTER — Ambulatory Visit (HOSPITAL_COMMUNITY)
Admission: RE | Admit: 2017-03-27 | Discharge: 2017-03-27 | Disposition: A | Discharge status: Home / Self Care | Payer: Medicare Other | Source: Ambulatory Visit | Attending: Internal Medicine | Admitting: Internal Medicine

## 2017-03-27 DIAGNOSIS — M810 Age-related osteoporosis without current pathological fracture: Secondary | ICD-10-CM | POA: Insufficient documentation

## 2017-03-27 HISTORY — DX: Personal history of irradiation: Z92.3

## 2017-03-27 HISTORY — DX: Nasal congestion: R09.81

## 2017-03-27 MED ORDER — SODIUM CHLORIDE 0.9 % IV SOLN
Freq: Once | INTRAVENOUS | Status: AC
  Administered 2017-03-27: 12:00:00 via INTRAVENOUS

## 2017-03-27 MED ORDER — ZOLEDRONIC ACID 5 MG/100ML IV SOLN
5.0000 mg | Freq: Once | INTRAVENOUS | Status: AC
  Administered 2017-03-27: 5 mg via INTRAVENOUS
  Filled 2017-03-27: qty 100

## 2017-03-27 NOTE — Discharge Instructions (Signed)
Continue taking your vitamin D and Calcium as directed.      Zoledronic Acid injection (Paget's Disease, Osteoporosis)/Reclast Infusion  What is this medicine? ZOLEDRONIC ACID (ZOE le dron ik AS id) lowers the amount of calcium loss from bone. It is used to treat Paget's disease and osteoporosis in women. This medicine may be used for other purposes; ask your health care provider or pharmacist if you have questions. COMMON BRAND NAME(S): Reclast, Zometa What should I tell my health care provider before I take this medicine? They need to know if you have any of these conditions: -aspirin-sensitive asthma -cancer, especially if you are receiving medicines used to treat cancer -dental disease or wear dentures -infection -kidney disease -low levels of calcium in the blood -past surgery on the parathyroid gland or intestines -receiving corticosteroids like dexamethasone or prednisone -an unusual or allergic reaction to zoledronic acid, other medicines, foods, dyes, or preservatives -pregnant or trying to get pregnant -breast-feeding How should I use this medicine? This medicine is for infusion into a vein. It is given by a health care professional in a hospital or clinic setting. Talk to your pediatrician regarding the use of this medicine in children. This medicine is not approved for use in children. Overdosage: If you think you have taken too much of this medicine contact a poison control center or emergency room at once. NOTE: This medicine is only for you. Do not share this medicine with others. What if I miss a dose? It is important not to miss your dose. Call your doctor or health care professional if you are unable to keep an appointment. What may interact with this medicine? -certain antibiotics given by injection -NSAIDs, medicines for pain and inflammation, like ibuprofen or naproxen -some diuretics like bumetanide, furosemide -teriparatide This list may not describe all  possible interactions. Give your health care provider a list of all the medicines, herbs, non-prescription drugs, or dietary supplements you use. Also tell them if you smoke, drink alcohol, or use illegal drugs. Some items may interact with your medicine. What should I watch for while using this medicine? Visit your doctor or health care professional for regular checkups. It may be some time before you see the benefit from this medicine. Do not stop taking your medicine unless your doctor tells you to. Your doctor may order blood tests or other tests to see how you are doing. Women should inform their doctor if they wish to become pregnant or think they might be pregnant. There is a potential for serious side effects to an unborn child. Talk to your health care professional or pharmacist for more information. You should make sure that you get enough calcium and vitamin D while you are taking this medicine. Discuss the foods you eat and the vitamins you take with your health care professional. Some people who take this medicine have severe bone, joint, and/or muscle pain. This medicine may also increase your risk for jaw problems or a broken thigh bone. Tell your doctor right away if you have severe pain in your jaw, bones, joints, or muscles. Tell your doctor if you have any pain that does not go away or that gets worse. Tell your dentist and dental surgeon that you are taking this medicine. You should not have major dental surgery while on this medicine. See your dentist to have a dental exam and fix any dental problems before starting this medicine. Take good care of your teeth while on this medicine. Make sure you see  your dentist for regular follow-up appointments. What side effects may I notice from receiving this medicine? Side effects that you should report to your doctor or health care professional as soon as possible: -allergic reactions like skin rash, itching or hives, swelling of the face, lips,  or tongue -anxiety, confusion, or depression -breathing problems -changes in vision -eye pain -feeling faint or lightheaded, falls -jaw pain, especially after dental work -mouth sores -muscle cramps, stiffness, or weakness -redness, blistering, peeling or loosening of the skin, including inside the mouth -trouble passing urine or change in the amount of urine Side effects that usually do not require medical attention (report to your doctor or health care professional if they continue or are bothersome): -bone, joint, or muscle pain -constipation -diarrhea -fever -hair loss -irritation at site where injected -loss of appetite -nausea, vomiting -stomach upset -trouble sleeping -trouble swallowing -weak or tired This list may not describe all possible side effects. Call your doctor for medical advice about side effects. You may report side effects to FDA at 1-800-FDA-1088. Where should I keep my medicine? This drug is given in a hospital or clinic and will not be stored at home. NOTE: This sheet is a summary. It may not cover all possible information. If you have questions about this medicine, talk to your doctor, pharmacist, or health care provider.  2018 Elsevier/Gold Standard (2014-04-18 14:19:57)

## 2017-04-03 ENCOUNTER — Telehealth (INDEPENDENT_AMBULATORY_CARE_PROVIDER_SITE_OTHER): Payer: Self-pay | Admitting: Physical Medicine and Rehabilitation

## 2017-04-03 NOTE — Telephone Encounter (Signed)
Decide if ok to do.

## 2017-04-03 NOTE — Telephone Encounter (Signed)
Scheduled for 04/11/17 at 3 pm.

## 2017-04-11 ENCOUNTER — Ambulatory Visit (INDEPENDENT_AMBULATORY_CARE_PROVIDER_SITE_OTHER): Payer: Medicare Other | Admitting: Physical Medicine and Rehabilitation

## 2017-04-11 ENCOUNTER — Ambulatory Visit (INDEPENDENT_AMBULATORY_CARE_PROVIDER_SITE_OTHER): Payer: Medicare Other

## 2017-04-11 ENCOUNTER — Encounter (INDEPENDENT_AMBULATORY_CARE_PROVIDER_SITE_OTHER): Payer: Self-pay | Admitting: Physical Medicine and Rehabilitation

## 2017-04-11 VITALS — BP 135/69 | HR 59

## 2017-04-11 DIAGNOSIS — G894 Chronic pain syndrome: Secondary | ICD-10-CM

## 2017-04-11 DIAGNOSIS — M792 Neuralgia and neuritis, unspecified: Secondary | ICD-10-CM

## 2017-04-11 DIAGNOSIS — M961 Postlaminectomy syndrome, not elsewhere classified: Secondary | ICD-10-CM

## 2017-04-11 MED ORDER — BUPIVACAINE HCL 0.5 % IJ SOLN: 3.0000 mL | Freq: Once | INTRAMUSCULAR | Status: DC

## 2017-04-11 MED ORDER — LIDOCAINE HCL (PF) 1 % IJ SOLN: 2.0000 mL | Freq: Once | INTRAMUSCULAR | Status: DC

## 2017-04-11 MED ORDER — METHYLPREDNISOLONE ACETATE 80 MG/ML IJ SUSP: 80.0000 mg | Freq: Once | INTRAMUSCULAR | Status: DC

## 2017-04-11 NOTE — Progress Notes (Signed)
Charles [name] - 83 y.o. male MRN [id number]  Date of birth: [birth year]  Office Visit Note: Visit Date: 04/11/2017 PCP: Leanna Battles, MD Referred by: Leanna Battles, MD  Subjective: No chief complaint on file.  HPI: Charles [name] is an 83 year old gentleman accompanied by his wife who provides the history. We saw him recently for evaluation and at the time considered a diagnostic sacroiliac joint/piriformis type injection for what is basically recalcitrant right hip and leg pain with some paresthesia. He has had multiple lumbar surgeries and is essentially fused from L2-S1. His pain is predominately right-sided across the back into the hips sometimes in the groin area. He gets worse mostly with sitting. It also worsens as the day progresses. He has had multiple injections in the past without much relief. We will try diagnostic and hopefully therapeutic right piriformis/sacroiliac joint injection. Please see our prior evaluation and management note for further details and justification.    ROS Otherwise per HPI.  Assessment & Plan: Visit Diagnoses:  1. Neuritis   2. Chronic pain syndrome   3. Post laminectomy syndrome     Plan: Findings:  Right piriformis and sciatic nerve block with fluoroscopic guidance. We did initially obtain flow of contrast to make a arthrogram of the sacroiliac joint and medication was placed there as well.    Meds & Orders:  Meds ordered this encounter  Medications  . lidocaine (PF) (XYLOCAINE) 1 % injection 2 mL  . bupivacaine (MARCAINE) 0.5 % (with pres) injection 3 mL  . methylPREDNISolone acetate (DEPO-MEDROL) injection 80 mg    Orders Placed This Encounter  Procedures  . Nerve Block  . XR C-ARM NO REPORT    Follow-up: Return if symptoms worsen or fail to improve, for ? SCS trial.   Procedures: No procedures performed  Piriformis/Sciatic Nerve Block Injection - Posterior Approach with Fluoroscopic Guidance  Patient: Charles [name]      Date of Birth: 83 1932 MRN: [id number] PCP: Leanna Battles, MD      Visit Date: 04/11/2017   Universal Protocol:    Date/Time: 05/09/183:39 PM  Consent Given By: 83 the patient  Position: prone  Additional Comments: Vital signs were monitored before and after the procedure. Patient was prepped and draped in the usual sterile fashion. The correct patient, procedure, and site was verified.   Injection Procedure Details:  Procedure Site One Meds Administered:  Meds ordered this encounter  Medications  . lidocaine (PF) (XYLOCAINE) 1 % injection 2 mL  . bupivacaine (MARCAINE) 0.5 % (with pres) injection 3 mL  . methylPREDNISolone acetate (DEPO-MEDROL) injection 80 mg     Laterality: Right  Location/Site:  Right sacroiliac joint/piriformis posterior inferior  Needle size: 22 G  Needle type: spinal needle  Needle Placement: Initially placed in the bottom of the sacroiliac joint and then walked off to block the piriformis and sciatic nerve. Findings:  -Contrast Used: 1 mL iohexol 180 mg iodine/mL   -Comments: Excellent flow of contrast into the sacroiliac joint and flow contrast inferiorly without vascular uptake.  Procedure Details: Starting with a 90 degree vertical and midline orientation the fluoroscope was tilted cranially 20 to 25 degrees and the target area of the inferior most part of the SI joint on the side mentioned above was visualized.  The soft tissues overlying this target were infiltrated with 4 ml. of 1% Lidocaine without Epinephrine.  The spinal needle was inserted perpendicular to the fluoroscope table and advanced to the posterior inferior joint space and walked off  inferiorly using fluoroscopic guidance.    Position was confirmed by using a 2 ml. volume of Omnipaque-240 contrast agent. After negative aspirate for gross pus or blood, the injectate was delivered to the muscle and surrounding sciatic nerve. Radiographs were obtained for  documentation purposes.     Additional Comments:  The patient tolerated the procedure well Dressing: Band-Aid    Post-procedure details: Patient was observed during the procedure. Post-procedure instructions were reviewed.  Patient left the clinic in stable condition.    Clinical History: CT myelogram of the cervical and lumbar spine dated 01/10/2017  IMPRESSION: 1. Advanced cervical disc degeneration at C5-6 and C6-7 with moderate bilateral foraminal stenosis, unchanged. No spinal stenosis. 2. Advanced mid upper cervical facet arthrosis with severe right foraminal stenosis at C3-4 and moderate left foraminal stenosis at C4-5, unchanged. 3. Changes in the lung apices which may reflect mild edema. 4. Progressive, advanced disc degeneration at L1-2 with mild bilateral lateral recess stenosis, mild spinal stenosis, and mild left neural foraminal stenosis. 5. Solid L2-S1 fusion without evidence of significant residual stenosis. Unchanged punctate calcifications or tiny osseous fragments in the right lateral recess at L2-3 and L4-5.  He reports that he has never smoked. He has never used smokeless tobacco. No results for input(s): HGBA1C, LABURIC in the last 8760 hours.  Objective:  VS:  HT:    WT:   BMI:     BP:135/69  HR:(!) 59bpm  TEMP: ( )  RESP:96 % Physical Exam  Musculoskeletal:  Patient ambulates with forward flexion and obvious scoliosis. He has no pain with hip rotation. He has good distal strength.    Ortho Exam Imaging: No results found.  Past Medical/Family/Surgical/Social History: Medications & Allergies reviewed per EMR Patient Active Problem List   Diagnosis Date Noted  . Chronic bilateral low back pain with bilateral sciatica 01/17/2017  . Post laminectomy syndrome 01/17/2017  . Chronic pain syndrome 01/17/2017  . Sacroiliitis ([place]) 01/17/2017  . Piriformis syndrome of right side 01/17/2017  . Malignant neoplasm of prostate ([place]) 07/19/2016  .  Fixation hardware in spine 05/15/2016  . Hypoglycemic reaction 02/02/2015  . Tremor, essential 12/11/2014  . OSA on CPAP 04/24/2014  . 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 Hypersomnia with sleep apnea, unspecified 04/24/2014  . Depression 04/18/2013  . BPH (benign prostatic hyperplasia) 04/18/2013  . Impingement syndrome of right shoulder 04/14/2013    Class: Acute  . Scoliosis or kyphoscoliosis 04/08/2013    Class: Chronic  . Lumbosacral spondylosis without myelopathy 04/08/2013    Class: Chronic  . Acute respiratory failure ([place]) 04/08/2013  . HTN (hypertension) 04/08/2013  . Lymphocytosis   . HIATAL HERNIA 11/21/2010  . DIVERTICULAR DISEASE 11/21/2010  . BARRETT'S ESOPHAGUS 02/22/2010  . GERD 07/20/2008  . INGUINAL HERNIA, LEFT, SMALL 07/20/2008  . OTH VENTRAL HERN W/O MENTION OBSTRUCTION/GANGREN 07/20/2008  . CONSTIPATION 07/20/2008  . DYSPHAGIA 07/20/2008   Past Medical History:  Diagnosis Date  . Arthritis   . AVM (arteriovenous malformation)   . Barrett's esophagus   . BPH (benign prostatic hyperplasia)   . CAD (coronary artery disease)    only on stress test; never had MI, PCI or CABG.  In 2008, LVEF was reportedly normal.   . Cataract   . Constipation   . CPAP (continuous positive airway pressure) dependence   . Depression   . Diverticulosis   . Diverticulosis   . GERD (gastroesophageal reflux disease)   . History of radiation therapy   . Hyperlipidemia   .  Hypoglycemia   . Lymphocytosis 06/2012  . Mastodynia   . Murmur   . Nasal congestion   . Osteoporosis   . Osteoporosis   . Prostate cancer ([place])   . Sleep apnea    wears CPAP  . Urinary retention with incomplete bladder emptying    pt uses bathroom and within 30 mins needs to go again  . Vitamin D deficiency    Family History  Problem Relation Age of Onset  . Prostate cancer Father   . CAD Mother   . Arthritis-Osteo Brother    Past Surgical History:  Procedure Laterality Date  . BACK  SURGERY  04/2013  . back surgury     f  . BLEPHAROPLASTY Bilateral 02/23/15  . CATARACT EXTRACTION    . COLONOSCOPY    . EYE SURGERY Bilateral   . FINGER SURGERY Right    right thumb  . HARDWARE REMOVAL Right 05/15/2016   Procedure: Right Lumbar Two-Lumbar Five Removal of Hardware;  Surgeon: Kristeen Miss, MD;  Location: [facility];  Service: Neurosurgery;  Laterality: Right;  Right L2-5 removal of hardware  . INGUINAL HERNIA REPAIR     x4  . REPAIR SPIGELIAN HERNIA  2011  . SPINAL FUSION  1996  . TONSILLECTOMY    . UPPER GASTROINTESTINAL ENDOSCOPY    . VASECTOMY     Social History   Occupational History  . Retired Retired    retired Arboriculturist   Social History Main Topics  . Smoking status: Never Smoker  . Smokeless tobacco: Never Used  . Alcohol use No  . Drug use: No  . Sexual activity: No

## 2017-04-11 NOTE — Patient Instructions (Signed)

## 2017-04-11 NOTE — Progress Notes (Deleted)
Pain across back and worse on right side. Into hip and groin area radiating down leg.Worse with sitting. Gets worse as day goes on.

## 2017-04-11 NOTE — Procedures (Signed)
Piriformis/Sciatic Nerve Block Injection - Posterior Approach with Fluoroscopic Guidance  Patient: Charles [name]      Date of Birth: 02-16-1931 MRN: [id number] PCP: Leanna Battles, MD      Visit Date: 04/11/2017   Universal Protocol:    Date/Time: 05/09/183:39 PM  Consent Given By: the patient  Position: prone  Additional Comments: Vital signs were monitored before and after the procedure. Patient was prepped and draped in the usual sterile fashion. The correct patient, procedure, and site was verified.   Injection Procedure Details:  Procedure Site One Meds Administered:  Meds ordered this encounter  Medications  . lidocaine (PF) (XYLOCAINE) 1 % injection 2 mL  . bupivacaine (MARCAINE) 0.5 % (with pres) injection 3 mL  . methylPREDNISolone acetate (DEPO-MEDROL) injection 80 mg     Laterality: Right  Location/Site:  Right sacroiliac joint/piriformis posterior inferior  Needle size: 22 G  Needle type: spinal needle  Needle Placement: Initially placed in the bottom of the sacroiliac joint and then walked off to block the piriformis and sciatic nerve. Findings:  -Contrast Used: 1 mL iohexol 180 mg iodine/mL   -Comments: Excellent flow of contrast into the sacroiliac joint and flow contrast inferiorly without vascular uptake.  Procedure Details: Starting with a 90 degree vertical and midline orientation the fluoroscope was tilted cranially 20 to 25 degrees and the target area of the inferior most part of the SI joint on the side mentioned above was visualized.  The soft tissues overlying this target were infiltrated with 4 ml. of 1% Lidocaine without Epinephrine.  The spinal needle was inserted perpendicular to the fluoroscope table and advanced to the posterior inferior joint space and walked off inferiorly using fluoroscopic guidance.    Position was confirmed by using a 2 ml. volume of Omnipaque-240 contrast agent. After negative aspirate for gross pus or blood,  the injectate was delivered to the muscle and surrounding sciatic nerve. Radiographs were obtained for documentation purposes.     Additional Comments:  The patient tolerated the procedure well Dressing: Band-Aid    Post-procedure details: Patient was observed during the procedure. Post-procedure instructions were reviewed.  Patient left the clinic in stable condition.

## 2017-05-15 DIAGNOSIS — M545 Low back pain: Secondary | ICD-10-CM | POA: Diagnosis not present

## 2017-05-15 DIAGNOSIS — R7309 Other abnormal glucose: Secondary | ICD-10-CM | POA: Diagnosis not present

## 2017-05-15 DIAGNOSIS — M79671 Pain in right foot: Secondary | ICD-10-CM | POA: Diagnosis not present

## 2017-05-15 DIAGNOSIS — Z1389 Encounter for screening for other disorder: Secondary | ICD-10-CM | POA: Diagnosis not present

## 2017-05-15 DIAGNOSIS — R2681 Unsteadiness on feet: Secondary | ICD-10-CM | POA: Diagnosis not present

## 2017-05-15 DIAGNOSIS — Z6829 Body mass index (BMI) 29.0-29.9, adult: Secondary | ICD-10-CM | POA: Diagnosis not present

## 2017-05-31 DIAGNOSIS — Z6829 Body mass index (BMI) 29.0-29.9, adult: Secondary | ICD-10-CM | POA: Diagnosis not present

## 2017-05-31 DIAGNOSIS — M79671 Pain in right foot: Secondary | ICD-10-CM | POA: Diagnosis not present

## 2017-05-31 DIAGNOSIS — M545 Low back pain: Secondary | ICD-10-CM | POA: Diagnosis not present

## 2017-05-31 DIAGNOSIS — R2681 Unsteadiness on feet: Secondary | ICD-10-CM | POA: Diagnosis not present

## 2017-06-03 DIAGNOSIS — H532 Diplopia: Secondary | ICD-10-CM | POA: Diagnosis not present

## 2017-06-03 DIAGNOSIS — Z961 Presence of intraocular lens: Secondary | ICD-10-CM | POA: Diagnosis not present

## 2017-06-03 DIAGNOSIS — H1132 Conjunctival hemorrhage, left eye: Secondary | ICD-10-CM | POA: Diagnosis not present

## 2017-06-04 DIAGNOSIS — H1132 Conjunctival hemorrhage, left eye: Secondary | ICD-10-CM | POA: Diagnosis not present

## 2017-06-04 DIAGNOSIS — H532 Diplopia: Secondary | ICD-10-CM | POA: Diagnosis not present

## 2017-06-11 DIAGNOSIS — H1132 Conjunctival hemorrhage, left eye: Secondary | ICD-10-CM | POA: Diagnosis not present

## 2017-06-22 DIAGNOSIS — H1132 Conjunctival hemorrhage, left eye: Secondary | ICD-10-CM | POA: Diagnosis not present

## 2017-06-22 DIAGNOSIS — H1131 Conjunctival hemorrhage, right eye: Secondary | ICD-10-CM | POA: Diagnosis not present

## 2017-06-25 ENCOUNTER — Encounter: Payer: Self-pay | Admitting: Neurology

## 2017-06-25 ENCOUNTER — Ambulatory Visit (INDEPENDENT_AMBULATORY_CARE_PROVIDER_SITE_OTHER): Payer: Medicare Other | Admitting: Neurology

## 2017-06-25 VITALS — BP 127/62 | HR 58 | Ht 66.0 in | Wt 183.0 lb

## 2017-06-25 DIAGNOSIS — G2581 Restless legs syndrome: Secondary | ICD-10-CM | POA: Diagnosis not present

## 2017-06-25 DIAGNOSIS — G4733 Obstructive sleep apnea (adult) (pediatric): Secondary | ICD-10-CM | POA: Diagnosis not present

## 2017-06-25 DIAGNOSIS — C61 Malignant neoplasm of prostate: Secondary | ICD-10-CM | POA: Diagnosis not present

## 2017-06-25 DIAGNOSIS — Z9989 Dependence on other enabling machines and devices: Secondary | ICD-10-CM

## 2017-06-25 DIAGNOSIS — G4731 Primary central sleep apnea: Secondary | ICD-10-CM

## 2017-06-25 DIAGNOSIS — G4739 Other sleep apnea: Secondary | ICD-10-CM | POA: Insufficient documentation

## 2017-06-25 DIAGNOSIS — M5431 Sciatica, right side: Secondary | ICD-10-CM

## 2017-06-25 DIAGNOSIS — G4761 Periodic limb movement disorder: Secondary | ICD-10-CM

## 2017-06-25 MED ORDER — ROPINIROLE HCL 1 MG PO TABS: 2.0000 mg | ORAL_TABLET | Freq: Every day | ORAL | 3 refills | Status: DC

## 2017-06-25 NOTE — Patient Instructions (Signed)
GAIT , Posture and balance- flexibility .    Need PT to explain necessary steps, may continue with Physical Trainer.

## 2017-06-25 NOTE — Progress Notes (Signed)
[facility]  Provider:  Larey Seat, M D  Referring Provider: Leanna Battles, MD Primary Care Physician:  Leanna Battles, MD  Chief Complaint  Patient presents with  . Follow-up    HPI:  Charles [name] is a 83 y.o. , right handed , caucasian ,a  retired Arboriculturist . He was seen on 12-11-14 , after referral from Dr. Philip Aspen for a sleep apnea Re- evaluation;  Charles [name] recalls being tested for sleep apnea about 12 years ago. It was his primary care physician Dr. Philip Aspen who referred him for the sleep test at the time to [facility] Sleep lab, where received the diagnosis of a the sleep apnea and was prescribed a CPAP machine. Currently his durable medical equipment company is [employer]. He brought his machine here but unfortunately it does not allow to read the [employer] memory chip. He received a new machine just March 19th. 2015 by Dr. Philip Aspen as the prescribing physician.  He feels that his machine works fine for him but is unaware of the current settings. He wears a  Lexicographer Full face mask. He just got that new , too.  He was not required to undergo a new sleep test to qualify ? Medicare approval usually depends on a re evaluation.  Charles [name] a bed time between 10.30 to 11 PM and falls asleep promptly. He has no TV in the bedroom and watches in the den before going to bed. He sleeps often while the TV is runnig, his wife reports. His father was the same well. He will have 2 nocturia  breaks, since using CPAP 12 years ago. He has been using hydroxyzine as a sleep aid every night, 50 mg.  He wakes up feeling refreshed, after 7 hours of sleep. He rises at 6.30, uses an alarm . He drinks no caffeine.  No ETOH and no tobacco use reported.  He is falling asleep here reporting his medical history.... It appears , that Charles [name] has a resting tremor and los of facial mimic as well as hypophonia.  His wife reports that he has been yelling and  thrashing in his sleep, but falls back asleep within minutes, doesn't leave the bed.   January 8 th 2016 :Charles [name] was seen in every visit within 6 weeks after his initial consultation last year by my nurse practitioner Charlott Holler. He is here today for follow-up on CPAP compliance. Charles [name] now uses CPAP at 8 cm water with full-time EPR of 1 cm his average time of use for CPAP is 7 hours and 11 minutes and he has a 97% compliance for 30 out of 30 days and 20 a days of over 4 hours continued use.  His AHI was 10.8 which is rather high. It is related to any air leaks as I can see be obtained the tracing today here in office 12-11-14 and the patient has 3.7 obstructive residual apneas per hour of sleep. He has never used an hour total titrated before but his machine can be used as such. He would not need to obtain a different machine-  just a temporary change in settings.  I would like for him to use the CPAP between 6 at 10 cm water continue with heated humidity and with his current mask as his air leaks are low. DME is [employer] .  The patient endorsed fatigue severity score at 30 points and the Epworth sleepiness score at 8 points and the geriatric depression score at  4 points.    Interval history from 12/21/2015. Charles [name] is seen here today for compliance visit ; he is highly compliant with 100% over 30 days of daily hours of use. 7 hours of 25 minutes is his average daily user time, the machine is an AutoSet between 6 and 10 cm  H20, with 1 cm EPR, and  his residual AHI is 5.1 which is sufficient. He is highly compliant endorsed today the geriatric depression score at 0 points. The [facility] restless leg syndrome quality of life questionnaire was endorsed at a moderate impairment over the last 4 weeks he felt quite distressed at times by restless legs and it involved changing stool his evening routine, a few times it kept him from attending social events and he states that he is  concentrating in the evening which may be a side effect of restless legs. The RLS 6 rating scale was endorsed at 9 to very mild impairment.  Restless legs only bother him when he is trying to fall asleep or when he sits for longer times at rest.  The Epworth sleepiness score was endorsed at 4 points the fatigue severity score at 16 points. He is not in any way bothered by his mild tremor which is symmetrically appearing in both hands and is not very evident at rest but mostly with action. he does not have other Parkinson symptoms. He reports having been evaluated by orthopedist for right leg numbness, which seems to fit an L4 nerve distribution ( attenuation of the patella reflex).    I had the pleasure of seeing Charles [name] today on 06/25/2017. He is an established CPAP user but struggles with some higher than desired residual apneas. He has been highly compliant CPAP with an AutoSet between 5 and 15 cm water pressure , 1 cm EPR. He is using the machine on average 7 hours at night, his residual apnea index is 3.5 central apneas are 1.8, obstructive 1.7 per hour of sleep and he has also shallow breathing spells at 4.4 per hour. However only 5% of the night are in Cheyne-Stokes respirations.  He still struggles with some restless legs but he is not excessively daytime fatigued.  He endorsed the fatigue score a 28 points, the Epworth sleepiness score at 9 points. The geriatric depression score at 4 out of 15 points below a level that is suggestive of depression.  Review of Systems: Out of a complete 14 system review, the patient complains of only the following symptoms, and all other reviewed systems are negative. On CPAP, highly compliant, interogated the machine. 8 cm water. RLS causing insomnia.     Social History   Social History  . Marital status: Married    Spouse name: Charles [name]  . Number of children: 3  . Years of education: College   Occupational History  . Retired Retired    retired  Arboriculturist   Social History Main Topics  . Smoking status: Never Smoker  . Smokeless tobacco: Never Used  . Alcohol use No  . Drug use: No  . Sexual activity: No   Other Topics Concern  . Not on file   Social History Narrative   Patient is married Charles [name]).   Patient is a retired Arboriculturist.   Patient lives in [facility] facility.   Patient has three adult children.   Patient does not drink any caffeine.   Patient is right-handed.    Family History  Problem Relation Age of Onset  . Prostate  cancer Father   . CAD Mother   . Arthritis-Osteo Brother     Past Medical History:  Diagnosis Date  . Arthritis   . AVM (arteriovenous malformation)   . Barrett's esophagus   . BPH (benign prostatic hyperplasia)   . CAD (coronary artery disease)    only on stress test; never had MI, PCI or CABG.  In 2008, LVEF was reportedly normal.   . Cataract   . Constipation   . CPAP (continuous positive airway pressure) dependence   . Depression   . Diverticulosis   . Diverticulosis   . GERD (gastroesophageal reflux disease)   . History of radiation therapy   . Hyperlipidemia   . Hypoglycemia   . Lymphocytosis 06/2012  . Mastodynia   . Murmur   . Nasal congestion   . Osteoporosis   . Osteoporosis   . Prostate cancer ([place])   . Sleep apnea    wears CPAP  . Urinary retention with incomplete bladder emptying    pt uses bathroom and within 30 mins needs to go again  . Vitamin D deficiency     Past Surgical History:  Procedure Laterality Date  . BACK SURGERY  04/2013  . back surgury     f  . BLEPHAROPLASTY Bilateral 02/23/15  . CATARACT EXTRACTION    . COLONOSCOPY    . EYE SURGERY Bilateral   . FINGER SURGERY Right    right thumb  . HARDWARE REMOVAL Right 05/15/2016   Procedure: Right Lumbar Two-Lumbar Five Removal of Hardware;  Surgeon: Kristeen Miss, MD;  Location: [facility];  Service: Neurosurgery;  Laterality: Right;  Right L2-5 removal of hardware  . INGUINAL HERNIA  REPAIR     x4  . REPAIR SPIGELIAN HERNIA  2011  . SPINAL FUSION  1996  . TONSILLECTOMY    . UPPER GASTROINTESTINAL ENDOSCOPY    . VASECTOMY      Current Outpatient Prescriptions  Medication Sig Dispense Refill  . acetaminophen (TYLENOL) 500 MG tablet Take 1,000 mg by mouth every 6 (six) hours as needed for mild pain. TAKES WITH TRAMADOL    . aspirin EC 81 MG tablet Take 81 mg by mouth at bedtime.     Marland Kitchen buPROPion (WELLBUTRIN SR) 150 MG 12 hr tablet Take 150 mg by mouth 2 (two) times daily.     . cholecalciferol (VITAMIN D) 1000 UNITS tablet Take 1,000 Units by mouth daily.    . ferrous sulfate 325 (65 FE) MG EC tablet Take 325 mg by mouth daily with breakfast.    . fluticasone (FLONASE) 50 MCG/ACT nasal spray Place into both nostrils daily.    Marland Kitchen gabapentin (NEURONTIN) 300 MG capsule Take 1 tablet PO at 4 PM  and 1 tablet at 10 PM (Patient taking differently: Take 300 mg by mouth 3 (three) times daily. Take 1 tablet PO at 8am, 2 at 4 PM  and 2 tablet at 10 PM) 180 capsule 3  . Leuprolide Acetate, 6 Month, (LUPRON DEPOT, 23-MONTH, IM) Inject into the muscle. NEXT INJECTION IS DUE 02-2017.  GETS AT [facility]    . lovastatin (MEVACOR) 20 MG tablet Take 20 mg by mouth at bedtime.     . Magnesium 250 MG TABS Take 250 mg by mouth every evening.     . methocarbamol (ROBAXIN) 500 MG tablet Take 500 mg by mouth every 8 (eight) hours as needed for muscle spasms.    . Multiple Minerals-Vitamins (CITRACAL PLUS BONE DENSITY) TABS Take 1 tablet  by mouth 2 (two) times daily.     . Multiple Vitamins-Minerals (CENTRUM SILVER ADULT 50+) TABS Take 1 tablet by mouth daily with breakfast.    . mupirocin ointment (BACTROBAN) 2 % Apply 1 application topically daily as needed (WOUND CARE).     . nabumetone (RELAFEN) 500 MG tablet Take 500 mg by mouth 2 (two) times daily as needed for mild pain.     . Omega-3 Fatty Acids (FISH OIL ULTRA) 1400 MG CAPS Take 1,400 mg by mouth daily.    Marland Kitchen omeprazole (PRILOSEC) 40  MG capsule Take 1 capsule (40 mg total) by mouth 2 (two) times daily. 180 capsule 3  . oxymetazoline (AFRIN) 0.05 % nasal spray Place 1 spray into both nostrils 2 (two) times daily as needed for congestion.    . propranolol (INDERAL) 10 MG tablet Take 10 mg by mouth daily.     . ranitidine (ZANTAC) 300 MG tablet Take 1 tablet (300 mg total) by mouth at bedtime. 90 tablet 3  . rOPINIRole (REQUIP) 1 MG tablet Take 2 mg by mouth at bedtime.    . senna (SENOKOT) 8.6 MG TABS tablet Take 1 tablet by mouth daily as needed for mild constipation.    . Tamsulosin HCl (FLOMAX) 0.4 MG CAPS Take 0.4 mg by mouth 2 (two) times daily.     . traMADol (ULTRAM) 50 MG tablet Take 2 tablets (100 mg total) by mouth 2 (two) times daily as needed for moderate pain. 60 tablet 3  . triamcinolone cream (KENALOG) 0.1 % Apply 1 application topically daily as needed (RASH).    Marland Kitchen zoledronic acid (RECLAST) 5 MG/100ML SOLN injection Inject into the vein. NEXT IS DUE 03-2017     Current Facility-Administered Medications  Medication Dose Route Frequency Provider Last Rate Last Dose  . bupivacaine (MARCAINE) 0.5 % (with pres) injection 3 mL  3 mL Other Once Magnus Sinning, MD      . lidocaine (PF) (XYLOCAINE) 1 % injection 2 mL  2 mL Other Once Magnus Sinning, MD      . methylPREDNISolone acetate (DEPO-MEDROL) injection 80 mg  80 mg Other Once Magnus Sinning, MD        Allergies as of 06/25/2017 - Review Complete 06/25/2017  Allergen Reaction Noted  . Celebrex [celecoxib] Other (See Comments) 04/23/2012  . Cyclobenzaprine Other (See Comments) 09/07/2015    Vitals: BP 127/62   Pulse (!) 58   Ht 5\' 6"  (1.676 m)   Wt 183 lb (83 kg)   BMI 29.54 kg/m  Last Weight:  Wt Readings from Last 1 Encounters:  06/25/17 183 lb (83 kg)   Last Height:   Ht Readings from Last 1 Encounters:  06/25/17 5\' 6"  (1.676 m)    Physical exam:  General: The patient is awake, alert and appears not in acute distress. The patient is  well groomed. Head: Normocephalic, atraumatic. Neck is supple. Mallampati 2, neck circumference: 16 inches. He has a tongue tremor,  A droopy left mouth. Reduced facial expression.  Cardiovascular:  Regular rate and rhythm , without carotid bruit, and without distended neck veins. Mitral murmur?  Respiratory: Lungs are clear to auscultation. Skin:  Without evidence of edema, or rash Trunk: BMI is normal, as is  posture.  Neurologic exam : The patient is fatigued, drowsy. oriented to place and time.  Memory subjective  described as delayed. There is a normal attention span & concentration ability.  Speech is fluent without  dysarthria, dysphonia or aphasia. Mood and affect  are depressed. He is concerned, worried and worn.  Cranial nerves: Pupils are equal and briskly reactive to light.  Status post cataract surgery - dry eyes.  Extraocular movements  in vertical and horizontal planes intact and without nystagmus. Visual fields by finger perimetry are intact. Hearing to finger rub intact.  Facial sensation intact to fine touch. Facial motor strength - left sided droop,  tongue and uvula move midline. No fasciculations , not titubation.   Motor exam:  Cogwheeling mildly- over either biceps.   normal muscle bulk and symmetric normal strength in all extremities. Very  strong grip remains present. .   Sensory: entire leg feels numb. He reports pain in the hamstring and buttocks. Status post 2 spinal fusion ( Dr Ellene Route, Louanne Skye)   Fine touch, pinprick and vibration were tested in all extremities. Proprioception is normal. Gait and station: Patient walks without assistive device - Strength within normal limits. Stance is stable and normal.  Steps are unfragmented.  Deep tendon reflexes:  Status post lumbar fusion, times 2 , several levels  in the upper  extremities are symmetric and intact.   Assessment:  After physical and neurologic examination, review of laboratory studies, imaging, neurophysiology  testing and pre-existing records, assessment is :  Visit 30 minutes with download of machine ,tracing reviewed and mask fit demonstrated, depression and sleepiness cores obtained, 50% of the time spent with face to face conversation, information about the treatment and diagnosis , improving the AHI.   1) OSA confirmed and presenting with improvement on  Auto CPAP 5-10 cm water, 95% is 10 cm CPAP,  residual AHi of 5.1 .He is highly compliant with his CPAP, 100%  2) Sciatica ?  3) depression present , but not likely a major contributor to fatigue and sleepiness, as his GDS was 4 . The patient is in treatment for depression for the last 25 years, and has had multiple relapses. He is chronically dysthymic.    Number for restless legs the patient stated that this keeps him frequently from falling asleep. He is taking ropinirole 1 mg at night and twice a day gabapentin. I would like for him to take 2 doses of ropinirole 1 in the late afternoon before bedtime and 1 at that time this way rest his leg should not arise and bother him.  Plan:  Treatment plan and additional workup :   1) OSA not well controlled on auto-set in January 2017 , resiudal AHI is more central than obstructive. He has an residual AHI of  7.9/hr. pressure at the 95th percentile is 14.5 cm water, I would like to increase the pressure by 1 cm to a maximum of 16 cm water.   2) Sciatica and RLS) not followed here- pain related , spinal fusion, sciatica. Ordered NCV.  Had a nerve block with little success- Dr. Ernestina Patches, Dr.  Louanne Skye needs to follow with pain care . He prescribed meds for back pain , which helps with RLS. He may continue.      RV with NP after NCV,  Cecille Rubin NP.  Larey Seat, MD    Cc Dr Louanne Skye and Dr Philip Aspen.

## 2017-06-26 ENCOUNTER — Telehealth: Payer: Self-pay | Admitting: Neurology

## 2017-06-26 NOTE — Telephone Encounter (Signed)
Patient calling to advise he gets CPAP supplies from [employer].

## 2017-07-06 DIAGNOSIS — L97512 Non-pressure chronic ulcer of other part of right foot with fat layer exposed: Secondary | ICD-10-CM | POA: Diagnosis not present

## 2017-07-09 ENCOUNTER — Telehealth: Payer: Self-pay | Admitting: Neurology

## 2017-07-09 NOTE — Telephone Encounter (Signed)
Just send a note to EMG - we want both extremities. CD

## 2017-07-09 NOTE — Telephone Encounter (Signed)
Patient has NCV/EMG scheduled 07-11-17 because of problems with right leg but now the left leg has gotten worse. He would also like the left leg included in this test.

## 2017-07-10 NOTE — Telephone Encounter (Signed)
Send message to [employer] CD

## 2017-07-11 ENCOUNTER — Ambulatory Visit (INDEPENDENT_AMBULATORY_CARE_PROVIDER_SITE_OTHER): Payer: Medicare Other | Admitting: Neurology

## 2017-07-11 ENCOUNTER — Encounter: Payer: Self-pay | Admitting: Neurology

## 2017-07-11 ENCOUNTER — Ambulatory Visit (INDEPENDENT_AMBULATORY_CARE_PROVIDER_SITE_OTHER): Payer: Self-pay | Admitting: Neurology

## 2017-07-11 DIAGNOSIS — G2581 Restless legs syndrome: Secondary | ICD-10-CM

## 2017-07-11 DIAGNOSIS — G894 Chronic pain syndrome: Secondary | ICD-10-CM

## 2017-07-11 DIAGNOSIS — M5431 Sciatica, right side: Secondary | ICD-10-CM | POA: Diagnosis not present

## 2017-07-11 DIAGNOSIS — G4761 Periodic limb movement disorder: Secondary | ICD-10-CM

## 2017-07-11 NOTE — Progress Notes (Signed)
Please refer to EMG and nerve conduction study procedure note. 

## 2017-07-11 NOTE — Procedures (Signed)
     HISTORY:  Charles [name] is an 83 year old gentleman with a history of some numbness involving the right leg. The patient has some discomfort in the back and hip associated with a pulling discomfort. He feels better when he is walking, worse when he is sitting. He is being evaluated for this discomfort. The patient has had 2 prior lumbosacral spine surgeries.  NERVE CONDUCTION STUDIES:  Nerve conduction studies were performed on both lower extremities. The distal motor latency for the left peroneal nerve was prolonged, normal on the right. The motor amplitudes for these nerves were low bilaterally with normal nerve conduction velocities seen for these nerves bilaterally. The distal motor latencies for the posterior tibial nerves were normal bilaterally with low motor amplitude seen for these nerves, and slowing was seen for the posterior tibial nerves bilaterally. The peroneal sensory latencies were unobtainable bilaterally. The H reflex latencies were normal bilaterally.  EMG STUDIES:  EMG study was performed on the right lower extremity:  The tibialis anterior muscle reveals 1 to 3K motor units with markedly reduced recruitment. 1+ positive waves were seen. The peroneus tertius muscle reveals 2 to 5K motor units with moderately decreased recruitment. No fibrillations or positive waves were seen. The medial gastrocnemius muscle reveals 1 to 4K motor units with decreased recruitment. No fibrillations or positive waves were seen. The vastus lateralis muscle reveals 2 to 5K motor units with decreased recruitment. No fibrillations or positive waves were seen. The iliopsoas muscle reveals 2 to 5K motor units with decreased recruitment. No fibrillations or positive waves were seen. The biceps femoris muscle (long head) reveals 2 to 4K motor units with full recruitment. No fibrillations or positive waves were seen. The lumbosacral paraspinal muscles were tested at 3 levels, and revealed no  abnormalities of insertional activity at all 3 levels tested. There was good relaxation.   IMPRESSION:  Nerve conduction studies done on both lower extremities shows evidence of a primarily axonal peripheral neuropathy of moderate severity. EMG evaluation of the right lower extremity shows primarily chronic stable denervation distally consistent with a peripheral neuropathy, there is some evidence of overlying chronic L4 or L5 radiculopathy as well.  Jill Alexanders MD 07/11/2017 6:43 PM  [facility] [address] [place],  [address]  Phone [phone] Fax [phone]

## 2017-07-13 DIAGNOSIS — H1133 Conjunctival hemorrhage, bilateral: Secondary | ICD-10-CM | POA: Diagnosis not present

## 2017-07-16 ENCOUNTER — Telehealth: Payer: Self-pay | Admitting: Neurology

## 2017-07-16 NOTE — Telephone Encounter (Signed)
-----   Message from Larey Seat, MD sent at 07/15/2017 10:21 AM EDT ----- Primary axonal neuropathy, moderate degree-  with the right leg having additional chronic L4-5 radiculopathic findings. CD

## 2017-07-16 NOTE — Telephone Encounter (Signed)
Called to discuss the pt's EMG result. No answer at this time. LVM for pt to call back.

## 2017-07-17 ENCOUNTER — Telehealth: Payer: Self-pay | Admitting: Neurology

## 2017-07-17 NOTE — Telephone Encounter (Signed)
-----   Message from Larey Seat, MD sent at 07/15/2017 10:21 AM EDT ----- Primary axonal neuropathy, moderate degree-  with the right leg having additional chronic L4-5 radiculopathic findings. CD

## 2017-07-17 NOTE — Telephone Encounter (Signed)
Pt returned call and I was able to discuss his EMG results. Pt verbalized understanding. I informed him to continue with Gabapentin at the time and when he follows up in October with the NP he can address how he feels the medication is working. Pt had no further questions at this time. Pt did as for a copy of the report and to send a copy to his primary doc Dr Philip Aspen. I assured him I would.

## 2017-07-19 NOTE — Progress Notes (Signed)
     [place]    Nerve / Sites Muscle Latency Ref. Amplitude Ref. Rel Amp Segments Distance Velocity Ref. Area    ms ms mV mV %  cm m/s m/s mVms  L Peroneal - EDB     Ankle EDB 7.2 ?6.5 0.8 ?2.0 100 Ankle - EDB 9   3.8     Fib head EDB 15.7  0.5  59.2 Fib head - Ankle 39 46 ?44 1.2     Pop fossa EDB 17.6  0.5  92.9 Pop fossa - Fib head 10 53 ?44 1.2         Pop fossa - Ankle      R Peroneal - EDB     Ankle EDB 4.4 ?6.5 0.3 ?2.0 100 Ankle - EDB 9   0.8     Fib head EDB 11.7  0.5  142 Fib head - Ankle 39 54 ?44 1.3     Pop fossa EDB 13.4  0.4  76.8 Pop fossa - Fib head 10 56 ?44 1.0         Pop fossa - Ankle      L Tibial - AH     Ankle AH 4.6 ?5.8 0.6 ?4.0 100 Ankle - AH 9   1.5     Pop fossa AH 15.4  0.4  71.2 Pop fossa - Ankle 43 40 ?41 1.3  R Tibial - AH     Ankle AH 4.7 ?5.8 1.1 ?4.0 100 Ankle - AH 9   3.4     Pop fossa AH 15.7  0.5  47.1 Pop fossa - Ankle 44 40 ?41 0.8             SNC    Nerve / Sites Rec. Site Peak Lat Ref.  Amp Ref. Segments Distance    ms ms V V  cm  R Superficial peroneal - Ankle     Lat leg Ankle NR ?4.4 NR ?6 Lat leg - Ankle 14  L Superficial peroneal - Ankle     Lat leg Ankle NR ?4.4 NR ?6 Lat leg - Ankle 14         H Reflex    Nerve H Lat Lat Hmax   ms ms   Left Right Ref. Left Right Ref.  Tibial - [place] 30.6 31.0 ?35.0 21.0 36.1 ?35.0

## 2017-07-26 DIAGNOSIS — L97512 Non-pressure chronic ulcer of other part of right foot with fat layer exposed: Secondary | ICD-10-CM | POA: Diagnosis not present

## 2017-07-31 DIAGNOSIS — E559 Vitamin D deficiency, unspecified: Secondary | ICD-10-CM | POA: Diagnosis not present

## 2017-07-31 DIAGNOSIS — R7309 Other abnormal glucose: Secondary | ICD-10-CM | POA: Diagnosis not present

## 2017-07-31 DIAGNOSIS — Z125 Encounter for screening for malignant neoplasm of prostate: Secondary | ICD-10-CM | POA: Diagnosis not present

## 2017-07-31 DIAGNOSIS — E784 Other hyperlipidemia: Secondary | ICD-10-CM | POA: Diagnosis not present

## 2017-07-31 DIAGNOSIS — R791 Abnormal coagulation profile: Secondary | ICD-10-CM | POA: Diagnosis not present

## 2017-08-07 DIAGNOSIS — R600 Localized edema: Secondary | ICD-10-CM | POA: Diagnosis not present

## 2017-08-07 DIAGNOSIS — G2581 Restless legs syndrome: Secondary | ICD-10-CM | POA: Diagnosis not present

## 2017-08-07 DIAGNOSIS — Z0000 Encounter for general adult medical examination without abnormal findings: Secondary | ICD-10-CM | POA: Diagnosis not present

## 2017-08-07 DIAGNOSIS — C61 Malignant neoplasm of prostate: Secondary | ICD-10-CM | POA: Diagnosis not present

## 2017-08-07 DIAGNOSIS — M545 Low back pain: Secondary | ICD-10-CM | POA: Diagnosis not present

## 2017-08-07 DIAGNOSIS — D6489 Other specified anemias: Secondary | ICD-10-CM | POA: Diagnosis not present

## 2017-08-07 DIAGNOSIS — Z23 Encounter for immunization: Secondary | ICD-10-CM | POA: Diagnosis not present

## 2017-08-07 DIAGNOSIS — E784 Other hyperlipidemia: Secondary | ICD-10-CM | POA: Diagnosis not present

## 2017-08-07 DIAGNOSIS — M79671 Pain in right foot: Secondary | ICD-10-CM | POA: Diagnosis not present

## 2017-08-07 DIAGNOSIS — M810 Age-related osteoporosis without current pathological fracture: Secondary | ICD-10-CM | POA: Diagnosis not present

## 2017-08-07 DIAGNOSIS — Z6829 Body mass index (BMI) 29.0-29.9, adult: Secondary | ICD-10-CM | POA: Diagnosis not present

## 2017-08-07 DIAGNOSIS — F3289 Other specified depressive episodes: Secondary | ICD-10-CM | POA: Diagnosis not present

## 2017-08-07 DIAGNOSIS — I2510 Atherosclerotic heart disease of native coronary artery without angina pectoris: Secondary | ICD-10-CM | POA: Diagnosis not present

## 2017-08-08 DIAGNOSIS — Z1212 Encounter for screening for malignant neoplasm of rectum: Secondary | ICD-10-CM | POA: Diagnosis not present

## 2017-08-09 ENCOUNTER — Other Ambulatory Visit (HOSPITAL_COMMUNITY): Payer: Self-pay | Admitting: Internal Medicine

## 2017-08-09 ENCOUNTER — Ambulatory Visit (HOSPITAL_COMMUNITY)
Admission: RE | Admit: 2017-08-09 | Discharge: 2017-08-09 | Disposition: A | Discharge status: Home / Self Care | Payer: Medicare Other | Source: Ambulatory Visit | Attending: Vascular Surgery | Admitting: Vascular Surgery

## 2017-08-09 DIAGNOSIS — R600 Localized edema: Secondary | ICD-10-CM | POA: Diagnosis not present

## 2017-08-15 DIAGNOSIS — M2041 Other hammer toe(s) (acquired), right foot: Secondary | ICD-10-CM | POA: Diagnosis not present

## 2017-08-15 DIAGNOSIS — L97511 Non-pressure chronic ulcer of other part of right foot limited to breakdown of skin: Secondary | ICD-10-CM | POA: Diagnosis not present

## 2017-08-20 DIAGNOSIS — C61 Malignant neoplasm of prostate: Secondary | ICD-10-CM | POA: Diagnosis not present

## 2017-08-23 DIAGNOSIS — L97511 Non-pressure chronic ulcer of other part of right foot limited to breakdown of skin: Secondary | ICD-10-CM | POA: Diagnosis not present

## 2017-08-29 DIAGNOSIS — M868X8 Other osteomyelitis, other site: Secondary | ICD-10-CM | POA: Diagnosis not present

## 2017-08-31 ENCOUNTER — Other Ambulatory Visit: Payer: Self-pay | Admitting: Orthopedic Surgery

## 2017-09-12 ENCOUNTER — Encounter (HOSPITAL_BASED_OUTPATIENT_CLINIC_OR_DEPARTMENT_OTHER): Payer: Self-pay | Admitting: *Deleted

## 2017-09-18 ENCOUNTER — Encounter: Payer: Self-pay | Admitting: Neurology

## 2017-09-19 ENCOUNTER — Ambulatory Visit (INDEPENDENT_AMBULATORY_CARE_PROVIDER_SITE_OTHER): Payer: Medicare Other | Admitting: Adult Health

## 2017-09-19 ENCOUNTER — Encounter: Payer: Self-pay | Admitting: Adult Health

## 2017-09-19 VITALS — BP 133/77 | HR 62 | Ht 66.0 in | Wt 185.0 lb

## 2017-09-19 DIAGNOSIS — G4733 Obstructive sleep apnea (adult) (pediatric): Secondary | ICD-10-CM | POA: Diagnosis not present

## 2017-09-19 DIAGNOSIS — M5441 Lumbago with sciatica, right side: Secondary | ICD-10-CM | POA: Diagnosis not present

## 2017-09-19 DIAGNOSIS — M5442 Lumbago with sciatica, left side: Secondary | ICD-10-CM

## 2017-09-19 DIAGNOSIS — G8929 Other chronic pain: Secondary | ICD-10-CM | POA: Diagnosis not present

## 2017-09-19 DIAGNOSIS — Z9989 Dependence on other enabling machines and devices: Secondary | ICD-10-CM | POA: Diagnosis not present

## 2017-09-19 DIAGNOSIS — G6289 Other specified polyneuropathies: Secondary | ICD-10-CM

## 2017-09-19 DIAGNOSIS — G608 Other hereditary and idiopathic neuropathies: Secondary | ICD-10-CM

## 2017-09-19 NOTE — Progress Notes (Signed)
PATIENT: Charles [name] DOB: [birth year]  REASON FOR VISIT: follow up HISTORY FROM: patient  HISTORY OF PRESENT ILLNESS: Today 09/19/17  Charles [name] is an 83 year old male with a history of obstructive sleep apnea, restless leg syndrome and chronic back pain. He returns today for follow-up. The patient had EMG studies completed at the last visit that did confirm an axonal peripheral neuropathy and chronic radiculopathy at L4-L5. The patient states that he has been seen by Dr. Louanne Skye as well as Dr. Ellene Route for back pain. At this time they've not been able to offer him any additional interventions. He reports that he continues to have significant back pain typically radiates down the legs. He does report numbness in the feet. He states that he can only walk short distances before he feels that his stamina is gone.  At the last visit with Dr. Brett Fairy increased his pressure  16 however looking in the download this was not done. His download today shows that he uses machine 30 out of 30 days for compliance of 100%. He uses machine greater than 4 hours 29 out of 30 days for compliance of 97%. On average he uses machine 7 hours and 29 minutes. His residual AHI is 11.3 on a minimum pressure of 6 cm water with maximum pressure of 15 cm water. He returns today for an evaluation.    REVIEW OF SYSTEMS: Out of a complete 14 system review of symptoms, the patient complains only of the following symptoms, and all other reviewed systems are negative.  See history of present illness  ALLERGIES: Allergies  Allergen Reactions  . Celebrex [Celecoxib] Other (See Comments)    Created stomach ulcers.  . Cyclobenzaprine Other (See Comments)    Caught in hiatal hernia and caused extreme pain    HOME MEDICATIONS: Outpatient Medications Prior to Visit  Medication Sig Dispense Refill  . acetaminophen (TYLENOL) 500 MG tablet Take 1,000 mg by mouth every 6 (six) hours as needed for mild pain. TAKES WITH  TRAMADOL    . aspirin EC 81 MG tablet Take 81 mg by mouth at bedtime.     Marland Kitchen buPROPion (WELLBUTRIN SR) 150 MG 12 hr tablet Take 150 mg by mouth 2 (two) times daily.     . cholecalciferol (VITAMIN D) 1000 UNITS tablet Take 1,000 Units by mouth daily.    . ferrous sulfate 325 (65 FE) MG EC tablet Take 325 mg by mouth daily with breakfast.    . gabapentin (NEURONTIN) 300 MG capsule Take 1 tablet PO at 4 PM  and 1 tablet at 10 PM (Patient taking differently: Take 300 mg by mouth 3 (three) times daily. Take 1 tablet PO at 8am, 2 at 4 PM  and 2 tablet at 10 PM) 180 capsule 3  . ipratropium (ATROVENT) 0.03 % nasal spray Place 2 sprays into both nostrils every 12 (twelve) hours.    . Leuprolide Acetate, 6 Month, (LUPRON DEPOT, 45-MONTH, IM) Inject into the muscle. NEXT INJECTION IS DUE 02-2017.  GETS AT [facility]    . lovastatin (MEVACOR) 20 MG tablet Take 20 mg by mouth at bedtime.     . Magnesium 250 MG TABS Take 250 mg by mouth every evening.     . methocarbamol (ROBAXIN) 500 MG tablet Take 500 mg by mouth every 8 (eight) hours as needed for muscle spasms.    . Multiple Minerals-Vitamins (CITRACAL PLUS BONE DENSITY) TABS Take 1 tablet by mouth 2 (two) times daily.     Marland Kitchen  Multiple Vitamins-Minerals (CENTRUM SILVER ADULT 50+) TABS Take 1 tablet by mouth daily with breakfast.    . nabumetone (RELAFEN) 500 MG tablet Take 500 mg by mouth 2 (two) times daily as needed for mild pain.     . Omega-3 Fatty Acids (FISH OIL ULTRA) 1400 MG CAPS Take 1,400 mg by mouth daily.    Marland Kitchen omeprazole (PRILOSEC) 40 MG capsule Take 1 capsule (40 mg total) by mouth 2 (two) times daily. 180 capsule 3  . propranolol (INDERAL) 10 MG tablet Take 10 mg by mouth daily.     . ranitidine (ZANTAC) 300 MG tablet Take 1 tablet (300 mg total) by mouth at bedtime. 90 tablet 3  . rOPINIRole (REQUIP) 1 MG tablet Take 2 tablets (2 mg total) by mouth at bedtime. 60 tablet 3  . senna (SENOKOT) 8.6 MG TABS tablet Take 1 tablet by mouth daily  as needed for mild constipation.    . Tamsulosin HCl (FLOMAX) 0.4 MG CAPS Take 0.4 mg by mouth 2 (two) times daily.     . traMADol (ULTRAM) 50 MG tablet Take 2 tablets (100 mg total) by mouth 2 (two) times daily as needed for moderate pain. 60 tablet 3  . triamcinolone cream (KENALOG) 0.1 % Apply 1 application topically daily as needed (RASH).    Marland Kitchen zoledronic acid (RECLAST) 5 MG/100ML SOLN injection Inject into the vein. NEXT IS DUE 03-2017     Facility-Administered Medications Prior to Visit  Medication Dose Route Frequency Provider Last Rate Last Dose  . bupivacaine (MARCAINE) 0.5 % (with pres) injection 3 mL  3 mL Other Once Magnus Sinning, MD      . methylPREDNISolone acetate (DEPO-MEDROL) injection 80 mg  80 mg Other Once Magnus Sinning, MD        PAST MEDICAL HISTORY: Past Medical History:  Diagnosis Date  . Arthritis   . AVM (arteriovenous malformation)   . Barrett's esophagus   . BPH (benign prostatic hyperplasia)   . CAD (coronary artery disease)    only on stress test; never had MI, PCI or CABG.  In 2008, LVEF was reportedly normal.   . Cataract   . Constipation   . CPAP (continuous positive airway pressure) dependence   . Depression   . Diverticulosis   . Diverticulosis   . GERD (gastroesophageal reflux disease)   . History of radiation therapy   . Hyperlipidemia   . Hypoglycemia   . Lymphocytosis 06/2012  . Mastodynia   . Murmur   . Nasal congestion   . Osteoporosis   . Osteoporosis   . Prostate cancer ([place])   . Sleep apnea    wears CPAP  . Urinary retention with incomplete bladder emptying    pt uses bathroom and within 30 mins needs to go again  . Vitamin D deficiency     PAST SURGICAL HISTORY: Past Surgical History:  Procedure Laterality Date  . BACK SURGERY  04/2013  . back surgury     f  . BLEPHAROPLASTY Bilateral 02/23/15  . CATARACT EXTRACTION    . COLONOSCOPY    . EYE SURGERY Bilateral   . FINGER SURGERY Right    right thumb  . HARDWARE  REMOVAL Right 05/15/2016   Procedure: Right Lumbar Two-Lumbar Five Removal of Hardware;  Surgeon: Kristeen Miss, MD;  Location: [facility];  Service: Neurosurgery;  Laterality: Right;  Right L2-5 removal of hardware  . INGUINAL HERNIA REPAIR     x4  . REPAIR SPIGELIAN HERNIA  2011  .  SPINAL FUSION  1996  . TONSILLECTOMY    . UPPER GASTROINTESTINAL ENDOSCOPY    . VASECTOMY      FAMILY HISTORY: Family History  Problem Relation Age of Onset  . Prostate cancer Father   . CAD Mother   . Arthritis-Osteo Brother     SOCIAL HISTORY: Social History   Social History  . Marital status: Married    Spouse name: [name]  . Number of children: 3  . Years of education: College   Occupational History  . Retired Retired    retired Arboriculturist   Social History Main Topics  . Smoking status: Never Smoker  . Smokeless tobacco: Never Used  . Alcohol use No  . Drug use: No  . Sexual activity: No   Other Topics Concern  . Not on file   Social History Narrative   Patient is married [name]).   Patient is a retired Arboriculturist.   Patient lives in [place] living facility.   Patient has three adult children.   Patient does not drink any caffeine.   Patient is right-handed.      PHYSICAL EXAM  Vitals:   09/19/17 1338  BP: 133/77  Pulse: 62  Weight: 185 lb (83.9 kg)  Height: 5\' 6"  (1.676 m)   Body mass index is 29.86 kg/m.  Generalized: Well developed, in no acute distress   Neurological examination  Mentation: Alert oriented to time, place, history taking. Follows all commands speech and language fluent Cranial nerve II-XII: Pupils were equal round reactive to light. Extraocular movements were full, visual field were full on confrontational test. Facial sensation and strength were normal. Uvula tongue midline. Head turning and shoulder shrug  were normal and symmetric. Motor: The motor testing reveals 5 over 5 strength of all 4 extremities. Good symmetric motor tone is noted  throughout.  Sensory: Sensory testing is intact to soft touch on all 4 extremities. No evidence of extinction is noted.  Coordination: Cerebellar testing reveals good finger-nose-finger and heel-to-shin bilaterally.  Gait and station: Patient has a slightly stooped posture. He uses a walker when ambulating. Tandem gait not attempted. Reflexes: Deep tendon reflexes are symmetric and normal bilaterally.   DIAGNOSTIC DATA (LABS, IMAGING, TESTING) - I reviewed patient records, labs, notes, testing and imaging myself where available.  Lab Results  Component Value Date   WBC 5.8 05/08/2016   HGB 14.2 05/08/2016   HCT 42.9 05/08/2016   MCV 91.1 05/08/2016   PLT 143 (L) 05/08/2016      Component Value Date/Time   NA 139 05/08/2016 0938   K 4.3 05/08/2016 0938   CL 106 05/08/2016 0938   CO2 28 05/08/2016 0938   GLUCOSE 89 05/08/2016 0938   BUN 15 05/08/2016 0938   CREATININE 0.93 05/08/2016 0938   CALCIUM 9.1 05/08/2016 0938   PROT 5.2 (L) 04/12/2013 0540   ALBUMIN 2.2 (L) 04/12/2013 0540   AST 113 (H) 04/12/2013 0540   ALT 40 04/12/2013 0540   ALKPHOS 51 04/12/2013 0540   BILITOT 0.5 04/12/2013 0540   GFRNONAA >60 05/08/2016 0938   GFRAA >60 05/08/2016 0938     ASSESSMENT AND PLAN 83 y.o. year old male  has a past medical history of Arthritis; AVM (arteriovenous malformation); Barrett's esophagus; BPH (benign prostatic hyperplasia); CAD (coronary artery disease); Cataract; Constipation; CPAP (continuous positive airway pressure) dependence; Depression; Diverticulosis; Diverticulosis; GERD (gastroesophageal reflux disease); History of radiation therapy; Hyperlipidemia; Hypoglycemia; Lymphocytosis (06/2012); Mastodynia; Murmur; Nasal congestion; Osteoporosis; Osteoporosis; Prostate cancer ([place]); Sleep apnea; Urinary  retention with incomplete bladder emptying; and Vitamin D deficiency. here with:  1. Obstructive sleep apnea on CPAP 2. Peripheral neuropathy 3. Chronic back pain  The  patient CPAP download indicates that he still has a high residual apnea. We will change his pressure from 6-20 cm water. If he is unable to tolerate this he will let us know. Patient is encouraged to follow-up with his primary care or Dr. Clarice Pole office for pain management. He will remain on gabapentin at this time. He is advised that if his symptoms worsen or he develops new symptoms he should let us know. He will follow-up in 6 months or sooner if needed.    Ward Givens, MSN, NP-C 09/19/2017, 1:41 PM [facility] [address], [place], [place] [address] [phone]

## 2017-09-19 NOTE — Patient Instructions (Signed)
Your Plan:  Continue using CPAP nightly follow up with PCP or Dr. Ellene Route for pain management  If your symptoms worsen or you develop new symptoms please let us know.   Thank you for coming to see [place] at [facility]. I hope we have been able to provide you high quality care today.  You may receive a patient satisfaction survey over the next few weeks. We would appreciate your feedback and comments so that we may continue to improve ourselves and the health of our patients.

## 2017-09-20 ENCOUNTER — Ambulatory Visit (HOSPITAL_BASED_OUTPATIENT_CLINIC_OR_DEPARTMENT_OTHER): Payer: Medicare Other | Admitting: Anesthesiology

## 2017-09-20 ENCOUNTER — Ambulatory Visit (HOSPITAL_BASED_OUTPATIENT_CLINIC_OR_DEPARTMENT_OTHER)
Admission: RE | Admit: 2017-09-20 | Discharge: 2017-09-20 | Disposition: A | Discharge status: Home / Self Care | Payer: Medicare Other | Source: Ambulatory Visit | Attending: Orthopedic Surgery | Admitting: Orthopedic Surgery

## 2017-09-20 ENCOUNTER — Encounter (HOSPITAL_BASED_OUTPATIENT_CLINIC_OR_DEPARTMENT_OTHER): Payer: Self-pay

## 2017-09-20 ENCOUNTER — Encounter (HOSPITAL_BASED_OUTPATIENT_CLINIC_OR_DEPARTMENT_OTHER)
Admission: RE | Disposition: A | Discharge status: Home / Self Care | Payer: Self-pay | Source: Ambulatory Visit | Attending: Orthopedic Surgery

## 2017-09-20 DIAGNOSIS — Z8249 Family history of ischemic heart disease and other diseases of the circulatory system: Secondary | ICD-10-CM | POA: Insufficient documentation

## 2017-09-20 DIAGNOSIS — Z981 Arthrodesis status: Secondary | ICD-10-CM | POA: Diagnosis not present

## 2017-09-20 DIAGNOSIS — M1990 Unspecified osteoarthritis, unspecified site: Secondary | ICD-10-CM | POA: Diagnosis not present

## 2017-09-20 DIAGNOSIS — G4730 Sleep apnea, unspecified: Secondary | ICD-10-CM | POA: Diagnosis not present

## 2017-09-20 DIAGNOSIS — R011 Cardiac murmur, unspecified: Secondary | ICD-10-CM | POA: Insufficient documentation

## 2017-09-20 DIAGNOSIS — F329 Major depressive disorder, single episode, unspecified: Secondary | ICD-10-CM | POA: Insufficient documentation

## 2017-09-20 DIAGNOSIS — K219 Gastro-esophageal reflux disease without esophagitis: Secondary | ICD-10-CM | POA: Diagnosis not present

## 2017-09-20 DIAGNOSIS — M810 Age-related osteoporosis without current pathological fracture: Secondary | ICD-10-CM | POA: Diagnosis not present

## 2017-09-20 DIAGNOSIS — Z923 Personal history of irradiation: Secondary | ICD-10-CM | POA: Insufficient documentation

## 2017-09-20 DIAGNOSIS — R3914 Feeling of incomplete bladder emptying: Secondary | ICD-10-CM | POA: Diagnosis not present

## 2017-09-20 DIAGNOSIS — R338 Other retention of urine: Secondary | ICD-10-CM | POA: Diagnosis not present

## 2017-09-20 DIAGNOSIS — E785 Hyperlipidemia, unspecified: Secondary | ICD-10-CM | POA: Insufficient documentation

## 2017-09-20 DIAGNOSIS — K5790 Diverticulosis of intestine, part unspecified, without perforation or abscess without bleeding: Secondary | ICD-10-CM | POA: Insufficient documentation

## 2017-09-20 DIAGNOSIS — Z7982 Long term (current) use of aspirin: Secondary | ICD-10-CM | POA: Insufficient documentation

## 2017-09-20 DIAGNOSIS — E1169 Type 2 diabetes mellitus with other specified complication: Secondary | ICD-10-CM

## 2017-09-20 DIAGNOSIS — M869 Osteomyelitis, unspecified: Secondary | ICD-10-CM | POA: Insufficient documentation

## 2017-09-20 DIAGNOSIS — G4733 Obstructive sleep apnea (adult) (pediatric): Secondary | ICD-10-CM | POA: Diagnosis not present

## 2017-09-20 DIAGNOSIS — I2510 Atherosclerotic heart disease of native coronary artery without angina pectoris: Secondary | ICD-10-CM | POA: Diagnosis not present

## 2017-09-20 DIAGNOSIS — K5900 Constipation, unspecified: Secondary | ICD-10-CM | POA: Insufficient documentation

## 2017-09-20 DIAGNOSIS — I10 Essential (primary) hypertension: Secondary | ICD-10-CM | POA: Diagnosis not present

## 2017-09-20 DIAGNOSIS — L97516 Non-pressure chronic ulcer of other part of right foot with bone involvement without evidence of necrosis: Secondary | ICD-10-CM | POA: Insufficient documentation

## 2017-09-20 DIAGNOSIS — K2270 Barrett's esophagus without dysplasia: Secondary | ICD-10-CM | POA: Insufficient documentation

## 2017-09-20 DIAGNOSIS — E559 Vitamin D deficiency, unspecified: Secondary | ICD-10-CM | POA: Diagnosis not present

## 2017-09-20 DIAGNOSIS — J9600 Acute respiratory failure, unspecified whether with hypoxia or hypercapnia: Secondary | ICD-10-CM | POA: Diagnosis not present

## 2017-09-20 DIAGNOSIS — N401 Enlarged prostate with lower urinary tract symptoms: Secondary | ICD-10-CM | POA: Diagnosis not present

## 2017-09-20 DIAGNOSIS — L97519 Non-pressure chronic ulcer of other part of right foot with unspecified severity: Secondary | ICD-10-CM | POA: Diagnosis present

## 2017-09-20 DIAGNOSIS — Z8042 Family history of malignant neoplasm of prostate: Secondary | ICD-10-CM | POA: Insufficient documentation

## 2017-09-20 DIAGNOSIS — Z9849 Cataract extraction status, unspecified eye: Secondary | ICD-10-CM | POA: Insufficient documentation

## 2017-09-20 DIAGNOSIS — Z8546 Personal history of malignant neoplasm of prostate: Secondary | ICD-10-CM | POA: Diagnosis not present

## 2017-09-20 DIAGNOSIS — Z8261 Family history of arthritis: Secondary | ICD-10-CM | POA: Diagnosis not present

## 2017-09-20 DIAGNOSIS — Z79899 Other long term (current) drug therapy: Secondary | ICD-10-CM | POA: Insufficient documentation

## 2017-09-20 DIAGNOSIS — M86671 Other chronic osteomyelitis, right ankle and foot: Secondary | ICD-10-CM | POA: Diagnosis not present

## 2017-09-20 DIAGNOSIS — Z888 Allergy status to other drugs, medicaments and biological substances status: Secondary | ICD-10-CM | POA: Insufficient documentation

## 2017-09-20 HISTORY — PX: AMPUTATION TOE: SHX6595

## 2017-09-20 SURGERY — AMPUTATION, TOE
Anesthesia: Monitor Anesthesia Care | Site: Toe | Laterality: Right

## 2017-09-20 MED ORDER — ONDANSETRON HCL 4 MG/2ML IJ SOLN
INTRAMUSCULAR | Status: DC | PRN
  Administered 2017-09-20: 4 mg via INTRAVENOUS

## 2017-09-20 MED ORDER — LACTATED RINGERS IV SOLN
INTRAVENOUS | Status: DC
  Administered 2017-09-20 (×2): via INTRAVENOUS

## 2017-09-20 MED ORDER — SUCCINYLCHOLINE CHLORIDE 200 MG/10ML IV SOSY
PREFILLED_SYRINGE | INTRAVENOUS | Status: AC
  Filled 2017-09-20: qty 10

## 2017-09-20 MED ORDER — CHLORHEXIDINE GLUCONATE 4 % EX LIQD: 60.0000 mL | Freq: Once | CUTANEOUS | Status: DC

## 2017-09-20 MED ORDER — PROPOFOL 10 MG/ML IV BOLUS
INTRAVENOUS | Status: DC | PRN
  Administered 2017-09-20 (×2): 20 mg via INTRAVENOUS

## 2017-09-20 MED ORDER — FENTANYL CITRATE (PF) 100 MCG/2ML IJ SOLN: 25.0000 ug | INTRAMUSCULAR | Status: DC | PRN

## 2017-09-20 MED ORDER — CEFAZOLIN SODIUM-DEXTROSE 2-4 GM/100ML-% IV SOLN
INTRAVENOUS | Status: AC
  Filled 2017-09-20: qty 100

## 2017-09-20 MED ORDER — LIDOCAINE-EPINEPHRINE 2 %-1:100000 IJ SOLN
INTRAMUSCULAR | Status: DC | PRN
  Administered 2017-09-20: 15 mL via INTRADERMAL

## 2017-09-20 MED ORDER — MIDAZOLAM HCL 5 MG/5ML IJ SOLN
INTRAMUSCULAR | Status: DC | PRN
  Administered 2017-09-20 (×2): 1 mg via INTRAVENOUS

## 2017-09-20 MED ORDER — PHENYLEPHRINE 40 MCG/ML (10ML) SYRINGE FOR IV PUSH (FOR BLOOD PRESSURE SUPPORT)
PREFILLED_SYRINGE | INTRAVENOUS | Status: AC
  Filled 2017-09-20: qty 10

## 2017-09-20 MED ORDER — LIDOCAINE-EPINEPHRINE 2 %-1:100000 IJ SOLN
INTRAMUSCULAR | Status: AC
  Filled 2017-09-20: qty 1

## 2017-09-20 MED ORDER — EPHEDRINE 5 MG/ML INJ
INTRAVENOUS | Status: AC
  Filled 2017-09-20: qty 10

## 2017-09-20 MED ORDER — FENTANYL CITRATE (PF) 100 MCG/2ML IJ SOLN
INTRAMUSCULAR | Status: DC | PRN
  Administered 2017-09-20: 25 ug via INTRAVENOUS

## 2017-09-20 MED ORDER — SODIUM CHLORIDE 0.9 % IV SOLN: INTRAVENOUS | Status: DC

## 2017-09-20 MED ORDER — ONDANSETRON HCL 4 MG/2ML IJ SOLN: 4.0000 mg | Freq: Once | INTRAMUSCULAR | Status: DC | PRN

## 2017-09-20 MED ORDER — DEXAMETHASONE SODIUM PHOSPHATE 10 MG/ML IJ SOLN
INTRAMUSCULAR | Status: AC
  Filled 2017-09-20: qty 1

## 2017-09-20 MED ORDER — FENTANYL CITRATE (PF) 100 MCG/2ML IJ SOLN: 50.0000 ug | INTRAMUSCULAR | Status: DC | PRN

## 2017-09-20 MED ORDER — FENTANYL CITRATE (PF) 100 MCG/2ML IJ SOLN
INTRAMUSCULAR | Status: AC
  Filled 2017-09-20: qty 2

## 2017-09-20 MED ORDER — PROPOFOL 500 MG/50ML IV EMUL
INTRAVENOUS | Status: DC | PRN
  Administered 2017-09-20: 50 ug/kg/min via INTRAVENOUS

## 2017-09-20 MED ORDER — BUPIVACAINE-EPINEPHRINE (PF) 0.5% -1:200000 IJ SOLN
INTRAMUSCULAR | Status: AC
  Filled 2017-09-20: qty 30

## 2017-09-20 MED ORDER — SCOPOLAMINE 1 MG/3DAYS TD PT72: 1.0000 | MEDICATED_PATCH | Freq: Once | TRANSDERMAL | Status: DC | PRN

## 2017-09-20 MED ORDER — LIDOCAINE 2% (20 MG/ML) 5 ML SYRINGE
INTRAMUSCULAR | Status: AC
  Filled 2017-09-20: qty 5

## 2017-09-20 MED ORDER — MIDAZOLAM HCL 2 MG/2ML IJ SOLN: 1.0000 mg | INTRAMUSCULAR | Status: DC | PRN

## 2017-09-20 MED ORDER — DEXAMETHASONE SODIUM PHOSPHATE 10 MG/ML IJ SOLN
INTRAMUSCULAR | Status: DC | PRN
  Administered 2017-09-20: 10 mg via INTRAVENOUS

## 2017-09-20 MED ORDER — CEFAZOLIN SODIUM-DEXTROSE 2-4 GM/100ML-% IV SOLN
2.0000 g | INTRAVENOUS | Status: AC
  Administered 2017-09-20: 2 g via INTRAVENOUS

## 2017-09-20 MED ORDER — ONDANSETRON HCL 4 MG/2ML IJ SOLN
INTRAMUSCULAR | Status: AC
  Filled 2017-09-20: qty 2

## 2017-09-20 SURGICAL SUPPLY — 63 items
BLADE AVERAGE 25MMX9MM (BLADE)
BLADE AVERAGE 25X9 (BLADE) IMPLANT
BLADE OSC/SAG .038X5.5 CUT EDG (BLADE) IMPLANT
BLADE SURG 10 STRL SS (BLADE) IMPLANT
BLADE SURG 15 STRL LF DISP TIS (BLADE) ×1 IMPLANT
BLADE SURG 15 STRL SS (BLADE) ×3
BNDG CMPR 9X4 STRL LF SNTH (GAUZE/BANDAGES/DRESSINGS) ×1
BNDG COHESIVE 4X5 TAN STRL (GAUZE/BANDAGES/DRESSINGS) ×3 IMPLANT
BNDG CONFORM 3 STRL LF (GAUZE/BANDAGES/DRESSINGS) ×2 IMPLANT
BNDG ESMARK 4X9 LF (GAUZE/BANDAGES/DRESSINGS) ×3 IMPLANT
CHLORAPREP W/TINT 26ML (MISCELLANEOUS) ×3 IMPLANT
COVER BACK TABLE 60X90IN (DRAPES) ×3 IMPLANT
DECANTER SPIKE VIAL GLASS SM (MISCELLANEOUS) IMPLANT
DRAPE EXTREMITY T 121X128X90 (DRAPE) ×3 IMPLANT
DRAPE SURG 17X23 STRL (DRAPES) ×1 IMPLANT
DRAPE U-SHAPE 47X51 STRL (DRAPES) ×3 IMPLANT
DRSG EMULSION OIL 3X3 NADH (GAUZE/BANDAGES/DRESSINGS) IMPLANT
DRSG MEPITEL 4X7.2 (GAUZE/BANDAGES/DRESSINGS) ×3 IMPLANT
DRSG PAD ABDOMINAL 8X10 ST (GAUZE/BANDAGES/DRESSINGS) IMPLANT
ELECT REM PT RETURN 9FT ADLT (ELECTROSURGICAL) ×3
ELECTRODE REM PT RTRN 9FT ADLT (ELECTROSURGICAL) ×1 IMPLANT
GAUZE SPONGE 4X4 12PLY STRL (GAUZE/BANDAGES/DRESSINGS) ×3 IMPLANT
GLOVE BIO SURGEON STRL SZ 6.5 (GLOVE) ×1 IMPLANT
GLOVE BIO SURGEON STRL SZ8 (GLOVE) ×3 IMPLANT
GLOVE BIO SURGEONS STRL SZ 6.5 (GLOVE) ×1
GLOVE BIOGEL PI IND STRL 8 (GLOVE) ×2 IMPLANT
GLOVE BIOGEL PI INDICATOR 8 (GLOVE) ×2
GLOVE ECLIPSE 8.0 STRL XLNG CF (GLOVE) ×3 IMPLANT
GLOVE EXAM NITRILE MD LF STRL (GLOVE) ×2 IMPLANT
GOWN STRL REUS W/ TWL LRG LVL3 (GOWN DISPOSABLE) ×1 IMPLANT
GOWN STRL REUS W/ TWL XL LVL3 (GOWN DISPOSABLE) ×2 IMPLANT
GOWN STRL REUS W/TWL LRG LVL3 (GOWN DISPOSABLE) ×3
GOWN STRL REUS W/TWL XL LVL3 (GOWN DISPOSABLE) ×3
NDL HYPO 25X1 1.5 SAFETY (NEEDLE) IMPLANT
NDL SAFETY ECLIPSE 18X1.5 (NEEDLE) IMPLANT
NEEDLE HYPO 18GX1.5 SHARP (NEEDLE)
NEEDLE HYPO 25X1 1.5 SAFETY (NEEDLE) ×3 IMPLANT
NS IRRIG 1000ML POUR BTL (IV SOLUTION) ×3 IMPLANT
PACK BASIN DAY SURGERY FS (CUSTOM PROCEDURE TRAY) ×3 IMPLANT
PAD CAST 4YDX4 CTTN HI CHSV (CAST SUPPLIES) ×1 IMPLANT
PADDING CAST ABS 4INX4YD NS (CAST SUPPLIES)
PADDING CAST ABS COTTON 4X4 ST (CAST SUPPLIES) IMPLANT
PADDING CAST COTTON 4X4 STRL (CAST SUPPLIES) ×3
PENCIL BUTTON HOLSTER BLD 10FT (ELECTRODE) ×3 IMPLANT
SANITIZER HAND PURELL 535ML FO (MISCELLANEOUS) ×3 IMPLANT
SHEET MEDIUM DRAPE 40X70 STRL (DRAPES) ×3 IMPLANT
SLEEVE SCD COMPRESS KNEE MED (MISCELLANEOUS) ×3 IMPLANT
SPONGE LAP 18X18 X RAY DECT (DISPOSABLE) ×3 IMPLANT
STOCKINETTE 6  STRL (DRAPES) ×2
STOCKINETTE 6 STRL (DRAPES) ×1 IMPLANT
SUCTION FRAZIER HANDLE 10FR (MISCELLANEOUS) ×2
SUCTION TUBE FRAZIER 10FR DISP (MISCELLANEOUS) IMPLANT
SUT ETHILON 2 0 FS 18 (SUTURE) ×2 IMPLANT
SUT ETHILON 2 0 FSLX (SUTURE) IMPLANT
SUT ETHILON 3 0 PS 1 (SUTURE) ×1 IMPLANT
SUT MNCRL AB 3-0 PS2 18 (SUTURE) IMPLANT
SWAB COLLECTION DEVICE MRSA (MISCELLANEOUS) IMPLANT
SYR BULB 3OZ (MISCELLANEOUS) ×3 IMPLANT
SYR CONTROL 10ML LL (SYRINGE) ×2 IMPLANT
TOWEL OR 17X24 6PK STRL BLUE (TOWEL DISPOSABLE) ×3 IMPLANT
TUBE CONNECTING 20'X1/4 (TUBING) ×1
TUBE CONNECTING 20X1/4 (TUBING) ×1 IMPLANT
UNDERPAD 30X30 (UNDERPADS AND DIAPERS) ×3 IMPLANT

## 2017-09-20 NOTE — Op Note (Signed)
09/20/2017  2:29 PM  PATIENT:  Charles [name]  83 y.o. 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  INDICATION FOR PROCEDURE: The patient is an 83 year old male with a chronic fourth toe ulcer.  He has developed osteomyelitis of the distal phalanx.  He presents now for amputation of his fourth toe.  He and his wife understand the risks and benefits of the alternative treatment options and elects surgical treatment.  He specifically understands risks of bleeding, nerve damage, blood clots, revision amputation and death.  At  PROCEDURE IN DETAIL:  After pre operative consent was obtained, and the correct operative site was identified, the patient was brought to the operating room and placed supine on the OR table.  Anesthesia was administered.  Pre-operative antibiotics were administered.  A surgical timeout was taken.  The right lower extremity was then prepped and draped in standard sterile fashion.  A metatarsal block was performed with half percent Marcaine with epinephrine at the fourth toe.  The foot was then exsanguinated and a 4 inch Esmarch tourniquet wrapped around the ankle.  A fishmouth incision was marked on the skin adjacent to the PIP joint.  Incision was made and dissection was carried down through the skin and subtendinous tissues.  Subperiosteal dissection was carried along the proximal phalanx.  A bone cutter was used to transect the proximal phalanx just proximal from the head.  The toe was then passed off the field.  The wound was irrigated copiously.  The neurovascular bundles were cauterized.  The wound was closed with  3-0 nylon.  Sterile dressings were applied followed by a compression wrap.  Tourniquet was released after education of the dressings at approximately 10 minutes.  The patient was awakened from anesthesia and transported to the recovery room in stable condition.  FOLLOW UP PLAN: Weightbearing as tolerated in a flat postop shoe.  Return to clinic in 2 weeks for suture removal and conversion to regular shoes.

## 2017-09-20 NOTE — Transfer of Care (Signed)
Immediate Anesthesia Transfer of Care Note  Patient: Charles [name]  Procedure(s) Performed: Right 4th toe amputation (Right Toe)  Patient Location: PACU  Anesthesia Type:MAC  Level of Consciousness: awake, alert  and oriented  Airway & Oxygen Therapy: Patient Spontanous Breathing and Patient connected to face mask oxygen  Post-op Assessment: Report given to RN and Post -op Vital signs reviewed and stable  Post vital signs: Reviewed and stable  Last Vitals:  Vitals:   09/20/17 1147  BP: (!) 158/54  Pulse: (!) 55  Resp: 18  Temp: 36.5 C  SpO2: 99%    Last Pain:  Vitals:   09/20/17 1147  TempSrc: Oral         Complications: No apparent anesthesia complications

## 2017-09-20 NOTE — Discharge Instructions (Addendum)
Wylene Simmer, MD [place]  Please read the following information regarding your care after surgery.  Medications  You only need a prescription for the narcotic pain medicine (ex. oxycodone, Percocet, Norco).  All of the other medicines listed below are available over the counter. X Aleve 2 pills twice a day for the first 3 days after surgery. X acetominophen (Tylenol) 650 mg every 4-6 hours as you need for minor to moderate pain  Weight Bearing ? Bear weight when you are able on your operated leg or foot. X Bear weight only on your operated foot in the post-op shoe. ? Do not bear any weight on the operated leg or foot.  Cast / Splint / Dressing X Keep your splint, cast or dressing clean and dry.  Dont put anything (coat hanger, pencil, etc) down inside of it.  If it gets damp, use a hair dryer on the cool setting to dry it.  If it gets soaked, call the office to schedule an appointment for a cast change. ? Remove your dressing 3 days after surgery and cover the incisions with dry dressings.    After your dressing, cast or splint is removed; you may shower, but do not soak or scrub the wound.  Allow the water to run over it, and then gently pat it dry.  Swelling It is normal for you to have swelling where you had surgery.  To reduce swelling and pain, keep your toes above your nose for at least 3 days after surgery.  It may be necessary to keep your foot or leg elevated for several weeks.  If it hurts, it should be elevated.  Follow Up Call my office at [phone] when you are discharged from the hospital or surgery center to schedule an appointment to be seen two weeks after surgery.  Call my office at [phone] if you develop a fever >101.5 F, nausea, vomiting, bleeding from the surgical site or severe pain.     Post Anesthesia Home Care Instructions  Activity: Get plenty of rest for the remainder of the day. A responsible individual must stay with you for 24  hours following the procedure.  For the next 24 hours, DO NOT: -Drive a car -Paediatric nurse -Drink alcoholic beverages -Take any medication unless instructed by your physician -Make any legal decisions or sign important papers.  Meals: Start with liquid foods such as gelatin or soup. Progress to regular foods as tolerated. Avoid greasy, spicy, heavy foods. If nausea and/or vomiting occur, drink only clear liquids until the nausea and/or vomiting subsides. Call your physician if vomiting continues.  Special Instructions/Symptoms: Your throat may feel dry or sore from the anesthesia or the breathing tube placed in your throat during surgery. If this causes discomfort, gargle with warm salt water. The discomfort should disappear within 24 hours.  If you had a scopolamine patch placed behind your ear for the management of post- operative nausea and/or vomiting:  1. The medication in the patch is effective for 72 hours, after which it should be removed.  Wrap patch in a tissue and discard in the trash. Wash hands thoroughly with soap and water. 2. You may remove the patch earlier than 72 hours if you experience unpleasant side effects which may include dry mouth, dizziness or visual disturbances. 3. Avoid touching the patch. Wash your hands with soap and water after contact with the patch.

## 2017-09-20 NOTE — Anesthesia Preprocedure Evaluation (Addendum)
Anesthesia Evaluation  Patient identified by MRN, date of birth, ID band Patient awake    Reviewed: Allergy & Precautions, NPO status , Patient's Chart, lab work & pertinent test results  Airway Mallampati: II  TM Distance: >3 FB Neck ROM: Full    Dental  (+) Teeth Intact, Dental Advisory Given   Pulmonary    breath sounds clear to auscultation       Cardiovascular  Rhythm:Regular Rate:Normal     Neuro/Psych    GI/Hepatic   Endo/Other    Renal/GU      Musculoskeletal   Abdominal   Peds  Hematology   Anesthesia Other Findings   Reproductive/Obstetrics                             Anesthesia Physical Anesthesia Plan  ASA: III  Anesthesia Plan: MAC   Post-op Pain Management:  Regional for Post-op pain   Induction: Intravenous  PONV Risk Score and Plan: 1 and Ondansetron and Dexamethasone  Airway Management Planned: Natural Airway and Simple Face Mask  Additional Equipment:   Intra-op Plan:   Post-operative Plan:   Informed Consent: I have reviewed the patients History and Physical, chart, labs and discussed the procedure including the risks, benefits and alternatives for the proposed anesthesia with the patient or authorized representative who has indicated his/her understanding and acceptance.   Dental advisory given  Plan Discussed with: CRNA and Anesthesiologist  Anesthesia Plan Comments:         Anesthesia Quick Evaluation

## 2017-09-20 NOTE — H&P (Signed)
Charles [name] is an 83 y.o. male.   Chief Complaint:  Right foot ulcer HPI:  83 y/o male with a chronic ulcer of the 4th toe.  He has developed osteomyelitis of the distal phalanx.  He has failed non op treatment and present today for surgery.  Past Medical History:  Diagnosis Date  . Arthritis   . AVM (arteriovenous malformation)   . Barrett's esophagus   . BPH (benign prostatic hyperplasia)   . CAD (coronary artery disease)    only on stress test; never had MI, PCI or CABG.  In 2008, LVEF was reportedly normal.   . Cataract   . Constipation   . CPAP (continuous positive airway pressure) dependence   . Depression   . Diverticulosis   . Diverticulosis   . GERD (gastroesophageal reflux disease)   . History of radiation therapy   . Hyperlipidemia   . Hypoglycemia   . Lymphocytosis 06/2012  . Mastodynia   . Murmur   . Nasal congestion   . Osteoporosis   . Osteoporosis   . Prostate cancer ([place])   . Sleep apnea    wears CPAP  . Urinary retention with incomplete bladder emptying    pt uses bathroom and within 30 mins needs to go again  . Vitamin D deficiency     Past Surgical History:  Procedure Laterality Date  . BACK SURGERY  04/2013  . back surgury     f  . BLEPHAROPLASTY Bilateral 02/23/15  . CATARACT EXTRACTION    . COLONOSCOPY    . EYE SURGERY Bilateral   . FINGER SURGERY Right    right thumb  . HARDWARE REMOVAL Right 05/15/2016   Procedure: Right Lumbar Two-Lumbar Five Removal of Hardware;  Surgeon: Kristeen Miss, MD;  Location: [facility];  Service: Neurosurgery;  Laterality: Right;  Right L2-5 removal of hardware  . INGUINAL HERNIA REPAIR     x4  . REPAIR SPIGELIAN HERNIA  2011  . SPINAL FUSION  1996  . TONSILLECTOMY    . UPPER GASTROINTESTINAL ENDOSCOPY    . VASECTOMY      Family History  Problem Relation Age of Onset  . Prostate cancer Father   . CAD Mother   . Arthritis-Osteo Brother    Social History:  reports that he has never smoked. He has  never used smokeless tobacco. He reports that he does not drink alcohol or use drugs.  Allergies:  Allergies  Allergen Reactions  . Celebrex [Celecoxib] Other (See Comments)    Created stomach ulcers.  . Cyclobenzaprine Other (See Comments)    Caught in hiatal hernia and caused extreme pain    Facility-Administered Medications Prior to Admission  Medication Dose Route Frequency Provider Last Rate Last Dose  . bupivacaine (MARCAINE) 0.5 % (with pres) injection 3 mL  3 mL Other Once Magnus Sinning, MD      . methylPREDNISolone acetate (DEPO-MEDROL) injection 80 mg  80 mg Other Once Magnus Sinning, MD       Medications Prior to Admission  Medication Sig Dispense Refill  . acetaminophen (TYLENOL) 500 MG tablet Take 1,000 mg by mouth every 6 (six) hours as needed for mild pain. TAKES WITH TRAMADOL    . aspirin EC 81 MG tablet Take 81 mg by mouth at bedtime.     Marland Kitchen buPROPion (WELLBUTRIN SR) 150 MG 12 hr tablet Take 150 mg by mouth 2 (two) times daily.     . cholecalciferol (VITAMIN D) 1000 UNITS tablet Take 1,000  Units by mouth daily.    . ferrous sulfate 325 (65 FE) MG EC tablet Take 325 mg by mouth daily with breakfast.    . gabapentin (NEURONTIN) 300 MG capsule Take 1 tablet PO at 4 PM  and 1 tablet at 10 PM (Patient taking differently: Take 300 mg by mouth 3 (three) times daily. Take 1 tablet PO at 8am, 2 at 4 PM  and 2 tablet at 10 PM) 180 capsule 3  . ipratropium (ATROVENT) 0.03 % nasal spray Place 2 sprays into both nostrils every 12 (twelve) hours.    . lovastatin (MEVACOR) 20 MG tablet Take 20 mg by mouth at bedtime.     . Magnesium 250 MG TABS Take 250 mg by mouth every evening.     . methocarbamol (ROBAXIN) 500 MG tablet Take 500 mg by mouth every 8 (eight) hours as needed for muscle spasms.    . Multiple Minerals-Vitamins (CITRACAL PLUS BONE DENSITY) TABS Take 1 tablet by mouth 2 (two) times daily.     . Multiple Vitamins-Minerals (CENTRUM SILVER ADULT 50+) TABS Take 1 tablet by  mouth daily with breakfast.    . nabumetone (RELAFEN) 500 MG tablet Take 500 mg by mouth 2 (two) times daily as needed for mild pain.     . Omega-3 Fatty Acids (FISH OIL ULTRA) 1400 MG CAPS Take 1,400 mg by mouth daily.    Marland Kitchen omeprazole (PRILOSEC) 40 MG capsule Take 1 capsule (40 mg total) by mouth 2 (two) times daily. 180 capsule 3  . propranolol (INDERAL) 10 MG tablet Take 10 mg by mouth daily.     . ranitidine (ZANTAC) 300 MG tablet Take 1 tablet (300 mg total) by mouth at bedtime. 90 tablet 3  . rOPINIRole (REQUIP) 1 MG tablet Take 2 tablets (2 mg total) by mouth at bedtime. 60 tablet 3  . senna (SENOKOT) 8.6 MG TABS tablet Take 1 tablet by mouth daily as needed for mild constipation.    . Tamsulosin HCl (FLOMAX) 0.4 MG CAPS Take 0.4 mg by mouth 2 (two) times daily.     . traMADol (ULTRAM) 50 MG tablet Take 2 tablets (100 mg total) by mouth 2 (two) times daily as needed for moderate pain. 60 tablet 3  . triamcinolone cream (KENALOG) 0.1 % Apply 1 application topically daily as needed (RASH).    Marland Kitchen zoledronic acid (RECLAST) 5 MG/100ML SOLN injection Inject into the vein. NEXT IS DUE 03-2017    . Leuprolide Acetate, 6 Month, (LUPRON DEPOT, 78-MONTH, IM) Inject into the muscle. NEXT INJECTION IS DUE 02-2017.  GETS AT [facility]      No results found for this or any previous visit (from the past 48 hour(s)). No results found.  ROS   No recent f/c/n/v/wt loss Blood pressure (!) 158/54, pulse (!) 55, temperature 97.7 F (36.5 C), temperature source Oral, resp. rate 18, height 5\' 6"  (1.676 m), weight 82.6 kg (182 lb), SpO2 99 %. Physical Exam  wn wd male in nad.  A and o x 4.  Mood and affect normal.  EOMi.  resp unlabored.  R foot with thick ulcer at the tip of the 4th toe.  Chronic swelling of the distal half of the toe.  No lymphadenopathy.  5/5 strength in PF and DF of the ankle and toes.  Assessment/Plan R 4th toe chronic ulcer and osteomyelitis - to OR for right 4th toe amputation.  The  risks and benefits of the alternative treatment options have been discussed in  detail.  The patient wishes to proceed with surgery and specifically understands risks of bleeding, infection, nerve damage, blood clots, need for additional surgery, amputation and death.   Wylene Simmer, MD 09-26-17, 12:37 PM

## 2017-09-20 NOTE — Progress Notes (Signed)
I agree with the assessment and plan as directed by NP .The patient is known to me .   Davone Shinault, MD  

## 2017-09-21 ENCOUNTER — Encounter (HOSPITAL_BASED_OUTPATIENT_CLINIC_OR_DEPARTMENT_OTHER): Payer: Self-pay | Admitting: Orthopedic Surgery

## 2017-09-21 ENCOUNTER — Telehealth: Payer: Self-pay

## 2017-09-21 NOTE — Telephone Encounter (Signed)
Patient was in to see MM recently and there was discussion about his pressure needing to be increased. He says that based on his machine he believes this was done, but wanted someone to verify this information.

## 2017-09-21 NOTE — Anesthesia Postprocedure Evaluation (Signed)
Anesthesia Post Note  Patient: Charles [name]  Procedure(s) Performed: Right 4th toe amputation (Right Toe)     Patient location during evaluation: PACU Anesthesia Type: MAC Level of consciousness: awake, awake and alert and oriented Pain management: pain level controlled Vital Signs Assessment: post-procedure vital signs reviewed and stable Respiratory status: spontaneous breathing, nonlabored ventilation and respiratory function stable Cardiovascular status: blood pressure returned to baseline Anesthetic complications: no    Last Vitals:  Vitals:   09/20/17 1500 09/20/17 1530  BP: (!) 142/72 (!) 148/80  Pulse: 73 71  Resp: (!) 21 18  Temp:  36.5 C  SpO2: 97% 100%    Last Pain:  Vitals:   09/20/17 1433  TempSrc:   PainSc: 0-No pain                 Carol Loftin COKER

## 2017-09-24 ENCOUNTER — Encounter: Payer: Medicare Other | Admitting: Surgery

## 2017-09-24 ENCOUNTER — Other Ambulatory Visit: Payer: Self-pay | Admitting: Neurology

## 2017-09-24 ENCOUNTER — Encounter (HOSPITAL_COMMUNITY): Payer: Medicare Other

## 2017-09-24 DIAGNOSIS — G4733 Obstructive sleep apnea (adult) (pediatric): Secondary | ICD-10-CM

## 2017-09-24 NOTE — Telephone Encounter (Signed)
Called the patient back. Pt uses a company called [employer] for supplies. The patient also thought they dealt with his CPAP pressure changes. I had sent orders back in July for him and the patient's machine was never addressed. When he had his apt with Ward Givens they discussed pressure changes. Upon looking at the patient's old [place] he used [employer] in the past. I will forward the order to [facility] in hopes they can help with this adjustment change in his CPAP.

## 2017-09-26 NOTE — Telephone Encounter (Signed)
Received a fax stating that the patient is unable to get services done thru them. Called patient to make him aware and see if we can get him set up with someone who will accept his insurance. LVM for patient to call back

## 2017-09-26 NOTE — Telephone Encounter (Signed)
Pt called back today. He is wanting to speak with Myriam Jacobson. Thank you

## 2017-09-26 NOTE — Telephone Encounter (Signed)
Called the patient back and he was just making me aware that [facility] had called him and stated that the prescription that I sent was needing to be signed. I informed him that it was signed that they didn't see it but that I had circled and signed and refaxed it yesterday around 11:46 am. Pt stated that they did call back but he missed that call and they left a voicemail stating that they would call back within 5 days. I encouraged the patient to call them back and he stated that he would. Pt was appreciative for the help and verbalized understanding

## 2017-09-27 ENCOUNTER — Encounter: Payer: Self-pay | Admitting: Neurology

## 2017-09-27 NOTE — Telephone Encounter (Signed)
Charles [name] from [facility] was calling you back about records you were needing from about 2003, and she states that their records do not go back that far.

## 2017-09-29 ENCOUNTER — Inpatient Hospital Stay (HOSPITAL_COMMUNITY)
Admission: EM | Admit: 2017-09-29 | Discharge: 2017-10-02 | DRG: 378 | Disposition: A | Discharge status: Home / Self Care | Payer: Medicare Other | Attending: Family Medicine | Admitting: Family Medicine

## 2017-09-29 ENCOUNTER — Encounter (HOSPITAL_COMMUNITY): Payer: Self-pay | Admitting: Emergency Medicine

## 2017-09-29 DIAGNOSIS — G2581 Restless legs syndrome: Secondary | ICD-10-CM | POA: Diagnosis present

## 2017-09-29 DIAGNOSIS — K5520 Angiodysplasia of colon without hemorrhage: Secondary | ICD-10-CM | POA: Diagnosis not present

## 2017-09-29 DIAGNOSIS — K922 Gastrointestinal hemorrhage, unspecified: Secondary | ICD-10-CM | POA: Diagnosis present

## 2017-09-29 DIAGNOSIS — Z7982 Long term (current) use of aspirin: Secondary | ICD-10-CM

## 2017-09-29 DIAGNOSIS — F329 Major depressive disorder, single episode, unspecified: Secondary | ICD-10-CM | POA: Diagnosis present

## 2017-09-29 DIAGNOSIS — K449 Diaphragmatic hernia without obstruction or gangrene: Secondary | ICD-10-CM | POA: Diagnosis not present

## 2017-09-29 DIAGNOSIS — K5731 Diverticulosis of large intestine without perforation or abscess with bleeding: Secondary | ICD-10-CM | POA: Diagnosis present

## 2017-09-29 DIAGNOSIS — K2270 Barrett's esophagus without dysplasia: Secondary | ICD-10-CM | POA: Diagnosis not present

## 2017-09-29 DIAGNOSIS — Z8546 Personal history of malignant neoplasm of prostate: Secondary | ICD-10-CM | POA: Diagnosis not present

## 2017-09-29 DIAGNOSIS — R339 Retention of urine, unspecified: Secondary | ICD-10-CM | POA: Diagnosis present

## 2017-09-29 DIAGNOSIS — K219 Gastro-esophageal reflux disease without esophagitis: Secondary | ICD-10-CM | POA: Diagnosis present

## 2017-09-29 DIAGNOSIS — I10 Essential (primary) hypertension: Secondary | ICD-10-CM | POA: Diagnosis present

## 2017-09-29 DIAGNOSIS — Z538 Procedure and treatment not carried out for other reasons: Secondary | ICD-10-CM | POA: Diagnosis not present

## 2017-09-29 DIAGNOSIS — E785 Hyperlipidemia, unspecified: Secondary | ICD-10-CM | POA: Diagnosis present

## 2017-09-29 DIAGNOSIS — I2510 Atherosclerotic heart disease of native coronary artery without angina pectoris: Secondary | ICD-10-CM | POA: Diagnosis present

## 2017-09-29 DIAGNOSIS — Z981 Arthrodesis status: Secondary | ICD-10-CM

## 2017-09-29 DIAGNOSIS — D62 Acute posthemorrhagic anemia: Secondary | ICD-10-CM | POA: Diagnosis present

## 2017-09-29 DIAGNOSIS — D509 Iron deficiency anemia, unspecified: Secondary | ICD-10-CM | POA: Diagnosis present

## 2017-09-29 DIAGNOSIS — G4730 Sleep apnea, unspecified: Secondary | ICD-10-CM | POA: Diagnosis present

## 2017-09-29 DIAGNOSIS — Q399 Congenital malformation of esophagus, unspecified: Secondary | ICD-10-CM

## 2017-09-29 DIAGNOSIS — Z89421 Acquired absence of other right toe(s): Secondary | ICD-10-CM

## 2017-09-29 DIAGNOSIS — Z79899 Other long term (current) drug therapy: Secondary | ICD-10-CM

## 2017-09-29 DIAGNOSIS — D649 Anemia, unspecified: Secondary | ICD-10-CM | POA: Diagnosis not present

## 2017-09-29 DIAGNOSIS — M810 Age-related osteoporosis without current pathological fracture: Secondary | ICD-10-CM | POA: Diagnosis present

## 2017-09-29 DIAGNOSIS — R159 Full incontinence of feces: Secondary | ICD-10-CM | POA: Diagnosis present

## 2017-09-29 DIAGNOSIS — N400 Enlarged prostate without lower urinary tract symptoms: Secondary | ICD-10-CM | POA: Diagnosis present

## 2017-09-29 DIAGNOSIS — M549 Dorsalgia, unspecified: Secondary | ICD-10-CM | POA: Diagnosis present

## 2017-09-29 DIAGNOSIS — Z888 Allergy status to other drugs, medicaments and biological substances status: Secondary | ICD-10-CM | POA: Diagnosis not present

## 2017-09-29 DIAGNOSIS — K625 Hemorrhage of anus and rectum: Secondary | ICD-10-CM | POA: Diagnosis not present

## 2017-09-29 DIAGNOSIS — K921 Melena: Secondary | ICD-10-CM | POA: Diagnosis not present

## 2017-09-29 DIAGNOSIS — K5730 Diverticulosis of large intestine without perforation or abscess without bleeding: Secondary | ICD-10-CM | POA: Diagnosis not present

## 2017-09-29 LAB — COMPREHENSIVE METABOLIC PANEL
ALK PHOS: 76 U/L (ref 38–126)
ALT: 23 U/L (ref 17–63)
ANION GAP: 8 (ref 5–15)
AST: 53 U/L — ABNORMAL HIGH (ref 15–41)
Albumin: 3.8 g/dL (ref 3.5–5.0)
BILIRUBIN TOTAL: 0.4 mg/dL (ref 0.3–1.2)
BUN: 25 mg/dL — ABNORMAL HIGH (ref 6–20)
CALCIUM: 9.1 mg/dL (ref 8.9–10.3)
CO2: 23 mmol/L (ref 22–32)
CREATININE: 0.99 mg/dL (ref 0.61–1.24)
Chloride: 108 mmol/L (ref 101–111)
GFR calc non Af Amer: >60 mL/min (ref >60)
GLUCOSE: 117 mg/dL — AB (ref 65–99)
Potassium: 4 mmol/L (ref 3.5–5.1)
Sodium: 139 mmol/L (ref 135–145)
TOTAL PROTEIN: 6.8 g/dL (ref 6.5–8.1)

## 2017-09-29 LAB — CBC
HCT: 35 % — ABNORMAL LOW (ref 39.0–52.0)
HEMATOCRIT: 34.6 % — AB (ref 39.0–52.0)
HEMOGLOBIN: 11.6 g/dL — AB (ref 13.0–17.0)
HEMOGLOBIN: 11.4 g/dL — AB (ref 13.0–17.0)
MCH: 30.1 pg (ref 26.0–34.0)
MCH: 29.6 pg (ref 26.0–34.0)
MCHC: 33.1 g/dL (ref 30.0–36.0)
MCHC: 32.9 g/dL (ref 30.0–36.0)
MCV: 90.7 fL (ref 78.0–100.0)
MCV: 89.9 fL (ref 78.0–100.0)
PLATELETS: 177 10*3/uL (ref 150–400)
PLATELETS: 166 10*3/uL (ref 150–400)
RBC: 3.86 MIL/uL — AB (ref 4.22–5.81)
RBC: 3.85 MIL/uL — AB (ref 4.22–5.81)
RDW: 15.1 % (ref 11.5–15.5)
RDW: 15.2 % (ref 11.5–15.5)
WBC: 5.4 10*3/uL (ref 4.0–10.5)
WBC: 4.4 10*3/uL (ref 4.0–10.5)

## 2017-09-29 LAB — SAMPLE TO BLOOD BANK

## 2017-09-29 LAB — PROTIME-INR
INR: 1.07
PROTHROMBIN TIME: 13.8 s (ref 11.4–15.2)

## 2017-09-29 MED ORDER — ALBUTEROL SULFATE (2.5 MG/3ML) 0.083% IN NEBU: 2.5000 mg | INHALATION_SOLUTION | Freq: Four times a day (QID) | RESPIRATORY_TRACT | Status: DC | PRN

## 2017-09-29 MED ORDER — ADULT MULTIVITAMIN W/MINERALS CH
1.0000 | ORAL_TABLET | Freq: Every day | ORAL | Status: DC
  Administered 2017-10-02: 1 via ORAL
  Filled 2017-09-29: qty 1

## 2017-09-29 MED ORDER — CITRACAL PLUS BONE DENSITY PO TABS: 1.0000 | ORAL_TABLET | Freq: Two times a day (BID) | ORAL | Status: DC

## 2017-09-29 MED ORDER — PRAVASTATIN SODIUM 20 MG PO TABS
20.0000 mg | ORAL_TABLET | Freq: Every day | ORAL | Status: DC
  Administered 2017-09-29 – 2017-10-01 (×3): 20 mg via ORAL
  Filled 2017-09-29 (×3): qty 1

## 2017-09-29 MED ORDER — FERROUS SULFATE 325 (65 FE) MG PO TABS: 325.0000 mg | ORAL_TABLET | Freq: Every day | ORAL | Status: DC

## 2017-09-29 MED ORDER — PANTOPRAZOLE SODIUM 40 MG IV SOLR
40.0000 mg | Freq: Two times a day (BID) | INTRAVENOUS | Status: DC
  Administered 2017-09-29 – 2017-10-02 (×6): 40 mg via INTRAVENOUS
  Filled 2017-09-29 (×6): qty 40

## 2017-09-29 MED ORDER — PROPRANOLOL HCL 20 MG PO TABS
10.0000 mg | ORAL_TABLET | Freq: Every day | ORAL | Status: DC
  Administered 2017-09-30 – 2017-10-02 (×3): 10 mg via ORAL
  Filled 2017-09-29: qty 0.5
  Filled 2017-09-29: qty 1
  Filled 2017-09-29: qty 0.5

## 2017-09-29 MED ORDER — MAGNESIUM OXIDE 400 (241.3 MG) MG PO TABS
200.0000 mg | ORAL_TABLET | Freq: Every evening | ORAL | Status: DC
  Administered 2017-09-29 – 2017-10-01 (×2): 200 mg via ORAL
  Filled 2017-09-29 (×2): qty 1

## 2017-09-29 MED ORDER — ACETAMINOPHEN 500 MG PO TABS: 1000.0000 mg | ORAL_TABLET | Freq: Four times a day (QID) | ORAL | Status: DC | PRN

## 2017-09-29 MED ORDER — IPRATROPIUM BROMIDE 0.02 % IN SOLN: 0.5000 mg | Freq: Four times a day (QID) | RESPIRATORY_TRACT | Status: DC | PRN

## 2017-09-29 MED ORDER — ONDANSETRON HCL 4 MG PO TABS: 4.0000 mg | ORAL_TABLET | Freq: Four times a day (QID) | ORAL | Status: DC | PRN

## 2017-09-29 MED ORDER — BUPROPION HCL ER (SR) 150 MG PO TB12
150.0000 mg | ORAL_TABLET | Freq: Two times a day (BID) | ORAL | Status: DC
  Administered 2017-09-30 – 2017-10-02 (×5): 150 mg via ORAL
  Filled 2017-09-29 (×6): qty 1

## 2017-09-29 MED ORDER — TRAMADOL HCL 50 MG PO TABS: 100.0000 mg | ORAL_TABLET | Freq: Two times a day (BID) | ORAL | Status: DC | PRN

## 2017-09-29 MED ORDER — CALCIUM CARBONATE-VITAMIN D 500-200 MG-UNIT PO TABS
1.0000 | ORAL_TABLET | Freq: Two times a day (BID) | ORAL | Status: DC
  Administered 2017-10-01 – 2017-10-02 (×2): 1 via ORAL
  Filled 2017-09-29 (×2): qty 1

## 2017-09-29 MED ORDER — ROPINIROLE HCL 1 MG PO TABS
2.0000 mg | ORAL_TABLET | Freq: Every day | ORAL | Status: DC
  Administered 2017-09-29 – 2017-10-01 (×3): 2 mg via ORAL
  Filled 2017-09-29 (×3): qty 2

## 2017-09-29 MED ORDER — ONDANSETRON HCL 4 MG/2ML IJ SOLN: 4.0000 mg | Freq: Four times a day (QID) | INTRAMUSCULAR | Status: DC | PRN

## 2017-09-29 MED ORDER — VITAMIN D 1000 UNITS PO TABS
1000.0000 [IU] | ORAL_TABLET | Freq: Every day | ORAL | Status: DC
  Administered 2017-10-02: 1000 [IU] via ORAL
  Filled 2017-09-29: qty 1

## 2017-09-29 MED ORDER — IPRATROPIUM BROMIDE 0.03 % NA SOLN
2.0000 | Freq: Two times a day (BID) | NASAL | Status: DC
  Administered 2017-09-29: 2 via NASAL
  Filled 2017-09-29: qty 30

## 2017-09-29 MED ORDER — METHOCARBAMOL 500 MG PO TABS: 500.0000 mg | ORAL_TABLET | Freq: Three times a day (TID) | ORAL | Status: DC | PRN

## 2017-09-29 MED ORDER — GABAPENTIN 300 MG PO CAPS
300.0000 mg | ORAL_CAPSULE | Freq: Three times a day (TID) | ORAL | Status: DC
  Administered 2017-09-29 – 2017-10-02 (×7): 300 mg via ORAL
  Filled 2017-09-29 (×7): qty 1

## 2017-09-29 MED ORDER — TAMSULOSIN HCL 0.4 MG PO CAPS
0.4000 mg | ORAL_CAPSULE | Freq: Two times a day (BID) | ORAL | Status: DC
  Administered 2017-09-29 – 2017-10-02 (×5): 0.4 mg via ORAL
  Filled 2017-09-29 (×5): qty 1

## 2017-09-29 MED ORDER — MAGNESIUM 250 MG PO TABS: 250.0000 mg | ORAL_TABLET | Freq: Every evening | ORAL | Status: DC

## 2017-09-29 MED ORDER — SODIUM CHLORIDE 0.9 % IV SOLN
INTRAVENOUS | Status: DC
  Administered 2017-09-29 – 2017-09-30 (×2): via INTRAVENOUS

## 2017-09-29 NOTE — ED Triage Notes (Signed)
Per pt, states he was standing at his cough when he felt a

## 2017-09-29 NOTE — ED Provider Notes (Signed)
[facility] DEPT Provider Note   CSN: [id number] Arrival date & time: 09/29/17  1750     History   Chief Complaint Chief Complaint  Patient presents with  . Rectal Bleeding    HPI Charles [name] is a 83 y.o. male.  HPI Complains of rectal bleeding onset this afternoon when he stood up, he noted bright red blood and clots per rectum.  He denies any pain anywhere denies lightheadedness no treatment prior to coming here.  Patient is chronically incontinent of stool for several years since back surgery.  No other associated symptoms no treatment prior to coming here Past Medical History:  Diagnosis Date  . Arthritis   . AVM (arteriovenous malformation)   . Barrett's esophagus   . BPH (benign prostatic hyperplasia)   . CAD (coronary artery disease)    only on stress test; never had MI, PCI or CABG.  In 2008, LVEF was reportedly normal.   . Cataract   . Constipation   . CPAP (continuous positive airway pressure) dependence   . Depression   . Diverticulosis   . Diverticulosis   . GERD (gastroesophageal reflux disease)   . History of radiation therapy   . Hyperlipidemia   . Hypoglycemia   . Lymphocytosis 06/2012  . Mastodynia   . Murmur   . Nasal congestion   . Osteoporosis   . Osteoporosis   . Prostate cancer ([place])   . Sleep apnea    wears CPAP  . Urinary retention with incomplete bladder emptying    pt uses bathroom and within 30 mins needs to go again  . Vitamin D deficiency     Patient Active Problem List   Diagnosis Date Noted  . Treatment-emergent central sleep apnea 06/25/2017  . Chronic bilateral low back pain with bilateral sciatica 01/17/2017  . Post laminectomy syndrome 01/17/2017  . Chronic pain syndrome 01/17/2017  . Sacroiliitis ([place]) 01/17/2017  . Piriformis syndrome of right side 01/17/2017  . Malignant neoplasm of prostate ([place]) 07/19/2016  . Fixation hardware in spine 05/15/2016  . Hypoglycemic reaction  02/02/2015  . Tremor, essential 12/11/2014  . OSA on CPAP 04/24/2014  . 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 Hypersomnia with sleep apnea, unspecified 04/24/2014  . Depression 04/18/2013  . BPH (benign prostatic hyperplasia) 04/18/2013  . Impingement syndrome of right shoulder 04/14/2013    Class: Acute  . Scoliosis or kyphoscoliosis 04/08/2013    Class: Chronic  . Lumbosacral spondylosis without myelopathy 04/08/2013    Class: Chronic  . Acute respiratory failure ([place]) 04/08/2013  . HTN (hypertension) 04/08/2013  . Lymphocytosis   . HIATAL HERNIA 11/21/2010  . DIVERTICULAR DISEASE 11/21/2010  . BARRETT'S ESOPHAGUS 02/22/2010  . GERD 07/20/2008  . INGUINAL HERNIA, LEFT, SMALL 07/20/2008  . OTH VENTRAL HERN W/O MENTION OBSTRUCTION/GANGREN 07/20/2008  . CONSTIPATION 07/20/2008  . DYSPHAGIA 07/20/2008    Past Surgical History:  Procedure Laterality Date  . AMPUTATION TOE Right 09/20/2017   Procedure: Right 4th toe amputation;  Surgeon: Wylene Simmer, MD;  Location: [facility];  Service: Orthopedics;  Laterality: Right;  foot block  . BACK SURGERY  04/2013  . back surgury     f  . BLEPHAROPLASTY Bilateral 02/23/15  . CATARACT EXTRACTION    . COLONOSCOPY    . EYE SURGERY Bilateral   . FINGER SURGERY Right    right thumb  . HARDWARE REMOVAL Right 05/15/2016   Procedure: Right Lumbar Two-Lumbar Five Removal of Hardware;  Surgeon: Mallie Mussel  Ellene Route, MD;  Location: [facility];  Service: Neurosurgery;  Laterality: Right;  Right L2-5 removal of hardware  . INGUINAL HERNIA REPAIR     x4  . REPAIR SPIGELIAN HERNIA  2011  . SPINAL FUSION  1996  . TONSILLECTOMY    . UPPER GASTROINTESTINAL ENDOSCOPY    . VASECTOMY         Home Medications    Prior to Admission medications   Medication Sig Start Date End Date Taking? Authorizing Provider  acetaminophen (TYLENOL) 500 MG tablet Take 1,000 mg by mouth every 6 (six) hours as needed for mild pain. TAKES WITH TRAMADOL     [provider]  aspirin EC 81 MG tablet Take 81 mg by mouth at bedtime.     [provider]  buPROPion (WELLBUTRIN SR) 150 MG 12 hr tablet Take 150 mg by mouth 2 (two) times daily.  06/28/11   [provider]  cholecalciferol (VITAMIN D) 1000 UNITS tablet Take 1,000 Units by mouth daily.    [provider]  ferrous sulfate 325 (65 FE) MG EC tablet Take 325 mg by mouth daily with breakfast.    [provider]  gabapentin (NEURONTIN) 300 MG capsule Take 1 tablet PO at 4 PM  and 1 tablet at 10 PM Patient taking differently: Take 300 mg by mouth 3 (three) times daily. Take 1 tablet PO at 8am, 2 at 4 PM  and 2 tablet at 10 PM 06/20/16   Ward Givens, NP  ipratropium (ATROVENT) 0.03 % nasal spray Place 2 sprays into both nostrils every 12 (twelve) hours.    [provider]  Leuprolide Acetate, 6 Month, (LUPRON DEPOT, 75-MONTH, IM) Inject into the muscle. NEXT INJECTION IS DUE 02-2017.  GETS AT [facility]    [provider]  lovastatin (MEVACOR) 20 MG tablet Take 20 mg by mouth at bedtime.  07/22/12   [provider]  Magnesium 250 MG TABS Take 250 mg by mouth every evening.     [provider]  methocarbamol (ROBAXIN) 500 MG tablet Take 500 mg by mouth every 8 (eight) hours as needed for muscle spasms.    [provider]  Multiple Minerals-Vitamins (CITRACAL PLUS BONE DENSITY) TABS Take 1 tablet by mouth 2 (two) times daily.     [provider]  Multiple Vitamins-Minerals (CENTRUM SILVER ADULT 50+) TABS Take 1 tablet by mouth daily with breakfast.    [provider]  nabumetone (RELAFEN) 500 MG tablet Take 500 mg by mouth 2 (two) times daily as needed for mild pain.     [provider]  Omega-3 Fatty Acids (FISH OIL ULTRA) 1400 MG CAPS Take 1,400 mg by mouth daily.    [provider]  omeprazole (PRILOSEC) 40 MG capsule Take 1 capsule (40 mg total) by mouth 2 (two) times  daily. 02/13/17   [name], MD  propranolol (INDERAL) 10 MG tablet Take 10 mg by mouth daily.     [provider]  ranitidine (ZANTAC) 300 MG tablet Take 1 tablet (300 mg total) by mouth at bedtime. 02/13/17   [name], MD  rOPINIRole (REQUIP) 1 MG tablet Take 2 tablets (2 mg total) by mouth at bedtime. 06/25/17   Dohmeier, Asencion Partridge, MD  senna (SENOKOT) 8.6 MG TABS tablet Take 1 tablet by mouth daily as needed for mild constipation.    [provider]  Tamsulosin HCl (FLOMAX) 0.4 MG CAPS Take 0.4 mg by mouth 2 (two) times daily.  08/30/11   [provider]  traMADol (ULTRAM) 50 MG tablet Take 2 tablets (100 mg total) by mouth 2 (two) times daily as needed for moderate pain. 05/15/16   Kristeen Miss, MD  triamcinolone cream (KENALOG) 0.1 % Apply 1 application topically daily as needed (RASH).    [provider]  zoledronic acid (RECLAST) 5 MG/100ML SOLN injection Inject into the vein. NEXT IS DUE 03-2017    [provider]    Family History Family History  Problem Relation Age of Onset  . Prostate cancer Father   . CAD Mother   . Arthritis-Osteo Brother     Social History Social History  Substance Use Topics  . Smoking status: Never Smoker  . Smokeless tobacco: Never Used  . Alcohol use No     Allergies   Celebrex [celecoxib] and Cyclobenzaprine   Review of Systems Review of Systems  Constitutional: Negative.   HENT: Negative.   Respiratory: Negative.   Cardiovascular: Negative.   Gastrointestinal: Positive for blood in stool.       Chronically incontinent stool  Musculoskeletal: Positive for gait problem.       Walks with walker with walker  Skin: Negative.   Psychiatric/Behavioral: Negative.   All other systems reviewed and are negative.    Physical Exam Updated Vital Signs BP (!) 148/76 (BP Location: Left Arm)   Pulse 63   Temp 98.1 F (36.7 C) (Oral)   Resp 16   SpO2 98%   Physical Exam  Constitutional:    Chronically ill-appearing  HENT:  Head: Normocephalic and atraumatic.  Eyes: Pupils are equal, round, and reactive to light. Conjunctivae are normal.  Neck: Neck supple. No tracheal deviation present. No thyromegaly present.  Cardiovascular: Normal rate and regular rhythm.   No murmur heard. Pulmonary/Chest: Effort normal and breath sounds normal.  Abdominal: Soft. Bowel sounds are normal. He exhibits no distension. There is no tenderness.  Genitourinary:  Genitourinary Comments: The normal tone, nontender.  Gross red blood  Musculoskeletal: Normal range of motion. He exhibits no edema or tenderness.  Neurological: He is alert. Coordination normal.  Skin: Skin is warm and dry. No rash noted.  Psychiatric: He has a normal mood and affect.  Nursing note and vitals reviewed.    ED Treatments / Results  Labs (all labs ordered are listed, but only abnormal results are displayed) Labs Reviewed  COMPREHENSIVE METABOLIC PANEL  CBC  PROTIME-INR  POC OCCULT BLOOD, ED  SAMPLE TO BLOOD BANK    EKG  EKG Interpretation None       Radiology No results found.  Procedures Procedures (including critical care time)  Medications Ordered in ED Medications - No data to display Results for orders placed or performed during the hospital encounter of 09/29/17  Comprehensive metabolic panel  Result Value Ref Range   Sodium 139 135 - 145 mmol/L   Potassium 4.0 3.5 - 5.1 mmol/L   Chloride 108 101 - 111 mmol/L   CO2 23 22 - 32 mmol/L   Glucose, Bld 117 (H) 65 - 99 mg/dL   BUN 25 (H) 6 - 20 mg/dL   Creatinine, Ser 0.99 0.61 - 1.24 mg/dL   Calcium 9.1 8.9 - 10.3 mg/dL   Total Protein 6.8 6.5 - 8.1 g/dL   Albumin 3.8 3.5 - 5.0 g/dL   AST 53 (H) 15 - 41 U/L   ALT 23 17 - 63 U/L   Alkaline Phosphatase 76 38 - 126 U/L   Total Bilirubin 0.4 0.3 -  1.2 mg/dL   GFR calc non Af Amer >60 >60 mL/min   GFR calc Af Amer >60 >60 mL/min   Anion gap 8 5 - 15  CBC  Result Value Ref Range   WBC  5.4 4.0 - 10.5 K/uL   RBC 3.86 (L) 4.22 - 5.81 MIL/uL   Hemoglobin 11.6 (L) 13.0 - 17.0 g/dL   HCT 35.0 (L) 39.0 - 52.0 %   MCV 90.7 78.0 - 100.0 fL   MCH 30.1 26.0 - 34.0 pg   MCHC 33.1 30.0 - 36.0 g/dL   RDW 15.1 11.5 - 15.5 %   Platelets 177 150 - 400 K/uL  Protime-INR - (order if Patient is taking Coumadin / Warfarin)  Result Value Ref Range   Prothrombin Time 13.8 11.4 - 15.2 seconds   INR 1.07   Sample to Blood Bank  Result Value Ref Range   Blood Bank Specimen SAMPLE AVAILABLE FOR TESTING    Sample Expiration 10/02/2017    No results found.  Initial Impression / Assessment and Plan / ED Course  I have reviewed the triage vital signs and the nursing notes.  Pertinent labs & imaging results that were available during my care of the patient were reviewed by me and considered in my medical decision making (see chart for details).    Patient is anemic with hemoglobin of 11.6 which is lower than 1 year ago however he is asymptomatic and hemodynamically stable.  He does not require emergent transfusion I consulted Dr. Carlean Purl from [facility] who will see patient in consultation tomorrow.  I have also consulted hospital service will arrange for overnight stay patient is anemic with hemoglobin of 11.6 Final Clinical Impressions(s) / ED Diagnoses  Diagnosis #1rectal bleeding Final diagnoses:  None  #2 normocytic anemia  New Prescriptions New Prescriptions   No medications on file     Orlie Dakin, MD 09/29/17 2106

## 2017-09-29 NOTE — ED Provider Notes (Signed)
MSE was initiated and I personally evaluated the patient and placed orders (if any) at  6:13 PM on September 29, 2017.  The patient appears stable so that the remainder of the MSE may be completed by another provider.  Blood pressure (!) 148/76, pulse 63, temperature 98.1 F (36.7 C), temperature source Oral, resp. rate 16, SpO2 98 %.  Charles [name] is a 83 y.o. male complaining of the surprising sensation of having a wet diaper.  He states that he has some very mild baseline seepage from prior back surgery but there was a large volume that he felt when he stood up earlier in the day.  This was surprising to him.  When he went to the restroom there was several clots which he brings with him.  He does not feel like the bleeding is continuing.  He does have a history of diverticulosis.  He is followed by gastroenterologist Dr. Fuller Plan.  He is not anticoagulated he the only medications that he is on is a low-dose aspirin and fish oil.  He denies any abdominal pain, chest pain, lightheadedness, palpitations.  He had to take a transfusion he would have that today.  Last colonoscopy was between 8 and 10 years ago.   Charles [name] 09/29/17 1827    Charles [name], Charles Ricks, PA-C 09/29/17 Artist Beach, MD 10/01/17 [phone]

## 2017-09-29 NOTE — ED Triage Notes (Signed)
Per pt, states history of stool incontinence-states he was standing up and felt he had a large loose stool-states he went to bathroom and noticed clots of blood in his underwear-states had a normal, large BM prior to incident-states no dizziness, SOB, abdominal pain-states he feels normal-incident happened once

## 2017-09-29 NOTE — H&P (Signed)
History and Physical   [facility] Admission History and Physical [employer], D.O.    Patient Name: Charles [name] MR#: [id number] Date of Birth: 25-Mar-1931 Date of Admission: 09/29/2017  Referring MD/NP/PA: Dr. Winfred Leeds Primary Care Physician: Leanna Battles, MD  Chief Complaint:  Chief Complaint  Patient presents with  . Rectal Bleeding    HPI: Charles [name] is a 83 y.o. male with a known history of chronic fecal incontinence, diverticulosis, Barrett's, GERD presents to the emergency department for evaluation of rectal bleeding.  Patient was in a usual state of health until this afternoon when he discovered a large amount of BRBPR upon standing.   He has been incontinent of stool since a back surgery many years ago.  He has also had GI bleeding in the past, follows with Dr. Fuller Plan with last colonoscopy 10 years ago.    Patient denies fevers/chills, weakness, dizziness, chest pain, shortness of breath, N/V/C/D, abdominal pain, dysuria/frequency, changes in mental status.    Otherwise there has been no change in status. Patient has been taking medication as prescribed and there has been no recent change in medication or diet.  No recent antibiotics.  There has been no recent illness, hospitalizations, travel or sick contacts.    EMS/ED Course: Medical admission has been requested for further workup and management of rectal bleeding.  Review of Systems:  CONSTITUTIONAL: No fever/chills, fatigue, weakness, weight gain/loss, headache. EYES: No blurry or double vision. ENT: No tinnitus, postnasal drip, redness or soreness of the oropharynx. RESPIRATORY: No cough, dyspnea, wheeze.  No hemoptysis.  CARDIOVASCULAR: No chest pain, palpitations, syncope, orthopnea. No lower extremity edema.  GASTROINTESTINAL: No nausea, vomiting, abdominal pain, diarrhea, constipation.  No hematemesis, melena. Positive hematochezia. GENITOURINARY: No  dysuria, frequency, hematuria. ENDOCRINE: No polyuria or nocturia. No heat or cold intolerance. HEMATOLOGY: No anemia, bruising, bleeding. INTEGUMENTARY: No rashes, ulcers, lesions. MUSCULOSKELETAL: No arthritis, gout, dyspnea. NEUROLOGIC: No numbness, tingling, ataxia, seizure-type activity, weakness. PSYCHIATRIC: No anxiety, depression, insomnia.   Past Medical History:  Diagnosis Date  . Arthritis   . AVM (arteriovenous malformation)   . Barrett's esophagus   . BPH (benign prostatic hyperplasia)   . CAD (coronary artery disease)    only on stress test; never had MI, PCI or CABG.  In 2008, LVEF was reportedly normal.   . Cataract   . Constipation   . CPAP (continuous positive airway pressure) dependence   . Depression   . Diverticulosis   . Diverticulosis   . GERD (gastroesophageal reflux disease)   . History of radiation therapy   . Hyperlipidemia   . Hypoglycemia   . Lymphocytosis 06/2012  . Mastodynia   . Murmur   . Nasal congestion   . Osteoporosis   . Osteoporosis   . Prostate cancer ([place])   . Sleep apnea    wears CPAP  . Urinary retention with incomplete bladder emptying    pt uses bathroom and within 30 mins needs to go again  . Vitamin D deficiency     Past Surgical History:  Procedure Laterality Date  . AMPUTATION TOE Right 09/20/2017   Procedure: Right 4th toe amputation;  Surgeon: Wylene Simmer, MD;  Location: [facility];  Service: Orthopedics;  Laterality: Right;  foot block  . BACK SURGERY  04/2013  . back surgury     f  . BLEPHAROPLASTY Bilateral 02/23/15  . CATARACT EXTRACTION    . COLONOSCOPY    . EYE SURGERY Bilateral   .  FINGER SURGERY Right    right thumb  . HARDWARE REMOVAL Right 05/15/2016   Procedure: Right Lumbar Two-Lumbar Five Removal of Hardware;  Surgeon: Kristeen Miss, MD;  Location: [facility];  Service: Neurosurgery;  Laterality: Right;  Right L2-5 removal of hardware  . INGUINAL HERNIA REPAIR     x4  . REPAIR  SPIGELIAN HERNIA  2011  . SPINAL FUSION  1996  . TONSILLECTOMY    . UPPER GASTROINTESTINAL ENDOSCOPY    . VASECTOMY       reports that he has never smoked. He has never used smokeless tobacco. He reports that he does not drink alcohol or use drugs.  Allergies  Allergen Reactions  . Celebrex [Celecoxib] Other (See Comments)    Created stomach ulcers.  . Cyclobenzaprine Other (See Comments)    Caught in hiatal hernia and caused extreme pain    Family History  Problem Relation Age of Onset  . Prostate cancer Father   . CAD Mother   . Arthritis-Osteo Brother     Prior to Admission medications   Medication Sig Start Date End Date Taking? Authorizing Provider  acetaminophen (TYLENOL) 500 MG tablet Take 1,000 mg by mouth every 6 (six) hours as needed for mild pain. TAKES WITH TRAMADOL   Yes [provider]  aspirin EC 81 MG tablet Take 81 mg by mouth at bedtime.    Yes [provider]  buPROPion (WELLBUTRIN SR) 150 MG 12 hr tablet Take 150 mg by mouth 2 (two) times daily.  06/28/11  Yes [provider]  cholecalciferol (VITAMIN D) 1000 UNITS tablet Take 1,000 Units by mouth daily.   Yes [provider]  ferrous sulfate 325 (65 FE) MG EC tablet Take 325 mg by mouth daily with breakfast.   Yes [provider]  gabapentin (NEURONTIN) 300 MG capsule Take 1 tablet PO at 4 PM  and 1 tablet at 10 PM Patient taking differently: Take 300 mg by mouth 3 (three) times daily. Take 1 tablet PO at 8am, 2 at 4 PM  and 2 tablet at 10 PM 06/20/16  Yes Millikan, Megan, NP  ipratropium (ATROVENT) 0.03 % nasal spray Place 2 sprays into both nostrils every 12 (twelve) hours.   Yes [provider]  Leuprolide Acetate, 6 Month, (LUPRON DEPOT, 38-MONTH, IM) Inject into the muscle. NEXT INJECTION IS DUE 02-2017.  GETS AT [facility]   Yes [provider]  lovastatin (MEVACOR) 20 MG tablet Take 20 mg by mouth at bedtime.  07/22/12  Yes [provider]  Magnesium 250 MG TABS Take 250 mg by mouth every evening.    Yes [provider]  methocarbamol (ROBAXIN) 500 MG tablet Take 500 mg by mouth every 8 (eight) hours as needed for muscle spasms.   Yes [provider]  Multiple Minerals-Vitamins (CITRACAL PLUS BONE DENSITY) TABS Take 1 tablet by mouth 2 (two) times daily.    Yes [provider]  Multiple Vitamins-Minerals (CENTRUM SILVER ADULT 50+) TABS Take 1 tablet by mouth daily with breakfast.   Yes [provider]  nabumetone (RELAFEN) 500 MG tablet Take 500 mg by mouth 2 (two) times daily as needed for mild pain.    Yes [provider]  Omega-3 Fatty Acids (FISH OIL ULTRA) 1400 MG CAPS Take 1,400 mg by mouth daily.   Yes [provider]  omeprazole (PRILOSEC) 40 MG capsule Take 1 capsule (40 mg total) by mouth 2 (two) times daily. 02/13/17  Yes Fuller Plan,  Pricilla Riffle, MD  propranolol (INDERAL) 10 MG tablet Take 10 mg by mouth daily.    Yes [provider]  ranitidine (ZANTAC) 300 MG tablet Take 1 tablet (300 mg total) by mouth at bedtime. 02/13/17  Yes [name], MD  rOPINIRole (REQUIP) 1 MG tablet Take 2 tablets (2 mg total) by mouth at bedtime. 06/25/17  Yes Dohmeier, Asencion Partridge, MD  senna (SENOKOT) 8.6 MG TABS tablet Take 1 tablet by mouth daily as needed for mild constipation.   Yes [provider]  Tamsulosin HCl (FLOMAX) 0.4 MG CAPS Take 0.4 mg by mouth 2 (two) times daily.  08/30/11  Yes [provider]  traMADol (ULTRAM) 50 MG tablet Take 2 tablets (100 mg total) by mouth 2 (two) times daily as needed for moderate pain. 05/15/16  Yes Kristeen Miss, MD  triamcinolone cream (KENALOG) 0.1 % Apply 1 application topically daily as needed (RASH).   Yes [provider]  zoledronic acid (RECLAST) 5 MG/100ML SOLN injection Inject into the vein. NEXT IS DUE 03-2017   Yes [provider]    Physical Exam: Vitals:   09/29/17 1805 09/29/17 2000  09/29/17 2030  BP: (!) 148/76 (!) 145/59 (!) 148/66  Pulse: 63 62 66  Resp: 16    Temp: 98.1 F (36.7 C)    TempSrc: Oral    SpO2: 98% 97% 93%    GENERAL: 83 y.o.-year-old male patient, well-developed, well-nourished lying in the bed in no acute distress.  Pleasant and cooperative.   HEENT: Head atraumatic, normocephalic. Pupils equal. Mucus membranes moist. NECK: Supple, full range of motion. No JVD, no bruit heard. No thyroid enlargement, no tenderness, no cervical lymphadenopathy. CHEST: Normal breath sounds bilaterally. No wheezing, rales, rhonchi or crackles. No use of accessory muscles of respiration.  No reproducible chest wall tenderness.  CARDIOVASCULAR: S1, S2 normal. No murmurs, rubs, or gallops. Cap refill <2 seconds. Pulses intact distally.  ABDOMEN: Soft, nondistended, nontender. No rebound, guarding, rigidity. Normoactive bowel sounds present in all four quadrants.  EXTREMITIES: No pedal edema, cyanosis, or clubbing. No calf tenderness or Homan's sign.  NEUROLOGIC: The patient is alert and oriented x 3. Cranial nerves II through XII are grossly intact with no focal sensorimotor deficit. PSYCHIATRIC:  Normal affect, mood, thought content. SKIN: Warm, dry, and intact without obvious rash, lesion, or ulcer.    Labs on Admission:  CBC:  Recent Labs Lab 09/29/17 1941  WBC 5.4  HGB 11.6*  HCT 35.0*  MCV 90.7  PLT 751   Basic Metabolic Panel:  Recent Labs Lab 09/29/17 1941  NA 139  K 4.0  CL 108  CO2 23  GLUCOSE 117*  BUN 25*  CREATININE 0.99  CALCIUM 9.1   GFR: Estimated Creatinine Clearance: 56 mL/min (by C-G formula based on SCr of 0.99 mg/dL). Liver Function Tests:  Recent Labs Lab 09/29/17 1941  AST 53*  ALT 23  ALKPHOS 76  BILITOT 0.4  PROT 6.8  ALBUMIN 3.8   No results for input(s): LIPASE, AMYLASE in the last 168 hours. No results for input(s): AMMONIA in the last 168 hours. Coagulation Profile:  Recent Labs Lab 09/29/17 1941  INR  1.07   Cardiac Enzymes: No results for input(s): CKTOTAL, CKMB, CKMBINDEX, TROPONINI in the last 168 hours. BNP (last 3 results) No results for input(s): PROBNP in the last 8760 hours. HbA1C: No results for input(s): HGBA1C in the last 72 hours. CBG: No results for input(s): GLUCAP in the last 168 hours. Lipid Profile: No  results for input(s): CHOL, HDL, LDLCALC, TRIG, CHOLHDL, LDLDIRECT in the last 72 hours. Thyroid Function Tests: No results for input(s): TSH, T4TOTAL, FREET4, T3FREE, THYROIDAB in the last 72 hours. Anemia Panel: No results for input(s): VITAMINB12, FOLATE, FERRITIN, TIBC, IRON, RETICCTPCT in the last 72 hours. Urine analysis:    Component Value Date/Time   COLORURINE YELLOW 03/31/2013 1042   APPEARANCEUR CLEAR 03/31/2013 1042   LABSPEC 1.015 01/04/2017 1627   PHURINE 6.5 01/04/2017 1627   PHURINE 6.5 03/31/2013 1042   GLUCOSEU Negative 01/04/2017 1627   HGBUR Trace 01/04/2017 1627   HGBUR NEGATIVE 03/31/2013 1042   BILIRUBINUR Negative 01/04/2017 1627   KETONESUR Negative 01/04/2017 1627   KETONESUR NEGATIVE 03/31/2013 1042   PROTEINUR Negative 01/04/2017 1627   PROTEINUR NEGATIVE 03/31/2013 1042   UROBILINOGEN 0.2 01/04/2017 1627   NITRITE Negative 01/04/2017 1627   NITRITE NEGATIVE 03/31/2013 1042   LEUKOCYTESUR Negative 01/04/2017 1627   Sepsis Labs: @LABRCNTIP (procalcitonin:4,lacticidven:4) )No results found for this or any previous visit (from the past 240 hour(s)).   Radiological Exams on Admission: No results found.  Assessment/Plan  This is a 83 y.o. male with a history of chronic fecal incontinence, diverticulosis, Barrett's, GERD  now being admitted with:  #. Lower GI Bleed with normocytic anemia -IV Protonix 40mg  BID -Serial CBCs -Nothing by mouth - IV fluid hydration -Hold anticoagulants: aspirin, fish oil, nabumetone, -GI consultation has been requested of [facility] by the EDP - Continue iron supplementation  #. History of  RLS - Continue Requip  #. History of BPH - Continue Flomax  #. History of depression - Continue Wellbutrin  #. History of HLD - Continue pravastatin  #. History of HTN - Continue propranolol  Admission status: Inpatient IV Fluids: NS Diet/Nutrition: NPO Consults called: GI  DVT Px: SCDs and early ambulation. Code Status: Full Code  Disposition Plan: To home in 1-2 days  All the records are reviewed and case discussed with ED provider. Management plans discussed with the patient and/or family who express understanding and agree with plan of care.  Shanard Treto D.O. on 09/29/2017 at 9:04 PM CC: Primary care physician; Leanna Battles, MD   09/29/2017, 9:04 PM

## 2017-09-30 ENCOUNTER — Encounter (HOSPITAL_COMMUNITY): Payer: Self-pay | Admitting: Internal Medicine

## 2017-09-30 DIAGNOSIS — D649 Anemia, unspecified: Secondary | ICD-10-CM

## 2017-09-30 DIAGNOSIS — K922 Gastrointestinal hemorrhage, unspecified: Secondary | ICD-10-CM

## 2017-09-30 LAB — CBC
HEMATOCRIT: 35.4 % — AB (ref 39.0–52.0)
HEMATOCRIT: 34.4 % — AB (ref 39.0–52.0)
HEMOGLOBIN: 11.7 g/dL — AB (ref 13.0–17.0)
HEMOGLOBIN: 11.5 g/dL — AB (ref 13.0–17.0)
MCH: 29.8 pg (ref 26.0–34.0)
MCH: 30.1 pg (ref 26.0–34.0)
MCHC: 33.1 g/dL (ref 30.0–36.0)
MCHC: 33.4 g/dL (ref 30.0–36.0)
MCV: 90.1 fL (ref 78.0–100.0)
MCV: 90.1 fL (ref 78.0–100.0)
Platelets: 171 10*3/uL (ref 150–400)
Platelets: 170 10*3/uL (ref 150–400)
RBC: 3.93 MIL/uL — AB (ref 4.22–5.81)
RBC: 3.82 MIL/uL — AB (ref 4.22–5.81)
RDW: 14.8 % (ref 11.5–15.5)
RDW: 15.2 % (ref 11.5–15.5)
WBC: 7.1 10*3/uL (ref 4.0–10.5)
WBC: 5.2 10*3/uL (ref 4.0–10.5)

## 2017-09-30 LAB — BASIC METABOLIC PANEL
ANION GAP: 6 (ref 5–15)
BUN: 21 mg/dL — AB (ref 6–20)
CO2: 24 mmol/L (ref 22–32)
Calcium: 8.7 mg/dL — ABNORMAL LOW (ref 8.9–10.3)
Chloride: 107 mmol/L (ref 101–111)
Creatinine, Ser: 0.88 mg/dL (ref 0.61–1.24)
GFR calc Af Amer: >60 mL/min (ref >60)
Glucose, Bld: 104 mg/dL — ABNORMAL HIGH (ref 65–99)
POTASSIUM: 3.8 mmol/L (ref 3.5–5.1)
SODIUM: 137 mmol/L (ref 135–145)

## 2017-09-30 LAB — GLUCOSE, CAPILLARY: Glucose-Capillary: 93 mg/dL (ref 65–99)

## 2017-09-30 LAB — HEMOGLOBIN
Hemoglobin: 11.3 g/dL — ABNORMAL LOW (ref 13.0–17.0)
Hemoglobin: 11.5 g/dL — ABNORMAL LOW (ref 13.0–17.0)

## 2017-09-30 MED ORDER — PEG-KCL-NACL-NASULF-NA ASC-C 100 G PO SOLR
0.5000 | Freq: Once | ORAL | Status: AC
  Administered 2017-09-30: 100 g via ORAL
  Filled 2017-09-30: qty 1

## 2017-09-30 MED ORDER — METOCLOPRAMIDE HCL 5 MG/ML IJ SOLN
10.0000 mg | Freq: Once | INTRAMUSCULAR | Status: AC
  Administered 2017-09-30: 10 mg via INTRAVENOUS
  Filled 2017-09-30: qty 2

## 2017-09-30 MED ORDER — METOCLOPRAMIDE HCL 5 MG/ML IJ SOLN
5.0000 mg | Freq: Once | INTRAMUSCULAR | Status: AC
  Administered 2017-09-30: 5 mg via INTRAVENOUS
  Filled 2017-09-30: qty 2

## 2017-09-30 NOTE — Progress Notes (Signed)
Patient arrived on the unit at approximately 2248 accompanied by spouse. He is alert and verbally responsive. No acute distress noted at this time and has voiced no complaints.

## 2017-09-30 NOTE — Progress Notes (Signed)
Patient passed small blood clot through rectum on pad.

## 2017-09-30 NOTE — Consult Note (Signed)
Consultation  Referring Provider:   Dr. Cephus Slater Primary Care Physician:  Leanna Battles, MD Primary Gastroenterologist:Dr. Fuller Plan         Reason for Consultation: GI Bleed          HPI:   Charles [name] is a 83 y.o. male with a past medical history of AVMs, Barrett's esophagus, CAD, GERD and multiple others listed below, who presented to the ER 09/29/17 with a complaint of rectal bleeding.    Today, patient describes that he has a history of back pain and due to this reason has to "stand up quite frequently".  Around 5 PM last night the patient was standing up in his living room looking at something on his shelves and "felt something fall out", he was about 12 feet from the bathroom and went there and checked his underwear.  Inside he found "2 big clots of blood which were dark in appearance".  His wife grew concerned and they came to the ER.  Since then the patient changed his underwear earlier this morning around 3 or 4 AM and did see a "dark streak" of blood on the pad there.       Patient denies fever, chills, bright red blood, abdominal pain, rectal pain, use of NSAIDs, heartburn, reflux, epigastric pain, dizziness or syncope.    ED workup: Hemoglobin 11.6 at time of admission and 11.5 this morning, last hemoglobin 05/08/16 was 14.2, BUN increased to 25 yesterday, down to 21 today, creatinine normal  Previous GI history: 02/13/17-office visit, Dr. 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 Fuller Plan: Barrett's esophagus, tortuous esophagus, moderate hiatal hernia and gastritis; that time no plans for further  examination 09/04/13-colonoscopy, Dr. Fuller Plan: Moderate-sized AVM in the ascending colon, moderate diverticulosis in the descending and sigmoid colon, at that time it was thought iron deficiency anemia was related to losses from AVM  Past Medical History:  Diagnosis Date  . Arthritis   . AVM (arteriovenous malformation)   . Barrett's esophagus   . BPH (benign prostatic hyperplasia)   . CAD (coronary artery disease)    only on stress test; never had MI, PCI or CABG.  In 2008, LVEF was reportedly normal.   . Cataract   . Constipation   . CPAP (continuous positive airway pressure) dependence   . Depression   . Diverticulosis   . Diverticulosis   . GERD (gastroesophageal reflux disease)   . History of radiation therapy   . Hyperlipidemia   . Hypoglycemia   . Lymphocytosis 06/2012  . Mastodynia   . Murmur   . Nasal congestion   . Osteoporosis   . Osteoporosis   . Prostate cancer ([place])   . Sleep apnea    wears CPAP  . Urinary retention with incomplete bladder emptying    pt uses bathroom and within 30 mins needs to go again  . Vitamin D deficiency     Past Surgical History:  Procedure Laterality Date  . AMPUTATION TOE Right 09/20/2017   Procedure: Right 4th toe amputation;  Surgeon: Wylene Simmer, MD;  Location: [facility];  Service: Orthopedics;  Laterality: Right;  foot block  . BACK SURGERY  04/2013  . back surgury     f  . BLEPHAROPLASTY Bilateral 02/23/15  . CATARACT EXTRACTION    . COLONOSCOPY    . EYE SURGERY Bilateral   . FINGER SURGERY Right    right thumb  . HARDWARE REMOVAL Right 05/15/2016   Procedure: Right Lumbar Two-Lumbar Five Removal of Hardware;  Surgeon: Kristeen Miss, MD;  Location: [facility];  Service: Neurosurgery;  Laterality: Right;  Right L2-5 removal of hardware  . INGUINAL HERNIA REPAIR     x4  . REPAIR SPIGELIAN HERNIA  2011  . SPINAL FUSION  1996  . TONSILLECTOMY    . UPPER GASTROINTESTINAL ENDOSCOPY    . VASECTOMY       Family History  Problem Relation Age of Onset  . Prostate cancer Father   . CAD Mother   . Arthritis-Osteo Brother      Social History  Substance Use Topics  . Smoking status: Never Smoker  . Smokeless tobacco: Never Used  . Alcohol use No    Prior to Admission medications   Medication Sig Start Date End Date Taking? Authorizing Provider  acetaminophen (TYLENOL) 500 MG tablet Take 1,000 mg by mouth every 6 (six) hours as needed for mild pain. TAKES WITH TRAMADOL   Yes [provider]  aspirin EC 81 MG tablet Take 81 mg by mouth at bedtime.    Yes [provider]  buPROPion (WELLBUTRIN SR) 150 MG 12 hr tablet Take 150 mg by mouth 2 (two) times daily.  06/28/11  Yes [provider]  cholecalciferol (VITAMIN D) 1000 UNITS tablet Take 1,000 Units by mouth daily.   Yes [provider]  ferrous sulfate 325 (65 FE) MG EC tablet Take 325 mg by mouth daily with breakfast.   Yes [provider]  gabapentin (NEURONTIN) 300 MG capsule Take 1 tablet PO at 4 PM  and 1 tablet at 10 PM Patient taking differently: Take 300 mg by mouth 3 (three) times daily. Take 1 tablet PO at 8am, 2 at 4 PM  and 2 tablet at 10 PM 06/20/16  Yes Millikan, Megan, NP  ipratropium (ATROVENT) 0.03 % nasal spray Place 2 sprays into both nostrils every 12 (twelve) hours.   Yes [provider]  Leuprolide Acetate, 6 Month, (LUPRON DEPOT, 72-MONTH, IM) Inject into the muscle. NEXT INJECTION IS DUE 02-2017.  GETS AT [facility]   Yes [provider]  lovastatin (MEVACOR) 20 MG tablet Take 20 mg by mouth at bedtime.  07/22/12  Yes [provider]  Magnesium 250 MG TABS Take 250 mg by mouth every evening.    Yes [provider]  methocarbamol (ROBAXIN) 500 MG tablet Take 500 mg by mouth every 8 (eight) hours as needed for muscle spasms.   Yes [provider]  Multiple Minerals-Vitamins (CITRACAL PLUS BONE DENSITY) TABS Take 1 tablet by  mouth 2 (two) times daily.    Yes [provider]  Multiple Vitamins-Minerals (CENTRUM SILVER ADULT 50+) TABS Take 1 tablet by mouth daily with breakfast.   Yes [provider]  nabumetone (RELAFEN) 500 MG tablet Take 500 mg by mouth 2 (two) times daily as needed for mild pain.    Yes [provider]  Omega-3 Fatty Acids (FISH OIL ULTRA) 1400 MG CAPS Take 1,400 mg by mouth daily.   Yes [provider]  omeprazole (PRILOSEC) 40 MG capsule Take 1 capsule (40 mg total) by  mouth 2 (two) times daily. 02/13/17  Yes [name], MD  propranolol (INDERAL) 10 MG tablet Take 10 mg by mouth daily.    Yes [provider]  ranitidine (ZANTAC) 300 MG tablet Take 1 tablet (300 mg total) by mouth at bedtime. 02/13/17  Yes [name], MD  rOPINIRole (REQUIP) 1 MG tablet Take 2 tablets (2 mg total) by mouth at bedtime. 06/25/17  Yes Dohmeier, Asencion Partridge, MD  senna (SENOKOT) 8.6 MG TABS tablet Take 1 tablet by mouth daily as needed for mild constipation.   Yes [provider]  Tamsulosin HCl (FLOMAX) 0.4 MG CAPS Take 0.4 mg by mouth 2 (two) times daily.  08/30/11  Yes [provider]  traMADol (ULTRAM) 50 MG tablet Take 2 tablets (100 mg total) by mouth 2 (two) times daily as needed for moderate pain. 05/15/16  Yes Kristeen Miss, MD  triamcinolone cream (KENALOG) 0.1 % Apply 1 application topically daily as needed (RASH).   Yes [provider]  zoledronic acid (RECLAST) 5 MG/100ML SOLN injection Inject into the vein. NEXT IS DUE 03-2017   Yes [provider]    Current Facility-Administered Medications  Medication Dose Route Frequency Provider Last Rate Last Dose  . 0.9 %  sodium chloride infusion   Intravenous Continuous Hugelmeyer, Alexis, DO 75 mL/hr at 09/29/17 2314    . acetaminophen (TYLENOL) tablet 1,000 mg  1,000 mg Oral Q6H PRN Hugelmeyer, Alexis, DO      . albuterol (PROVENTIL) (2.5 MG/3ML) 0.083% nebulizer solution 2.5 mg   2.5 mg Nebulization Q6H PRN Hugelmeyer, Alexis, DO      . buPROPion (WELLBUTRIN SR) 12 hr tablet 150 mg  150 mg Oral BID Hugelmeyer, Alexis, DO      . calcium-vitamin D (OSCAL WITH D) 500-200 MG-UNIT per tablet 1 tablet  1 tablet Oral BID WC Hugelmeyer, Alexis, DO      . cholecalciferol (VITAMIN D) tablet 1,000 Units  1,000 Units Oral Daily Hugelmeyer, Alexis, DO      . ferrous sulfate tablet 325 mg  325 mg Oral Q breakfast Hugelmeyer, Alexis, DO      . gabapentin (NEURONTIN) capsule 300 mg  300 mg Oral TID Hugelmeyer, Alexis, DO   300 mg at 09/29/17 2334  . ipratropium (ATROVENT) 0.03 % nasal spray 2 spray  2 spray Each Nare Q12H Hugelmeyer, Alexis, DO   2 spray at 09/29/17 2332  . magnesium oxide (MAG-OX) tablet 200 mg  200 mg Oral QPM Hugelmeyer, Alexis, DO   200 mg at 09/29/17 2333  . methocarbamol (ROBAXIN) tablet 500 mg  500 mg Oral Q8H PRN Hugelmeyer, Alexis, DO      . multivitamin with minerals tablet 1 tablet  1 tablet Oral Q breakfast Hugelmeyer, Alexis, DO      . ondansetron (ZOFRAN) tablet 4 mg  4 mg Oral Q6H PRN Hugelmeyer, Alexis, DO       Or  . ondansetron (ZOFRAN) injection 4 mg  4 mg Intravenous Q6H PRN Hugelmeyer, Alexis, DO      . pantoprazole (PROTONIX) injection 40 mg  40 mg Intravenous Q12H Hugelmeyer, Alexis, DO   40 mg at 09/29/17 2326  . pravastatin (PRAVACHOL) tablet 20 mg  20 mg Oral q1800 Hugelmeyer, Alexis, DO   20 mg at 09/29/17 2334  . propranolol (INDERAL) tablet 10 mg  10 mg Oral Daily Hugelmeyer, Alexis, DO      . rOPINIRole (REQUIP) tablet 2 mg  2 mg Oral QHS Hugelmeyer, Alexis, DO  2 mg at 09/29/17 2334  . tamsulosin (FLOMAX) capsule 0.4 mg  0.4 mg Oral BID Hugelmeyer, Alexis, DO   0.4 mg at 09/29/17 2340  . traMADol (ULTRAM) tablet 100 mg  100 mg Oral BID PRN Hugelmeyer, Alexis, DO        Allergies as of 09/29/2017 - Review Complete 09/20/2017  Allergen Reaction Noted  . Celebrex [celecoxib] Other (See Comments) 04/23/2012  . Cyclobenzaprine Other (See  Comments) 09/07/2015     Review of Systems:    Constitutional: No weight loss, fever, chills, weakness or fatigue Skin: No rash Cardiovascular: No chest pain  Respiratory: No SOB  Gastrointestinal: See HPI and otherwise negative Genitourinary: No dysuria  Neurological: No headache, dizziness or syncope Musculoskeletal: No new muscle or joint pain Hematologic: No bruising Psychiatric: No history of depression or anxiety   Physical Exam:  Vital signs in last 24 hours: Temp:  [97.5 F (36.4 C)-98.1 F (36.7 C)] 97.5 F (36.4 C) (10/28 0628) Pulse Rate:  [61-66] 63 (10/28 0628) Resp:  [15-18] 15 (10/28 0628) BP: (129-153)/(52-76) 130/52 (10/28 0628) SpO2:  [93 %-98 %] 95 % (10/28 0628) Weight:  [185 lb 3 oz (84 kg)] 185 lb 3 oz (84 kg) (10/28 0628) Last BM Date: 09/29/17 General:   Pleasant Caucasian male appears to be in NAD, Well developed, Well nourished, alert and cooperative Head:  Normocephalic and atraumatic. Eyes:   PEERL, EOMI. No icterus. Conjunctiva pink. Ears:  Normal auditory acuity. Neck:  Supple Throat: Oral cavity and pharynx without inflammation, swelling or lesion.  Lungs: Respirations even and unlabored. Lungs clear to auscultation bilaterally.   No wheezes, crackles, or rhonchi.  Heart: Normal S1, S2. No MRG. Regular rate and rhythm. No peripheral edema, cyanosis or pallor.  Abdomen:  Soft, nondistended, nontender. No rebound or guarding. Normal bowel sounds. No appreciable masses or hepatomegaly. Rectal: External exam: some streaking of maroon/dark blood on pad in underwear; Internal exam: no ttp, fissure or mass, decreased rectal tone, some dark maroon/black stool residue, no active bleeding Msk:  Symmetrical without gross deformities.  Extremities:  Without edema, no deformity or joint abnormality.  Neurologic:  Alert and  oriented x4;  grossly normal neurologically.   Skin:   Dry and intact without significant lesions or rashes. Psychiatric: Demonstrates  good judgement and reason without abnormal affect or behaviors.   LAB RESULTS:  Recent Labs  09/29/17 2315 09/30/17 0304 09/30/17 0812  WBC 4.4 7.1 5.2  HGB 11.4* 11.7* 11.5*  HCT 34.6* 35.4* 34.4*  PLT 166 171 170   BMET  Recent Labs  09/29/17 1941 09/30/17 0304  NA 139 137  K 4.0 3.8  CL 108 107  CO2 23 24  GLUCOSE 117* 104*  BUN 25* 21*  CREATININE 0.99 0.88  CALCIUM 9.1 8.7*   LFT  Recent Labs  09/29/17 1941  PROT 6.8  ALBUMIN 3.8  AST 53*  ALT 23  ALKPHOS 76  BILITOT 0.4   PT/INR  Recent Labs  09/29/17 1941  LABPROT 13.8  INR 1.07    PREVIOUS ENDOSCOPIES:            See HPI   Impression / Plan:   Impression: 1. Gi Bleed: dark clotted blood yesterday per pt, ER exam with bright red blood per report, maroon/dark blood via rectal during time of interview, history of AVMs and diverticula, hemoglobin unchanged overnight; consider relation to either diverticular bleed versus known AVMs vs possible upper GI source  2.  Acute on chronic anemia:  Around 14 last year down to 11's currently  Plan: 1.  Continue to observe with hemoglobin every 8 hours and transfusion as necessarily 2.  Patient may have clear liquids today 3.  Please await any further recommendations to Dr. Carlean Purl later today  Thank you for your kind consultation, we will continue to follow.  Lavone Nian Lemmon  09/30/2017, 9:28 AM Pager #: [phone]     [facility] Attending   I have taken an interval history, reviewed the chart and examined the patient. I agree with the Advanced Practitioner's note, impression and recommendations.    Patient was seen on September 30, 2017.  In the history of AVM in the colon that is a likely source of bleeding, though we are not sure could be diverticular or other.  Colonoscopy and upper GI endoscopy (possible) were ordered.The risks and benefits as well as alternatives of endoscopic procedure(s) have been discussed and reviewed. All questions  answered. The patient agrees to proceed.  Gatha Mayer, MD, [facility]

## 2017-09-30 NOTE — Progress Notes (Signed)
Called patients wife and made her aware that patient is being transferred to room 1529 and still waiting on time for colonoscopy.

## 2017-09-30 NOTE — Progress Notes (Signed)
PROGRESS NOTE    Charles [name]  VEL:[id number] DOB: August 23, 1931 DOA: 09/29/2017 PCP: Leanna Battles, MD    Brief Narrative:  83 y.o. male with a known history of chronic fecal incontinence, diverticulosis, Barrett's, GERD presents to the emergency department for evaluation of rectal bleeding.  Patient was in a usual state of health until this afternoon when he discovered a large amount of BRBPR upon standing.   He has been incontinent of stool since a back surgery many years ago.  He has also had GI bleeding in the past, follows with Dr. Fuller Plan with last colonoscopy 10 years ago.     Assessment & Plan:   Active Problems:   Lower GI bleeding   #. Lower GI Bleed with normocytic anemia -IV Protonix 40mg  BID (GI reexamined this morning and noted appearance almost like melena) -Serial CBCs -Clear liquid didet - IV fluid hydration -Hold anticoagulants: aspirin, fish oil, nabumetone, -Appreciate GI c/s and recs - Continue iron supplementation  #. History of RLS - Continue Requip  #. History of BPH - Continue Flomax  #. History of depression - Continue Wellbutrin  #. History of HLD - Continue pravastatin  #. History of HTN - Continue propranolol  # R 4th toe amputation: with boot and dressing to be removed this Friday  DVT prophylaxis: SCD Code Status: Full  Family Communication: wife at bedside Disposition Plan: pending improvement   Consultants:   GI  Procedures: (Don't include imaging studies which can be auto populated. Include things that cannot be auto populated i.e. Echo, Carotid and venous dopplers, Foley, Bipap, HD, tubes/drains, wound vac, central lines etc)  none  Antimicrobials: (specify start and planned stop date. Auto populated tables are space occupying and do not give end dates)  none    Subjective: No abdominal pain.  HA from not eating.  Felt release then gush of blood yesterday.  No LH or dizziness.    Objective: Vitals:   09/29/17 2100 09/29/17 2200 09/29/17 2240 09/30/17 0628  BP: (!) 153/64 (!) 148/65 129/60 (!) 130/52  Pulse: 62 61 64 63  Resp:   18 15  Temp:    (!) 97.5 F (36.4 C)  TempSrc:    Axillary  SpO2: 98% 93% 96% 95%  Weight:    84 kg (185 lb 3 oz)  Height:    5\' 6"  (1.676 m)    Intake/Output Summary (Last 24 hours) at 09/30/17 1039 Last data filed at 09/30/17 0655  Gross per 24 hour  Intake              574 ml  Output              350 ml  Net              224 ml   Filed Weights   09/30/17 0628  Weight: 84 kg (185 lb 3 oz)    Examination:  General: No acute distress. Cardiovascular: Heart sounds show a regular rate, and rhythm. No gallops or rubs. No murmurs. No JVD. Lungs: Clear to auscultation bilaterally with good air movement. No rales, rhonchi or wheezes. Abdomen: Soft, nontender, nondistended with normal active bowel sounds. No masses. No hepatosplenomegaly. Neurological: Alert and oriented 3. Moves all extremities 4 with equal strength. Cranial nerves II through XII grossly intact. Skin: Warm and dry. No rashes or lesions. Extremities: No clubbing or cyanosis. RLE with boot/dressing Psychiatric: Mood and affect are normal. Insight and judgment are appropriate.   Data Reviewed: I have  personally reviewed following labs and imaging studies  CBC:  Recent Labs Lab 09/29/17 1941 09/29/17 2315 09/30/17 0304 09/30/17 0812  WBC 5.4 4.4 7.1 5.2  HGB 11.6* 11.4* 11.7* 11.5*  HCT 35.0* 34.6* 35.4* 34.4*  MCV 90.7 89.9 90.1 90.1  PLT 177 166 171 517   Basic Metabolic Panel:  Recent Labs Lab 09/29/17 1941 09/30/17 0304  NA 139 137  K 4.0 3.8  CL 108 107  CO2 23 24  GLUCOSE 117* 104*  BUN 25* 21*  CREATININE 0.99 0.88  CALCIUM 9.1 8.7*   GFR: Estimated Creatinine Clearance: 63.5 mL/min (by C-G formula based on SCr of 0.88 mg/dL). Liver Function Tests:  Recent Labs Lab 09/29/17 1941  AST 53*  ALT 23  ALKPHOS 76  BILITOT 0.4  PROT 6.8  ALBUMIN 3.8    No results for input(s): LIPASE, AMYLASE in the last 168 hours. No results for input(s): AMMONIA in the last 168 hours. Coagulation Profile:  Recent Labs Lab 09/29/17 1941  INR 1.07   Cardiac Enzymes: No results for input(s): CKTOTAL, CKMB, CKMBINDEX, TROPONINI in the last 168 hours. BNP (last 3 results) No results for input(s): PROBNP in the last 8760 hours. HbA1C: No results for input(s): HGBA1C in the last 72 hours. CBG:  Recent Labs Lab 09/30/17 0822  GLUCAP 93   Lipid Profile: No results for input(s): CHOL, HDL, LDLCALC, TRIG, CHOLHDL, LDLDIRECT in the last 72 hours. Thyroid Function Tests: No results for input(s): TSH, T4TOTAL, FREET4, T3FREE, THYROIDAB in the last 72 hours. Anemia Panel: No results for input(s): VITAMINB12, FOLATE, FERRITIN, TIBC, IRON, RETICCTPCT in the last 72 hours. Sepsis Labs: No results for input(s): PROCALCITON, LATICACIDVEN in the last 168 hours.  No results found for this or any previous visit (from the past 240 hour(s)).       Radiology Studies: No results found.      Scheduled Meds: . buPROPion  150 mg Oral BID  . calcium-vitamin D  1 tablet Oral BID WC  . cholecalciferol  1,000 Units Oral Daily  . ferrous sulfate  325 mg Oral Q breakfast  . gabapentin  300 mg Oral TID  . ipratropium  2 spray Each Nare Q12H  . magnesium oxide  200 mg Oral QPM  . multivitamin with minerals  1 tablet Oral Q breakfast  . pantoprazole (PROTONIX) IV  40 mg Intravenous Q12H  . pravastatin  20 mg Oral q1800  . propranolol  10 mg Oral Daily  . rOPINIRole  2 mg Oral QHS  . tamsulosin  0.4 mg Oral BID   Continuous Infusions: . sodium chloride 75 mL/hr at 09/29/17 2314     LOS: 1 day    Time spent: over 30 min    Fayrene Helper, MD [facility] Pager [phone]  If 7PM-7AM, please contact night-coverage [email or web address] Password [email or web address] 09/30/2017, 10:39 AM

## 2017-09-30 NOTE — Evaluation (Signed)
Physical Therapy Evaluation Patient Details Name: Charles [name] MRN: [id number] DOB: September 30, 1931 Today's Date: 09/30/2017   History of Present Illness  83 y.o. male with a known history of chronic fecal incontinence, diverticulosis, Barrett's, GERD presents to the emergency department for evaluation of rectal bleeding.    Clinical Impression  Pt is modified independent with mobility, he ambulated 400' with RW without loss of balance. Encouraged pt to ambulate in halls at least BID to prevent deconditioning while in hospital. He is ready to return to [facility] from PT standpoint with no follow up PT nor DME needed. No further PT indicated, will sign off.     Follow Up Recommendations No PT follow up    Equipment Recommendations  None recommended by PT    Recommendations for Other Services       Precautions / Restrictions Precautions Precautions: None Precaution Comments: pt denies h/o falls in past 1 year Restrictions Weight Bearing Restrictions: No      Mobility  Bed Mobility Overal bed mobility: Modified Independent             General bed mobility comments: with rail  Transfers Overall transfer level: Modified independent Equipment used: Rolling walker (2 wheeled)                Ambulation/Gait Ambulation/Gait assistance: Modified independent (Device/Increase time) Ambulation Distance (Feet): 400 Feet Assistive device: Rolling walker (2 wheeled) Gait Pattern/deviations: WFL(Within Functional Limits);Trunk flexed   Gait velocity interpretation: at or above normal speed for age/gender General Gait Details: h/o 2 back surgeries, trunk forward flexed, pt can correct posture but stated it's uncomfortable, no LOB, SaO2 92%, HR 70s with walking  Stairs            Wheelchair Mobility    Modified Rankin (Stroke Patients Only)       Balance Overall balance assessment: Modified Independent                                            Pertinent Vitals/Pain Pain Assessment: No/denies pain    Home Living Family/patient expects to be discharged to:: Other (Comment) Living Arrangements: Spouse/significant other Available Help at Discharge: Family;Available 24 hours/day Type of Home: Independent living facility ([facility]) Home Access: Level entry     Home Layout: One level Home Equipment: Environmental consultant - 2 wheels      Prior Function Level of Independence: Independent with assistive device(s)         Comments: uses RW for long distance ambulation; independent ADLs     Hand Dominance   Dominant Hand: Right    Extremity/Trunk Assessment   Upper Extremity Assessment Upper Extremity Assessment: Overall WFL for tasks assessed    Lower Extremity Assessment Lower Extremity Assessment: Overall WFL for tasks assessed (pt reports decreased sensation to light touch B feet)    Cervical / Trunk Assessment Cervical / Trunk Assessment: Kyphotic (h/o 2 back surgeries, walks with trunk forward flexed)  Communication   Communication: No difficulties  Cognition Arousal/Alertness: Awake/alert Behavior During Therapy: WFL for tasks assessed/performed Overall Cognitive Status: Within Functional Limits for tasks assessed                                        General Comments      Exercises  Assessment/Plan    PT Assessment Patent does not need any further PT services  PT Problem List         PT Treatment Interventions      PT Goals (Current goals can be found in the Care Plan section)  Acute Rehab PT Goals PT Goal Formulation: All assessment and education complete, DC therapy    Frequency     Barriers to discharge        Co-evaluation               AM-PAC PT "6 Clicks" Daily Activity  Outcome Measure Difficulty turning over in bed (including adjusting bedclothes, sheets and blankets)?: None Difficulty moving from lying on back to sitting on the side of the bed? :  None Difficulty sitting down on and standing up from a chair with arms (e.g., wheelchair, bedside commode, etc,.)?: None Help needed moving to and from a bed to chair (including a wheelchair)?: None Help needed walking in hospital room?: None Help needed climbing 3-5 steps with a railing? : A Little 6 Click Score: 23    End of Session Equipment Utilized During Treatment: Gait belt Activity Tolerance: Patient tolerated treatment well Patient left: in chair;with call bell/phone within reach;with family/visitor present Nurse Communication: Mobility status      Time: 2563-8937 PT Time Calculation (min) (ACUTE ONLY): 20 min   Charges:   PT Evaluation $PT Eval Low Complexity: 1 Low     PT G CodesPhilomena Doheny 09/30/2017, 8:10 AM [phone]

## 2017-10-01 ENCOUNTER — Inpatient Hospital Stay (HOSPITAL_COMMUNITY): Payer: Medicare Other | Admitting: Anesthesiology

## 2017-10-01 ENCOUNTER — Encounter (HOSPITAL_COMMUNITY): Payer: Self-pay | Admitting: *Deleted

## 2017-10-01 ENCOUNTER — Encounter (HOSPITAL_COMMUNITY)
Admission: EM | Disposition: A | Discharge status: Home / Self Care | Payer: Self-pay | Source: Home / Self Care | Attending: Family Medicine

## 2017-10-01 DIAGNOSIS — Q399 Congenital malformation of esophagus, unspecified: Secondary | ICD-10-CM

## 2017-10-01 DIAGNOSIS — Z538 Procedure and treatment not carried out for other reasons: Secondary | ICD-10-CM

## 2017-10-01 DIAGNOSIS — K921 Melena: Secondary | ICD-10-CM

## 2017-10-01 DIAGNOSIS — K449 Diaphragmatic hernia without obstruction or gangrene: Secondary | ICD-10-CM

## 2017-10-01 DIAGNOSIS — K2270 Barrett's esophagus without dysplasia: Secondary | ICD-10-CM

## 2017-10-01 HISTORY — PX: COLONOSCOPY WITH PROPOFOL: SHX5780

## 2017-10-01 HISTORY — PX: ESOPHAGOGASTRODUODENOSCOPY (EGD) WITH PROPOFOL: SHX5813

## 2017-10-01 LAB — HEMOGLOBIN
HEMOGLOBIN: 11.5 g/dL — AB (ref 13.0–17.0)
HEMOGLOBIN: 11.6 g/dL — AB (ref 13.0–17.0)
HEMOGLOBIN: 11.6 g/dL — AB (ref 13.0–17.0)

## 2017-10-01 LAB — COMPREHENSIVE METABOLIC PANEL
ALT: 24 U/L (ref 17–63)
ANION GAP: 9 (ref 5–15)
AST: 51 U/L — ABNORMAL HIGH (ref 15–41)
Albumin: 3.9 g/dL (ref 3.5–5.0)
Alkaline Phosphatase: 62 U/L (ref 38–126)
BUN: 13 mg/dL (ref 6–20)
CALCIUM: 8.9 mg/dL (ref 8.9–10.3)
CHLORIDE: 112 mmol/L — AB (ref 101–111)
CO2: 23 mmol/L (ref 22–32)
Creatinine, Ser: 0.86 mg/dL (ref 0.61–1.24)
Glucose, Bld: 100 mg/dL — ABNORMAL HIGH (ref 65–99)
Potassium: 3.7 mmol/L (ref 3.5–5.1)
SODIUM: 144 mmol/L (ref 135–145)
Total Bilirubin: 0.7 mg/dL (ref 0.3–1.2)
Total Protein: 6.9 g/dL (ref 6.5–8.1)

## 2017-10-01 SURGERY — COLONOSCOPY WITH PROPOFOL
Anesthesia: Monitor Anesthesia Care

## 2017-10-01 MED ORDER — PROPOFOL 10 MG/ML IV BOLUS
INTRAVENOUS | Status: AC
  Filled 2017-10-01: qty 40

## 2017-10-01 MED ORDER — SODIUM CHLORIDE 0.9 % IV SOLN: INTRAVENOUS | Status: DC

## 2017-10-01 MED ORDER — PROPOFOL 500 MG/50ML IV EMUL
INTRAVENOUS | Status: DC | PRN
  Administered 2017-10-01: 75 ug/kg/min via INTRAVENOUS

## 2017-10-01 MED ORDER — LIDOCAINE 2% (20 MG/ML) 5 ML SYRINGE
INTRAMUSCULAR | Status: AC
  Filled 2017-10-01: qty 5

## 2017-10-01 MED ORDER — PROPOFOL 10 MG/ML IV BOLUS
INTRAVENOUS | Status: DC | PRN
  Administered 2017-10-01: 30 mg via INTRAVENOUS
  Administered 2017-10-01: 40 mg via INTRAVENOUS

## 2017-10-01 MED ORDER — PEG-KCL-NACL-NASULF-NA ASC-C 100 G PO SOLR: 1.0000 | Freq: Once | ORAL | Status: DC

## 2017-10-01 MED ORDER — PEG-KCL-NACL-NASULF-NA ASC-C 100 G PO SOLR
0.5000 | Freq: Once | ORAL | Status: AC
  Administered 2017-10-01: 100 g via ORAL
  Filled 2017-10-01: qty 1

## 2017-10-01 MED ORDER — PEG-KCL-NACL-NASULF-NA ASC-C 100 G PO SOLR
0.5000 | Freq: Once | ORAL | Status: AC
Start: 2017-10-02 — End: 2017-10-02
  Administered 2017-10-02: 100 g via ORAL

## 2017-10-01 MED ORDER — LACTATED RINGERS IV SOLN
INTRAVENOUS | Status: DC
Start: 2017-10-01 — End: 2017-10-01
  Administered 2017-10-01: 10:00:00 via INTRAVENOUS

## 2017-10-01 MED ORDER — LIDOCAINE 2% (20 MG/ML) 5 ML SYRINGE
INTRAMUSCULAR | Status: DC | PRN
  Administered 2017-10-01: 50 mg via INTRAVENOUS

## 2017-10-01 SURGICAL SUPPLY — 25 items

## 2017-10-01 NOTE — Progress Notes (Signed)
**Note De-identified vi Obfusction** PROGRESS NOTE    Hkeen E Rdwn  MRN:[id number] DOB: 08/27/1931 DOA: 09/29/2017 PCP: Pterson, Dniel, MD    Brief Nrrtive:  83 y.o. mle with  known history of chronic fecl incontinence, diverticulosis, Brrett's, GERD presents to the emergency deprtment for evlution of rectl bleeding.  Ptient ws in  usul stte of [place] until this fternoon when he discovered  lrge mount of BRBPR upon stnding.   He hs been incontinent of stool since  bck surgery mny yers go.  He hs lso hd GI bleeding in the pst, follows with Dr. Strk with lst colonoscopy 10 yers go.     Assessment & Pln:   Active Problems:   Lower GI bleeding   Hemtochezi   #. Lower GI Bleed with normocytic nemi -IV Protonix 40mg BID  -Seril CBCs -Cler liquid didet - IV fluid hydrtion -Hold nticogulnts: spirin, fish oil, nbumetone, -Apprecite GI c/s nd recs - Continue iron supplementtion [ ] repet colonoscopy tomorrow given poor prep tody EGD with hitl herni, brrett's, tortuous esophgus, norml stomch nd duodenum (no specimens) Colonoscopy with poor prep.  Pln for repet tomorrow. Non bleeding colonic ngioectsis seen.   #. History of RLS - Continue Requip  #. History of BPH - Continue Flomx  #. History of depression - Continue Wellbutrin  #. History of HLD - Continue prvsttin  #. History of HTN - Continue proprnolol  # R 4th toe mputtion: with boot nd dressing to be removed this Fridy  DVT prophylxis: SCD Code Sttus: Full  Fmily Communiction: wife t bedside Disposition Pln: pending improvement   Consultnts:   GI  Procedures: (Don't include imging studies which cn be uto populted. Include things tht cnnot be uto populted i.e. Echo, Crotid nd venous dopplers, Foley, Bipp, HD, tubes/drins, wound vc, centrl lines etc)  none  Antimicrobils: (specify strt nd plnned stop dte. Auto populted tbles re spce  occupying nd do not give end dtes)  none    Subjective: No LH, dizziness.  Drk stool lst night with prep.  Didn't sleep well.    Objective: Vitls:   10/01/17 1120 10/01/17 1130 10/01/17 1150 10/01/17 1358  BP: (!) 157/70 (!) 147/89 (!) 153/64 (!) 144/67  Pulse: (!) 57 60 (!) 57 (!) 59  Resp: 20 (!) 22 16 16  Temp:   98.6 F (37 C) 97.8 F (36.6 C)  TempSrc:   Axillry Axillry  SpO2: 100% 98% 100% 99%  Weight:      Height:        Intke/Output Summry (Lst 24 hours) t 10/01/17 1407 Lst dt filed t 10/01/17 1400  Gross per 24 hour  Intke           3317.5 ml  Output              400 ml  Net           2917.5 ml   Filed Weights   09/30/17 0628  Weight: 84 kg (185 lb 3 oz)    Exmintion:  Generl: No cute distress. Crdiovsculr: Hert sounds show  regulr rte, nd rhythm. No gllops or rubs. No murmurs. No JVD. Lungs: Cler to usculttion bilterlly with good ir movement. No rles, rhonchi or wheezes. Abdomen: Soft, nontender, nondistended with norml ctive bowel sounds. No msses. No heptosplenomegly. Neurologicl: Alert nd oriented 3. Moves ll extremities 4 with equl strength. Crnil nerves II through XII grossly intct. Skin: Wrm nd dry. No rshes or lesions. Extremities: No clubbing or cynosis. No edem.  RLE boot with intact dressing. Psychiatric: Mood and affect are normal. Insight and judgment are appropriate.  Data Reviewed: I have personally reviewed following labs and imaging studies  CBC:  Recent Labs Lab 09/29/17 1941 09/29/17 2315 09/30/17 0304 09/30/17 0812 09/30/17 1155 09/30/17 1816 10/01/17 0525 10/01/17 1157  WBC 5.4 4.4 7.1 5.2  --   --   --   --   HGB 11.6* 11.4* 11.7* 11.5* 11.3* 11.5* 11.5* 11.6*  HCT 35.0* 34.6* 35.4* 34.4*  --   --   --   --   MCV 90.7 89.9 90.1 90.1  --   --   --   --   PLT 177 166 171 170  --   --   --   --    Basic Metabolic Panel:  Recent Labs Lab 09/29/17 1941 09/30/17 0304  10/01/17 0525  NA 139 137 144  K 4.0 3.8 3.7  CL 108 107 112*  CO2 _0 GLUCOSE 117* 104* 100*  BUN 25* 21* 13  CREATININE 0.99 0.88 0.86  CALCIUM 9.1 8.7* 8.9   GFR: Estimated Creatinine Clearance: 65 mL/min (by C-G formula based on SCr of 0.86 mg/dL). Liver Function Tests:  Recent Labs Lab 09/29/17 1941 10/01/17 0525  AST 53* 51*  ALT 23 24  ALKPHOS 76 62  BILITOT 0.4 0.7  PROT 6.8 6.9  ALBUMIN 3.8 3.9   No results for input(s): LIPASE, AMYLASE in the last 168 hours. No results for input(s): AMMONIA in the last 168 hours. Coagulation Profile:  Recent Labs Lab 09/29/17 1941  INR 1.07   Cardiac Enzymes: No results for input(s): CKTOTAL, CKMB, CKMBINDEX, TROPONINI in the last 168 hours. BNP (last 3 results) No results for input(s): PROBNP in the last 8760 hours. HbA1C: No results for input(s): HGBA1C in the last 72 hours. CBG:  Recent Labs Lab 09/30/17 0822  GLUCAP 93   Lipid Profile: No results for input(s): CHOL, HDL, LDLCALC, TRIG, CHOLHDL, LDLDIRECT in the last 72 hours. Thyroid Function Tests: No results for input(s): TSH, T4TOTAL, FREET4, T3FREE, THYROIDAB in the last 72 hours. Anemia Panel: No results for input(s): VITAMINB12, FOLATE, FERRITIN, TIBC, IRON, RETICCTPCT in the last 72 hours. Sepsis Labs: No results for input(s): PROCALCITON, LATICACIDVEN in the last 168 hours.  No results found for this or any previous visit (from the past 240 hour(s)).       Radiology Studies: No results found.      Scheduled Meds: . buPROPion  150 mg Oral BID  . calcium-vitamin D  1 tablet Oral BID WC  . cholecalciferol  1,000 Units Oral Daily  . gabapentin  300 mg Oral TID  . ipratropium  2 spray Each Nare Q12H  . magnesium oxide  200 mg Oral QPM  . multivitamin with minerals  1 tablet Oral Q breakfast  . pantoprazole (PROTONIX) IV  40 mg Intravenous Q12H  . peg 3350 powder  0.5 kit Oral Once   And  . [START ON 10/02/2017] peg 3350 powder   0.5 kit Oral Once  . pravastatin  20 mg Oral q1800  . propranolol  10 mg Oral Daily  . rOPINIRole  2 mg Oral QHS  . tamsulosin  0.4 mg Oral BID   Continuous Infusions:    LOS: 2 days    Time spent: over 20 min    Fayrene Helper, MD [facility] Pager [phone]  If 7PM-7AM, please contact night-coverage [email or web address] Password [email or web address] 10/01/2017, 2:07 PM

## 2017-10-01 NOTE — Op Note (Signed)
[facility] Patient Name: Charles [name] Procedure Date: 10/01/2017 MRN: [id number] Attending MD: Estill Cotta. Loletha Carrow , MD Date of Birth: [birth year] CSN: [id number] Age: 83 Admit Type: Outpatient Procedure:                Upper GI endoscopy Indications:              Hematochezia Providers:                Mallie Mussel L. Loletha Carrow, MD, Cleda Daub, RN, William Dalton, Technician Referring MD:              Medicines:                Monitored Anesthesia Care Complications:            No immediate complications. Estimated Blood Loss:     Estimated blood loss: none. Procedure:                Pre-Anesthesia Assessment:                           - Prior to the procedure, a History and Physical                            was performed, and patient medications and                            allergies were reviewed. The patient's tolerance of                            previous anesthesia was also reviewed. The risks                            and benefits of the procedure and the sedation                            options and risks were discussed with the patient.                            All questions were answered, and informed consent                            was obtained. Prior Anticoagulants: The patient has                            taken no previous anticoagulant or antiplatelet                            agents. ASA Grade Assessment: III - A patient with                            severe systemic disease. After reviewing the risks  and benefits, the patient was deemed in                            satisfactory condition to undergo the procedure.                           After obtaining informed consent, the endoscope was                            passed under direct vision. Throughout the                            procedure, the patient's blood pressure, pulse, and                            oxygen saturations were  monitored continuously. The                            EG-[id number] ([id number]) scope was introduced through the                            mouth, and advanced to the second part of duodenum.                            The upper GI endoscopy was accomplished without                            difficulty. The patient tolerated the procedure                            well. Scope In: Scope Out: Findings:      A large hiatal hernia was present.      There were esophageal mucosal changes secondary to established       short-segment Barrett's disease present in the lower third of the       esophagus. The maximum longitudinal extent of these mucosal changes was       3 cm in length. There were no suspicious areas within the Barrett's.      The distal esophagus was significantly tortuous.      The stomach was normal.      The cardia and gastric fundus were normal on retroflexion.      The examined duodenum was normal. Impression:               - Large hiatal hernia.                           - Esophageal mucosal changes secondary to                            established short-segment Barrett's disease.                           - Tortuous esophagus.                           -  Normal stomach.                           - Normal examined duodenum.                           - No specimens collected. Moderate Sedation:      MAC sedation used Recommendation:           - Clear liquid diet.                           - Continue present medications. Procedure Code(s):        --- Professional ---                           [phone], Esophagogastroduodenoscopy, flexible,                            transoral; diagnostic, including collection of                            specimen(s) by brushing or washing, when performed                            (separate procedure) Diagnosis Code(s):        --- Professional ---                           K44.9, Diaphragmatic hernia without obstruction or                             gangrene                           K22.70, Barrett's esophagus without dysplasia                           Q39.9, Congenital malformation of esophagus,                            unspecified                           K92.1, Melena (includes Hematochezia) CPT copyright 2016 [employer]. All rights reserved. The codes documented in this report are preliminary and upon coder review may  be revised to meet current compliance requirements. Henry L. Loletha Carrow, MD 10/01/2017 10:56:02 AM This report has been signed electronically. Number of Addenda: 0

## 2017-10-01 NOTE — Interval H&P Note (Signed)
History and Physical Interval Note:  10/01/2017 10:11 AM  Charles [name]  has presented today for surgery, with the diagnosis of GI bleed  The various methods of treatment have been discussed with the patient and family. After consideration of risks, benefits and other options for treatment, the patient has consented to  Procedure(s): COLONOSCOPY WITH PROPOFOL (N/A) ESOPHAGOGASTRODUODENOSCOPY (EGD) WITH PROPOFOL (N/A) as a surgical intervention .  The patient's history has been reviewed, patient examined, no change in status, stable for surgery.  I have reviewed the patient's chart and labs.  Questions were answered to the patient's satisfaction.     Nelida Meuse III

## 2017-10-01 NOTE — Anesthesia Preprocedure Evaluation (Addendum)
Anesthesia Evaluation  Patient identified by MRN, date of birth, ID band Patient awake    Reviewed: Allergy & Precautions, NPO status , Patient's Chart, lab work & pertinent test results  Airway Mallampati: II  TM Distance: >3 FB Neck ROM: Full    Dental  (+) Teeth Intact, Dental Advisory Given   Pulmonary sleep apnea and Continuous Positive Airway Pressure Ventilation ,    breath sounds clear to auscultation       Cardiovascular hypertension, + CAD  + Valvular Problems/Murmurs  Rhythm:Regular Rate:Normal     Neuro/Psych PSYCHIATRIC DISORDERS Depression  Neuromuscular disease    GI/Hepatic Neg liver ROS, GERD  Medicated,  Endo/Other  negative endocrine ROS  Renal/GU negative Renal ROS     Musculoskeletal  (+) Arthritis ,   Abdominal   Peds  Hematology negative hematology ROS (+)   Anesthesia Other Findings - HLD  Reproductive/Obstetrics                            Lab Results  Component Value Date   WBC 5.2 09/30/2017   HGB 11.5 (L) 10/01/2017   HCT 34.4 (L) 09/30/2017   MCV 90.1 09/30/2017   PLT 170 09/30/2017     Anesthesia Physical Anesthesia Plan  ASA: III  Anesthesia Plan: MAC   Post-op Pain Management:    Induction: Intravenous  PONV Risk Score and Plan: 1 and Propofol infusion  Airway Management Planned: Natural Airway  Additional Equipment:   Intra-op Plan:   Post-operative Plan:   Informed Consent: I have reviewed the patients History and Physical, chart, labs and discussed the procedure including the risks, benefits and alternatives for the proposed anesthesia with the patient or authorized representative who has indicated his/her understanding and acceptance.     Plan Discussed with: CRNA  Anesthesia Plan Comments:         Anesthesia Quick Evaluation

## 2017-10-01 NOTE — Anesthesia Preprocedure Evaluation (Addendum)
Anesthesia Evaluation  Patient identified by MRN, date of birth, ID band Patient awake    Reviewed: Allergy & Precautions, NPO status , Patient's Chart, lab work & pertinent test results  Airway Mallampati: II  TM Distance: >3 FB Neck ROM: Full    Dental no notable dental hx.    Pulmonary neg pulmonary ROS, sleep apnea and Continuous Positive Airway Pressure Ventilation , COPD,    Pulmonary exam normal breath sounds clear to auscultation       Cardiovascular hypertension, Pt. on medications and Pt. on home beta blockers + CAD (??)  Normal cardiovascular exam Rhythm:Regular Rate:Normal  '08 LVEF was reportedly normal   Neuro/Psych negative neurological ROS  negative psych ROS   GI/Hepatic Neg liver ROS, GERD  Medicated,GI bleed   Endo/Other  negative endocrine ROS  Renal/GU negative Renal ROS   H/o prostate cancer negative genitourinary   Musculoskeletal negative musculoskeletal ROS (+) Arthritis , Osteoarthritis,    Abdominal   Peds negative pediatric ROS (+)  Hematology negative hematology ROS (+)   Anesthesia Other Findings   Reproductive/Obstetrics negative OB ROS                            Anesthesia Physical Anesthesia Plan  ASA: III  Anesthesia Plan: MAC   Post-op Pain Management:    Induction: Intravenous  PONV Risk Score and Plan: 0  Airway Management Planned: Simple Face Mask  Additional Equipment:   Intra-op Plan:   Post-operative Plan:   Informed Consent: I have reviewed the patients History and Physical, chart, labs and discussed the procedure including the risks, benefits and alternatives for the proposed anesthesia with the patient or authorized representative who has indicated his/her understanding and acceptance.   Dental advisory given  Plan Discussed with: CRNA and Surgeon  Anesthesia Plan Comments:         Anesthesia Quick Evaluation

## 2017-10-01 NOTE — Transfer of Care (Signed)
Immediate Anesthesia Transfer of Care Note  Patient: STEIN WINDHORST  Procedure(s) Performed: COLONOSCOPY WITH PROPOFOL (N/A ) ESOPHAGOGASTRODUODENOSCOPY (EGD) WITH PROPOFOL (N/A )  Patient Location: PACU  Anesthesia Type:MAC  Level of Consciousness: awake, alert  and oriented  Airway & Oxygen Therapy: Patient Spontanous Breathing and Patient connected to nasal cannula oxygen  Post-op Assessment: Report given to RN and Post -op Vital signs reviewed and stable  Post vital signs: Reviewed and stable  Last Vitals:  Vitals:   10/01/17 1055 10/01/17 1100  BP:  118/71  Pulse: (!) 55 (!) 55  Resp: 17 (!) 21  Temp: 36.4 C   SpO2: 98% 100%    Last Pain:  Vitals:   10/01/17 1055  TempSrc: Oral  PainSc:          Complications: No apparent anesthesia complications

## 2017-10-01 NOTE — Anesthesia Postprocedure Evaluation (Signed)
Anesthesia Post Note  Patient: Charles [name]  Procedure(s) Performed: COLONOSCOPY WITH PROPOFOL (N/A ) ESOPHAGOGASTRODUODENOSCOPY (EGD) WITH PROPOFOL (N/A )     Patient location during evaluation: PACU Anesthesia Type: MAC Level of consciousness: awake and alert Pain management: pain level controlled Vital Signs Assessment: post-procedure vital signs reviewed and stable Respiratory status: spontaneous breathing, nonlabored ventilation, respiratory function stable and patient connected to nasal cannula oxygen Cardiovascular status: stable and blood pressure returned to baseline Postop Assessment: no apparent nausea or vomiting Anesthetic complications: no    Last Vitals:  Vitals:   10/01/17 1130 10/01/17 1150  BP: (!) 147/89 (!) 153/64  Pulse: 60 (!) 57  Resp: (!) 22 16  Temp:  37 C  SpO2: 98% 100%    Last Pain:  Vitals:   10/01/17 1150  TempSrc: Axillary  PainSc:                  Effie Berkshire

## 2017-10-01 NOTE — H&P (View-Only) (Signed)
Consultation  Referring Provider:   Dr. Cephus Slater Primary Care Physician:  Leanna Battles, MD Primary Gastroenterologist:Dr. Fuller Plan         Reason for Consultation: GI Bleed          HPI:   Charles [name] is a 83 y.o. male with a past medical history of AVMs, Barrett's esophagus, CAD, GERD and multiple others listed below, who presented to the ER 09/29/17 with a complaint of rectal bleeding.    Today, patient describes that he has a history of back pain and due to this reason has to "stand up quite frequently".  Around 5 PM last night the patient was standing up in his living room looking at something on his shelves and "felt something fall out", he was about 12 feet from the bathroom and went there and checked his underwear.  Inside he found "2 big clots of blood which were dark in appearance".  His wife grew concerned and they came to the ER.  Since then the patient changed his underwear earlier this morning around 3 or 4 AM and did see a "dark streak" of blood on the pad there.       Patient denies fever, chills, bright red blood, abdominal pain, rectal pain, use of NSAIDs, heartburn, reflux, epigastric pain, dizziness or syncope.    ED workup: Hemoglobin 11.6 at time of admission and 11.5 this morning, last hemoglobin 05/08/16 was 14.2, BUN increased to 25 yesterday, down to 21 today, creatinine normal  Previous GI history: 02/13/17-office visit, Dr. 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 Fuller Plan: Barrett's esophagus, tortuous esophagus, moderate hiatal hernia and gastritis; that time no plans for further  examination 09/04/13-colonoscopy, Dr. Fuller Plan: Moderate-sized AVM in the ascending colon, moderate diverticulosis in the descending and sigmoid colon, at that time it was thought iron deficiency anemia was related to losses from AVM  Past Medical History:  Diagnosis Date  . Arthritis   . AVM (arteriovenous malformation)   . Barrett's esophagus   . BPH (benign prostatic hyperplasia)   . CAD (coronary artery disease)    only on stress test; never had MI, PCI or CABG.  In 2008, LVEF was reportedly normal.   . Cataract   . Constipation   . CPAP (continuous positive airway pressure) dependence   . Depression   . Diverticulosis   . Diverticulosis   . GERD (gastroesophageal reflux disease)   . History of radiation therapy   . Hyperlipidemia   . Hypoglycemia   . Lymphocytosis 06/2012  . Mastodynia   . Murmur   . Nasal congestion   . Osteoporosis   . Osteoporosis   . Prostate cancer ([place])   . Sleep apnea    wears CPAP  . Urinary retention with incomplete bladder emptying    pt uses bathroom and within 30 mins needs to go again  . Vitamin D deficiency     Past Surgical History:  Procedure Laterality Date  . AMPUTATION TOE Right 09/20/2017   Procedure: Right 4th toe amputation;  Surgeon: Wylene Simmer, MD;  Location: [facility];  Service: Orthopedics;  Laterality: Right;  foot block  . BACK SURGERY  04/2013  . back surgury     f  . BLEPHAROPLASTY Bilateral 02/23/15  . CATARACT EXTRACTION    . COLONOSCOPY    . EYE SURGERY Bilateral   . FINGER SURGERY Right    right thumb  . HARDWARE REMOVAL Right 05/15/2016   Procedure: Right Lumbar Two-Lumbar Five Removal of Hardware;  Surgeon: Kristeen Miss, MD;  Location: [facility];  Service: Neurosurgery;  Laterality: Right;  Right L2-5 removal of hardware  . INGUINAL HERNIA REPAIR     x4  . REPAIR SPIGELIAN HERNIA  2011  . SPINAL FUSION  1996  . TONSILLECTOMY    . UPPER GASTROINTESTINAL ENDOSCOPY    . VASECTOMY       Family History  Problem Relation Age of Onset  . Prostate cancer Father   . CAD Mother   . Arthritis-Osteo Brother      Social History  Substance Use Topics  . Smoking status: Never Smoker  . Smokeless tobacco: Never Used  . Alcohol use No    Prior to Admission medications   Medication Sig Start Date End Date Taking? Authorizing Provider  acetaminophen (TYLENOL) 500 MG tablet Take 1,000 mg by mouth every 6 (six) hours as needed for mild pain. TAKES WITH TRAMADOL   Yes [provider]  aspirin EC 81 MG tablet Take 81 mg by mouth at bedtime.    Yes [provider]  buPROPion (WELLBUTRIN SR) 150 MG 12 hr tablet Take 150 mg by mouth 2 (two) times daily.  06/28/11  Yes [provider]  cholecalciferol (VITAMIN D) 1000 UNITS tablet Take 1,000 Units by mouth daily.   Yes [provider]  ferrous sulfate 325 (65 FE) MG EC tablet Take 325 mg by mouth daily with breakfast.   Yes [provider]  gabapentin (NEURONTIN) 300 MG capsule Take 1 tablet PO at 4 PM  and 1 tablet at 10 PM Patient taking differently: Take 300 mg by mouth 3 (three) times daily. Take 1 tablet PO at 8am, 2 at 4 PM  and 2 tablet at 10 PM 06/20/16  Yes Millikan, Megan, NP  ipratropium (ATROVENT) 0.03 % nasal spray Place 2 sprays into both nostrils every 12 (twelve) hours.   Yes [provider]  Leuprolide Acetate, 6 Month, (LUPRON DEPOT, 60-MONTH, IM) Inject into the muscle. NEXT INJECTION IS DUE 02-2017.  GETS AT [facility]   Yes [provider]  lovastatin (MEVACOR) 20 MG tablet Take 20 mg by mouth at bedtime.  07/22/12  Yes [provider]  Magnesium 250 MG TABS Take 250 mg by mouth every evening.    Yes [provider]  methocarbamol (ROBAXIN) 500 MG tablet Take 500 mg by mouth every 8 (eight) hours as needed for muscle spasms.   Yes [provider]  Multiple Minerals-Vitamins (CITRACAL PLUS BONE DENSITY) TABS Take 1 tablet by  mouth 2 (two) times daily.    Yes [provider]  Multiple Vitamins-Minerals (CENTRUM SILVER ADULT 50+) TABS Take 1 tablet by mouth daily with breakfast.   Yes [provider]  nabumetone (RELAFEN) 500 MG tablet Take 500 mg by mouth 2 (two) times daily as needed for mild pain.    Yes [provider]  Omega-3 Fatty Acids (FISH OIL ULTRA) 1400 MG CAPS Take 1,400 mg by mouth daily.   Yes [provider]  omeprazole (PRILOSEC) 40 MG capsule Take 1 capsule (40 mg total) by  mouth 2 (two) times daily. 02/13/17  Yes [name], MD  propranolol (INDERAL) 10 MG tablet Take 10 mg by mouth daily.    Yes [provider]  ranitidine (ZANTAC) 300 MG tablet Take 1 tablet (300 mg total) by mouth at bedtime. 02/13/17  Yes [name], MD  rOPINIRole (REQUIP) 1 MG tablet Take 2 tablets (2 mg total) by mouth at bedtime. 06/25/17  Yes Dohmeier, Asencion Partridge, MD  senna (SENOKOT) 8.6 MG TABS tablet Take 1 tablet by mouth daily as needed for mild constipation.   Yes [provider]  Tamsulosin HCl (FLOMAX) 0.4 MG CAPS Take 0.4 mg by mouth 2 (two) times daily.  08/30/11  Yes [provider]  traMADol (ULTRAM) 50 MG tablet Take 2 tablets (100 mg total) by mouth 2 (two) times daily as needed for moderate pain. 05/15/16  Yes Kristeen Miss, MD  triamcinolone cream (KENALOG) 0.1 % Apply 1 application topically daily as needed (RASH).   Yes [provider]  zoledronic acid (RECLAST) 5 MG/100ML SOLN injection Inject into the vein. NEXT IS DUE 03-2017   Yes [provider]    Current Facility-Administered Medications  Medication Dose Route Frequency Provider Last Rate Last Dose  . 0.9 %  sodium chloride infusion   Intravenous Continuous Hugelmeyer, Alexis, DO 75 mL/hr at 09/29/17 2314    . acetaminophen (TYLENOL) tablet 1,000 mg  1,000 mg Oral Q6H PRN Hugelmeyer, Alexis, DO      . albuterol (PROVENTIL) (2.5 MG/3ML) 0.083% nebulizer solution 2.5 mg   2.5 mg Nebulization Q6H PRN Hugelmeyer, Alexis, DO      . buPROPion (WELLBUTRIN SR) 12 hr tablet 150 mg  150 mg Oral BID Hugelmeyer, Alexis, DO      . calcium-vitamin D (OSCAL WITH D) 500-200 MG-UNIT per tablet 1 tablet  1 tablet Oral BID WC Hugelmeyer, Alexis, DO      . cholecalciferol (VITAMIN D) tablet 1,000 Units  1,000 Units Oral Daily Hugelmeyer, Alexis, DO      . ferrous sulfate tablet 325 mg  325 mg Oral Q breakfast Hugelmeyer, Alexis, DO      . gabapentin (NEURONTIN) capsule 300 mg  300 mg Oral TID Hugelmeyer, Alexis, DO   300 mg at 09/29/17 2334  . ipratropium (ATROVENT) 0.03 % nasal spray 2 spray  2 spray Each Nare Q12H Hugelmeyer, Alexis, DO   2 spray at 09/29/17 2332  . magnesium oxide (MAG-OX) tablet 200 mg  200 mg Oral QPM Hugelmeyer, Alexis, DO   200 mg at 09/29/17 2333  . methocarbamol (ROBAXIN) tablet 500 mg  500 mg Oral Q8H PRN Hugelmeyer, Alexis, DO      . multivitamin with minerals tablet 1 tablet  1 tablet Oral Q breakfast Hugelmeyer, Alexis, DO      . ondansetron (ZOFRAN) tablet 4 mg  4 mg Oral Q6H PRN Hugelmeyer, Alexis, DO       Or  . ondansetron (ZOFRAN) injection 4 mg  4 mg Intravenous Q6H PRN Hugelmeyer, Alexis, DO      . pantoprazole (PROTONIX) injection 40 mg  40 mg Intravenous Q12H Hugelmeyer, Alexis, DO   40 mg at 09/29/17 2326  . pravastatin (PRAVACHOL) tablet 20 mg  20 mg Oral q1800 Hugelmeyer, Alexis, DO   20 mg at 09/29/17 2334  . propranolol (INDERAL) tablet 10 mg  10 mg Oral Daily Hugelmeyer, Alexis, DO      . rOPINIRole (REQUIP) tablet 2 mg  2 mg Oral QHS Hugelmeyer, Alexis, DO  2 mg at 09/29/17 2334  . tamsulosin (FLOMAX) capsule 0.4 mg  0.4 mg Oral BID Hugelmeyer, Alexis, DO   0.4 mg at 09/29/17 2340  . traMADol (ULTRAM) tablet 100 mg  100 mg Oral BID PRN Hugelmeyer, Alexis, DO        Allergies as of 09/29/2017 - Review Complete 09/20/2017  Allergen Reaction Noted  . Celebrex [celecoxib] Other (See Comments) 04/23/2012  . Cyclobenzaprine Other (See  Comments) 09/07/2015     Review of Systems:    Constitutional: No weight loss, fever, chills, weakness or fatigue Skin: No rash Cardiovascular: No chest pain  Respiratory: No SOB  Gastrointestinal: See HPI and otherwise negative Genitourinary: No dysuria  Neurological: No headache, dizziness or syncope Musculoskeletal: No new muscle or joint pain Hematologic: No bruising Psychiatric: No history of depression or anxiety   Physical Exam:  Vital signs in last 24 hours: Temp:  [97.5 F (36.4 C)-98.1 F (36.7 C)] 97.5 F (36.4 C) (10/28 0628) Pulse Rate:  [61-66] 63 (10/28 0628) Resp:  [15-18] 15 (10/28 0628) BP: (129-153)/(52-76) 130/52 (10/28 0628) SpO2:  [93 %-98 %] 95 % (10/28 0628) Weight:  [185 lb 3 oz (84 kg)] 185 lb 3 oz (84 kg) (10/28 0628) Last BM Date: 09/29/17 General:   Pleasant Caucasian male appears to be in NAD, Well developed, Well nourished, alert and cooperative Head:  Normocephalic and atraumatic. Eyes:   PEERL, EOMI. No icterus. Conjunctiva pink. Ears:  Normal auditory acuity. Neck:  Supple Throat: Oral cavity and pharynx without inflammation, swelling or lesion.  Lungs: Respirations even and unlabored. Lungs clear to auscultation bilaterally.   No wheezes, crackles, or rhonchi.  Heart: Normal S1, S2. No MRG. Regular rate and rhythm. No peripheral edema, cyanosis or pallor.  Abdomen:  Soft, nondistended, nontender. No rebound or guarding. Normal bowel sounds. No appreciable masses or hepatomegaly. Rectal: External exam: some streaking of maroon/dark blood on pad in underwear; Internal exam: no ttp, fissure or mass, decreased rectal tone, some dark maroon/black stool residue, no active bleeding Msk:  Symmetrical without gross deformities.  Extremities:  Without edema, no deformity or joint abnormality.  Neurologic:  Alert and  oriented x4;  grossly normal neurologically.   Skin:   Dry and intact without significant lesions or rashes. Psychiatric: Demonstrates  good judgement and reason without abnormal affect or behaviors.   LAB RESULTS:  Recent Labs  09/29/17 2315 09/30/17 0304 09/30/17 0812  WBC 4.4 7.1 5.2  HGB 11.4* 11.7* 11.5*  HCT 34.6* 35.4* 34.4*  PLT 166 171 170   BMET  Recent Labs  09/29/17 1941 09/30/17 0304  NA 139 137  K 4.0 3.8  CL 108 107  CO2 23 24  GLUCOSE 117* 104*  BUN 25* 21*  CREATININE 0.99 0.88  CALCIUM 9.1 8.7*   LFT  Recent Labs  09/29/17 1941  PROT 6.8  ALBUMIN 3.8  AST 53*  ALT 23  ALKPHOS 76  BILITOT 0.4   PT/INR  Recent Labs  09/29/17 1941  LABPROT 13.8  INR 1.07    PREVIOUS ENDOSCOPIES:            See HPI   Impression / Plan:   Impression: 1. Gi Bleed: dark clotted blood yesterday per pt, ER exam with bright red blood per report, maroon/dark blood via rectal during time of interview, history of AVMs and diverticula, hemoglobin unchanged overnight; consider relation to either diverticular bleed versus known AVMs vs possible upper GI source  2.  Acute on chronic anemia:  Around 14 last year down to 11's currently  Plan: 1.  Continue to observe with hemoglobin every 8 hours and transfusion as necessarily 2.  Patient may have clear liquids today 3.  Please await any further recommendations to Dr. Carlean Purl later today  Thank you for your kind consultation, we will continue to follow.  Lavone Nian Lemmon  09/30/2017, 9:28 AM Pager #: [phone]     [facility] Attending   I have taken an interval history, reviewed the chart and examined the patient. I agree with the Advanced Practitioner's note, impression and recommendations.    Patient was seen on September 30, 2017.  In the history of AVM in the colon that is a likely source of bleeding, though we are not sure could be diverticular or other.  Colonoscopy and upper GI endoscopy (possible) were ordered.The risks and benefits as well as alternatives of endoscopic procedure(s) have been discussed and reviewed. All questions  answered. The patient agrees to proceed.  Gatha Mayer, MD, [facility]

## 2017-10-01 NOTE — Op Note (Signed)
[facility] Patient Name: Charles [name] Procedure Date: 10/01/2017 MRN: [id number] Attending MD: Estill Cotta. Loletha Carrow , MD Date of Birth: February 22, 1931 CSN: [id number] Age: 83 Admit Type: Inpatient Procedure:                Colonoscopy Indications:              Hematochezia Providers:                Mallie Mussel L. Loletha Carrow, MD, Cleda Daub, RN, William Dalton, Technician Referring MD:              Medicines:                Monitored Anesthesia Care Complications:            No immediate complications. Estimated Blood Loss:     Estimated blood loss: none. Procedure:                Pre-Anesthesia Assessment:                           - Prior to the procedure, a History and Physical                            was performed, and patient medications and                            allergies were reviewed. The patient's tolerance of                            previous anesthesia was also reviewed. The risks                            and benefits of the procedure and the sedation                            options and risks were discussed with the patient.                            All questions were answered, and informed consent                            was obtained. Prior Anticoagulants: The patient has                            taken no previous anticoagulant or antiplatelet                            agents. ASA Grade Assessment: III - A patient with                            severe systemic disease. After reviewing the risks                            and benefits,  the patient was deemed in                            satisfactory condition to undergo the procedure.                           After obtaining informed consent, the colonoscope                            was passed under direct vision. Throughout the                            procedure, the patient's blood pressure, pulse, and                            oxygen saturations were  monitored continuously. The                            EC-[id number] ([id number]) scope was introduced through                            the anus and advanced to the the transverse colon.                            The colonoscopy was performed with difficulty due                            to inadequate bowel prep. The patient tolerated the                            procedure well. The quality of the bowel                            preparation was poor. The rectum was photographed. Scope In: 10:24:49 AM Scope Out: 10:35:19 AM Total Procedure Duration: 0 hours 10 minutes 30 seconds  Findings:      The perianal and digital rectal examinations were normal.      A large amount of stool was found in the entire colon, precluding       visualization. The scope could theraofr only be advanced to the proximal       transverse colon. at which time the procedure was aborted.      Diverticula were found in the left colon.      Multiple angioectasias without bleeding were found in the rectum       consistent with radiation proctopathy.      Retroflexion in the rectum was not performed. Impression:               - Preparation of the colon was poor.                           - Stool in the entire examined colon.                           - Diverticulosis in the left colon.                           -  Multiple non-bleeding colonic angioectasias.                           - No specimens collected. Moderate Sedation:      MAC sedation used Recommendation:           - Clear liquid diet.                           - Repeat colonoscopy tomorrow with better                            preparation.                           - Continue present medications.                           - See the other procedure note for documentation of                            additional recommendations. Procedure Code(s):        --- Professional ---                           [phone], 31, Colonoscopy, flexible; diagnostic,                             including collection of specimen(s) by brushing or                            washing, when performed (separate procedure) Diagnosis Code(s):        --- Professional ---                           K55.20, Angiodysplasia of colon without hemorrhage                           K92.1, Melena (includes Hematochezia)                           K57.30, Diverticulosis of large intestine without                            perforation or abscess without bleeding CPT copyright 2016 American [employer] Association. All rights reserved. The codes documented in this report are preliminary and upon coder review may  be revised to meet current compliance requirements. Bryanne Riquelme L. Loletha Carrow, MD 10/01/2017 10:52:11 AM This report has been signed electronically. Number of Addenda: 0

## 2017-10-02 ENCOUNTER — Inpatient Hospital Stay (HOSPITAL_COMMUNITY): Payer: Medicare Other | Admitting: Anesthesiology

## 2017-10-02 ENCOUNTER — Encounter (HOSPITAL_COMMUNITY)
Admission: EM | Disposition: A | Discharge status: Home / Self Care | Payer: Self-pay | Source: Home / Self Care | Attending: Family Medicine

## 2017-10-02 ENCOUNTER — Encounter (HOSPITAL_COMMUNITY): Payer: Self-pay | Admitting: *Deleted

## 2017-10-02 DIAGNOSIS — K5730 Diverticulosis of large intestine without perforation or abscess without bleeding: Secondary | ICD-10-CM

## 2017-10-02 DIAGNOSIS — K5520 Angiodysplasia of colon without hemorrhage: Secondary | ICD-10-CM

## 2017-10-02 HISTORY — PX: COLONOSCOPY: SHX5424

## 2017-10-02 LAB — HEMOGLOBIN
Hemoglobin: 11.6 g/dL — ABNORMAL LOW (ref 13.0–17.0)
Hemoglobin: 11.1 g/dL — ABNORMAL LOW (ref 13.0–17.0)

## 2017-10-02 SURGERY — COLONOSCOPY
Anesthesia: Monitor Anesthesia Care

## 2017-10-02 MED ORDER — PROPOFOL 500 MG/50ML IV EMUL
INTRAVENOUS | Status: DC | PRN
  Administered 2017-10-02: 140 ug/kg/min via INTRAVENOUS

## 2017-10-02 MED ORDER — PROPOFOL 10 MG/ML IV BOLUS
INTRAVENOUS | Status: AC
  Filled 2017-10-02: qty 40

## 2017-10-02 MED ORDER — PROPOFOL 10 MG/ML IV BOLUS
INTRAVENOUS | Status: DC | PRN
  Administered 2017-10-02 (×2): 20 mg via INTRAVENOUS

## 2017-10-02 MED ORDER — LIDOCAINE 2% (20 MG/ML) 5 ML SYRINGE
INTRAMUSCULAR | Status: DC | PRN
  Administered 2017-10-02: 100 mg via INTRAVENOUS

## 2017-10-02 MED ORDER — LIDOCAINE 2% (20 MG/ML) 5 ML SYRINGE
INTRAMUSCULAR | Status: AC
  Filled 2017-10-02: qty 5

## 2017-10-02 MED ORDER — LACTATED RINGERS IV SOLN
INTRAVENOUS | Status: DC
  Administered 2017-10-02: 08:00:00 via INTRAVENOUS

## 2017-10-02 NOTE — Progress Notes (Signed)
Recently back from ENDO via wc accompanied by endo RN. Report called prior to arrival. Attending physician on the floor and in to see pt and wife.

## 2017-10-02 NOTE — Anesthesia Postprocedure Evaluation (Signed)
Anesthesia Post Note  Patient: Charles [name]  Procedure(s) Performed: COLONOSCOPY (N/A )     Patient location during evaluation: PACU Anesthesia Type: MAC Level of consciousness: awake and alert Pain management: pain level controlled Vital Signs Assessment: post-procedure vital signs reviewed and stable Respiratory status: spontaneous breathing, nonlabored ventilation, respiratory function stable and patient connected to nasal cannula oxygen Cardiovascular status: stable and blood pressure returned to baseline Postop Assessment: no apparent nausea or vomiting Anesthetic complications: no    Last Vitals:  Vitals:   10/02/17 0915 10/02/17 0920  BP: (!) 125/52 (!) 147/59  Pulse: (!) 57 64  Resp: 15 16  Temp: 36.8 C   SpO2: 100% 96%    Last Pain:  Vitals:   10/02/17 0915  TempSrc: Oral  PainSc:                  Alyne Martinson S

## 2017-10-02 NOTE — Progress Notes (Signed)
Assessment unchanged. Pt and wife verbalized understanding of dc instructions through teach back including follow up care within week with PCP, when to call the doctor or 911, and medications to resume at home per hospitalist til pt sees PCP. No scripts at dc. Discharged pt via wc to front entrance accompanied by wife and NT.

## 2017-10-02 NOTE — Progress Notes (Signed)
To ENDO via wc accompanied by endo RN.

## 2017-10-02 NOTE — Op Note (Signed)
[facility] Patient Name: Charles [name] Procedure Date: 10/02/2017 MRN: [id number] Attending MD: Estill Cotta. Loletha Carrow , MD Date of Birth: Aug 03, 1931 CSN: [id number] Age: 83 Admit Type: Outpatient Procedure:                Colonoscopy Indications:              Hematochezia, Acute post hemorrhagic anemia                            (incomplete colonoscopy one day prior due to                            inadequate prep) Providers:                Estill Cotta. Loletha Carrow, MD, Elmer Ramp. Tilden Dome, RN, [employer], Technician, Danley Danker, CRNA Referring MD:              Medicines:                Monitored Anesthesia Care Complications:            No immediate complications. Estimated Blood Loss:     Estimated blood loss: none. Procedure:                Pre-Anesthesia Assessment:                           - Prior to the procedure, a History and Physical                            was performed, and patient medications and                            allergies were reviewed. The patient's tolerance of                            previous anesthesia was also reviewed. The risks                            and benefits of the procedure and the sedation                            options and risks were discussed with the patient.                            All questions were answered, and informed consent                            was obtained. Prior Anticoagulants: The patient has                            taken no previous anticoagulant or antiplatelet                            agents.  ASA Grade Assessment: III - A patient with                            severe systemic disease. After reviewing the risks                            and benefits, the patient was deemed in                            satisfactory condition to undergo the procedure.                           After obtaining informed consent, the colonoscope                            was passed under  direct vision. Throughout the                            procedure, the patient's blood pressure, pulse, and                            oxygen saturations were monitored continuously. The                            EC-[id number] ([id number]) scope was introduced through                            the anus and advanced to the the cecum, identified                            by appendiceal orifice and ileocecal valve. The                            colonoscopy was performed without difficulty. The                            patient tolerated the procedure well. The quality                            of the bowel preparation was fair. The ileocecal                            valve, appendiceal orifice, and rectum were                            photographed. The quality of the bowel preparation                            was evaluated using the BBPS [facility] Bowel                            Preparation Scale) with scores of: Right Colon = 1,  Transverse Colon = 2 and Left Colon = 2. The total                            BBPS score equals 5. After lavage. The bowel                            preparation used was MoviPrep. Scope In: 8:46:10 AM Scope Out: 9:07:09 AM Scope Withdrawal Time: 0 hours 14 minutes 33 seconds  Total Procedure Duration: 0 hours 20 minutes 59 seconds  Findings:      The perianal and digital rectal examinations were normal.      Multiple diverticula were found in the left colon and right colon.      A single medium-sized angioectasia without bleeding was found in the       ascending colon.      The mucosa vascular pattern in the distal rectum was locally increased       consistent with radiation proctopathy. There was no stigmata of bleeding.      The exam was otherwise without abnormality on direct and retroflexion       views. Impression:               - Preparation of the colon was fair.                           - Diverticulosis in the left colon  and in the right                            colon.                           - A single non-bleeding colonic angioectasia.                           - Increased mucosa vascular pattern in the rectum.                           - The examination was otherwise normal on direct                            and retroflexion views.                           - No specimens collected.                           I suspect the patient had a self-limited,                            left-sided diverticular bleed rather than bleeding                            from AVMs. See incomplete colonoscopy report of                            yesterday as well. Moderate Sedation:      MAC sedation used Recommendation:           -  Resume regular diet.                           - Continue present medications.                           - No recommendation at this time regarding repeat                            colonoscopy due to age.                           Discharge home today.                           - Return to Dr. Lynne Leader office as needed. Procedure Code(s):        --- Professional ---                           [phone], Colonoscopy, flexible; diagnostic, including                            collection of specimen(s) by brushing or washing,                            when performed (separate procedure) Diagnosis Code(s):        --- Professional ---                           P00.51, Angiodysplasia of colon without hemorrhage                           K92.1, Melena (includes Hematochezia)                           D62, Acute posthemorrhagic anemia                           K57.30, Diverticulosis of large intestine without                            perforation or abscess without bleeding CPT copyright 2016 American Medical Association. All rights reserved. The codes documented in this report are preliminary and upon coder review may  be revised to meet current compliance requirements. Makynleigh Breslin L. Loletha Carrow,  MD 10/02/2017 9:20:11 AM This report has been signed electronically. Number of Addenda: 0

## 2017-10-02 NOTE — Discharge Summary (Signed)
Physician Discharge Summary  Charles [name] GSU:[id number] DOB: [birth year] DOA: 09/29/2017  PCP: Leanna Battles, MD  Admit date: 09/29/2017 Discharge date: 10/02/2017  Time spent: over 30 minutes  Recommendations for Outpatient Follow-up:  1. F/u outpatient CBC/CMP 2. F/u with Dr. Fuller Plan prn   Discharge Diagnoses:  Active Problems:   Lower GI bleeding   Hematochezia   Discharge Condition: stable  Diet recommendation: heart healthy  Filed Weights   09/30/17 0628  Weight: 84 kg (185 lb 3 oz)    History of present illness:  83 y.o.malewith Charles [name] known history of chronic fecal incontinence, diverticulosis, Barrett's, GERDpresents to the emergency department for evaluation of rectal bleeding. Patient was in Charles [name] usual state of health until this afternoon when he discovered Charles [name] large amount of BRBPR upon standing. He has been incontinent of stool since Charles [name] back surgery many years ago. He has also had GI bleeding in the past, follows with Dr. Fuller Plan with last colonoscopy 10 years ago.   Pt had colonoscopy, suspected self limited diverticular bleeding.  Plan for d/c with outpatient f/u as needed.   Hospital Course:  #. Lower GI Bleed with normocytic anemia likely 2/2 diverticulosis -Serial CBCs -D/c'd fish oil and NSAID at d/c.  Restarted ASA.  -Appreciate GI c/s and recs - Continue iron supplementation - EGD and colonoscopy as below.  #. History of RLS - Continue Requip  #. History of BPH - Continue Flomax  #. History of depression - Continue Wellbutrin  #. History of HLD - Continue pravastatin  #. 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 - Diverticulosis in the left colon and in the right colon. - Charles [name] single non-bleeding colonic angioectasia. - Increased mucosa vascular pattern in the rectum. - The examination was otherwise  normal on direct and retroflexion views. - No specimens collected. I suspect the patient had Charles Magloire self-limited, left-sided diverticular bleed rather than bleeding from AVMs. See incomplete colonoscopy report of yesterday as well. Impression: MAC sedation used Moderate Sedation: - Resume regular diet. - Continue present medications. - No recommendation at this time regarding repeat colonoscopy due to age. Discharge home today. - Return to Dr. Lynne Leader office as needed. (i.e. Studies not automatically included, echos, thoracentesis, etc; not x-rays)  Endoscopy - Large hiatal hernia. - Esophageal mucosal changes secondary to established short-segment Barrett's disease. - Tortuous esophagus. - Normal stomach. - Normal examined - Normal examined duodenum. - No specimens collected. MAC sedation used Moderate Sedation: - Clear liquid diet. - Continue present medications.  Colonoscopy Preparation of the colon was poor. - Stool in the entire examined colon. - Diverticulosis in the left colon. Multiple non-bleeding colonic angioectasias. - No specimens collected. MAC sedation used Moderate Sedation: - Clear liquid diet. - Repeat colonoscopy tomorrow with better preparation. - Continue present medications. - See the other procedure note for documentation of additional recommendations.  Consultations:  GI  Discharge Exam: Vitals:   10/02/17 0940 10/02/17 1040  BP: (!) 153/61 (!) 130/56  Pulse: 64 61  Resp: 16 16  Temp:    SpO2: 100% 97%   Feels well.  No complaints.   General: No acute distress. Cardiovascular: Heart sounds show Charles [name] regular rate, and rhythm. No gallops or rubs. No murmurs. No JVD. Lungs: Clear to auscultation bilaterally with good air movement. No rales, rhonchi or wheezes. Abdomen: Soft, nontender, nondistended with normal active bowel sounds.  No masses. No hepatosplenomegaly. Neurological: Alert and oriented 3. Moves all extremities 4 with equal strength.  Cranial nerves II through XII grossly intact. Skin: Warm and dry. No rashes or lesions. Extremities: No clubbing or cyanosis. No edema. R boot and dressing Psychiatric: Mood and affect are normal. Insight and judgment are appropriate.   Discharge Instructions   Discharge Instructions    Call MD for:  difficulty breathing, headache or visual disturbances    Complete by:  As directed    Call MD for:  extreme fatigue    Complete by:  As directed    Call MD for:  persistant dizziness or light-headedness    Complete by:  As directed    Call MD for:  persistant nausea and vomiting    Complete by:  As directed    Call MD for:  redness, tenderness, or signs of infection (pain, swelling, redness, odor or green/yellow discharge around incision site)    Complete by:  As directed    Call MD for:  severe uncontrolled pain    Complete by:  As directed    Call MD for:  temperature >100.4    Complete by:  As directed    Diet - low sodium heart healthy    Complete by:  As directed    Discharge instructions    Complete by:  As directed    You probably had Remee Charley diverticular bleed.  Please follow up with your PCP within the week.  You can follow up with Dr. Fuller Plan as needed.  You can restart your aspirin, but please stop your NSAID and fish oil.  Please follow up with your PCP about restarting these.  Return if you have recurrent or worsening symptoms.   Increase activity slowly    Complete by:  As directed      Discharge Medication List as of 10/02/2017  1:37 PM    CONTINUE these medications which have NOT CHANGED   Details  acetaminophen (TYLENOL) 500 MG tablet Take 1,000 mg by mouth every 6 (six) hours as needed for mild pain. TAKES WITH TRAMADOL, Historical Med    buPROPion (WELLBUTRIN SR) 150 MG 12 hr tablet Take 150 mg by mouth 2 (two) times daily. , Starting 06/28/2011, Until Discontinued, Historical Med    cholecalciferol (VITAMIN D) 1000 UNITS tablet Take 1,000 Units by mouth daily., Until  Discontinued, Historical Med    ferrous sulfate 325 (65 FE) MG EC tablet Take 325 mg by mouth daily with breakfast., Historical Med    gabapentin (NEURONTIN) 300 MG capsule Take 1 tablet PO at 4 PM  and 1 tablet at 10 PM, Normal    ipratropium (ATROVENT) 0.03 % nasal spray Place 2 sprays into both nostrils every 12 (twelve) hours., Historical Med    Leuprolide Acetate, 6 Month, (LUPRON DEPOT, 45-MONTH, IM) Inject into the muscle. NEXT INJECTION IS DUE 02-2017.  GETS AT [facility], Historical Med    lovastatin (MEVACOR) 20 MG tablet Take 20 mg by mouth at bedtime. , Starting Mon 07/22/2012, Historical Med    Magnesium 250 MG TABS Take 250 mg by mouth every evening. , Historical Med    methocarbamol (ROBAXIN) 500 MG tablet Take 500 mg by mouth every 8 (eight) hours as needed for muscle spasms., Until Discontinued, Historical Med    Multiple Minerals-Vitamins (CITRACAL PLUS BONE DENSITY) TABS Take 1 tablet by mouth 2 (two) times daily. , Until Discontinued, Historical Med    Multiple Vitamins-Minerals (CENTRUM SILVER ADULT 50+) TABS Take 1  tablet by mouth daily with breakfast., Until Discontinued, Historical Med    omeprazole (PRILOSEC) 40 MG capsule Take 1 capsule (40 mg total) by mouth 2 (two) times daily., Starting Tue 02/13/2017, Normal    propranolol (INDERAL) 10 MG tablet Take 10 mg by mouth daily. , Until Discontinued, Historical Med    ranitidine (ZANTAC) 300 MG tablet Take 1 tablet (300 mg total) by mouth at bedtime., Starting Tue 02/13/2017, Normal    rOPINIRole (REQUIP) 1 MG tablet Take 2 tablets (2 mg total) by mouth at bedtime., Starting Mon 06/25/2017, Normal    senna (SENOKOT) 8.6 MG TABS tablet Take 1 tablet by mouth daily as needed for mild constipation., Until Discontinued, Historical Med    Tamsulosin HCl (FLOMAX) 0.4 MG CAPS Take 0.4 mg by mouth 2 (two) times daily. , Starting Wed 08/30/2011, Historical Med    traMADol (ULTRAM) 50 MG tablet Take 2 tablets (100 mg  total) by mouth 2 (two) times daily as needed for moderate pain., Starting Mon 05/15/2016, Normal    triamcinolone cream (KENALOG) 0.1 % Apply 1 application topically daily as needed (RASH)., Historical Med    zoledronic acid (RECLAST) 5 MG/100ML SOLN injection Inject into the vein. NEXT IS DUE 03-2017, Historical Med      STOP taking these medications     aspirin EC 81 MG tablet      nabumetone (RELAFEN) 500 MG tablet      Omega-3 Fatty Acids (FISH OIL ULTRA) 1400 MG CAPS        Allergies  Allergen Reactions  . Celebrex [Celecoxib] Other (See Comments)    Created stomach ulcers.  . Cyclobenzaprine Other (See Comments)    Caught in hiatal hernia and caused extreme pain   Follow-up Information    Leanna Battles, MD. Schedule an appointment as soon as possible for Charles [name] visit in 1 week(s).   Specialty:  Internal Medicine Contact information: [place] [address] [phone]            The results of significant diagnostics from this hospitalization (including imaging, microbiology, ancillary and laboratory) are listed below for reference.    Significant Diagnostic Studies: No results found.  Microbiology: No results found for this or any previous visit (from the past 240 hour(s)).   Labs: Basic Metabolic Panel:  Recent Labs Lab 09/29/17 1941 09/30/17 0304 10/01/17 0525  NA 139 137 144  K 4.0 3.8 3.7  CL 108 107 112*  CO2 _0 GLUCOSE 117* 104* 100*  BUN 25* 21* 13  CREATININE 0.99 0.88 0.86  CALCIUM 9.1 8.7* 8.9   Liver Function Tests:  Recent Labs Lab 09/29/17 1941 10/01/17 0525  AST 53* 51*  ALT 23 24  ALKPHOS 76 62  BILITOT 0.4 0.7  PROT 6.8 6.9  ALBUMIN 3.8 3.9   No results for input(s): LIPASE, AMYLASE in the last 168 hours. No results for input(s): AMMONIA in the last 168 hours. CBC:  Recent Labs Lab 09/29/17 1941 09/29/17 2315 09/30/17 0304 09/30/17 0812  10/01/17 0525 10/01/17 1157 10/01/17 1819  10/02/17 0302 10/02/17 1048  WBC 5.4 4.4 7.1 5.2  --   --   --   --   --   --   HGB 11.6* 11.4* 11.7* 11.5*  < > 11.5* 11.6* 11.6* 11.6* 11.1*  HCT 35.0* 34.6* 35.4* 34.4*  --   --   --   --   --   --   MCV 90.7 89.9 90.1 90.1  --   --   --   --   --   --  PLT 177 166 171 170  --   --   --   --   --   --   < > = values in this interval not displayed. Cardiac Enzymes: No results for input(s): CKTOTAL, CKMB, CKMBINDEX, TROPONINI in the last 168 hours. BNP: BNP (last 3 results) No results for input(s): BNP in the last 8760 hours.  ProBNP (last 3 results) No results for input(s): PROBNP in the last 8760 hours.  CBG:  Recent Labs Lab 09/30/17 0822  GLUCAP 93       Signed:  Fayrene Helper MD.  [facility] 10/02/2017, 6:17 PM

## 2017-10-02 NOTE — Progress Notes (Signed)
Patient did not want to wear cpap tonight.

## 2017-10-02 NOTE — Care Management Important Message (Signed)
Important Message  Patient Details  Name: Charles [name] MRN: [id number] Date of Birth: 03-18-1931   Medicare Important Message Given:  Yes    Kerin Salen 10/02/2017, 10:44 AMImportant Message  Patient Details  Name: Charles [name] MRN: [id number] Date of Birth: December 07, 1930   Medicare Important Message Given:  Yes    Kerin Salen 10/02/2017, 10:43 AM

## 2017-10-02 NOTE — Interval H&P Note (Signed)
History and Physical Interval Note:  10/02/2017 8:36 AM  Charles [name]  has presented today for surgery, with the diagnosis of Gastrointestinal bleeding. Inadequate bowel prep with prior [id number]  The various methods of treatment have been discussed with the patient and family. After consideration of risks, benefits and other options for treatment, the patient has consented to  Procedure(s): COLONOSCOPY (N/A) as a surgical intervention .  The patient's history has been reviewed, patient examined, no change in status, stable for surgery.  I have reviewed the patient's chart and labs.  Questions were answered to the patient's satisfaction.     Nelida Meuse III

## 2017-10-02 NOTE — Transfer of Care (Signed)
Immediate Anesthesia Transfer of Care Note  Patient: Charles [name]  Procedure(s) Performed: COLONOSCOPY (N/A )  Patient Location: Endoscopy Unit  Anesthesia Type:MAC  Level of Consciousness: awake  Airway & Oxygen Therapy: Patient Spontanous Breathing and Patient connected to face mask oxygen  Post-op Assessment: Report given to RN and Post -op Vital signs reviewed and stable  Post vital signs: Reviewed and stable  Last Vitals:  Vitals:   10/02/17 0504 10/02/17 0800  BP: 130/66 (!) 155/66  Pulse: 62 72  Resp: 17 20  Temp: 36.5 C 36.8 C  SpO2: 99% 99%    Last Pain:  Vitals:   10/02/17 0800  TempSrc: Oral  PainSc:          Complications: No apparent anesthesia complications

## 2017-10-03 ENCOUNTER — Telehealth: Payer: Self-pay | Admitting: Gastroenterology

## 2017-10-03 ENCOUNTER — Inpatient Hospital Stay (HOSPITAL_COMMUNITY)
Admission: EM | Admit: 2017-10-03 | Discharge: 2017-10-06 | DRG: 378 | Disposition: A | Discharge status: Home Health | Payer: Medicare Other | Attending: Family Medicine | Admitting: Family Medicine

## 2017-10-03 ENCOUNTER — Encounter (HOSPITAL_COMMUNITY): Payer: Self-pay | Admitting: Gastroenterology

## 2017-10-03 DIAGNOSIS — Z888 Allergy status to other drugs, medicaments and biological substances status: Secondary | ICD-10-CM

## 2017-10-03 DIAGNOSIS — K922 Gastrointestinal hemorrhage, unspecified: Secondary | ICD-10-CM | POA: Diagnosis not present

## 2017-10-03 DIAGNOSIS — G250 Essential tremor: Secondary | ICD-10-CM | POA: Diagnosis present

## 2017-10-03 DIAGNOSIS — E785 Hyperlipidemia, unspecified: Secondary | ICD-10-CM | POA: Diagnosis not present

## 2017-10-03 DIAGNOSIS — K625 Hemorrhage of anus and rectum: Secondary | ICD-10-CM | POA: Diagnosis present

## 2017-10-03 DIAGNOSIS — D62 Acute posthemorrhagic anemia: Secondary | ICD-10-CM | POA: Diagnosis not present

## 2017-10-03 DIAGNOSIS — M5442 Lumbago with sciatica, left side: Secondary | ICD-10-CM

## 2017-10-03 DIAGNOSIS — G894 Chronic pain syndrome: Secondary | ICD-10-CM | POA: Diagnosis not present

## 2017-10-03 DIAGNOSIS — I781 Nevus, non-neoplastic: Secondary | ICD-10-CM | POA: Diagnosis present

## 2017-10-03 DIAGNOSIS — Z8546 Personal history of malignant neoplasm of prostate: Secondary | ICD-10-CM

## 2017-10-03 DIAGNOSIS — F329 Major depressive disorder, single episode, unspecified: Secondary | ICD-10-CM | POA: Diagnosis present

## 2017-10-03 DIAGNOSIS — K5520 Angiodysplasia of colon without hemorrhage: Secondary | ICD-10-CM | POA: Diagnosis present

## 2017-10-03 DIAGNOSIS — M419 Scoliosis, unspecified: Secondary | ICD-10-CM | POA: Diagnosis present

## 2017-10-03 DIAGNOSIS — I10 Essential (primary) hypertension: Secondary | ICD-10-CM | POA: Diagnosis present

## 2017-10-03 DIAGNOSIS — K5731 Diverticulosis of large intestine without perforation or abscess with bleeding: Principal | ICD-10-CM | POA: Diagnosis present

## 2017-10-03 DIAGNOSIS — R339 Retention of urine, unspecified: Secondary | ICD-10-CM | POA: Diagnosis present

## 2017-10-03 DIAGNOSIS — G8929 Other chronic pain: Secondary | ICD-10-CM | POA: Diagnosis not present

## 2017-10-03 DIAGNOSIS — Z79899 Other long term (current) drug therapy: Secondary | ICD-10-CM

## 2017-10-03 DIAGNOSIS — G4733 Obstructive sleep apnea (adult) (pediatric): Secondary | ICD-10-CM | POA: Diagnosis present

## 2017-10-03 DIAGNOSIS — Z8249 Family history of ischemic heart disease and other diseases of the circulatory system: Secondary | ICD-10-CM

## 2017-10-03 DIAGNOSIS — M869 Osteomyelitis, unspecified: Secondary | ICD-10-CM | POA: Diagnosis present

## 2017-10-03 DIAGNOSIS — M5441 Lumbago with sciatica, right side: Secondary | ICD-10-CM | POA: Diagnosis not present

## 2017-10-03 DIAGNOSIS — Y842 Radiological procedure and radiotherapy as the cause of abnormal reaction of the patient, or of later complication, without mention of misadventure at the time of the procedure: Secondary | ICD-10-CM | POA: Diagnosis present

## 2017-10-03 DIAGNOSIS — K449 Diaphragmatic hernia without obstruction or gangrene: Secondary | ICD-10-CM | POA: Diagnosis present

## 2017-10-03 DIAGNOSIS — C61 Malignant neoplasm of prostate: Secondary | ICD-10-CM

## 2017-10-03 DIAGNOSIS — K921 Melena: Secondary | ICD-10-CM

## 2017-10-03 DIAGNOSIS — Z8042 Family history of malignant neoplasm of prostate: Secondary | ICD-10-CM

## 2017-10-03 DIAGNOSIS — N401 Enlarged prostate with lower urinary tract symptoms: Secondary | ICD-10-CM | POA: Diagnosis present

## 2017-10-03 DIAGNOSIS — K2270 Barrett's esophagus without dysplasia: Secondary | ICD-10-CM | POA: Diagnosis present

## 2017-10-03 DIAGNOSIS — Z981 Arthrodesis status: Secondary | ICD-10-CM

## 2017-10-03 DIAGNOSIS — Z9889 Other specified postprocedural states: Secondary | ICD-10-CM | POA: Diagnosis not present

## 2017-10-03 DIAGNOSIS — I2510 Atherosclerotic heart disease of native coronary artery without angina pectoris: Secondary | ICD-10-CM | POA: Diagnosis present

## 2017-10-03 DIAGNOSIS — M1990 Unspecified osteoarthritis, unspecified site: Secondary | ICD-10-CM | POA: Diagnosis present

## 2017-10-03 DIAGNOSIS — F339 Major depressive disorder, recurrent, unspecified: Secondary | ICD-10-CM | POA: Diagnosis present

## 2017-10-03 DIAGNOSIS — Z923 Personal history of irradiation: Secondary | ICD-10-CM

## 2017-10-03 DIAGNOSIS — K219 Gastro-esophageal reflux disease without esophagitis: Secondary | ICD-10-CM | POA: Diagnosis present

## 2017-10-03 DIAGNOSIS — Z89421 Acquired absence of other right toe(s): Secondary | ICD-10-CM

## 2017-10-03 DIAGNOSIS — E559 Vitamin D deficiency, unspecified: Secondary | ICD-10-CM | POA: Diagnosis present

## 2017-10-03 DIAGNOSIS — M810 Age-related osteoporosis without current pathological fracture: Secondary | ICD-10-CM | POA: Diagnosis present

## 2017-10-03 DIAGNOSIS — Z9989 Dependence on other enabling machines and devices: Secondary | ICD-10-CM

## 2017-10-03 LAB — CBC
HEMATOCRIT: 34.3 % — AB (ref 39.0–52.0)
HEMOGLOBIN: 11.2 g/dL — AB (ref 13.0–17.0)
MCH: 29.4 pg (ref 26.0–34.0)
MCHC: 32.7 g/dL (ref 30.0–36.0)
MCV: 90 fL (ref 78.0–100.0)
Platelets: 169 10*3/uL (ref 150–400)
RBC: 3.81 MIL/uL — AB (ref 4.22–5.81)
RDW: 15 % (ref 11.5–15.5)
WBC: 6.2 10*3/uL (ref 4.0–10.5)

## 2017-10-03 LAB — ABO/RH: ABO/RH(D): O NEG

## 2017-10-03 LAB — BASIC METABOLIC PANEL
ANION GAP: 8 (ref 5–15)
BUN: 13 mg/dL (ref 6–20)
CALCIUM: 9.2 mg/dL (ref 8.9–10.3)
CO2: 23 mmol/L (ref 22–32)
Chloride: 109 mmol/L (ref 101–111)
Creatinine, Ser: 0.84 mg/dL (ref 0.61–1.24)
GLUCOSE: 93 mg/dL (ref 65–99)
POTASSIUM: 3.8 mmol/L (ref 3.5–5.1)
SODIUM: 140 mmol/L (ref 135–145)

## 2017-10-03 LAB — TYPE AND SCREEN
ABO/RH(D): O NEG
ANTIBODY SCREEN: NEGATIVE

## 2017-10-03 LAB — PROTIME-INR
INR: 0.95
PROTHROMBIN TIME: 12.6 s (ref 11.4–15.2)

## 2017-10-03 NOTE — H&P (Signed)
[facility] History and Physical  Charles [name] ZOX:[id number] DOB: 1931/10/07 DOA: 10/03/2017  Referring physician: Dr Ellender Hose PCP: Leanna Battles, MD   Chief Complaint: Rectal bleeding  HPI: CHIVAS NOTZ is a 83 y.o. male with hx of DJD, AVM's, BPH, CAD, depression, hx radiation (prostate ca), HL, prostate Ca, OSA, urinary retention , back surgery (1996,  2014, 2017).  Pt was just admitted as described below. Had w/u and was dc'd home yesterday.  Then today had some blood clots noted in the shower and returned to ED.  Hb 11 here, no active bleeding. ED called GI , they will see in am.  Asked to admit.   Was just admitted Oct 27- 30 for rectal bleeding.  Patient had colonscopy which showed >>  - diverticulosis in the left colon and in the right colon - A single non-bleeding colonic angioectasia. - Increased mucosa vascular pattern in the rectum. Pt had EGD which showed >>  - Large hiatal hernia. - Esophageal mucosal changes secondary to established short-segment Barrett's disease. - Tortuous esophagus. - Normal stomach. - Normal examined duodenum. GI suspected diverticular bleeding.  At dc they dc'd his his fish oil and nsaids, ok to take ASA.  Cont Fe supp.     Patient denies any abd pain, hematemesis, no dysuria, no SOB, CP or fevers.  No diarrhea.   Old chart shows admit in May 2014 for scoliosis/ kyphoscoliosis underwent Right TLIF L2-3, L3-4 and L4-5, Removal of hardware L5-S1, Segmental fixation L2 to S1, Interbody cages x3, local bone graft. Repair of dural tear of L4-5 right on 04/08/2013.  In JUn 2017 admitted for chronic lumbar radiculopathy, underwent decompression and fusion L2-5. Postoperative urinary retention.     ROS  denies CP  no joint pain   no HA  no blurry vision  no rash  no diarrhea  no nausea/ vomiting  no dysuria  no difficulty voiding  no change in urine color    Past Medical History  Past Medical History:  Diagnosis Date  .  Arthritis   . AVM (arteriovenous malformation)   . Barrett's esophagus   . BPH (benign prostatic hyperplasia)   . CAD (coronary artery disease)    only on stress test; never had MI, PCI or CABG.  In 2008, LVEF was reportedly normal.   . Cataract   . Constipation   . CPAP (continuous positive airway pressure) dependence   . Depression   . Diverticulosis   . Diverticulosis   . GERD (gastroesophageal reflux disease)   . History of radiation therapy   . Hyperlipidemia   . Hypoglycemia   . Lymphocytosis 06/2012  . Mastodynia   . Murmur   . Nasal congestion   . Osteoporosis   . Osteoporosis   . Prostate cancer ([place])   . Sleep apnea    wears CPAP  . Urinary retention with incomplete bladder emptying    pt uses bathroom and within 30 mins needs to go again  . Vitamin D deficiency    Past Surgical History  Past Surgical History:  Procedure Laterality Date  . AMPUTATION TOE Right 09/20/2017   Procedure: Right 4th toe amputation;  Surgeon: Wylene Simmer, MD;  Location: [facility];  Service: Orthopedics;  Laterality: Right;  foot block  . BACK SURGERY  04/2013  . back surgury     f  . BLEPHAROPLASTY Bilateral 02/23/15  . CATARACT EXTRACTION    . COLONOSCOPY    . COLONOSCOPY N/A 10/02/2017  Procedure: COLONOSCOPY;  Surgeon: Doran Stabler, MD;  Location: [facility];  Service: Gastroenterology;  Laterality: N/A;  . COLONOSCOPY WITH PROPOFOL N/A 10/01/2017   Procedure: COLONOSCOPY WITH PROPOFOL;  Surgeon: Doran Stabler, MD;  Location: [facility];  Service: Gastroenterology;  Laterality: N/A;  . ESOPHAGOGASTRODUODENOSCOPY (EGD) WITH PROPOFOL N/A 10/01/2017   Procedure: ESOPHAGOGASTRODUODENOSCOPY (EGD) WITH PROPOFOL;  Surgeon: Doran Stabler, MD;  Location: [facility];  Service: Gastroenterology;  Laterality: N/A;  . EYE SURGERY Bilateral   . FINGER SURGERY Right    right thumb  . HARDWARE REMOVAL Right 05/15/2016   Procedure: Right Lumbar Two-Lumbar  Five Removal of Hardware;  Surgeon: Kristeen Miss, MD;  Location: [facility];  Service: Neurosurgery;  Laterality: Right;  Right L2-5 removal of hardware  . INGUINAL HERNIA REPAIR     x4  . REPAIR SPIGELIAN HERNIA  2011  . SPINAL FUSION  1996  . TONSILLECTOMY    . UPPER GASTROINTESTINAL ENDOSCOPY    . VASECTOMY     Family History  Family History  Problem Relation Age of Onset  . Prostate cancer Father   . CAD Mother   . Arthritis-Osteo Brother    Social History  reports that he has never smoked. He has never used smokeless tobacco. He reports that he does not drink alcohol or use drugs. Allergies  Allergies  Allergen Reactions  . Celebrex [Celecoxib] Other (See Comments)    Created stomach ulcers.  . Cyclobenzaprine Other (See Comments)    Caught in hiatal hernia and caused extreme pain   Home medications Prior to Admission medications   Medication Sig Start Date End Date Taking? Authorizing Provider  buPROPion (WELLBUTRIN SR) 150 MG 12 hr tablet Take 150 mg by mouth 2 (two) times daily.  06/28/11  Yes [provider]  cholecalciferol (VITAMIN D) 1000 UNITS tablet Take 1,000 Units by mouth daily.   Yes [provider]  ferrous sulfate 325 (65 FE) MG EC tablet Take 325 mg by mouth daily with breakfast.   Yes [provider]  gabapentin (NEURONTIN) 300 MG capsule Take 1 tablet PO at 4 PM  and 1 tablet at 10 PM Patient taking differently: Take 300 mg by mouth 3 (three) times daily. Take 1 tablet PO at 8am, 2 at 4 PM  and 2 tablet at 10 PM 06/20/16  Yes Millikan, Megan, NP  ipratropium (ATROVENT) 0.03 % nasal spray Place 2 sprays into both nostrils every 12 (twelve) hours.   Yes [provider]  lovastatin (MEVACOR) 20 MG tablet Take 20 mg by mouth at bedtime.  07/22/12  Yes [provider]  Magnesium 250 MG TABS Take 250 mg by mouth every evening.    Yes [provider]  Multiple Minerals-Vitamins (CITRACAL PLUS BONE DENSITY) TABS  Take 1 tablet by mouth 2 (two) times daily.    Yes [provider]  Multiple Vitamins-Minerals (CENTRUM SILVER ADULT 50+) TABS Take 1 tablet by mouth daily with breakfast.   Yes [provider]  omeprazole (PRILOSEC) 40 MG capsule Take 1 capsule (40 mg total) by mouth 2 (two) times daily. 02/13/17  Yes [name], MD  propranolol (INDERAL) 10 MG tablet Take 10 mg by mouth daily.    Yes [provider]  ranitidine (ZANTAC) 300 MG tablet Take 1 tablet (300 mg total) by mouth at bedtime. 02/13/17  Yes [name], MD  rOPINIRole (REQUIP) 1 MG tablet Take 2 tablets (2 mg total) by mouth  at bedtime. 06/25/17  Yes Dohmeier, Asencion Partridge, MD  Tamsulosin HCl (FLOMAX) 0.4 MG CAPS Take 0.4 mg by mouth 2 (two) times daily.  08/30/11  Yes [provider]  acetaminophen (TYLENOL) 500 MG tablet Take 1,000 mg by mouth every 6 (six) hours as needed for mild pain. TAKES WITH TRAMADOL    [provider]  Leuprolide Acetate, 6 Month, (LUPRON DEPOT, 65-MONTH, IM) Inject into the muscle. NEXT INJECTION IS DUE 02-2017.  GETS AT [facility]    [provider]  methocarbamol (ROBAXIN) 500 MG tablet Take 500 mg by mouth every 8 (eight) hours as needed for muscle spasms.    [provider]  senna (SENOKOT) 8.6 MG TABS tablet Take 1 tablet by mouth daily as needed for mild constipation.    [provider]  traMADol (ULTRAM) 50 MG tablet Take 2 tablets (100 mg total) by mouth 2 (two) times daily as needed for moderate pain. 05/15/16   Kristeen Miss, MD  triamcinolone cream (KENALOG) 0.1 % Apply 1 application topically daily as needed (RASH).    [provider]  zoledronic acid (RECLAST) 5 MG/100ML SOLN injection Inject into the vein. NEXT IS DUE 03-2017    [provider]   Liver Function Tests  Recent Labs Lab 09/29/17 1941 10/01/17 0525  AST 53* 51*  ALT 23 24  ALKPHOS 76 62  BILITOT 0.4 0.7  PROT 6.8 6.9  ALBUMIN 3.8 3.9    No results for input(s): LIPASE, AMYLASE in the last 168 hours. CBC  Recent Labs Lab 09/30/17 0304 09/30/17 0812  10/02/17 0302 10/02/17 1048 10/03/17 2038  WBC 7.1 5.2  --   --   --  6.2  HGB 11.7* 11.5*  < > 11.6* 11.1* 11.2*  HCT 35.4* 34.4*  --   --   --  34.3*  MCV 90.1 90.1  --   --   --  90.0  PLT 171 170  --   --   --  169  < > = values in this interval not displayed. Basic Metabolic Panel  Recent Labs Lab 09/29/17 1941 09/30/17 0304 10/01/17 0525 10/03/17 2130  NA 139 137 144 140  K 4.0 3.8 3.7 3.8  CL 108 107 112* 109  CO2 23 24 23 23   GLUCOSE 117* 104* 100* 93  BUN 25* 21* 13 13  CREATININE 0.99 0.88 0.86 0.84  CALCIUM 9.1 8.7* 8.9 9.2     Vitals:   10/03/17 1556 10/03/17 1938 10/03/17 2141  BP: (!) 115/47 117/74 134/85  Pulse: (!) 55 61 60  Resp: 18 18 18   Temp: 98.9 F (37.2 C)    TempSrc: Oral    SpO2: 99% 97% 97%   Exam: Gen elderly WF, no distress No rash, cyanosis or gangrene Sclera anicteric, throat clear  No jvd or bruits Chest clear bilat RRR w/ 2/6 SEM, no RG Abd soft ntnd no mass or ascites +bs GU normal male MS no joint effusions or deformity Ext 1+ bilat pretib edema / no wounds or ulcers Neuro is alert, Ox 3 , nf    Home meds: -wellbutrin 150 bid / requip 2mg  hs/ robaxin prn/ ultram 100mg  bid prn/ neurontin 300 tid -atrovent nasal/ lovastatin 20 mg/ MVI/ prilosec 40/ zantac 300 hs/ flomax -propranolol 10 mg qd -lupron injection every 6 mos per urology/ zoledronic acid   Na 140  K 3.8  CO2 23  BUN 13  Cr 0.84  Ca 9.2  WBC 6k  Hb 11  plt 169  INR 0.95  Assessment: 1. Rectal bleeding - recurrent issue, was just here and had colonscopy/ EGD w/o active bleed . GI felt had tic bleed. DC'd yest. Not active bleeding, Hb 11.  Asked to admit, GI called and will see in am.  Get CBC in am.  No lovenox, will use SCD.  2. Hx mult back surgeries 3. Hx prostate Ca 4. Depression 5. HTN - on inderal only 6. HL   Plan - as  above       [place] D [facility] Pager [phone]   If 7PM-7AM, please contact night-coverage [email or web address] Password [email or web address] 10/03/2017, 11:17 PM

## 2017-10-03 NOTE — ED Triage Notes (Addendum)
Per pt, states he was just discharged from hospital for the same symptoms yesterday-states he took a shower last night and notices some bloody clots in shower-states he had 2 colonoscopies while he was hospitalized-states first one was not successful-second one was negative-states small amount of blood in stool this am

## 2017-10-03 NOTE — ED Provider Notes (Signed)
[facility] Provider Note   CSN: [id number] Arrival date & time: 10/03/17  1519     History   Chief Complaint Chief Complaint  Patient presents with  . Rectal Bleeding    HPI Charles [name] is a 83 y.o. male.  HPI   83 year old male with past medical history as below who presents with rectal bleeding.  The patient was just admitted and discharged yesterday following admission for lower GI bleeding.  He had a small telangiectasia and diverticulosis but no evidence of active bleed on and colonoscopy.  He was sent home.  Since then, he has passed multiple dark red clots and had bright red blood per rectum.  He endorses generalized fatigue.  He is not on blood thinners.  He called his GI physician, Dr. Fuller Plan, who advised returning to the ED.  Currently, he has mild left lower quadrant abdominal pain.  He is otherwise without complaints.  No fevers or chills.  No chest pain or shortness of breath.  There is no syncope.  Past Medical History:  Diagnosis Date  . Arthritis   . AVM (arteriovenous malformation)   . Barrett's esophagus   . BPH (benign prostatic hyperplasia)   . CAD (coronary artery disease)    only on stress test; never had MI, PCI or CABG.  In 2008, LVEF was reportedly normal.   . Cataract   . Constipation   . CPAP (continuous positive airway pressure) dependence   . Depression   . Diverticulosis   . Diverticulosis   . GERD (gastroesophageal reflux disease)   . History of radiation therapy   . Hyperlipidemia   . Hypoglycemia   . Lymphocytosis 06/2012  . Mastodynia   . Murmur   . Nasal congestion   . Osteoporosis   . Osteoporosis   . Prostate cancer ([place])   . Sleep apnea    wears CPAP  . Urinary retention with incomplete bladder emptying    pt uses bathroom and within 30 mins needs to go again  . Vitamin D deficiency     Patient Active Problem List   Diagnosis Date Noted  . History of back surgery  10/03/2017  . Rectal bleeding 10/03/2017  . Hematochezia   . Lower GI bleeding 09/29/2017  . Treatment-emergent central sleep apnea 06/25/2017  . Chronic bilateral low back pain with bilateral sciatica 01/17/2017  . Post laminectomy syndrome 01/17/2017  . Chronic pain syndrome 01/17/2017  . Sacroiliitis ([place]) 01/17/2017  . Piriformis syndrome of right side 01/17/2017  . Malignant neoplasm of prostate ([place]) 07/19/2016  . Fixation hardware in spine 05/15/2016  . Hypoglycemic reaction 02/02/2015  . Tremor, essential 12/11/2014  . OSA on CPAP 04/24/2014  . 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 Hypersomnia with sleep apnea, unspecified 04/24/2014  . Depression 04/18/2013  . BPH (benign prostatic hyperplasia) 04/18/2013  . Impingement syndrome of right shoulder 04/14/2013    Class: Acute  . Scoliosis or kyphoscoliosis 04/08/2013    Class: Chronic  . Lumbosacral spondylosis without myelopathy 04/08/2013    Class: Chronic  . Acute respiratory failure ([place]) 04/08/2013  . Essential hypertension 04/08/2013  . Lymphocytosis   . HIATAL HERNIA 11/21/2010  . DIVERTICULAR DISEASE 11/21/2010  . BARRETT'S ESOPHAGUS 02/22/2010  . GERD 07/20/2008  . INGUINAL HERNIA, LEFT, SMALL 07/20/2008  . OTH VENTRAL HERN W/O MENTION OBSTRUCTION/GANGREN 07/20/2008  . CONSTIPATION 07/20/2008  . DYSPHAGIA 07/20/2008    Past Surgical History:  Procedure Laterality Date  . AMPUTATION  TOE Right 09/20/2017   Procedure: Right 4th toe amputation;  Surgeon: Wylene Simmer, MD;  Location: [facility];  Service: Orthopedics;  Laterality: Right;  foot block  . BACK SURGERY  04/2013  . back surgury     f  . BLEPHAROPLASTY Bilateral 02/23/15  . CATARACT EXTRACTION    . COLONOSCOPY    . COLONOSCOPY N/A 10/02/2017   Procedure: COLONOSCOPY;  Surgeon: Doran Stabler, MD;  Location: [facility];  Service: Gastroenterology;  Laterality: N/A;  . COLONOSCOPY WITH PROPOFOL N/A 10/01/2017   Procedure:  COLONOSCOPY WITH PROPOFOL;  Surgeon: Doran Stabler, MD;  Location: [facility];  Service: Gastroenterology;  Laterality: N/A;  . ESOPHAGOGASTRODUODENOSCOPY (EGD) WITH PROPOFOL N/A 10/01/2017   Procedure: ESOPHAGOGASTRODUODENOSCOPY (EGD) WITH PROPOFOL;  Surgeon: Doran Stabler, MD;  Location: [facility];  Service: Gastroenterology;  Laterality: N/A;  . EYE SURGERY Bilateral   . FINGER SURGERY Right    right thumb  . HARDWARE REMOVAL Right 05/15/2016   Procedure: Right Lumbar Two-Lumbar Five Removal of Hardware;  Surgeon: Kristeen Miss, MD;  Location: [facility];  Service: Neurosurgery;  Laterality: Right;  Right L2-5 removal of hardware  . INGUINAL HERNIA REPAIR     x4  . REPAIR SPIGELIAN HERNIA  2011  . SPINAL FUSION  1996  . TONSILLECTOMY    . UPPER GASTROINTESTINAL ENDOSCOPY    . VASECTOMY         Home Medications    Prior to Admission medications   Medication Sig Start Date End Date Taking? Authorizing Provider  buPROPion (WELLBUTRIN SR) 150 MG 12 hr tablet Take 150 mg by mouth 2 (two) times daily.  06/28/11  Yes [provider]  cholecalciferol (VITAMIN D) 1000 UNITS tablet Take 1,000 Units by mouth daily.   Yes [provider]  ferrous sulfate 325 (65 FE) MG EC tablet Take 325 mg by mouth daily with breakfast.   Yes [provider]  gabapentin (NEURONTIN) 300 MG capsule Take 1 tablet PO at 4 PM  and 1 tablet at 10 PM Patient taking differently: Take 300 mg by mouth 3 (three) times daily. Take 1 tablet PO at 8am, 2 at 4 PM  and 2 tablet at 10 PM 06/20/16  Yes Millikan, Megan, NP  ipratropium (ATROVENT) 0.03 % nasal spray Place 2 sprays into both nostrils every 12 (twelve) hours.   Yes [provider]  lovastatin (MEVACOR) 20 MG tablet Take 20 mg by mouth at bedtime.  07/22/12  Yes [provider]  Magnesium 250 MG TABS Take 250 mg by mouth every evening.    Yes [provider]  Multiple Minerals-Vitamins (CITRACAL PLUS  BONE DENSITY) TABS Take 1 tablet by mouth 2 (two) times daily.    Yes [provider]  Multiple Vitamins-Minerals (CENTRUM SILVER ADULT 50+) TABS Take 1 tablet by mouth daily with breakfast.   Yes [provider]  omeprazole (PRILOSEC) 40 MG capsule Take 1 capsule (40 mg total) by mouth 2 (two) times daily. 02/13/17  Yes [name], MD  propranolol (INDERAL) 10 MG tablet Take 10 mg by mouth daily.    Yes [provider]  ranitidine (ZANTAC) 300 MG tablet Take 1 tablet (300 mg total) by mouth at bedtime. 02/13/17  Yes [name], MD  rOPINIRole (REQUIP) 1 MG tablet Take 2 tablets (2 mg total) by mouth at bedtime. 06/25/17  Yes Dohmeier, Asencion Partridge, MD  Tamsulosin HCl (FLOMAX) 0.4 MG CAPS Take 0.4 mg  by mouth 2 (two) times daily.  08/30/11  Yes [provider]  acetaminophen (TYLENOL) 500 MG tablet Take 1,000 mg by mouth every 6 (six) hours as needed for mild pain. TAKES WITH TRAMADOL    [provider]  Leuprolide Acetate, 6 Month, (LUPRON DEPOT, 62-MONTH, IM) Inject into the muscle. NEXT INJECTION IS DUE 02-2017.  GETS AT [facility]    [provider]  methocarbamol (ROBAXIN) 500 MG tablet Take 500 mg by mouth every 8 (eight) hours as needed for muscle spasms.    [provider]  senna (SENOKOT) 8.6 MG TABS tablet Take 1 tablet by mouth daily as needed for mild constipation.    [provider]  traMADol (ULTRAM) 50 MG tablet Take 2 tablets (100 mg total) by mouth 2 (two) times daily as needed for moderate pain. 05/15/16   Kristeen Miss, MD  triamcinolone cream (KENALOG) 0.1 % Apply 1 application topically daily as needed (RASH).    [provider]  zoledronic acid (RECLAST) 5 MG/100ML SOLN injection Inject into the vein. NEXT IS DUE 03-2017    [provider]    Family History Family History  Problem Relation Age of Onset  . Prostate cancer Father   . CAD Mother   . Arthritis-Osteo Brother      Social History Social History  Substance Use Topics  . Smoking status: Never Smoker  . Smokeless tobacco: Never Used  . Alcohol use No     Allergies   Celebrex [celecoxib] and Cyclobenzaprine   Review of Systems Review of Systems  Constitutional: Positive for fatigue.  Gastrointestinal: Positive for abdominal pain, blood in stool and nausea.  All other systems reviewed and are negative.    Physical Exam Updated Vital Signs BP (!) 138/52 (BP Location: Left Arm)   Pulse 62   Temp 98.4 F (36.9 C) (Oral)   Resp 18   SpO2 100%   Physical Exam  Constitutional: He is oriented to person, place, and time. He appears well-developed and well-nourished. No distress.  HENT:  Head: Normocephalic and atraumatic.  Eyes: Conjunctivae are normal.  Neck: Neck supple.  Cardiovascular: Normal rate, regular rhythm and normal heart sounds.  Exam reveals no friction rub.   No murmur heard. Pulmonary/Chest: Effort normal and breath sounds normal. No respiratory distress. He has no wheezes. He has no rales.  Abdominal: Soft. He exhibits no distension.  Genitourinary:  Genitourinary Comments: Grossly bloody stool noted, with maroon colored clots.  No external bleeding.  No hemorrhoids.  Musculoskeletal: He exhibits no edema.  Neurological: He is alert and oriented to person, place, and time. He exhibits normal muscle tone.  Skin: Skin is warm. Capillary refill takes less than 2 seconds.  Psychiatric: He has a normal mood and affect.  Nursing note and vitals reviewed.    ED Treatments / Results  Labs (all labs ordered are listed, but only abnormal results are displayed) Labs Reviewed  CBC - Abnormal; Notable for the following:       Result Value   RBC 3.81 (*)    Hemoglobin 11.2 (*)    HCT 34.3 (*)    All other components within normal limits  PROTIME-INR  BASIC METABOLIC PANEL  CBC  TYPE AND SCREEN  ABO/RH    EKG  EKG Interpretation None       Radiology No  results found.  Procedures Procedures (including critical care time)  Medications Ordered in ED Medications  rOPINIRole (REQUIP) tablet 2 mg (2 mg Oral  Given 10/04/17 0120)  pantoprazole (PROTONIX) EC tablet 40 mg (not administered)  famotidine (PEPCID) tablet 10 mg (10 mg Oral Given 10/04/17 0119)  acetaminophen (TYLENOL) tablet 1,000 mg (not administered)  ferrous sulfate tablet 325 mg (not administered)  gabapentin (NEURONTIN) capsule 300 mg (300 mg Oral Given 10/04/17 0119)  senna (SENOKOT) tablet 8.6 mg (not administered)  traMADol (ULTRAM) tablet 100 mg (not administered)  methocarbamol (ROBAXIN) tablet 500 mg (not administered)  magnesium oxide (MAG-OX) tablet 200 mg (not administered)  calcium-vitamin D (OSCAL WITH D) 500-200 MG-UNIT per tablet 1 tablet (not administered)  multivitamin with minerals tablet 1 tablet (not administered)  propranolol (INDERAL) tablet 10 mg (not administered)  cholecalciferol (VITAMIN D) tablet 1,000 Units (not administered)  pravastatin (PRAVACHOL) tablet 20 mg (not administered)  buPROPion (WELLBUTRIN SR) 12 hr tablet 150 mg (not administered)  tamsulosin (FLOMAX) capsule 0.4 mg (0.4 mg Oral Given 10/04/17 0118)  sodium chloride flush (NS) 0.9 % injection 3 mL (not administered)  sodium chloride flush (NS) 0.9 % injection 3 mL (not administered)  0.9 %  sodium chloride infusion (not administered)  ondansetron (ZOFRAN) tablet 4 mg (not administered)    Or  ondansetron (ZOFRAN) injection 4 mg (not administered)     Initial Impression / Assessment and Plan / ED Course  I have reviewed the triage vital signs and the nursing notes.  Pertinent labs & imaging results that were available during my care of the patient were reviewed by me and considered in my medical decision making (see chart for details).     83 year old male with past medical history as above here with ongoing rectal bleeding.  On exam, he has obvious rectal bleeding with bloody  stool, but no external bleeding.  Discussed case with Dr. Carlean Purl of GI.  He recommend admission and serial CBCs with repeat evaluation in the morning by GI.  Patient has been added to the list.  He is otherwise stable.  Type and screen sent.  He is not on blood thinners.  Final Clinical Impressions(s) / ED Diagnoses   Final diagnoses:  Lower GI bleed    New Prescriptions Current Discharge Medication List       Duffy Bruce, MD 10/04/17 0131

## 2017-10-03 NOTE — Telephone Encounter (Signed)
Patient and wife notified of the recommendations.  Patient just had another Bm and had more bright red bleeding and clots. They verbalized understanding to return to ED for evaluation.

## 2017-10-03 NOTE — Telephone Encounter (Signed)
Patient was admitted over the weekend with a GI bleed.  He had one angioctasia on the right side. Was a suspected diverticular bleeding per Dr. Loletha Carrow and d/c summary.  He was discharged home yesterday.  He has had 2 episodes of bright red bleeding since discharge.  Once last night and once this am.  He is gassy this am.  Patient denies dixzziness, SOB or CP.  Please advise wife and patient are very concerned about continued bleeding.

## 2017-10-03 NOTE — Telephone Encounter (Signed)
Return to ED for evaluation, possible readmission if bleeding persists.

## 2017-10-04 DIAGNOSIS — Z8546 Personal history of malignant neoplasm of prostate: Secondary | ICD-10-CM | POA: Diagnosis not present

## 2017-10-04 DIAGNOSIS — M810 Age-related osteoporosis without current pathological fracture: Secondary | ICD-10-CM | POA: Diagnosis present

## 2017-10-04 DIAGNOSIS — C61 Malignant neoplasm of prostate: Secondary | ICD-10-CM | POA: Diagnosis not present

## 2017-10-04 DIAGNOSIS — K625 Hemorrhage of anus and rectum: Secondary | ICD-10-CM | POA: Diagnosis not present

## 2017-10-04 DIAGNOSIS — F329 Major depressive disorder, single episode, unspecified: Secondary | ICD-10-CM | POA: Diagnosis present

## 2017-10-04 DIAGNOSIS — K5731 Diverticulosis of large intestine without perforation or abscess with bleeding: Principal | ICD-10-CM

## 2017-10-04 DIAGNOSIS — D62 Acute posthemorrhagic anemia: Secondary | ICD-10-CM | POA: Diagnosis not present

## 2017-10-04 DIAGNOSIS — M5441 Lumbago with sciatica, right side: Secondary | ICD-10-CM | POA: Diagnosis not present

## 2017-10-04 DIAGNOSIS — G8929 Other chronic pain: Secondary | ICD-10-CM | POA: Diagnosis not present

## 2017-10-04 DIAGNOSIS — E559 Vitamin D deficiency, unspecified: Secondary | ICD-10-CM | POA: Diagnosis present

## 2017-10-04 DIAGNOSIS — K627 Radiation proctitis: Secondary | ICD-10-CM

## 2017-10-04 DIAGNOSIS — I781 Nevus, non-neoplastic: Secondary | ICD-10-CM | POA: Diagnosis present

## 2017-10-04 DIAGNOSIS — I10 Essential (primary) hypertension: Secondary | ICD-10-CM | POA: Diagnosis not present

## 2017-10-04 DIAGNOSIS — K5521 Angiodysplasia of colon with hemorrhage: Secondary | ICD-10-CM

## 2017-10-04 DIAGNOSIS — N401 Enlarged prostate with lower urinary tract symptoms: Secondary | ICD-10-CM | POA: Diagnosis present

## 2017-10-04 DIAGNOSIS — K922 Gastrointestinal hemorrhage, unspecified: Secondary | ICD-10-CM | POA: Diagnosis not present

## 2017-10-04 DIAGNOSIS — G250 Essential tremor: Secondary | ICD-10-CM | POA: Diagnosis present

## 2017-10-04 DIAGNOSIS — Z981 Arthrodesis status: Secondary | ICD-10-CM | POA: Diagnosis not present

## 2017-10-04 DIAGNOSIS — M1990 Unspecified osteoarthritis, unspecified site: Secondary | ICD-10-CM | POA: Diagnosis present

## 2017-10-04 DIAGNOSIS — Y842 Radiological procedure and radiotherapy as the cause of abnormal reaction of the patient, or of later complication, without mention of misadventure at the time of the procedure: Secondary | ICD-10-CM | POA: Diagnosis present

## 2017-10-04 DIAGNOSIS — G4733 Obstructive sleep apnea (adult) (pediatric): Secondary | ICD-10-CM | POA: Diagnosis not present

## 2017-10-04 DIAGNOSIS — K219 Gastro-esophageal reflux disease without esophagitis: Secondary | ICD-10-CM | POA: Diagnosis present

## 2017-10-04 DIAGNOSIS — I2510 Atherosclerotic heart disease of native coronary artery without angina pectoris: Secondary | ICD-10-CM | POA: Diagnosis present

## 2017-10-04 DIAGNOSIS — K5520 Angiodysplasia of colon without hemorrhage: Secondary | ICD-10-CM | POA: Diagnosis present

## 2017-10-04 DIAGNOSIS — E785 Hyperlipidemia, unspecified: Secondary | ICD-10-CM | POA: Diagnosis present

## 2017-10-04 DIAGNOSIS — G894 Chronic pain syndrome: Secondary | ICD-10-CM | POA: Diagnosis not present

## 2017-10-04 DIAGNOSIS — M5442 Lumbago with sciatica, left side: Secondary | ICD-10-CM | POA: Diagnosis not present

## 2017-10-04 DIAGNOSIS — K2270 Barrett's esophagus without dysplasia: Secondary | ICD-10-CM | POA: Diagnosis present

## 2017-10-04 DIAGNOSIS — Z9989 Dependence on other enabling machines and devices: Secondary | ICD-10-CM | POA: Diagnosis not present

## 2017-10-04 DIAGNOSIS — M869 Osteomyelitis, unspecified: Secondary | ICD-10-CM | POA: Diagnosis present

## 2017-10-04 DIAGNOSIS — M419 Scoliosis, unspecified: Secondary | ICD-10-CM | POA: Diagnosis present

## 2017-10-04 DIAGNOSIS — K449 Diaphragmatic hernia without obstruction or gangrene: Secondary | ICD-10-CM | POA: Diagnosis present

## 2017-10-04 DIAGNOSIS — R710 Precipitous drop in hematocrit: Secondary | ICD-10-CM | POA: Diagnosis not present

## 2017-10-04 DIAGNOSIS — R339 Retention of urine, unspecified: Secondary | ICD-10-CM | POA: Diagnosis present

## 2017-10-04 DIAGNOSIS — K921 Melena: Secondary | ICD-10-CM | POA: Diagnosis not present

## 2017-10-04 DIAGNOSIS — Z89421 Acquired absence of other right toe(s): Secondary | ICD-10-CM | POA: Diagnosis not present

## 2017-10-04 LAB — CBC
HCT: 32 % — ABNORMAL LOW (ref 39.0–52.0)
Hemoglobin: 10.5 g/dL — ABNORMAL LOW (ref 13.0–17.0)
MCH: 29.6 pg (ref 26.0–34.0)
MCHC: 32.8 g/dL (ref 30.0–36.0)
MCV: 90.1 fL (ref 78.0–100.0)
PLATELETS: 154 10*3/uL (ref 150–400)
RBC: 3.55 MIL/uL — ABNORMAL LOW (ref 4.22–5.81)
RDW: 14.8 % (ref 11.5–15.5)
WBC: 6.2 10*3/uL (ref 4.0–10.5)

## 2017-10-04 MED ORDER — ADULT MULTIVITAMIN W/MINERALS CH
1.0000 | ORAL_TABLET | Freq: Every day | ORAL | Status: DC
  Administered 2017-10-04 – 2017-10-06 (×3): 1 via ORAL
  Filled 2017-10-04 (×3): qty 1

## 2017-10-04 MED ORDER — FERROUS SULFATE 325 (65 FE) MG PO TABS
325.0000 mg | ORAL_TABLET | Freq: Every day | ORAL | Status: DC
  Administered 2017-10-04 – 2017-10-06 (×3): 325 mg via ORAL
  Filled 2017-10-04 (×3): qty 1

## 2017-10-04 MED ORDER — SODIUM CHLORIDE 0.9% FLUSH
3.0000 mL | Freq: Two times a day (BID) | INTRAVENOUS | Status: DC
  Administered 2017-10-04 – 2017-10-06 (×6): 3 mL via INTRAVENOUS

## 2017-10-04 MED ORDER — PANTOPRAZOLE SODIUM 40 MG PO TBEC
40.0000 mg | DELAYED_RELEASE_TABLET | Freq: Every day | ORAL | Status: DC
  Administered 2017-10-04 – 2017-10-06 (×3): 40 mg via ORAL
  Filled 2017-10-04 (×3): qty 1

## 2017-10-04 MED ORDER — FAMOTIDINE 20 MG PO TABS
10.0000 mg | ORAL_TABLET | Freq: Every day | ORAL | Status: DC
  Administered 2017-10-04 – 2017-10-05 (×3): 10 mg via ORAL
  Filled 2017-10-04 (×3): qty 1

## 2017-10-04 MED ORDER — PRAVASTATIN SODIUM 20 MG PO TABS
20.0000 mg | ORAL_TABLET | Freq: Every day | ORAL | Status: DC
  Administered 2017-10-04 – 2017-10-05 (×2): 20 mg via ORAL
  Filled 2017-10-04 (×2): qty 1

## 2017-10-04 MED ORDER — PROPRANOLOL HCL 10 MG PO TABS
10.0000 mg | ORAL_TABLET | Freq: Every day | ORAL | Status: DC
  Administered 2017-10-04 – 2017-10-06 (×3): 10 mg via ORAL
  Filled 2017-10-04 (×3): qty 1

## 2017-10-04 MED ORDER — METHOCARBAMOL 500 MG PO TABS
500.0000 mg | ORAL_TABLET | Freq: Three times a day (TID) | ORAL | Status: DC | PRN
  Administered 2017-10-05 – 2017-10-06 (×2): 500 mg via ORAL
  Filled 2017-10-04 (×2): qty 1

## 2017-10-04 MED ORDER — GABAPENTIN 300 MG PO CAPS
300.0000 mg | ORAL_CAPSULE | Freq: Three times a day (TID) | ORAL | Status: DC
  Administered 2017-10-04 – 2017-10-06 (×8): 300 mg via ORAL
  Filled 2017-10-04 (×8): qty 1

## 2017-10-04 MED ORDER — ONDANSETRON HCL 4 MG PO TABS: 4.0000 mg | ORAL_TABLET | Freq: Four times a day (QID) | ORAL | Status: DC | PRN

## 2017-10-04 MED ORDER — ACETAMINOPHEN 500 MG PO TABS
1000.0000 mg | ORAL_TABLET | Freq: Four times a day (QID) | ORAL | Status: DC | PRN
  Administered 2017-10-04: 1000 mg via ORAL
  Filled 2017-10-04: qty 2

## 2017-10-04 MED ORDER — SENNA 8.6 MG PO TABS: 1.0000 | ORAL_TABLET | Freq: Every day | ORAL | Status: DC | PRN

## 2017-10-04 MED ORDER — MAGNESIUM OXIDE 400 (241.3 MG) MG PO TABS
200.0000 mg | ORAL_TABLET | Freq: Every evening | ORAL | Status: DC
  Administered 2017-10-04 – 2017-10-05 (×2): 200 mg via ORAL
  Filled 2017-10-04 (×2): qty 1

## 2017-10-04 MED ORDER — ROPINIROLE HCL 1 MG PO TABS
2.0000 mg | ORAL_TABLET | Freq: Every day | ORAL | Status: DC
  Administered 2017-10-04 – 2017-10-05 (×3): 2 mg via ORAL
  Filled 2017-10-04 (×3): qty 2

## 2017-10-04 MED ORDER — SODIUM CHLORIDE 0.9% FLUSH: 3.0000 mL | INTRAVENOUS | Status: DC | PRN

## 2017-10-04 MED ORDER — VITAMIN D 1000 UNITS PO TABS
1000.0000 [IU] | ORAL_TABLET | Freq: Every day | ORAL | Status: DC
  Administered 2017-10-04 – 2017-10-06 (×3): 1000 [IU] via ORAL
  Filled 2017-10-04 (×3): qty 1

## 2017-10-04 MED ORDER — CALCIUM CARBONATE-VITAMIN D 500-200 MG-UNIT PO TABS
1.0000 | ORAL_TABLET | Freq: Two times a day (BID) | ORAL | Status: DC
  Administered 2017-10-04 – 2017-10-06 (×5): 1 via ORAL
  Filled 2017-10-04 (×5): qty 1

## 2017-10-04 MED ORDER — BUPROPION HCL ER (SR) 150 MG PO TB12
150.0000 mg | ORAL_TABLET | Freq: Two times a day (BID) | ORAL | Status: DC
  Administered 2017-10-04 – 2017-10-06 (×5): 150 mg via ORAL
  Filled 2017-10-04 (×5): qty 1

## 2017-10-04 MED ORDER — TAMSULOSIN HCL 0.4 MG PO CAPS
0.4000 mg | ORAL_CAPSULE | Freq: Two times a day (BID) | ORAL | Status: DC
  Administered 2017-10-04 – 2017-10-06 (×6): 0.4 mg via ORAL
  Filled 2017-10-04 (×6): qty 1

## 2017-10-04 MED ORDER — TRAMADOL HCL 50 MG PO TABS
100.0000 mg | ORAL_TABLET | Freq: Two times a day (BID) | ORAL | Status: DC | PRN
  Administered 2017-10-04 – 2017-10-06 (×2): 100 mg via ORAL
  Filled 2017-10-04 (×3): qty 2

## 2017-10-04 MED ORDER — SODIUM CHLORIDE 0.9 % IV SOLN: 250.0000 mL | INTRAVENOUS | Status: DC | PRN

## 2017-10-04 MED ORDER — ONDANSETRON HCL 4 MG/2ML IJ SOLN: 4.0000 mg | Freq: Four times a day (QID) | INTRAMUSCULAR | Status: DC | PRN

## 2017-10-04 MED ORDER — PROPRANOLOL HCL 20 MG PO TABS: 10.0000 mg | ORAL_TABLET | Freq: Every day | ORAL | Status: DC

## 2017-10-04 NOTE — Consult Note (Signed)
Referring Provider:  Dr. Jonnie Finner Primary Care Physician:  Leanna Battles, MD Primary Gastroenterologist:  Dr. Fuller Plan  Reason for Consultation:  GI bleed  HPI: Charles [name] is a 83 y.o. male with hx of DJD, colonic AVM's, BPH, CAD, depression, HL, prostate CA a/p radiation, OSA, urinary retention, back surgery (1996, 2014, 2017).  Pt was just admitted for GI bleeding and was discharged on 10/30, less than 24 hours before presenting back to the ED on this occasion.  Patient had recurrent bleeding at home.  Had some blood clots noted in the shower and then several more times before returning to ED.  Hgb stable at 11.2 grams at the time of admission, but down slightly at 10.5 grams this AM.  No further bleeding since coming to the ED.  Was just admitted Oct 27- 30 for rectal bleeding.  Patient had colonscopy which showed >>  - diverticulosis in the left colon and in the right colon - A single non-bleeding colonic angioectasia in the ascending colon - Increased mucosa vascular pattern in the rectum c/w radiation changes. Pt had EGD which showed >>  - Large hiatal hernia. - Esophageal mucosal changes secondary to established short-segment Barrett's disease. - Tortuous esophagus. - Normal stomach. - Normal examined duodenum. GI suspected diverticular bleeding.  At discharge Charles [name] discontinued his fish oil and NSAIDs.  We had given the ok for him to take ASA 81 mg daily.  Cont Fe supp.     Patient denies any abd pain, hematemesis, no dysuria, no SOB, CP or fevers.  No diarrhea.    Past Medical History:  Diagnosis Date  . Arthritis   . AVM (arteriovenous malformation)   . Barrett's esophagus   . BPH (benign prostatic hyperplasia)   . CAD (coronary artery disease)    only on stress test; never had MI, PCI or CABG.  In 2008, LVEF was reportedly normal.   . Cataract   . Constipation   . CPAP (continuous positive airway pressure) dependence   . Depression   . Diverticulosis   .  Diverticulosis   . GERD (gastroesophageal reflux disease)   . History of radiation therapy   . Hyperlipidemia   . Hypoglycemia   . Lymphocytosis 06/2012  . Mastodynia   . Murmur   . Nasal congestion   . Osteoporosis   . Osteoporosis   . Prostate cancer ([place])   . Sleep apnea    wears CPAP  . Urinary retention with incomplete bladder emptying    pt uses bathroom and within 30 mins needs to go again  . Vitamin D deficiency     Past Surgical History:  Procedure Laterality Date  . AMPUTATION TOE Right 09/20/2017   Procedure: Right 4th toe amputation;  Surgeon: Wylene Simmer, MD;  Location: [facility];  Service: Orthopedics;  Laterality: Right;  foot block  . BACK SURGERY  04/2013  . back surgury     f  . BLEPHAROPLASTY Bilateral 02/23/15  . CATARACT EXTRACTION    . COLONOSCOPY    . COLONOSCOPY N/A 10/02/2017   Procedure: COLONOSCOPY;  Surgeon: Doran Stabler, MD;  Location: [facility];  Service: Gastroenterology;  Laterality: N/A;  . COLONOSCOPY WITH PROPOFOL N/A 10/01/2017   Procedure: COLONOSCOPY WITH PROPOFOL;  Surgeon: Doran Stabler, MD;  Location: [facility];  Service: Gastroenterology;  Laterality: N/A;  . ESOPHAGOGASTRODUODENOSCOPY (EGD) WITH PROPOFOL N/A 10/01/2017   Procedure: ESOPHAGOGASTRODUODENOSCOPY (EGD) WITH PROPOFOL;  Surgeon: Doran Stabler, MD;  Location: [facility];  Service: Gastroenterology;  Laterality: N/A;  . EYE SURGERY Bilateral   . FINGER SURGERY Right    right thumb  . HARDWARE REMOVAL Right 05/15/2016   Procedure: Right Lumbar Two-Lumbar Five Removal of Hardware;  Surgeon: Kristeen Miss, MD;  Location: [facility];  Service: Neurosurgery;  Laterality: Right;  Right L2-5 removal of hardware  . INGUINAL HERNIA REPAIR     x4  . REPAIR SPIGELIAN HERNIA  2011  . SPINAL FUSION  1996  . TONSILLECTOMY    . UPPER GASTROINTESTINAL ENDOSCOPY    . VASECTOMY      Prior to Admission medications   Medication Sig Start Date End  Date Taking? Authorizing Provider  buPROPion (WELLBUTRIN SR) 150 MG 12 hr tablet Take 150 mg by mouth 2 (two) times daily.  06/28/11  Yes [provider]  cholecalciferol (VITAMIN D) 1000 UNITS tablet Take 1,000 Units by mouth daily.   Yes [provider]  ferrous sulfate 325 (65 FE) MG EC tablet Take 325 mg by mouth daily with breakfast.   Yes [provider]  gabapentin (NEURONTIN) 300 MG capsule Take 1 tablet PO at 4 PM  and 1 tablet at 10 PM Patient taking differently: Take 300 mg by mouth 3 (three) times daily. Take 1 tablet PO at 8am, 2 at 4 PM  and 2 tablet at 10 PM 06/20/16  Yes Millikan, Megan, NP  ipratropium (ATROVENT) 0.03 % nasal spray Place 2 sprays into both nostrils every 12 (twelve) hours.   Yes [provider]  lovastatin (MEVACOR) 20 MG tablet Take 20 mg by mouth at bedtime.  07/22/12  Yes [provider]  Magnesium 250 MG TABS Take 250 mg by mouth every evening.    Yes [provider]  Multiple Minerals-Vitamins (CITRACAL PLUS BONE DENSITY) TABS Take 1 tablet by mouth 2 (two) times daily.    Yes [provider]  Multiple Vitamins-Minerals (CENTRUM SILVER ADULT 50+) TABS Take 1 tablet by mouth daily with breakfast.   Yes [provider]  omeprazole (PRILOSEC) 40 MG capsule Take 1 capsule (40 mg total) by mouth 2 (two) times daily. 02/13/17  Yes [name], MD  propranolol (INDERAL) 10 MG tablet Take 10 mg by mouth daily.    Yes [provider]  ranitidine (ZANTAC) 300 MG tablet Take 1 tablet (300 mg total) by mouth at bedtime. 02/13/17  Yes [name], MD  rOPINIRole (REQUIP) 1 MG tablet Take 2 tablets (2 mg total) by mouth at bedtime. 06/25/17  Yes Dohmeier, Asencion Partridge, MD  Tamsulosin HCl (FLOMAX) 0.4 MG CAPS Take 0.4 mg by mouth 2 (two) times daily.  08/30/11  Yes [provider]  acetaminophen (TYLENOL) 500 MG tablet Take 1,000 mg by mouth every 6 (six) hours as needed for mild pain. TAKES  WITH TRAMADOL    [provider]  Leuprolide Acetate, 6 Month, (LUPRON DEPOT, 24-MONTH, IM) Inject into the muscle. NEXT INJECTION IS DUE 02-2017.  GETS AT [facility]    [provider]  methocarbamol (ROBAXIN) 500 MG tablet Take 500 mg by mouth every 8 (eight) hours as needed for muscle spasms.    [provider]  senna (SENOKOT) 8.6 MG TABS tablet Take 1 tablet by mouth daily as needed for mild constipation.    [provider]  traMADol (ULTRAM) 50 MG tablet Take 2 tablets (100 mg total) by mouth 2 (two) times daily as needed for moderate pain. 05/15/16   Elsner,  Mallie Mussel, MD  triamcinolone cream (KENALOG) 0.1 % Apply 1 application topically daily as needed (RASH).    [provider]  zoledronic acid (RECLAST) 5 MG/100ML SOLN injection Inject into the vein. NEXT IS DUE 03-2017    [provider]    Current Facility-Administered Medications  Medication Dose Route Frequency Provider Last Rate Last Dose  . 0.9 %  sodium chloride infusion  250 mL Intravenous PRN Roney Jaffe, MD      . acetaminophen (TYLENOL) tablet 1,000 mg  1,000 mg Oral Q6H PRN Roney Jaffe, MD      . buPROPion [facility] SR) 12 hr tablet 150 mg  150 mg Oral BID Roney Jaffe, MD      . calcium-vitamin D (OSCAL WITH D) 500-200 MG-UNIT per tablet 1 tablet  1 tablet Oral BID Roney Jaffe, MD      . cholecalciferol (VITAMIN D) tablet 1,000 Units  1,000 Units Oral Daily Roney Jaffe, MD      . famotidine (PEPCID) tablet 10 mg  10 mg Oral QHS Roney Jaffe, MD   10 mg at 10/04/17 0119  . ferrous sulfate tablet 325 mg  325 mg Oral Q breakfast Roney Jaffe, MD   325 mg at 10/04/17 0826  . gabapentin (NEURONTIN) capsule 300 mg  300 mg Oral TID Roney Jaffe, MD   300 mg at 10/04/17 0119  . magnesium oxide (MAG-OX) tablet 200 mg  200 mg Oral QPM Roney Jaffe, MD      . methocarbamol (ROBAXIN) tablet 500 mg  500 mg Oral Q8H PRN Roney Jaffe, MD      .  multivitamin with minerals tablet 1 tablet  1 tablet Oral Q breakfast Roney Jaffe, MD   1 tablet at 10/04/17 [phone]  . ondansetron (ZOFRAN) tablet 4 mg  4 mg Oral Q6H PRN Roney Jaffe, MD       Or  . ondansetron (ZOFRAN) injection 4 mg  4 mg Intravenous Q6H PRN Roney Jaffe, MD      . pantoprazole (PROTONIX) EC tablet 40 mg  40 mg Oral Daily Roney Jaffe, MD      . pravastatin (PRAVACHOL) tablet 20 mg  20 mg Oral q1800 Roney Jaffe, MD      . propranolol (INDERAL) tablet 10 mg  10 mg Oral Daily Roney Jaffe, MD      . rOPINIRole (REQUIP) tablet 2 mg  2 mg Oral QHS Roney Jaffe, MD   2 mg at 10/04/17 0120  . senna (SENOKOT) tablet 8.6 mg  1 tablet Oral Daily PRN Roney Jaffe, MD      . sodium chloride flush (NS) 0.9 % injection 3 mL  3 mL Intravenous Q12H Roney Jaffe, MD   3 mL at 10/04/17 0157  . sodium chloride flush (NS) 0.9 % injection 3 mL  3 mL Intravenous PRN Roney Jaffe, MD      . tamsulosin [facility]) capsule 0.4 mg  0.4 mg Oral BID Roney Jaffe, MD   0.4 mg at 10/04/17 0118  . traMADol (ULTRAM) tablet 100 mg  100 mg Oral BID PRN Roney Jaffe, MD        Allergies as of 10/03/2017 - Review Complete 10/03/2017  Allergen Reaction Noted  . Celebrex [celecoxib] Other (See Comments) 04/23/2012  . Cyclobenzaprine Other (See Comments) 09/07/2015    Family History  Problem Relation Age of Onset  . Prostate cancer Father   . CAD Mother   . Arthritis-Osteo Brother     Social History   Social History  .  Marital status: Married    Spouse name: Charles [name]  . Number of children: 3  . Years of education: College   Occupational History  . Retired Retired    retired Arboriculturist   Social History Main Topics  . Smoking status: Never Smoker  . Smokeless tobacco: Never Used  . Alcohol use No  . Drug use: No  . Sexual activity: No   Other Topics Concern  . Not on file   Social History Narrative   Patient is married Charles [name]).   Patient is a retired  Arboriculturist.   Patient lives in [place] living facility.   Patient has three adult children.   Patient does not drink any caffeine.   Patient is right-handed.    Review of Systems: ROS is negative except as mentioned in HPI.  Physical Exam: Vital signs in last 24 hours: Temp:  [97.9 F (36.6 C)-98.9 F (37.2 C)] 97.9 F (36.6 C) (11/01 0630) Pulse Rate:  [55-63] 56 (11/01 0630) Resp:  [18] 18 (11/01 0630) BP: (105-138)/(47-96) 130/50 (11/01 0630) SpO2:  [93 %-100 %] 100 % (11/01 0630) Weight:  [182 lb 8.7 oz (82.8 kg)] 182 lb 8.7 oz (82.8 kg) (11/01 0115) Last BM Date: 10/03/17 General:  Alert, Well-developed, well-nourished, pleasant and cooperative in NAD Head:  Normocephalic and atraumatic. Eyes:  Sclera clear, no icterus.  Conjunctiva pink. Ears:  Normal auditory acuity. Mouth:  No deformity or lesions.   Lungs:  Clear throughout to auscultation.  No wheezes, crackles, or rhonchi.  Heart:  Slightly bradycardic, but regular rhythm; no murmurs, clicks, rubs, or gallops. Abdomen:  Soft, non-distended, BS present.  Non-tender. Rectal:  Not performed. Msk:  Symmetrical without gross deformities. Pulses:  Normal pulses noted. Extremities:  Without clubbing or edema. Neurologic:  Alert and oriented x 4;  grossly normal neurologically. Skin:  Intact without significant lesions or rashes. Psych:  Alert and cooperative. Normal mood and affect.  Intake/Output from previous day: 10/31 0701 - 11/01 0700 In: 240 [P.O.:240] Out: -  Intake/Output this shift: Total I/O In: 120 [P.O.:120] Out: -   Lab Results:  Recent Labs  10/02/17 1048 10/03/17 2038 10/04/17 0454  WBC  --  6.2 6.2  HGB 11.1* 11.2* 10.5*  HCT  --  34.3* 32.0*  PLT  --  169 154   BMET  Recent Labs  10/03/17 2130  NA 140  K 3.8  CL 109  CO2 23  GLUCOSE 93  BUN 13  CREATININE 0.84  CALCIUM 9.2   PT/INR  Recent Labs  10/03/17 2038  LABPROT 12.6  INR 0.95   IMPRESSION:  *LGIB:   Suspected to be diverticular in origin after colonoscopy earlier this week.  Also had a medium sized AVM in the ascending colon and radiation changes in rectum-both of those were non-bleeding at the time of procedure, but ? If the radiation changes could be the source. *Blood loss anemia:  Hgb down less than one gram from the time of his discharge 2 days ago.    PLAN: *Continue to monitor for now and transfuse prn. *Further or repeat testing for recurrent bleeding per Dr. Henrene Pastor. *Will allow soft diet for now.  ZEHR, JESSICA D.  10/04/2017, 9:22 AM  Pager number [phone]  GI ATTENDING  History, laboratories, x-rays, recent endoscopy reports reviewed. Patient personally seen and examined. Agree with above comprehensive consultation note as outlined. Patient is a suboptimal historian and colorblind. However, apparently had recurrence of small volume rectal bleeding. Slight drop in hemoglobin. Previous  acute GI bleed felt secondary to diverticular disease. 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 Agree with supportive measures and monitoring bowel movements at this point.  Docia Chuck. Geri Seminole., M.D. [facility] Division of Gastroenterology

## 2017-10-04 NOTE — Progress Notes (Signed)
Pt states that he will self administer CPAP when ready for bed.  RT to monitor and assess as needed.  

## 2017-10-04 NOTE — Plan of Care (Signed)
Problem: Safety: Goal: Ability to remain free from injury will improve Outcome: Progressing No safety issues noted  Problem: Pain Managment: Goal: General experience of comfort will improve Outcome: Progressing No pain issues  Problem: Bowel/Gastric: Goal: Will not experience complications related to bowel motility Outcome: Progressing Denies bowel issues

## 2017-10-04 NOTE — Care Management Obs Status (Signed)
[facility] NOTIFICATION   Patient Details  Name: Charles [name] MRN: [id number] Date of Birth: 1931-02-15   Medicare Observation Status Notification Given:  Yes    Guadalupe Maple, RN 10/04/2017, 3:28 PM

## 2017-10-04 NOTE — Progress Notes (Addendum)
PROGRESS NOTE    Charles [name]  UYQ:[id number] DOB: [birth year] DOA: 10/03/2017 PCP: Leanna Battles, MD   Brief Narrative:  83 y.o. male with hx of DJD, AVM's, BPH, CAD, depression, hx radiation (prostate ca), HL, prostate Ca, OSA, urinary retention , back surgery (1996,  2014, 2017), recently discharged on 10/30 after admission for rectal bleeding when he had upper and lower GI endoscopy presented with not improvement in rectal bleeding.  Assessment & Plan:   Principal Problem:   Hematochezia Active Problems:   Essential hypertension   Depression   Malignant neoplasm of prostate ([facility])   Chronic bilateral low back pain with bilateral sciatica   Chronic pain syndrome   Lower GI bleeding   History of back surgery   Rectal bleeding Acute blood loss anemia  - Recent upper and lower GI endoscopy showed diverticulosis in the left colon which could be the source of bleeding. Patient has a drop in hemoglobin by about 1 g. He has no bowel movement since he came to hospital. No nausea vomiting or abdominal pain. GI was consulted, follow-up recommendation. Continue current home medications. Repeat CBC in the morning.  Plan discussed with the patient and his wife at bedside.  DVT prophylaxis: SCD and ambulation Code Status: Full code Family Communication: Patient's wife at bedside Disposition Plan: Currently admitted. Dispo as per GI    Consultants:   GI  Procedures: None Antimicrobials: None  Subjective: Seen and examined at bedside. Concern about GI bleed at home. No bowel movement in the hospital. Denied headache, dizziness, nausea vomiting chest pain or shortness of breath. Wife at bedside.  Objective: Vitals:   10/04/17 0046 10/04/17 0115 10/04/17 0630 10/04/17 0939  BP: (!) 138/52  (!) 130/50 (!) 107/56  Pulse: 62  (!) 56 (!) 58  Resp: 18  18   Temp: 98.4 F (36.9 C)  97.9 F (36.6 C) 97.7 F (36.5 C)  TempSrc: Oral  Oral Oral  SpO2: 100%  100% 96%  Weight:   82.8 kg (182 lb 8.7 oz)    Height:  5\' 6"  (1.676 m)      Intake/Output Summary (Last 24 hours) at 10/04/17 1156 Last data filed at 10/04/17 0944  Gross per 24 hour  Intake              360 ml  Output                0 ml  Net              360 ml   Filed Weights   10/04/17 0115  Weight: 82.8 kg (182 lb 8.7 oz)    Examination:  General exam: Appears calm and comfortable  Respiratory system: Clear to auscultation. Respiratory effort normal. No wheezing or crackle Cardiovascular system: S1 & S2 heard, RRR.  No pedal edema. Gastrointestinal system: Abdomen is nondistended, soft and nontender. Normal bowel sounds heard. Central nervous system: Alert and oriented. No focal neurological deficits. Skin: No rashes, lesions or ulcers Psychiatry: Judgement and insight appear normal. Mood & affect appropriate.     Data Reviewed: I have personally reviewed following labs and imaging studies  CBC:  Recent Labs Lab 09/29/17 2315 09/30/17 0304 09/30/17 6387  10/01/17 1819 10/02/17 0302 10/02/17 1048 10/03/17 2038 10/04/17 0454  WBC 4.4 7.1 5.2  --   --   --   --  6.2 6.2  HGB 11.4* 11.7* 11.5*  < > 11.6* 11.6* 11.1* 11.2* 10.5*  HCT 34.6* 35.4* 34.4*  --   --   --   --  34.3* 32.0*  MCV 89.9 90.1 90.1  --   --   --   --  90.0 90.1  PLT 166 171 170  --   --   --   --  169 154  < > = values in this interval not displayed. Basic Metabolic Panel:  Recent Labs Lab 09/29/17 1941 09/30/17 0304 10/01/17 0525 10/03/17 2130  NA 139 137 144 140  K 4.0 3.8 3.7 3.8  CL 108 107 112* 109  CO2 23 24 23 23   GLUCOSE 117* 104* 100* 93  BUN 25* 21* 13 13  CREATININE 0.99 0.88 0.86 0.84  CALCIUM 9.1 8.7* 8.9 9.2   GFR: Estimated Creatinine Clearance: 66.1 mL/min (by C-G formula based on SCr of 0.84 mg/dL). Liver Function Tests:  Recent Labs Lab 09/29/17 1941 10/01/17 0525  AST 53* 51*  ALT 23 24  ALKPHOS 76 62  BILITOT 0.4 0.7  PROT 6.8 6.9  ALBUMIN 3.8 3.9   No results for  input(s): LIPASE, AMYLASE in the last 168 hours. No results for input(s): AMMONIA in the last 168 hours. Coagulation Profile:  Recent Labs Lab 09/29/17 1941 10/03/17 2038  INR 1.07 0.95   Cardiac Enzymes: No results for input(s): CKTOTAL, CKMB, CKMBINDEX, TROPONINI in the last 168 hours. BNP (last 3 results) No results for input(s): PROBNP in the last 8760 hours. HbA1C: No results for input(s): HGBA1C in the last 72 hours. CBG:  Recent Labs Lab 09/30/17 0822  GLUCAP 93   Lipid Profile: No results for input(s): CHOL, HDL, LDLCALC, TRIG, CHOLHDL, LDLDIRECT in the last 72 hours. Thyroid Function Tests: No results for input(s): TSH, T4TOTAL, FREET4, T3FREE, THYROIDAB in the last 72 hours. Anemia Panel: No results for input(s): VITAMINB12, FOLATE, FERRITIN, TIBC, IRON, RETICCTPCT in the last 72 hours. Sepsis Labs: No results for input(s): PROCALCITON, LATICACIDVEN in the last 168 hours.  No results found for this or any previous visit (from the past 240 hour(s)).       Radiology Studies: No results found.      Scheduled Meds: . buPROPion  150 mg Oral BID  . calcium-vitamin D  1 tablet Oral BID  . cholecalciferol  1,000 Units Oral Daily  . famotidine  10 mg Oral QHS  . ferrous sulfate  325 mg Oral Q breakfast  . gabapentin  300 mg Oral TID  . magnesium oxide  200 mg Oral QPM  . multivitamin with minerals  1 tablet Oral Q breakfast  . pantoprazole  40 mg Oral Daily  . pravastatin  20 mg Oral q1800  . propranolol  10 mg Oral Daily  . rOPINIRole  2 mg Oral QHS  . sodium chloride flush  3 mL Intravenous Q12H  . tamsulosin  0.4 mg Oral BID   Continuous Infusions: . sodium chloride       LOS: 0 days    Marshe Shrestha Tanna Furry, MD [facility] Pager [phone]  If 7PM-7AM, please contact night-coverage [email or web address] Password [email or web address] 10/04/2017, 11:56 AM

## 2017-10-05 DIAGNOSIS — D62 Acute posthemorrhagic anemia: Secondary | ICD-10-CM

## 2017-10-05 DIAGNOSIS — K5731 Diverticulosis of large intestine without perforation or abscess with bleeding: Secondary | ICD-10-CM

## 2017-10-05 LAB — CBC
HCT: 32.6 % — ABNORMAL LOW (ref 39.0–52.0)
Hemoglobin: 10.7 g/dL — ABNORMAL LOW (ref 13.0–17.0)
MCH: 29.7 pg (ref 26.0–34.0)
MCHC: 32.8 g/dL (ref 30.0–36.0)
MCV: 90.6 fL (ref 78.0–100.0)
PLATELETS: 148 10*3/uL — AB (ref 150–400)
RBC: 3.6 MIL/uL — AB (ref 4.22–5.81)
RDW: 15.1 % (ref 11.5–15.5)
WBC: 5.5 10*3/uL (ref 4.0–10.5)

## 2017-10-05 NOTE — Progress Notes (Signed)
Pt had a smear of bm and when he was wiped, there was a very small amount of bright red blood.

## 2017-10-05 NOTE — Progress Notes (Signed)
     [facility] Progress Note  CC:   LGIB  Subjective:  No further bleeding since admission.  Has been walking around the room and intends to walk in the halls later today.  Says that he does not have a way to get home today.  His only complaint currently is of left shoulder pain.  Objective:  Vital signs in last 24 hours: Temp:  [97.7 F (36.5 C)-99 F (37.2 C)] 99 F (37.2 C) (11/02 0615) Pulse Rate:  [58-116] 116 (11/02 0615) Resp:  [18-20] 20 (11/02 0615) BP: (107-133)/(50-62) 124/62 (11/02 0615) SpO2:  [96 %-99 %] 99 % (11/02 0615) Last BM Date: 10/03/17 General:  Alert, Well-developed, in NAD Heart:  Tachy but regular rhythm.  Murmur noted. Pulm:  CTAB.  No increased WOB. Abdomen:  Soft, non-distended.  BS present.  Non-tender. Extremities:  Without edema. Neurologic:  Alert and oriented x 4;  grossly normal neurologically. Psych:  Alert and cooperative. Normal mood and affect.  Intake/Output from previous day: 11/01 0701 - 11/02 0700 In: 600 [P.O.:600] Out: 225 [Urine:225] Intake/Output this shift: Total I/O In: 560 [P.O.:560] Out: -   Lab Results:  Recent Labs  10/03/17 2038 10/04/17 0454 10/05/17 0533  WBC 6.2 6.2 5.5  HGB 11.2* 10.5* 10.7*  HCT 34.3* 32.0* 32.6*  PLT 169 154 148*   BMET  Recent Labs  10/03/17 2130  NA 140  K 3.8  CL 109  CO2 23  GLUCOSE 93  BUN 13  CREATININE 0.84  CALCIUM 9.2   PT/INR  Recent Labs  10/03/17 2038  LABPROT 12.6  INR 0.95   Assessment / Plan: *LGIB:  Suspected to be diverticular in origin after colonoscopy earlier this week.  Also had a medium sized AVM in the ascending colon and radiation changes in rectum-both of those were non-bleeding at the time of procedure, but ? If the radiation changes could be the source (doubt that right sided AVM was the source).  No further bleeding since admission. *Blood loss anemia:  Hgb stable.  -Continue to monitor for now.  He says that he does not  have a ride to get home today and I think that it would not be a bad idea to just keep him until tomorrow morning at which time he can be discharged if no further bleeding. -Continue soft diet.   LOS: 1 day   ZEHR, JESSICA D.  10/05/2017, 9:37 AM  Pager number [phone]  GI ATTENDING  Interval history data reviewed. Patient seen and examined. Agree with interval progress note. No further bleeding. Okay to go home from GI standpoint. Will sign off.  Docia Chuck. Geri Seminole., M.D. [facility] Division of Gastroenterology

## 2017-10-05 NOTE — Evaluation (Signed)
Occupational Therapy Evaluation Patient Details Name: Charles [name] MRN: [id number] DOB: Oct 27, 1931 Today's Date: 10/05/2017    History of Present Illness 83 yo male admitted with GI bleed. Onsert of L shoulder blade area pain during hospital stay. Hx of R 4th toe amp 09/2017, chronic fecal incontinence, multiple back surgeries, Barrett's esophagus, sleep apnea, osteoporosis, CAD   Clinical Impression   Pt was admitted for the above.  He was limited with adls and mobility by L shoulder blade pain.  He will benefit from continued OT to increase independence with adls.  Goals in acute are for min A. He needs up to max A at this time    Follow Up Recommendations  SNF (pt declining.) Home health OT;Supervision/Assistance - 24 hour  Also recommend [place] aide if pt returns to independent living)    Equipment Recommendations  None recommended by OT    Recommendations for Other Services       Precautions / Restrictions Precautions Precautions: Fall Restrictions Weight Bearing Restrictions: No Other Position/Activity Restrictions: RLE WBAT with post op shoe      Mobility Bed Mobility Overal bed mobility: Needs Assistance Bed Mobility: Supine to Sit;Sit to Supine     Supine to sit: Mod assist;HOB elevated Sit to supine: Mod assist;HOB elevated   General bed mobility comments: assist for trunk getting up and legs for back to bed. Also helped him reposition. Increased time.   Transfers Overall transfer level: Needs assistance Equipment used: Rolling walker (2 wheeled) Transfers: Sit to/from Stand Sit to Stand: Min assist         General transfer comment: assist to rise and steady    Balance Overall balance assessment: Needs assistance         Standing balance support: Bilateral upper extremity supported Standing balance-Leahy Scale: Poor                             ADL either performed or assessed with clinical judgement   ADL Overall ADL's : Needs  assistance/impaired Eating/Feeding: Minimal assistance;Sitting Eating/Feeding Details (indicate cue type and reason): set up and repositioning Grooming: Moderate assistance;Sitting   Upper Body Bathing: Moderate assistance;Sitting   Lower Body Bathing: Moderate assistance;Sit to/from stand   Upper Body Dressing : Minimal assistance;Sitting   Lower Body Dressing: Maximal assistance;Sit to/from stand   Toilet Transfer: Minimal assistance;Ambulation;RW             General ADL Comments: pt states he is more limited today due to L scapula area pain.  Guarding present.  He believes pain will decrease prior to home. This is the most it has hurt him and started about a week ago     Vision         Perception     Praxis      Pertinent Vitals/Pain Pain Assessment: Faces Faces Pain Scale: Hurts whole lot Pain Location: L area between shoulder blade and spine Pain Descriptors / Indicators: Tender;Sharp;Guarding;Grimacing Pain Intervention(s): Limited activity within patient's tolerance;Heat applied     Hand Dominance Right   Extremity/Trunk Assessment Upper Extremity Assessment Upper Extremity Assessment: Defer to OT evaluation RUE Deficits / Details: able to use RUE but did not use it independently to raise cup to mouth:  had held it with 2 hands LUE Deficits / Details: limited movement due to pain. Lifted approximately 60 degrees to place hand on RW   Lower Extremity Assessment Lower Extremity Assessment: Generalized weakness;RLE deficits/detail RLE Deficits /  Details: post op shoe. R foot bandaged.   Cervical / Trunk Assessment Cervical / Trunk Assessment: Kyphotic   Communication Communication Communication: No difficulties   Cognition Arousal/Alertness: Awake/alert Behavior During Therapy: WFL for tasks assessed/performed Overall Cognitive Status: Within Functional Limits for tasks assessed                                     General Comments        Exercises     Shoulder Instructions      Home Living Family/patient expects to be discharged to:: Unsure Living Arrangements: Spouse/significant other Available Help at Discharge: Family;Available 24 hours/day Type of Home: Independent living facility Home Access: Level entry           Bathroom Shower/Tub: Tub/shower unit (main bathroom; walk in hall bathroom)   Bathroom Toilet: Handicapped height (only one toilet has a bar)     Home Equipment: Walker - 2 wheels (wife's)          Prior Functioning/Environment Level of Independence: Independent with assistive device(s)        Comments: for distance uses wife's walker        OT Problem List: Decreased strength;Decreased activity tolerance;Impaired balance (sitting and/or standing);Pain;Decreased knowledge of use of DME or AE      OT Treatment/Interventions: Self-care/ADL training;Therapeutic exercise;Patient/family education;Balance training;Therapeutic activities;DME and/or AE instruction    OT Goals(Current goals can be found in the care plan section) Acute Rehab OT Goals Patient Stated Goal: less pain and home OT Goal Formulation: With patient Time For Goal Achievement: 10/12/17 Potential to Achieve Goals: Good ADL Goals Pt Will Perform Eating: with set-up;sitting Pt Will Perform Grooming: with set-up;sitting Pt Will Perform Upper Body Bathing: with min guard assist;with min assist;sitting Pt Will Perform Lower Body Bathing: with min assist;sit to/from stand Pt Will Perform Upper Body Dressing: with set-up;sitting Pt Will Perform Lower Body Dressing: with min assist;sit to/from stand Pt Will Transfer to Toilet: with min guard assist;ambulating;grab bars (hgih commode) Pt Will Perform Toileting - Clothing Manipulation and hygiene: with supervision;sit to/from stand Additional ADL Goal #1: pt will perform bed mobility with min A in preparation for adls Additional ADL Goal #2: pt will perform scapula  exercises and shoulder AROM wtihin painfree tolerance to increase strength for adls  OT Frequency: Min 2X/week   Barriers to D/C:            Co-evaluation PT/OT/SLP Co-Evaluation/Treatment: Yes Reason for Co-Treatment: For patient/therapist safety;Complexity of the patient's impairments (multi-system involvement) PT goals addressed during session: Mobility/safety with mobility OT goals addressed during session: ADL's and self-care      AM-PAC PT "6 Clicks" Daily Activity     Outcome Measure Help from another person eating meals?: A Little Help from another person taking care of personal grooming?: A Lot Help from another person toileting, which includes using toliet, bedpan, or urinal?: A Little Help from another person bathing (including washing, rinsing, drying)?: A Lot Help from another person to put on and taking off regular upper body clothing?: A Lot Help from another person to put on and taking off regular lower body clothing?: A Lot 6 Click Score: 14   End of Session    Activity Tolerance: Patient limited by pain Patient left: in bed;with call bell/phone within reach (4 bedrails)  OT Visit Diagnosis: Muscle weakness (generalized) (M62.81);Pain Pain - Right/Left: Left Pain - part of body:  (scapula)  Time: 2951-8841 OT Time Calculation (min): 31 min Charges:  OT General Charges $OT Visit: 1 Visit OT Evaluation $OT Eval Moderate Complexity: 1 Mod G-Codes:     Charles [name], Charles [name] [phone] 10/05/2017  Charles [name] 10/05/2017, 3:57 PM

## 2017-10-05 NOTE — Progress Notes (Signed)
Contacted by nurse regarding patient's concerns with MOON letter and hospital cost. Spoke with patient at bedside, tried to explain notice again. He did not have specific questions. Patient wants me to speak with spouse but she will not be available until after 2pm. Provided him with my cell number, encouraged him to have his wife call me with questions. [phone]

## 2017-10-05 NOTE — Progress Notes (Addendum)
PROGRESS NOTE    Charles [name]  NWG:[id number] DOB: Jul 31, 1931 DOA: 10/03/2017 PCP: Leanna Battles, MD   Brief Narrative:  83 y.o. male with hx of DJD, AVM's, BPH, CAD, depression, hx radiation (prostate ca), HL, prostate Ca, OSA, urinary retention , back surgery (1996,  2014, 2017), recently discharged on 10/30 after admission for rectal bleeding when he had upper and lower GI endoscopy presented with not improvement in rectal bleeding.  Assessment & Plan:   Principal Problem:   Hematochezia Active Problems:   Essential hypertension   Depression   Malignant neoplasm of prostate (HCC)   Chronic bilateral low back pain with bilateral sciatica   Chronic pain syndrome   Lower GI bleeding   History of back surgery   Rectal bleeding Acute blood loss anemia OSA on CPAP  - Recent upper and lower GI endoscopy showed diverticulosis in the left colon which could be the source of bleeding. Hemoglobin is stable. Patient reported no bowel movement since he came to hospital. He likes to talk to GI about the further plan. Patient reported concern about the insurance and once the formal new diagnosis for this admission. Education provided to the patient. Patient remains clinically stable and has noticed no bleeding. I'll follow-up GI further plan and further recommendation. Continue to monitor. Case manager referred to addressed medical insurance and discharge planning. -Continue Robaxin and Ultram as needed for back spasm which is chronic. -Continue CPAP -Ordered PT, OT evaluation.  DVT prophylaxis: SCD and ambulation Code Status: Full code Family Communication: No family at bedside. Disposition Plan: Currently admitted. Dispo as per GI    Consultants:   GI  Procedures: None Antimicrobials: None  Subjective: Seen and examined at bedside. No bowel movement since he came to hospital. No headache, dizziness, nausea vomiting chest pain shortness of breath. He was concerned about  payment for this hospitalization, transportation to home, not having new diagnosis for this admission etc.  Objective: Vitals:   10/04/17 1346 10/04/17 2057 10/04/17 2103 10/05/17 0615  BP: (!) 113/50  (!) 133/55 124/62  Pulse: 61 71 (!) 58 (!) 116  Resp: 18 18 20 20   Temp: 98.4 F (36.9 C)  98.7 F (37.1 C) 99 F (37.2 C)  TempSrc: Oral  Oral Oral  SpO2: 97% 97% 99% 99%  Weight:      Height:        Intake/Output Summary (Last 24 hours) at 10/05/17 1156 Last data filed at 10/05/17 1000  Gross per 24 hour  Intake             1040 ml  Output              225 ml  Net              815 ml   Filed Weights   10/04/17 0115  Weight: 82.8 kg (182 lb 8.7 oz)    Examination:  General exam: Not in distress, lying in bed comfortable Respiratory system: Clear bilaterally, respiratory effort normal, no wheezing or crackle Cardiovascular system: Regular rate rhythm, S1 and S2 normal. No pedal edema Gastrointestinal system: Abdomen soft, nontender, nondistended. Bowel sound positive. Central nervous system: Alert and oriented. No focal neurological deficits. Skin: No rashes, lesions or ulcers Psychiatry: Judgement and insight appear normal. Mood & affect appropriate.     Data Reviewed: I have personally reviewed following labs and imaging studies  CBC:  Recent Labs Lab 09/30/17 0304 09/30/17 5784  10/02/17 0302 10/02/17 1048 10/03/17 2038 10/04/17 6962  10/05/17 0533  WBC 7.1 5.2  --   --   --  6.2 6.2 5.5  HGB 11.7* 11.5*  < > 11.6* 11.1* 11.2* 10.5* 10.7*  HCT 35.4* 34.4*  --   --   --  34.3* 32.0* 32.6*  MCV 90.1 90.1  --   --   --  90.0 90.1 90.6  PLT 171 170  --   --   --  169 154 148*  < > = values in this interval not displayed. Basic Metabolic Panel:  Recent Labs Lab 09/29/17 1941 09/30/17 0304 10/01/17 0525 10/03/17 2130  NA 139 137 144 140  K 4.0 3.8 3.7 3.8  CL 108 107 112* 109  CO2 23 24 23 23   GLUCOSE 117* 104* 100* 93  BUN 25* 21* 13 13    CREATININE 0.99 0.88 0.86 0.84  CALCIUM 9.1 8.7* 8.9 9.2   GFR: Estimated Creatinine Clearance: 66.1 mL/min (by C-G formula based on SCr of 0.84 mg/dL). Liver Function Tests:  Recent Labs Lab 09/29/17 1941 10/01/17 0525  AST 53* 51*  ALT 23 24  ALKPHOS 76 62  BILITOT 0.4 0.7  PROT 6.8 6.9  ALBUMIN 3.8 3.9   No results for input(s): LIPASE, AMYLASE in the last 168 hours. No results for input(s): AMMONIA in the last 168 hours. Coagulation Profile:  Recent Labs Lab 09/29/17 1941 10/03/17 2038  INR 1.07 0.95   Cardiac Enzymes: No results for input(s): CKTOTAL, CKMB, CKMBINDEX, TROPONINI in the last 168 hours. BNP (last 3 results) No results for input(s): PROBNP in the last 8760 hours. HbA1C: No results for input(s): HGBA1C in the last 72 hours. CBG:  Recent Labs Lab 09/30/17 0822  GLUCAP 93   Lipid Profile: No results for input(s): CHOL, HDL, LDLCALC, TRIG, CHOLHDL, LDLDIRECT in the last 72 hours. Thyroid Function Tests: No results for input(s): TSH, T4TOTAL, FREET4, T3FREE, THYROIDAB in the last 72 hours. Anemia Panel: No results for input(s): VITAMINB12, FOLATE, FERRITIN, TIBC, IRON, RETICCTPCT in the last 72 hours. Sepsis Labs: No results for input(s): PROCALCITON, LATICACIDVEN in the last 168 hours.  No results found for this or any previous visit (from the past 240 hour(s)).       Radiology Studies: No results found.      Scheduled Meds: . buPROPion  150 mg Oral BID  . calcium-vitamin D  1 tablet Oral BID  . cholecalciferol  1,000 Units Oral Daily  . famotidine  10 mg Oral QHS  . ferrous sulfate  325 mg Oral Q breakfast  . gabapentin  300 mg Oral TID  . magnesium oxide  200 mg Oral QPM  . multivitamin with minerals  1 tablet Oral Q breakfast  . pantoprazole  40 mg Oral Daily  . pravastatin  20 mg Oral q1800  . propranolol  10 mg Oral Daily  . rOPINIRole  2 mg Oral QHS  . sodium chloride flush  3 mL Intravenous Q12H  . tamsulosin  0.4 mg  Oral BID   Continuous Infusions: . sodium chloride       LOS: 1 day    Dron Tanna Furry, MD [facility] Pager [phone]  If 7PM-7AM, please contact night-coverage [email or web address] Password [email or web address] 10/05/2017, 11:56 AM

## 2017-10-05 NOTE — Evaluation (Addendum)
Physical Therapy Evaluation Patient Details Name: Charles [name] MRN: [id number] DOB: 1931/11/01 Today's Date: 10/05/2017   History of Present Illness  83 yo male admitted with GI bleed. Onset of L shoulder blade area pain during hospital stay. Hx of R 4th toe amp 09/2017, chronic fecal incontinence, multiple back surgeries, Barrett's esophagus, sleep apnea, osteoporosis, CAD  Clinical Impression  On eval, pt required Mod assist for bed mobility and Min assist to stand. He walked ~200 feet with a RW. Moderate pain in L shoulder blade area that was very TTP and limiting use of L UE. Pain is significantly affecting pt's functional mobility. Discussed d/c plan-pt refused ST rehab placement during eval. He is from Ind Living at [facility] where he lives with his wife. Anticipate pt will have difficulty mobilizing at home however he is unwilling to consider SNF.     Follow Up Recommendations SNF (if pt will agree). Home health PT;Supervision/Assistance - 22 hour;Home Health Aide, if possible. (Pt refused ST SNF stay during evaluation.)    Equipment Recommendations  None recommended by PT    Recommendations for Other Services OT consult     Precautions / Restrictions Precautions Precautions: Fall Restrictions Weight Bearing Restrictions: No Other Position/Activity Restrictions: RLE WBAT with post op shoe      Mobility  Bed Mobility Overal bed mobility: Needs Assistance Bed Mobility: Supine to Sit;Sit to Supine     Supine to sit: Mod assist;HOB elevated Sit to supine: Mod assist;HOB elevated   General bed mobility comments: assist for trunk getting up and legs for back to bed. Also helped him reposition. Increased time.   Transfers Overall transfer level: Needs assistance Equipment used: Rolling walker (2 wheeled) Transfers: Sit to/from Stand Sit to Stand: Min assist         General transfer comment: assist to rise and steady. Cues for best placement of UEs. Increased  time.  Ambulation/Gait Ambulation/Gait assistance: Min guard Ambulation Distance (Feet): 200 Feet Assistive device: Rolling walker (2 wheeled) Gait Pattern/deviations: Step-through pattern;Trunk flexed     General Gait Details: very close guarding. Pt tends to maintain forward flexed trunk posture. Some difficulty turning walker.   Stairs            Wheelchair Mobility    Modified Rankin (Stroke Patients Only)       Balance Overall balance assessment: Needs assistance         Standing balance support: Bilateral upper extremity supported Standing balance-Leahy Scale: Poor                               Pertinent Vitals/Pain Pain Assessment: Faces Faces Pain Scale: Hurts whole lot Pain Location: L area between shoulder blade and spine Pain Descriptors / Indicators: Tender;Sharp;Guarding;Grimacing Pain Intervention(s): Limited activity within patient's tolerance;Heat applied    Home Living Family/patient expects to be discharged to:: Unsure Living Arrangements: Spouse/significant other Available Help at Discharge: Family;Available 24 hours/day Type of Home: Independent living facility Home Access: Level entry       Home Equipment: Walker - 2 wheels (wife's)      Prior Function Level of Independence: Independent with assistive device(s)         Comments: for distance uses wife's walker     Hand Dominance   Dominant Hand: Right    Extremity/Trunk Assessment   Upper Extremity Assessment Upper Extremity Assessment: Defer to OT evaluation RUE Deficits / Details: able to use RUE but did  not use it independently to raise cup to mouth:  had held it with 2 hands LUE Deficits / Details: limited movement due to pain. Lifted approximately 60 degrees to place hand on RW    Lower Extremity Assessment Lower Extremity Assessment: Generalized weakness;RLE deficits/detail RLE Deficits / Details: post op shoe. R foot bandaged.    Cervical / Trunk  Assessment Cervical / Trunk Assessment: Kyphotic  Communication   Communication: No difficulties  Cognition Arousal/Alertness: Awake/alert Behavior During Therapy: WFL for tasks assessed/performed Overall Cognitive Status: Within Functional Limits for tasks assessed                                        General Comments      Exercises     Assessment/Plan    PT Assessment Patient needs continued PT services  PT Problem List Decreased strength;Decreased mobility;Decreased activity tolerance;Decreased range of motion;Pain;Decreased balance;Decreased knowledge of use of DME       PT Treatment Interventions DME instruction;Therapeutic exercise;Therapeutic activities;Gait training;Patient/family education;Balance training;Functional mobility training    PT Goals (Current goals can be found in the Care Plan section)  Acute Rehab PT Goals Patient Stated Goal: less pain and home PT Goal Formulation: With patient Time For Goal Achievement: 10/19/17 Potential to Achieve Goals: Good    Frequency Min 3X/week   Barriers to discharge        Co-evaluation   Reason for Co-Treatment: For patient/therapist safety;Complexity of the patient's impairments (multi-system involvement) PT goals addressed during session: Mobility/safety with mobility OT goals addressed during session: ADL's and self-care       AM-PAC PT "6 Clicks" Daily Activity  Outcome Measure Difficulty turning over in bed (including adjusting bedclothes, sheets and blankets)?: Unable Difficulty moving from lying on back to sitting on the side of the bed? : Unable Difficulty sitting down on and standing up from a chair with arms (e.g., wheelchair, bedside commode, etc,.)?: Unable Help needed moving to and from a bed to chair (including a wheelchair)?: A Lot Help needed walking in hospital room?: A Lot Help needed climbing 3-5 steps with a railing? : A Lot 6 Click Score: 9    End of Session Equipment  Utilized During Treatment: Gait belt Activity Tolerance: Patient limited by pain Patient left: in bed;with call bell/phone within reach   PT Visit Diagnosis: Pain;Muscle weakness (generalized) (M62.81);Difficulty in walking, not elsewhere classified (R26.2) Pain - Right/Left: Left Pain - part of body: Shoulder    Time: 2202-5427 PT Time Calculation (min) (ACUTE ONLY): 38 min   Charges:   PT Evaluation $PT Eval Moderate Complexity: 1 Mod     PT G Codes:          [name], MPT Pager: [phone]

## 2017-10-06 DIAGNOSIS — G4733 Obstructive sleep apnea (adult) (pediatric): Secondary | ICD-10-CM

## 2017-10-06 DIAGNOSIS — Z9989 Dependence on other enabling machines and devices: Secondary | ICD-10-CM

## 2017-10-06 LAB — CBC
HCT: 35.3 % — ABNORMAL LOW (ref 39.0–52.0)
HEMOGLOBIN: 11.6 g/dL — AB (ref 13.0–17.0)
MCH: 29.8 pg (ref 26.0–34.0)
MCHC: 32.9 g/dL (ref 30.0–36.0)
MCV: 90.7 fL (ref 78.0–100.0)
PLATELETS: 161 10*3/uL (ref 150–400)
RBC: 3.89 MIL/uL — AB (ref 4.22–5.81)
RDW: 14.8 % (ref 11.5–15.5)
WBC: 7 10*3/uL (ref 4.0–10.5)

## 2017-10-06 NOTE — Care Management Note (Signed)
Case Management Note  Patient Details  Name: Charles [name] MRN: [id number] Date of Birth: [birth year]  Subjective/Objective:   83 y.o. M to be discharged today. He will return  to [place] at [facility] where he will receive HHPT and Aide. CM faxed DCS and orders including F2F to [facility], the provider of these services at [facility] to [phone], after obtaining this # from [facility]. No other CM needs at this time.                  Action/Plan:CM will sign off for now but will be available should additional discharge needs arise or disposition change.    Expected Discharge Date:  10/06/17               Expected Discharge Plan:  [place]  In-House Referral:  NA  Discharge planning Services  CM Consult  Post Acute Care Choice:  Home Health Choice offered to:  Spouse  DME Arranged:  N/A DME Agency:     HH Arranged:  PT, Nurse's Aide HH Agency:  Other - See comment [employer] HH ([place]))  Status of Service:  Completed, signed off  If discussed at [employer], dates discussed:    Additional Comments:  Delrae Sawyers, RN 10/06/2017, 12:31 PM

## 2017-10-06 NOTE — Discharge Summary (Signed)
Physician Discharge Summary  HILARY PUNDT XBD:[id number] DOB: 1931-03-03 DOA: 10/03/2017  PCP: Leanna Battles, MD  Admit date: 10/03/2017 Discharge date: 10/06/2017  Admitted From: ILF Disposition: ILF   Recommendations for Outpatient Follow-up:  1. Follow up with PCP in 1-2 weeks 2. Follow up with GI  3. Monitor CBC  Home Health: PT, aide, 24-hour supervision (SNF recommended but declined) Equipment/Devices: None new Discharge Condition: Stable CODE STATUS: Full Diet recommendation: Soft  Brief/Interim Summary: Charles [name] is an 83 y.o.malewith hx of DJD, AVM's, BPH, depression, hx radiation (prostate CA), HL, prostate Ca, OSA, urinary retention , back surgery (1996, 2014, 2017), recently discharged on 10/30 after admission for rectal bleeding when he had upper and lower GI endoscopy presented with not improvement in rectal bleeding. Patient had recurrent bleeding at home, having passed some blood clots in the shower and then several more times before returning to ED. Hgb stable at 11.2 on arrival, trending modestly down the following day despite no active bleeding since arrival. hgb rose spontaneously to 11.6 on 11/3 in the absence of ongoing bleeding and is stable from a gI standpoint for discharge.  Discharge Diagnoses:  Principal Problem:   Hematochezia Active Problems:   Essential hypertension   Depression   OSA on CPAP   Malignant neoplasm of prostate (HCC)   Chronic bilateral low back pain with bilateral sciatica   Chronic pain syndrome   Lower GI bleeding   History of back surgery   Rectal bleeding   Diverticulosis of colon with hemorrhage   Acute blood loss anemia  Lower GI bleeding: Suspected to be diverticular in origin after colonoscopy earlier this week. Also had a medium sized AVM in the ascending colon and radiation changes in rectum-both of those were non-bleeding at the time of procedure, but ? If the radiation changes could be the source  (doubt that right sided AVM was the source).  - No further bleeding since admission. GI signed off recommending soft diet.  - Ok to DC, given return precautions.  - Avoid constipation - Avoid NSAIDs  Acute blood loss anemia: Hgb stable without intervention.  - Monitor CBC at follow up. - continue po iron  - Not on antiplatelet or anticoagulation.   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  - Has follow up Monday for suture removal, reevaluation.   Thoracic back paraspinal spasm: Acute on chronic.  - Mobilize as able - antispasmodics and ultram prn (home meds) - Continue home PT  HLD: - Continue statin  Essential tremor: Chronic, stable. - Continue beta blocker  OSA: -Continue CPAP  Discharge Instructions Discharge Instructions    Call MD for:  difficulty breathing, headache or visual disturbances    Complete by:  As directed    Call MD for:  extreme fatigue    Complete by:  As directed    Call MD for:  persistant dizziness or light-headedness    Complete by:  As directed    Call MD for:  persistant nausea and vomiting    Complete by:  As directed    Call MD for:  severe uncontrolled pain    Complete by:  As directed    Call MD for:  temperature >100.4    Complete by:  As directed    Discharge instructions    Complete by:  As directed    - You should have home health services arranged prior to discharge - Continue a soft diet and avoid constipation by using  senna if needed - Diverticular bleeding is very unpredictable. If you have significant rectal bleeding, return for evaluation. - Continue taking iron tablets - Follow up with orthopedics Monday and schedule GI follow up in about 2 weeks.   Increase activity slowly    Complete by:  As directed      Allergies as of 10/06/2017      Reactions   Celebrex [celecoxib] Other (See Comments)   Created stomach ulcers.   Cyclobenzaprine Other (See Comments)   Caught in hiatal hernia and  caused extreme pain      Medication List    TAKE these medications   acetaminophen 500 MG tablet Commonly known as:  TYLENOL Take 1,000 mg by mouth every 6 (six) hours as needed for mild pain. TAKES WITH TRAMADOL   buPROPion 150 MG 12 hr tablet Commonly known as:  WELLBUTRIN SR Take 150 mg by mouth 2 (two) times daily.   CENTRUM SILVER ADULT 50+ Tabs Take 1 tablet by mouth daily with breakfast.   cholecalciferol 1000 units tablet Commonly known as:  VITAMIN D Take 1,000 Units by mouth daily.   CITRACAL PLUS BONE DENSITY Tabs Take 1 tablet by mouth 2 (two) times daily.   ferrous sulfate 325 (65 FE) MG EC tablet Take 325 mg by mouth daily with breakfast.   gabapentin 300 MG capsule Commonly known as:  NEURONTIN Take 1 tablet PO at 4 PM  and 1 tablet at 10 PM What changed:  how much to take  how to take this  when to take this  additional instructions   ipratropium 0.03 % nasal spray Commonly known as:  ATROVENT Place 2 sprays into both nostrils every 12 (twelve) hours.   lovastatin 20 MG tablet Commonly known as:  MEVACOR Take 20 mg by mouth at bedtime.   LUPRON DEPOT (30-MONTH) IM Inject into the muscle. NEXT INJECTION IS DUE 02-2017.  GETS AT [facility]   Magnesium 250 MG Tabs Take 250 mg by mouth every evening.   methocarbamol 500 MG tablet Commonly known as:  ROBAXIN Take 500 mg by mouth every 8 (eight) hours as needed for muscle spasms.   omeprazole 40 MG capsule Commonly known as:  PRILOSEC Take 1 capsule (40 mg total) by mouth 2 (two) times daily.   propranolol 10 MG tablet Commonly known as:  INDERAL Take 10 mg by mouth daily.   ranitidine 300 MG tablet Commonly known as:  ZANTAC Take 1 tablet (300 mg total) by mouth at bedtime.   rOPINIRole 1 MG tablet Commonly known as:  REQUIP Take 2 tablets (2 mg total) by mouth at bedtime.   senna 8.6 MG Tabs tablet Commonly known as:  SENOKOT Take 1 tablet by mouth daily as needed for mild  constipation.   tamsulosin 0.4 MG Caps capsule Commonly known as:  FLOMAX Take 0.4 mg by mouth 2 (two) times daily.   traMADol 50 MG tablet Commonly known as:  ULTRAM Take 2 tablets (100 mg total) by mouth 2 (two) times daily as needed for moderate pain.   triamcinolone cream 0.1 % Commonly known as:  KENALOG Apply 1 application topically daily as needed (RASH).   zoledronic acid 5 MG/100ML Soln injection Commonly known as:  RECLAST Inject into the vein. NEXT IS DUE 03-2017      Follow-up Information    [name], MD. Schedule an appointment as soon as possible for a visit in 2 week(s).   Specialty:  Gastroenterology Contact information: [address]  [place] [address] [phone]          Allergies  Allergen Reactions  . Celebrex [Celecoxib] Other (See Comments)    Created stomach ulcers.  . Cyclobenzaprine Other (See Comments)    Caught in hiatal hernia and caused extreme pain    Consultations:  [facility]  Procedures/Studies: No results found.   Subjective: Feels stable, no further bleeding. Was told there was redness in toilet paper on wiping last night, but has not had any further bleeding with BMs since then. No lightheadedness or other bleeding noted.   Discharge Exam: BP (!) 139/58 (BP Location: Left Arm)   Pulse (!) 58   Temp 97.9 F (36.6 C) (Oral)   Resp 18   Ht 5\' 6"  (1.676 m)   Wt 82.3 kg (181 lb 7 oz)   SpO2 98%   BMI 29.28 kg/m   General: Pt is alert, awake, not in acute distress Cardiovascular: RRR, II/VI SEM, no rubs, no gallops, no JVD Respiratory: CTA bilaterally, no wheezing, no rhonchi Abdominal: Soft, NT, ND, bowel sounds + Rectal: No external hemorrhoids, abscess/fistula or bleeding. Extremities: No edema, no cyanosis  Labs: Basic Metabolic Panel:  Recent Labs Lab 09/29/17 1941 09/30/17 0304 10/01/17 0525 10/03/17 2130  NA 139 137 144 140  K 4.0 3.8 3.7 3.8  CL 108 107 112* 109  CO2 23 24 23 23    GLUCOSE 117* 104* 100* 93  BUN 25* 21* 13 13  CREATININE 0.99 0.88 0.86 0.84  CALCIUM 9.1 8.7* 8.9 9.2   Liver Function Tests:  Recent Labs Lab 09/29/17 1941 10/01/17 0525  AST 53* 51*  ALT 23 24  ALKPHOS 76 62  BILITOT 0.4 0.7  PROT 6.8 6.9  ALBUMIN 3.8 3.9   No results for input(s): LIPASE, AMYLASE in the last 168 hours. No results for input(s): AMMONIA in the last 168 hours. CBC:  Recent Labs Lab 09/30/17 0812  10/02/17 1048 10/03/17 2038 10/04/17 0454 10/05/17 0533 10/06/17 0524  WBC 5.2  --   --  6.2 6.2 5.5 7.0  HGB 11.5*  < > 11.1* 11.2* 10.5* 10.7* 11.6*  HCT 34.4*  --   --  34.3* 32.0* 32.6* 35.3*  MCV 90.1  --   --  90.0 90.1 90.6 90.7  PLT 170  --   --  169 154 148* 161  < > = values in this interval not displayed. Cardiac Enzymes: No results for input(s): CKTOTAL, CKMB, CKMBINDEX, TROPONINI in the last 168 hours. BNP: Invalid input(s): POCBNP CBG:  Recent Labs Lab 09/30/17 0822  GLUCAP 93   D-Dimer No results for input(s): DDIMER in the last 72 hours. Hgb A1c No results for input(s): HGBA1C in the last 72 hours. Lipid Profile No results for input(s): CHOL, HDL, LDLCALC, TRIG, CHOLHDL, LDLDIRECT in the last 72 hours. Thyroid function studies No results for input(s): TSH, T4TOTAL, T3FREE, THYROIDAB in the last 72 hours.  Invalid input(s): FREET3 Anemia work up No results for input(s): VITAMINB12, FOLATE, FERRITIN, TIBC, IRON, RETICCTPCT in the last 72 hours. Urinalysis    Component Value Date/Time   COLORURINE YELLOW 03/31/2013 1042   APPEARANCEUR CLEAR 03/31/2013 1042   LABSPEC 1.015 01/04/2017 1627   PHURINE 6.5 01/04/2017 1627   PHURINE 6.5 03/31/2013 1042   GLUCOSEU Negative 01/04/2017 1627   HGBUR Trace 01/04/2017 1627   HGBUR NEGATIVE 03/31/2013 1042   BILIRUBINUR Negative 01/04/2017 1627   KETONESUR Negative 01/04/2017 1627   KETONESUR NEGATIVE 03/31/2013 1042   PROTEINUR  Negative 01/04/2017 1627   PROTEINUR NEGATIVE  03/31/2013 1042   UROBILINOGEN 0.2 01/04/2017 1627   NITRITE Negative 01/04/2017 1627   NITRITE NEGATIVE 03/31/2013 1042   LEUKOCYTESUR Negative 01/04/2017 1627    Time coordinating discharge: Approximately 40 minutes  Vance Gather, MD  [facility] 10/06/2017, 11:56 AM Pager [phone]

## 2017-10-06 NOTE — Progress Notes (Signed)
Physical Therapy Treatment Patient Details Name: Charles [name] MRN: [id number] DOB: 1931-09-20 Today's Date: 10/06/2017    History of Present Illness 83 yo male admitted with GI bleed. Onsert of L shoulder blade area pain during hospital stay. Hx of R 4th toe amp 09/2017, chronic fecal incontinence, multiple back surgeries, Barrett's esophagus, sleep apnea, osteoporosis, CAD    PT Comments    Pt still has pain at left and right shoulde that limits bed mobility , but is able to walk well with RW once he is up on his feet.  His wife says he has an appointment Monday to get the stitches out of his right foot. They both feel he will be able to go home today and manage okay at home, but are willing to have HHPT if they feel they need it once they get home.   Follow Up Recommendations  Home health PT;Supervision/Assistance - 24 hour (home health aide if possible )     Equipment Recommendations  None recommended by PT    Recommendations for Other Services OT consult     Precautions / Restrictions Precautions Precautions: Fall Restrictions Weight Bearing Restrictions: No Other Position/Activity Restrictions: RLE WBAT with post op shoe    Mobility  Bed Mobility Overal bed mobility: Needs Assistance Bed Mobility: Rolling;Sidelying to Sit Rolling: Min assist (needs extra time, painful, pulls on bedrails ) Sidelying to sit: Min assist       General bed mobility comments: needs extra time. has pain and difficulty pushing up into sitting   Transfers Overall transfer level: Modified independent Equipment used: Rolling walker (2 wheeled) Transfers: Sit to/from Stand Sit to Stand: Modified independent (Device/Increase time)            Ambulation/Gait Ambulation/Gait assistance: Min guard Ambulation Distance (Feet): 800 Feet (200 feet, rest, then 600 feet ) Assistive device: Rolling walker (2 wheeled) Gait Pattern/deviations: Step-through pattern     General Gait Details:  pt tends to lean forward as he walks but is able to straighten up with diminishing cues  and self correct at end of walk    Stairs            Wheelchair Mobility    Modified [facility] (Stroke Patients Only)       Balance                                            Cognition Arousal/Alertness: Awake/alert Behavior During Therapy: WFL for tasks assessed/performed Overall Cognitive Status: Within Functional Limits for tasks assessed                                        Exercises      General Comments        Pertinent Vitals/Pain Pain Assessment: Faces Faces Pain Scale: Hurts even more Pain Location: shoulder blade area on both sides  Pain Descriptors / Indicators: Aching Pain Intervention(s): Limited activity within patient's tolerance;Repositioned;Heat applied (pt has moist head pad in bed )    Home Living                      Prior Function            PT Goals (current goals can now be found in the care plan section) Acute  Rehab PT Goals Patient Stated Goal: to go home today     Frequency    Min 3X/week      PT Plan Current plan remains appropriate    Co-evaluation              AM-PAC PT "6 Clicks" Daily Activity  Outcome Measure  Difficulty turning over in bed (including adjusting bedclothes, sheets and blankets)?: A Lot Difficulty moving from lying on back to sitting on the side of the bed? : A Lot Difficulty sitting down on and standing up from a chair with arms (e.g., wheelchair, bedside commode, etc,.)?: A Little Help needed moving to and from a bed to chair (including a wheelchair)?: A Little Help needed walking in hospital room?: A Little Help needed climbing 3-5 steps with a railing? : A Lot 6 Click Score: 15    End of Session Equipment Utilized During Treatment: Gait belt Activity Tolerance: Patient limited by pain Patient left: in chair;with call bell/phone within reach;with  family/visitor present   PT Visit Diagnosis: Pain;Muscle weakness (generalized) (M62.81);Difficulty in walking, not elsewhere classified (R26.2) Pain - Right/Left: Left Pain - part of body: Shoulder     Time: 0930-1002 PT Time Calculation (min) (ACUTE ONLY): 32 min  Charges:  $Gait Training: 23-37 mins                    G Codes:       Shemeka Wardle K. Owens Shark, PT     [name] 10/06/2017, 10:14 AM

## 2017-10-06 NOTE — Progress Notes (Signed)
Discharge insturction reviewed with patient and his wife using teach back method.  They demonstrated understanding of instructions.  Patient stable for discharge home.  Zandra Abts [facility]  10/06/2017

## 2017-10-08 DIAGNOSIS — L97512 Non-pressure chronic ulcer of other part of right foot with fat layer exposed: Secondary | ICD-10-CM | POA: Diagnosis not present

## 2017-10-08 DIAGNOSIS — Z4789 Encounter for other orthopedic aftercare: Secondary | ICD-10-CM | POA: Diagnosis not present

## 2017-10-17 ENCOUNTER — Encounter (INDEPENDENT_AMBULATORY_CARE_PROVIDER_SITE_OTHER): Payer: Self-pay

## 2017-10-17 ENCOUNTER — Encounter: Payer: Self-pay | Admitting: Physician Assistant

## 2017-10-17 ENCOUNTER — Ambulatory Visit (INDEPENDENT_AMBULATORY_CARE_PROVIDER_SITE_OTHER): Payer: Medicare Other | Admitting: Physician Assistant

## 2017-10-17 ENCOUNTER — Other Ambulatory Visit (INDEPENDENT_AMBULATORY_CARE_PROVIDER_SITE_OTHER): Payer: Medicare Other

## 2017-10-17 VITALS — BP 114/52 | HR 56 | Ht 66.0 in | Wt 177.8 lb

## 2017-10-17 DIAGNOSIS — D62 Acute posthemorrhagic anemia: Secondary | ICD-10-CM | POA: Diagnosis not present

## 2017-10-17 DIAGNOSIS — K922 Gastrointestinal hemorrhage, unspecified: Secondary | ICD-10-CM | POA: Diagnosis not present

## 2017-10-17 LAB — CBC WITH DIFFERENTIAL/PLATELET
BASOS PCT: 0.5 % (ref 0.0–3.0)
Basophils Absolute: 0 10*3/uL (ref 0.0–0.1)
EOS PCT: 4.7 % (ref 0.0–5.0)
Eosinophils Absolute: 0.2 10*3/uL (ref 0.0–0.7)
HCT: 33.6 % — ABNORMAL LOW (ref 39.0–52.0)
Hemoglobin: 11.2 g/dL — ABNORMAL LOW (ref 13.0–17.0)
LYMPHS ABS: 0.7 10*3/uL (ref 0.7–4.0)
Lymphocytes Relative: 13.2 % (ref 12.0–46.0)
MCHC: 33.3 g/dL (ref 30.0–36.0)
MCV: 91 fl (ref 78.0–100.0)
MONO ABS: 0.5 10*3/uL (ref 0.1–1.0)
MONOS PCT: 10.1 % (ref 3.0–12.0)
NEUTROS ABS: 3.7 10*3/uL (ref 1.4–7.7)
NEUTROS PCT: 71.5 % (ref 43.0–77.0)
PLATELETS: 223 10*3/uL (ref 150.0–400.0)
RBC: 3.69 Mil/uL — ABNORMAL LOW (ref 4.22–5.81)
RDW: 15.2 % (ref 11.5–15.5)
WBC: 5.1 10*3/uL (ref 4.0–10.5)

## 2017-10-17 NOTE — Patient Instructions (Signed)
Your physician has requested that you go to the basement for the following lab work before leaving today: CBC   Low fiber/low residue diet for the next week then gradually add fiber back into your diet. You can use Metamucil.

## 2017-10-17 NOTE — Progress Notes (Signed)
Reviewed and agree with management plan. Resolved diverticular bleed.  He likely has minor rectal bleeding intermittently from radiation proctitis.  An annual appt for GERD, Barrett's follow up is sufficient.   Pricilla Riffle. Fuller Plan, MD [facility]

## 2017-10-17 NOTE — Progress Notes (Signed)
Chief Complaint: Follow-up lower GI bleed  HPI:    Charles [name] is an 83 year old Caucasian male with a past medical history as listed below including AVMs and a history of radiation for prostate cancer, who follows with Dr. Fuller Plan and presents to clinic today for follow-up after recent lower GI bleed.      Per review of chart patient was admitted to the hospital 10/03/17-10/06/17.  Patient presented to the ER with rectal bleeding which recurred.  He had recently been discharged 10/02/17 after rectal bleeding.  Patient did have an upper and lower GI endoscopy at that time.  He then had recurrent bleeding at home passing clots in the shower and several more times before returning to the ED.  At arrival patient's hemoglobin was 11.2.  There was no active bleeding since his arrival.  His hemoglobin rose spontaneously to 11.6 on 11/3 in the absence of ongoing bleeding and patient was stable to discharge.  His bleeding was suspected to be diverticular in origin after colonoscopy earlier that week.  He had a medium-sized AVM in the ascending colon and radiation changes in the rectum, both of which were nonbleeding at the time procedure.  Patient was continued on p.o. Iron and his ASA was stopped.    Today, the patient presents to clinic accompanied by his wife and tells me that he did continue with a small amount of bright red blood when he would wipe after a bowel movement since leaving the hospital but for the past 3 days has seen no more bleeding at all.  He has been having regular bowel movements and continues with no abdominal pain.  He and his wife have multiple questions regarding his diet.  Apparently, he has been staying on a soft diet since returning from the hospital and they are wondering when he can return to regular food.  They also have questions regarding a follow-up appointment with Dr. Fuller Plan in the future    Patient denies fever, chills, change in bowel habits, shortness of breath, dizziness or  abdominal pain.   Past Medical History:  Diagnosis Date  . Arthritis   . AVM (arteriovenous malformation)   . Barrett's esophagus   . BPH (benign prostatic hyperplasia)   . CAD (coronary artery disease)    only on stress test; never had MI, PCI or CABG.  In 2008, LVEF was reportedly normal.   . Cataract   . Constipation   . CPAP (continuous positive airway pressure) dependence   . Depression   . Diverticulosis   . Diverticulosis   . GERD (gastroesophageal reflux disease)   . History of radiation therapy   . Hyperlipidemia   . Hypoglycemia   . Lymphocytosis 06/2012  . Mastodynia   . Murmur   . Nasal congestion   . Osteoporosis   . Osteoporosis   . Prostate cancer ([place])   . Sleep apnea    wears CPAP  . Urinary retention with incomplete bladder emptying    pt uses bathroom and within 30 mins needs to go again  . Vitamin D deficiency     Past Surgical History:  Procedure Laterality Date  . BACK SURGERY  04/2013  . back surgury     f  . BLEPHAROPLASTY Bilateral 02/23/15  . CATARACT EXTRACTION    . COLONOSCOPY    . EYE SURGERY Bilateral   . FINGER SURGERY Right    right thumb  . INGUINAL HERNIA REPAIR     x4  . REPAIR SPIGELIAN  HERNIA  2011  . SPINAL FUSION  1996  . TONSILLECTOMY    . UPPER GASTROINTESTINAL ENDOSCOPY    . VASECTOMY      Current Outpatient Medications  Medication Sig Dispense Refill  . acetaminophen (TYLENOL) 500 MG tablet Take 1,000 mg by mouth every 6 (six) hours as needed for mild pain. TAKES WITH TRAMADOL    . buPROPion (WELLBUTRIN SR) 150 MG 12 hr tablet Take 150 mg by mouth 2 (two) times daily.     . cholecalciferol (VITAMIN D) 1000 UNITS tablet Take 1,000 Units by mouth daily.    . ferrous sulfate 325 (65 FE) MG EC tablet Take 325 mg by mouth daily with breakfast.    . gabapentin (NEURONTIN) 300 MG capsule Take 1 tablet PO at 4 PM  and 1 tablet at 10 PM (Patient taking differently: Take 300 mg by mouth 3 (three) times daily. Take 1 tablet PO  at 8am, 2 at 4 PM  and 2 tablet at 10 PM) 180 capsule 3  . ipratropium (ATROVENT) 0.03 % nasal spray Place 2 sprays into both nostrils every 12 (twelve) hours.    . Leuprolide Acetate, 6 Month, (LUPRON DEPOT, 23-MONTH, IM) Inject into the muscle. NEXT INJECTION IS DUE 02-2017.  GETS AT [facility]    . lovastatin (MEVACOR) 20 MG tablet Take 20 mg by mouth at bedtime.     . Magnesium 250 MG TABS Take 250 mg by mouth every evening.     . methocarbamol (ROBAXIN) 500 MG tablet Take 500 mg by mouth every 8 (eight) hours as needed for muscle spasms.    . Multiple Minerals-Vitamins (CITRACAL PLUS BONE DENSITY) TABS Take 1 tablet by mouth 2 (two) times daily.     . Multiple Vitamins-Minerals (CENTRUM SILVER ADULT 50+) TABS Take 1 tablet by mouth daily with breakfast.    . omeprazole (PRILOSEC) 40 MG capsule Take 1 capsule (40 mg total) by mouth 2 (two) times daily. 180 capsule 3  . propranolol (INDERAL) 10 MG tablet Take 10 mg by mouth daily.     . ranitidine (ZANTAC) 300 MG tablet Take 1 tablet (300 mg total) by mouth at bedtime. 90 tablet 3  . rOPINIRole (REQUIP) 1 MG tablet Take 2 tablets (2 mg total) by mouth at bedtime. 60 tablet 3  . senna (SENOKOT) 8.6 MG TABS tablet Take 1 tablet by mouth daily as needed for mild constipation.    . Tamsulosin HCl (FLOMAX) 0.4 MG CAPS Take 0.4 mg by mouth 2 (two) times daily.     . traMADol (ULTRAM) 50 MG tablet Take 2 tablets (100 mg total) by mouth 2 (two) times daily as needed for moderate pain. 60 tablet 3  . triamcinolone cream (KENALOG) 0.1 % Apply 1 application topically daily as needed (RASH).    Marland Kitchen zoledronic acid (RECLAST) 5 MG/100ML SOLN injection Inject into the vein. NEXT IS DUE 03-2017     Current Facility-Administered Medications  Medication Dose Route Frequency Provider Last Rate Last Dose  . bupivacaine (MARCAINE) 0.5 % (with pres) injection 3 mL  3 mL Other Once Magnus Sinning, MD        Allergies as of 10/17/2017 - Review Complete  10/03/2017  Allergen Reaction Noted  . Celebrex [celecoxib] Other (See Comments) 04/23/2012  . Cyclobenzaprine Other (See Comments) 09/07/2015    Family History  Problem Relation Age of Onset  . Prostate cancer Father   . CAD Mother   . Arthritis-Osteo Brother     Social History  Socioeconomic History  . Marital status: Married    Spouse name: [name]  . Number of children: 3  . Years of education: College  . Highest education level: Not on file  Social Needs  . Financial resource strain: Not on file  . Food insecurity - worry: Not on file  . Food insecurity - inability: Not on file  . Transportation needs - medical: Not on file  . Transportation needs - non-medical: Not on file  Occupational History  . 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 Smoking status: Never Smoker  . Smokeless tobacco: Never Used  Substance and Sexual Activity  . Alcohol use: No  . Drug use: No  . Sexual activity: No  Other Topics Concern  . Not on file  Social History Narrative   Patient is married [name]).   Patient is a retired Arboriculturist.   Patient lives in [facility].   Patient has three adult children.   Patient does not drink any caffeine.   Patient is right-handed.    Review of Systems:    Constitutional: No weight loss, fever or chills Cardiovascular: No chest pain Respiratory: No SOB Gastrointestinal: See HPI and otherwise negative    Physical Exam:  Vital signs: BP (!) 114/52   Pulse (!) 56   Ht 5\' 6"  (1.676 m)   Wt 177 lb 12.8 oz (80.6 kg)   BMI 28.70 kg/m    Constitutional:   Pleasant Elderly Caucasian male appears to be in NAD, Well developed, Well nourished, alert and cooperative Respiratory: Respirations even and unlabored. Lungs clear to auscultation bilaterally.   No wheezes, crackles, or rhonchi.  Cardiovascular: Normal S1, S2. No MRG. Regular rate and rhythm. No peripheral edema, cyanosis or  pallor.  Gastrointestinal:  Soft, nondistended, nontender. No rebound or guarding. Normal bowel sounds. No appreciable masses or hepatomegaly. Rectal:  Not performed.  Msk:  Symmetrical without gross deformities. Without edema, no deformity or joint abnormality. Ambulates with a walker Psychiatric:  Demonstrates good judgement and reason without abnormal affect or behaviors.  RELEVANT LABS AND IMAGING: CBC    Component Value Date/Time   WBC 7.0 10/06/2017 0524   RBC 3.89 (L) 10/06/2017 0524   HGB 11.6 (L) 10/06/2017 0524   HGB 14.7 03/18/2013 1357   HCT 35.3 (L) 10/06/2017 0524   HCT 43.4 03/18/2013 1357   PLT 161 10/06/2017 0524   PLT 153 03/18/2013 1357   MCV 90.7 10/06/2017 0524   MCV 89.8 03/18/2013 1357   MCH 29.8 10/06/2017 0524   MCHC 32.9 10/06/2017 0524   RDW 14.8 10/06/2017 0524   RDW 13.4 03/18/2013 1357   LYMPHSABS 1.6 03/18/2013 1357   MONOABS 0.6 03/18/2013 1357   EOSABS 0.3 03/18/2013 1357   BASOSABS 0.0 03/18/2013 1357    CMP     Component Value Date/Time   NA 140 10/03/2017 2130   K 3.8 10/03/2017 2130   CL 109 10/03/2017 2130   CO2 23 10/03/2017 2130   GLUCOSE 93 10/03/2017 2130   BUN 13 10/03/2017 2130   CREATININE 0.84 10/03/2017 2130   CALCIUM 9.2 10/03/2017 2130   PROT 6.9 10/01/2017 0525   ALBUMIN 3.9 10/01/2017 0525   AST 51 (H) 10/01/2017 0525   ALT 24 10/01/2017 0525   ALKPHOS 62 10/01/2017 0525   BILITOT 0.7 10/01/2017 0525   GFRNONAA >60 10/03/2017 2130   GFRAA >60 10/03/2017 2130    Assessment: 1.  Lower  GI bleed: Bleeding completely stopped 3 days ago, patient has been feeling well, no abdominal pain, no shortness of breath, no dizziness or weakness; again this was thought to be diverticular 2.  Anemia: With above, will recheck today  Plan: 1.  We will recheck a CBC today to ensure that hemoglobin continues to remain stable/rise 2.  Reviewed diet recommendations.  Would recommend the patient stay on a soft diet for another week.   He can then return to his regular diet and increase fiber if he wishes with a fiber supplement such as Metamucil which he asked about today. 3.  Patient and his wife request a yearly follow-up appointment with Dr. Fuller Plan to be scheduled today.  We will schedule this in January for them. 4.  Patient to follow in clinic with Dr. Fuller Plan as above or sooner if necessary.  Ellouise Newer, PA-C [facility] 10/17/2017, 10:18 AM  Cc: Leanna Battles, MD

## 2017-10-22 ENCOUNTER — Telehealth: Payer: Self-pay | Admitting: Adult Health

## 2017-10-22 NOTE — Telephone Encounter (Signed)
Pt has called asking that RN Myriam Jacobson calls him with an update re: her trying to get the air pressure on his CPAP increased.  Please call

## 2017-10-23 DIAGNOSIS — Z6828 Body mass index (BMI) 28.0-28.9, adult: Secondary | ICD-10-CM | POA: Diagnosis not present

## 2017-10-23 DIAGNOSIS — I2510 Atherosclerotic heart disease of native coronary artery without angina pectoris: Secondary | ICD-10-CM | POA: Diagnosis not present

## 2017-10-23 DIAGNOSIS — G4733 Obstructive sleep apnea (adult) (pediatric): Secondary | ICD-10-CM | POA: Diagnosis not present

## 2017-10-23 DIAGNOSIS — K625 Hemorrhage of anus and rectum: Secondary | ICD-10-CM | POA: Diagnosis not present

## 2017-10-23 NOTE — Telephone Encounter (Signed)
I have contacted Dr Ellen Henri office for the 2nd time today to get the status on them sending me records. I had fax'ed them the medical records signed by Mr Keyworth on 10/08/2017. I called last week checking on the status and then again today. I am waiting on the call back from Treva to evaulate the status.

## 2017-10-30 NOTE — Telephone Encounter (Signed)
After 3rd attempt of reaching out to Dr Ellen Henri office our medical records personnel was able to get in touch with them. They state that they no longer have records for the patient. No baseline sleep study or office notes before or after. We will proceed forward with having a sleep test ordered for the patient here to establish him with a DME company locally

## 2017-10-31 ENCOUNTER — Other Ambulatory Visit: Payer: Self-pay | Admitting: Neurology

## 2017-10-31 DIAGNOSIS — G4733 Obstructive sleep apnea (adult) (pediatric): Secondary | ICD-10-CM

## 2017-10-31 NOTE — Progress Notes (Signed)
The patient's old records cannot be located.  He is not currently set up with a local DME company therefore we cannot adjust his pressure settings.  The patient will be set up for a repeat sleep study.

## 2017-10-31 NOTE — Telephone Encounter (Signed)
I have called today and spoke with Charles [name] and made him aware that despite our efforts I have been unsucessful with locating his medical records. I have informed him that the next step would be having him complete a sleep study thru [place] that way we would have one on record for him. The patient is agreeable to this. I have placed the order and the sleep lab will contact the patient to discuss further. Pt verbalized understanding.

## 2017-11-01 DIAGNOSIS — H524 Presbyopia: Secondary | ICD-10-CM | POA: Diagnosis not present

## 2017-11-01 DIAGNOSIS — H35373 Puckering of macula, bilateral: Secondary | ICD-10-CM | POA: Diagnosis not present

## 2017-11-16 DIAGNOSIS — K625 Hemorrhage of anus and rectum: Secondary | ICD-10-CM | POA: Diagnosis not present

## 2017-11-16 DIAGNOSIS — Z6828 Body mass index (BMI) 28.0-28.9, adult: Secondary | ICD-10-CM | POA: Diagnosis not present

## 2017-11-16 DIAGNOSIS — M545 Low back pain: Secondary | ICD-10-CM | POA: Diagnosis not present

## 2017-11-16 DIAGNOSIS — R2681 Unsteadiness on feet: Secondary | ICD-10-CM | POA: Diagnosis not present

## 2017-11-16 DIAGNOSIS — D649 Anemia, unspecified: Secondary | ICD-10-CM | POA: Diagnosis not present

## 2017-11-16 DIAGNOSIS — I2510 Atherosclerotic heart disease of native coronary artery without angina pectoris: Secondary | ICD-10-CM | POA: Diagnosis not present

## 2017-11-16 DIAGNOSIS — R7309 Other abnormal glucose: Secondary | ICD-10-CM | POA: Diagnosis not present

## 2017-11-17 ENCOUNTER — Other Ambulatory Visit: Payer: Self-pay | Admitting: Neurology

## 2017-11-17 DIAGNOSIS — G2581 Restless legs syndrome: Secondary | ICD-10-CM

## 2017-11-19 ENCOUNTER — Other Ambulatory Visit: Payer: Self-pay | Admitting: Neurology

## 2017-11-28 ENCOUNTER — Ambulatory Visit (INDEPENDENT_AMBULATORY_CARE_PROVIDER_SITE_OTHER): Payer: Medicare Other | Admitting: Neurology

## 2017-11-28 DIAGNOSIS — G4733 Obstructive sleep apnea (adult) (pediatric): Secondary | ICD-10-CM

## 2017-12-14 DIAGNOSIS — L84 Corns and callosities: Secondary | ICD-10-CM | POA: Diagnosis not present

## 2017-12-14 DIAGNOSIS — L97518 Non-pressure chronic ulcer of other part of right foot with other specified severity: Secondary | ICD-10-CM | POA: Diagnosis not present

## 2017-12-19 ENCOUNTER — Ambulatory Visit (INDEPENDENT_AMBULATORY_CARE_PROVIDER_SITE_OTHER): Payer: Medicare Other | Admitting: Gastroenterology

## 2017-12-19 ENCOUNTER — Encounter: Payer: Self-pay | Admitting: Gastroenterology

## 2017-12-19 VITALS — BP 110/68 | HR 64 | Ht 66.0 in | Wt 175.6 lb

## 2017-12-19 DIAGNOSIS — K921 Melena: Secondary | ICD-10-CM | POA: Diagnosis not present

## 2017-12-19 NOTE — Patient Instructions (Signed)
Continue milk of magnesia at bedtime.   You can start Colace (stool softener) daily.  Call our office back if your bowel movements are not adequately controlled with this regimen or if your bleeding persists so we can schedule you for a flexible sigmoidoscopy.  Thank you for choosing me and [facility].  Pricilla Riffle. Dagoberto Ligas., MD., [facility] Regal

## 2017-12-19 NOTE — Progress Notes (Signed)
    History of Present Illness: This is an 84 year old male returning for intermittent rectal bleeding.  He is accompanied by his wife.  He relates frequent problems with constipation and hard pellet like stools.  He was recently started on milk of magnesia by his PCP with some improvement in his stools.  He has persistent, intermittent rectal bleeding and sometimes notes small clots per rectum.  Colonoscopy performed in October 2018 outlined below.  Hemoglobin in mid December was 12.1.  Colonoscopy 09/2017 - Preparation of the colon was fair. - Diverticulosis in the left colon and in the right colon. - A single non-bleeding colonic angioectasia. - Increased mucosa vascular pattern in the rectum. - The examination was otherwise normal on direct and retroflexion views. - No specimens collected.  Current Medications, Allergies, Past Medical History, Past Surgical History, Family History and Social History were reviewed in [employer] record.  Physical Exam: General: Well developed, well nourished, frail, no acute distress Head: Normocephalic and atraumatic Eyes:  sclerae anicteric, EOMI Ears: Normal auditory acuity Mouth: No deformity or lesions Lungs: Clear throughout to auscultation Heart: Regular rate and rhythm; no murmurs, rubs or bruits Abdomen: Soft, non tender and non distended. No masses, hepatosplenomegaly or hernias noted. Normal Bowel sounds Rectal: not done Musculoskeletal: Symmetrical with no gross deformities  Pulses:  Normal pulses noted Extremities: No clubbing, cyanosis, edema or deformities noted Neurological: Alert oriented x 4, grossly nonfocal Psychological:  Alert and cooperative. Depressed affect  Assessment and Recommendations:  1. Hematochezia, small volume, intermittent.  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 Less likely colonic AVM and even less likely an intermittent, stuttering  diverticular bleed.  Continue MOM at bedtime.  Add Colace 1-2 pills daily.  The patient is advised to self adjust MOM and Colace dosing for formed stools that are not pellet like.  If his rectal bleeding persists despite these measures will plan to proceed with colonoscopy with APC ablation of AVM and radiation proctitis.

## 2017-12-20 DIAGNOSIS — L821 Other seborrheic keratosis: Secondary | ICD-10-CM | POA: Diagnosis not present

## 2017-12-20 DIAGNOSIS — L308 Other specified dermatitis: Secondary | ICD-10-CM | POA: Diagnosis not present

## 2017-12-20 DIAGNOSIS — D1801 Hemangioma of skin and subcutaneous tissue: Secondary | ICD-10-CM | POA: Diagnosis not present

## 2017-12-20 DIAGNOSIS — Z85828 Personal history of other malignant neoplasm of skin: Secondary | ICD-10-CM | POA: Diagnosis not present

## 2017-12-20 DIAGNOSIS — L812 Freckles: Secondary | ICD-10-CM | POA: Diagnosis not present

## 2017-12-21 DIAGNOSIS — Z1389 Encounter for screening for other disorder: Secondary | ICD-10-CM | POA: Diagnosis not present

## 2017-12-21 DIAGNOSIS — M545 Low back pain: Secondary | ICD-10-CM | POA: Diagnosis not present

## 2017-12-21 DIAGNOSIS — I2510 Atherosclerotic heart disease of native coronary artery without angina pectoris: Secondary | ICD-10-CM | POA: Diagnosis not present

## 2017-12-21 DIAGNOSIS — Z6827 Body mass index (BMI) 27.0-27.9, adult: Secondary | ICD-10-CM | POA: Diagnosis not present

## 2017-12-21 DIAGNOSIS — K625 Hemorrhage of anus and rectum: Secondary | ICD-10-CM | POA: Diagnosis not present

## 2017-12-21 LAB — CBC AND DIFFERENTIAL
HCT: 34 — AB (ref 41–53)
Hemoglobin: 10.9 — AB (ref 13.5–17.5)
PLATELETS: 172 (ref 150–399)
WBC: 6.2

## 2017-12-21 LAB — BASIC METABOLIC PANEL
BUN: 20 (ref 4–21)
CREATININE: 0.9 (ref 0.6–1.3)
Glucose: 73
POTASSIUM: 4.5 (ref 3.4–5.3)
Sodium: 139 (ref 137–147)

## 2018-01-02 ENCOUNTER — Telehealth: Payer: Self-pay | Admitting: Neurology

## 2018-01-02 NOTE — Telephone Encounter (Signed)
Pt called to r/s appt. He then wanted results for home sleep test done on 12/26/17. Pt is aware it can take up to 14 days to get those results.

## 2018-01-03 ENCOUNTER — Other Ambulatory Visit: Payer: Self-pay | Admitting: Neurology

## 2018-01-03 ENCOUNTER — Telehealth: Payer: Self-pay | Admitting: Neurology

## 2018-01-03 DIAGNOSIS — G4731 Primary central sleep apnea: Secondary | ICD-10-CM

## 2018-01-03 NOTE — Telephone Encounter (Signed)
-----   Message from Larey Seat, MD sent at 01/03/2018  8:33 AM EST ----- IMPRESSION:  1) Severe Complex Sleep apnea with 51.5 % Cheyne Stokes  respiration. 2) Borderline total hypoxemia time.  3) Bradycardia. RECOMMENDATION: this HST confirms the ongoing need for PAP  therapy, and supplies will be ordered now.  I strongly recommend to follow up with an attended sleep study  for titration to CPAP or BiPAP- given the more than 50% of events  were central apneas.

## 2018-01-03 NOTE — Procedures (Signed)
[facility] @[facility] [address], [place] [address] NAME:  [name]                                                        DOB: 09-26-1931 MEDICAL RECORD No: [id number]                                                     DOS: 12/26/2017 REFERRING PHYSICIAN: Leanna Battles, M.D. STUDY PERFORMED: Repeated HST on watch pat  HISTORY: Charles [name] is a 84 year old, right handed Caucasian male patient. He was tested for OSA 12 years ago at [facility] lab, where received the diagnosis of a the sleep apnea and was prescribed a CPAP machine through [place]. He received a new CPAP machine March 19th. 2015 by Dr. Philip Aspen.  He feels that his machine works fine for him but is sleepy. He wears a Lexicographer Full face mask. He needs a repeat HST re- evaluation to allow transfer of sleep care. Last HST on apnea link failed due to machine error (11-28-2017).  BMI 29. FSS at 28, Epworth score at 9 points.   STUDY RESULTS: Total Recording Time: 6 hours, and 15 minutes - all in supine sleep. Total Apnea/Hypopnea Index (AHI): 67.0 /hr. RDI was 67/hr.  Average Oxygen Saturation: 92 %, Lowest Oxygen Desaturation: 65 %  Total Time Oxygen Saturation Below 89%: 24.4 minutes  Average Heart Rate:  56 bpm (between 36 and 101 bpm)   IMPRESSION:  1) Severe Complex Sleep apnea with 51.5 % Cheyne Stokes respiration. 2) Borderline total hypoxemia time.  3) Bradycardia. RECOMMENDATION:  this HST confirms the ongoing need for PAP therapy, and supplies will be ordered now.  I strongly recommend to follow up with an attended sleep study for titration to CPAP or BiPAP- given the more than 50% of events were central apneas.  I certify that I have reviewed the raw data recording prior to the issuance of this report in accordance with the standards of the American Academy of Sleep Medicine (AASM).  Larey Seat, M.D.        01-03-2018  Medical Director  of [facility] at [facility], accredited by the AASM Diplomat of the ABPN and ABSM

## 2018-01-03 NOTE — Telephone Encounter (Signed)
Called the pt to discuss the sleep study. I have informed him that we found in the latest HST that he completed it was severe complex sleep apnea. I educated the pt on what all that meant. The patient currently owns a CPAP which he is using. Dr Lucretia Roers states that the patient needs to return for a cpap titration test in order to rule out that CPAP is worsening the apnea. The pt verbalized understanding. I transferred over to the sleep lab to get him scheduled to come in for this test. Pt verbalized understanding.

## 2018-01-04 ENCOUNTER — Other Ambulatory Visit: Payer: Self-pay | Admitting: Neurology

## 2018-01-07 DIAGNOSIS — L97511 Non-pressure chronic ulcer of other part of right foot limited to breakdown of skin: Secondary | ICD-10-CM | POA: Diagnosis not present

## 2018-01-07 DIAGNOSIS — M2041 Other hammer toe(s) (acquired), right foot: Secondary | ICD-10-CM | POA: Diagnosis not present

## 2018-01-07 DIAGNOSIS — L97519 Non-pressure chronic ulcer of other part of right foot with unspecified severity: Secondary | ICD-10-CM | POA: Diagnosis not present

## 2018-01-11 DIAGNOSIS — K625 Hemorrhage of anus and rectum: Secondary | ICD-10-CM | POA: Diagnosis not present

## 2018-01-11 LAB — CBC AND DIFFERENTIAL
HCT: 38 — AB (ref 41–53)
Hemoglobin: 11.7 — AB (ref 13.5–17.5)
Platelets: 183 (ref 150–399)
WBC: 5.7

## 2018-01-15 ENCOUNTER — Ambulatory Visit (INDEPENDENT_AMBULATORY_CARE_PROVIDER_SITE_OTHER): Payer: Medicare Other | Admitting: Orthopaedic Surgery

## 2018-01-15 ENCOUNTER — Encounter (INDEPENDENT_AMBULATORY_CARE_PROVIDER_SITE_OTHER): Payer: Self-pay | Admitting: Orthopaedic Surgery

## 2018-01-15 ENCOUNTER — Ambulatory Visit (INDEPENDENT_AMBULATORY_CARE_PROVIDER_SITE_OTHER): Payer: Medicare Other

## 2018-01-15 DIAGNOSIS — L97511 Non-pressure chronic ulcer of other part of right foot limited to breakdown of skin: Secondary | ICD-10-CM

## 2018-01-15 MED ORDER — SULFAMETHOXAZOLE-TRIMETHOPRIM 800-160 MG PO TABS: 1.0000 | ORAL_TABLET | Freq: Two times a day (BID) | ORAL | 0 refills | Status: DC

## 2018-01-15 NOTE — Progress Notes (Signed)
Office Visit Note   Patient: Charles [name]           Date of Birth: 1930-12-05           MRN: [id number] Visit Date: 01/15/2018              Requested by: Leanna Battles, MD [place], [place] [id number] PCP: Leanna Battles, MD   Assessment & Plan: Visit Diagnoses:  1. Skin ulcer of toe of right foot, limited to breakdown of skin ([place])     Plan: Impression with his third toe cellulitis with possible underlying.  Recommend MRI to fully evaluate for this.  In the meantime I have placed him on Bactrim.  Follow-up next week for MRI review.  Follow-Up Instructions: Return for next thursday.   Orders:  Orders Placed This Encounter  Procedures  . XR Foot Complete Right  . MR Toes Right W/Cm   Meds ordered this encounter  Medications  . sulfamethoxazole-trimethoprim (BACTRIM DS,SEPTRA DS) 800-160 MG tablet    Sig: Take 1 tablet by mouth 2 (two) times daily.    Dispense:  20 tablet    Refill:  0      Procedures: No procedures performed   Clinical Data: No additional findings.   Subjective: Chief Complaint  Patient presents with  . Right Foot - Wound Check    Patient is a 84 year old gentleman who is the husband of Aras Albarran who comes in with complaint of right third toe callus.  He has been seeing Dr. Doran Durand and his PA over the last couple weeks with his callus.  He has had increasing pain and swelling and redness in his third toe and he is here for another evaluation and second opinion.  He denies any constitutional symptoms.    Review of Systems  Constitutional: Negative.   All other systems reviewed and are negative.    Objective: Vital Signs: There were no vitals taken for this visit.  Physical Exam  Constitutional: He is oriented to person, place, and time. He appears well-developed and well-nourished.  HENT:  Head: Normocephalic and atraumatic.  Eyes: Pupils are equal, round, and reactive to light.  Neck: Neck supple.    Pulmonary/Chest: Effort normal.  Abdominal: Soft.  Musculoskeletal: Normal range of motion.  Neurological: He is alert and oriented to person, place, and time.  Skin: Skin is warm.  Psychiatric: He has a normal mood and affect. His behavior is normal. Judgment and thought content normal.  Nursing note and vitals reviewed.   Ortho Exam Right third toe exam does show swelling and cellulitis with a scant amount of purulent drainage when squeezed from the dry callus.  Neurovascularly intact Specialty Comments:  No specialty comments available.  Imaging: Xr Foot Complete Right  Result Date: 01/15/2018 No obvious evidence of bony erosion or osteomyelitis.     PMFS History: Patient Active Problem List   Diagnosis Date Noted  . Diverticulosis of colon with hemorrhage   . Acute blood loss anemia   . History of back surgery 10/03/2017  . Rectal bleeding 10/03/2017  . Hematochezia   . Lower GI bleeding 09/29/2017  . Treatment-emergent central sleep apnea 06/25/2017  . Chronic bilateral low back pain with bilateral sciatica 01/17/2017  . Post laminectomy syndrome 01/17/2017  . Chronic pain syndrome 01/17/2017  . Sacroiliitis ([place]) 01/17/2017  . Piriformis syndrome of right side 01/17/2017  . Malignant neoplasm of prostate ([place]) 07/19/2016  . Fixation hardware in spine 05/15/2016  . Hypoglycemic reaction  02/02/2015  . Tremor, essential 12/11/2014  . OSA on CPAP 04/24/2014  . 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 Hypersomnia with sleep apnea, unspecified 04/24/2014  . Depression 04/18/2013  . BPH (benign prostatic hyperplasia) 04/18/2013  . Impingement syndrome of right shoulder 04/14/2013    Class: Acute  . Scoliosis or kyphoscoliosis 04/08/2013    Class: Chronic  . Lumbosacral spondylosis without myelopathy 04/08/2013    Class: Chronic  . Acute respiratory failure ([place]) 04/08/2013  . Essential hypertension 04/08/2013  . Lymphocytosis   . HIATAL HERNIA 11/21/2010  .  DIVERTICULAR DISEASE 11/21/2010  . BARRETT'S ESOPHAGUS 02/22/2010  . GERD 07/20/2008  . INGUINAL HERNIA, LEFT, SMALL 07/20/2008  . OTH VENTRAL HERN W/O MENTION OBSTRUCTION/GANGREN 07/20/2008  . CONSTIPATION 07/20/2008  . DYSPHAGIA 07/20/2008   Past Medical History:  Diagnosis Date  . Arthritis   . AVM (arteriovenous malformation)   . Barrett's esophagus   . BPH (benign prostatic hyperplasia)   . CAD (coronary artery disease)    only on stress test; never had MI, PCI or CABG.  In 2008, LVEF was reportedly normal.   . Cataract   . Constipation   . CPAP (continuous positive airway pressure) dependence   . Depression   . Diverticulosis   . Diverticulosis   . GERD (gastroesophageal reflux disease)   . History of radiation therapy   . Hyperlipidemia   . Hypoglycemia   . Lymphocytosis 06/2012  . Mastodynia   . Murmur   . Nasal congestion   . Osteoporosis   . Osteoporosis   . Prostate cancer ([place])   . Sleep apnea    wears CPAP  . Urinary retention with incomplete bladder emptying    pt uses bathroom and within 30 mins needs to go again  . Vitamin D deficiency     Family History  Problem Relation Age of Onset  . Prostate cancer Father   . CAD Mother   . Arthritis-Osteo Brother   . Colon cancer Neg Hx   . Stomach cancer Neg Hx     Past Surgical History:  Procedure Laterality Date  . AMPUTATION TOE Right 09/20/2017   Procedure: Right 4th toe amputation;  Surgeon: Wylene Simmer, MD;  Location: [facility];  Service: Orthopedics;  Laterality: Right;  foot block  . BACK SURGERY  04/2013  . back surgury     f  . BLEPHAROPLASTY Bilateral 02/23/15  . CATARACT EXTRACTION    . COLONOSCOPY    . COLONOSCOPY N/A 10/02/2017   Procedure: COLONOSCOPY;  Surgeon: Doran Stabler, MD;  Location: [facility];  Service: Gastroenterology;  Laterality: N/A;  . COLONOSCOPY WITH PROPOFOL N/A 10/01/2017   Procedure: COLONOSCOPY WITH PROPOFOL;  Surgeon: Doran Stabler, MD;   Location: [facility];  Service: Gastroenterology;  Laterality: N/A;  . ESOPHAGOGASTRODUODENOSCOPY (EGD) WITH PROPOFOL N/A 10/01/2017   Procedure: ESOPHAGOGASTRODUODENOSCOPY (EGD) WITH PROPOFOL;  Surgeon: Doran Stabler, MD;  Location: [facility];  Service: Gastroenterology;  Laterality: N/A;  . EYE SURGERY Bilateral   . FINGER SURGERY Right    right thumb  . HARDWARE REMOVAL Right 05/15/2016   Procedure: Right Lumbar Two-Lumbar Five Removal of Hardware;  Surgeon: Kristeen Miss, MD;  Location: [facility];  Service: Neurosurgery;  Laterality: Right;  Right L2-5 removal of hardware  . INGUINAL HERNIA REPAIR     x4  . REPAIR SPIGELIAN HERNIA  2011  . SPINAL FUSION  1996  . TONSILLECTOMY    . UPPER GASTROINTESTINAL  ENDOSCOPY    . VASECTOMY     Social History   Occupational History  . 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 Smoking status: Never Smoker  . Smokeless tobacco: Never Used  Substance and Sexual Activity  . Alcohol use: No  . Drug use: No  . Sexual activity: No

## 2018-01-21 ENCOUNTER — Ambulatory Visit
Admission: RE | Admit: 2018-01-21 | Discharge: 2018-01-21 | Disposition: A | Discharge status: Home / Self Care | Payer: Medicare Other | Source: Ambulatory Visit | Attending: Orthopaedic Surgery | Admitting: Orthopaedic Surgery

## 2018-01-21 DIAGNOSIS — L97519 Non-pressure chronic ulcer of other part of right foot with unspecified severity: Secondary | ICD-10-CM | POA: Diagnosis not present

## 2018-01-21 DIAGNOSIS — L97511 Non-pressure chronic ulcer of other part of right foot limited to breakdown of skin: Secondary | ICD-10-CM

## 2018-01-21 MED ORDER — GADOBENATE DIMEGLUMINE 529 MG/ML IV SOLN: 15.0000 mL | Freq: Once | INTRAVENOUS | Status: DC | PRN

## 2018-01-24 ENCOUNTER — Encounter (INDEPENDENT_AMBULATORY_CARE_PROVIDER_SITE_OTHER): Payer: Self-pay | Admitting: Orthopaedic Surgery

## 2018-01-24 ENCOUNTER — Ambulatory Visit (INDEPENDENT_AMBULATORY_CARE_PROVIDER_SITE_OTHER): Payer: Medicare Other | Admitting: Orthopaedic Surgery

## 2018-01-24 DIAGNOSIS — L97511 Non-pressure chronic ulcer of other part of right foot limited to breakdown of skin: Secondary | ICD-10-CM | POA: Diagnosis not present

## 2018-01-24 DIAGNOSIS — M86171 Other acute osteomyelitis, right ankle and foot: Secondary | ICD-10-CM | POA: Diagnosis not present

## 2018-01-24 MED ORDER — SULFAMETHOXAZOLE-TRIMETHOPRIM 800-160 MG PO TABS: 1.0000 | ORAL_TABLET | Freq: Two times a day (BID) | ORAL | 0 refills | Status: DC

## 2018-01-24 NOTE — Progress Notes (Signed)
Office Visit Note   Patient: Charles [name]           Date of Birth: 08/27/1931           MRN: [id number] Visit Date: 01/24/2018              Requested by: Leanna Battles, MD [place], [place] [id number] PCP: Leanna Battles, MD   Assessment & Plan: Visit Diagnoses:  1. Skin ulcer of toe of right foot, limited to breakdown of skin ([place])   2. Acute osteomyelitis of toe of right foot ([place])     Plan: MRI of the right foot is consistent with osteomyelitis of the third toes of the distal phalanx.  There is some edema of the second toe but patient does not have any open wounds or any indication that there is osteomyelitis of the second toe.  Therefore we discussed partial third toe amputation with adjacent tissue rearrangement as indicated.  Prescription for Bactrim for now until he has the surgery.  Questions encouraged and answered. Total face to face encounter time was greater than 25 minutes and over half of this time was spent in counseling and/or coordination of care.  Follow-Up Instructions: Return if symptoms worsen or fail to improve.   Orders:  No orders of the defined types were placed in this encounter.  Meds ordered this encounter  Medications  . sulfamethoxazole-trimethoprim (BACTRIM DS,SEPTRA DS) 800-160 MG tablet    Sig: Take 1 tablet by mouth 2 (two) times daily.    Dispense:  20 tablet    Refill:  0      Procedures: No procedures performed   Clinical Data: No additional findings.   Subjective: Chief Complaint  Patient presents with  . Right Foot - Pain    Patient comes in today for review of his right foot MRI.    Review of Systems   Objective: Vital Signs: There were no vitals taken for this visit.  Physical Exam  Ortho Exam Exam stable Specialty Comments:  No specialty comments available.  Imaging: No results found.   PMFS History: Patient Active Problem List   Diagnosis Date Noted  . Diverticulosis of colon  with hemorrhage   . Acute blood loss anemia   . History of back surgery 10/03/2017  . Rectal bleeding 10/03/2017  . Hematochezia   . Lower GI bleeding 09/29/2017  . Treatment-emergent central sleep apnea 06/25/2017  . Chronic bilateral low back pain with bilateral sciatica 01/17/2017  . Post laminectomy syndrome 01/17/2017  . Chronic pain syndrome 01/17/2017  . Sacroiliitis ([place]) 01/17/2017  . Piriformis syndrome of right side 01/17/2017  . Malignant neoplasm of prostate ([place]) 07/19/2016  . Fixation hardware in spine 05/15/2016  . Hypoglycemic reaction 02/02/2015  . Tremor, essential 12/11/2014  . OSA on CPAP 04/24/2014  . 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 Hypersomnia with sleep apnea, unspecified 04/24/2014  . Depression 04/18/2013  . BPH (benign prostatic hyperplasia) 04/18/2013  . Impingement syndrome of right shoulder 04/14/2013    Class: Acute  . Scoliosis or kyphoscoliosis 04/08/2013    Class: Chronic  . Lumbosacral spondylosis without myelopathy 04/08/2013    Class: Chronic  . Acute respiratory failure ([place]) 04/08/2013  . Essential hypertension 04/08/2013  . Lymphocytosis   . HIATAL HERNIA 11/21/2010  . DIVERTICULAR DISEASE 11/21/2010  . BARRETT'S ESOPHAGUS 02/22/2010  . GERD 07/20/2008  . INGUINAL HERNIA, LEFT, SMALL 07/20/2008  . OTH VENTRAL HERN W/O MENTION OBSTRUCTION/GANGREN 07/20/2008  . CONSTIPATION 07/20/2008  .  DYSPHAGIA 07/20/2008   Past Medical History:  Diagnosis Date  . Arthritis   . AVM (arteriovenous malformation)   . Barrett's esophagus   . BPH (benign prostatic hyperplasia)   . CAD (coronary artery disease)    only on stress test; never had MI, PCI or CABG.  In 2008, LVEF was reportedly normal.   . Cataract   . Constipation   . CPAP (continuous positive airway pressure) dependence   . Depression   . Diverticulosis   . Diverticulosis   . GERD (gastroesophageal reflux disease)   . History of radiation therapy   . Hyperlipidemia   .  Hypoglycemia   . Lymphocytosis 06/2012  . Mastodynia   . Murmur   . Nasal congestion   . Osteoporosis   . Osteoporosis   . Prostate cancer ([place])   . Sleep apnea    wears CPAP  . Urinary retention with incomplete bladder emptying    pt uses bathroom and within 30 mins needs to go again  . Vitamin D deficiency     Family History  Problem Relation Age of Onset  . Prostate cancer Father   . CAD Mother   . Arthritis-Osteo Brother   . Colon cancer Neg Hx   . Stomach cancer Neg Hx     Past Surgical History:  Procedure Laterality Date  . AMPUTATION TOE Right 09/20/2017   Procedure: Right 4th toe amputation;  Surgeon: Wylene Simmer, MD;  Location: [facility];  Service: Orthopedics;  Laterality: Right;  foot block  . BACK SURGERY  04/2013  . back surgury     f  . BLEPHAROPLASTY Bilateral 02/23/15  . CATARACT EXTRACTION    . COLONOSCOPY    . COLONOSCOPY N/A 10/02/2017   Procedure: COLONOSCOPY;  Surgeon: Doran Stabler, MD;  Location: [facility];  Service: Gastroenterology;  Laterality: N/A;  . COLONOSCOPY WITH PROPOFOL N/A 10/01/2017   Procedure: COLONOSCOPY WITH PROPOFOL;  Surgeon: Doran Stabler, MD;  Location: [facility];  Service: Gastroenterology;  Laterality: N/A;  . ESOPHAGOGASTRODUODENOSCOPY (EGD) WITH PROPOFOL N/A 10/01/2017   Procedure: ESOPHAGOGASTRODUODENOSCOPY (EGD) WITH PROPOFOL;  Surgeon: Doran Stabler, MD;  Location: [facility];  Service: Gastroenterology;  Laterality: N/A;  . EYE SURGERY Bilateral   . FINGER SURGERY Right    right thumb  . HARDWARE REMOVAL Right 05/15/2016   Procedure: Right Lumbar Two-Lumbar Five Removal of Hardware;  Surgeon: Kristeen Miss, MD;  Location: [facility];  Service: Neurosurgery;  Laterality: Right;  Right L2-5 removal of hardware  . INGUINAL HERNIA REPAIR     x4  . REPAIR SPIGELIAN HERNIA  2011  . SPINAL FUSION  1996  . TONSILLECTOMY    . UPPER GASTROINTESTINAL ENDOSCOPY    . VASECTOMY     Social  History   Occupational History  . 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 Smoking status: Never Smoker  . Smokeless tobacco: Never Used  Substance and Sexual Activity  . Alcohol use: No  . Drug use: No  . Sexual activity: No

## 2018-01-31 ENCOUNTER — Other Ambulatory Visit (INDEPENDENT_AMBULATORY_CARE_PROVIDER_SITE_OTHER): Payer: Self-pay | Admitting: Orthopaedic Surgery

## 2018-02-03 ENCOUNTER — Ambulatory Visit (INDEPENDENT_AMBULATORY_CARE_PROVIDER_SITE_OTHER): Payer: Medicare Other | Admitting: Neurology

## 2018-02-03 DIAGNOSIS — G4731 Primary central sleep apnea: Secondary | ICD-10-CM

## 2018-02-05 ENCOUNTER — Other Ambulatory Visit: Payer: Self-pay | Admitting: Neurology

## 2018-02-05 ENCOUNTER — Telehealth: Payer: Self-pay | Admitting: Neurology

## 2018-02-05 DIAGNOSIS — G4730 Sleep apnea, unspecified: Secondary | ICD-10-CM

## 2018-02-05 DIAGNOSIS — G4733 Obstructive sleep apnea (adult) (pediatric): Secondary | ICD-10-CM

## 2018-02-05 DIAGNOSIS — G4731 Primary central sleep apnea: Secondary | ICD-10-CM

## 2018-02-05 NOTE — Telephone Encounter (Signed)
Called the pt to make him aware that we have his sleep study results. I informed him that we can finally get him established and get him supplies for his cpap machine. I will send the orders over to [facility] with hopes that his machine is auto titrate capable. In the event its not we can set it for him too. Pt verbalized understanding.

## 2018-02-05 NOTE — Procedures (Signed)
PATIENT'S NAME:  Charles [name], Charles [name] DOB:      1931-07-31      MR#:    [id number]     DATE OF RECORDING: 02/03/2018 REFERRING M.D.:  Leanna Battles, M.D. Study Performed:   Titration to CPAP HISTORY:  Charles [name] is an 84 year old, right handed, Caucasian male OSA patient on his second CPAP machine since March 19th. 2015, ordered by Dr. Terance Hart. He wears a Lexicographer Full face mask. He underwent a watch pat HST re- evaluation to allow transfer of sleep care and get supplies. The study took place on 12-26-2017, revealed an AHI of 67/hr. complex apnea, bradycardia and SpO2 nadir of 63%.  BMI 29. FSS at 28, Epworth score at 9 points. The patient's weight 183 pounds with a height of 66 (inches), resulting in a BMI of 29.4 kg/m2.The patient's neck circumference measured 16 inches.  CURRENT MEDICATIONS: Acetaminophen, Aspirin, Bupropion, Cholecalciferol, Ferrous Sulfate, Fluticasone, gabapentin, Leuprolide Acetate, Lovastatin, Magnesium, Methcarbamol, Multi-Vitamin, Mupirocin ointment, Nabumetone, Omega-3, Omeprazole, Oxymetazoline.  PROCEDURE:  This is a multichannel digital polysomnogram utilizing the SomnoStar 11.2 system.  Electrodes and sensors were applied and monitored per AASM Specifications.   EEG, EOG, Chin and Limb EMG, were sampled at 200 Hz.  ECG, Snore and Nasal Pressure, Thermal Airflow, Respiratory Effort, CPAP Flow and Pressure, Oximetry was sampled at 50 Hz. Digital video and audio were recorded.      CPAP was initiated at 5 cmH20 with heated humidity per AASM split night standards and pressure was advanced to 14 cmH20 with 1 cm EPR because of hypopneas, apneas and desaturations.  At a PAP pressure of 14 cmH20, there was a reduction of the AHI to 0.0 with improvement of obstructive sleep apnea.    Lights Out was at 22:16 and Lights On at 05:23. Total recording time (TRT) was 427.5 minutes, with a total sleep time (TST) of 314.5 minutes. The patient's sleep latency was 7.5 minutes  with 6 minutes of wake time after sleep onset. REM latency was 134.5 minutes.  The sleep efficiency was 73.6 %.    SLEEP ARCHITECTURE: WASO (Wake after sleep onset) was 99.5 minutes.  There were 15.5 minutes in Stage N1, 267 minutes Stage N2, 0 minutes Stage N3 and 32 minutes in Stage REM.  The percentage of Stage N1 was 4.9%, Stage N2 was 84.9%, Stage N3 was 0% and Stage R (REM sleep) was 10.2%. The sleep architecture was notable for a reduced REM proportion.  The arousals were noted as: 52 were spontaneous, 0 were associated with PLMs, and 45 were associated with respiratory events. Snoring was noted. EKG was in keeping with normal sinus rhythm (NSR).  RESPIRATORY ANALYSIS:  There was a total of 45 respiratory events: 0 apneas and 45 hypopneas with 0 respiratory event related arousals (RERAs).     The total APNEA/HYPOPNEA INDEX (AHI) was 8.6 /hour and the total RESPIRATORY DISTURBANCE INDEX was 8.6/ hour. 4 events occurred in REM sleep and 41 events in NREM. The REM AHI was 7.5 /hour versus a non-REM AHI of 8.7 /hour.  The patient spent 314.5 minutes of total sleep time in the supine position and 0 minutes in non-supine. The supine AHI was 8.6, versus a non-supine AHI of 0.0.  OXYGEN SATURATION & C02:  The baseline 02 saturation was 94%, with the lowest being 75%. Time spent below 89% saturation equaled 3 minutes.  PERIODIC LIMB MOVEMENTS:   The patient had a total of 0 Periodic Limb Movements.  Post-study, the  patient indicated that sleep was the same as usual.   The patient was fitted with a [employer] Simplus FF mask. The technologist did not specify the size.   DIAGNOSIS 1. Obstructive Sleep Apnea responding to CPAP at 14 cm water, 1 cm EPR and heated humidity.  2. Upper Airway Resistance Syndrome with snoring  3. NON- Periodic Limb Movements were noted throughout titration.    PLANS/RECOMMENDATIONS: auto titration capable CPAP machine, pressure window setting between 8 and 15 cm  water with 1 cm EPR and heated humidity, patient's FFM mask of choice.  a. Weight loss if appropriate. b. Avoidance of medications with muscle relaxant properties. c. Avoidance of ingestion of alcohol prior to sleeping. d. Avoiding sleeping in the supine position (on one's back). e. Avoiding driving when sleepy.    A follow up appointment will be scheduled in the Sleep Clinic at [facility].   Please call [phone] with any questions.     I certify that I have reviewed the entire raw data recording prior to the issuance of this report in accordance with the Standards of Accreditation of the American Academy of Sleep Medicine (AASM)    Larey Seat, M.D.    02-05-2018  Diplomat, American Board of Psychiatry and Neurology  Diplomat, [place] of Sleep Medicine Medical Director of [facility] at [facility]

## 2018-02-05 NOTE — Telephone Encounter (Signed)
-----   Message from Larey Seat, MD sent at 02/05/2018  3:11 PM EST ----- DIAGNOSIS 1. Obstructive Sleep Apnea responding to CPAP at 14 cm water, 1  cm EPR and heated humidity.  2. Upper Airway Resistance Syndrome with snoring  3. NON- Periodic Limb Movements were noted throughout titration. These are likely related to a post laminectomy syndrome.    PLANS/RECOMMENDATIONS: auto titration capable CPAP machine,  pressure window setting between 8 and 15 cm water with 1 cm EPR  and heated humidity, patient's FFM mask of choice.

## 2018-02-05 NOTE — Progress Notes (Signed)
t

## 2018-02-08 ENCOUNTER — Other Ambulatory Visit (INDEPENDENT_AMBULATORY_CARE_PROVIDER_SITE_OTHER): Payer: Self-pay | Admitting: Orthopaedic Surgery

## 2018-02-08 DIAGNOSIS — G8918 Other acute postprocedural pain: Secondary | ICD-10-CM | POA: Diagnosis not present

## 2018-02-08 DIAGNOSIS — M869 Osteomyelitis, unspecified: Secondary | ICD-10-CM

## 2018-02-08 DIAGNOSIS — M86171 Other acute osteomyelitis, right ankle and foot: Secondary | ICD-10-CM | POA: Diagnosis not present

## 2018-02-08 MED ORDER — SULFAMETHOXAZOLE-TRIMETHOPRIM 800-160 MG PO TABS: 1.0000 | ORAL_TABLET | Freq: Two times a day (BID) | ORAL | 0 refills | Status: DC

## 2018-02-12 DIAGNOSIS — C61 Malignant neoplasm of prostate: Secondary | ICD-10-CM | POA: Diagnosis not present

## 2018-02-18 DIAGNOSIS — K625 Hemorrhage of anus and rectum: Secondary | ICD-10-CM | POA: Diagnosis not present

## 2018-02-18 DIAGNOSIS — R7309 Other abnormal glucose: Secondary | ICD-10-CM | POA: Diagnosis not present

## 2018-02-18 DIAGNOSIS — M545 Low back pain: Secondary | ICD-10-CM | POA: Diagnosis not present

## 2018-02-18 DIAGNOSIS — C61 Malignant neoplasm of prostate: Secondary | ICD-10-CM | POA: Diagnosis not present

## 2018-02-18 DIAGNOSIS — R2681 Unsteadiness on feet: Secondary | ICD-10-CM | POA: Diagnosis not present

## 2018-02-18 DIAGNOSIS — Z6828 Body mass index (BMI) 28.0-28.9, adult: Secondary | ICD-10-CM | POA: Diagnosis not present

## 2018-02-18 DIAGNOSIS — G4733 Obstructive sleep apnea (adult) (pediatric): Secondary | ICD-10-CM | POA: Diagnosis not present

## 2018-02-18 DIAGNOSIS — I2510 Atherosclerotic heart disease of native coronary artery without angina pectoris: Secondary | ICD-10-CM | POA: Diagnosis not present

## 2018-02-18 DIAGNOSIS — D6489 Other specified anemias: Secondary | ICD-10-CM | POA: Diagnosis not present

## 2018-02-18 LAB — CBC AND DIFFERENTIAL
HEMATOCRIT: 32 — AB (ref 41–53)
HEMOGLOBIN: 10.1 — AB (ref 13.5–17.5)
Platelets: 175 (ref 150–399)
WBC: 3.9

## 2018-02-19 ENCOUNTER — Encounter (INDEPENDENT_AMBULATORY_CARE_PROVIDER_SITE_OTHER): Payer: Self-pay | Admitting: Orthopaedic Surgery

## 2018-02-19 ENCOUNTER — Ambulatory Visit (INDEPENDENT_AMBULATORY_CARE_PROVIDER_SITE_OTHER): Payer: Medicare Other | Admitting: Orthopaedic Surgery

## 2018-02-19 DIAGNOSIS — M86171 Other acute osteomyelitis, right ankle and foot: Secondary | ICD-10-CM

## 2018-02-19 DIAGNOSIS — L97511 Non-pressure chronic ulcer of other part of right foot limited to breakdown of skin: Secondary | ICD-10-CM

## 2018-02-19 NOTE — Progress Notes (Signed)
Patient is 10 days status post partial right third toe amputation.  He is overall doing well.  Denies any pain.  The incision is intact without any drainage or signs of infection.  At this point we will keep the stitches in for another week and half or so and will have him come back at that time for suture removal.  Bactroban ointment and dry dressing daily.  Weightbearing as tolerated in a postop shoe.  Questions encouraged and answered.  Follow-up in about a week and half.

## 2018-02-20 DIAGNOSIS — C61 Malignant neoplasm of prostate: Secondary | ICD-10-CM | POA: Diagnosis not present

## 2018-02-20 DIAGNOSIS — R3912 Poor urinary stream: Secondary | ICD-10-CM | POA: Diagnosis not present

## 2018-02-24 ENCOUNTER — Other Ambulatory Visit: Payer: Self-pay | Admitting: Gastroenterology

## 2018-02-28 ENCOUNTER — Encounter (HOSPITAL_COMMUNITY): Payer: Self-pay

## 2018-02-28 ENCOUNTER — Other Ambulatory Visit (INDEPENDENT_AMBULATORY_CARE_PROVIDER_SITE_OTHER): Payer: Medicare Other

## 2018-02-28 ENCOUNTER — Ambulatory Visit (INDEPENDENT_AMBULATORY_CARE_PROVIDER_SITE_OTHER): Payer: Medicare Other | Admitting: Physician Assistant

## 2018-02-28 ENCOUNTER — Encounter: Payer: Self-pay | Admitting: Physician Assistant

## 2018-02-28 ENCOUNTER — Other Ambulatory Visit: Payer: Self-pay

## 2018-02-28 VITALS — BP 120/63 | HR 61 | Ht 66.5 in | Wt 176.2 lb

## 2018-02-28 DIAGNOSIS — K627 Radiation proctitis: Secondary | ICD-10-CM

## 2018-02-28 DIAGNOSIS — Q2733 Arteriovenous malformation of digestive system vessel: Secondary | ICD-10-CM | POA: Diagnosis not present

## 2018-02-28 DIAGNOSIS — D649 Anemia, unspecified: Secondary | ICD-10-CM

## 2018-02-28 DIAGNOSIS — K625 Hemorrhage of anus and rectum: Secondary | ICD-10-CM | POA: Diagnosis not present

## 2018-02-28 DIAGNOSIS — K5520 Angiodysplasia of colon without hemorrhage: Secondary | ICD-10-CM

## 2018-02-28 LAB — CBC WITH DIFFERENTIAL/PLATELET
BASOS PCT: 0.9 % (ref 0.0–3.0)
Basophils Absolute: 0.1 10*3/uL (ref 0.0–0.1)
EOS PCT: 3 % (ref 0.0–5.0)
Eosinophils Absolute: 0.2 10*3/uL (ref 0.0–0.7)
HEMATOCRIT: 31.6 % — AB (ref 39.0–52.0)
HEMOGLOBIN: 10.6 g/dL — AB (ref 13.0–17.0)
LYMPHS PCT: 9.9 % — AB (ref 12.0–46.0)
Lymphs Abs: 0.6 10*3/uL — ABNORMAL LOW (ref 0.7–4.0)
MCHC: 33.4 g/dL (ref 30.0–36.0)
MCV: 89.5 fl (ref 78.0–100.0)
MONOS PCT: 11.6 % (ref 3.0–12.0)
Monocytes Absolute: 0.7 10*3/uL (ref 0.1–1.0)
Neutro Abs: 4.5 10*3/uL (ref 1.4–7.7)
Neutrophils Relative %: 74.6 % (ref 43.0–77.0)
Platelets: 178 10*3/uL (ref 150.0–400.0)
RBC: 3.53 Mil/uL — AB (ref 4.22–5.81)
RDW: 16 % — AB (ref 11.5–15.5)
WBC: 6.1 10*3/uL (ref 4.0–10.5)

## 2018-02-28 MED ORDER — NA SULFATE-K SULFATE-MG SULF 17.5-3.13-1.6 GM/177ML PO SOLN: 1.0000 | Freq: Once | ORAL | 0 refills | Status: AC

## 2018-02-28 NOTE — H&P (View-Only) (Signed)
Subjective:    Patient ID: Charles [name], male    DOB: Mar 29, 1931, 84 y.o.   MRN: [id number]  HPI Charles [name] is a very nice 84 year old white male known to Dr. Fuller Plan.  He is referred back today by Dr. Leanna Battles with ongoing complaints of rectal bleeding.  Patient has history of prostate cancer with radiation, diverticulosis, previous history of diverticular bleeding, GERD with Barrett's, sleep apnea and hypertension. He had hospitalization in October 2018 with what was felt to be a diverticular bleed.  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  He also had an increased mucosal pattern in the rectum consistent with radiation proctitis.  There were no internal hemorrhoids. He also underwent EGD during that admission showed a large hiatal hernia and short segment Barrett's which had previously been established. Hemoglobin had been checked in December 2018 was 12.1. He was seen in the office by Dr. Fuller Plan in January 2019 at that time with complaints of constipation and pellet-like stools.  He had been advised to take milk of magnesia which was helping.  He did say that he was having intermittent persistent rectal bleeding and sometimes seeing small clots. Plan was to pursue repeat colonoscopy with APC ablation of the AVM and radiation proctitis. Patient has been on an iron supplement.  He is also taking Colace twice daily currently and Senokot for the constipation which seems to be working well. He and his wife are very distressed currently because he is continued to have him times heavy episodes of bleeding.  He says he may go as long as a week without any obvious blood and then have 2 or 3 days in a row with very dark black or maroon her stools with clots.  He had an episode of bleeding this past weekend with passage of blood 9 separate times that day without much stool.  2 nights ago he was  awakened in the middle of the night with dark blood in the bed.  He has no complaints of rectal pain, no complaints of abdominal pain or cramping.  His energy level has been okay. He had CBC done on 02/18/2018 which showed hemoglobin 10.1 hematocrit of 31.6 and MCV of 93. Is not on any aspirin NSAIDs or blood thinners.  Review of Systems Pertinent positive and negative review of systems were noted in the above HPI section.  All other review of systems was otherwise negative.  Outpatient Encounter Medications as of 02/28/2018  Medication Sig  . acetaminophen (TYLENOL) 500 MG tablet Take 1,000 mg by mouth every 6 (six) hours as needed for mild pain. TAKES WITH TRAMADOL  . buPROPion (WELLBUTRIN SR) 150 MG 12 hr tablet Take 150 mg by mouth 2 (two) times daily.   . cholecalciferol (VITAMIN D) 1000 UNITS tablet Take 1,000 Units by mouth daily.  Marland Kitchen docusate sodium (COLACE) 100 MG capsule Take 100 mg by mouth 2 (two) times daily.  . ferrous sulfate 325 (65 FE) MG EC tablet Take 325 mg by mouth daily with breakfast.  . gabapentin (NEURONTIN) 300 MG capsule Take 1 tablet PO at 4 PM  and 1 tablet at 10 PM (Patient taking differently: Take 300 mg by mouth 3 (three) times daily. Take 1 tablet PO at 8am, 2 at 4 PM  and 2 tablet at 10 PM)  . ipratropium (ATROVENT) 0.03 % nasal spray Place 2 sprays into both nostrils every 12 (twelve) hours.  Marland Kitchen  lovastatin (MEVACOR) 20 MG tablet Take 20 mg by mouth at bedtime.   . Magnesium 250 MG TABS Take 250 mg by mouth every evening.   . magnesium hydroxide (MILK OF MAGNESIA) 400 MG/5ML suspension Take 5 mLs by mouth at bedtime as needed for mild constipation.  . methocarbamol (ROBAXIN) 500 MG tablet Take 500 mg by mouth every 8 (eight) hours as needed for muscle spasms.  . Multiple Minerals-Vitamins (CITRACAL PLUS BONE DENSITY) TABS Take 1 tablet by mouth 2 (two) times daily.   . Multiple Vitamins-Minerals (CENTRUM SILVER ADULT 50+) TABS Take 1 tablet by mouth daily with  breakfast.  . omeprazole (PRILOSEC) 40 MG capsule TAKE 1 CAPSULE(40 MG) BY MOUTH TWICE DAILY  . propranolol (INDERAL) 10 MG tablet Take 10 mg by mouth daily.   . ranitidine (ZANTAC) 300 MG tablet TAKE 1 TABLET(300 MG) BY MOUTH AT BEDTIME  . senna (SENOKOT) 8.6 MG TABS tablet Take 1 tablet by mouth daily as needed for mild constipation.  . Tamsulosin HCl (FLOMAX) 0.4 MG CAPS Take 0.4 mg by mouth 2 (two) times daily.   . traMADol (ULTRAM) 50 MG tablet Take 2 tablets (100 mg total) by mouth 2 (two) times daily as needed for moderate pain.  Marland Kitchen triamcinolone cream (KENALOG) 0.1 % Apply 1 application topically daily as needed (RASH).  Marland Kitchen zoledronic acid (RECLAST) 5 MG/100ML SOLN injection Inject into the vein. NEXT IS DUE 03-2017  . Na Sulfate-K Sulfate-Mg Sulf 17.5-3.13-1.6 GM/177ML SOLN Take 1 kit by mouth once for 1 dose.  . [DISCONTINUED] Leuprolide Acetate, 6 Month, (LUPRON DEPOT, 49-MONTH, IM) Inject into the muscle. NEXT INJECTION IS DUE 02-2017.  GETS AT [facility]  . [DISCONTINUED] rOPINIRole (REQUIP) 1 MG tablet TAKE 2 TABLETS(2 MG) BY MOUTH AT BEDTIME  . [DISCONTINUED] sulfamethoxazole-trimethoprim (BACTRIM DS,SEPTRA DS) 800-160 MG tablet Take 1 tablet by mouth 2 (two) times daily.  . [DISCONTINUED] sulfamethoxazole-trimethoprim (BACTRIM DS,SEPTRA DS) 800-160 MG tablet TAKE 1 TABLET BY MOUTH TWICE DAILY  . [DISCONTINUED] sulfamethoxazole-trimethoprim (BACTRIM DS,SEPTRA DS) 800-160 MG tablet Take 1 tablet by mouth 2 (two) times daily.   Facility-Administered Encounter Medications as of 02/28/2018  Medication  . bupivacaine (MARCAINE) 0.5 % (with pres) injection 3 mL   Allergies  Allergen Reactions  . Celebrex [Celecoxib] Other (See Comments)    Created stomach ulcers.  . Cyclobenzaprine Other (See Comments)    Caught in hiatal hernia and caused extreme pain   Patient Active Problem List   Diagnosis Date Noted  . Diverticulosis of colon with hemorrhage   . Acute blood loss anemia     . History of back surgery 10/03/2017  . Rectal bleeding 10/03/2017  . Hematochezia   . Lower GI bleeding 09/29/2017  . Treatment-emergent central sleep apnea 06/25/2017  . Chronic bilateral low back pain with bilateral sciatica 01/17/2017  . Post laminectomy syndrome 01/17/2017  . Chronic pain syndrome 01/17/2017  . Sacroiliitis ([place]) 01/17/2017  . Piriformis syndrome of right side 01/17/2017  . Malignant neoplasm of prostate ([place]) 07/19/2016  . Fixation hardware in spine 05/15/2016  . Hypoglycemic reaction 02/02/2015  . Tremor, essential 12/11/2014  . OSA on CPAP 04/24/2014  . 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 Hypersomnia with sleep apnea, unspecified 04/24/2014  . Depression 04/18/2013  . BPH (benign prostatic hyperplasia) 04/18/2013  . Impingement syndrome of right shoulder 04/14/2013    Class: Acute  . Scoliosis or kyphoscoliosis 04/08/2013    Class: Chronic  . Lumbosacral spondylosis without myelopathy 04/08/2013    Class:  Chronic  . Acute respiratory failure ([place]) 04/08/2013  . Essential hypertension 04/08/2013  . Lymphocytosis   . HIATAL HERNIA 11/21/2010  . DIVERTICULAR DISEASE 11/21/2010  . BARRETT'S ESOPHAGUS 02/22/2010  . GERD 07/20/2008  . INGUINAL HERNIA, LEFT, SMALL 07/20/2008  . OTH VENTRAL HERN W/O MENTION OBSTRUCTION/GANGREN 07/20/2008  . CONSTIPATION 07/20/2008  . DYSPHAGIA 07/20/2008   Social History   Socioeconomic History  . Marital status: Married    Spouse name: Charles [name]  . Number of children: 3  . Years of education: College  . Highest education level: Not on file  Occupational History  . 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 Financial resource strain: Not on file  . Food insecurity:    Worry: Not on file    Inability: Not on file  . Transportation needs:    Medical: Not on file    Non-medical: Not on file  Tobacco Use  . Smoking status: Never Smoker  . Smokeless tobacco: Never  Used  Substance and Sexual Activity  . Alcohol use: No  . Drug use: No  . Sexual activity: Never  Lifestyle  . Physical activity:    Days per week: Not on file    Minutes per session: Not on file  . Stress: Not on file  Relationships  . Social connections:    Talks on phone: Not on file    Gets together: Not on file    Attends religious service: Not on file    Active member of club or organization: Not on file    Attends meetings of clubs or organizations: Not on file    Relationship status: Not on file  . Intimate partner violence:    Fear of current or ex partner: Not on file    Emotionally abused: Not on file    Physically abused: Not on file    Forced sexual activity: Not on file  Other Topics Concern  . Not on file  Social History Narrative   Patient is married Charles [name]).   Patient is a retired Arboriculturist.   Patient lives in [facility].   Patient has three adult children.   Patient does not drink any caffeine.   Patient is right-handed.    Mr. Dierks family history includes Arthritis-Osteo in his brother; CAD in his mother; Prostate cancer in his father.      Objective:    Vitals:   02/28/18 0945  BP: 120/63  Pulse: 61    Physical Exam ; well-developed elderly white male in no acute distress, accompanied by his wife both very pleasant blood pressure 120/63 pulse 61, height 5 foot 6, weight 176, BMI 28.0.  HEENT; nontraumatic normocephalic EOMI PERRLA sclera anicteric, Cardiovascular regular rate and rhythm with S1-S2 no murmur rub or gallop, Pulmonary; clear bilaterally, Abdomen; soft, nontender nondistended bowel sounds are active there is no palpable mass or hepatosplenomegaly, Rectal; exam was not done, patient showed me a picture on his phone of a very dark blackish maroon bowel movement with clots.  Extremities ;no clubbing cyanosis or edema skin warm and dry, Neuro psych; mood and affect appropriate      Assessment & Plan:   #57;  85 year old white male with ongoing frequent intermittent rectal bleeding with very dark to maroonish stools with clots over the past couple of months.  He has had a 2 g drop in his hemoglobin over the past couple of months.  Exline He has previously  documented radiation proctitis and an AVM in the ascending colon either or both of which may be responsible for his ongoing bleeding. Also consider small bowel source for bleeding i.e. AVMs.  #2 anemia secondary to above #3.  Large hiatal hernia/short segment Barrett's #4 sleep apnea #5.  Hypertension #6.  Diverticulosis and history of diverticular bleeding #7.  Prostate CA status post radiation  Plan; repeat CBC today, will need to keep a close eye on his blood counts over the next couple of months with at least every 2 weeks CBCs, Patient will be scheduled for colonoscopy with APC of the radiation proctitis and the ascending colon AVM next week at [facility] with Dr. Fuller Plan.  (Thursday,8:30 AM 03/07/2018) Procedure was discussed in detail with the patient including indications risks and benefits and he is agreeable to proceed.  If patient continues to manifest bleeding after APC would consider capsule endoscopy.  Electa Sterry Genia Harold PA-C 02/28/2018   Cc: Leanna Battles, MD

## 2018-02-28 NOTE — Progress Notes (Signed)
Subjective:    Patient ID: Charles [name], male    DOB: Mar 29, 1931, 84 y.o.   MRN: [id number]  HPI Charles [name] is a very nice 84 year old white male known to Dr. Fuller Plan.  He is referred back today by Dr. Leanna Battles with ongoing complaints of rectal bleeding.  Patient has history of prostate cancer with radiation, diverticulosis, previous history of diverticular bleeding, GERD with Barrett's, sleep apnea and hypertension. He had hospitalization in October 2018 with what was felt to be a diverticular bleed.  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  He also had an increased mucosal pattern in the rectum consistent with radiation proctitis.  There were no internal hemorrhoids. He also underwent EGD during that admission showed a large hiatal hernia and short segment Barrett's which had previously been established. Hemoglobin had been checked in December 2018 was 12.1. He was seen in the office by Dr. Fuller Plan in January 2019 at that time with complaints of constipation and pellet-like stools.  He had been advised to take milk of magnesia which was helping.  He did say that he was having intermittent persistent rectal bleeding and sometimes seeing small clots. Plan was to pursue repeat colonoscopy with APC ablation of the AVM and radiation proctitis. Patient has been on an iron supplement.  He is also taking Colace twice daily currently and Senokot for the constipation which seems to be working well. He and his wife are very distressed currently because he is continued to have him times heavy episodes of bleeding.  He says he may go as long as a week without any obvious blood and then have 2 or 3 days in a row with very dark black or maroon her stools with clots.  He had an episode of bleeding this past weekend with passage of blood 9 separate times that day without much stool.  2 nights ago he was  awakened in the middle of the night with dark blood in the bed.  He has no complaints of rectal pain, no complaints of abdominal pain or cramping.  His energy level has been okay. He had CBC done on 02/18/2018 which showed hemoglobin 10.1 hematocrit of 31.6 and MCV of 93. Is not on any aspirin NSAIDs or blood thinners.  Review of Systems Pertinent positive and negative review of systems were noted in the above HPI section.  All other review of systems was otherwise negative.  Outpatient Encounter Medications as of 02/28/2018  Medication Sig  . acetaminophen (TYLENOL) 500 MG tablet Take 1,000 mg by mouth every 6 (six) hours as needed for mild pain. TAKES WITH TRAMADOL  . buPROPion (WELLBUTRIN SR) 150 MG 12 hr tablet Take 150 mg by mouth 2 (two) times daily.   . cholecalciferol (VITAMIN D) 1000 UNITS tablet Take 1,000 Units by mouth daily.  Marland Kitchen docusate sodium (COLACE) 100 MG capsule Take 100 mg by mouth 2 (two) times daily.  . ferrous sulfate 325 (65 FE) MG EC tablet Take 325 mg by mouth daily with breakfast.  . gabapentin (NEURONTIN) 300 MG capsule Take 1 tablet PO at 4 PM  and 1 tablet at 10 PM (Patient taking differently: Take 300 mg by mouth 3 (three) times daily. Take 1 tablet PO at 8am, 2 at 4 PM  and 2 tablet at 10 PM)  . ipratropium (ATROVENT) 0.03 % nasal spray Place 2 sprays into both nostrils every 12 (twelve) hours.  Marland Kitchen  lovastatin (MEVACOR) 20 MG tablet Take 20 mg by mouth at bedtime.   . Magnesium 250 MG TABS Take 250 mg by mouth every evening.   . magnesium hydroxide (MILK OF MAGNESIA) 400 MG/5ML suspension Take 5 mLs by mouth at bedtime as needed for mild constipation.  . methocarbamol (ROBAXIN) 500 MG tablet Take 500 mg by mouth every 8 (eight) hours as needed for muscle spasms.  . Multiple Minerals-Vitamins (CITRACAL PLUS BONE DENSITY) TABS Take 1 tablet by mouth 2 (two) times daily.   . Multiple Vitamins-Minerals (CENTRUM SILVER ADULT 50+) TABS Take 1 tablet by mouth daily with  breakfast.  . omeprazole (PRILOSEC) 40 MG capsule TAKE 1 CAPSULE(40 MG) BY MOUTH TWICE DAILY  . propranolol (INDERAL) 10 MG tablet Take 10 mg by mouth daily.   . ranitidine (ZANTAC) 300 MG tablet TAKE 1 TABLET(300 MG) BY MOUTH AT BEDTIME  . senna (SENOKOT) 8.6 MG TABS tablet Take 1 tablet by mouth daily as needed for mild constipation.  . Tamsulosin HCl (FLOMAX) 0.4 MG CAPS Take 0.4 mg by mouth 2 (two) times daily.   . traMADol (ULTRAM) 50 MG tablet Take 2 tablets (100 mg total) by mouth 2 (two) times daily as needed for moderate pain.  Marland Kitchen triamcinolone cream (KENALOG) 0.1 % Apply 1 application topically daily as needed (RASH).  Marland Kitchen zoledronic acid (RECLAST) 5 MG/100ML SOLN injection Inject into the vein. NEXT IS DUE 03-2017  . Na Sulfate-K Sulfate-Mg Sulf 17.5-3.13-1.6 GM/177ML SOLN Take 1 kit by mouth once for 1 dose.  . [DISCONTINUED] Leuprolide Acetate, 6 Month, (LUPRON DEPOT, 49-MONTH, IM) Inject into the muscle. NEXT INJECTION IS DUE 02-2017.  GETS AT [facility]  . [DISCONTINUED] rOPINIRole (REQUIP) 1 MG tablet TAKE 2 TABLETS(2 MG) BY MOUTH AT BEDTIME  . [DISCONTINUED] sulfamethoxazole-trimethoprim (BACTRIM DS,SEPTRA DS) 800-160 MG tablet Take 1 tablet by mouth 2 (two) times daily.  . [DISCONTINUED] sulfamethoxazole-trimethoprim (BACTRIM DS,SEPTRA DS) 800-160 MG tablet TAKE 1 TABLET BY MOUTH TWICE DAILY  . [DISCONTINUED] sulfamethoxazole-trimethoprim (BACTRIM DS,SEPTRA DS) 800-160 MG tablet Take 1 tablet by mouth 2 (two) times daily.   Facility-Administered Encounter Medications as of 02/28/2018  Medication  . bupivacaine (MARCAINE) 0.5 % (with pres) injection 3 mL   Allergies  Allergen Reactions  . Celebrex [Celecoxib] Other (See Comments)    Created stomach ulcers.  . Cyclobenzaprine Other (See Comments)    Caught in hiatal hernia and caused extreme pain   Patient Active Problem List   Diagnosis Date Noted  . Diverticulosis of colon with hemorrhage   . Acute blood loss anemia     . History of back surgery 10/03/2017  . Rectal bleeding 10/03/2017  . Hematochezia   . Lower GI bleeding 09/29/2017  . Treatment-emergent central sleep apnea 06/25/2017  . Chronic bilateral low back pain with bilateral sciatica 01/17/2017  . Post laminectomy syndrome 01/17/2017  . Chronic pain syndrome 01/17/2017  . Sacroiliitis ([place]) 01/17/2017  . Piriformis syndrome of right side 01/17/2017  . Malignant neoplasm of prostate ([place]) 07/19/2016  . Fixation hardware in spine 05/15/2016  . Hypoglycemic reaction 02/02/2015  . Tremor, essential 12/11/2014  . OSA on CPAP 04/24/2014  . 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 Hypersomnia with sleep apnea, unspecified 04/24/2014  . Depression 04/18/2013  . BPH (benign prostatic hyperplasia) 04/18/2013  . Impingement syndrome of right shoulder 04/14/2013    Class: Acute  . Scoliosis or kyphoscoliosis 04/08/2013    Class: Chronic  . Lumbosacral spondylosis without myelopathy 04/08/2013    Class:  Chronic  . Acute respiratory failure ([place]) 04/08/2013  . Essential hypertension 04/08/2013  . Lymphocytosis   . HIATAL HERNIA 11/21/2010  . DIVERTICULAR DISEASE 11/21/2010  . BARRETT'S ESOPHAGUS 02/22/2010  . GERD 07/20/2008  . INGUINAL HERNIA, LEFT, SMALL 07/20/2008  . OTH VENTRAL HERN W/O MENTION OBSTRUCTION/GANGREN 07/20/2008  . CONSTIPATION 07/20/2008  . DYSPHAGIA 07/20/2008   Social History   Socioeconomic History  . Marital status: Married    Spouse name: Charles [name]  . Number of children: 3  . Years of education: College  . Highest education level: Not on file  Occupational History  . 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 Financial resource strain: Not on file  . Food insecurity:    Worry: Not on file    Inability: Not on file  . Transportation needs:    Medical: Not on file    Non-medical: Not on file  Tobacco Use  . Smoking status: Never Smoker  . Smokeless tobacco: Never  Used  Substance and Sexual Activity  . Alcohol use: No  . Drug use: No  . Sexual activity: Never  Lifestyle  . Physical activity:    Days per week: Not on file    Minutes per session: Not on file  . Stress: Not on file  Relationships  . Social connections:    Talks on phone: Not on file    Gets together: Not on file    Attends religious service: Not on file    Active member of club or organization: Not on file    Attends meetings of clubs or organizations: Not on file    Relationship status: Not on file  . Intimate partner violence:    Fear of current or ex partner: Not on file    Emotionally abused: Not on file    Physically abused: Not on file    Forced sexual activity: Not on file  Other Topics Concern  . Not on file  Social History Narrative   Patient is married Charles [name]).   Patient is a retired Arboriculturist.   Patient lives in [facility] facility.   Patient has three adult children.   Patient does not drink any caffeine.   Patient is right-handed.    Mr. Dierks family history includes Arthritis-Osteo in his brother; CAD in his mother; Prostate cancer in his father.      Objective:    Vitals:   02/28/18 0945  BP: 120/63  Pulse: 61    Physical Exam ; well-developed elderly white male in no acute distress, accompanied by his wife both very pleasant blood pressure 120/63 pulse 61, height 5 foot 6, weight 176, BMI 28.0.  HEENT; nontraumatic normocephalic EOMI PERRLA sclera anicteric, Cardiovascular regular rate and rhythm with S1-S2 no murmur rub or gallop, Pulmonary; clear bilaterally, Abdomen; soft, nontender nondistended bowel sounds are active there is no palpable mass or hepatosplenomegaly, Rectal; exam was not done, patient showed me a picture on his phone of a very dark blackish maroon bowel movement with clots.  Extremities ;no clubbing cyanosis or edema skin warm and dry, Neuro psych; mood and affect appropriate      Assessment & Plan:   #57;  85 year old white male with ongoing frequent intermittent rectal bleeding with very dark to maroonish stools with clots over the past couple of months.  He has had a 2 g drop in his hemoglobin over the past couple of months.  Exline He has previously  documented radiation proctitis and an AVM in the ascending colon either or both of which may be responsible for his ongoing bleeding. Also consider small bowel source for bleeding i.e. AVMs.  #2 anemia secondary to above #3.  Large hiatal hernia/short segment Barrett's #4 sleep apnea #5.  Hypertension #6.  Diverticulosis and history of diverticular bleeding #7.  Prostate CA status post radiation  Plan; repeat CBC today, will need to keep a close eye on his blood counts over the next couple of months with at least every 2 weeks CBCs, Patient will be scheduled for colonoscopy with APC of the radiation proctitis and the ascending colon AVM next week at [facility] with Dr. Fuller Plan.  (Thursday,8:30 AM 03/07/2018) Procedure was discussed in detail with the patient including indications risks and benefits and he is agreeable to proceed.  If patient continues to manifest bleeding after APC would consider capsule endoscopy.  Avik Leoni Genia Harold PA-C 02/28/2018   Cc: Leanna Battles, MD

## 2018-02-28 NOTE — Patient Instructions (Signed)
Please go to the basement level to have your labs drawn.  Take 2 doses of Miralax with water at 4:30 am the morning of the procedure.   You have been scheduled for a colonoscopy. Please follow written instructions given to you at your visit today.  Please pick up your prep supplies at the pharmacy within the next 1-3 days. If you use inhalers (even only as needed), please bring them with you on the day of your procedure. Your physician has requested that you go to www.startemmi.com and enter the access code given to you at your visit today. This web site gives a general overview about your procedure. However, you should still follow specific instructions given to you by our office regarding your preparation for the procedure.  If you are age 82 or older, your body mass index should be between 23-30. Your Body mass index is 28.01 kg/m. If this is out of the aforementioned range listed, please consider follow up with your Primary Care Provider.

## 2018-02-28 NOTE — Progress Notes (Signed)
Reviewed and agree with management plan. Colonoscopy with possible APC.   Pricilla Riffle. Fuller Plan, MD [facility]

## 2018-03-01 ENCOUNTER — Ambulatory Visit (INDEPENDENT_AMBULATORY_CARE_PROVIDER_SITE_OTHER): Payer: Medicare Other | Admitting: Orthopaedic Surgery

## 2018-03-01 ENCOUNTER — Encounter (INDEPENDENT_AMBULATORY_CARE_PROVIDER_SITE_OTHER): Payer: Self-pay | Admitting: Orthopaedic Surgery

## 2018-03-01 DIAGNOSIS — S98111A Complete traumatic amputation of right great toe, initial encounter: Secondary | ICD-10-CM | POA: Insufficient documentation

## 2018-03-01 DIAGNOSIS — IMO0002 Reserved for concepts with insufficient information to code with codable children: Secondary | ICD-10-CM

## 2018-03-01 DIAGNOSIS — Z89421 Acquired absence of other right toe(s): Secondary | ICD-10-CM

## 2018-03-01 NOTE — Progress Notes (Signed)
Post-Op Visit Note   Patient: Charles [name]           Date of Birth: January 03, 1931           MRN: [id number] Visit Date: 03/01/2018 PCP: Leanna Battles, MD   Assessment & Plan:  Chief Complaint:  Chief Complaint  Patient presents with  . Right Foot - Follow-up   Visit Diagnoses:  1. Toe amputation status, right ([place])     Plan: Hope is a pleasant 84 year old gentleman who presents our clinic today 20 days status post right third toe amputation.  He has been weightbearing as tolerated in a postoperative shoe applying Bactroban and ointment as well as dry dressing changes daily.  Doing well.  No fevers chills or any other systemic symptoms.  Minimal to no pain.  Examination of his toe reveals a well-healing surgical incision with nylon sutures in place.  No evidence of infection or cellulitis.  Today, we will remove the nylon sutures.  He will remain weightbearing as tolerated in a postoperative shoe for another 3 weeks.  Continue with dry dressing changes in the meantime.  Follow-up with [place] in 3 weeks time for repeat evaluation.  Call with concerns or questions in the meantime.  Follow-Up Instructions: Return in about 3 weeks (around 03/22/2018).   Orders:  No orders of the defined types were placed in this encounter.  No orders of the defined types were placed in this encounter.   Imaging: No results found.  PMFS History: Patient Active Problem List   Diagnosis Date Noted  . Toe amputation status, right ([place]) 03/01/2018  . Diverticulosis of colon with hemorrhage   . Acute blood loss anemia   . History of back surgery 10/03/2017  . Rectal bleeding 10/03/2017  . Hematochezia   . Lower GI bleeding 09/29/2017  . Treatment-emergent central sleep apnea 06/25/2017  . Chronic bilateral low back pain with bilateral sciatica 01/17/2017  . Post laminectomy syndrome 01/17/2017  . Chronic pain syndrome 01/17/2017  . Sacroiliitis ([place]) 01/17/2017  . Piriformis syndrome of  right side 01/17/2017  . Malignant neoplasm of prostate ([place]) 07/19/2016  . Fixation hardware in spine 05/15/2016  . Hypoglycemic reaction 02/02/2015  . Tremor, essential 12/11/2014  . OSA on CPAP 04/24/2014  . 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 Hypersomnia with sleep apnea, unspecified 04/24/2014  . Depression 04/18/2013  . BPH (benign prostatic hyperplasia) 04/18/2013  . Impingement syndrome of right shoulder 04/14/2013    Class: Acute  . Scoliosis or kyphoscoliosis 04/08/2013    Class: Chronic  . Lumbosacral spondylosis without myelopathy 04/08/2013    Class: Chronic  . Acute respiratory failure ([place]) 04/08/2013  . Essential hypertension 04/08/2013  . Lymphocytosis   . HIATAL HERNIA 11/21/2010  . DIVERTICULAR DISEASE 11/21/2010  . BARRETT'S ESOPHAGUS 02/22/2010  . GERD 07/20/2008  . INGUINAL HERNIA, LEFT, SMALL 07/20/2008  . OTH VENTRAL HERN W/O MENTION OBSTRUCTION/GANGREN 07/20/2008  . CONSTIPATION 07/20/2008  . DYSPHAGIA 07/20/2008   Past Medical History:  Diagnosis Date  . Anemia   . Arthritis   . AVM (arteriovenous malformation)   . Barrett's esophagus   . BPH (benign prostatic hyperplasia)   . CAD (coronary artery disease)    only on stress test; never had MI, PCI or CABG.  In 2008, LVEF was reportedly normal. pt. denies  . Cataract   . Constipation   . CPAP (continuous positive airway pressure) dependence   . Depression   . Diverticulosis    diverticular bleeding  .  Diverticulosis   . GERD (gastroesophageal reflux disease)   . History of radiation therapy   . Hyperlipidemia   . Hypoglycemia   . Lymphocytosis 06/2012  . Mastodynia   . Murmur    slight  . Nasal congestion   . Osteoporosis   . Osteoporosis   . Prostate cancer ([place])   . Sleep apnea    wears CPAP  . Urinary retention with incomplete bladder emptying    pt uses bathroom and within 30 mins needs to go again  . Vitamin D deficiency     Family History  Problem Relation Age of Onset   . Prostate cancer Father   . CAD Mother   . Arthritis-Osteo Brother   . Colon cancer Neg Hx   . Stomach cancer Neg Hx     Past Surgical History:  Procedure Laterality Date  . AMPUTATION TOE Right 09/20/2017   Procedure: Right 4th toe amputation;  Surgeon: Wylene Simmer, MD;  Location: [facility];  Service: Orthopedics;  Laterality: Right;  foot block  . BACK SURGERY  04/2013  . back surgury     f  . BLEPHAROPLASTY Bilateral 02/23/15  . CATARACT EXTRACTION    . COLONOSCOPY    . COLONOSCOPY N/A 10/02/2017   Procedure: COLONOSCOPY;  Surgeon: Doran Stabler, MD;  Location: [facility];  Service: Gastroenterology;  Laterality: N/A;  . COLONOSCOPY WITH PROPOFOL N/A 10/01/2017   Procedure: COLONOSCOPY WITH PROPOFOL;  Surgeon: Doran Stabler, MD;  Location: [facility];  Service: Gastroenterology;  Laterality: N/A;  . ESOPHAGOGASTRODUODENOSCOPY (EGD) WITH PROPOFOL N/A 10/01/2017   Procedure: ESOPHAGOGASTRODUODENOSCOPY (EGD) WITH PROPOFOL;  Surgeon: Doran Stabler, MD;  Location: [facility];  Service: Gastroenterology;  Laterality: N/A;  . EYE SURGERY Bilateral   . FINGER SURGERY Right    right thumb  . HARDWARE REMOVAL Right 05/15/2016   Procedure: Right Lumbar Two-Lumbar Five Removal of Hardware;  Surgeon: Kristeen Miss, MD;  Location: [facility];  Service: Neurosurgery;  Laterality: Right;  Right L2-5 removal of hardware  . INGUINAL HERNIA REPAIR     x4  . REPAIR SPIGELIAN HERNIA  2011  . [place] ,2014  . TONSILLECTOMY    . UPPER GASTROINTESTINAL ENDOSCOPY    . VASECTOMY     Social History   Occupational History  . 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 Smoking status: Never Smoker  . Smokeless tobacco: Never Used  Substance and Sexual Activity  . Alcohol use: No  . Drug use: No  . Sexual activity: Never

## 2018-03-06 NOTE — Anesthesia Preprocedure Evaluation (Addendum)
Anesthesia Evaluation  Patient identified by MRN, date of birth, ID band Patient awake    Reviewed: Allergy & Precautions, NPO status , Patient's Chart, lab work & pertinent test results, reviewed documented beta blocker date and time   Airway Mallampati: II  TM Distance: >3 FB Neck ROM: Full    Dental no notable dental hx. (+) Teeth Intact   Pulmonary sleep apnea and Continuous Positive Airway Pressure Ventilation ,    Pulmonary exam normal breath sounds clear to auscultation       Cardiovascular Exercise Tolerance: Poor hypertension, Pt. on home beta blockers and Pt. on medications + CAD  Normal cardiovascular exam+ Valvular Problems/Murmurs  Rhythm:Regular Rate:Normal     Neuro/Psych PSYCHIATRIC DISORDERS Depression  Neuromuscular disease    GI/Hepatic Neg liver ROS, GERD  Medicated and Controlled,Rectal Bleeding Radiation Proctitis Hx/o Diverticular disease   Endo/Other  negative endocrine ROSHyperlipidemia  Renal/GU negative Renal ROS   Hx/o Prostate Ca- S/P RT    Musculoskeletal  (+) Arthritis , Osteoarthritis,  Hx/o Osteomyelitis Scoliosis Kyphoscoliosis   Abdominal   Peds  Hematology  (+) anemia ,   Anesthesia Other Findings   Reproductive/Obstetrics                           Anesthesia Physical Anesthesia Plan  ASA: III  Anesthesia Plan: MAC   Post-op Pain Management:    Induction:   PONV Risk Score and Plan:   Airway Management Planned: Natural Airway, Nasal Cannula and Simple Face Mask  Additional Equipment:   Intra-op Plan:   Post-operative Plan:   Informed Consent: I have reviewed the patients History and Physical, chart, labs and discussed the procedure including the risks, benefits and alternatives for the proposed anesthesia with the patient or authorized representative who has indicated his/her understanding and acceptance.   Dental advisory  given  Plan Discussed with: CRNA, Anesthesiologist and Surgeon  Anesthesia Plan Comments:        Anesthesia Quick Evaluation

## 2018-03-07 ENCOUNTER — Ambulatory Visit (HOSPITAL_COMMUNITY): Payer: Medicare Other | Admitting: Anesthesiology

## 2018-03-07 ENCOUNTER — Ambulatory Visit (HOSPITAL_COMMUNITY)
Admission: RE | Admit: 2018-03-07 | Discharge: 2018-03-07 | Disposition: A | Discharge status: Home / Self Care | Payer: Medicare Other | Source: Ambulatory Visit | Attending: Gastroenterology | Admitting: Gastroenterology

## 2018-03-07 ENCOUNTER — Other Ambulatory Visit: Payer: Self-pay

## 2018-03-07 ENCOUNTER — Encounter (HOSPITAL_COMMUNITY)
Admission: RE | Disposition: A | Discharge status: Home / Self Care | Payer: Self-pay | Source: Ambulatory Visit | Attending: Gastroenterology

## 2018-03-07 ENCOUNTER — Encounter (HOSPITAL_COMMUNITY): Payer: Self-pay

## 2018-03-07 DIAGNOSIS — K625 Hemorrhage of anus and rectum: Secondary | ICD-10-CM

## 2018-03-07 DIAGNOSIS — D649 Anemia, unspecified: Secondary | ICD-10-CM

## 2018-03-07 DIAGNOSIS — R011 Cardiac murmur, unspecified: Secondary | ICD-10-CM | POA: Insufficient documentation

## 2018-03-07 DIAGNOSIS — I2510 Atherosclerotic heart disease of native coronary artery without angina pectoris: Secondary | ICD-10-CM | POA: Insufficient documentation

## 2018-03-07 DIAGNOSIS — G4733 Obstructive sleep apnea (adult) (pediatric): Secondary | ICD-10-CM | POA: Diagnosis not present

## 2018-03-07 DIAGNOSIS — I10 Essential (primary) hypertension: Secondary | ICD-10-CM | POA: Insufficient documentation

## 2018-03-07 DIAGNOSIS — G4752 REM sleep behavior disorder: Secondary | ICD-10-CM | POA: Insufficient documentation

## 2018-03-07 DIAGNOSIS — Z8249 Family history of ischemic heart disease and other diseases of the circulatory system: Secondary | ICD-10-CM | POA: Insufficient documentation

## 2018-03-07 DIAGNOSIS — K5730 Diverticulosis of large intestine without perforation or abscess without bleeding: Secondary | ICD-10-CM

## 2018-03-07 DIAGNOSIS — K5520 Angiodysplasia of colon without hemorrhage: Secondary | ICD-10-CM | POA: Diagnosis not present

## 2018-03-07 DIAGNOSIS — K5521 Angiodysplasia of colon with hemorrhage: Secondary | ICD-10-CM | POA: Diagnosis not present

## 2018-03-07 DIAGNOSIS — M419 Scoliosis, unspecified: Secondary | ICD-10-CM | POA: Insufficient documentation

## 2018-03-07 DIAGNOSIS — Z9989 Dependence on other enabling machines and devices: Secondary | ICD-10-CM | POA: Insufficient documentation

## 2018-03-07 DIAGNOSIS — Z8546 Personal history of malignant neoplasm of prostate: Secondary | ICD-10-CM | POA: Diagnosis not present

## 2018-03-07 DIAGNOSIS — Z888 Allergy status to other drugs, medicaments and biological substances status: Secondary | ICD-10-CM | POA: Insufficient documentation

## 2018-03-07 DIAGNOSIS — K921 Melena: Secondary | ICD-10-CM | POA: Diagnosis present

## 2018-03-07 DIAGNOSIS — K219 Gastro-esophageal reflux disease without esophagitis: Secondary | ICD-10-CM | POA: Diagnosis not present

## 2018-03-07 DIAGNOSIS — N400 Enlarged prostate without lower urinary tract symptoms: Secondary | ICD-10-CM | POA: Insufficient documentation

## 2018-03-07 DIAGNOSIS — Z923 Personal history of irradiation: Secondary | ICD-10-CM | POA: Insufficient documentation

## 2018-03-07 DIAGNOSIS — Z79899 Other long term (current) drug therapy: Secondary | ICD-10-CM | POA: Insufficient documentation

## 2018-03-07 DIAGNOSIS — F329 Major depressive disorder, single episode, unspecified: Secondary | ICD-10-CM | POA: Insufficient documentation

## 2018-03-07 DIAGNOSIS — Q2733 Arteriovenous malformation of digestive system vessel: Secondary | ICD-10-CM | POA: Diagnosis not present

## 2018-03-07 DIAGNOSIS — K6289 Other specified diseases of anus and rectum: Secondary | ICD-10-CM | POA: Diagnosis not present

## 2018-03-07 DIAGNOSIS — K5731 Diverticulosis of large intestine without perforation or abscess with bleeding: Secondary | ICD-10-CM | POA: Diagnosis not present

## 2018-03-07 DIAGNOSIS — K627 Radiation proctitis: Secondary | ICD-10-CM

## 2018-03-07 DIAGNOSIS — M1990 Unspecified osteoarthritis, unspecified site: Secondary | ICD-10-CM | POA: Diagnosis not present

## 2018-03-07 DIAGNOSIS — K5900 Constipation, unspecified: Secondary | ICD-10-CM | POA: Diagnosis not present

## 2018-03-07 HISTORY — DX: Anemia, unspecified: D64.9

## 2018-03-07 HISTORY — PX: COLONOSCOPY WITH PROPOFOL: SHX5780

## 2018-03-07 SURGERY — COLONOSCOPY WITH PROPOFOL
Anesthesia: Monitor Anesthesia Care

## 2018-03-07 MED ORDER — PROPOFOL 10 MG/ML IV BOLUS
INTRAVENOUS | Status: AC
Start: 2018-03-07 — End: ?
  Filled 2018-03-07: qty 20

## 2018-03-07 MED ORDER — PROPOFOL 500 MG/50ML IV EMUL
INTRAVENOUS | Status: DC | PRN
  Administered 2018-03-07: 125 ug/kg/min via INTRAVENOUS

## 2018-03-07 MED ORDER — LACTATED RINGERS IV SOLN
INTRAVENOUS | Status: DC
  Administered 2018-03-07: 08:00:00 via INTRAVENOUS

## 2018-03-07 MED ORDER — PROPOFOL 10 MG/ML IV BOLUS
INTRAVENOUS | Status: AC
Start: 2018-03-07 — End: ?
  Filled 2018-03-07: qty 40

## 2018-03-07 MED ORDER — SODIUM CHLORIDE 0.9 % IV SOLN: INTRAVENOUS | Status: DC

## 2018-03-07 MED ORDER — ONDANSETRON HCL 4 MG/2ML IJ SOLN
INTRAMUSCULAR | Status: DC | PRN
  Administered 2018-03-07: 4 mg via INTRAVENOUS

## 2018-03-07 MED ORDER — PROPOFOL 500 MG/50ML IV EMUL
INTRAVENOUS | Status: DC | PRN
  Administered 2018-03-07: 40 mg via INTRAVENOUS

## 2018-03-07 SURGICAL SUPPLY — 22 items

## 2018-03-07 NOTE — Anesthesia Postprocedure Evaluation (Addendum)
Anesthesia Post Note  Patient: Charles [name]  Procedure(s) Performed: COLONOSCOPY WITH PROPOFOL (N/A )     Patient location during evaluation: PACU Anesthesia Type: MAC Level of consciousness: awake and alert and oriented Pain management: pain level controlled Vital Signs Assessment: post-procedure vital signs reviewed and stable Respiratory status: spontaneous breathing, nonlabored ventilation and respiratory function stable Cardiovascular status: stable and blood pressure returned to baseline Postop Assessment: no apparent nausea or vomiting Anesthetic complications: no    Last Vitals:  Vitals:   03/07/18 1000 03/07/18 1005  BP: (!) 122/37   Pulse: (!) 56 (!) 57  Resp: 16 17  Temp:    SpO2: 97% 98%    Last Pain:  Vitals:   03/07/18 1000  TempSrc:   PainSc: 0-No pain                 Jacqulene Huntley A.

## 2018-03-07 NOTE — Discharge Instructions (Signed)

## 2018-03-07 NOTE — Interval H&P Note (Signed)
History and Physical Interval Note:  03/07/2018 8:37 AM  Charles [name]  has presented today for surgery, with the diagnosis of RECTAL BLEEDING  AND ANEMIA AND COLONIC AVM  The various methods of treatment have been discussed with the patient and family. After consideration of risks, benefits and other options for treatment, the patient has consented to  Procedure(s): COLONOSCOPY WITH PROPOFOL (N/A) as a surgical intervention .  The patient's history has been reviewed, patient examined, no change in status, stable for surgery.  I have reviewed the patient's chart and labs.  Questions were answered to the patient's satisfaction.     Pricilla Riffle. Fuller Plan

## 2018-03-07 NOTE — Op Note (Signed)
[facility] Patient Name: Charles [name] Procedure Date: 03/07/2018 MRN: [id number] Attending MD: [name] , MD Date of Birth: 04-Mar-1931 CSN: [id number] Age: 84 Admit Type: Inpatient Procedure:                Colonoscopy Indications:              Hematochezia Providers:                Pricilla Riffle. Fuller Plan, MD, Burtis Junes, RN, William Dalton, Technician Referring MD:             Ermalene Searing. Philip Aspen, MD Medicines:                Monitored Anesthesia Care Complications:            No immediate complications. Estimated blood loss:                            None. Estimated Blood Loss:     Estimated blood loss: none. Procedure:                Pre-Anesthesia Assessment:                           - Prior to the procedure, a History and Physical                            was performed, and patient medications and                            allergies were reviewed. The patient's tolerance of                            previous anesthesia was also reviewed. The risks                            and benefits of the procedure and the sedation                            options and risks were discussed with the patient.                            All questions were answered, and informed consent                            was obtained. Prior Anticoagulants: The patient has                            taken no previous anticoagulant or antiplatelet                            agents. ASA Grade Assessment: III - A patient with                            severe systemic  disease. After reviewing the risks                            and benefits, the patient was deemed in                            satisfactory condition to undergo the procedure.                           After obtaining informed consent, the colonoscope                            was passed under direct vision. Throughout the                            procedure, the patient's blood  pressure, pulse, and                            oxygen saturations were monitored continuously. The                            EC-[id number] ([id number]) scope was introduced through                            the anus and advanced to the the cecum, identified                            by appendiceal orifice and ileocecal valve. The                            ileocecal valve, appendiceal orifice, and rectum                            were photographed. The quality of the bowel                            preparation was fair. The colonoscopy was performed                            without difficulty. The patient tolerated the                            procedure well. Scope In: 8:54:47 AM Scope Out: 9:23:32 AM Scope Withdrawal Time: 0 hours 19 minutes 57 seconds  Total Procedure Duration: 0 hours 28 minutes 45 seconds  Findings:      The perianal and digital rectal examinations were normal.      A single medium-sized localized angiodysplastic lesion without bleeding       was found in the ascending colon. Coagulation for bleeding prevention       using argon plasma was successful.      Multiple small-mouthed diverticula were found in the left colon and       right colon.      Multiple small localized angiodysplastic lesions with mild bleeding were       found in the  rectum c/w radiation proctitis. Coagulation for hemostasis       of bleeding caused by the procedure using argon plasma was successful.      The exam was otherwise without abnormality on direct and retroflexion       views. Impression:               - Preparation of the colon was fair.                           - A single non-bleeding colonic angiodysplastic                            lesion. Treated with argon plasma coagulation (APC).                           - Diverticulosis in the left colon and in the right                            colon.                           - Multiple bleeding rectum angiodysplastic lesions                             c/w radiation proctitis. Treated with argon plasma                            coagulation (APC).                           - The examination was otherwise normal on direct                            and retroflexion views.                           - No specimens collected. Moderate Sedation:      N/A- Per Anesthesia Care Recommendation:           - Patient has a contact number available for                            emergencies. The signs and symptoms of potential                            delayed complications were discussed with the                            patient. Return to normal activities tomorrow.                            Written discharge instructions were provided to the                            patient.                           -  Resume previous diet.                           - Continue present medications.                           - No aspirin, ibuprofen, naproxen, or other                            non-steroidal anti-inflammatory drugs for 4 weeks.                           - Assess response to APC. Procedure Code(s):        --- Professional ---                           [phone], Colonoscopy, flexible; with control of                            bleeding, any method Diagnosis Code(s):        --- Professional ---                           K55.21, Angiodysplasia of colon with hemorrhage                           K92.1, Melena (includes Hematochezia)                           K57.30, Diverticulosis of large intestine without                            perforation or abscess without bleeding CPT copyright 2017 American Medical Association. All rights reserved. The codes documented in this report are preliminary and upon coder review may  be revised to meet current compliance requirements. [name], MD 03/07/2018 9:33:38 AM This report has been signed electronically. Number of Addenda: 0

## 2018-03-07 NOTE — Transfer of Care (Signed)
Immediate Anesthesia Transfer of Care Note  Patient: Charles [name]  Procedure(s) Performed: COLONOSCOPY WITH PROPOFOL (N/A )  Patient Location: PACU  Anesthesia Type:MAC  Level of Consciousness: drowsy, patient cooperative and responds to stimulation  Airway & Oxygen Therapy: Patient Spontanous Breathing and Patient connected to face mask oxygen  Post-op Assessment: Report given to RN and Post -op Vital signs reviewed and stable  Post vital signs: Reviewed and stable  Last Vitals:  Vitals Value Taken Time  BP    Temp    Pulse 47 03/07/2018  9:34 AM  Resp 14 03/07/2018  9:34 AM  SpO2 96 % 03/07/2018  9:34 AM  Vitals shown include unvalidated device data.  Last Pain:  Vitals:   03/07/18 0728  TempSrc: Oral  PainSc: 0-No pain         Complications: No apparent anesthesia complications

## 2018-03-08 ENCOUNTER — Encounter (HOSPITAL_COMMUNITY): Payer: Self-pay | Admitting: Gastroenterology

## 2018-03-08 ENCOUNTER — Telehealth: Payer: Self-pay | Admitting: Gastroenterology

## 2018-03-08 NOTE — Telephone Encounter (Signed)
Spoke with pts wife and she is aware. 

## 2018-03-08 NOTE — Telephone Encounter (Signed)
Pt had colon at the hospital yesterday. Pts wife is calling stating pt had some diarrhea last night. This morning she states he seemed disoriented and shaky. He felt warm to touch and she took his temp twice and he has a temp of 101. Pt states she was notified to call if he had a fever. Please advise.

## 2018-03-08 NOTE — Telephone Encounter (Signed)
If he is not having abdominal pain, N,V the fever is unlikely to be procedures related. The procedure should not cause diarrhea. Advise them to contact his PCP today to further evaluate fever, disorientation.

## 2018-03-12 DIAGNOSIS — Z79899 Other long term (current) drug therapy: Secondary | ICD-10-CM | POA: Diagnosis not present

## 2018-03-12 DIAGNOSIS — Z6828 Body mass index (BMI) 28.0-28.9, adult: Secondary | ICD-10-CM | POA: Diagnosis not present

## 2018-03-12 DIAGNOSIS — K625 Hemorrhage of anus and rectum: Secondary | ICD-10-CM | POA: Diagnosis not present

## 2018-03-12 DIAGNOSIS — M810 Age-related osteoporosis without current pathological fracture: Secondary | ICD-10-CM | POA: Diagnosis not present

## 2018-03-12 LAB — BASIC METABOLIC PANEL
BUN: 19 (ref 4–21)
CREATININE: 0.8 (ref 0.6–1.3)
Glucose: 83
POTASSIUM: 4.9 (ref 3.4–5.3)
SODIUM: 138 (ref 137–147)

## 2018-03-12 LAB — CBC AND DIFFERENTIAL
HEMATOCRIT: 32 — AB (ref 41–53)
HEMOGLOBIN: 10.4 — AB (ref 13.5–17.5)
Platelets: 203 (ref 150–399)
WBC: 5.4

## 2018-03-19 ENCOUNTER — Encounter (INDEPENDENT_AMBULATORY_CARE_PROVIDER_SITE_OTHER): Payer: Self-pay | Admitting: Orthopaedic Surgery

## 2018-03-19 ENCOUNTER — Ambulatory Visit (INDEPENDENT_AMBULATORY_CARE_PROVIDER_SITE_OTHER): Payer: Medicare Other | Admitting: Orthopaedic Surgery

## 2018-03-19 VITALS — Ht 66.5 in | Wt 176.0 lb

## 2018-03-19 DIAGNOSIS — M86171 Other acute osteomyelitis, right ankle and foot: Secondary | ICD-10-CM

## 2018-03-19 DIAGNOSIS — IMO0002 Reserved for concepts with insufficient information to code with codable children: Secondary | ICD-10-CM

## 2018-03-19 NOTE — Progress Notes (Signed)
Patient is status post partial third toe amputation.  He is doing well.  Surgical wound is fully healed.  The provided him with a toe sleeve for the second toe to avoid ulceration on the dorsum.  He has no pain.  My standpoint for at least 2 full activity.  Questions encouraged and answered.  Follow-up

## 2018-03-20 ENCOUNTER — Ambulatory Visit: Payer: Medicare Other | Admitting: Neurology

## 2018-04-02 ENCOUNTER — Telehealth: Payer: Self-pay | Admitting: Neurology

## 2018-04-02 NOTE — Telephone Encounter (Signed)
[facility] updated me on the status of Charles [name] with his new machine.  "This is a patient of Charles [name], I called on 02/07/2018, no answer LVM, I called again on 02/11/2018 no answer LVM, patient returned my call on 03/11 pt returned my call, we went over financials and he ask that I call him once we move locations. On 02/28/2018 I called the patient to follow up, no answer LVM. On 04/01/2018, Renaee Munda spoke with the patients wife and she advised he was not available but she would have him give [place] a call back."  Pt is now scheduled on may 3rd 2019 to get machine. I will need to call the pt to move his apt out further to account for 60 days of use.

## 2018-04-11 ENCOUNTER — Ambulatory Visit: Payer: Medicare Other | Admitting: Neurology

## 2018-04-30 DIAGNOSIS — Z6827 Body mass index (BMI) 27.0-27.9, adult: Secondary | ICD-10-CM | POA: Diagnosis not present

## 2018-04-30 DIAGNOSIS — F3289 Other specified depressive episodes: Secondary | ICD-10-CM | POA: Diagnosis not present

## 2018-04-30 DIAGNOSIS — K625 Hemorrhage of anus and rectum: Secondary | ICD-10-CM | POA: Diagnosis not present

## 2018-04-30 DIAGNOSIS — M545 Low back pain: Secondary | ICD-10-CM | POA: Diagnosis not present

## 2018-04-30 DIAGNOSIS — I2510 Atherosclerotic heart disease of native coronary artery without angina pectoris: Secondary | ICD-10-CM | POA: Diagnosis not present

## 2018-04-30 LAB — CBC AND DIFFERENTIAL
HCT: 38 — AB (ref 41–53)
Hemoglobin: 11.9 — AB (ref 13.5–17.5)
Platelets: 193 (ref 150–399)
WBC: 5.4

## 2018-05-09 DIAGNOSIS — H35373 Puckering of macula, bilateral: Secondary | ICD-10-CM | POA: Diagnosis not present

## 2018-05-10 ENCOUNTER — Telehealth: Payer: Self-pay | Admitting: Gastroenterology

## 2018-05-10 NOTE — Telephone Encounter (Signed)
Pt states he is still seeing blood in his stools and is still having frequent stools. Requesting to be seen. Pt scheduled to see Amy Esterwood PA 05/17/18@2 :30pm, pt aware of appt.

## 2018-05-13 DIAGNOSIS — R269 Unspecified abnormalities of gait and mobility: Secondary | ICD-10-CM | POA: Diagnosis not present

## 2018-05-13 DIAGNOSIS — M542 Cervicalgia: Secondary | ICD-10-CM | POA: Diagnosis not present

## 2018-05-13 DIAGNOSIS — M545 Low back pain: Secondary | ICD-10-CM | POA: Diagnosis not present

## 2018-05-15 DIAGNOSIS — M542 Cervicalgia: Secondary | ICD-10-CM | POA: Diagnosis not present

## 2018-05-15 DIAGNOSIS — R269 Unspecified abnormalities of gait and mobility: Secondary | ICD-10-CM | POA: Diagnosis not present

## 2018-05-15 DIAGNOSIS — M545 Low back pain: Secondary | ICD-10-CM | POA: Diagnosis not present

## 2018-05-17 ENCOUNTER — Other Ambulatory Visit (INDEPENDENT_AMBULATORY_CARE_PROVIDER_SITE_OTHER): Payer: Medicare Other

## 2018-05-17 ENCOUNTER — Ambulatory Visit (INDEPENDENT_AMBULATORY_CARE_PROVIDER_SITE_OTHER): Payer: Medicare Other | Admitting: Physician Assistant

## 2018-05-17 ENCOUNTER — Encounter: Payer: Self-pay | Admitting: Physician Assistant

## 2018-05-17 VITALS — BP 130/50 | HR 76 | Ht 66.0 in | Wt 172.4 lb

## 2018-05-17 DIAGNOSIS — K627 Radiation proctitis: Secondary | ICD-10-CM

## 2018-05-17 DIAGNOSIS — D649 Anemia, unspecified: Secondary | ICD-10-CM

## 2018-05-17 DIAGNOSIS — K625 Hemorrhage of anus and rectum: Secondary | ICD-10-CM | POA: Diagnosis not present

## 2018-05-17 LAB — CBC WITH DIFFERENTIAL/PLATELET
BASOS ABS: 0 10*3/uL (ref 0.0–0.1)
Basophils Relative: 0.3 % (ref 0.0–3.0)
EOS ABS: 0.1 10*3/uL (ref 0.0–0.7)
Eosinophils Relative: 2 % (ref 0.0–5.0)
HCT: 35.8 % — ABNORMAL LOW (ref 39.0–52.0)
Hemoglobin: 12 g/dL — ABNORMAL LOW (ref 13.0–17.0)
LYMPHS ABS: 0.7 10*3/uL (ref 0.7–4.0)
Lymphocytes Relative: 9.6 % — ABNORMAL LOW (ref 12.0–46.0)
MCHC: 33.4 g/dL (ref 30.0–36.0)
MCV: 87.7 fl (ref 78.0–100.0)
MONO ABS: 0.8 10*3/uL (ref 0.1–1.0)
MONOS PCT: 10.9 % (ref 3.0–12.0)
NEUTROS ABS: 5.6 10*3/uL (ref 1.4–7.7)
NEUTROS PCT: 77.2 % — AB (ref 43.0–77.0)
Platelets: 197 10*3/uL (ref 150.0–400.0)
RBC: 4.08 Mil/uL — AB (ref 4.22–5.81)
RDW: 15.6 % — ABNORMAL HIGH (ref 11.5–15.5)
WBC: 7.3 10*3/uL (ref 4.0–10.5)

## 2018-05-17 MED ORDER — MESALAMINE 1000 MG RE SUPP: 1000.0000 mg | Freq: Every day | RECTAL | 3 refills | Status: DC

## 2018-05-17 NOTE — Progress Notes (Signed)
Subjective:    Patient ID: Charles [name], male    DOB: June 23, 1931, 84 y.o.   MRN: [id number]  HPI Charles [name] is a pleasant 84 year old white male, known to Dr. Fuller Plan who comes in today for follow-up.  He was last seen in the office in March 2019 with ongoing complaints of rectal bleeding.  He has history of prostate cancer and is status post radiation.  Also prior history of diverticular bleeding, GERD with Barrett's esophagus, sleep apnea and hypertension.  He had a hospitalization in October 2018 with diverticular hemorrhage.  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  Also evidence of radiation proctitis.  EGD at that time showed a large hiatal hernia and short segment Barrett's. 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  Also with significant radiation proctitis which was treated with APC. His hemoglobin in March 2019 was 10.8.  He and his wife say this was repeated about 3 weeks ago by his PCP and up to 11.9. He says he has had less bleeding since the colonoscopy but continues to pass dark stool off and on which is difficult for him to tell how much blood it contains.  He continues to have oozing of small amounts of stool and blood several times per day but not complete incontinence.  He denies any rectal discomfort.  No abdominal discomfort.  Review of Systems Pertinent positive and negative review of systems were noted in the above HPI section.  All other review of systems was otherwise negative.  Outpatient Encounter Medications as of 05/17/2018  Medication Sig  . acetaminophen (TYLENOL) 500 MG tablet Take 1,000 mg by mouth every 6 (six) hours as needed for mild pain. TAKES WITH TRAMADOL  . buPROPion (WELLBUTRIN SR) 150 MG 12 hr tablet Take 150 mg by mouth 2 (two) times daily.   .  carboxymethylcellul-glycerin (REFRESH REPAIR) 0.5-0.9 % ophthalmic solution Place 1 drop into both eyes 3 (three) times daily.  . cholecalciferol (VITAMIN D) 1000 UNITS tablet Take 1,000 Units by mouth daily.  Marland Kitchen docusate sodium (COLACE) 100 MG capsule Take 100 mg by mouth as needed.   . ferrous sulfate 325 (65 FE) MG EC tablet Take 325 mg by mouth daily with breakfast.  . gabapentin (NEURONTIN) 300 MG capsule Take 1 tablet PO at 4 PM  and 1 tablet at 10 PM (Patient taking differently: Take 300-600 mg by mouth See admin instructions. Take 300 mg by mouth at 0800, 600 mg by mouth at 1600 and take 600 mg by mouth at 2200)  . ipratropium (ATROVENT) 0.03 % nasal spray Place 2 sprays into both nostrils every 12 (twelve) hours.  . lovastatin (MEVACOR) 20 MG tablet Take 20 mg by mouth at bedtime.   . Magnesium 250 MG TABS Take 250 mg by mouth every evening.   . magnesium hydroxide (MILK OF MAGNESIA) 400 MG/5ML suspension Take 5 mLs by mouth at bedtime as needed for mild constipation.  . methocarbamol (ROBAXIN) 500 MG tablet Take 500 mg by mouth every 8 (eight) hours as needed for muscle spasms.  . Multiple Minerals-Vitamins (CITRACAL PLUS BONE DENSITY) TABS Take 1 tablet by mouth 2 (two) times daily.   . Multiple Vitamins-Minerals (CENTRUM ADULTS PO) Take 1 tablet by mouth daily.  . Multiple Vitamins-Minerals (PRESERVISION AREDS 2 PO) Take 1 capsule by mouth  2 (two) times daily.  Marland Kitchen omeprazole (PRILOSEC) 40 MG capsule TAKE 1 CAPSULE(40 MG) BY MOUTH TWICE DAILY  . propranolol (INDERAL) 10 MG tablet Take 10 mg by mouth daily.   . ranitidine (ZANTAC) 300 MG tablet TAKE 1 TABLET(300 MG) BY MOUTH AT BEDTIME  . Tamsulosin HCl (FLOMAX) 0.4 MG CAPS Take 0.4 mg by mouth 2 (two) times daily.   . traMADol (ULTRAM) 50 MG tablet Take 2 tablets (100 mg total) by mouth 2 (two) times daily as needed for moderate pain.  Marland Kitchen triamcinolone cream (KENALOG) 0.1 % Apply 1 application topically daily as needed (for rash).   .  zoledronic acid (RECLAST) 5 MG/100ML SOLN injection Inject 5 mg into the vein once.   . mesalamine (CANASA) 1000 MG suppository Place 1 suppository (1,000 mg total) rectally at bedtime.   No facility-administered encounter medications on file as of 05/17/2018.    Allergies  Allergen Reactions  . Celebrex [Celecoxib] Other (See Comments)    Created stomach ulcers.  Marland Kitchen Flexeril [Cyclobenzaprine] Other (See Comments)    Caught in hiatal hernia and caused extreme pain   Patient Active Problem List   Diagnosis Date Noted  . AVM (arteriovenous malformation) of colon   . Radiation proctitis   . Toe amputation status, right ([place]) 03/01/2018  . Diverticulosis of colon with hemorrhage   . Acute blood loss anemia   . History of back surgery 10/03/2017  . Rectal bleeding 10/03/2017  . Hematochezia   . Lower GI bleeding 09/29/2017  . Treatment-emergent central sleep apnea 06/25/2017  . Chronic bilateral low back pain with bilateral sciatica 01/17/2017  . Post laminectomy syndrome 01/17/2017  . Chronic pain syndrome 01/17/2017  . Sacroiliitis ([place]) 01/17/2017  . Piriformis syndrome of right side 01/17/2017  . Malignant neoplasm of prostate ([place]) 07/19/2016  . Fixation hardware in spine 05/15/2016  . Hypoglycemic reaction 02/02/2015  . Tremor, essential 12/11/2014  . OSA on CPAP 04/24/2014  . 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 Hypersomnia with sleep apnea, unspecified 04/24/2014  . Depression 04/18/2013  . BPH (benign prostatic hyperplasia) 04/18/2013  . Impingement syndrome of right shoulder 04/14/2013    Class: Acute  . Scoliosis or kyphoscoliosis 04/08/2013    Class: Chronic  . Lumbosacral spondylosis without myelopathy 04/08/2013    Class: Chronic  . Acute respiratory failure ([place]) 04/08/2013  . Essential hypertension 04/08/2013  . Lymphocytosis   . HIATAL HERNIA 11/21/2010  . DIVERTICULAR DISEASE 11/21/2010  . BARRETT'S ESOPHAGUS 02/22/2010  . GERD 07/20/2008  . INGUINAL  HERNIA, LEFT, SMALL 07/20/2008  . OTH VENTRAL HERN W/O MENTION OBSTRUCTION/GANGREN 07/20/2008  . CONSTIPATION 07/20/2008  . DYSPHAGIA 07/20/2008   Social History   Socioeconomic History  . Marital status: Married    Spouse name: Charles [name]  . Number of children: 3  . Years of education: College  . Highest education level: Not on file  Occupational History  . 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 Financial resource strain: Not on file  . Food insecurity:    Worry: Not on file    Inability: Not on file  . Transportation needs:    Medical: Not on file    Non-medical: Not on file  Tobacco Use  . Smoking status: Never Smoker  . Smokeless tobacco: Never Used  Substance and Sexual Activity  . Alcohol use: No  . Drug use: No  . Sexual activity: Never  Lifestyle  . Physical activity:  Days per week: Not on file    Minutes per session: Not on file  . Stress: Not on file  Relationships  . Social connections:    Talks on phone: Not on file    Gets together: Not on file    Attends religious service: Not on file    Active member of club or organization: Not on file    Attends meetings of clubs or organizations: Not on file    Relationship status: Not on file  . Intimate partner violence:    Fear of current or ex partner: Not on file    Emotionally abused: Not on file    Physically abused: Not on file    Forced sexual activity: Not on file  Other Topics Concern  . Not on file  Social History Narrative   Patient is married Charles [name]).   Patient is a retired Arboriculturist.   Patient lives in [place] living facility.   Patient has three adult children.   Patient does not drink any caffeine.   Patient is right-handed.    Charles [name] family history includes Arthritis-Osteo in his brother; CAD in his mother; Prostate cancer in his father.      Objective:    Vitals:   05/17/18 1416  BP: (!) 130/50  Pulse: 76     Physical Exam; well-developed elderly white male in no acute distress, ambulates with a walker.  Accompanied by his wife both very pleasant blood pressure 130/50 pulse 76, height 5 foot 6, weight 172 BMI 27.  HEENT; nontraumatic normocephalic EOMI PERRLA sclera anicteric oropharynx clear, Cardiovascular; regular rate and rhythm with S1-S2 no murmur rub or gallop, Pulmonary; clear bilaterally, Abdomen; soft, nontender nondistended bowel sounds are active there is no palpable mass or hepatosplenomegaly bowel sounds present, Rectal; exam not done, Extremities; no clubbing cyanosis or edema skin warm and dry, Neuro psych; mood and affect appropriate, alert and oriented grossly nonfocal       Assessment & Plan:   #19 [age]-year-old white male with history of prostate cancer status post radiation with radiation proctitis who has had ongoing problems with rectal bleeding and intermittent fecal soilage/oozing of blood. He is status post colonoscopy with APC treatment of the radiation proctitis April 2019 and also had APC of an ascending colon AVM. He has had decrease in bleeding but not resolution.  He continues to have fecal soilage/oozing of dark blood and heme.  This is very likely secondary to residual radiation proctitis  #2 GERD with short segment Barrett's-stable #3.  History of diverticular hemorrhage fall 2018  Plan; repeat hemoglobin today Trial of adding fiber/Benefiber 1 scoop daily in a glass of water to see if bulking stool will help with decreasing fecal seepage Will start a trial of Canasa suppositories at bedtime She will be scheduled for flexible sigmoidoscopy with repeat APC of the radiation proctitis with Dr. Fuller Plan at [facility].  Procedure was discussed in detail with the patient and his wife including indications risks and benefits and he is agreeable to proceed.  Amy Genia Harold PA-C 05/17/2018   Cc: Leanna Battles, MD

## 2018-05-17 NOTE — Patient Instructions (Signed)
Your provider has requested that you go to the basement level for lab work before leaving today. Press "B" on the elevator. The lab is located at the first door on the left as you exit the elevator.  We have sent the following medications to your pharmacy for you to pick up at your convenience: [place].   Start over the counter Benefiber once daily in a glass of water.   You have been scheduled for a flexible sigmoidoscopy. Please follow the written instructions given to you at your visit today. If you use inhalers (even only as needed), please bring them with you on the day of your procedure.

## 2018-05-19 NOTE — Progress Notes (Signed)
Reviewed and agree with management plan. As was discussed previously with the patient and his wife - treatment of radiation proctitis with APC should reduce his rectal bleeding but likely will not completely prevent it. He may continue to have some rectal bleeding even after repeat APC.  Pricilla Riffle. Fuller Plan, MD [facility]

## 2018-05-20 ENCOUNTER — Telehealth: Payer: Self-pay | Admitting: Physician Assistant

## 2018-05-20 NOTE — Telephone Encounter (Signed)
Spoke to wife and she states that the canasa is over $800.00/month and their part would be around $200.00. Unfortunately they cannot afford this. I asked her if they had gotten the fiber/Benefiber supplement and they have not pick it up yet, but she was told to give Amy a follow up on cost of medication.

## 2018-05-20 NOTE — Telephone Encounter (Signed)
Patient wife states medication sent in Friday 6.14.19 canasa was too expensive and per APP Amy's requests they canceled the prescription. Pt wife now wanting to know what to do for pt.

## 2018-05-20 NOTE — Telephone Encounter (Signed)
AE is not in the office today, please advise on anything else that might be helpful and affordable for patient. See OV note from 6/14.

## 2018-05-20 NOTE — Telephone Encounter (Signed)
No similar medication. Suppository medications are not usually helpful for this condition. If his stools are not soft increase daily fluids and increase colace to bid or tid titrate to softer stool.

## 2018-05-21 NOTE — Telephone Encounter (Signed)
Ok- I told them not to pick up if cost was prohibitve.. Try fiber supplement and  Is already scheduled for Flex with treatment of radiation proctitis

## 2018-05-22 DIAGNOSIS — R269 Unspecified abnormalities of gait and mobility: Secondary | ICD-10-CM | POA: Diagnosis not present

## 2018-05-22 DIAGNOSIS — M542 Cervicalgia: Secondary | ICD-10-CM | POA: Diagnosis not present

## 2018-05-22 DIAGNOSIS — M545 Low back pain: Secondary | ICD-10-CM | POA: Diagnosis not present

## 2018-05-23 ENCOUNTER — Encounter: Payer: Self-pay | Admitting: Adult Health

## 2018-05-23 ENCOUNTER — Ambulatory Visit: Payer: Medicare Other | Admitting: Adult Health

## 2018-05-24 ENCOUNTER — Emergency Department (HOSPITAL_COMMUNITY): Payer: Medicare Other

## 2018-05-24 ENCOUNTER — Encounter (HOSPITAL_COMMUNITY): Payer: Self-pay | Admitting: Emergency Medicine

## 2018-05-24 ENCOUNTER — Emergency Department (HOSPITAL_COMMUNITY)
Admission: EM | Admit: 2018-05-24 | Discharge: 2018-05-24 | Disposition: A | Discharge status: Skilled Nursing Facility | Payer: Medicare Other | Attending: Emergency Medicine | Admitting: Emergency Medicine

## 2018-05-24 DIAGNOSIS — M545 Low back pain, unspecified: Secondary | ICD-10-CM

## 2018-05-24 DIAGNOSIS — I2510 Atherosclerotic heart disease of native coronary artery without angina pectoris: Secondary | ICD-10-CM | POA: Insufficient documentation

## 2018-05-24 DIAGNOSIS — M2550 Pain in unspecified joint: Secondary | ICD-10-CM | POA: Diagnosis not present

## 2018-05-24 DIAGNOSIS — Z7401 Bed confinement status: Secondary | ICD-10-CM | POA: Diagnosis not present

## 2018-05-24 DIAGNOSIS — G8929 Other chronic pain: Secondary | ICD-10-CM | POA: Insufficient documentation

## 2018-05-24 DIAGNOSIS — Z79899 Other long term (current) drug therapy: Secondary | ICD-10-CM | POA: Insufficient documentation

## 2018-05-24 DIAGNOSIS — Z853 Personal history of malignant neoplasm of breast: Secondary | ICD-10-CM | POA: Diagnosis not present

## 2018-05-24 DIAGNOSIS — M5489 Other dorsalgia: Secondary | ICD-10-CM | POA: Diagnosis not present

## 2018-05-24 MED ORDER — BUPROPION HCL ER (SR) 150 MG PO TB12
150.0000 mg | ORAL_TABLET | Freq: Two times a day (BID) | ORAL | Status: DC
  Administered 2018-05-24: 150 mg via ORAL
  Filled 2018-05-24: qty 1

## 2018-05-24 MED ORDER — GABAPENTIN 400 MG PO CAPS
400.0000 mg | ORAL_CAPSULE | Freq: Once | ORAL | Status: AC
  Administered 2018-05-24: 400 mg via ORAL
  Filled 2018-05-24: qty 1

## 2018-05-24 MED ORDER — HYDROMORPHONE HCL 1 MG/ML IJ SOLN
1.0000 mg | Freq: Once | INTRAMUSCULAR | Status: AC
  Administered 2018-05-24: 1 mg via INTRAMUSCULAR
  Filled 2018-05-24: qty 1

## 2018-05-24 MED ORDER — CARBOXYMETHYLCELLUL-GLYCERIN 0.5-0.9 % OP SOLN: 1.0000 [drp] | Freq: Three times a day (TID) | OPHTHALMIC | Status: DC

## 2018-05-24 MED ORDER — PROPRANOLOL HCL 10 MG PO TABS
10.0000 mg | ORAL_TABLET | Freq: Every day | ORAL | Status: DC
  Administered 2018-05-24: 10 mg via ORAL
  Filled 2018-05-24: qty 1

## 2018-05-24 MED ORDER — PANTOPRAZOLE SODIUM 40 MG PO TBEC
40.0000 mg | DELAYED_RELEASE_TABLET | Freq: Every day | ORAL | Status: DC
  Administered 2018-05-24: 40 mg via ORAL
  Filled 2018-05-24: qty 1

## 2018-05-24 MED ORDER — IPRATROPIUM BROMIDE 0.03 % NA SOLN
2.0000 | Freq: Two times a day (BID) | NASAL | Status: DC
  Filled 2018-05-24: qty 30

## 2018-05-24 MED ORDER — VITAMIN D3 25 MCG (1000 UNIT) PO TABS: 1000.0000 [IU] | ORAL_TABLET | Freq: Every day | ORAL | Status: DC

## 2018-05-24 MED ORDER — MAGNESIUM OXIDE 400 (241.3 MG) MG PO TABS
200.0000 mg | ORAL_TABLET | Freq: Every evening | ORAL | Status: DC
  Administered 2018-05-24: 200 mg via ORAL
  Filled 2018-05-24: qty 0.5

## 2018-05-24 MED ORDER — HYDROCODONE-ACETAMINOPHEN 5-325 MG PO TABS: 1.0000 | ORAL_TABLET | ORAL | 0 refills | Status: DC | PRN

## 2018-05-24 MED ORDER — PRESERVISION AREDS 2 PO CAPS: ORAL_CAPSULE | Freq: Two times a day (BID) | ORAL | Status: DC

## 2018-05-24 MED ORDER — LORAZEPAM 0.5 MG PO TABS
0.5000 mg | ORAL_TABLET | Freq: Once | ORAL | Status: AC
  Administered 2018-05-24: 0.5 mg via ORAL
  Filled 2018-05-24: qty 1

## 2018-05-24 MED ORDER — KETOROLAC TROMETHAMINE 15 MG/ML IJ SOLN
15.0000 mg | Freq: Once | INTRAMUSCULAR | Status: AC
  Administered 2018-05-24: 15 mg via INTRAMUSCULAR
  Filled 2018-05-24: qty 1

## 2018-05-24 MED ORDER — PRAVASTATIN SODIUM 20 MG PO TABS
20.0000 mg | ORAL_TABLET | Freq: Every day | ORAL | Status: DC
  Administered 2018-05-24: 20 mg via ORAL
  Filled 2018-05-24: qty 1

## 2018-05-24 MED ORDER — DEXAMETHASONE 4 MG PO TABS
8.0000 mg | ORAL_TABLET | Freq: Once | ORAL | Status: AC
  Administered 2018-05-24: 8 mg via ORAL
  Filled 2018-05-24: qty 2

## 2018-05-24 MED ORDER — GABAPENTIN 300 MG PO CAPS
300.0000 mg | ORAL_CAPSULE | Freq: Two times a day (BID) | ORAL | Status: DC
Start: 2018-05-24 — End: 2018-05-25

## 2018-05-24 MED ORDER — FAMOTIDINE 20 MG PO TABS
20.0000 mg | ORAL_TABLET | Freq: Every day | ORAL | Status: DC
Start: 2018-05-24 — End: 2018-05-25

## 2018-05-24 MED ORDER — POLYVINYL ALCOHOL 1.4 % OP SOLN
1.0000 [drp] | Freq: Three times a day (TID) | OPHTHALMIC | Status: DC
  Filled 2018-05-24: qty 15

## 2018-05-24 MED ORDER — TRAMADOL HCL 50 MG PO TABS
100.0000 mg | ORAL_TABLET | Freq: Two times a day (BID) | ORAL | Status: DC | PRN
Start: 2018-05-24 — End: 2018-05-25

## 2018-05-24 MED ORDER — METHOCARBAMOL 500 MG PO TABS: 500.0000 mg | ORAL_TABLET | Freq: Three times a day (TID) | ORAL | Status: DC | PRN

## 2018-05-24 MED ORDER — DOCUSATE SODIUM 100 MG PO CAPS: 100.0000 mg | ORAL_CAPSULE | Freq: Every day | ORAL | Status: DC | PRN

## 2018-05-24 MED ORDER — FERROUS SULFATE 325 (65 FE) MG PO TABS
325.0000 mg | ORAL_TABLET | Freq: Every day | ORAL | Status: DC
  Filled 2018-05-24: qty 1

## 2018-05-24 MED ORDER — PROSIGHT PO TABS
1.0000 | ORAL_TABLET | Freq: Two times a day (BID) | ORAL | Status: DC
  Filled 2018-05-24: qty 1

## 2018-05-24 MED ORDER — TAMSULOSIN HCL 0.4 MG PO CAPS
0.4000 mg | ORAL_CAPSULE | Freq: Two times a day (BID) | ORAL | Status: DC
  Administered 2018-05-24: 0.4 mg via ORAL
  Filled 2018-05-24 (×2): qty 1

## 2018-05-24 NOTE — NC FL2 (Signed)
[place] LEVEL OF CARE SCREENING TOOL     IDENTIFICATION  Patient Name: Charles [name] Birthdate: 1931/07/26 Sex: male Admission Date (Current Location): 05/24/2018  [facility] and [place] Number:  Herbalist and Address:         Provider Number:    Attending Physician Name and Address:  Virgel Manifold, MD  Relative Name and Phone Number:       Current Level of Care: Hospital Recommended Level of Care: [place] Prior Approval Number:    Date Approved/Denied: 05/24/18 PASRR Number: [id number]  Discharge Plan: SNF    Current Diagnoses: Patient Active Problem List   Diagnosis Date Noted  . AVM (arteriovenous malformation) of colon   . Radiation proctitis   . Toe amputation status, right ([place]) 03/01/2018  . Diverticulosis of colon with hemorrhage   . Acute blood loss anemia   . History of back surgery 10/03/2017  . Rectal bleeding 10/03/2017  . Hematochezia   . Lower GI bleeding 09/29/2017  . Treatment-emergent central sleep apnea 06/25/2017  . Chronic bilateral low back pain with bilateral sciatica 01/17/2017  . Post laminectomy syndrome 01/17/2017  . Chronic pain syndrome 01/17/2017  . Sacroiliitis ([place]) 01/17/2017  . Piriformis syndrome of right side 01/17/2017  . Malignant neoplasm of prostate ([place]) 07/19/2016  . Fixation hardware in spine 05/15/2016  . Hypoglycemic reaction 02/02/2015  . Tremor, essential 12/11/2014  . OSA on CPAP 04/24/2014  . 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 Hypersomnia with sleep apnea, unspecified 04/24/2014  . Depression 04/18/2013  . BPH (benign prostatic hyperplasia) 04/18/2013  . Impingement syndrome of right shoulder 04/14/2013    Class: Acute  . Scoliosis or kyphoscoliosis 04/08/2013    Class: Chronic  . Lumbosacral spondylosis without myelopathy 04/08/2013    Class: Chronic  . Acute respiratory failure ([place]) 04/08/2013  . Essential hypertension 04/08/2013  .  Lymphocytosis   . HIATAL HERNIA 11/21/2010  . DIVERTICULAR DISEASE 11/21/2010  . BARRETT'S ESOPHAGUS 02/22/2010  . GERD 07/20/2008  . INGUINAL HERNIA, LEFT, SMALL 07/20/2008  . OTH VENTRAL HERN W/O MENTION OBSTRUCTION/GANGREN 07/20/2008  . CONSTIPATION 07/20/2008  . DYSPHAGIA 07/20/2008    Orientation RESPIRATION BLADDER Height & Weight     Self, Time, Situation, Place  Normal Continent Weight:   Height:     BEHAVIORAL SYMPTOMS/MOOD NEUROLOGICAL BOWEL NUTRITION STATUS      Continent Diet(Regular diet)  AMBULATORY STATUS COMMUNICATION OF NEEDS Skin   Extensive Assist Verbally Normal                       Personal Care Assistance Level of Assistance  Bathing, Dressing Bathing Assistance: Maximum assistance   Dressing Assistance: Maximum assistance     Functional Limitations Info             SPECIAL CARE FACTORS FREQUENCY  PT (By licensed PT), OT (By licensed OT)     PT Frequency: 2-3 times per week  OT Frequency: 2-3 times per week            Contractures Contractures Info: Not present    Additional Factors Info  Code Status, Allergies Code Status Info: Prior Allergies Info: Celebrex Celecoxib, Flexeril Cyclobenzaprine           Current Medications (05/24/2018):  This is the current hospital active medication list Current Facility-Administered Medications  Medication Dose Route Frequency Provider Last Rate Last Dose  . buPROPion [facility] SR) 12 hr tablet 150 mg  150 mg  Oral BID Virgel Manifold, MD      . Derrill Memo ON 05/25/2018] cholecalciferol (VITAMIN D) tablet 1,000 Units  1,000 Units Oral Daily Virgel Manifold, MD      . docusate sodium (COLACE) capsule 100 mg  100 mg Oral Daily PRN Virgel Manifold, MD      . famotidine (PEPCID) tablet 20 mg  20 mg Oral QHS Virgel Manifold, MD      . Derrill Memo ON 05/25/2018] ferrous sulfate tablet 325 mg  325 mg Oral Q breakfast Virgel Manifold, MD      . gabapentin (NEURONTIN) capsule 300-1,200 mg  300-1,200 mg Oral BID  Virgel Manifold, MD   Stopped at 05/24/18 1538  . ipratropium (ATROVENT) 0.03 % nasal spray 2 spray  2 spray Each Nare Q12H Virgel Manifold, MD      . magnesium oxide (MAG-OX) tablet 200 mg  200 mg Oral QPM Virgel Manifold, MD      . methocarbamol (ROBAXIN) tablet 500 mg  500 mg Oral Q8H PRN Virgel Manifold, MD      . multivitamin (PROSIGHT) tablet 1 tablet  1 tablet Oral BID Virgel Manifold, MD      . pantoprazole (PROTONIX) EC tablet 40 mg  40 mg Oral Daily Virgel Manifold, MD      . polyvinyl alcohol (LIQUIFILM TEARS) 1.4 % ophthalmic solution 1 drop  1 drop Both Eyes TID Virgel Manifold, MD      . pravastatin (PRAVACHOL) tablet 20 mg  20 mg Oral q1800 Virgel Manifold, MD      . propranolol (INDERAL) tablet 10 mg  10 mg Oral Daily Virgel Manifold, MD      . tamsulosin [facility]) capsule 0.4 mg  0.4 mg Oral BID Virgel Manifold, MD      . traMADol [facility]at[facility]) tablet 100 mg  100 mg Oral BID PRN Virgel Manifold, MD       Current Outpatient Medications  Medication Sig Dispense Refill  . acetaminophen (TYLENOL) 500 MG tablet Take 1,000 mg by mouth every 6 (six) hours as needed for mild pain. TAKES WITH TRAMADOL    . buPROPion (WELLBUTRIN SR) 150 MG 12 hr tablet Take 150 mg by mouth 2 (two) times daily.     . carboxymethylcellul-glycerin (REFRESH REPAIR) 0.5-0.9 % ophthalmic solution Place 1 drop into both eyes 3 (three) times daily.    . cholecalciferol (VITAMIN D) 1000 UNITS tablet Take 1,000 Units by mouth daily.    . ferrous sulfate 325 (65 FE) MG EC tablet Take 325 mg by mouth daily with breakfast.    . gabapentin (NEURONTIN) 300 MG capsule Take 1 tablet PO at 4 PM  and 1 tablet at 10 PM (Patient taking differently: Take 300-1,200 mg by mouth 2 (two) times daily. Take 1 capsule (300 mg) in morning and Take 4 capsules (1200 mg) in the evening.) 180 capsule 3  . ipratropium (ATROVENT) 0.03 % nasal spray Place 2 sprays into both nostrils every 12 (twelve) hours.    . lovastatin (MEVACOR) 20 MG tablet Take 20 mg  by mouth at bedtime.     . Magnesium 250 MG TABS Take 250 mg by mouth every evening.     . methocarbamol (ROBAXIN) 500 MG tablet Take 500 mg by mouth every 8 (eight) hours as needed for muscle spasms.    . Multiple Minerals-Vitamins (CITRACAL PLUS BONE DENSITY) TABS Take 1 tablet by mouth 2 (two) times daily.     . Multiple Vitamins-Minerals (CENTRUM ADULTS PO) Take 1 tablet by mouth daily.    Marland Kitchen  Multiple Vitamins-Minerals (PRESERVISION AREDS 2 PO) Take 1 capsule by mouth 2 (two) times daily.    Marland Kitchen omeprazole (PRILOSEC) 40 MG capsule TAKE 1 CAPSULE(40 MG) BY MOUTH TWICE DAILY 180 capsule 1  . propranolol (INDERAL) 10 MG tablet Take 10 mg by mouth daily.     . ranitidine (ZANTAC) 300 MG tablet TAKE 1 TABLET(300 MG) BY MOUTH AT BEDTIME 90 tablet 1  . Tamsulosin HCl (FLOMAX) 0.4 MG CAPS Take 0.4 mg by mouth 2 (two) times daily.     . traMADol (ULTRAM) 50 MG tablet Take 2 tablets (100 mg total) by mouth 2 (two) times daily as needed for moderate pain. 60 tablet 3  . docusate sodium (COLACE) 100 MG capsule Take 100 mg by mouth daily as needed for mild constipation.     Marland Kitchen HYDROcodone-acetaminophen (NORCO/VICODIN) 5-325 MG tablet Take 1-2 tablets by mouth every 4 (four) hours as needed. 20 tablet 0  . magnesium hydroxide (MILK OF MAGNESIA) 400 MG/5ML suspension Take 5 mLs by mouth at bedtime as needed for mild constipation.    . mesalamine (CANASA) 1000 MG suppository Place 1 suppository (1,000 mg total) rectally at bedtime. 30 suppository 3     Discharge Medications: Please see discharge summary for a list of discharge medications.  Relevant Imaging Results:  Relevant Lab Results:   Additional Information [id number]  Alphonse Guild Quamir Willemsen, LCSWA

## 2018-05-24 NOTE — ED Notes (Addendum)
Patient able to stand and ambulate three steps with two staff assist and walker in room. MD made aware.

## 2018-05-24 NOTE — ED Notes (Signed)
PTAR is here to pick up patient to take to [facility]. Patient is stable and ready for d/c. He has been sleeping since the beginning of my shift at 1900.

## 2018-05-24 NOTE — ED Notes (Signed)
Bed: PE96 Expected date:  Expected time:  Means of arrival:  Comments: 84 yo back pain

## 2018-05-24 NOTE — Clinical Social Work Placement (Signed)
   CLINICAL SOCIAL WORK PLACEMENT  NOTE  Date:  05/24/2018  Patient Details  Name: Charles [name] MRN: [id number] Date of Birth: 10/02/1931  Clinical Social Work is seeking post-discharge placement for this patient at the [place] level of care (*CSW will initial, date and re-position this form in  chart as items are completed):  Yes   Patient/family provided with [place] Work Department's list of facilities offering this level of care within the geographic area requested by the patient (or if unable, by the patient's family).  Yes   Patient/family informed of their freedom to choose among providers that offer the needed level of care, that participate in Medicare, Medicaid or managed care program needed by the patient, have an available bed and are willing to accept the patient.      Patient/family informed of [facility]'s ownership interest in [facility] and [facility], as well as of the fact that they are under no obligation to receive care at these facilities.  PASRR submitted to EDS on       PASRR number received on       Existing PASRR number confirmed on 05/24/18     FL2 transmitted to all facilities in geographic area requested by pt/family on       FL2 transmitted to all facilities within larger geographic area on       Patient informed that his/her managed care company has contracts with or will negotiate with certain facilities, including the following:        Yes   Patient/family informed of bed offers received.  Patient chooses bed at [facility])     Physician recommends and patient chooses bed at      Patient to be transferred to   on  .  Patient to be transferred to facility by Corey Harold)     Patient family notified on 05/24/18 of transfer.  Name of family member notified:  Shariff Lasky [phone]     PHYSICIAN Please prepare priority discharge summary, including medications, Please prepare  prescriptions     Additional Comment:   EDP: Please prepare hard copies of script for pt's Hydrocodone _______________________________________________ Claudine Mouton, LCSWA 05/24/2018, 5:07 PM

## 2018-05-24 NOTE — Clinical Social Work Note (Signed)
Clinical Social Work Assessment  Patient Details  Name: Charles [name] MRN: [id number] Date of Birth: 07-08-1931  Date of referral:  05/24/18               Reason for consult:  Facility Placement, Discharge Planning                Permission sought to share information with:    Permission granted to share information::     Name::        Agency::     Relationship::     Contact Information:     Housing/Transportation Living arrangements for the past 2 months:  [place] of Information:  Patient, Spouse Patient Interpreter Needed:  None Criminal Activity/Legal Involvement Pertinent to Current Situation/Hospitalization:    Significant Relationships:  Spouse Lives with:  Spouse Do you feel safe going back to the place where you live?  Yes Need for family participation in patient care:     Care giving concerns:  Patient and spouse, Charles [name], are unsure of where they would like patient to be discharged to at this time. Patient currently lives at [facility] however is unsure if he would like to go to their SNF facility.    Social Worker assessment / plan:  CSW met with patient and spouse via bedside regarding disposition planning. Patient and spouse currently live at [facility]. Patient is experiencing back pain that is limiting his walking. Spouse, Charles [name], states she has previously been at [facility] in the past and is unsure if patient would benefit from their PT. At this time patient and spouse have not decided on discharge plans.   Plan: TBD  Employment status:  Retired Forensic scientist:  Medicare PT Recommendations:  Not assessed at this time Information / Referral to community resources:     Patient/Family's Response to care:  Patient and spouse appreciated CSW however seemed anxious regarding decision making.   Patient/Family's Understanding of and Emotional Response to Diagnosis, Current Treatment,  and Prognosis: Unknown at this time.   Emotional Assessment Appearance:    Attitude/Demeanor/Rapport:    Affect (typically observed):  Accepting, Pleasant, Anxious Orientation:  Oriented to Self, Oriented to Place, Oriented to  Time, Oriented to Situation Alcohol / Substance use:    Psych involvement (Current and /or in the community):  No (Comment)  Discharge Needs  Concerns to be addressed:  No discharge needs identified Readmission within the last 30 days:  No Current discharge risk:  None Barriers to Discharge:  No Barriers Identified   Charles Anna, LCSW 05/24/2018, 3:08 PM

## 2018-05-24 NOTE — Progress Notes (Addendum)
CSW met with pt's wife, STANLEE ROEHRIG at ph: [phone]  and SIL who stated they desire a SNF stay at [facility] and are willing to private pay. CSW called Joellen Jersey in admissions at [facility] and left a HIPPA-compliant VM and CSW is awaiting a return call from.  Pt's wife is requesting assurance from the facility that pt will be able to leave the SNF facility and return to their independent living home at [facility] with PT being completed at a local agency once the pt is able to effectively manage the pt's pain.  CSW spoke to Albright Raynick at ph: [phone] who confirmed pt and pt's wife will be involved in planning meeting to assist pt in meeting pot's goal of managing his pain and ambulating with a walker in order to D/C from the SNF in a timely manner and in acordance with their wishes and Miss Raynick asked the CSW to assure the family they can leave when pt/pt's family feels they are able.  CSW will continue to follow for D/C needs.  Alphonse Guild. Melville Engen, LCSW, LCAS, CSI Clinical Social Worker Ph: [phone]

## 2018-05-24 NOTE — ED Notes (Signed)
Patient transported to X-ray 

## 2018-05-24 NOTE — ED Notes (Signed)
Family at bedside. 

## 2018-05-24 NOTE — Progress Notes (Addendum)
CSW requested hard copies of scripts (Hydrocodone) as requested by pt's SNF and EDP stated EDP will consult the daytime EDP who covered pt today.  CSW/RN received AVS and Hard Copy of pt's script.  CSW faxed to pt's SNF and received successful transmission report.  Family/RN/EPD updated. Pt being prepared for D/C and per RN, PT[facility] called for transport.  Please reconsult if future social work needs arise.  CSW signing off, as social work intervention is no longer needed.  Alphonse Guild. Shandy Vi, LCSW, LCAS, CSI Clinical Social Worker Ph: [phone]

## 2018-05-24 NOTE — ED Provider Notes (Signed)
[name] Provider Note   CSN: [id number] Arrival date & time: 05/24/18  1122     History   Chief Complaint Chief Complaint  Patient presents with  . Back Pain    HPI Charles [name] is a 84 y.o. male.  HPI   84 year old male with lower back pain.  He has a history of chronic lower back pain.  He has 2 previous surgeries in 1998 and 2014.  He recently began physical therapy a week and a half ago.  He initially did well but after couple sessions he began having increasing pain in his lower back.  Is now to the point where he is having difficulty is getting up from seated position.  He has no acute complaints neurologically otherwise.  No fevers or chills.  No incontinence or retention. Denies any recent falls.   Past Medical History:  Diagnosis Date  . Anemia   . Arthritis   . AVM (arteriovenous malformation)   . Barrett's esophagus   . BPH (benign prostatic hyperplasia)   . CAD (coronary artery disease)    only on stress test; never had MI, PCI or CABG.  In 2008, LVEF was reportedly normal. pt. denies  . Cataract   . Constipation   . CPAP (continuous positive airway pressure) dependence   . Depression   . Diverticulosis    diverticular bleeding  . Diverticulosis   . GERD (gastroesophageal reflux disease)   . History of radiation therapy   . Hyperlipidemia   . Hypoglycemia   . Lymphocytosis 06/2012  . Mastodynia   . Murmur    slight  . Nasal congestion   . Osteoporosis   . Osteoporosis   . Prostate cancer ([place])   . Sleep apnea    wears CPAP  . Urinary retention with incomplete bladder emptying    pt uses bathroom and within 30 mins needs to go again  . Vitamin D deficiency     Patient Active Problem List   Diagnosis Date Noted  . AVM (arteriovenous malformation) of colon   . Radiation proctitis   . Toe amputation status, right ([place]) 03/01/2018  . Diverticulosis of colon with hemorrhage   . Acute blood loss anemia     . History of back surgery 10/03/2017  . Rectal bleeding 10/03/2017  . Hematochezia   . Lower GI bleeding 09/29/2017  . Treatment-emergent central sleep apnea 06/25/2017  . Chronic bilateral low back pain with bilateral sciatica 01/17/2017  . Post laminectomy syndrome 01/17/2017  . Chronic pain syndrome 01/17/2017  . Sacroiliitis ([place]) 01/17/2017  . Piriformis syndrome of right side 01/17/2017  . Malignant neoplasm of prostate ([place]) 07/19/2016  . Fixation hardware in spine 05/15/2016  . Hypoglycemic reaction 02/02/2015  . Tremor, essential 12/11/2014  . OSA on CPAP 04/24/2014  . 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 Hypersomnia with sleep apnea, unspecified 04/24/2014  . Depression 04/18/2013  . BPH (benign prostatic hyperplasia) 04/18/2013  . Impingement syndrome of right shoulder 04/14/2013    Class: Acute  . Scoliosis or kyphoscoliosis 04/08/2013    Class: Chronic  . Lumbosacral spondylosis without myelopathy 04/08/2013    Class: Chronic  . Acute respiratory failure ([place]) 04/08/2013  . Essential hypertension 04/08/2013  . Lymphocytosis   . HIATAL HERNIA 11/21/2010  . DIVERTICULAR DISEASE 11/21/2010  . BARRETT'S ESOPHAGUS 02/22/2010  . GERD 07/20/2008  . INGUINAL HERNIA, LEFT, SMALL 07/20/2008  . OTH VENTRAL HERN W/O MENTION OBSTRUCTION/GANGREN 07/20/2008  . CONSTIPATION 07/20/2008  .  DYSPHAGIA 07/20/2008    Past Surgical History:  Procedure Laterality Date  . AMPUTATION TOE Right 09/20/2017   Procedure: Right 4th toe amputation;  Surgeon: Wylene Simmer, MD;  Location: [facility];  Service: Orthopedics;  Laterality: Right;  foot block  . BACK SURGERY  04/2013  . back surgury     f  . BLEPHAROPLASTY Bilateral 02/23/15  . CATARACT EXTRACTION    . COLONOSCOPY    . COLONOSCOPY N/A 10/02/2017   Procedure: COLONOSCOPY;  Surgeon: Doran Stabler, MD;  Location: [facility];  Service: Gastroenterology;  Laterality: N/A;  . COLONOSCOPY WITH PROPOFOL  N/A 10/01/2017   Procedure: COLONOSCOPY WITH PROPOFOL;  Surgeon: Doran Stabler, MD;  Location: [facility];  Service: Gastroenterology;  Laterality: N/A;  . COLONOSCOPY WITH PROPOFOL N/A 03/07/2018   Procedure: COLONOSCOPY WITH PROPOFOL;  Surgeon: [name], MD;  Location: [facility];  Service: Endoscopy;  Laterality: N/A;  . ESOPHAGOGASTRODUODENOSCOPY (EGD) WITH PROPOFOL N/A 10/01/2017   Procedure: ESOPHAGOGASTRODUODENOSCOPY (EGD) WITH PROPOFOL;  Surgeon: Doran Stabler, MD;  Location: [facility];  Service: Gastroenterology;  Laterality: N/A;  . EYE SURGERY Bilateral   . FINGER SURGERY Right    right thumb  . HARDWARE REMOVAL Right 05/15/2016   Procedure: Right Lumbar Two-Lumbar Five Removal of Hardware;  Surgeon: Kristeen Miss, MD;  Location: [facility];  Service: Neurosurgery;  Laterality: Right;  Right L2-5 removal of hardware  . INGUINAL HERNIA REPAIR     x4  . REPAIR SPIGELIAN HERNIA  2011  . [place] ,2014  . TONSILLECTOMY    . UPPER GASTROINTESTINAL ENDOSCOPY    . VASECTOMY          Home Medications    Prior to Admission medications   Medication Sig Start Date End Date Taking? Authorizing Provider  acetaminophen (TYLENOL) 500 MG tablet Take 1,000 mg by mouth every 6 (six) hours as needed for mild pain. TAKES WITH TRAMADOL   Yes [provider]  buPROPion (WELLBUTRIN SR) 150 MG 12 hr tablet Take 150 mg by mouth 2 (two) times daily.  06/28/11  Yes [provider]  carboxymethylcellul-glycerin (REFRESH REPAIR) 0.5-0.9 % ophthalmic solution Place 1 drop into both eyes 3 (three) times daily.   Yes [provider]  cholecalciferol (VITAMIN D) 1000 UNITS tablet Take 1,000 Units by mouth daily.   Yes [provider]  ferrous sulfate 325 (65 FE) MG EC tablet Take 325 mg by mouth daily with breakfast.   Yes [provider]  gabapentin (NEURONTIN) 300 MG capsule Take 1 tablet PO at 4 PM  and 1 tablet at 10  PM Patient taking differently: Take 300-1,200 mg by mouth 2 (two) times daily. Take 1 capsule (300 mg) in morning and Take 4 capsules (1200 mg) in the evening. 06/20/16  Yes Ward Givens, NP  ipratropium (ATROVENT) 0.03 % nasal spray Place 2 sprays into both nostrils every 12 (twelve) hours.   Yes [provider]  lovastatin (MEVACOR) 20 MG tablet Take 20 mg by mouth at bedtime.  07/22/12  Yes [provider]  Magnesium 250 MG TABS Take 250 mg by mouth every evening.    Yes [provider]  methocarbamol (ROBAXIN) 500 MG tablet Take 500 mg by mouth every 8 (eight) hours as needed for muscle spasms.   Yes [provider]  Multiple Minerals-Vitamins (CITRACAL PLUS BONE DENSITY) TABS Take 1 tablet by mouth 2 (two) times daily.    Yes  [provider]  Multiple Vitamins-Minerals (CENTRUM ADULTS PO) Take 1 tablet by mouth daily.   Yes [provider]  Multiple Vitamins-Minerals (PRESERVISION AREDS 2 PO) Take 1 capsule by mouth 2 (two) times daily.   Yes [provider]  omeprazole (PRILOSEC) 40 MG capsule TAKE 1 CAPSULE(40 MG) BY MOUTH TWICE DAILY 02/25/18  Yes [name], MD  propranolol (INDERAL) 10 MG tablet Take 10 mg by mouth daily.    Yes [provider]  ranitidine (ZANTAC) 300 MG tablet TAKE 1 TABLET(300 MG) BY MOUTH AT BEDTIME 02/25/18  Yes [name], MD  Tamsulosin HCl (FLOMAX) 0.4 MG CAPS Take 0.4 mg by mouth 2 (two) times daily.  08/30/11  Yes [provider]  traMADol (ULTRAM) 50 MG tablet Take 2 tablets (100 mg total) by mouth 2 (two) times daily as needed for moderate pain. 05/15/16  Yes Kristeen Miss, MD  docusate sodium (COLACE) 100 MG capsule Take 100 mg by mouth daily as needed for mild constipation.     [provider]  magnesium hydroxide (MILK OF MAGNESIA) 400 MG/5ML suspension Take 5 mLs by mouth at bedtime as needed for mild constipation.    [provider]  mesalamine  (CANASA) 1000 MG suppository Place 1 suppository (1,000 mg total) rectally at bedtime. 05/17/18   Esterwood, Amy S, PA-C    Family History Family History  Problem Relation Age of Onset  . Prostate cancer Father   . CAD Mother   . Arthritis-Osteo Brother   . Colon cancer Neg Hx   . Stomach cancer Neg Hx     Social History Social History   Tobacco Use  . Smoking status: Never Smoker  . Smokeless tobacco: Never Used  Substance Use Topics  . Alcohol use: No  . Drug use: No     Allergies   Celebrex [celecoxib] and Flexeril [cyclobenzaprine]   Review of Systems Review of Systems  All systems reviewed and negative, other than as noted in HPI.  Physical Exam Updated Vital Signs BP 133/68 (BP Location: Right Arm)   Pulse 64   Temp 98.8 F (37.1 C) (Oral)   Resp 17   SpO2 96%   Physical Exam  Constitutional: He appears well-developed and well-nourished. No distress.  HENT:  Head: Normocephalic and atraumatic.  Eyes: Conjunctivae are normal. Right eye exhibits no discharge. Left eye exhibits no discharge.  Neck: Neck supple.  Cardiovascular: Normal rate, regular rhythm and normal heart sounds. Exam reveals no gallop and no friction rub.  No murmur heard. Pulmonary/Chest: Effort normal and breath sounds normal. No respiratory distress.  Abdominal: Soft. He exhibits no distension. There is no tenderness.  Musculoskeletal: He exhibits no edema or tenderness.  Difficulty with both sitting up and rolling over in bed because of pain. Back pain is not reproducible with palpation.   Neurological: He is alert.  Strength 5/5 b/l LE. Sensation intact to light touch.   Skin: Skin is warm and dry.  Psychiatric: He has a normal mood and affect. His behavior is normal. Thought content normal.  Nursing note and vitals reviewed.    ED Treatments / Results  Labs (all labs ordered are listed, but only abnormal results are displayed) Labs Reviewed - No data to  display  EKG None  Radiology Dg Lumbar Spine Complete  Result Date: 05/24/2018 CLINICAL DATA:  Pt c/o lower back pain x10 days since starting physical therapy. Hx chronic back pain and fusion. EXAM: LUMBAR SPINE - COMPLETE 4+ VIEW  COMPARISON:  CT 01/10/2017 FINDINGS: Postoperative changes are identified with LEFT-sided transpedicular fusion at L2-S1. Hardware is intact. There is stable anterolisthesis of L5 on S1, grade 1. Significant sclerosis identified at the disc space of L1-2. There is no acute fracture. IMPRESSION: Stable postoperative changes. Stable and significant degenerative changes at L1-2. Electronically Signed   By: Nolon Nations M.D.   On: 05/24/2018 14:06    Procedures Procedures (including critical care time)  Medications Ordered in ED Medications  gabapentin (NEURONTIN) capsule 400 mg (400 mg Oral Given 05/24/18 1232)  LORazepam (ATIVAN) tablet 0.5 mg (0.5 mg Oral Given 05/24/18 1232)  dexamethasone (DECADRON) tablet 8 mg (8 mg Oral Given 05/24/18 1232)  HYDROmorphone (DILAUDID) injection 1 mg (1 mg Intramuscular Given 05/24/18 1234)  ketorolac (TORADOL) 15 MG/ML injection 15 mg (15 mg Intramuscular Given 05/24/18 1234)     Initial Impression / Assessment and Plan / ED Course  I have reviewed the triage vital signs and the nursing notes.  Pertinent labs & imaging results that were available during my care of the patient were reviewed by me and considered in my medical decision making (see chart for details).     86yM with lower back pain. No direct trauma. I suspect strain after starting PT. Nonfocal neuro exam. Afebrile. Pain improved somewhat after meds but still difficulty ambulating with a walker. No criteria for admission. He lives in independent setting currently which will be difficult with his current functional status., Social work to try and assist in alternative placement.   Final Clinical Impressions(s) / ED Diagnoses   Final diagnoses:  Midline low  back pain without sciatica, unspecified chronicity    ED Discharge Orders    None       Virgel Manifold, MD 05/24/18 [phone]

## 2018-05-24 NOTE — ED Notes (Signed)
Patient given sandwich and water

## 2018-05-24 NOTE — ED Triage Notes (Addendum)
Per PTAR, patient coming from [facility] independent living, c/o lower back pain x10 days since starting physical therapy. Hx chronic back pain. A&Ox4.  BP 130/50 HR 80 RR 16 98% RA CBG 147

## 2018-05-24 NOTE — Progress Notes (Signed)
CSW received a call from [place] in admissions from [place] received a call from [place] Pt has been accepted by: [facility] Number for report is: [phone] Pt's unit/room/bed number will be: Room 24 Accepting supervisor: Hedy Jacob Accepting physician: SNF Physician   Pt can arrive ASAP on 05/24/18  CSW will update RN and EDP.  Alphonse Guild. Lizeth Bencosme, LCSW, LCAS, CSI Clinical Social Worker Ph: [phone]  '

## 2018-05-27 ENCOUNTER — Non-Acute Institutional Stay (SKILLED_NURSING_FACILITY): Payer: Medicare Other | Admitting: Internal Medicine

## 2018-05-27 ENCOUNTER — Encounter: Payer: Self-pay | Admitting: Internal Medicine

## 2018-05-27 DIAGNOSIS — K627 Radiation proctitis: Secondary | ICD-10-CM | POA: Diagnosis not present

## 2018-05-27 DIAGNOSIS — G4733 Obstructive sleep apnea (adult) (pediatric): Secondary | ICD-10-CM

## 2018-05-27 DIAGNOSIS — Z8546 Personal history of malignant neoplasm of prostate: Secondary | ICD-10-CM

## 2018-05-27 DIAGNOSIS — K219 Gastro-esophageal reflux disease without esophagitis: Secondary | ICD-10-CM

## 2018-05-27 DIAGNOSIS — M5442 Lumbago with sciatica, left side: Secondary | ICD-10-CM

## 2018-05-27 DIAGNOSIS — R2681 Unsteadiness on feet: Secondary | ICD-10-CM

## 2018-05-27 DIAGNOSIS — G8929 Other chronic pain: Secondary | ICD-10-CM

## 2018-05-27 DIAGNOSIS — I10 Essential (primary) hypertension: Secondary | ICD-10-CM

## 2018-05-27 DIAGNOSIS — M5441 Lumbago with sciatica, right side: Secondary | ICD-10-CM

## 2018-05-27 DIAGNOSIS — G2581 Restless legs syndrome: Secondary | ICD-10-CM | POA: Diagnosis not present

## 2018-05-27 DIAGNOSIS — Z9989 Dependence on other enabling machines and devices: Secondary | ICD-10-CM | POA: Diagnosis not present

## 2018-05-27 DIAGNOSIS — F329 Major depressive disorder, single episode, unspecified: Secondary | ICD-10-CM

## 2018-05-27 DIAGNOSIS — M6281 Muscle weakness (generalized): Secondary | ICD-10-CM | POA: Diagnosis not present

## 2018-05-27 DIAGNOSIS — E785 Hyperlipidemia, unspecified: Secondary | ICD-10-CM | POA: Diagnosis not present

## 2018-05-27 DIAGNOSIS — I2510 Atherosclerotic heart disease of native coronary artery without angina pectoris: Secondary | ICD-10-CM

## 2018-05-27 DIAGNOSIS — D649 Anemia, unspecified: Secondary | ICD-10-CM

## 2018-05-27 DIAGNOSIS — M545 Low back pain: Secondary | ICD-10-CM | POA: Diagnosis not present

## 2018-05-27 DIAGNOSIS — F32A Depression, unspecified: Secondary | ICD-10-CM

## 2018-05-27 DIAGNOSIS — R29898 Other symptoms and signs involving the musculoskeletal system: Secondary | ICD-10-CM | POA: Diagnosis not present

## 2018-05-27 NOTE — Progress Notes (Addendum)
Provider:  Blanchie Serve MD  Location:  [facility] Room Number: 24 Place of Service:  SNF [phone]  PCP: Leanna Battles, MD Patient Care Team: Leanna Battles, MD as PCP - General (Internal Medicine) [name], MD (Hematology and Oncology) [name], MD as Attending Physician (Urology) [name], MD (Gastroenterology)  Extended Emergency Contact Information Primary Emergency Contact: Spanos,Janet H Address: [address]          [address]          [place], [place] [address] [name] of [place] Phone: [phone] Mobile Phone: [phone] Relation: Spouse  Code Status: FULL CODE  Goals of Care: Advanced Directive information Advanced Directives 05/27/2018  Does Patient Have a Medical Advance Directive? Yes  Type of Paramedic of [place];Living will  Does patient want to make changes to medical advance directive? No - Patient declined  Copy of [place] in Chart? No - copy requested  Would patient like information on creating a medical advance directive? -      Chief Complaint  Patient presents with  . New Admit To SNF    New Admission Visit     HPI: Patient is a 84 y.o. male seen today for admission visit. He was admitted to SNF on 05/24/18 for rehabilitation from ED for acute on chronic low back pain thought to be from muscle strain from working with therapy team. He was requiring increased assistance with ADLs and was noted to have unsteady gait. Xray of lumbar spine in ED showed fusion L2-S1 and stable significant degenerative changes at L1-L2. He is seen in his room today. He has medical history of chronic low back pain s/p lumbar fusion of his back with 2 surgeries in 1998 and 2014, GERD, prostate cancer s/ radiation, radiation proctitis, history of diverticular hemorrhage, CAD, hyperlipidemia, OSA and depression among others. On chart review, he has history of rectal bleed  from radiation proctitis  Requiring argon plasma coagulation and is followed by GI. He is seen in his room today.  Spoke with his wife over the phone and gathered some medical history. Patient has been seen by orthopedic and neurosurgery and has had several test and imaging along with surgeries for his back pain. His surgery in 2014 had helped his back pain for about a year and then it started worsening and limited his mobility. He was using walker after that and in recent days is having extensive trouble using walker and ambulating. No recent fall or trauma reported.   Past Medical History:  Diagnosis Date  . Anemia   . Arthritis   . AVM (arteriovenous malformation)   . Barrett's esophagus   . BPH (benign prostatic hyperplasia)   . CAD (coronary artery disease)    only on stress test; never had MI, PCI or CABG.  In 2008, LVEF was reportedly normal. pt. denies  . Cataract   . Constipation   . CPAP (continuous positive airway pressure) dependence   . Depression   . Diverticulosis    diverticular bleeding  . Diverticulosis   . GERD (gastroesophageal reflux disease)   . History of radiation therapy   . Hyperlipidemia   . Hypoglycemia   . Lymphocytosis 06/2012  . Mastodynia   . Murmur    slight  . Nasal congestion   . Osteoporosis   . Osteoporosis   . Prostate cancer ([place])   . Sleep apnea    wears CPAP  .  Urinary retention with incomplete bladder emptying    pt uses bathroom and within 30 mins needs to go again  . Vitamin D deficiency    Past Surgical History:  Procedure Laterality Date  . AMPUTATION TOE Right 09/20/2017   Procedure: Right 4th toe amputation;  Surgeon: Wylene Simmer, MD;  Location: [facility];  Service: Orthopedics;  Laterality: Right;  foot block  . BACK SURGERY  04/2013  . back surgury     f  . BLEPHAROPLASTY Bilateral 02/23/15  . CATARACT EXTRACTION    . COLONOSCOPY    . COLONOSCOPY N/A 10/02/2017   Procedure: COLONOSCOPY;  Surgeon: Doran Stabler, MD;  Location: [facility];  Service: Gastroenterology;  Laterality: N/A;  . COLONOSCOPY WITH PROPOFOL N/A 10/01/2017   Procedure: COLONOSCOPY WITH PROPOFOL;  Surgeon: Doran Stabler, MD;  Location: [facility];  Service: Gastroenterology;  Laterality: N/A;  . COLONOSCOPY WITH PROPOFOL N/A 03/07/2018   Procedure: COLONOSCOPY WITH PROPOFOL;  Surgeon: [name], MD;  Location: [facility];  Service: Endoscopy;  Laterality: N/A;  . ESOPHAGOGASTRODUODENOSCOPY (EGD) WITH PROPOFOL N/A 10/01/2017   Procedure: ESOPHAGOGASTRODUODENOSCOPY (EGD) WITH PROPOFOL;  Surgeon: Doran Stabler, MD;  Location: [facility];  Service: Gastroenterology;  Laterality: N/A;  . EYE SURGERY Bilateral   . FINGER SURGERY Right    right thumb  . HARDWARE REMOVAL Right 05/15/2016   Procedure: Right Lumbar Two-Lumbar Five Removal of Hardware;  Surgeon: Kristeen Miss, MD;  Location: [facility];  Service: Neurosurgery;  Laterality: Right;  Right L2-5 removal of hardware  . INGUINAL HERNIA REPAIR     x4  . REPAIR SPIGELIAN HERNIA  2011  . [place] ,2014  . TONSILLECTOMY    . UPPER GASTROINTESTINAL ENDOSCOPY    . VASECTOMY      reports that he has never smoked. He has never used smokeless tobacco. He reports that he does not drink alcohol or use drugs. Social History   Socioeconomic History  . Marital status: Married    Spouse name: [name]  . Number of children: 3  . Years of education: College  . Highest education level: Not on file  Occupational History  . 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 Financial resource strain: Not on file  . Food insecurity:    Worry: Not on file    Inability: Not on file  . Transportation needs:    Medical: Not on file    Non-medical: Not on file  Tobacco Use  . Smoking status: Never Smoker  . Smokeless tobacco: Never Used  Substance and Sexual Activity  . Alcohol use: No  . Drug use: No    . Sexual activity: Never  Lifestyle  . Physical activity:    Days per week: Not on file    Minutes per session: Not on file  . Stress: Not on file  Relationships  . Social connections:    Talks on phone: Not on file    Gets together: Not on file    Attends religious service: Not on file    Active member of club or organization: Not on file    Attends meetings of clubs or organizations: Not on file    Relationship status: Not on file  . Intimate partner violence:    Fear of current or ex partner: Not on file    Emotionally abused: Not on file    Physically  abused: Not on file    Forced sexual activity: Not on file  Other Topics Concern  . Not on file  Social History Narrative   Patient is married [name]).   Patient is a retired Arboriculturist.   Patient lives in [facility].   Patient has three adult children.   Patient does not drink any caffeine.   Patient is right-handed.    Functional Status Survey:    Family History  Problem Relation Age of Onset  . Prostate cancer Father   . CAD Mother   . Arthritis-Osteo Brother   . Colon cancer Neg Hx   . Stomach cancer Neg Hx     Health Maintenance  Topic Date Due  . HEMOGLOBIN A1C  04-17-1931  . FOOT EXAM  08/13/1943  . OPHTHALMOLOGY EXAM  08/13/1943  . URINE MICROALBUMIN  08/13/1943  . TETANUS/TDAP  08/12/1952  . PNA vac Low Risk Adult (1 of 2 - PCV13) 08/12/1998  . INFLUENZA VACCINE  07/04/2018    Allergies  Allergen Reactions  . Celebrex [Celecoxib] Other (See Comments)    Created stomach ulcers.  Marland Kitchen Flexeril [Cyclobenzaprine] Other (See Comments)    Caught in hiatal hernia and caused extreme pain    Outpatient Encounter Medications as of 05/27/2018  Medication Sig  . acetaminophen (TYLENOL) 500 MG tablet Take 1,000 mg by mouth every 6 (six) hours as needed for mild pain. TAKES WITH TRAMADOL  . buPROPion (WELLBUTRIN SR) 150 MG 12 hr tablet Take 150 mg by mouth 2 (two) times daily.   .  carboxymethylcellul-glycerin (REFRESH REPAIR) 0.5-0.9 % ophthalmic solution Place 1 drop into both eyes 3 (three) times daily.  . cholecalciferol (VITAMIN D) 1000 UNITS tablet Take 1,000 Units by mouth daily.  Marland Kitchen docusate sodium (COLACE) 100 MG capsule Take 100 mg by mouth as needed for mild constipation.   . ferrous sulfate 325 (65 FE) MG EC tablet Take 325 mg by mouth daily with breakfast.  . gabapentin (NEURONTIN) 300 MG capsule Take 1 tablet PO at 4 PM  and 1 tablet at 10 PM  . HYDROcodone-acetaminophen (NORCO/VICODIN) 5-325 MG tablet Take 1-2 tablets by mouth every 4 (four) hours as needed.  Marland Kitchen ipratropium (ATROVENT) 0.03 % nasal spray Place 2 sprays into both nostrils every 12 (twelve) hours.  . lovastatin (MEVACOR) 20 MG tablet Take 20 mg by mouth at bedtime.   . Magnesium 250 MG TABS Take 250 mg by mouth every evening.   . magnesium hydroxide (MILK OF MAGNESIA) 400 MG/5ML suspension Take 5 mLs by mouth as needed for mild constipation.   . mesalamine (CANASA) 1000 MG suppository Place 1 suppository (1,000 mg total) rectally at bedtime.  . methocarbamol (ROBAXIN) 500 MG tablet Take 500 mg by mouth every 8 (eight) hours as needed for muscle spasms.  . Multiple Minerals-Vitamins (CITRACAL PLUS BONE DENSITY) TABS Take 1 tablet by mouth 2 (two) times daily.   . Multiple Vitamins-Minerals (CENTRUM ADULTS PO) Take 1 tablet by mouth daily.  . Multiple Vitamins-Minerals (PRESERVISION AREDS 2 PO) Take 1 capsule by mouth 2 (two) times daily.  Marland Kitchen omeprazole (PRILOSEC) 40 MG capsule TAKE 1 CAPSULE(40 MG) BY MOUTH TWICE DAILY  . propranolol (INDERAL) 10 MG tablet Take 10 mg by mouth daily.   . ranitidine (ZANTAC) 300 MG tablet TAKE 1 TABLET(300 MG) BY MOUTH AT BEDTIME  . Tamsulosin HCl (FLOMAX) 0.4 MG CAPS Take 0.4 mg by mouth 2 (two) times daily.   . traMADol (ULTRAM) 50 MG tablet Take 2  tablets (100 mg total) by mouth 2 (two) times daily as needed for moderate pain.   No facility-administered encounter  medications on file as of 05/27/2018.     Review of Systems  Constitutional: Positive for fatigue. Negative for appetite change, chills, diaphoresis and fever.  HENT: Negative for congestion, ear pain, hearing loss, rhinorrhea, sore throat and trouble swallowing.   Eyes: Negative for visual disturbance.  Respiratory: Negative for cough, shortness of breath and wheezing.   Cardiovascular: Negative for chest pain, palpitations and leg swelling.  Gastrointestinal: Negative for abdominal pain, blood in stool, constipation, diarrhea, nausea, rectal pain and vomiting.  Genitourinary: Negative for dysuria, frequency and hematuria.  Musculoskeletal: Positive for arthralgias, back pain and gait problem.  Skin: Negative for rash.  Neurological: Positive for weakness and numbness. Negative for dizziness, light-headedness and headaches.       Has chronic numbness to his feet  Psychiatric/Behavioral: Positive for decreased concentration. Negative for behavioral problems and confusion. The patient is nervous/anxious.     Vitals:   05/27/18 1144  BP: 130/77  Pulse: 78  Resp: 20  Temp: (!) 97.5 F (36.4 C)  TempSrc: Oral  SpO2: 96%  Weight: 167 lb 3.2 oz (75.8 kg)  Height: 5\' 6"  (1.676 m)   Body mass index is 26.99 kg/m. Physical Exam  Constitutional: He is oriented to person, place, and time. He appears well-developed. No distress.  Overweight elderly male in no acute distress  HENT:  Head: Normocephalic and atraumatic.  Right Ear: External ear normal.  Left Ear: External ear normal.  Mouth/Throat: Oropharynx is clear and moist.  Eyes: Pupils are equal, round, and reactive to light. Conjunctivae and EOM are normal. Right eye exhibits no discharge. Left eye exhibits no discharge.  Has corrective glasses  Neck: Normal range of motion. Neck supple.  Cardiovascular: Normal rate, regular rhythm and intact distal pulses.  Pulmonary/Chest: Effort normal and breath sounds normal. He has no  wheezes. He has no rales.  Uses CPAP at night time  Abdominal: Soft. Bowel sounds are normal. There is no tenderness. There is no guarding.  Musculoskeletal: He exhibits no edema.  Able to move all 4 extremities, generalized weakness  Lymphadenopathy:    He has no cervical adenopathy.  Neurological: He is alert and oriented to person, place, and time. No cranial nerve deficit. He exhibits normal muscle tone.  Skin: Skin is warm and dry. He is not diaphoretic.  Psychiatric: He has a normal mood and affect.    Labs reviewed: Basic Metabolic Panel: Recent Labs    09/30/17 0304 10/01/17 0525 10/03/17 2130  NA 137 144 140  K 3.8 3.7 3.8  CL 107 112* 109  CO2 24 23 23   GLUCOSE 104* 100* 93  BUN 21* 13 13  CREATININE 0.88 0.86 0.84  CALCIUM 8.7* 8.9 9.2   Liver Function Tests: Recent Labs    09/29/17 1941 10/01/17 0525  AST 53* 51*  ALT 23 24  ALKPHOS 76 62  BILITOT 0.4 0.7  PROT 6.8 6.9  ALBUMIN 3.8 3.9   No results for input(s): LIPASE, AMYLASE in the last 8760 hours. No results for input(s): AMMONIA in the last 8760 hours. CBC: Recent Labs    10/17/17 1100 02/28/18 1126 05/17/18 1524  WBC 5.1 6.1 7.3  NEUTROABS 3.7 4.5 5.6  HGB 11.2* 10.6* 12.0*  HCT 33.6* 31.6* 35.8*  MCV 91.0 89.5 87.7  PLT 223.0 178.0 197.0   Cardiac Enzymes: No results for input(s): CKTOTAL, CKMB, CKMBINDEX, TROPONINI in  the last 8760 hours. BNP: Invalid input(s): POCBNP No results found for: HGBA1C No results found for: TSH Lab Results  Component Value Date   VITAMINB12 314 07/25/2013   Lab Results  Component Value Date   FOLATE >24.8 07/25/2013   Lab Results  Component Value Date   IRON 19 (L) 07/25/2013   FERRITIN 8.1 (L) 07/25/2013    Imaging and Procedures obtained prior to SNF admission: Dg Lumbar Spine Complete  Result Date: 05/24/2018 CLINICAL DATA:  Pt c/o lower back pain x10 days since starting physical therapy. Hx chronic back pain and fusion. EXAM: LUMBAR SPINE  - COMPLETE 4+ VIEW COMPARISON:  CT 01/10/2017 FINDINGS: Postoperative changes are identified with LEFT-sided transpedicular fusion at L2-S1. Hardware is intact. There is stable anterolisthesis of L5 on S1, grade 1. Significant sclerosis identified at the disc space of L1-2. There is no acute fracture. IMPRESSION: Stable postoperative changes. Stable and significant degenerative changes at L1-2. Electronically Signed   By: Nolon Nations M.D.   On: 05/24/2018 14:06    Assessment/Plan  1. Unsteady gait Will have him work with physical therapy and occupational therapy team to help with gait training and muscle strengthening exercises.fall precautions. Skin care. Encourage to be out of bed.   2. Chronic midline low back pain with bilateral sciatica Continue tramadol 200 mg twice a day as needed, norco 5-325 mg 1-2 tablet q4h prn pain and robaxin 500 mg q8h prn muscle spasm. Back precautions. Will have patient work with PT/OT as tolerated to regain strength and restore function.  Fall precautions are in place. Continue bowel regimen and monitor for constipation. Continue tylenol 1000 mg q6h prn pain as well.   3. Essential hypertension Stable, off medication, monitor clinically  4. OSA on CPAP Continue CPAP and ipratropium nasal spray  5. Gastroesophageal reflux disease without esophagitis Continue ranitidine 300 mg qhs and omeprazole 40 mg bid for now and monitor  6. Radiation proctitis On mesalamine suppository. Also get argon plasma coagulation with GI.   7. Depression, unspecified depression type Monitor his mood. Continue bupropion 150 mg bid  8. History of prostate cancer S/p radiation therapy. Continue flomax  9. Anemia unspecified On ferrous sulfate 325 mg daily, check cbc  10. RLS On magnesium supplement with gabapentin 300 mg bid  11. Hyperlipidemia Continue lovastatin, check lipid and LFT  Unclear why patient is on low dose propranolol. Will obtain medical records from  his PCP Dr Sharlett Iles with [facility] for review.    Family/ staff Communication: reviewed care plan with patient, his wife over the phone and charge nurse.    Labs/tests ordered: cbc, cmp, lipid  Blanchie Serve, MD Internal Medicine [facility] [address] [place], [place] [address] Cell Phone (Monday-Friday 8 am - 5 pm): [phone] On Call: [phone] and follow prompts after 5 pm and on weekends Office Phone: [phone] Office Fax: [phone]   Addendum: reviewed records from PCP. To start requip, new dosing of gabapentin, changes to dosing of flomax and magnesium supplement made, Epic med list will be updated.

## 2018-05-28 DIAGNOSIS — M545 Low back pain: Secondary | ICD-10-CM | POA: Diagnosis not present

## 2018-05-28 DIAGNOSIS — M5441 Lumbago with sciatica, right side: Secondary | ICD-10-CM | POA: Diagnosis not present

## 2018-05-28 DIAGNOSIS — R29898 Other symptoms and signs involving the musculoskeletal system: Secondary | ICD-10-CM | POA: Diagnosis not present

## 2018-05-28 DIAGNOSIS — M5442 Lumbago with sciatica, left side: Secondary | ICD-10-CM | POA: Diagnosis not present

## 2018-05-28 DIAGNOSIS — M6281 Muscle weakness (generalized): Secondary | ICD-10-CM | POA: Diagnosis not present

## 2018-05-28 DIAGNOSIS — R2681 Unsteadiness on feet: Secondary | ICD-10-CM | POA: Diagnosis not present

## 2018-05-29 DIAGNOSIS — R2681 Unsteadiness on feet: Secondary | ICD-10-CM | POA: Diagnosis not present

## 2018-05-29 DIAGNOSIS — R29898 Other symptoms and signs involving the musculoskeletal system: Secondary | ICD-10-CM | POA: Diagnosis not present

## 2018-05-29 DIAGNOSIS — M5441 Lumbago with sciatica, right side: Secondary | ICD-10-CM | POA: Diagnosis not present

## 2018-05-29 DIAGNOSIS — M545 Low back pain: Secondary | ICD-10-CM | POA: Diagnosis not present

## 2018-05-29 DIAGNOSIS — I2510 Atherosclerotic heart disease of native coronary artery without angina pectoris: Secondary | ICD-10-CM | POA: Insufficient documentation

## 2018-05-29 DIAGNOSIS — M6281 Muscle weakness (generalized): Secondary | ICD-10-CM | POA: Diagnosis not present

## 2018-05-29 DIAGNOSIS — M5442 Lumbago with sciatica, left side: Secondary | ICD-10-CM | POA: Diagnosis not present

## 2018-05-30 DIAGNOSIS — E785 Hyperlipidemia, unspecified: Secondary | ICD-10-CM | POA: Diagnosis not present

## 2018-05-30 DIAGNOSIS — R2681 Unsteadiness on feet: Secondary | ICD-10-CM | POA: Diagnosis not present

## 2018-05-30 DIAGNOSIS — D649 Anemia, unspecified: Secondary | ICD-10-CM | POA: Diagnosis not present

## 2018-05-30 DIAGNOSIS — I10 Essential (primary) hypertension: Secondary | ICD-10-CM | POA: Diagnosis not present

## 2018-05-30 DIAGNOSIS — M545 Low back pain: Secondary | ICD-10-CM | POA: Diagnosis not present

## 2018-05-30 DIAGNOSIS — M5441 Lumbago with sciatica, right side: Secondary | ICD-10-CM | POA: Diagnosis not present

## 2018-05-30 DIAGNOSIS — M5442 Lumbago with sciatica, left side: Secondary | ICD-10-CM | POA: Diagnosis not present

## 2018-05-30 DIAGNOSIS — M6281 Muscle weakness (generalized): Secondary | ICD-10-CM | POA: Diagnosis not present

## 2018-05-30 DIAGNOSIS — R29898 Other symptoms and signs involving the musculoskeletal system: Secondary | ICD-10-CM | POA: Diagnosis not present

## 2018-05-30 LAB — CBC AND DIFFERENTIAL
HEMATOCRIT: 36 — AB (ref 41–53)
HEMOGLOBIN: 11.9 — AB (ref 13.5–17.5)
PLATELETS: 312 (ref 150–399)
WBC: 6.9

## 2018-05-30 LAB — LIPID PANEL
Cholesterol: 141 (ref 0–200)
HDL: 34 — AB (ref 35–70)
LDL CALC: 88
TRIGLYCERIDES: 91 (ref 40–160)

## 2018-05-30 LAB — BASIC METABOLIC PANEL
BUN: 22 — AB (ref 4–21)
Creatinine: 0.8 (ref 0.6–1.3)
GLUCOSE: 91
POTASSIUM: 4.3 (ref 3.4–5.3)
SODIUM: 138 (ref 137–147)

## 2018-05-30 LAB — HEPATIC FUNCTION PANEL
ALT: 16 (ref 10–40)
AST: 32 (ref 14–40)
Alkaline Phosphatase: 112 (ref 25–125)
BILIRUBIN, TOTAL: 0.3

## 2018-05-31 LAB — CMP 10231
Albumin: 3.3
Calcium: 8.6
Carbon Dioxide, Total: 26
Chloride: 104
EGFR (Non-African Amer.): 83
Globulin: 2.9
Total Protein: 6.2

## 2018-06-02 DIAGNOSIS — M5441 Lumbago with sciatica, right side: Secondary | ICD-10-CM | POA: Diagnosis not present

## 2018-06-02 DIAGNOSIS — M5442 Lumbago with sciatica, left side: Secondary | ICD-10-CM | POA: Diagnosis not present

## 2018-06-02 DIAGNOSIS — R2681 Unsteadiness on feet: Secondary | ICD-10-CM | POA: Diagnosis not present

## 2018-06-02 DIAGNOSIS — R29898 Other symptoms and signs involving the musculoskeletal system: Secondary | ICD-10-CM | POA: Diagnosis not present

## 2018-06-02 DIAGNOSIS — M545 Low back pain: Secondary | ICD-10-CM | POA: Diagnosis not present

## 2018-06-02 DIAGNOSIS — M6281 Muscle weakness (generalized): Secondary | ICD-10-CM | POA: Diagnosis not present

## 2018-06-03 DIAGNOSIS — Z8546 Personal history of malignant neoplasm of prostate: Secondary | ICD-10-CM | POA: Diagnosis not present

## 2018-06-03 DIAGNOSIS — G2581 Restless legs syndrome: Secondary | ICD-10-CM | POA: Diagnosis not present

## 2018-06-03 DIAGNOSIS — R2681 Unsteadiness on feet: Secondary | ICD-10-CM | POA: Diagnosis not present

## 2018-06-03 DIAGNOSIS — K627 Radiation proctitis: Secondary | ICD-10-CM | POA: Diagnosis not present

## 2018-06-03 DIAGNOSIS — G8929 Other chronic pain: Secondary | ICD-10-CM | POA: Diagnosis not present

## 2018-06-03 DIAGNOSIS — M5442 Lumbago with sciatica, left side: Secondary | ICD-10-CM | POA: Diagnosis not present

## 2018-06-03 DIAGNOSIS — M545 Low back pain: Secondary | ICD-10-CM | POA: Diagnosis not present

## 2018-06-03 DIAGNOSIS — Z9989 Dependence on other enabling machines and devices: Secondary | ICD-10-CM | POA: Diagnosis not present

## 2018-06-03 DIAGNOSIS — M6281 Muscle weakness (generalized): Secondary | ICD-10-CM | POA: Diagnosis not present

## 2018-06-03 DIAGNOSIS — M5441 Lumbago with sciatica, right side: Secondary | ICD-10-CM | POA: Diagnosis not present

## 2018-06-03 DIAGNOSIS — I10 Essential (primary) hypertension: Secondary | ICD-10-CM | POA: Diagnosis not present

## 2018-06-03 DIAGNOSIS — E785 Hyperlipidemia, unspecified: Secondary | ICD-10-CM | POA: Diagnosis not present

## 2018-06-03 DIAGNOSIS — N400 Enlarged prostate without lower urinary tract symptoms: Secondary | ICD-10-CM | POA: Diagnosis not present

## 2018-06-03 DIAGNOSIS — R29898 Other symptoms and signs involving the musculoskeletal system: Secondary | ICD-10-CM | POA: Diagnosis not present

## 2018-06-04 DIAGNOSIS — M545 Low back pain: Secondary | ICD-10-CM | POA: Diagnosis not present

## 2018-06-04 DIAGNOSIS — R29898 Other symptoms and signs involving the musculoskeletal system: Secondary | ICD-10-CM | POA: Diagnosis not present

## 2018-06-04 DIAGNOSIS — R2681 Unsteadiness on feet: Secondary | ICD-10-CM | POA: Diagnosis not present

## 2018-06-04 DIAGNOSIS — M5442 Lumbago with sciatica, left side: Secondary | ICD-10-CM | POA: Diagnosis not present

## 2018-06-04 DIAGNOSIS — M5441 Lumbago with sciatica, right side: Secondary | ICD-10-CM | POA: Diagnosis not present

## 2018-06-04 DIAGNOSIS — M6281 Muscle weakness (generalized): Secondary | ICD-10-CM | POA: Diagnosis not present

## 2018-06-05 DIAGNOSIS — M6281 Muscle weakness (generalized): Secondary | ICD-10-CM | POA: Diagnosis not present

## 2018-06-05 DIAGNOSIS — M545 Low back pain: Secondary | ICD-10-CM | POA: Diagnosis not present

## 2018-06-05 DIAGNOSIS — M5441 Lumbago with sciatica, right side: Secondary | ICD-10-CM | POA: Diagnosis not present

## 2018-06-05 DIAGNOSIS — M5442 Lumbago with sciatica, left side: Secondary | ICD-10-CM | POA: Diagnosis not present

## 2018-06-05 DIAGNOSIS — R2681 Unsteadiness on feet: Secondary | ICD-10-CM | POA: Diagnosis not present

## 2018-06-05 DIAGNOSIS — R29898 Other symptoms and signs involving the musculoskeletal system: Secondary | ICD-10-CM | POA: Diagnosis not present

## 2018-06-06 ENCOUNTER — Encounter: Payer: Self-pay | Admitting: Internal Medicine

## 2018-06-06 ENCOUNTER — Non-Acute Institutional Stay (SKILLED_NURSING_FACILITY): Payer: Medicare Other | Admitting: Internal Medicine

## 2018-06-06 DIAGNOSIS — F329 Major depressive disorder, single episode, unspecified: Secondary | ICD-10-CM | POA: Diagnosis not present

## 2018-06-06 DIAGNOSIS — Z9989 Dependence on other enabling machines and devices: Secondary | ICD-10-CM | POA: Diagnosis not present

## 2018-06-06 DIAGNOSIS — G8929 Other chronic pain: Secondary | ICD-10-CM

## 2018-06-06 DIAGNOSIS — G250 Essential tremor: Secondary | ICD-10-CM

## 2018-06-06 DIAGNOSIS — M5442 Lumbago with sciatica, left side: Secondary | ICD-10-CM | POA: Diagnosis not present

## 2018-06-06 DIAGNOSIS — K219 Gastro-esophageal reflux disease without esophagitis: Secondary | ICD-10-CM

## 2018-06-06 DIAGNOSIS — G4733 Obstructive sleep apnea (adult) (pediatric): Secondary | ICD-10-CM

## 2018-06-06 DIAGNOSIS — F32A Depression, unspecified: Secondary | ICD-10-CM

## 2018-06-06 DIAGNOSIS — R2681 Unsteadiness on feet: Secondary | ICD-10-CM

## 2018-06-06 DIAGNOSIS — M5441 Lumbago with sciatica, right side: Secondary | ICD-10-CM | POA: Diagnosis not present

## 2018-06-06 NOTE — Progress Notes (Signed)
Location:  [place] of Service:  [id number])  Provider: Blanchie Serve MD  PCP: Leanna Battles, MD Patient Care Team: Leanna Battles, MD as PCP - General (Internal Medicine) [name], MD (Hematology and Oncology) [name], MD as Attending Physician (Urology) [name], MD (Gastroenterology)  Extended Emergency Contact Information Primary Emergency Contact: Struckman,Janet H Address: [address]          [address]          [place], [place] [address] [name] of [place] Phone: [phone] Mobile Phone: [phone] Relation: Spouse  Code Status: full code  Goals of care:  Advanced Directive information Advanced Directives 05/27/2018  Does Patient Have a Medical Advance Directive? Yes  Type of Paramedic of [place];Living will  Does patient want to make changes to medical advance directive? No - Patient declined  Copy of [place] in Chart? No - copy requested  Would patient like information on creating a medical advance directive? -     Allergies  Allergen Reactions  . Celebrex [Celecoxib] Other (See Comments)    Created stomach ulcers.  Marland Kitchen Flexeril [Cyclobenzaprine] Other (See Comments)    Caught in hiatal hernia and caused extreme pain    Chief Complaint  Patient presents with  . Discharge Note    discharge visit    HPI:  84 y.o. male  Patient is seen today for discharge visit. He has been here for short term rehabilitation given his deconditioning after ED visit on 05/24/18. He had acute on chronic low back pain thought to be musculoskeletal in origin from possible strain. He has worked with PT and OT team at the facility and has made improvement in his strength and gait. He has back pain and grades it 2/10 at present. Current pain regimen with neurontin, prn tramadol and prn norco has been helpful for him. He would like to return to his apartment in independent living. He  lives there with his wife. He denies any acute concern today.  Reviewed medication list. Reviewed lab work from during his stay at the facility. On iron supplement for his anemia.     Past Medical History:  Diagnosis Date  . Anemia   . Arthritis   . AVM (arteriovenous malformation)   . Barrett's esophagus   . BPH (benign prostatic hyperplasia)   . CAD (coronary artery disease)    only on stress test; never had MI, PCI or CABG.  In 2008, LVEF was reportedly normal. pt. denies  . Cataract   . Constipation   . CPAP (continuous positive airway pressure) dependence   . Depression   . Diverticulosis    diverticular bleeding  . Diverticulosis   . GERD (gastroesophageal reflux disease)   . History of radiation therapy   . Hyperlipidemia   . Hypoglycemia   . Lymphocytosis 06/2012  . Mastodynia   . Murmur    slight  . Nasal congestion   . Osteoporosis   . Osteoporosis   . Prostate cancer ([place])   . Sleep apnea    wears CPAP  . Urinary retention with incomplete bladder emptying    pt uses bathroom and within 30 mins needs to go again  . Vitamin D deficiency     Past Surgical History:  Procedure Laterality Date  . AMPUTATION TOE Right 09/20/2017   Procedure: Right 4th toe amputation;  Surgeon: Wylene Simmer, MD;  Location: [facility];  Service:  Orthopedics;  Laterality: Right;  foot block  . BACK SURGERY  04/2013  . back surgury     f  . BLEPHAROPLASTY Bilateral 02/23/15  . CATARACT EXTRACTION    . COLONOSCOPY    . COLONOSCOPY N/A 10/02/2017   Procedure: COLONOSCOPY;  Surgeon: Doran Stabler, MD;  Location: [facility];  Service: Gastroenterology;  Laterality: N/A;  . COLONOSCOPY WITH PROPOFOL N/A 10/01/2017   Procedure: COLONOSCOPY WITH PROPOFOL;  Surgeon: Doran Stabler, MD;  Location: [facility];  Service: Gastroenterology;  Laterality: N/A;  . COLONOSCOPY WITH PROPOFOL N/A 03/07/2018   Procedure: COLONOSCOPY WITH PROPOFOL;  Surgeon: [name],  MD;  Location: [facility];  Service: Endoscopy;  Laterality: N/A;  . ESOPHAGOGASTRODUODENOSCOPY (EGD) WITH PROPOFOL N/A 10/01/2017   Procedure: ESOPHAGOGASTRODUODENOSCOPY (EGD) WITH PROPOFOL;  Surgeon: Doran Stabler, MD;  Location: [facility];  Service: Gastroenterology;  Laterality: N/A;  . EYE SURGERY Bilateral   . FINGER SURGERY Right    right thumb  . HARDWARE REMOVAL Right 05/15/2016   Procedure: Right Lumbar Two-Lumbar Five Removal of Hardware;  Surgeon: Kristeen Miss, MD;  Location: [facility];  Service: Neurosurgery;  Laterality: Right;  Right L2-5 removal of hardware  . INGUINAL HERNIA REPAIR     x4  . REPAIR SPIGELIAN HERNIA  2011  . [place] ,2014  . TONSILLECTOMY    . UPPER GASTROINTESTINAL ENDOSCOPY    . VASECTOMY        reports that he has never smoked. He has never used smokeless tobacco. He reports that he does not drink alcohol or use drugs. Social History   Socioeconomic History  . Marital status: Married    Spouse name: [name]  . Number of children: 3  . Years of education: College  . Highest education level: Not on file  Occupational History  . 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 Financial resource strain: Not on file  . Food insecurity:    Worry: Not on file    Inability: Not on file  . Transportation needs:    Medical: Not on file    Non-medical: Not on file  Tobacco Use  . Smoking status: Never Smoker  . Smokeless tobacco: Never Used  Substance and Sexual Activity  . Alcohol use: No  . Drug use: No  . Sexual activity: Never  Lifestyle  . Physical activity:    Days per week: Not on file    Minutes per session: Not on file  . Stress: Not on file  Relationships  . Social connections:    Talks on phone: Not on file    Gets together: Not on file    Attends religious service: Not on file    Active member of club or organization: Not on file    Attends meetings of clubs or  organizations: Not on file    Relationship status: Not on file  . Intimate partner violence:    Fear of current or ex partner: Not on file    Emotionally abused: Not on file    Physically abused: Not on file    Forced sexual activity: Not on file  Other Topics Concern  . Not on file  Social History Narrative   Patient is married [name]).   Patient is a retired Arboriculturist.   Patient lives in [facility].   Patient has three adult children.   Patient does not drink  any caffeine.   Patient is right-handed.   Functional Status Survey:    Allergies  Allergen Reactions  . Celebrex [Celecoxib] Other (See Comments)    Created stomach ulcers.  Marland Kitchen Flexeril [Cyclobenzaprine] Other (See Comments)    Caught in hiatal hernia and caused extreme pain    Pertinent  Health Maintenance Due  Topic Date Due  . HEMOGLOBIN A1C  02-19-1931  . FOOT EXAM  08/13/1943  . OPHTHALMOLOGY EXAM  08/13/1943  . URINE MICROALBUMIN  08/13/1943  . PNA vac Low Risk Adult (1 of 2 - PCV13) 08/12/1998  . INFLUENZA VACCINE  07/04/2018    Medications: Outpatient Encounter Medications as of 06/06/2018  Medication Sig  . acetaminophen (TYLENOL) 500 MG tablet Take 1,000 mg by mouth every 6 (six) hours as needed for mild pain. TAKES WITH TRAMADOL  . aspirin 81 MG chewable tablet Chew 81 mg by mouth daily.  Marland Kitchen buPROPion (WELLBUTRIN SR) 150 MG 12 hr tablet Take 150 mg by mouth 2 (two) times daily.   . carboxymethylcellul-glycerin (REFRESH REPAIR) 0.5-0.9 % ophthalmic solution Place 1 drop into both eyes 3 (three) times daily.  . cholecalciferol (VITAMIN D) 1000 UNITS tablet Take 1,000 Units by mouth daily.  Marland Kitchen docusate sodium (COLACE) 100 MG capsule Take 100 mg by mouth as needed for mild constipation.   . ferrous sulfate 325 (65 FE) MG EC tablet Take 325 mg by mouth daily with breakfast.  . gabapentin (NEURONTIN) 300 MG capsule Take 1 tablet PO at 4 PM  and 1 tablet at 10 PM (Patient taking differently:  Take 300 mg by mouth. Take 1 tablet PO at 8 am and 2 tablet po at 4 pm and 10 pM)  . HYDROcodone-acetaminophen (NORCO/VICODIN) 5-325 MG tablet Take 1-2 tablets by mouth every 4 (four) hours as needed.  Marland Kitchen ipratropium (ATROVENT) 0.03 % nasal spray Place 2 sprays into both nostrils every 12 (twelve) hours.  . lovastatin (MEVACOR) 20 MG tablet Take 20 mg by mouth at bedtime.   . Magnesium 250 MG TABS Take 250 mg by mouth every evening.   . magnesium hydroxide (MILK OF MAGNESIA) 400 MG/5ML suspension Take 5 mLs by mouth as needed for mild constipation.   . methocarbamol (ROBAXIN) 500 MG tablet Take 500 mg by mouth every 8 (eight) hours as needed for muscle spasms.  . Multiple Minerals-Vitamins (CITRACAL PLUS BONE DENSITY) TABS Take 1 tablet by mouth 2 (two) times daily.   . Multiple Vitamins-Minerals (CENTRUM ADULTS PO) Take 1 tablet by mouth daily.  . Multiple Vitamins-Minerals (PRESERVISION AREDS 2 PO) Take 1 capsule by mouth 2 (two) times daily.  Marland Kitchen omeprazole (PRILOSEC) 40 MG capsule TAKE 1 CAPSULE(40 MG) BY MOUTH TWICE DAILY  . propranolol (INDERAL) 10 MG tablet Take 10 mg by mouth daily.   . ranitidine (ZANTAC) 300 MG tablet TAKE 1 TABLET(300 MG) BY MOUTH AT BEDTIME  . rOPINIRole (REQUIP) 2 MG tablet Take 2 mg by mouth at bedtime.  . senna-docusate (SENOKOT-S) 8.6-50 MG tablet Take 1 tablet by mouth at bedtime as needed for mild constipation.  . Tamsulosin HCl (FLOMAX) 0.4 MG CAPS Take 0.4 mg by mouth 2 (two) times daily.   . traMADol (ULTRAM) 50 MG tablet Take 2 tablets (100 mg total) by mouth 2 (two) times daily as needed for moderate pain.  . mesalamine (CANASA) 1000 MG suppository Place 1 suppository (1,000 mg total) rectally at bedtime. (Patient not taking: Reported on 06/06/2018)   No facility-administered encounter medications on file as of 06/06/2018.  Review of Systems  Constitutional: Negative for appetite change, chills, diaphoresis, fatigue and fever.  HENT: Negative for  congestion, mouth sores and rhinorrhea.   Eyes: Positive for visual disturbance.  Respiratory: Negative for cough and shortness of breath.   Cardiovascular: Negative for chest pain and palpitations.  Gastrointestinal: Positive for constipation. Negative for abdominal pain, blood in stool, diarrhea, nausea and vomiting.  Genitourinary: Negative for dysuria and flank pain.  Musculoskeletal: Positive for arthralgias, back pain and gait problem.       Using walker at the facility with therapy and by self  Skin: Negative for rash.  Neurological: Positive for weakness and numbness. Negative for dizziness, light-headedness and headaches.       Has chronic numbness to his legs and feet. Has some weakness to his legs but feels stronger since time of admission  Psychiatric/Behavioral: Negative for agitation and confusion.    Vitals:   06/06/18 1017  BP: 138/71  Pulse: 68  Resp: 16  Temp: (!) 97.5 F (36.4 C)  SpO2: 94%  Weight: 167 lb 12.8 oz (76.1 kg)  Height: 5\' 6"  (1.676 m)   Body mass index is 27.08 kg/m. Physical Exam  Constitutional: He is oriented to person, place, and time. No distress.  Overweight elderly male  HENT:  Head: Normocephalic and atraumatic.  Right Ear: External ear normal.  Left Ear: External ear normal.  Nose: Nose normal.  Mouth/Throat: Oropharynx is clear and moist. No oropharyngeal exudate.  Eyes: Pupils are equal, round, and reactive to light. Conjunctivae and EOM are normal. Right eye exhibits no discharge. Left eye exhibits no discharge.  Neck: Normal range of motion. Neck supple.  Cardiovascular: Normal rate and regular rhythm.  Pulmonary/Chest: Effort normal and breath sounds normal. He has no wheezes. He has no rales.  Abdominal: Soft. Bowel sounds are normal.  Musculoskeletal: He exhibits no edema.  Able to move all 4 extremities, adequate strength and tone. Unsteady gait. Using walker for ambulation. No spinal tenderness, some paraspinal tenderness on  exam in lumbar region  Lymphadenopathy:    He has no cervical adenopathy.  Neurological: He is alert and oriented to person, place, and time.  Skin: Skin is warm and dry. He is not diaphoretic.    Labs reviewed: Basic Metabolic Panel: Recent Labs    09/30/17 0304 10/01/17 0525 10/03/17 2130 12/21/17 03/12/18 05/30/18  NA 137 144 140 139 138 138  K 3.8 3.7 3.8 4.5 4.9 4.3  CL 107 112* 109  --   --  104  CO2 24 23 23   --   --  26  GLUCOSE 104* 100* 93  --   --   --   BUN 21* 13 13 20 19  22*  CREATININE 0.88 0.86 0.84 0.9 0.8 0.8  CALCIUM 8.7* 8.9 9.2  --   --  8.6   Liver Function Tests: Recent Labs    09/29/17 1941 10/01/17 0525 05/30/18  AST 53* 51* 32  ALT 23 24 16   ALKPHOS 76 62 112  BILITOT 0.4 0.7  --   PROT 6.8 6.9 6.2  ALBUMIN 3.8 3.9 3.3   No results for input(s): LIPASE, AMYLASE in the last 8760 hours. No results for input(s): AMMONIA in the last 8760 hours. CBC: Recent Labs    10/17/17 1100  02/28/18 1126  04/30/18 05/17/18 1524 05/30/18  WBC 5.1   < > 6.1   < > 5.4 7.3 6.9  NEUTROABS 3.7  --  4.5  --   --  5.6  --   HGB 11.2*   < > 10.6*   < > 11.9* 12.0* 11.9*  HCT 33.6*   < > 31.6*   < > 38* 35.8* 36*  MCV 91.0  --  89.5  --   --  87.7  --   PLT 223.0   < > 178.0   < > 193 197.0 312   < > = values in this interval not displayed.   Cardiac Enzymes: No results for input(s): CKTOTAL, CKMB, CKMBINDEX, TROPONINI in the last 8760 hours. BNP: Invalid input(s): POCBNP CBG: Recent Labs    09/30/17 0822  GLUCAP 93    Procedures and Imaging Studies During Stay: Dg Lumbar Spine Complete  Result Date: 05/24/2018 CLINICAL DATA:  Pt c/o lower back pain x10 days since starting physical therapy. Hx chronic back pain and fusion. EXAM: LUMBAR SPINE - COMPLETE 4+ VIEW COMPARISON:  CT 01/10/2017 FINDINGS: Postoperative changes are identified with LEFT-sided transpedicular fusion at L2-S1. Hardware is intact. There is stable anterolisthesis of L5 on S1, grade 1.  Significant sclerosis identified at the disc space of L1-2. There is no acute fracture. IMPRESSION: Stable postoperative changes. Stable and significant degenerative changes at L1-2. Electronically Signed   By: Nolon Nations M.D.   On: 05/24/2018 14:06    Assessment/Plan:    1. Unsteady gait Will have him continue to work with physical therapy and occupational therapy team to help with gait training and muscle strengthening exercises.fall precautions.   2. Chronic bilateral low back pain with bilateral sciatica Continue tylenol with tramadol and norco as needed. To f/u with Dr Sharlett Iles. No script for controlled substance provided as pt has required supply at home. Back precautions. Continue to work with PT and OT services at home.   3. OSA on CPAP Continue CPAP  4. Gastroesophageal reflux disease without esophagitis Continue ommeprazole and ranitidine  5. Tremor, essential C/w propranolol  6. Depression, unspecified depression type C/w wellbutrin   Patient is being discharged with the following home health services:  PT and OT with [facility] to help with gait training, strengthening exercises, safe transfer/ mobility  Patient is being discharged with the following durable medical equipment: none, has his own walker   Patient has been advised to f/u with their PCP in 1-2 weeks to for a transitions of care visit.  Social services at their facility was responsible for arranging this appointment.  Patient is being sent home with remaining medication from the med cart. He mentions that he has enough of his prescription medication at ome and does not need another script from [place]. His wife is present during the visit and agrees with this. No prescription was provided per pt request.   Future labs/tests needed:  Routine follow up with PCP in 1-2 weeks.   Family/ staff Communication: reviewed care plan with patient, his wife and charge nurse.  Blanchie Serve, MD Internal  Medicine [facility] [address] [place], [place] [address] Cell Phone (Monday-Friday 8 am - 5 pm): [phone] On Call: [phone] and follow prompts after 5 pm and on weekends Office Phone: [phone] Office Fax: [phone]

## 2018-06-09 DIAGNOSIS — M545 Low back pain: Secondary | ICD-10-CM | POA: Diagnosis not present

## 2018-06-09 DIAGNOSIS — R2681 Unsteadiness on feet: Secondary | ICD-10-CM | POA: Diagnosis not present

## 2018-06-09 DIAGNOSIS — M5441 Lumbago with sciatica, right side: Secondary | ICD-10-CM | POA: Diagnosis not present

## 2018-06-09 DIAGNOSIS — M5442 Lumbago with sciatica, left side: Secondary | ICD-10-CM | POA: Diagnosis not present

## 2018-06-10 ENCOUNTER — Encounter: Payer: Self-pay | Admitting: Adult Health

## 2018-06-11 DIAGNOSIS — R2681 Unsteadiness on feet: Secondary | ICD-10-CM | POA: Diagnosis not present

## 2018-06-11 DIAGNOSIS — M545 Low back pain: Secondary | ICD-10-CM | POA: Diagnosis not present

## 2018-06-11 DIAGNOSIS — M5441 Lumbago with sciatica, right side: Secondary | ICD-10-CM | POA: Diagnosis not present

## 2018-06-11 DIAGNOSIS — M5442 Lumbago with sciatica, left side: Secondary | ICD-10-CM | POA: Diagnosis not present

## 2018-06-12 ENCOUNTER — Ambulatory Visit (INDEPENDENT_AMBULATORY_CARE_PROVIDER_SITE_OTHER): Payer: Medicare Other | Admitting: Adult Health

## 2018-06-12 ENCOUNTER — Encounter: Payer: Self-pay | Admitting: Adult Health

## 2018-06-12 VITALS — BP 138/66 | HR 66 | Ht 66.0 in | Wt 170.0 lb

## 2018-06-12 DIAGNOSIS — G4733 Obstructive sleep apnea (adult) (pediatric): Secondary | ICD-10-CM | POA: Diagnosis not present

## 2018-06-12 DIAGNOSIS — G608 Other hereditary and idiopathic neuropathies: Secondary | ICD-10-CM | POA: Diagnosis not present

## 2018-06-12 DIAGNOSIS — G6289 Other specified polyneuropathies: Secondary | ICD-10-CM

## 2018-06-12 DIAGNOSIS — I2510 Atherosclerotic heart disease of native coronary artery without angina pectoris: Secondary | ICD-10-CM | POA: Diagnosis not present

## 2018-06-12 NOTE — Progress Notes (Signed)
PATIENT: MARIA GALLICCHIO DOB: [birth year]  REASON FOR VISIT: follow up HISTORY FROM: patient  HISTORY OF PRESENT ILLNESS: Today 06/12/18 Mr. Marhefka is an 84 year old male with a history of obstructive sleep apnea, restless leg syndrome and chronic back pain.  He returns today for follow-up.  He remains on gabapentin for neuropathy.  He reports that he does not have any burning in the lower extremities.  He does report numbness but mainly in the right lower extremity.  Denies any significant changes with his gait or balance.  He does use a walker when ambulating.  Denies any falls.  His CPAP download indicates that he uses machine 25 out of 30 days for compliance of 83%.  He uses machine greater than 4 hours every night that he use the machine.  On average he uses his machine 8 hours and 15 minutes.  His residual AHI is 9.5 on 8 to 15 cm of water.  His leak in the 95th percentile is 23.5 L/min.  His average pressure is 14.7 cm of water.  The patient states that he does not feel the mask leaking at night he states that there are occasions that the tubing disconnect from the mask.  He denies any new neurological symptoms.  He returns today for an evaluation.  HISTORY 09/19/17  Mr. Saavedra is an 84 year old male with a history of obstructive sleep apnea, restless leg syndrome and chronic back pain. He returns today for follow-up. The patient had EMG studies completed at the last visit that did confirm an axonal peripheral neuropathy and chronic radiculopathy at L4-L5. The patient states that he has been seen by Dr. Louanne Skye as well as Dr. Ellene Route for back pain. At this time they've not been able to offer him any additional interventions. He reports that he continues to have significant back pain typically radiates down the legs. He does report numbness in the feet. He states that he can only walk short distances before he feels that his stamina is gone.    REVIEW OF SYSTEMS: Out of a complete  14 system review of symptoms, the patient complains only of the following symptoms, and all other reviewed systems are negative.   See HPI  ALLERGIES: Allergies  Allergen Reactions  . Celebrex [Celecoxib] Other (See Comments)    Created stomach ulcers.  Marland Kitchen Flexeril [Cyclobenzaprine] Other (See Comments)    Caught in hiatal hernia and caused extreme pain    HOME MEDICATIONS: Outpatient Medications Prior to Visit  Medication Sig Dispense Refill  . acetaminophen (TYLENOL) 500 MG tablet Take 1,000 mg by mouth every 6 (six) hours as needed for mild pain. TAKES WITH TRAMADOL    . buPROPion (WELLBUTRIN SR) 150 MG 12 hr tablet Take 150 mg by mouth 2 (two) times daily.     . carboxymethylcellul-glycerin (REFRESH REPAIR) 0.5-0.9 % ophthalmic solution Place 1 drop into both eyes 3 (three) times daily.    . cholecalciferol (VITAMIN D) 1000 UNITS tablet Take 1,000 Units by mouth daily.    Marland Kitchen docusate sodium (COLACE) 100 MG capsule Take 100 mg by mouth as needed for mild constipation.     . ferrous sulfate 325 (65 FE) MG EC tablet Take 325 mg by mouth daily with breakfast.    . gabapentin (NEURONTIN) 300 MG capsule Take 1 tablet PO at 4 PM  and 1 tablet at 10 PM (Patient taking differently: Take 300 mg by mouth. Take 1 tablet PO at 8 am and 2 tablet po at 4  pm and 10 pM) 180 capsule 3  . HYDROcodone-acetaminophen (NORCO/VICODIN) 5-325 MG tablet Take 1-2 tablets by mouth every 4 (four) hours as needed. 20 tablet 0  . ipratropium (ATROVENT) 0.03 % nasal spray Place 2 sprays into both nostrils every 12 (twelve) hours.    . lovastatin (MEVACOR) 20 MG tablet Take 20 mg by mouth at bedtime.     . Magnesium 250 MG TABS Take 250 mg by mouth every evening.     . magnesium hydroxide (MILK OF MAGNESIA) 400 MG/5ML suspension Take 5 mLs by mouth as needed for mild constipation.     . methocarbamol (ROBAXIN) 500 MG tablet Take 500 mg by mouth every 8 (eight) hours as needed for muscle spasms.    . Multiple  Minerals-Vitamins (CITRACAL PLUS BONE DENSITY) TABS Take 1 tablet by mouth 2 (two) times daily.     . Multiple Vitamins-Minerals (CENTRUM ADULTS PO) Take 1 tablet by mouth daily.    . Multiple Vitamins-Minerals (PRESERVISION AREDS 2 PO) Take 1 capsule by mouth 2 (two) times daily.    Marland Kitchen omeprazole (PRILOSEC) 40 MG capsule TAKE 1 CAPSULE(40 MG) BY MOUTH TWICE DAILY 180 capsule 1  . propranolol (INDERAL) 10 MG tablet Take 10 mg by mouth daily.     . ranitidine (ZANTAC) 300 MG tablet TAKE 1 TABLET(300 MG) BY MOUTH AT BEDTIME 90 tablet 1  . rOPINIRole (REQUIP) 2 MG tablet Take 2 mg by mouth at bedtime.    . senna-docusate (SENOKOT-S) 8.6-50 MG tablet Take 1 tablet by mouth at bedtime as needed for mild constipation.    . Tamsulosin HCl (FLOMAX) 0.4 MG CAPS Take 0.4 mg by mouth 2 (two) times daily.     . traMADol (ULTRAM) 50 MG tablet Take 2 tablets (100 mg total) by mouth 2 (two) times daily as needed for moderate pain. 60 tablet 3  . aspirin 81 MG chewable tablet Chew 81 mg by mouth daily.    . mesalamine (CANASA) 1000 MG suppository Place 1 suppository (1,000 mg total) rectally at bedtime. (Patient not taking: Reported on 06/06/2018) 30 suppository 3   No facility-administered medications prior to visit.     PAST MEDICAL HISTORY: Past Medical History:  Diagnosis Date  . Anemia   . Arthritis   . AVM (arteriovenous malformation)   . Barrett's esophagus   . BPH (benign prostatic hyperplasia)   . CAD (coronary artery disease)    only on stress test; never had MI, PCI or CABG.  In 2008, LVEF was reportedly normal. pt. denies  . Cataract   . Constipation   . CPAP (continuous positive airway pressure) dependence   . Depression   . Diverticulosis    diverticular bleeding  . Diverticulosis   . GERD (gastroesophageal reflux disease)   . History of radiation therapy   . Hyperlipidemia   . Hypoglycemia   . Lymphocytosis 06/2012  . Mastodynia   . Murmur    slight  . Nasal congestion   .  Osteoporosis   . Osteoporosis   . Prostate cancer ([place])   . Sleep apnea    wears CPAP  . Urinary retention with incomplete bladder emptying    pt uses bathroom and within 30 mins needs to go again  . Vitamin D deficiency     PAST SURGICAL HISTORY: Past Surgical History:  Procedure Laterality Date  . AMPUTATION TOE Right 09/20/2017   Procedure: Right 4th toe amputation;  Surgeon: Wylene Simmer, MD;  Location: [facility];  Service:  Orthopedics;  Laterality: Right;  foot block  . BACK SURGERY  04/2013  . back surgury     f  . BLEPHAROPLASTY Bilateral 02/23/15  . CATARACT EXTRACTION    . COLONOSCOPY    . COLONOSCOPY N/A 10/02/2017   Procedure: COLONOSCOPY;  Surgeon: Doran Stabler, MD;  Location: [facility];  Service: Gastroenterology;  Laterality: N/A;  . COLONOSCOPY WITH PROPOFOL N/A 10/01/2017   Procedure: COLONOSCOPY WITH PROPOFOL;  Surgeon: Doran Stabler, MD;  Location: [facility];  Service: Gastroenterology;  Laterality: N/A;  . COLONOSCOPY WITH PROPOFOL N/A 03/07/2018   Procedure: COLONOSCOPY WITH PROPOFOL;  Surgeon: [name], MD;  Location: [facility];  Service: Endoscopy;  Laterality: N/A;  . ESOPHAGOGASTRODUODENOSCOPY (EGD) WITH PROPOFOL N/A 10/01/2017   Procedure: ESOPHAGOGASTRODUODENOSCOPY (EGD) WITH PROPOFOL;  Surgeon: Doran Stabler, MD;  Location: [facility];  Service: Gastroenterology;  Laterality: N/A;  . EYE SURGERY Bilateral   . FINGER SURGERY Right    right thumb  . HARDWARE REMOVAL Right 05/15/2016   Procedure: Right Lumbar Two-Lumbar Five Removal of Hardware;  Surgeon: Kristeen Miss, MD;  Location: [facility];  Service: Neurosurgery;  Laterality: Right;  Right L2-5 removal of hardware  . INGUINAL HERNIA REPAIR     x4  . REPAIR SPIGELIAN HERNIA  2011  . [place] ,2014  . TONSILLECTOMY    . UPPER GASTROINTESTINAL ENDOSCOPY    . VASECTOMY      FAMILY HISTORY: Family History  Problem Relation Age of Onset    . Prostate cancer Father   . CAD Mother   . Arthritis-Osteo Brother   . Colon cancer Neg Hx   . Stomach cancer Neg Hx     SOCIAL HISTORY: Social History   Socioeconomic History  . Marital status: Married    Spouse name: [name]  . Number of children: 3  . Years of education: College  . Highest education level: Not on file  Occupational History  . 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 Financial resource strain: Not on file  . Food insecurity:    Worry: Not on file    Inability: Not on file  . Transportation needs:    Medical: Not on file    Non-medical: Not on file  Tobacco Use  . Smoking status: Never Smoker  . Smokeless tobacco: Never Used  Substance and Sexual Activity  . Alcohol use: No  . Drug use: No  . Sexual activity: Never  Lifestyle  . Physical activity:    Days per week: Not on file    Minutes per session: Not on file  . Stress: Not on file  Relationships  . Social connections:    Talks on phone: Not on file    Gets together: Not on file    Attends religious service: Not on file    Active member of club or organization: Not on file    Attends meetings of clubs or organizations: Not on file    Relationship status: Not on file  . Intimate partner violence:    Fear of current or ex partner: Not on file    Emotionally abused: Not on file    Physically abused: Not on file    Forced sexual activity: Not on file  Other Topics Concern  . Not on file  Social History Narrative   Patient is married [name]).   Patient is a retired Arboriculturist.  Patient lives in [place] living facility.   Patient has three adult children.   Patient does not drink any caffeine.   Patient is right-handed.      PHYSICAL EXAM  Vitals:   06/12/18 1433  BP: 138/66  Pulse: 66  Weight: 170 lb (77.1 kg)  Height: 5\' 6"  (1.676 m)   Body mass index is 27.44 kg/m.  Generalized: Well developed, in no acute distress    Neurological examination  Mentation: Alert oriented to time, place, history taking. Follows all commands speech and language fluent Cranial nerve II-XII: Pupils were equal round reactive to light. Extraocular movements were full, visual field were full on confrontational test. Facial sensation and strength were normal. Uvula tongue midline. Head turning and shoulder shrug  were normal and symmetric. Motor: The motor testing reveals 5 over 5 strength of all 4 extremities. Good symmetric motor tone is noted throughout.  Sensory: Sensory testing is intact to soft touch on all 4 extremities. No evidence of extinction is noted.  Coordination: Cerebellar testing reveals good finger-nose-finger and heel-to-shin bilaterally.  Gait and station: Patient uses a walker when ambulating.  He has a stooped posture.  Tandem gait not attempted.  He returns today for an evaluation. Reflexes: Deep tendon reflexes are symmetric and normal bilaterally.   DIAGNOSTIC DATA (LABS, IMAGING, TESTING) - I reviewed patient records, labs, notes, testing and imaging myself where available.  Lab Results  Component Value Date   WBC 6.9 05/30/2018   HGB 11.9 (A) 05/30/2018   HCT 36 (A) 05/30/2018   MCV 87.7 05/17/2018   PLT 312 05/30/2018      Component Value Date/Time   NA 138 05/30/2018   K 4.3 05/30/2018   CL 104 05/30/2018   CO2 26 05/30/2018   GLUCOSE 93 10/03/2017 2130   BUN 22 (A) 05/30/2018   CREATININE 0.8 05/30/2018   CREATININE 0.84 10/03/2017 2130   CALCIUM 8.6 05/30/2018   PROT 6.2 05/30/2018   ALBUMIN 3.3 05/30/2018   AST 32 05/30/2018   ALT 16 05/30/2018   ALKPHOS 112 05/30/2018   BILITOT 0.7 10/01/2017 0525   GFRNONAA 83 05/30/2018   GFRAA >60 10/03/2017 2130   Lab Results  Component Value Date   CHOL 141 05/30/2018   HDL 34 (A) 05/30/2018   LDLCALC 88 05/30/2018   TRIG 91 05/30/2018      ASSESSMENT AND PLAN 84 y.o. year old male  has a past medical history of Anemia, Arthritis,  AVM (arteriovenous malformation), Barrett's esophagus, BPH (benign prostatic hyperplasia), CAD (coronary artery disease), Cataract, Constipation, CPAP (continuous positive airway pressure) dependence, Depression, Diverticulosis, Diverticulosis, GERD (gastroesophageal reflux disease), History of radiation therapy, Hyperlipidemia, Hypoglycemia, Lymphocytosis (06/2012), Mastodynia, Murmur, Nasal congestion, Osteoporosis, Osteoporosis, Prostate cancer ([place]), Sleep apnea, Urinary retention with incomplete bladder emptying, and Vitamin D deficiency. here with :  1.  Obstructive sleep apnea on CPAP 2.  Peripheral neuropathy  The patient will continue on gabapentin.  His download indicates good compliance however he does have a high residual apnea.  I will increase his pressure to 8 to 16 cm of water.  He is advised that if his symptoms worsen or he develops new symptoms he should let us know.  He will follow-up in 6 months or sooner if needed.    Ward Givens, MSN, NP-C 06/12/2018, 3:03 PM [facility] [address], [place], [place] [address] [phone]

## 2018-06-12 NOTE — Patient Instructions (Signed)
Your Plan:  Continue CPAP  I will adjust pressure Continue Gabapentin If your symptoms worsen or you develop new symptoms please let us know.   Thank you for coming to see [place] at [facility]. I hope we have been able to provide you high quality care today.  You may receive a patient satisfaction survey over the next few weeks. We would appreciate your feedback and comments so that we may continue to improve ourselves and the health of our patients.

## 2018-06-13 DIAGNOSIS — M5441 Lumbago with sciatica, right side: Secondary | ICD-10-CM | POA: Diagnosis not present

## 2018-06-13 DIAGNOSIS — R2681 Unsteadiness on feet: Secondary | ICD-10-CM | POA: Diagnosis not present

## 2018-06-13 DIAGNOSIS — M5442 Lumbago with sciatica, left side: Secondary | ICD-10-CM | POA: Diagnosis not present

## 2018-06-13 DIAGNOSIS — M545 Low back pain: Secondary | ICD-10-CM | POA: Diagnosis not present

## 2018-06-13 NOTE — Progress Notes (Signed)
CPAP order, re: change pressure successfully faxed to [facility].

## 2018-06-18 DIAGNOSIS — R2681 Unsteadiness on feet: Secondary | ICD-10-CM | POA: Diagnosis not present

## 2018-06-18 DIAGNOSIS — M545 Low back pain: Secondary | ICD-10-CM | POA: Diagnosis not present

## 2018-06-18 DIAGNOSIS — M5442 Lumbago with sciatica, left side: Secondary | ICD-10-CM | POA: Diagnosis not present

## 2018-06-18 DIAGNOSIS — M5441 Lumbago with sciatica, right side: Secondary | ICD-10-CM | POA: Diagnosis not present

## 2018-06-20 DIAGNOSIS — R2681 Unsteadiness on feet: Secondary | ICD-10-CM | POA: Diagnosis not present

## 2018-06-20 DIAGNOSIS — M5441 Lumbago with sciatica, right side: Secondary | ICD-10-CM | POA: Diagnosis not present

## 2018-06-20 DIAGNOSIS — C61 Malignant neoplasm of prostate: Secondary | ICD-10-CM | POA: Diagnosis not present

## 2018-06-20 DIAGNOSIS — G4733 Obstructive sleep apnea (adult) (pediatric): Secondary | ICD-10-CM | POA: Diagnosis not present

## 2018-06-20 DIAGNOSIS — Z6826 Body mass index (BMI) 26.0-26.9, adult: Secondary | ICD-10-CM | POA: Diagnosis not present

## 2018-06-20 DIAGNOSIS — R7309 Other abnormal glucose: Secondary | ICD-10-CM | POA: Diagnosis not present

## 2018-06-20 DIAGNOSIS — M545 Low back pain: Secondary | ICD-10-CM | POA: Diagnosis not present

## 2018-06-20 DIAGNOSIS — M5442 Lumbago with sciatica, left side: Secondary | ICD-10-CM | POA: Diagnosis not present

## 2018-06-26 DIAGNOSIS — R2681 Unsteadiness on feet: Secondary | ICD-10-CM | POA: Diagnosis not present

## 2018-06-26 DIAGNOSIS — M5442 Lumbago with sciatica, left side: Secondary | ICD-10-CM | POA: Diagnosis not present

## 2018-06-26 DIAGNOSIS — M545 Low back pain: Secondary | ICD-10-CM | POA: Diagnosis not present

## 2018-06-26 DIAGNOSIS — M5441 Lumbago with sciatica, right side: Secondary | ICD-10-CM | POA: Diagnosis not present

## 2018-06-28 DIAGNOSIS — N401 Enlarged prostate with lower urinary tract symptoms: Secondary | ICD-10-CM | POA: Diagnosis not present

## 2018-06-28 DIAGNOSIS — R3914 Feeling of incomplete bladder emptying: Secondary | ICD-10-CM | POA: Diagnosis not present

## 2018-07-03 ENCOUNTER — Emergency Department (HOSPITAL_COMMUNITY)
Admission: EM | Admit: 2018-07-03 | Discharge: 2018-07-03 | Disposition: A | Discharge status: Skilled Nursing Facility | Payer: Medicare Other | Attending: Emergency Medicine | Admitting: Emergency Medicine

## 2018-07-03 ENCOUNTER — Other Ambulatory Visit: Payer: Self-pay

## 2018-07-03 ENCOUNTER — Encounter (HOSPITAL_COMMUNITY): Payer: Self-pay | Admitting: Emergency Medicine

## 2018-07-03 ENCOUNTER — Emergency Department (HOSPITAL_COMMUNITY): Payer: Medicare Other

## 2018-07-03 DIAGNOSIS — Y929 Unspecified place or not applicable: Secondary | ICD-10-CM | POA: Diagnosis not present

## 2018-07-03 DIAGNOSIS — I259 Chronic ischemic heart disease, unspecified: Secondary | ICD-10-CM | POA: Diagnosis not present

## 2018-07-03 DIAGNOSIS — I10 Essential (primary) hypertension: Secondary | ICD-10-CM | POA: Diagnosis not present

## 2018-07-03 DIAGNOSIS — Y939 Activity, unspecified: Secondary | ICD-10-CM | POA: Insufficient documentation

## 2018-07-03 DIAGNOSIS — Z7401 Bed confinement status: Secondary | ICD-10-CM | POA: Diagnosis not present

## 2018-07-03 DIAGNOSIS — W19XXXA Unspecified fall, initial encounter: Secondary | ICD-10-CM | POA: Insufficient documentation

## 2018-07-03 DIAGNOSIS — M47816 Spondylosis without myelopathy or radiculopathy, lumbar region: Secondary | ICD-10-CM | POA: Diagnosis not present

## 2018-07-03 DIAGNOSIS — M545 Low back pain, unspecified: Secondary | ICD-10-CM

## 2018-07-03 DIAGNOSIS — R52 Pain, unspecified: Secondary | ICD-10-CM | POA: Diagnosis not present

## 2018-07-03 DIAGNOSIS — Z79899 Other long term (current) drug therapy: Secondary | ICD-10-CM | POA: Diagnosis not present

## 2018-07-03 DIAGNOSIS — Y999 Unspecified external cause status: Secondary | ICD-10-CM | POA: Insufficient documentation

## 2018-07-03 DIAGNOSIS — S3210XA Unspecified fracture of sacrum, initial encounter for closed fracture: Secondary | ICD-10-CM | POA: Insufficient documentation

## 2018-07-03 DIAGNOSIS — M2550 Pain in unspecified joint: Secondary | ICD-10-CM | POA: Diagnosis not present

## 2018-07-03 DIAGNOSIS — M5489 Other dorsalgia: Secondary | ICD-10-CM | POA: Diagnosis not present

## 2018-07-03 MED ORDER — HYDROMORPHONE HCL 1 MG/ML IJ SOLN
0.5000 mg | Freq: Once | INTRAMUSCULAR | Status: AC
  Administered 2018-07-03: 0.5 mg via INTRAMUSCULAR
  Filled 2018-07-03: qty 1

## 2018-07-03 MED ORDER — OXYCODONE HCL 5 MG PO TABS: 5.0000 mg | ORAL_TABLET | Freq: Four times a day (QID) | ORAL | 0 refills | Status: DC | PRN

## 2018-07-03 NOTE — Progress Notes (Signed)
Patient from [facility]. Patient has been accepted to [facility]. Patient, RN and EDP agreeable to discharge plan. CSW updated patient's wife and patients sister-in-law regarding discharge plans. Please call report to [phone]. [facility] has been called for transportation.    Ollen Barges, [place] Work Department  [employer]  [phone]

## 2018-07-03 NOTE — Discharge Instructions (Signed)
Please return for any problem.  Follow-up with your regular care provider as instructed. °

## 2018-07-03 NOTE — Progress Notes (Signed)
  PT Cancellation Note  Patient Details Name: Charles [name] MRN: [id number] DOB: 11-06-1931   Cancelled Treatment:    Reason Eval/Treat Not Completed: Patient not medically ready. Per Neurosurgery note, patient to be bedrest  And NWB until IR evaluates for  sacroplasty eligibility.   Claretha Cooper 07/03/2018, 2:05 PM  Tresa Endo PT [phone]

## 2018-07-03 NOTE — ED Notes (Signed)
Phone report called to Izora Gala, Therapist, sports at [facility].

## 2018-07-03 NOTE — ED Provider Notes (Signed)
Charles [name] Provider Note   CSN: [id number] Arrival date & time: 07/03/18  8250     History   Chief Complaint Chief Complaint  Patient presents with  . Back Pain    HPI Charles [name] is a 84 y.o. male.  84 year old male with prior history as detailed below presents with complaint of acute on chronic back pain.  Patient with long-standing history of back pain.  He reports a fall at the end of June for which she was seen in the ED.  He reports negative plain films of his low back.  After that visit he was placed into a SNF for approximately 2 weeks.  Thereafter he was back to his assisted living apartment with his wife.  Over the last 3 days he has had increasing back pain.  He has been unable to ambulate for the last 2 days secondary to pain.  He denies any numbness or weakness to his lower extremities.  He complains of increasing low back pain.  This has not been controlled with gabapentin and Norco.  Chart review shows an admit in May of 2014 for scoliosis/ kyphoscoliosis and he underwent a Right TLIF L2-3, L3-4 and L4-5, and Removal of hardware L5-S1, and Segmental fixation L2 to S1, Interbody cages x3, local bone graft. Repair of dural tear of L4-5 right on 04/08/2013.  In June 2017 he was admitted for chronic lumbar radiculopathy, and then underwent decompression and fusion L2-5.  The history is provided by the patient, medical records and the spouse.  Back Pain   This is a recurrent problem. The current episode started more than 1 week ago. The problem occurs daily. The problem has not changed since onset.The pain is associated with no known injury. The pain is present in the lumbar spine. The quality of the pain is described as aching. The pain does not radiate. The pain is moderate. The symptoms are aggravated by bending. Pertinent negatives include no chest pain, no fever, no abdominal swelling, no bowel incontinence, no bladder incontinence, no  paresthesias, no paresis, no tingling and no weakness.    Past Medical History:  Diagnosis Date  . Anemia   . Arthritis   . AVM (arteriovenous malformation)   . Barrett's esophagus   . BPH (benign prostatic hyperplasia)   . CAD (coronary artery disease)    only on stress test; never had MI, PCI or CABG.  In 2008, LVEF was reportedly normal. pt. denies  . Cataract   . Constipation   . CPAP (continuous positive airway pressure) dependence   . Depression   . Diverticulosis    diverticular bleeding  . Diverticulosis   . GERD (gastroesophageal reflux disease)   . History of radiation therapy   . Hyperlipidemia   . Hypoglycemia   . Lymphocytosis 06/2012  . Mastodynia   . Murmur    slight  . Nasal congestion   . Osteoporosis   . Osteoporosis   . Prostate cancer ([place])   . Sleep apnea    wears CPAP  . Urinary retention with incomplete bladder emptying    pt uses bathroom and within 30 mins needs to go again  . Vitamin D deficiency     Patient Active Problem List   Diagnosis Date Noted  . CAD (coronary artery disease) 05/29/2018  . AVM (arteriovenous malformation) of colon   . Radiation proctitis   . Toe amputation status, right ([place]) 03/01/2018  . Diverticulosis of colon with hemorrhage   .  Acute blood loss anemia   . History of back surgery 10/03/2017  . Rectal bleeding 10/03/2017  . Hematochezia   . Lower GI bleeding 09/29/2017  . Treatment-emergent central sleep apnea 06/25/2017  . Chronic bilateral low back pain with bilateral sciatica 01/17/2017  . Post laminectomy syndrome 01/17/2017  . Chronic pain syndrome 01/17/2017  . Sacroiliitis ([place]) 01/17/2017  . Piriformis syndrome of right side 01/17/2017  . Malignant neoplasm of prostate ([place]) 07/19/2016  . Fixation hardware in spine 05/15/2016  . Hypoglycemic reaction 02/02/2015  . Tremor, essential 12/11/2014  . OSA on CPAP 04/24/2014  . 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 Hypersomnia with sleep apnea,  unspecified 04/24/2014  . Depression 04/18/2013  . BPH (benign prostatic hyperplasia) 04/18/2013  . Impingement syndrome of right shoulder 04/14/2013    Class: Acute  . Scoliosis or kyphoscoliosis 04/08/2013    Class: Chronic  . Lumbosacral spondylosis without myelopathy 04/08/2013    Class: Chronic  . Acute respiratory failure ([place]) 04/08/2013  . Essential hypertension 04/08/2013  . Lymphocytosis   . Diaphragmatic hernia 11/21/2010  . DIVERTICULAR DISEASE 11/21/2010  . BARRETT'S ESOPHAGUS 02/22/2010  . GERD 07/20/2008  . INGUINAL HERNIA, LEFT, SMALL 07/20/2008  . OTH VENTRAL HERN W/O MENTION OBSTRUCTION/GANGREN 07/20/2008  . CONSTIPATION 07/20/2008  . DYSPHAGIA 07/20/2008    Past Surgical History:  Procedure Laterality Date  . AMPUTATION TOE Right 09/20/2017   Procedure: Right 4th toe amputation;  Surgeon: Wylene Simmer, MD;  Location: [facility];  Service: Orthopedics;  Laterality: Right;  foot block  . BACK SURGERY  04/2013  . back surgury     f  . BLEPHAROPLASTY Bilateral 02/23/15  . CATARACT EXTRACTION    . COLONOSCOPY    . COLONOSCOPY N/A 10/02/2017   Procedure: COLONOSCOPY;  Surgeon: Doran Stabler, MD;  Location: [facility];  Service: Gastroenterology;  Laterality: N/A;  . COLONOSCOPY WITH PROPOFOL N/A 10/01/2017   Procedure: COLONOSCOPY WITH PROPOFOL;  Surgeon: Doran Stabler, MD;  Location: [facility];  Service: Gastroenterology;  Laterality: N/A;  . COLONOSCOPY WITH PROPOFOL N/A 03/07/2018   Procedure: COLONOSCOPY WITH PROPOFOL;  Surgeon: [name], MD;  Location: [facility];  Service: Endoscopy;  Laterality: N/A;  . ESOPHAGOGASTRODUODENOSCOPY (EGD) WITH PROPOFOL N/A 10/01/2017   Procedure: ESOPHAGOGASTRODUODENOSCOPY (EGD) WITH PROPOFOL;  Surgeon: Doran Stabler, MD;  Location: [facility];  Service: Gastroenterology;  Laterality: N/A;  . EYE SURGERY Bilateral   . FINGER SURGERY Right    right thumb  . HARDWARE REMOVAL Right  05/15/2016   Procedure: Right Lumbar Two-Lumbar Five Removal of Hardware;  Surgeon: Kristeen Miss, MD;  Location: [facility];  Service: Neurosurgery;  Laterality: Right;  Right L2-5 removal of hardware  . INGUINAL HERNIA REPAIR     x4  . REPAIR SPIGELIAN HERNIA  2011  . [place] ,2014  . TONSILLECTOMY    . UPPER GASTROINTESTINAL ENDOSCOPY    . VASECTOMY          Home Medications    Prior to Admission medications   Medication Sig Start Date End Date Taking? Authorizing Provider  acetaminophen (TYLENOL) 500 MG tablet Take 1,000 mg by mouth every 6 (six) hours as needed for mild pain. TAKES WITH TRAMADOL    [provider]  buPROPion (WELLBUTRIN SR) 150 MG 12 hr tablet Take 150 mg by mouth 2 (two) times daily.  06/28/11   [provider]  carboxymethylcellul-glycerin [facility] REPAIR) 0.5-0.9 % ophthalmic solution Place  1 drop into both eyes 3 (three) times daily.    [provider]  cholecalciferol (VITAMIN D) 1000 UNITS tablet Take 1,000 Units by mouth daily.    [provider]  docusate sodium (COLACE) 100 MG capsule Take 100 mg by mouth as needed for mild constipation.     [provider]  ferrous sulfate 325 (65 FE) MG EC tablet Take 325 mg by mouth daily with breakfast.    [provider]  gabapentin (NEURONTIN) 300 MG capsule Take 1 tablet PO at 4 PM  and 1 tablet at 10 PM Patient taking differently: Take 300 mg by mouth. Take 1 tablet PO at 8 am and 2 tablet po at 4 pm and 10 pM 06/20/16   Ward Givens, NP  HYDROcodone-acetaminophen (NORCO/VICODIN) 5-325 MG tablet Take 1-2 tablets by mouth every 4 (four) hours as needed. 05/24/18   Virgel Manifold, MD  ipratropium (ATROVENT) 0.03 % nasal spray Place 2 sprays into both nostrils every 12 (twelve) hours.    [provider]  lovastatin (MEVACOR) 20 MG tablet Take 20 mg by mouth at bedtime.  07/22/12   [provider]  Magnesium 250 MG TABS Take 250 mg by  mouth every evening.     [provider]  magnesium hydroxide (MILK OF MAGNESIA) 400 MG/5ML suspension Take 5 mLs by mouth as needed for mild constipation.     [provider]  methocarbamol (ROBAXIN) 500 MG tablet Take 500 mg by mouth every 8 (eight) hours as needed for muscle spasms.    [provider]  Multiple Minerals-Vitamins (CITRACAL PLUS BONE DENSITY) TABS Take 1 tablet by mouth 2 (two) times daily.     [provider]  Multiple Vitamins-Minerals (CENTRUM ADULTS PO) Take 1 tablet by mouth daily.    [provider]  Multiple Vitamins-Minerals (PRESERVISION AREDS 2 PO) Take 1 capsule by mouth 2 (two) times daily.    [provider]  omeprazole (PRILOSEC) 40 MG capsule TAKE 1 CAPSULE(40 MG) BY MOUTH TWICE DAILY 02/25/18   [name], MD  propranolol (INDERAL) 10 MG tablet Take 10 mg by mouth daily.     [provider]  ranitidine (ZANTAC) 300 MG tablet TAKE 1 TABLET(300 MG) BY MOUTH AT BEDTIME 02/25/18   [name], MD  rOPINIRole (REQUIP) 2 MG tablet Take 2 mg by mouth at bedtime.    [provider]  senna-docusate (SENOKOT-S) 8.6-50 MG tablet Take 1 tablet by mouth at bedtime as needed for mild constipation.    [provider]  Tamsulosin HCl (FLOMAX) 0.4 MG CAPS Take 0.4 mg by mouth 2 (two) times daily.  08/30/11   [provider]  traMADol (ULTRAM) 50 MG tablet Take 2 tablets (100 mg total) by mouth 2 (two) times daily as needed for moderate pain. 05/15/16   Kristeen Miss, MD    Family History Family History  Problem Relation Age of Onset  . Prostate cancer Father   . CAD Mother   . Arthritis-Osteo Brother   . Colon cancer Neg Hx   . Stomach cancer Neg Hx     Social History Social History   Tobacco Use  . Smoking status: Never Smoker  . Smokeless tobacco: Never Used  Substance Use Topics  . Alcohol use: No  . Drug use: No     Allergies   Celebrex [celecoxib] and Flexeril  [cyclobenzaprine]   Review of Systems Review of Systems  Constitutional: Negative for fever.  Cardiovascular: Negative for  chest pain.  Gastrointestinal: Negative for bowel incontinence.  Genitourinary: Negative for bladder incontinence.  Musculoskeletal: Positive for back pain.  Neurological: Negative for tingling, weakness and paresthesias.  All other systems reviewed and are negative.    Physical Exam Updated Vital Signs BP (!) 146/96 (BP Location: Right Arm)   Pulse 69   Temp 97.6 F (36.4 C) (Oral)   Resp 18   SpO2 99%   Physical Exam  Constitutional: He is oriented to person, place, and time. He appears well-developed and well-nourished. No distress.  HENT:  Head: Normocephalic and atraumatic.  Mouth/Throat: Oropharynx is clear and moist.  Eyes: Pupils are equal, round, and reactive to light. Conjunctivae and EOM are normal.  Neck: Normal range of motion. Neck supple.  Cardiovascular: Normal rate, regular rhythm and normal heart sounds.  Pulmonary/Chest: Effort normal and breath sounds normal. No respiratory distress.  Abdominal: Soft. He exhibits no distension. There is no tenderness.  Musculoskeletal: Normal range of motion. He exhibits no edema or deformity.  Neurological: He is alert and oriented to person, place, and time. He displays normal reflexes. No sensory deficit. He exhibits normal muscle tone.  AOx4 Normal speech No facial droop 5/5 strength BUE/BLE DTR's intact in BLE BLE NVI   Skin: Skin is warm and dry.  Psychiatric: He has a normal mood and affect.  Nursing note and vitals reviewed.    ED Treatments / Results  Labs (all labs ordered are listed, but only abnormal results are displayed) Labs Reviewed - No data to display  EKG None  Radiology Ct Lumbar Spine Wo Contrast  Result Date: 07/03/2018 CLINICAL DATA:  Fall 6 weeks ago with negative x-rays. Worsening for 2 days EXAM: CT LUMBAR SPINE WITHOUT CONTRAST TECHNIQUE: Multidetector CT  imaging of the lumbar spine was performed without intravenous contrast administration. Multiplanar CT image reconstructions were also generated. COMPARISON:  01/10/2017 FINDINGS: Segmentation: Transitional S1 vertebra with lumbarization. The same numbering scheme was used on prior. Alignment: Fused anterolisthesis at L5-S1. Reversal of lordosis in the upper lumbar spine. Vertebrae: Bilateral vertical sacral ala fractures extending to the anterior sacral foramina without visible narrowing. There is also transverse component at the S3 level. This shows patchy heterogeneous sclerosis compatible with subacute timing. Related likely subacute fracture through right posterior-lateral arthrodesis at L5 and S1, lateral to the pedicles and at the level of transverse processes. Endplate erosions at J6-2, with sclerosis, is chronic and degenerative when compared to prior. Paraspinal and other soft tissues: Postoperative scarring dorsally. There is presacral edema related to fractures. Hiatal hernia. Disc levels: T12- L1: Unremarkable. L1-L2: Severe disc disease with endplate degeneration, disc collapse, and vacuum phenomenon. Bilateral foraminal narrowing. L2-L3: PLIF with solid arthrodesis. L3-L4: PLIF with solid arthrodesis.  Broad laminectomy. L4-L5: PLIF with solid arthrodesis.  Broad laminectomy. L5-S1:PLIF with fused anterolisthesis. There is right more than left foraminal narrowing from disc height loss. IMPRESSION: 1. Vertical and transverse sacral insufficiency fractures that are likely subacute. 2. Fracture through L5-S1 right posterior-lateral arthrodesis at the level of the transverse processes. 3. L2-S1 fusion. 4. Severe L1-2 adjacent segment disc degeneration. 5. Transitional S1 vertebra. Electronically Signed   By: Monte Fantasia M.D.   On: 07/03/2018 11:19    Procedures Procedures (including critical care time)  Medications Ordered in ED Medications  HYDROmorphone (DILAUDID) injection 0.5 mg (0.5 mg  Intramuscular Given 07/03/18 1056)     Initial Impression / Assessment and Plan / ED Course  I have reviewed the triage vital signs and the nursing  notes.  Pertinent labs & imaging results that were available during my care of the patient were reviewed by me and considered in my medical decision making (see chart for details).     MDM  Screen complete  Patient is presenting for acute on chronic low back pain.  CT imaging revealed subacute fractures.  These fractures most likely occurred with the patient's reported fall in June.  He feels improved following IM Dilaudid.  Patient is followed by Dr. Ellene Route of [facility] neurosurgery and spine.  Case discussed with neurosurgery and they do not feel that the patient requires acute intervention at this time.  Treatment for his fractures will primarily consist of pain control, bedrest, and physical therapy.  Arrangements are made for SNF placement. Patient and family are in agreement with plan of care.   Importance of close follow up stressed. Strict return instructions given and understood.   Final Clinical Impressions(s) / ED Diagnoses   Final diagnoses:  Acute low back pain without sciatica, unspecified back pain laterality  Closed fracture of sacrum, unspecified portion of sacrum, initial encounter [facility])    ED Discharge Orders        Ordered    oxyCODONE (ROXICODONE) 5 MG immediate release tablet  Every 6 hours PRN     07/03/18 1414       Valarie Merino, MD 07/03/18 1417

## 2018-07-03 NOTE — Progress Notes (Signed)
APatient ID: Charles [name], male   DOB: 03/05/1931, 84 y.o.   MRN: [id number]   Asked to review CT scan patient comes in with back and tailbone pain.  Patient has chronic degenerative disease with segmental degeneration above his fusion at L1-L2.  In addition appears to have sacral insufficiency fractures which is probably the greatest cause of his discomfort.  There is a question of a fracture for the arthrodesis at L5-S1 on the right however it does appear that he has a solid left-sided arthrodesis and a solid interbody arthrodesis.  So I do not think that that questionable fracture through the arthrodesis at L5-S1 is clinically significant.  There is no operative intervention indicated for his sacral insufficiency fractures.  Consideration certainly can be given to sacral plasty and would recommend referral to interventional radiology for this.  I think it is okay for the patient be transferred to a skilled nursing facility to be medically managed should be nonweightbearing and bedrest until radiology has a chance to review his eligibility for a sacral plasty.  Patient can follow-up with Dr. [name] as an outpatient

## 2018-07-03 NOTE — NC FL2 (Signed)
[place] LEVEL OF CARE SCREENING TOOL     IDENTIFICATION  Patient Name: Charles [name] Birthdate: 12-01-1931 Sex: male Admission Date (Current Location): 07/03/2018  [facility] and [place] Number:  Herbalist and Address:  [facility],  [place] [address], [place]      Provider Number: [id number]  Attending Physician Name and Address:  Valarie Merino, MD  Relative Name and Phone Number:       Current Level of Care: Hospital Recommended Level of Care: [place] Prior Approval Number:    Date Approved/Denied:   PASRR Number: [id number]  Discharge Plan: SNF    Current Diagnoses: Patient Active Problem List   Diagnosis Date Noted  . CAD (coronary artery disease) 05/29/2018  . AVM (arteriovenous malformation) of colon   . Radiation proctitis   . Toe amputation status, right ([place]) 03/01/2018  . Diverticulosis of colon with hemorrhage   . Acute blood loss anemia   . History of back surgery 10/03/2017  . Rectal bleeding 10/03/2017  . Hematochezia   . Lower GI bleeding 09/29/2017  . Treatment-emergent central sleep apnea 06/25/2017  . Chronic bilateral low back pain with bilateral sciatica 01/17/2017  . Post laminectomy syndrome 01/17/2017  . Chronic pain syndrome 01/17/2017  . Sacroiliitis ([place]) 01/17/2017  . Piriformis syndrome of right side 01/17/2017  . Malignant neoplasm of prostate ([place]) 07/19/2016  . Fixation hardware in spine 05/15/2016  . Hypoglycemic reaction 02/02/2015  . Tremor, essential 12/11/2014  . OSA on CPAP 04/24/2014  . 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 Hypersomnia with sleep apnea, unspecified 04/24/2014  . Depression 04/18/2013  . BPH (benign prostatic hyperplasia) 04/18/2013  . Impingement syndrome of right shoulder 04/14/2013    Class: Acute  . Scoliosis or kyphoscoliosis 04/08/2013    Class: Chronic  . Lumbosacral spondylosis without myelopathy 04/08/2013    Class:  Chronic  . Acute respiratory failure ([place]) 04/08/2013  . Essential hypertension 04/08/2013  . Lymphocytosis   . Diaphragmatic hernia 11/21/2010  . DIVERTICULAR DISEASE 11/21/2010  . BARRETT'S ESOPHAGUS 02/22/2010  . GERD 07/20/2008  . INGUINAL HERNIA, LEFT, SMALL 07/20/2008  . OTH VENTRAL HERN W/O MENTION OBSTRUCTION/GANGREN 07/20/2008  . CONSTIPATION 07/20/2008  . DYSPHAGIA 07/20/2008    Orientation RESPIRATION BLADDER Height & Weight     Self, Time, Situation, Place  Normal Indwelling catheter Weight:   Height:     BEHAVIORAL SYMPTOMS/MOOD NEUROLOGICAL BOWEL NUTRITION STATUS      Continent Diet(Regular)  AMBULATORY STATUS COMMUNICATION OF NEEDS Skin   Extensive Assist Verbally Normal                       Personal Care Assistance Level of Assistance  Bathing, Feeding, Dressing Bathing Assistance: Maximum assistance Feeding assistance: Independent Dressing Assistance: Limited assistance     Functional Limitations Info             SPECIAL CARE FACTORS FREQUENCY  PT (By licensed PT), OT (By licensed OT)     PT Frequency: Eval and treat              Contractures      Additional Factors Info                  Current Medications (07/03/2018):  This is the current hospital active medication list No current facility-administered medications for this encounter.    Current Outpatient Medications  Medication Sig Dispense Refill  . buPROPion [facility] SR)  150 MG 12 hr tablet Take 150 mg by mouth 2 (two) times daily.     . carboxymethylcellul-glycerin (REFRESH REPAIR) 0.5-0.9 % ophthalmic solution Place 1 drop into both eyes 3 (three) times daily.    . cholecalciferol (VITAMIN D) 1000 UNITS tablet Take 1,000 Units by mouth daily.    Marland Kitchen docusate sodium (COLACE) 100 MG capsule Take 100 mg by mouth daily as needed for mild constipation.     . ferrous sulfate 325 (65 FE) MG EC tablet Take 325 mg by mouth daily with breakfast.    . gabapentin (NEURONTIN) 300  MG capsule Take 1 tablet PO at 4 PM  and 1 tablet at 10 PM (Patient taking differently: Take 300 mg by mouth. Take 1 capsule PO at 8 am, 1 capsule at lunch (12pm) and 2 capsules at 5 pm, and 2 capsules 10 pm) 180 capsule 3  . HYDROcodone-acetaminophen (NORCO/VICODIN) 5-325 MG tablet Take 1-2 tablets by mouth every 4 (four) hours as needed. (Patient taking differently: Take 1 tablet by mouth every 6 (six) hours as needed for moderate pain. ) 20 tablet 0  . ipratropium (ATROVENT) 0.03 % nasal spray Place 2 sprays into both nostrils 2 (two) times daily.     Marland Kitchen lovastatin (MEVACOR) 20 MG tablet Take 20 mg by mouth at bedtime.     . Magnesium 250 MG TABS Take 250 mg by mouth daily.     . methocarbamol (ROBAXIN) 500 MG tablet Take 500 mg by mouth every 6 (six) hours as needed for muscle spasms.     . Multiple Minerals-Vitamins (CITRACAL PLUS BONE DENSITY) TABS Take 1 tablet by mouth 2 (two) times daily.     . Multiple Vitamins-Minerals (CENTRUM ADULTS PO) Take 1 tablet by mouth daily.    . Multiple Vitamins-Minerals (PRESERVISION AREDS 2 PO) Take 1 capsule by mouth 2 (two) times daily.    Marland Kitchen omeprazole (PRILOSEC) 40 MG capsule TAKE 1 CAPSULE(40 MG) BY MOUTH TWICE DAILY 180 capsule 1  . Polyethylene Glycol 3350 (MIRALAX PO) Take 1 Scoop by mouth daily as needed (constipation).    . propranolol (INDERAL) 10 MG tablet Take 10 mg by mouth daily.     . ranitidine (ZANTAC) 300 MG tablet TAKE 1 TABLET(300 MG) BY MOUTH AT BEDTIME 90 tablet 1  . rOPINIRole (REQUIP) 2 MG tablet Take 4 mg by mouth at bedtime.     . Tamsulosin HCl (FLOMAX) 0.4 MG CAPS Take 0.4 mg by mouth 2 (two) times daily.     . traMADol (ULTRAM) 50 MG tablet Take 2 tablets (100 mg total) by mouth 2 (two) times daily as needed for moderate pain. (Patient taking differently: Take 100 mg by mouth every 6 (six) hours as needed for moderate pain. ) 60 tablet 3  . oxyCODONE (ROXICODONE) 5 MG immediate release tablet Take 1 tablet (5 mg total) by mouth  every 6 (six) hours as needed for severe pain. 30 tablet 0     Discharge Medications: Please see discharge summary for a list of discharge medications.  Relevant Imaging Results:  Relevant Lab Results:   Additional Information [id number]  Ollen Barges, LCSWA

## 2018-07-03 NOTE — NC FL2 (Signed)
[place] LEVEL OF CARE SCREENING TOOL     IDENTIFICATION  Patient Name: Charles [name] Birthdate: 1931-02-01 Sex: male Admission Date (Current Location): 07/03/2018  [facility] and [place] Number:  Herbalist and Address:  [facility],  [place] [address], [place]      Provider Number: [id number]  Attending Physician Name and Address:  Valarie Merino, MD  Relative Name and Phone Number:       Current Level of Care: Hospital Recommended Level of Care: [place] Prior Approval Number:    Date Approved/Denied:   PASRR Number: [id number]  Discharge Plan: SNF    Current Diagnoses: Patient Active Problem List   Diagnosis Date Noted  . CAD (coronary artery disease) 05/29/2018  . AVM (arteriovenous malformation) of colon   . Radiation proctitis   . Toe amputation status, right ([place]) 03/01/2018  . Diverticulosis of colon with hemorrhage   . Acute blood loss anemia   . History of back surgery 10/03/2017  . Rectal bleeding 10/03/2017  . Hematochezia   . Lower GI bleeding 09/29/2017  . Treatment-emergent central sleep apnea 06/25/2017  . Chronic bilateral low back pain with bilateral sciatica 01/17/2017  . Post laminectomy syndrome 01/17/2017  . Chronic pain syndrome 01/17/2017  . Sacroiliitis ([place]) 01/17/2017  . Piriformis syndrome of right side 01/17/2017  . Malignant neoplasm of prostate ([place]) 07/19/2016  . Fixation hardware in spine 05/15/2016  . Hypoglycemic reaction 02/02/2015  . Tremor, essential 12/11/2014  . OSA on CPAP 04/24/2014  . 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 Hypersomnia with sleep apnea, unspecified 04/24/2014  . Depression 04/18/2013  . BPH (benign prostatic hyperplasia) 04/18/2013  . Impingement syndrome of right shoulder 04/14/2013    Class: Acute  . Scoliosis or kyphoscoliosis 04/08/2013    Class: Chronic  . Lumbosacral spondylosis without myelopathy 04/08/2013    Class:  Chronic  . Acute respiratory failure ([place]) 04/08/2013  . Essential hypertension 04/08/2013  . Lymphocytosis   . Diaphragmatic hernia 11/21/2010  . DIVERTICULAR DISEASE 11/21/2010  . BARRETT'S ESOPHAGUS 02/22/2010  . GERD 07/20/2008  . INGUINAL HERNIA, LEFT, SMALL 07/20/2008  . OTH VENTRAL HERN W/O MENTION OBSTRUCTION/GANGREN 07/20/2008  . CONSTIPATION 07/20/2008  . DYSPHAGIA 07/20/2008    Orientation RESPIRATION BLADDER Height & Weight     Self, Time, Situation, Place  Normal External catheter Weight:   Height:     BEHAVIORAL SYMPTOMS/MOOD NEUROLOGICAL BOWEL NUTRITION STATUS      Continent Diet(Regular)  AMBULATORY STATUS COMMUNICATION OF NEEDS Skin   Extensive Assist Verbally Normal                       Personal Care Assistance Level of Assistance  Bathing, Feeding, Dressing Bathing Assistance: Maximum assistance Feeding assistance: Independent Dressing Assistance: Limited assistance     Functional Limitations Info             SPECIAL CARE FACTORS FREQUENCY  PT (By licensed PT), OT (By licensed OT)     PT Frequency: Eval and treat              Contractures      Additional Factors Info                 Current Medications (07/03/2018):  This is the current hospital active medication list No current facility-administered medications for this encounter.    Current Outpatient Medications  Medication Sig Dispense Refill  . buPROPion [facility] SR) 150  MG 12 hr tablet Take 150 mg by mouth 2 (two) times daily.     . carboxymethylcellul-glycerin (REFRESH REPAIR) 0.5-0.9 % ophthalmic solution Place 1 drop into both eyes 3 (three) times daily.    . cholecalciferol (VITAMIN D) 1000 UNITS tablet Take 1,000 Units by mouth daily.    Marland Kitchen docusate sodium (COLACE) 100 MG capsule Take 100 mg by mouth daily as needed for mild constipation.     . ferrous sulfate 325 (65 FE) MG EC tablet Take 325 mg by mouth daily with breakfast.    . gabapentin (NEURONTIN) 300 MG  capsule Take 1 tablet PO at 4 PM  and 1 tablet at 10 PM (Patient taking differently: Take 300 mg by mouth. Take 1 capsule PO at 8 am, 1 capsule at lunch (12pm) and 2 capsules at 5 pm, and 2 capsules 10 pm) 180 capsule 3  . HYDROcodone-acetaminophen (NORCO/VICODIN) 5-325 MG tablet Take 1-2 tablets by mouth every 4 (four) hours as needed. (Patient taking differently: Take 1 tablet by mouth every 6 (six) hours as needed for moderate pain. ) 20 tablet 0  . ipratropium (ATROVENT) 0.03 % nasal spray Place 2 sprays into both nostrils 2 (two) times daily.     Marland Kitchen lovastatin (MEVACOR) 20 MG tablet Take 20 mg by mouth at bedtime.     . Magnesium 250 MG TABS Take 250 mg by mouth daily.     . methocarbamol (ROBAXIN) 500 MG tablet Take 500 mg by mouth every 6 (six) hours as needed for muscle spasms.     . Multiple Minerals-Vitamins (CITRACAL PLUS BONE DENSITY) TABS Take 1 tablet by mouth 2 (two) times daily.     . Multiple Vitamins-Minerals (CENTRUM ADULTS PO) Take 1 tablet by mouth daily.    . Multiple Vitamins-Minerals (PRESERVISION AREDS 2 PO) Take 1 capsule by mouth 2 (two) times daily.    Marland Kitchen omeprazole (PRILOSEC) 40 MG capsule TAKE 1 CAPSULE(40 MG) BY MOUTH TWICE DAILY 180 capsule 1  . Polyethylene Glycol 3350 (MIRALAX PO) Take 1 Scoop by mouth daily as needed (constipation).    . propranolol (INDERAL) 10 MG tablet Take 10 mg by mouth daily.     . ranitidine (ZANTAC) 300 MG tablet TAKE 1 TABLET(300 MG) BY MOUTH AT BEDTIME 90 tablet 1  . rOPINIRole (REQUIP) 2 MG tablet Take 4 mg by mouth at bedtime.     . Tamsulosin HCl (FLOMAX) 0.4 MG CAPS Take 0.4 mg by mouth 2 (two) times daily.     . traMADol (ULTRAM) 50 MG tablet Take 2 tablets (100 mg total) by mouth 2 (two) times daily as needed for moderate pain. (Patient taking differently: Take 100 mg by mouth every 6 (six) hours as needed for moderate pain. ) 60 tablet 3  . oxyCODONE (ROXICODONE) 5 MG immediate release tablet Take 1 tablet (5 mg total) by mouth every  6 (six) hours as needed for severe pain. 30 tablet 0     Discharge Medications: Please see discharge summary for a list of discharge medications.  Relevant Imaging Results:  Relevant Lab Results:   Additional Information [id number] Patient is non-weight baring.   Ollen Barges, LCSW

## 2018-07-03 NOTE — ED Triage Notes (Signed)
Patient BIB [facility] from [facility] independent living. Pt fell x 6 weeks ago and was seen here. X-ray was negative. Since then pt had loss of bladder control and was seen by urologist. Foley catheter placed and continued at this time. Pt reports pain improved but has gotten excruciatingly worse over the past 2 days and has been immobile x 2 days. Wife has been caring for pt in bed.

## 2018-07-03 NOTE — Progress Notes (Addendum)
1:55pm- CSW spoke with family at bedside who stated that if patient were being discharged they had no other choice but to have patient go to [facility] SNF and pay privately. CSW followed up with Otilio Carpen of [facility] [phone] to see if there was a bed available at their SNF facility. Per Otilio Carpen, there is a bed available for patient upon discharge. CSW waiting for PT evaluation to fax FL2.    CSW spoke with patient, patient's wife and patient's sister in law at bedside. Patient from [place] where he lives with his wife. Patient came in with complaints of back pain. Patient was recently seen in the ED on 6/21 for same thing and was discharged to [facility] SNF. Per patient's wife, patient receives PT in the home through [facility] services. CSW informed family that if they were wanting to place patient in a SNF that due to patient having Medicare Part A and Part B and patient not meeting the 3-night inpatient qualifying stay that family would have to pay privately. Patient's wife expresses concern with the level of care at home and that a SNF would provide. Patient's family not sure what they want to do at this time. CSW will follow up.   Ollen Barges, [place] Work Department  [employer]  [phone]

## 2018-07-03 NOTE — ED Notes (Signed)
Bed: [facility] Expected date:  Expected time:  Means of arrival:  Comments: EMS-fall 

## 2018-07-04 ENCOUNTER — Encounter: Payer: Self-pay | Admitting: Internal Medicine

## 2018-07-04 ENCOUNTER — Non-Acute Institutional Stay (SKILLED_NURSING_FACILITY): Payer: Medicare Other | Admitting: Internal Medicine

## 2018-07-04 DIAGNOSIS — R2681 Unsteadiness on feet: Secondary | ICD-10-CM

## 2018-07-04 DIAGNOSIS — G629 Polyneuropathy, unspecified: Secondary | ICD-10-CM | POA: Diagnosis not present

## 2018-07-04 DIAGNOSIS — R531 Weakness: Secondary | ICD-10-CM

## 2018-07-04 DIAGNOSIS — D638 Anemia in other chronic diseases classified elsewhere: Secondary | ICD-10-CM

## 2018-07-04 DIAGNOSIS — M7541 Impingement syndrome of right shoulder: Secondary | ICD-10-CM

## 2018-07-04 DIAGNOSIS — G4733 Obstructive sleep apnea (adult) (pediatric): Secondary | ICD-10-CM | POA: Diagnosis not present

## 2018-07-04 DIAGNOSIS — K2270 Barrett's esophagus without dysplasia: Secondary | ICD-10-CM | POA: Diagnosis not present

## 2018-07-04 DIAGNOSIS — R339 Retention of urine, unspecified: Secondary | ICD-10-CM | POA: Diagnosis not present

## 2018-07-04 DIAGNOSIS — M8448XA Pathological fracture, other site, initial encounter for fracture: Secondary | ICD-10-CM | POA: Insufficient documentation

## 2018-07-04 DIAGNOSIS — G3184 Mild cognitive impairment, so stated: Secondary | ICD-10-CM

## 2018-07-04 DIAGNOSIS — M5442 Lumbago with sciatica, left side: Secondary | ICD-10-CM

## 2018-07-04 DIAGNOSIS — G8929 Other chronic pain: Secondary | ICD-10-CM

## 2018-07-04 DIAGNOSIS — F329 Major depressive disorder, single episode, unspecified: Secondary | ICD-10-CM | POA: Diagnosis not present

## 2018-07-04 DIAGNOSIS — M5441 Lumbago with sciatica, right side: Secondary | ICD-10-CM

## 2018-07-04 DIAGNOSIS — I10 Essential (primary) hypertension: Secondary | ICD-10-CM | POA: Diagnosis not present

## 2018-07-04 DIAGNOSIS — F32A Depression, unspecified: Secondary | ICD-10-CM

## 2018-07-04 DIAGNOSIS — M8448XD Pathological fracture, other site, subsequent encounter for fracture with routine healing: Secondary | ICD-10-CM

## 2018-07-04 DIAGNOSIS — Z9989 Dependence on other enabling machines and devices: Secondary | ICD-10-CM

## 2018-07-04 NOTE — Progress Notes (Signed)
Provider:  Blanchie Serve MD  Location:  [facility] Room Number: 24 Place of Service:  SNF ([phone])  PCP: Leanna Battles, MD Patient Care Team: Leanna Battles, MD as PCP - General (Internal Medicine) [name], MD (Hematology and Oncology) [name], MD as Attending Physician (Urology) [name], MD (Gastroenterology)  Extended Emergency Contact Information Primary Emergency Contact: Huffaker,Janet H Address: [address]          [address]          [place], [place] [address] [name] of [place] Phone: [phone] Mobile Phone: [phone] Relation: Spouse  Code Status: full code  Goals of Care: Advanced Directive information Advanced Directives 07/04/2018  Does Patient Have a Medical Advance Directive? Yes  Type of Paramedic of [place];Living will  Does patient want to make changes to medical advance directive? No - Patient declined  Copy of Many in Chart? Yes  Would patient like information on creating a medical advance directive? -      Chief Complaint  Patient presents with  . Readmit To SNF    readmission visit    HPI: Patient is a 84 y.o. male seen today for an admission visit. He is here from ED for rehabilitation with acute on chronic back pain limiting his ADLs. He was in the ED yesterday 07/03/18 with worsening back pain. He underwent CT scan lumbar spine showing subacute bilateral vertical sacral ala fractures with transverse component at S3 level and subacute fracture through right posterior lateral arthrodesis at L5-S1. These are felt to be from the fall in June. Neurosurgery was consulted and recommended no operative intervention from their side. They recommend physical therapy, pain management and bed rest with NWB until IR evaluation for possible sacroplasty. He is seen in his room today. He was at this facility for short term rehabilitation from  05/27/18-06/06/18 for acute on chronic back pain with unsteady gait. He has history of chronic back issues with kyphoscoliosis s/p right TLIF l2-3, L3-4, L4-5 in 2014 and decompression and fusion L2-5 in 2017. He also has medical history of CAD, BPH, Barrett's esophagus with hiatal hernia, urinary retention, OSA on CPAP among others. He now has an indwelling foley catheter.   Past Medical History:  Diagnosis Date  . Anemia   . Arthritis   . AVM (arteriovenous malformation)   . Barrett's esophagus   . BPH (benign prostatic hyperplasia)   . CAD (coronary artery disease)    only on stress test; never had MI, PCI or CABG.  In 2008, LVEF was reportedly normal. pt. denies  . Cataract   . Constipation   . CPAP (continuous positive airway pressure) dependence   . Depression   . Diverticulosis    diverticular bleeding  . Diverticulosis   . GERD (gastroesophageal reflux disease)   . History of radiation therapy   . Hyperlipidemia   . Hypoglycemia   . Lymphocytosis 06/2012  . Mastodynia   . Murmur    slight  . Nasal congestion   . Osteoporosis   . Osteoporosis   . Prostate cancer ([place])   . Sleep apnea    wears CPAP  . Urinary retention with incomplete bladder emptying    pt uses bathroom and within 30 mins needs to go again  . Vitamin D deficiency    Past Surgical History:  Procedure Laterality Date  . AMPUTATION TOE Right 09/20/2017   Procedure: Right 4th toe amputation;  Surgeon: Wylene Simmer, MD;  Location: [facility];  Service: Orthopedics;  Laterality: Right;  foot block  . BACK SURGERY  04/2013  . back surgury     f  . BLEPHAROPLASTY Bilateral 02/23/15  . CATARACT EXTRACTION    . COLONOSCOPY    . COLONOSCOPY N/A 10/02/2017   Procedure: COLONOSCOPY;  Surgeon: Doran Stabler, MD;  Location: [facility];  Service: Gastroenterology;  Laterality: N/A;  . COLONOSCOPY WITH PROPOFOL N/A 10/01/2017   Procedure: COLONOSCOPY WITH PROPOFOL;  Surgeon: Doran Stabler,  MD;  Location: [facility];  Service: Gastroenterology;  Laterality: N/A;  . COLONOSCOPY WITH PROPOFOL N/A 03/07/2018   Procedure: COLONOSCOPY WITH PROPOFOL;  Surgeon: [name], MD;  Location: [facility];  Service: Endoscopy;  Laterality: N/A;  . ESOPHAGOGASTRODUODENOSCOPY (EGD) WITH PROPOFOL N/A 10/01/2017   Procedure: ESOPHAGOGASTRODUODENOSCOPY (EGD) WITH PROPOFOL;  Surgeon: Doran Stabler, MD;  Location: [facility];  Service: Gastroenterology;  Laterality: N/A;  . EYE SURGERY Bilateral   . FINGER SURGERY Right    right thumb  . HARDWARE REMOVAL Right 05/15/2016   Procedure: Right Lumbar Two-Lumbar Five Removal of Hardware;  Surgeon: Kristeen Miss, MD;  Location: [facility];  Service: Neurosurgery;  Laterality: Right;  Right L2-5 removal of hardware  . INGUINAL HERNIA REPAIR     x4  . REPAIR SPIGELIAN HERNIA  2011  . [place] ,2014  . TONSILLECTOMY    . UPPER GASTROINTESTINAL ENDOSCOPY    . VASECTOMY      reports that he has never smoked. He has never used smokeless tobacco. He reports that he does not drink alcohol or use drugs. Social History   Socioeconomic History  . Marital status: Married    Spouse name: [name]  . Number of children: 3  . Years of education: College  . Highest education level: Not on file  Occupational History  . 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 Financial resource strain: Not on file  . Food insecurity:    Worry: Not on file    Inability: Not on file  . Transportation needs:    Medical: Not on file    Non-medical: Not on file  Tobacco Use  . Smoking status: Never Smoker  . Smokeless tobacco: Never Used  Substance and Sexual Activity  . Alcohol use: No  . Drug use: No  . Sexual activity: Never  Lifestyle  . Physical activity:    Days per week: Not on file    Minutes per session: Not on file  . Stress: Not on file  Relationships  . Social connections:    Talks on  phone: Not on file    Gets together: Not on file    Attends religious service: Not on file    Active member of club or organization: Not on file    Attends meetings of clubs or organizations: Not on file    Relationship status: Not on file  . Intimate partner violence:    Fear of current or ex partner: Not on file    Emotionally abused: Not on file    Physically abused: Not on file    Forced sexual activity: Not on file  Other Topics Concern  . Not on file  Social History Narrative   Patient is married [name]).   Patient is a retired Arboriculturist.   Patient lives in [facility] facility.   Patient  has three adult children.   Patient does not drink any caffeine.   Patient is right-handed.    Functional Status Survey:    Family History  Problem Relation Age of Onset  . Prostate cancer Father   . CAD Mother   . Arthritis-Osteo Brother   . Colon cancer Neg Hx   . Stomach cancer Neg Hx     Health Maintenance  Topic Date Due  . HEMOGLOBIN A1C  09/05/33  . FOOT EXAM  08/13/1943  . OPHTHALMOLOGY EXAM  08/13/1943  . URINE MICROALBUMIN  08/13/1943  . TETANUS/TDAP  08/12/1952  . PNA vac Low Risk Adult (1 of 2 - PCV13) 08/12/1998  . INFLUENZA VACCINE  07/04/2018    Allergies  Allergen Reactions  . Celebrex [Celecoxib] Other (See Comments)    Created stomach ulcers.  Yvette Rack [Cyclobenzaprine] Other (See Comments)    Caught in hiatal hernia and caused extreme pain    Outpatient Encounter Medications as of 07/04/2018  Medication Sig  . buPROPion (WELLBUTRIN SR) 150 MG 12 hr tablet Take 150 mg by mouth 2 (two) times daily.   . carboxymethylcellul-glycerin (REFRESH REPAIR) 0.5-0.9 % ophthalmic solution Place 1 drop into both eyes 3 (three) times daily.  . cholecalciferol (VITAMIN D) 1000 UNITS tablet Take 1,000 Units by mouth daily.  Marland Kitchen docusate sodium (COLACE) 100 MG capsule Take 100 mg by mouth daily as needed for mild constipation.   . ferrous sulfate 325 (65 FE) MG  EC tablet Take 325 mg by mouth daily with breakfast.  . gabapentin (NEURONTIN) 300 MG capsule Take 1 tablet PO at 4 PM  and 1 tablet at 10 PM  . HYDROcodone-acetaminophen (NORCO/VICODIN) 5-325 MG tablet Take 1 tablet by mouth every 6 (six) hours as needed for moderate pain.  Marland Kitchen ipratropium (ATROVENT) 0.03 % nasal spray Place 2 sprays into both nostrils 2 (two) times daily.   Marland Kitchen lovastatin (MEVACOR) 20 MG tablet Take 20 mg by mouth at bedtime.   . Magnesium 250 MG TABS Take 250 mg by mouth daily.   . methocarbamol (ROBAXIN) 500 MG tablet Take 500 mg by mouth every 6 (six) hours as needed for muscle spasms.   . Multiple Minerals-Vitamins (CITRACAL PLUS BONE DENSITY) TABS Take 1 tablet by mouth 2 (two) times daily.   . Multiple Vitamins-Minerals (CENTRUM ADULTS PO) Take 1 tablet by mouth daily.  . Multiple Vitamins-Minerals (PRESERVISION AREDS 2 PO) Take 1 capsule by mouth 2 (two) times daily.  Marland Kitchen omeprazole (PRILOSEC) 20 MG capsule Take 20 mg by mouth 2 (two) times daily.  Marland Kitchen oxyCODONE (ROXICODONE) 5 MG immediate release tablet Take 1 tablet (5 mg total) by mouth every 6 (six) hours as needed for severe pain.  . Polyethylene Glycol 3350 (MIRALAX PO) Take 1 Scoop by mouth daily.   . propranolol (INDERAL) 10 MG tablet Take 10 mg by mouth daily.   . ranitidine (ZANTAC) 300 MG tablet TAKE 1 TABLET(300 MG) BY MOUTH AT BEDTIME  . rOPINIRole (REQUIP) 2 MG tablet Take 2 mg by mouth at bedtime.  . Tamsulosin HCl (FLOMAX) 0.4 MG CAPS Take 0.4 mg by mouth 2 (two) times daily.   . traMADol (ULTRAM) 50 MG tablet Take 100 mg by mouth 2 (two) times daily as needed.  . [DISCONTINUED] HYDROcodone-acetaminophen (NORCO/VICODIN) 5-325 MG tablet Take 1-2 tablets by mouth every 4 (four) hours as needed. (Patient taking differently: Take 1 tablet by mouth every 6 (six) hours as needed for moderate pain. )  . [DISCONTINUED] omeprazole (PRILOSEC)  40 MG capsule TAKE 1 CAPSULE(40 MG) BY MOUTH TWICE DAILY  . [DISCONTINUED]  rOPINIRole (REQUIP) 2 MG tablet Take 4 mg by mouth at bedtime.   . [DISCONTINUED] traMADol (ULTRAM) 50 MG tablet Take 2 tablets (100 mg total) by mouth 2 (two) times daily as needed for moderate pain. (Patient taking differently: Take 100 mg by mouth every 6 (six) hours as needed for moderate pain. )   No facility-administered encounter medications on file as of 07/04/2018.     Review of Systems  Constitutional: Positive for appetite change and fatigue. Negative for chills and fever.  HENT: Negative for congestion, mouth sores and trouble swallowing.   Respiratory: Negative for cough, shortness of breath and wheezing.   Cardiovascular: Negative for chest pain and palpitations.  Gastrointestinal: Negative for abdominal pain, blood in stool, constipation, diarrhea, nausea and vomiting.  Genitourinary: Negative for dysuria, flank pain and hematuria.       Has indwelling foley catheter  Musculoskeletal: Positive for back pain and gait problem.  Neurological: Positive for weakness and numbness. Negative for dizziness and headaches.  Psychiatric/Behavioral: Positive for confusion. Negative for behavioral problems.    Vitals:   07/04/18 1216  BP: 138/75  Pulse: 67  Resp: 16  Temp: 97.8 F (36.6 C)  TempSrc: Oral  SpO2: 98%  Weight: 167 lb 12.8 oz (76.1 kg)  Height: 5\' 6"  (1.676 m)   Body mass index is 27.08 kg/m.   Wt Readings from Last 3 Encounters:  07/04/18 167 lb 12.8 oz (76.1 kg)  06/12/18 170 lb (77.1 kg)  06/06/18 167 lb 12.8 oz (76.1 kg)   Physical Exam  Constitutional: He appears well-developed and well-nourished. No distress.  HENT:  Head: Normocephalic and atraumatic.  Mouth/Throat: Oropharynx is clear and moist.  Eyes: Conjunctivae and EOM are normal. Right eye exhibits no discharge. Left eye exhibits no discharge.  Neck: Normal range of motion. Neck supple.  Cardiovascular: Normal rate and regular rhythm.  Pulmonary/Chest: Effort normal and breath sounds normal. No  respiratory distress. He has no wheezes. He has no rales.  Abdominal: Soft. Bowel sounds are normal. He exhibits no distension. There is no tenderness. There is no guarding.  Musculoskeletal: He exhibits no edema.  Patient needs 2 person assistance to turn to the side. No spinal or paraspinal tenderness. Can move all 4 extremities but generalized weakness present, limited ROM with right shoulder with pain   Lymphadenopathy:    He has no cervical adenopathy.  Neurological: He is alert. He exhibits normal muscle tone.  Oriented to person and place but not to time  Skin: Skin is warm and dry. He is not diaphoretic.  Psychiatric:  Pleasantly confused, somewhat sleepy this morning    Labs reviewed: Basic Metabolic Panel: Recent Labs    09/30/17 0304 10/01/17 0525 10/03/17 2130 12/21/17 03/12/18 05/30/18  NA 137 144 140 139 138 138  K 3.8 3.7 3.8 4.5 4.9 4.3  CL 107 112* 109  --   --  104  CO2 24 23 23   --   --  26  GLUCOSE 104* 100* 93  --   --   --   BUN 21* 13 13 20 19  22*  CREATININE 0.88 0.86 0.84 0.9 0.8 0.8  CALCIUM 8.7* 8.9 9.2  --   --  8.6   Liver Function Tests: Recent Labs    09/29/17 1941 10/01/17 0525 05/30/18  AST 53* 51* 32  ALT 23 24 16   ALKPHOS 76 62 112  BILITOT 0.4  0.7  --   PROT 6.8 6.9 6.2  ALBUMIN 3.8 3.9 3.3   No results for input(s): LIPASE, AMYLASE in the last 8760 hours. No results for input(s): AMMONIA in the last 8760 hours. CBC: Recent Labs    10/17/17 1100  02/28/18 1126  04/30/18 05/17/18 1524 05/30/18  WBC 5.1   < > 6.1   < > 5.4 7.3 6.9  NEUTROABS 3.7  --  4.5  --   --  5.6  --   HGB 11.2*   < > 10.6*   < > 11.9* 12.0* 11.9*  HCT 33.6*   < > 31.6*   < > 38* 35.8* 36*  MCV 91.0  --  89.5  --   --  87.7  --   PLT 223.0   < > 178.0   < > 193 197.0 312   < > = values in this interval not displayed.   Cardiac Enzymes: No results for input(s): CKTOTAL, CKMB, CKMBINDEX, TROPONINI in the last 8760 hours. BNP: Invalid input(s):  POCBNP No results found for: HGBA1C No results found for: TSH Lab Results  Component Value Date   VITAMINB12 314 07/25/2013   Lab Results  Component Value Date   FOLATE >24.8 07/25/2013   Lab Results  Component Value Date   IRON 19 (L) 07/25/2013   FERRITIN 8.1 (L) 07/25/2013    Imaging and Procedures obtained prior to SNF admission: Ct Lumbar Spine Wo Contrast  Result Date: 07/03/2018 CLINICAL DATA:  Fall 6 weeks ago with negative x-rays. Worsening for 2 days EXAM: CT LUMBAR SPINE WITHOUT CONTRAST TECHNIQUE: Multidetector CT imaging of the lumbar spine was performed without intravenous contrast administration. Multiplanar CT image reconstructions were also generated. COMPARISON:  01/10/2017 FINDINGS: Segmentation: Transitional S1 vertebra with lumbarization. The same numbering scheme was used on prior. Alignment: Fused anterolisthesis at L5-S1. Reversal of lordosis in the upper lumbar spine. Vertebrae: Bilateral vertical sacral ala fractures extending to the anterior sacral foramina without visible narrowing. There is also transverse component at the S3 level. This shows patchy heterogeneous sclerosis compatible with subacute timing. Related likely subacute fracture through right posterior-lateral arthrodesis at L5 and S1, lateral to the pedicles and at the level of transverse processes. Endplate erosions at D3-2, with sclerosis, is chronic and degenerative when compared to prior. Paraspinal and other soft tissues: Postoperative scarring dorsally. There is presacral edema related to fractures. Hiatal hernia. Disc levels: T12- L1: Unremarkable. L1-L2: Severe disc disease with endplate degeneration, disc collapse, and vacuum phenomenon. Bilateral foraminal narrowing. L2-L3: PLIF with solid arthrodesis. L3-L4: PLIF with solid arthrodesis.  Broad laminectomy. L4-L5: PLIF with solid arthrodesis.  Broad laminectomy. L5-S1:PLIF with fused anterolisthesis. There is right more than left foraminal  narrowing from disc height loss. IMPRESSION: 1. Vertical and transverse sacral insufficiency fractures that are likely subacute. 2. Fracture through L5-S1 right posterior-lateral arthrodesis at the level of the transverse processes. 3. L2-S1 fusion. 4. Severe L1-2 adjacent segment disc degeneration. 5. Transitional S1 vertebra. Electronically Signed   By: Monte Fantasia M.D.   On: 07/03/2018 11:19    Assessment/Plan  1. Unsteady gait Will have him work with physical therapy and occupational therapy team to help with gait training and muscle strengthening exercises.fall precautions. Skin care. Encourage to be out of bed.   2. Generalized weakness From worsening back pain, PT and OT consult for gait training and ROM exercise, make follow up with neurosurgery   3. Chronic bilateral low back pain with bilateral sciatica Continue norco 5-325  mg q6h prn with robaxin 500 mg q6h prn muscle spasm. Continue gabapentin 300 mg bid for now for sciatica. Change tramadol to 100 mg bid scheduled. D/c oxycodone. Continue bowel regimen and monitor for constipation. Neurosurgery do not feel operative intervention is indicated here. Fall precautions. Neurosurgery follow up.  4. Essential hypertension  continue his lovastatin with history of CAD  5. OSA on CPAP Resume his home CPAP  6. Barrett's esophagus without dysplasia Continue omeprazole with ranitidine and monitor  7. Impingement syndrome of right shoulder ROM exercise as tolerated with therapy team. Pain regimen as above.   8. Urinary retention Has indwelling foley catheter. Continue foley care. Continue flomax. Urology follow up  9. Mild cognitive impairment SLP consult, aspiration precautions  10. Anemia of chronic disease Continue ferrous sulfate, check cbc  11. Neuropathy Continue requip 2 mg qhs  12. Depression Monitor his mood. Continue bupropion 150 mg bid  13. Sacral insufficiency fracture Neurosurgery recommends bedrest and NWB  until IR evaluates for sacroplasty. IR referral requested for further evaluation. Will schedule his tramadol at 100 mg bid for now   Family/ staff Communication: reviewed care plan with patient and charge nurse.    Labs/tests ordered: CBC, CMP, TSH  Blanchie Serve, MD Internal Medicine [facility] [address] [place], [place] [address] Cell Phone (Monday-Friday 8 am - 5 pm): [phone] On Call: [phone] and follow prompts after 5 pm and on weekends Office Phone: [phone] Office Fax: [phone]

## 2018-07-05 NOTE — Progress Notes (Signed)
I agree with the assessment and plan as directed by NP .The patient is known to me .   Maximilliano Kersh, MD  

## 2018-07-08 ENCOUNTER — Other Ambulatory Visit: Payer: Self-pay | Admitting: Internal Medicine

## 2018-07-08 ENCOUNTER — Telehealth: Payer: Self-pay | Admitting: Gastroenterology

## 2018-07-08 DIAGNOSIS — I10 Essential (primary) hypertension: Secondary | ICD-10-CM | POA: Diagnosis not present

## 2018-07-08 DIAGNOSIS — D649 Anemia, unspecified: Secondary | ICD-10-CM | POA: Diagnosis not present

## 2018-07-08 DIAGNOSIS — M6281 Muscle weakness (generalized): Secondary | ICD-10-CM | POA: Diagnosis not present

## 2018-07-08 DIAGNOSIS — S3219XS Other fracture of sacrum, sequela: Secondary | ICD-10-CM

## 2018-07-08 LAB — COMPLETE METABOLIC PANEL WITH GFR
ALBUMIN: 3.3
ALT: 67
AST: 102
Alkaline Phosphatase: 122
BUN: 22 — AB (ref 4–21)
Calcium: 8.4
Creat: 0.65
GFR CALC NON AF AMER: 89
GLUCOSE: 98
POTASSIUM: 4.4
Sodium: 132
Total Bilirubin: 0.5
Total Protein: 6.3 g/dL

## 2018-07-08 LAB — CBC
HCT: 35.5
HGB: 12
MCV: 85.7
PLATELET COUNT: 286
WBC: 7.8

## 2018-07-08 LAB — TSH: TSH: 2.35

## 2018-07-08 NOTE — Telephone Encounter (Signed)
Appt cancelled and Dr. Fuller Plan notified.

## 2018-07-09 ENCOUNTER — Encounter: Payer: Self-pay | Admitting: Family

## 2018-07-09 ENCOUNTER — Encounter: Payer: Self-pay | Admitting: *Deleted

## 2018-07-09 ENCOUNTER — Non-Acute Institutional Stay (SKILLED_NURSING_FACILITY): Payer: Medicare Other | Admitting: Family

## 2018-07-09 DIAGNOSIS — E785 Hyperlipidemia, unspecified: Secondary | ICD-10-CM | POA: Diagnosis not present

## 2018-07-09 DIAGNOSIS — R339 Retention of urine, unspecified: Secondary | ICD-10-CM

## 2018-07-09 DIAGNOSIS — E8809 Other disorders of plasma-protein metabolism, not elsewhere classified: Secondary | ICD-10-CM | POA: Diagnosis not present

## 2018-07-09 DIAGNOSIS — R748 Abnormal levels of other serum enzymes: Secondary | ICD-10-CM | POA: Diagnosis not present

## 2018-07-09 NOTE — Progress Notes (Addendum)
Location:  [facility] Room Number: 24 Place of Service:  SNF [phone]) Provider: Marqual Mi FNP-C  Leanna Battles, MD  Patient Care Team: Leanna Battles, MD as PCP - General (Internal Medicine) [name], MD (Hematology and Oncology) [name], MD as Attending Physician (Urology) [name], MD (Gastroenterology)  Extended Emergency Contact Information Primary Emergency Contact: Snowden,Janet H Address: [address]          [address]          [place], [place] [address] [name] of [place] Phone: [phone] Mobile Phone: [phone] Relation: Spouse  Code Status:  Full Code  Goals of care: Advanced Directive information Advanced Directives 07/09/2018  Does Patient Have a Medical Advance Directive? Yes  Type of Paramedic of [place];Living will  Does patient want to make changes to medical advance directive? -  Copy of [place] in Chart? Yes  Would patient like information on creating a medical advance directive? -     Chief Complaint  Patient presents with  . Acute Visit    abnormal labs    HPI:  Pt is a 84 y.o. male seen today at [facility] for an acute visit for evaluation of abnormal lab results.He is seen in his room today with wife at bedside.He continues to complain of right thigh muscle spasm.He states Nurse gave him a muscle relaxant prior to visit.Labwork done 07/08/2018 results shows elevated ALk phos 122,AST 102,ALT67,Hgb 12.0,HCT 35,ALB 3.3.    Past Medical History:  Diagnosis Date  . Anemia   . Arthritis   . AVM (arteriovenous malformation)   . Barrett's esophagus   . BPH (benign prostatic hyperplasia)   . CAD (coronary artery disease)    only on stress test; never had MI, PCI or CABG.  In 2008, LVEF was reportedly normal. pt. denies  . Cataract   . Constipation   . CPAP (continuous positive airway pressure) dependence   . Depression   .  Diverticulosis    diverticular bleeding  . Diverticulosis   . GERD (gastroesophageal reflux disease)   . History of radiation therapy   . Hyperlipidemia   . Hypoglycemia   . Lymphocytosis 06/2012  . Mastodynia   . Murmur    slight  . Nasal congestion   . Osteoporosis   . Osteoporosis   . Prostate cancer ([place])   . Sleep apnea    wears CPAP  . Urinary retention with incomplete bladder emptying    pt uses bathroom and within 30 mins needs to go again  . Vitamin D deficiency    Past Surgical History:  Procedure Laterality Date  . AMPUTATION TOE Right 09/20/2017   Procedure: Right 4th toe amputation;  Surgeon: Wylene Simmer, MD;  Location: [facility];  Service: Orthopedics;  Laterality: Right;  foot block  . BACK SURGERY  04/2013  . back surgury     f  . BLEPHAROPLASTY Bilateral 02/23/15  . CATARACT EXTRACTION    . COLONOSCOPY    . COLONOSCOPY N/A 10/02/2017   Procedure: COLONOSCOPY;  Surgeon: Doran Stabler, MD;  Location: [facility];  Service: Gastroenterology;  Laterality: N/A;  . COLONOSCOPY WITH PROPOFOL N/A 10/01/2017   Procedure: COLONOSCOPY WITH PROPOFOL;  Surgeon: Doran Stabler, MD;  Location: [facility];  Service: Gastroenterology;  Laterality: N/A;  . COLONOSCOPY WITH PROPOFOL N/A 03/07/2018   Procedure: COLONOSCOPY WITH PROPOFOL;  Surgeon: [name], MD;  Location: [facility];  Service: Endoscopy;  Laterality: N/A;  . ESOPHAGOGASTRODUODENOSCOPY (EGD) WITH PROPOFOL N/A 10/01/2017   Procedure: ESOPHAGOGASTRODUODENOSCOPY (EGD) WITH PROPOFOL;  Surgeon: Doran Stabler, MD;  Location: [facility];  Service: Gastroenterology;  Laterality: N/A;  . EYE SURGERY Bilateral   . FINGER SURGERY Right    right thumb  . HARDWARE REMOVAL Right 05/15/2016   Procedure: Right Lumbar Two-Lumbar Five Removal of Hardware;  Surgeon: Kristeen Miss, MD;  Location: [facility];  Service: Neurosurgery;  Laterality: Right;  Right L2-5 removal of hardware  .  INGUINAL HERNIA REPAIR     x4  . REPAIR SPIGELIAN HERNIA  2011  . [place] ,2014  . TONSILLECTOMY    . UPPER GASTROINTESTINAL ENDOSCOPY    . VASECTOMY      Allergies  Allergen Reactions  . Celebrex [Celecoxib] Other (See Comments)    Created stomach ulcers.  Yvette Rack [Cyclobenzaprine] Other (See Comments)    Caught in hiatal hernia and caused extreme pain    Outpatient Encounter Medications as of 07/09/2018  Medication Sig  . Amino Acids-Protein Hydrolys (FEEDING SUPPLEMENT, PRO-STAT SUGAR FREE 64,) LIQD Take 30 mLs by mouth daily.  Marland Kitchen buPROPion (WELLBUTRIN SR) 150 MG 12 hr tablet Take 150 mg by mouth 2 (two) times daily.   . carboxymethylcellul-glycerin (REFRESH REPAIR) 0.5-0.9 % ophthalmic solution Place 1 drop into both eyes 3 (three) times daily.  . cholecalciferol (VITAMIN D) 1000 UNITS tablet Take 1,000 Units by mouth daily.  Marland Kitchen docusate sodium (COLACE) 100 MG capsule Take 100 mg by mouth daily as needed for mild constipation.   . ferrous sulfate 325 (65 FE) MG EC tablet Take 325 mg by mouth daily with breakfast.  . gabapentin (NEURONTIN) 300 MG capsule Take 1 tablet PO at 4 PM  and 1 tablet at 10 PM  . HYDROcodone-acetaminophen (NORCO/VICODIN) 5-325 MG tablet Take 1 tablet by mouth every 6 (six) hours as needed for moderate pain.  Marland Kitchen ipratropium (ATROVENT) 0.03 % nasal spray Place 2 sprays into both nostrils 2 (two) times daily.   Marland Kitchen lovastatin (MEVACOR) 20 MG tablet Take 20 mg by mouth at bedtime.   . Magnesium 250 MG TABS Take 250 mg by mouth daily.   . methocarbamol (ROBAXIN) 500 MG tablet Take 500 mg by mouth every 6 (six) hours as needed for muscle spasms.   . Multiple Minerals-Vitamins (CITRACAL PLUS BONE DENSITY) TABS Take 1 tablet by mouth 2 (two) times daily.   . Multiple Vitamins-Minerals (CENTRUM ADULTS PO) Take 1 tablet by mouth daily.  . Multiple Vitamins-Minerals (PRESERVISION AREDS 2 PO) Take 1 capsule by mouth 2 (two) times daily.  Marland Kitchen omeprazole  (PRILOSEC) 20 MG capsule Take 20 mg by mouth 2 (two) times daily.  . Polyethylene Glycol 3350 (MIRALAX PO) Take 1 Scoop by mouth daily.   . propranolol (INDERAL) 10 MG tablet Take 10 mg by mouth daily.   . ranitidine (ZANTAC) 300 MG tablet TAKE 1 TABLET(300 MG) BY MOUTH AT BEDTIME  . rOPINIRole (REQUIP) 4 MG tablet Take 4 mg by mouth at bedtime.  . Tamsulosin HCl (FLOMAX) 0.4 MG CAPS Take 0.4 mg by mouth 2 (two) times daily.   . traMADol (ULTRAM) 50 MG tablet Take 100 mg by mouth 2 (two) times daily as needed.  . [DISCONTINUED] oxyCODONE (ROXICODONE) 5 MG immediate release tablet Take 1 tablet (5 mg total) by mouth every 6 (six) hours as needed for severe pain.  . [DISCONTINUED] rOPINIRole (REQUIP) 2 MG  tablet Take 2 mg by mouth at bedtime.   No facility-administered encounter medications on file as of 07/09/2018.     Review of Systems  Constitutional: Negative for appetite change and fever.  HENT: Negative for congestion, rhinorrhea, sinus pressure, sinus pain, sneezing and sore throat.   Eyes: Negative for discharge, redness and itching.  Respiratory: Negative for cough, chest tightness, shortness of breath and wheezing.   Cardiovascular: Negative for chest pain, palpitations and leg swelling.  Gastrointestinal: Negative for abdominal distention, abdominal pain, constipation, diarrhea, nausea and vomiting.  Endocrine: Negative for cold intolerance, heat intolerance, polydipsia, polyphagia and polyuria.  Genitourinary: Negative for urgency.       Indwelling foley catheter   Musculoskeletal: Positive for arthralgias, back pain and gait problem.  Skin: Negative for color change, pallor and rash.  Neurological: Negative for dizziness, light-headedness and headaches.       Generalized weakness   Psychiatric/Behavioral: Negative for agitation and sleep disturbance. The patient is not nervous/anxious.     Immunization History  Administered Date(s) Administered  . Zoster Recombinat (Shingrix)  10/17/2017, 01/25/2018   Pertinent  Health Maintenance Due  Topic Date Due  . HEMOGLOBIN A1C  1931/08/22  . FOOT EXAM  08/13/1943  . OPHTHALMOLOGY EXAM  08/13/1941  . URINE MICROALBUMIN  08/13/1943  . PNA vac Low Risk Adult (1 of 2 - PCV13) 08/12/1998  . INFLUENZA VACCINE  07/04/2018   Fall Risk  01/04/2017 10/06/2016 09/29/2016 09/22/2016 07/19/2016  Falls in the past year? No No No No No   Functional Status Survey:    Vitals:   07/09/18 1101  BP: 124/69  Pulse: 70  Resp: 20  Temp: 98.1 F (36.7 C)  SpO2: 97%  Weight: 167 lb 12.8 oz (76.1 kg)  Height: '5\' 6"'  (1.676 m)   Body mass index is 27.08 kg/m. Physical Exam  Constitutional:  Tall built elderly in no acute distress   HENT:  Head: Normocephalic.  Mouth/Throat: Oropharynx is clear and moist. No oropharyngeal exudate.  Eyes: Pupils are equal, round, and reactive to light. Conjunctivae are normal. Right eye exhibits no discharge. Left eye exhibits no discharge. No scleral icterus.  Neck: Normal range of motion. No JVD present. No thyromegaly present.  Cardiovascular: Normal rate, regular rhythm, normal heart sounds and intact distal pulses. Exam reveals no gallop and no friction rub.  No murmur heard. Pulmonary/Chest: Effort normal and breath sounds normal. No respiratory distress. He has no wheezes. He has no rales.  Abdominal: Soft. Bowel sounds are normal. He exhibits no distension and no mass. There is no tenderness. There is no rebound and no guarding.  Genitourinary:  Genitourinary Comments: Indwelling foley catheter draining adequate amounts of dark yellow urine.   Musculoskeletal: He exhibits no edema or tenderness.  Moves x 4 extremities requires assistance with repositioning in bed.   Lymphadenopathy:    He has no cervical adenopathy.  Neurological:  Alert and oriented to person and place  Skin: Skin is warm and dry. No rash noted. No erythema. No pallor.  Psychiatric: He has a normal mood and affect. His  speech is normal and behavior is normal. Judgment and thought content normal.  Nursing note and vitals reviewed.   Labs reviewed: Recent Labs    09/30/17 0304 10/01/17 0525 10/03/17 2130  03/12/18 05/30/18 07/08/18  NA 137 144 140   < > 138 138 132  K 3.8 3.7 3.8   < > 4.9 4.3 4.4  CL 107 112* 109  --   --  104  --   CO2 '24 23 23  ' --   --  26  --   GLUCOSE 104* 100* 93  --   --   --   --   BUN 21* 13 13   < > 19 22* 22*  CREATININE 0.88 0.86 0.84   < > 0.8 0.8 0.65  CALCIUM 8.7* 8.9 9.2  --   --  8.6 8.4   < > = values in this interval not displayed.   Recent Labs    09/29/17 1941 10/01/17 0525 05/30/18 07/08/18  AST 53* 51* 32 102  ALT '23 24 16 ' 67  ALKPHOS 76 62 112 122  BILITOT 0.4 0.7  --  0.5  PROT 6.8 6.9 6.2 6.3  ALBUMIN 3.8 3.9 3.3 3.3   Recent Labs    10/17/17 1100  02/28/18 1126  04/30/18 05/17/18 1524 05/30/18 07/08/18  WBC 5.1   < > 6.1   < > 5.4 7.3 6.9 7.8  NEUTROABS 3.7  --  4.5  --   --  5.6  --   --   HGB 11.2*   < > 10.6*   < > 11.9* 12.0* 11.9* 12.0  HCT 33.6*   < > 31.6*   < > 38* 35.8* 36* 35.5  MCV 91.0  --  89.5  --   --  87.7  --  85.7  PLT 223.0   < > 178.0   < > 193 197.0 312  --    < > = values in this interval not displayed.   Lab Results  Component Value Date   TSH 2.35 07/08/2018   No results found for: HGBA1C Lab Results  Component Value Date   CHOL 141 05/30/2018   HDL 34 (A) 05/30/2018   LDLCALC 88 05/30/2018   TRIG 91 05/30/2018    Significant Diagnostic Results in last 30 days:  Ct Lumbar Spine Wo Contrast  Result Date: 07/03/2018 CLINICAL DATA:  Fall 6 weeks ago with negative x-rays. Worsening for 2 days EXAM: CT LUMBAR SPINE WITHOUT CONTRAST TECHNIQUE: Multidetector CT imaging of the lumbar spine was performed without intravenous contrast administration. Multiplanar CT image reconstructions were also generated. COMPARISON:  01/10/2017 FINDINGS: Segmentation: Transitional S1 vertebra with lumbarization. The same numbering  scheme was used on prior. Alignment: Fused anterolisthesis at L5-S1. Reversal of lordosis in the upper lumbar spine. Vertebrae: Bilateral vertical sacral ala fractures extending to the anterior sacral foramina without visible narrowing. There is also transverse component at the S3 level. This shows patchy heterogeneous sclerosis compatible with subacute timing. Related likely subacute fracture through right posterior-lateral arthrodesis at L5 and S1, lateral to the pedicles and at the level of transverse processes. Endplate erosions at I7-1, with sclerosis, is chronic and degenerative when compared to prior. Paraspinal and other soft tissues: Postoperative scarring dorsally. There is presacral edema related to fractures. Hiatal hernia. Disc levels: T12- L1: Unremarkable. L1-L2: Severe disc disease with endplate degeneration, disc collapse, and vacuum phenomenon. Bilateral foraminal narrowing. L2-L3: PLIF with solid arthrodesis. L3-L4: PLIF with solid arthrodesis.  Broad laminectomy. L4-L5: PLIF with solid arthrodesis.  Broad laminectomy. L5-S1:PLIF with fused anterolisthesis. There is right more than left foraminal narrowing from disc height loss. IMPRESSION: 1. Vertical and transverse sacral insufficiency fractures that are likely subacute. 2. Fracture through L5-S1 right posterior-lateral arthrodesis at the level of the transverse processes. 3. L2-S1 fusion. 4. Severe L1-2 adjacent segment disc degeneration. 5. Transitional S1 vertebra. Electronically Signed   By: Monte Fantasia  M.D.   On: 07/03/2018 11:19    Assessment/Plan 1. Elevated liver enzymes ALk phos 122,AST 102,ALT67 (07/08/2018). Bilirubin within normal range.Possible bone related due given history of prostate Cancer.currently on lovastatin 20 mg tablet daily.will decrease lovastatin to 10 mg tablet daily at bedtime.Recheck CMP 07/18/2018.   2. Urinary retention Indwelling foley catheter draining dark yellow urine per Dr.Pandey need voiding  trial.discontinue Foley catheter today.Nurse to reinsert foley catheter if unable to void by 5 pm today.Encourage fluid intake.Follow up with urologist in 1-2 weeks for urine retention.   3. Hypoalbuminemia ALB 3.3 (07/08/2018).encourage oral intake.follow up with facility Registered dietician.continue to monitor CMP.  4. Hyperlipidemia  LDL 88 (05/30/2018).Reduce lovastatin from 20 mg tablet daily to 10 mg tablet daily due to elevated liver enzyme.recheck fasting Lipid panel in 3 months.   Family/ staff Communication: Reviewed plan of care with Dr.Pandey, patient,patient's wife and facility Nurse.   Labs/tests ordered: CMP 07/18/2018. Lipid panel in 3 months.   Sandrea Hughs, NP

## 2018-07-10 ENCOUNTER — Encounter: Payer: Self-pay | Admitting: Radiology

## 2018-07-10 ENCOUNTER — Ambulatory Visit
Admission: RE | Admit: 2018-07-10 | Discharge: 2018-07-10 | Disposition: A | Discharge status: Home / Self Care | Payer: Medicare Other | Source: Ambulatory Visit | Attending: Internal Medicine | Admitting: Internal Medicine

## 2018-07-10 DIAGNOSIS — S3219XS Other fracture of sacrum, sequela: Secondary | ICD-10-CM

## 2018-07-10 DIAGNOSIS — S3210XA Unspecified fracture of sacrum, initial encounter for closed fracture: Secondary | ICD-10-CM | POA: Diagnosis not present

## 2018-07-10 HISTORY — PX: IR RADIOLOGIST EVAL & MGMT: IMG5224

## 2018-07-10 NOTE — Consult Note (Signed)
Chief Complaint: Bilateral sacral insufficiency fractures  Referring Physician(s): Pandey,Mahima  History of Present Illness: Charles [name] is a 84 y.o. male with past medical history significant for CAD, prostate cancer, sleep apnea (utilizes BiPAP machine), chronic back pain and osteoporosis who was in his baseline state of health until experiencing a fall from a seated position on the side of his bed in late June.  Lumbar spine radiographs performed at that time were negative for definitive fracture (note, patient has history of left-sided L2-S1 unilateral L5 paraspinal fusion).  Unfortunately, the patient's pain persisted prompting repeat presentation to the emergency department on July 31 at which time a lumbar spine CT was obtained demonstrating acute bilateral sacral insufficiency fractures.  As such, patient presents today to the interventional radiology clinic for evaluation and management.  He is accompanied by his wife though serves as his own historian.  Prior to experiencing these recent acute episodes, patient reports long-standing history of chronic low back pain which he states is typically on the range of 3 out of 10.  Since experiencing this fall, initially in late June, the patient's pain has been constant and very severe, currently at 9 out of 10 while sitting in his wheelchair.  Patient has been taking tramadol and Percocet however these medications make him sleepy and do little to alleviate his pain.    Patient states his pain is located in his tailbone and describes it is nonradiating.  He denies lower extremity paresthesias or discrete weakness.  He states his pain is different than his pre-existing back pain.  Patient is otherwise without complaint.  Specifically, no fever or chills.  No change in bowel or bladder function.  From a functional status, the patient and his wife reside at a nursing facility.  He Is currently utilizing a wheelchair for  mobility.  Past Medical History:  Diagnosis Date  . Anemia   . Arthritis   . AVM (arteriovenous malformation)   . Barrett's esophagus   . BPH (benign prostatic hyperplasia)   . CAD (coronary artery disease)    only on stress test; never had MI, PCI or CABG.  In 2008, LVEF was reportedly normal. pt. denies  . Cataract   . Constipation   . CPAP (continuous positive airway pressure) dependence   . Depression   . Diverticulosis    diverticular bleeding  . Diverticulosis   . GERD (gastroesophageal reflux disease)   . History of radiation therapy   . Hyperlipidemia   . Hypoglycemia   . Lymphocytosis 06/2012  . Mastodynia   . Murmur    slight  . Nasal congestion   . Osteoporosis   . Osteoporosis   . Prostate cancer ([place])   . Sleep apnea    wears CPAP  . Urinary retention with incomplete bladder emptying    pt uses bathroom and within 30 mins needs to go again  . Vitamin D deficiency     Past Surgical History:  Procedure Laterality Date  . AMPUTATION TOE Right 09/20/2017   Procedure: Right 4th toe amputation;  Surgeon: Wylene Simmer, MD;  Location: [facility];  Service: Orthopedics;  Laterality: Right;  foot block  . BACK SURGERY  04/2013  . back surgury     f  . BLEPHAROPLASTY Bilateral 02/23/15  . CATARACT EXTRACTION    . COLONOSCOPY    . COLONOSCOPY N/A 10/02/2017   Procedure: COLONOSCOPY;  Surgeon: Doran Stabler, MD;  Location: [facility];  Service: Gastroenterology;  Laterality: N/A;  . COLONOSCOPY WITH PROPOFOL N/A 10/01/2017   Procedure: COLONOSCOPY WITH PROPOFOL;  Surgeon: Doran Stabler, MD;  Location: [facility];  Service: Gastroenterology;  Laterality: N/A;  . COLONOSCOPY WITH PROPOFOL N/A 03/07/2018   Procedure: COLONOSCOPY WITH PROPOFOL;  Surgeon: [name], MD;  Location: [facility];  Service: Endoscopy;  Laterality: N/A;  . ESOPHAGOGASTRODUODENOSCOPY (EGD) WITH PROPOFOL N/A 10/01/2017   Procedure: ESOPHAGOGASTRODUODENOSCOPY  (EGD) WITH PROPOFOL;  Surgeon: Doran Stabler, MD;  Location: [facility];  Service: Gastroenterology;  Laterality: N/A;  . EYE SURGERY Bilateral   . FINGER SURGERY Right    right thumb  . HARDWARE REMOVAL Right 05/15/2016   Procedure: Right Lumbar Two-Lumbar Five Removal of Hardware;  Surgeon: Kristeen Miss, MD;  Location: [facility];  Service: Neurosurgery;  Laterality: Right;  Right L2-5 removal of hardware  . INGUINAL HERNIA REPAIR     x4  . IR RADIOLOGIST EVAL & MGMT  07/10/2018  . REPAIR SPIGELIAN HERNIA  2011  . [place] ,2014  . TONSILLECTOMY    . UPPER GASTROINTESTINAL ENDOSCOPY    . VASECTOMY      Allergies: Celebrex [celecoxib] and Flexeril [cyclobenzaprine]  Medications: Prior to Admission medications   Medication Sig Start Date End Date Taking? Authorizing Provider  Amino Acids-Protein Hydrolys (FEEDING SUPPLEMENT, PRO-STAT SUGAR FREE 64,) LIQD Take 30 mLs by mouth daily.    [provider]  buPROPion (WELLBUTRIN SR) 150 MG 12 hr tablet Take 150 mg by mouth 2 (two) times daily.  06/28/11   [provider]  carboxymethylcellul-glycerin (REFRESH REPAIR) 0.5-0.9 % ophthalmic solution Place 1 drop into both eyes 3 (three) times daily.    [provider]  cholecalciferol (VITAMIN D) 1000 UNITS tablet Take 1,000 Units by mouth daily.    [provider]  docusate sodium (COLACE) 100 MG capsule Take 100 mg by mouth daily as needed for mild constipation.     [provider]  ferrous sulfate 325 (65 FE) MG EC tablet Take 325 mg by mouth daily with breakfast.    [provider]  gabapentin (NEURONTIN) 300 MG capsule Take 1 tablet PO at 4 PM  and 1 tablet at 10 PM 06/20/16   Ward Givens, NP  HYDROcodone-acetaminophen (NORCO/VICODIN) 5-325 MG tablet Take 1 tablet by mouth every 6 (six) hours as needed for moderate pain.    [provider]  ipratropium (ATROVENT) 0.03 % nasal spray Place 2 sprays into both  nostrils 2 (two) times daily.     [provider]  lovastatin (MEVACOR) 20 MG tablet Take 20 mg by mouth at bedtime.  07/22/12   [provider]  Magnesium 250 MG TABS Take 250 mg by mouth daily.     [provider]  methocarbamol (ROBAXIN) 500 MG tablet Take 500 mg by mouth every 6 (six) hours as needed for muscle spasms.     [provider]  Multiple Minerals-Vitamins (CITRACAL PLUS BONE DENSITY) TABS Take 1 tablet by mouth 2 (two) times daily.     [provider]  Multiple Vitamins-Minerals (CENTRUM ADULTS PO) Take 1 tablet by mouth daily.    [provider]  Multiple Vitamins-Minerals (PRESERVISION AREDS 2 PO) Take 1 capsule by mouth 2 (two) times daily.    [provider]  omeprazole (PRILOSEC) 20 MG capsule Take 20 mg by mouth 2 (two) times daily.    [provider]  Polyethylene Glycol 3350 (MIRALAX PO) Take 1 Scoop by  mouth daily.     [provider]  propranolol (INDERAL) 10 MG tablet Take 10 mg by mouth daily.     [provider]  ranitidine (ZANTAC) 300 MG tablet TAKE 1 TABLET(300 MG) BY MOUTH AT BEDTIME 02/25/18   [name], MD  rOPINIRole (REQUIP) 4 MG tablet Take 4 mg by mouth at bedtime.    [provider]  Tamsulosin HCl (FLOMAX) 0.4 MG CAPS Take 0.4 mg by mouth 2 (two) times daily.  08/30/11   [provider]  traMADol (ULTRAM) 50 MG tablet Take 100 mg by mouth 2 (two) times daily as needed.    [provider]     Family History  Problem Relation Age of Onset  . Prostate cancer Father   . CAD Mother   . Arthritis-Osteo Brother   . Colon cancer Neg Hx   . Stomach cancer Neg Hx     Social History   Socioeconomic History  . Marital status: Married    Spouse name: [name]  . Number of children: 3  . Years of education: College  . Highest education level: Not on file  Occupational History  . 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 Financial resource strain: Not on file  . Food insecurity:    Worry: Not on file    Inability: Not on file  . Transportation needs:    Medical: Not on file    Non-medical: Not on file  Tobacco Use  . Smoking status: Never Smoker  . Smokeless tobacco: Never Used  Substance and Sexual Activity  . Alcohol use: No  . Drug use: No  . Sexual activity: Never  Lifestyle  . Physical activity:    Days per week: Not on file    Minutes per session: Not on file  . Stress: Not on file  Relationships  . Social connections:    Talks on phone: Not on file    Gets together: Not on file    Attends religious service: Not on file    Active member of club or organization: Not on file    Attends meetings of clubs or organizations: Not on file    Relationship status: Not on file  Other Topics Concern  . Not on file  Social History Narrative   Patient is married [name]).   Patient is a retired Arboriculturist.   Patient lives in [facility] living facility.   Patient has three adult children.   Patient does not drink any caffeine.   Patient is right-handed.    ECOG Status: 3 - Symptomatic, >50% confined to bed  Review of Systems: A 12 point ROS discussed and pertinent positives are indicated in the HPI above.  All other systems are negative.  Review of Systems  Constitutional: Positive for activity change and fatigue. Negative for appetite change, chills and fever.       The patient appears visibly uncomfortable sitting in his wheelchair constantly shifting and moving.  Respiratory: Negative.   Cardiovascular: Negative.   Musculoskeletal: Positive for gait problem.  Skin: Negative.     Vital Signs: BP 117/67   Pulse 74   Temp (!) 97.5 F (36.4 C) (Oral)   Resp 15   Ht 5\' 6"  (1.676 m)   Wt 168 lb (76.2 kg)   SpO2 97%   BMI 27.12 kg/m   Physical Exam  Constitutional: He appears well-developed and well-nourished.  HENT:  Head: Normocephalic  and  atraumatic.  Musculoskeletal:       Arms: Location of the patient's nonradiating sacral/pelvic pain    Imaging:  Selected images from lumbar spine CT performed 07/03/2018 was reviewed in detail  Ct Lumbar Spine Wo Contrast  Result Date: 07/03/2018 CLINICAL DATA:  Fall 6 weeks ago with negative x-rays. Worsening for 2 days EXAM: CT LUMBAR SPINE WITHOUT CONTRAST TECHNIQUE: Multidetector CT imaging of the lumbar spine was performed without intravenous contrast administration. Multiplanar CT image reconstructions were also generated. COMPARISON:  01/10/2017 FINDINGS: Segmentation: Transitional S1 vertebra with lumbarization. The same numbering scheme was used on prior. Alignment: Fused anterolisthesis at L5-S1. Reversal of lordosis in the upper lumbar spine. Vertebrae: Bilateral vertical sacral ala fractures extending to the anterior sacral foramina without visible narrowing. There is also transverse component at the S3 level. This shows patchy heterogeneous sclerosis compatible with subacute timing. Related likely subacute fracture through right posterior-lateral arthrodesis at L5 and S1, lateral to the pedicles and at the level of transverse processes. Endplate erosions at Y4-8, with sclerosis, is chronic and degenerative when compared to prior. Paraspinal and other soft tissues: Postoperative scarring dorsally. There is presacral edema related to fractures. Hiatal hernia. Disc levels: T12- L1: Unremarkable. L1-L2: Severe disc disease with endplate degeneration, disc collapse, and vacuum phenomenon. Bilateral foraminal narrowing. L2-L3: PLIF with solid arthrodesis. L3-L4: PLIF with solid arthrodesis.  Broad laminectomy. L4-L5: PLIF with solid arthrodesis.  Broad laminectomy. L5-S1:PLIF with fused anterolisthesis. There is right more than left foraminal narrowing from disc height loss. IMPRESSION: 1. Vertical and transverse sacral insufficiency fractures that are likely subacute. 2. Fracture through L5-S1  right posterior-lateral arthrodesis at the level of the transverse processes. 3. L2-S1 fusion. 4. Severe L1-2 adjacent segment disc degeneration. 5. Transitional S1 vertebra. Electronically Signed   By: Monte Fantasia M.D.   On: 07/03/2018 11:19   Ir Radiologist Eval & Mgmt  Result Date: 07/10/2018 Please refer to notes tab for details about interventional procedure. (Op Note)   Labs:  CBC: Recent Labs    03/12/18 04/30/18 05/17/18 1524 05/30/18 07/08/18  WBC 5.4 5.4 7.3 6.9 7.8  HGB 10.4* 11.9* 12.0* 11.9* 12.0  HCT 32* 38* 35.8* 36* 35.5  PLT 203 193 197.0 312  --     COAGS: Recent Labs    09/29/17 1941 10/03/17 2038  INR 1.07 0.95    BMP: Recent Labs    09/29/17 1941 09/30/17 0304 10/01/17 0525 10/03/17 2130 12/21/17 03/12/18 05/30/18 07/08/18  NA 139 137 144 140 139 138 138 132  K 4.0 3.8 3.7 3.8 4.5 4.9 4.3 4.4  CL 108 107 112* 109  --   --  104  --   CO2 23 24 23 23   --   --  26  --   GLUCOSE 117* 104* 100* 93  --   --   --   --   BUN 25* 21* 13 13 20 19  22* 22*  CALCIUM 9.1 8.7* 8.9 9.2  --   --  8.6 8.4  CREATININE 0.99 0.88 0.86 0.84 0.9 0.8 0.8 0.65  GFRNONAA >60 >60 >60 >60  --   --  83 89  GFRAA >60 >60 >60 >60  --   --   --   --     LIVER FUNCTION TESTS: Recent Labs    09/29/17 1941 10/01/17 0525 05/30/18 07/08/18  BILITOT 0.4 0.7  --  0.5  AST 53* 51* 32 102  ALT 23 24 16  67  ALKPHOS 76  62 112 122  PROT 6.8 6.9 6.2 6.3  ALBUMIN 3.8 3.9 3.3 3.3    TUMOR MARKERS: No results for input(s): AFPTM, CEA, CA199, CHROMGRNA in the last 8760 hours.  Assessment and Plan:  Charles [name] is a 84 y.o. male with past medical history significant for CAD, prostate cancer, sleep apnea (utilizes BiPAP machine), chronic back pain and osteoporosis who was in his baseline state of health until experiencing a fall from a seated position on the side of his bed in late June.  Lumbar spine radiographs performed at that time were negative for definitive  fracture (note, patient has history of left-sided L2-S1 unilateral L5 paraspinal fusion).  Unfortunately, the patient's pain persisted prompting repeat presentation to the emergency department on July 31 at which time a lumbar spine CT was obtained demonstrating acute bilateral sacral insufficiency fractures.    Selected images from lumbar spine CT performed 07/03/2018 was reviewed in detail.  Based on the imaging, as well as the patient's presentation, I feel the patient is an excellent candidate for fluoroscopic guided sacral cement augmentation.  As such, the risks and benefits of bilateral sacral cement augmentation was discussed with the patient and the patient's wife including, but not limited to education regarding the natural healing process of compression/insufficiency fractures without intervention, bleeding, infection, cement migration which may cause spinal cord damage, paralysis, pulmonary embolism or even death.  All of the patient's and the patient's wife's questions were answered, and the patient is agreeable to proceed.  As such, pending insurance approval, the procedure will be scheduled to be performed on an out patient basis either at [facility] (preferably based on patient preference) or [facility].  While the patient prefers that I performed the procedure, they are willing to have one of my capable interventional radiology partners perform the procedure if it means it would be scheduled in a more expeditious manner.  The patient was encouraged to call the interventional radiology clinic with any interval questions or concerns.  Thank you for this interesting consult.  I greatly enjoyed meeting YESHAYA VATH and look forward to participating in their care.  A copy of this report was sent to the requesting provider on this date.  Electronically Signed: Sandi Mariscal 07/10/2018, 4:55 PM   I spent a total of 30 Minutes in face to face in clinical consultation,  greater than 50% of which was counseling/coordinating care for sacral cement augmentation

## 2018-07-11 ENCOUNTER — Other Ambulatory Visit (HOSPITAL_COMMUNITY): Payer: Self-pay | Admitting: Interventional Radiology

## 2018-07-11 DIAGNOSIS — M8448XA Pathological fracture, other site, initial encounter for fracture: Secondary | ICD-10-CM

## 2018-07-12 ENCOUNTER — Other Ambulatory Visit: Payer: Self-pay | Admitting: Radiology

## 2018-07-12 ENCOUNTER — Telehealth: Payer: Self-pay | Admitting: *Deleted

## 2018-07-12 NOTE — Telephone Encounter (Signed)
PA for robaxin completed via cover my meds. Awaiting determination.

## 2018-07-12 NOTE — Telephone Encounter (Signed)
Rx approved

## 2018-07-15 ENCOUNTER — Ambulatory Visit (HOSPITAL_COMMUNITY)
Admission: RE | Admit: 2018-07-15 | Discharge: 2018-07-15 | Disposition: A | Discharge status: Home / Self Care | Payer: Medicare Other | Source: Ambulatory Visit | Attending: Interventional Radiology | Admitting: Interventional Radiology

## 2018-07-15 ENCOUNTER — Encounter (HOSPITAL_COMMUNITY): Payer: Self-pay

## 2018-07-15 DIAGNOSIS — I2510 Atherosclerotic heart disease of native coronary artery without angina pectoris: Secondary | ICD-10-CM | POA: Insufficient documentation

## 2018-07-15 DIAGNOSIS — Z981 Arthrodesis status: Secondary | ICD-10-CM | POA: Diagnosis not present

## 2018-07-15 DIAGNOSIS — Z923 Personal history of irradiation: Secondary | ICD-10-CM | POA: Diagnosis not present

## 2018-07-15 DIAGNOSIS — S3210XA Unspecified fracture of sacrum, initial encounter for closed fracture: Secondary | ICD-10-CM | POA: Diagnosis not present

## 2018-07-15 DIAGNOSIS — C61 Malignant neoplasm of prostate: Secondary | ICD-10-CM | POA: Diagnosis not present

## 2018-07-15 DIAGNOSIS — F329 Major depressive disorder, single episode, unspecified: Secondary | ICD-10-CM | POA: Insufficient documentation

## 2018-07-15 DIAGNOSIS — K219 Gastro-esophageal reflux disease without esophagitis: Secondary | ICD-10-CM | POA: Insufficient documentation

## 2018-07-15 DIAGNOSIS — E559 Vitamin D deficiency, unspecified: Secondary | ICD-10-CM | POA: Diagnosis not present

## 2018-07-15 DIAGNOSIS — N400 Enlarged prostate without lower urinary tract symptoms: Secondary | ICD-10-CM | POA: Insufficient documentation

## 2018-07-15 DIAGNOSIS — M8448XA Pathological fracture, other site, initial encounter for fracture: Secondary | ICD-10-CM | POA: Insufficient documentation

## 2018-07-15 DIAGNOSIS — M1990 Unspecified osteoarthritis, unspecified site: Secondary | ICD-10-CM | POA: Diagnosis not present

## 2018-07-15 DIAGNOSIS — G8929 Other chronic pain: Secondary | ICD-10-CM | POA: Insufficient documentation

## 2018-07-15 DIAGNOSIS — M810 Age-related osteoporosis without current pathological fracture: Secondary | ICD-10-CM | POA: Insufficient documentation

## 2018-07-15 DIAGNOSIS — M4858XA Collapsed vertebra, not elsewhere classified, sacral and sacrococcygeal region, initial encounter for fracture: Secondary | ICD-10-CM | POA: Diagnosis not present

## 2018-07-15 DIAGNOSIS — G4733 Obstructive sleep apnea (adult) (pediatric): Secondary | ICD-10-CM | POA: Diagnosis not present

## 2018-07-15 DIAGNOSIS — E785 Hyperlipidemia, unspecified: Secondary | ICD-10-CM | POA: Insufficient documentation

## 2018-07-15 DIAGNOSIS — M868X8 Other osteomyelitis, other site: Secondary | ICD-10-CM | POA: Diagnosis not present

## 2018-07-15 HISTORY — PX: IR SACROPLASTY BILATERAL: IMG5561

## 2018-07-15 LAB — CBC
HEMATOCRIT: 39.8 % (ref 39.0–52.0)
Hemoglobin: 12.6 g/dL — ABNORMAL LOW (ref 13.0–17.0)
MCH: 28.6 pg (ref 26.0–34.0)
MCHC: 31.7 g/dL (ref 30.0–36.0)
MCV: 90.2 fL (ref 78.0–100.0)
Platelets: 311 10*3/uL (ref 150–400)
RBC: 4.41 MIL/uL (ref 4.22–5.81)
RDW: 15.2 % (ref 11.5–15.5)
WBC: 8.4 10*3/uL (ref 4.0–10.5)

## 2018-07-15 LAB — BASIC METABOLIC PANEL
ANION GAP: 11 (ref 5–15)
BUN: 23 mg/dL (ref 8–23)
CALCIUM: 8.9 mg/dL (ref 8.9–10.3)
CO2: 22 mmol/L (ref 22–32)
Chloride: 100 mmol/L (ref 98–111)
Creatinine, Ser: 0.76 mg/dL (ref 0.61–1.24)
GFR calc Af Amer: >60 mL/min (ref >60)
GLUCOSE: 135 mg/dL — AB (ref 70–99)
POTASSIUM: 4.2 mmol/L (ref 3.5–5.1)
Sodium: 133 mmol/L — ABNORMAL LOW (ref 135–145)

## 2018-07-15 LAB — PROTIME-INR
INR: 1.1
PROTHROMBIN TIME: 14.2 s (ref 11.4–15.2)

## 2018-07-15 MED ORDER — CEFAZOLIN SODIUM-DEXTROSE 2-4 GM/100ML-% IV SOLN
2.0000 g | INTRAVENOUS | Status: AC
  Administered 2018-07-15: 2 g via INTRAVENOUS

## 2018-07-15 MED ORDER — LIDOCAINE HCL (PF) 1 % IJ SOLN
INTRAMUSCULAR | Status: AC
  Filled 2018-07-15: qty 30

## 2018-07-15 MED ORDER — MIDAZOLAM HCL 2 MG/2ML IJ SOLN
INTRAMUSCULAR | Status: AC
  Filled 2018-07-15: qty 2

## 2018-07-15 MED ORDER — FENTANYL CITRATE (PF) 100 MCG/2ML IJ SOLN
INTRAMUSCULAR | Status: AC | PRN
  Administered 2018-07-15: 12.5 ug via INTRAVENOUS
  Administered 2018-07-15: 25 ug via INTRAVENOUS

## 2018-07-15 MED ORDER — SODIUM CHLORIDE 0.9 % IV SOLN: INTRAVENOUS | Status: DC

## 2018-07-15 MED ORDER — FENTANYL CITRATE (PF) 100 MCG/2ML IJ SOLN
INTRAMUSCULAR | Status: AC
  Filled 2018-07-15: qty 2

## 2018-07-15 MED ORDER — CEFAZOLIN SODIUM-DEXTROSE 2-4 GM/100ML-% IV SOLN
INTRAVENOUS | Status: AC
  Administered 2018-07-15: 2 g via INTRAVENOUS
  Filled 2018-07-15: qty 100

## 2018-07-15 MED ORDER — MIDAZOLAM HCL 2 MG/2ML IJ SOLN
INTRAMUSCULAR | Status: AC | PRN
  Administered 2018-07-15: 1 mg via INTRAVENOUS

## 2018-07-15 NOTE — Discharge Instructions (Signed)
Percutaneous Vertebroplasty (Sacroplasty), Care After These instructions give you information on caring for yourself after your procedure. Your doctor may also give you more specific instructions. Call your doctor if you have any problems or questions after your procedure. Follow these instructions at home:  Take medicine as told by your doctor.  Keep your wound dry and covered for 24 hours or as told by your doctor.  Ask your doctor when you can bathe or shower.  Put an ice pack on your wound. ? Put ice in a plastic bag. ? Place a towel between your skin and the bag. ? Leave the ice on for 15-20 minutes, 3-4 times a day.  Rest in your bed for 24 hours or as told by your doctor.  Return to normal activities as told by your doctor.  Ask your doctor what stretches and exercises you can do.  Do not bend or lift anything heavy as told by your doctor. Contact a doctor if:  Your wound becomes red, puffy (swollen), or tender to the touch.  You are bleeding or leaking fluid from the wound.  You are sick to your stomach (nauseous) or throw up (vomit) for more than 24 hours after the procedure.  Your back pain does not get better.  You have a fever. Get help right away if:  You have bad back pain that comes on suddenly.  You cannot control when you pee (urinate) or poop (bowel movement).  You lose feeling (numbness) or have tingling in your legs or feet, or they become weak.  You have sudden weakness in your arms or legs.  You have shooting pain down your legs.  You have chest pain or a hard time breathing.  You feel dizzy or pass out (faint).  Your vision changes or you cannot talk as you normally do. This information is not intended to replace advice given to you by your health care provider. Make sure you discuss any questions you have with your health care provider. Document Released: 02/14/2010 Document Revised: 04/27/2016 Document Reviewed: 07/29/2013 Elsevier  Interactive Patient Education  2018 [place]. Moderate Conscious Sedation, Adult, Care After These instructions provide you with information about caring for yourself after your procedure. Your health care provider may also give you more specific instructions. Your treatment has been planned according to current medical practices, but problems sometimes occur. Call your health care provider if you have any problems or questions after your procedure. What can I expect after the procedure? After your procedure, it is common:  To feel sleepy for several hours.  To feel clumsy and have poor balance for several hours.  To have poor judgment for several hours.  To vomit if you eat too soon.  Follow these instructions at home: For at least 24 hours after the procedure:   Do not: ? Participate in activities where you could fall or become injured. ? Drive. ? Use heavy machinery. ? Drink alcohol. ? Take sleeping pills or medicines that cause drowsiness. ? Make important decisions or sign legal documents. ? Take care of children on your own.  Rest. Eating and drinking  Follow the diet recommended by your health care provider.  If you vomit: ? Drink water, juice, or soup when you can drink without vomiting. ? Make sure you have little or no nausea before eating solid foods. General instructions  Have a responsible adult stay with you until you are awake and alert.  Take over-the-counter and prescription medicines only as told by  your health care provider.  If you smoke, do not smoke without supervision.  Keep all follow-up visits as told by your health care provider. This is important. Contact a health care provider if:  You keep feeling nauseous or you keep vomiting.  You feel light-headed.  You develop a rash.  You have a fever. Get help right away if:  You have trouble breathing. This information is not intended to replace advice given to you by your health care  provider. Make sure you discuss any questions you have with your health care provider. Document Released: 09/10/2013 Document Revised: 04/24/2016 Document Reviewed: 03/11/2016 Elsevier Interactive Patient Education  [employer].

## 2018-07-15 NOTE — H&P (Signed)
Chief Complaint: Patient was seen in consultation today for Bilateral sacraoplasty at the request of Dr Ulyess Mort    Supervising Physician: Corrie Mckusick  Patient Status: [facility] - Out-pt  History of Present Illness: Charles [name] is a 84 y.o. male   CAD; Prostate Ca; OSA; chronic back pain- osteoporosis (hx 2 or more back surgeries) Lives at [facility] at home in June Persistent pain Unable to even roll in bed Evaluation and Xrays were neg for fracture Revisit to ED July 31 CT:  IMPRESSION: 1. Vertical and transverse sacral insufficiency fractures that are likely subacute. 2. Fracture through L5-S1 right posterior-lateral arthrodesis at the level of the transverse processes. 3. L2-S1 fusion. 4. Severe L1-2 adjacent segment disc degeneration. 5. Transitional S1 vertebra  Pain meds without much relief Unable to even get out of bed for 2 weeks Transfer only for mobilization  Seen in consultation with Dr Pascal Lux 07/10/18: Based on the imaging, as well as the patient's presentation, I feel the patient is an excellent candidate for fluoroscopic guided sacral cement augmentation   Past Medical History:  Diagnosis Date  . Anemia   . Arthritis   . AVM (arteriovenous malformation)   . Barrett's esophagus   . BPH (benign prostatic hyperplasia)   . CAD (coronary artery disease)    only on stress test; never had MI, PCI or CABG.  In 2008, LVEF was reportedly normal. pt. denies  . Cataract   . Constipation   . CPAP (continuous positive airway pressure) dependence   . Depression   . Diverticulosis    diverticular bleeding  . Diverticulosis   . GERD (gastroesophageal reflux disease)   . History of radiation therapy   . Hyperlipidemia   . Hypoglycemia   . Lymphocytosis 06/2012  . Mastodynia   . Murmur    slight  . Nasal congestion   . Osteoporosis   . Osteoporosis   . Prostate cancer ([place])   . Sleep apnea    wears CPAP  . Urinary retention with  incomplete bladder emptying    pt uses bathroom and within 30 mins needs to go again  . Vitamin D deficiency     Past Surgical History:  Procedure Laterality Date  . AMPUTATION TOE Right 09/20/2017   Procedure: Right 4th toe amputation;  Surgeon: Wylene Simmer, MD;  Location: [place];  Service: Orthopedics;  Laterality: Right;  foot block  . BACK SURGERY  04/2013  . back surgury     f  . BLEPHAROPLASTY Bilateral 02/23/15  . CATARACT EXTRACTION    . COLONOSCOPY    . COLONOSCOPY N/A 10/02/2017   Procedure: COLONOSCOPY;  Surgeon: Doran Stabler, MD;  Location: [facility];  Service: Gastroenterology;  Laterality: N/A;  . COLONOSCOPY WITH PROPOFOL N/A 10/01/2017   Procedure: COLONOSCOPY WITH PROPOFOL;  Surgeon: Doran Stabler, MD;  Location: [facility];  Service: Gastroenterology;  Laterality: N/A;  . COLONOSCOPY WITH PROPOFOL N/A 03/07/2018   Procedure: COLONOSCOPY WITH PROPOFOL;  Surgeon: [name], MD;  Location: [facility];  Service: Endoscopy;  Laterality: N/A;  . ESOPHAGOGASTRODUODENOSCOPY (EGD) WITH PROPOFOL N/A 10/01/2017   Procedure: ESOPHAGOGASTRODUODENOSCOPY (EGD) WITH PROPOFOL;  Surgeon: Doran Stabler, MD;  Location: [facility];  Service: Gastroenterology;  Laterality: N/A;  . EYE SURGERY Bilateral   . FINGER SURGERY Right    right thumb  . HARDWARE REMOVAL Right 05/15/2016   Procedure: Right Lumbar Two-Lumbar Five Removal of Hardware;  Surgeon:  Kristeen Miss, MD;  Location: [facility];  Service: Neurosurgery;  Laterality: Right;  Right L2-5 removal of hardware  . INGUINAL HERNIA REPAIR     x4  . IR RADIOLOGIST EVAL & MGMT  07/10/2018  . REPAIR SPIGELIAN HERNIA  2011  . [place] ,2014  . TONSILLECTOMY    . UPPER GASTROINTESTINAL ENDOSCOPY    . VASECTOMY      Allergies: Celebrex [celecoxib] and Flexeril [cyclobenzaprine]  Medications: Prior to Admission medications   Medication Sig Start Date End Date Taking?  Authorizing Provider  Amino Acids-Protein Hydrolys (FEEDING SUPPLEMENT, PRO-STAT SUGAR FREE 64,) LIQD Take 30 mLs by mouth daily.    [provider]  buPROPion (WELLBUTRIN SR) 150 MG 12 hr tablet Take 150 mg by mouth 2 (two) times daily.  06/28/11   [provider]  carboxymethylcellul-glycerin (REFRESH REPAIR) 0.5-0.9 % ophthalmic solution Place 1 drop into both eyes 3 (three) times daily.    [provider]  cholecalciferol (VITAMIN D) 1000 UNITS tablet Take 1,000 Units by mouth daily.    [provider]  docusate sodium (COLACE) 100 MG capsule Take 100 mg by mouth daily as needed for mild constipation.     [provider]  ferrous sulfate 325 (65 FE) MG EC tablet Take 325 mg by mouth daily with breakfast.    [provider]  gabapentin (NEURONTIN) 300 MG capsule Take 1 tablet PO at 4 PM  and 1 tablet at 10 PM 06/20/16   Ward Givens, NP  HYDROcodone-acetaminophen (NORCO/VICODIN) 5-325 MG tablet Take 1 tablet by mouth every 6 (six) hours as needed for moderate pain.    [provider]  ipratropium (ATROVENT) 0.03 % nasal spray Place 2 sprays into both nostrils 2 (two) times daily.     [provider]  lovastatin (MEVACOR) 20 MG tablet Take 20 mg by mouth at bedtime.  07/22/12   [provider]  Magnesium 250 MG TABS Take 250 mg by mouth daily.     [provider]  methocarbamol (ROBAXIN) 500 MG tablet Take 500 mg by mouth every 6 (six) hours as needed for muscle spasms.     [provider]  Multiple Minerals-Vitamins (CITRACAL PLUS BONE DENSITY) TABS Take 1 tablet by mouth 2 (two) times daily.     [provider]  Multiple Vitamins-Minerals (CENTRUM ADULTS PO) Take 1 tablet by mouth daily.    [provider]  Multiple Vitamins-Minerals (PRESERVISION AREDS 2 PO) Take 1 capsule by mouth 2 (two) times daily.    [provider]  omeprazole (PRILOSEC) 20 MG capsule Take 20 mg by  mouth 2 (two) times daily.    [provider]  Polyethylene Glycol 3350 (MIRALAX PO) Take 1 Scoop by mouth daily.     [provider]  propranolol (INDERAL) 10 MG tablet Take 10 mg by mouth daily.     [provider]  ranitidine (ZANTAC) 300 MG tablet TAKE 1 TABLET(300 MG) BY MOUTH AT BEDTIME Patient taking differently: Take 300 mg by mouth at bedtime.  02/25/18   [name], MD  rOPINIRole (REQUIP) 4 MG tablet Take 4 mg by mouth at bedtime.    [provider]  Tamsulosin HCl (FLOMAX) 0.4 MG CAPS Take 0.4 mg by mouth 2 (two) times daily.  08/30/11   [provider]  traMADol (ULTRAM) 50 MG tablet Take 100 mg by mouth 2 (two) times daily as needed.    [provider]  Family History  Problem Relation Age of Onset  . Prostate cancer Father   . CAD Mother   . Arthritis-Osteo Brother   . Colon cancer Neg Hx   . Stomach cancer Neg Hx     Social History   Socioeconomic History  . Marital status: Married    Spouse name: [name]  . Number of children: 3  . Years of education: College  . Highest education level: Not on file  Occupational History  . 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 Financial resource strain: Not on file  . Food insecurity:    Worry: Not on file    Inability: Not on file  . Transportation needs:    Medical: Not on file    Non-medical: Not on file  Tobacco Use  . Smoking status: Never Smoker  . Smokeless tobacco: Never Used  Substance and Sexual Activity  . Alcohol use: No  . Drug use: No  . Sexual activity: Never  Lifestyle  . Physical activity:    Days per week: Not on file    Minutes per session: Not on file  . Stress: Not on file  Relationships  . Social connections:    Talks on phone: Not on file    Gets together: Not on file    Attends religious service: Not on file    Active member of club or organization: Not on file    Attends  meetings of clubs or organizations: Not on file    Relationship status: Not on file  Other Topics Concern  . Not on file  Social History Narrative   Patient is married [name]).   Patient is a retired Arboriculturist.   Patient lives in [facility] facility.   Patient has three adult children.   Patient does not drink any caffeine.   Patient is right-handed.    Review of Systems: A 12 point ROS discussed and pertinent positives are indicated in the HPI above.  All other systems are negative.  Review of Systems  Constitutional: Positive for unexpected weight change.       Weak; frail; ill appearing   Respiratory: Negative for cough and shortness of breath.   Cardiovascular: Negative for chest pain.  Gastrointestinal: Negative for abdominal pain.  Musculoskeletal: Positive for back pain and gait problem.  Neurological: Positive for weakness.  Psychiatric/Behavioral: Negative for behavioral problems and confusion.    Vital Signs: BP 139/63   Pulse 67   Temp 97.8 F (36.6 C) (Oral)   Resp 16   Ht 5\' 6"  (1.676 m)   Wt 160 lb (72.6 kg)   SpO2 97%   BMI 25.82 kg/m   Physical Exam  Constitutional: He is oriented to person, place, and time.  Cardiovascular: Normal rate and regular rhythm.  Murmur heard. Pulmonary/Chest: Effort normal and breath sounds normal.  Abdominal: Soft. Bowel sounds are normal.  Musculoskeletal: Normal range of motion.  Moves all 4s Weak Low back pain   Neurological: He is alert and oriented to person, place, and time.  Skin: Skin is warm and dry.  Psychiatric: He has a normal mood and affect. His behavior is normal. Judgment and thought content normal.  Vitals reviewed.   Imaging: Ct Lumbar Spine Wo Contrast  Result Date: 07/03/2018 CLINICAL DATA:  Fall 6 weeks ago with negative x-rays. Worsening for 2 days EXAM: CT LUMBAR SPINE WITHOUT CONTRAST TECHNIQUE: Multidetector CT imaging of the lumbar spine was performed  without intravenous contrast  administration. Multiplanar CT image reconstructions were also generated. COMPARISON:  01/10/2017 FINDINGS: Segmentation: Transitional S1 vertebra with lumbarization. The same numbering scheme was used on prior. Alignment: Fused anterolisthesis at L5-S1. Reversal of lordosis in the upper lumbar spine. Vertebrae: Bilateral vertical sacral ala fractures extending to the anterior sacral foramina without visible narrowing. There is also transverse component at the S3 level. This shows patchy heterogeneous sclerosis compatible with subacute timing. Related likely subacute fracture through right posterior-lateral arthrodesis at L5 and S1, lateral to the pedicles and at the level of transverse processes. Endplate erosions at X4-4, with sclerosis, is chronic and degenerative when compared to prior. Paraspinal and other soft tissues: Postoperative scarring dorsally. There is presacral edema related to fractures. Hiatal hernia. Disc levels: T12- L1: Unremarkable. L1-L2: Severe disc disease with endplate degeneration, disc collapse, and vacuum phenomenon. Bilateral foraminal narrowing. L2-L3: PLIF with solid arthrodesis. L3-L4: PLIF with solid arthrodesis.  Broad laminectomy. L4-L5: PLIF with solid arthrodesis.  Broad laminectomy. L5-S1:PLIF with fused anterolisthesis. There is right more than left foraminal narrowing from disc height loss. IMPRESSION: 1. Vertical and transverse sacral insufficiency fractures that are likely subacute. 2. Fracture through L5-S1 right posterior-lateral arthrodesis at the level of the transverse processes. 3. L2-S1 fusion. 4. Severe L1-2 adjacent segment disc degeneration. 5. Transitional S1 vertebra. Electronically Signed   By: Monte Fantasia M.D.   On: 07/03/2018 11:19   Ir Radiologist Eval & Mgmt  Result Date: 07/10/2018 Please refer to notes tab for details about interventional procedure. (Op Note)   Labs:  CBC: Recent Labs    04/30/18 05/17/18 1524 05/30/18 07/08/18  07/15/18 0704  WBC 5.4 7.3 6.9 7.8 8.4  HGB 11.9* 12.0* 11.9* 12.0 12.6*  HCT 38* 35.8* 36* 35.5 39.8  PLT 193 197.0 312  --  311    COAGS: Recent Labs    09/29/17 1941 10/03/17 2038  INR 1.07 0.95    BMP: Recent Labs    09/29/17 1941 09/30/17 0304 10/01/17 0525 10/03/17 2130 12/21/17 03/12/18 05/30/18 07/08/18  NA 139 137 144 140 139 138 138 132  K 4.0 3.8 3.7 3.8 4.5 4.9 4.3 4.4  CL 108 107 112* 109  --   --  104  --   CO2 23 24 23 23   --   --  26  --   GLUCOSE 117* 104* 100* 93  --   --   --   --   BUN 25* 21* 13 13 20 19  22* 22*  CALCIUM 9.1 8.7* 8.9 9.2  --   --  8.6 8.4  CREATININE 0.99 0.88 0.86 0.84 0.9 0.8 0.8 0.65  GFRNONAA >60 >60 >60 >60  --   --  83 89  GFRAA >60 >60 >60 >60  --   --   --   --     LIVER FUNCTION TESTS: Recent Labs    09/29/17 1941 10/01/17 0525 05/30/18 07/08/18  BILITOT 0.4 0.7  --  0.5  AST 53* 51* 32 102  ALT 23 24 16  67  ALKPHOS 76 62 112 122  PROT 6.8 6.9 6.2 6.3  ALBUMIN 3.8 3.9 3.3 3.3    TUMOR MARKERS: No results for input(s): AFPTM, CEA, CA199, CHROMGRNA in the last 8760 hours.  Assessment and Plan:  Painful Bilateral sacral fractures Scheduled for sacroplasty today Risks and benefits of Bilateral sacroplasty were discussed with the patient including, but not limited to education regarding the natural healing process of compression fractures without intervention, bleeding,  infection, cement migration which may cause spinal cord damage, paralysis, pulmonary embolism or even death.  This interventional procedure involves the use of X-rays and because of the nature of the planned procedure, it is possible that we will have prolonged use of X-ray fluoroscopy.  Potential radiation risks to you include (but are not limited to) the following: - A slightly elevated risk for cancer  several years later in life. This risk is typically less than 0.5% percent. This risk is low in comparison to the normal incidence of  human cancer, which is 33% for women and 50% for men according to the [employer]. - Radiation induced injury can include skin redness, resembling a rash, tissue breakdown / ulcers and hair loss (which can be temporary or permanent).   The likelihood of either of these occurring depends on the difficulty of the procedure and whether you are sensitive to radiation due to previous procedures, disease, or genetic conditions.   IF your procedure requires a prolonged use of radiation, you will be notified and given written instructions for further action.  It is your responsibility to monitor the irradiated area for the 2 weeks following the procedure and to notify your physician if you are concerned that you have suffered a radiation induced injury.    All of the patient's questions were answered, patient is agreeable to proceed.  Consent signed and in chart.  Thank you for this interesting consult.  I greatly enjoyed meeting LAWRNCE REYEZ and look forward to participating in their care.  A copy of this report was sent to the requesting provider on this date.  Electronically Signed: Lavonia Drafts, PA-C 07/15/2018, 7:49 AM   I spent a total of  30 Minutes   in face to face in clinical consultation, greater than 50% of which was counseling/coordinating care for Sacroplasty

## 2018-07-15 NOTE — Procedures (Signed)
Interventional Radiology Procedure Note  Procedure: Bilateral sacral augmentation.  Complications: None Recommendations:  - supine position for 3 hours, before DC home - Ok to shower tomorrow - Do not submerge for 7 days - Routine care - DC 3 hours - advance diet   Signed,  Dulcy Fanny. Earleen Newport, DO

## 2018-07-15 NOTE — Progress Notes (Signed)
Report given to Crystal at [facility]

## 2018-07-16 ENCOUNTER — Other Ambulatory Visit: Payer: Self-pay | Admitting: Interventional Radiology

## 2018-07-16 DIAGNOSIS — M8448XA Pathological fracture, other site, initial encounter for fracture: Secondary | ICD-10-CM

## 2018-07-17 DIAGNOSIS — R030 Elevated blood-pressure reading, without diagnosis of hypertension: Secondary | ICD-10-CM | POA: Diagnosis not present

## 2018-07-17 DIAGNOSIS — Z6825 Body mass index (BMI) 25.0-25.9, adult: Secondary | ICD-10-CM | POA: Diagnosis not present

## 2018-07-17 DIAGNOSIS — M25511 Pain in right shoulder: Secondary | ICD-10-CM | POA: Diagnosis not present

## 2018-07-18 DIAGNOSIS — N400 Enlarged prostate without lower urinary tract symptoms: Secondary | ICD-10-CM | POA: Diagnosis not present

## 2018-07-18 DIAGNOSIS — M6281 Muscle weakness (generalized): Secondary | ICD-10-CM | POA: Diagnosis not present

## 2018-07-18 LAB — BASIC METABOLIC PANEL
BUN: 18 (ref 4–21)
Calcium: 8.2
Creat: 0.52
GLUCOSE: 99
Potassium: 4.4
SODIUM: 135

## 2018-07-19 ENCOUNTER — Encounter: Payer: Self-pay | Admitting: *Deleted

## 2018-07-19 DIAGNOSIS — Q2733 Arteriovenous malformation of digestive system vessel: Secondary | ICD-10-CM | POA: Diagnosis not present

## 2018-07-19 DIAGNOSIS — K5731 Diverticulosis of large intestine without perforation or abscess with bleeding: Secondary | ICD-10-CM | POA: Diagnosis not present

## 2018-07-19 DIAGNOSIS — Z9989 Dependence on other enabling machines and devices: Secondary | ICD-10-CM | POA: Diagnosis not present

## 2018-07-19 DIAGNOSIS — K627 Radiation proctitis: Secondary | ICD-10-CM | POA: Diagnosis not present

## 2018-07-19 DIAGNOSIS — G4733 Obstructive sleep apnea (adult) (pediatric): Secondary | ICD-10-CM | POA: Diagnosis not present

## 2018-07-19 DIAGNOSIS — R29898 Other symptoms and signs involving the musculoskeletal system: Secondary | ICD-10-CM | POA: Diagnosis not present

## 2018-07-19 DIAGNOSIS — E559 Vitamin D deficiency, unspecified: Secondary | ICD-10-CM | POA: Diagnosis not present

## 2018-07-19 DIAGNOSIS — Z89421 Acquired absence of other right toe(s): Secondary | ICD-10-CM | POA: Diagnosis not present

## 2018-07-19 DIAGNOSIS — Z8546 Personal history of malignant neoplasm of prostate: Secondary | ICD-10-CM | POA: Diagnosis not present

## 2018-07-19 DIAGNOSIS — F329 Major depressive disorder, single episode, unspecified: Secondary | ICD-10-CM | POA: Diagnosis not present

## 2018-07-19 DIAGNOSIS — N400 Enlarged prostate without lower urinary tract symptoms: Secondary | ICD-10-CM | POA: Diagnosis not present

## 2018-07-19 DIAGNOSIS — E785 Hyperlipidemia, unspecified: Secondary | ICD-10-CM | POA: Diagnosis not present

## 2018-07-19 DIAGNOSIS — Z9181 History of falling: Secondary | ICD-10-CM | POA: Diagnosis not present

## 2018-07-19 DIAGNOSIS — K5900 Constipation, unspecified: Secondary | ICD-10-CM | POA: Diagnosis not present

## 2018-07-19 DIAGNOSIS — R339 Retention of urine, unspecified: Secondary | ICD-10-CM | POA: Diagnosis not present

## 2018-07-19 DIAGNOSIS — G2581 Restless legs syndrome: Secondary | ICD-10-CM | POA: Diagnosis not present

## 2018-07-19 DIAGNOSIS — I10 Essential (primary) hypertension: Secondary | ICD-10-CM | POA: Diagnosis not present

## 2018-07-19 DIAGNOSIS — M545 Low back pain: Secondary | ICD-10-CM | POA: Diagnosis not present

## 2018-07-19 DIAGNOSIS — G8929 Other chronic pain: Secondary | ICD-10-CM | POA: Diagnosis not present

## 2018-07-19 DIAGNOSIS — M6281 Muscle weakness (generalized): Secondary | ICD-10-CM | POA: Diagnosis not present

## 2018-07-20 DIAGNOSIS — M6281 Muscle weakness (generalized): Secondary | ICD-10-CM | POA: Diagnosis not present

## 2018-07-20 DIAGNOSIS — R29898 Other symptoms and signs involving the musculoskeletal system: Secondary | ICD-10-CM | POA: Diagnosis not present

## 2018-07-20 DIAGNOSIS — F329 Major depressive disorder, single episode, unspecified: Secondary | ICD-10-CM | POA: Diagnosis not present

## 2018-07-20 DIAGNOSIS — M545 Low back pain: Secondary | ICD-10-CM | POA: Diagnosis not present

## 2018-07-20 DIAGNOSIS — G4733 Obstructive sleep apnea (adult) (pediatric): Secondary | ICD-10-CM | POA: Diagnosis not present

## 2018-07-20 DIAGNOSIS — R339 Retention of urine, unspecified: Secondary | ICD-10-CM | POA: Diagnosis not present

## 2018-07-22 DIAGNOSIS — M545 Low back pain: Secondary | ICD-10-CM | POA: Diagnosis not present

## 2018-07-22 DIAGNOSIS — G4733 Obstructive sleep apnea (adult) (pediatric): Secondary | ICD-10-CM | POA: Diagnosis not present

## 2018-07-22 DIAGNOSIS — L8990 Pressure ulcer of unspecified site, unspecified stage: Secondary | ICD-10-CM | POA: Diagnosis not present

## 2018-07-22 DIAGNOSIS — F329 Major depressive disorder, single episode, unspecified: Secondary | ICD-10-CM | POA: Diagnosis not present

## 2018-07-22 DIAGNOSIS — R29898 Other symptoms and signs involving the musculoskeletal system: Secondary | ICD-10-CM | POA: Diagnosis not present

## 2018-07-22 DIAGNOSIS — M6281 Muscle weakness (generalized): Secondary | ICD-10-CM | POA: Diagnosis not present

## 2018-07-22 DIAGNOSIS — R339 Retention of urine, unspecified: Secondary | ICD-10-CM | POA: Diagnosis not present

## 2018-07-22 LAB — TESTOSTERONE: TESTOSTERONE: 163

## 2018-07-22 LAB — PSA: PSA, Total: 0.1

## 2018-07-23 ENCOUNTER — Encounter: Payer: Self-pay | Admitting: *Deleted

## 2018-07-23 DIAGNOSIS — G4733 Obstructive sleep apnea (adult) (pediatric): Secondary | ICD-10-CM | POA: Diagnosis not present

## 2018-07-23 DIAGNOSIS — M6281 Muscle weakness (generalized): Secondary | ICD-10-CM | POA: Diagnosis not present

## 2018-07-23 DIAGNOSIS — R29898 Other symptoms and signs involving the musculoskeletal system: Secondary | ICD-10-CM | POA: Diagnosis not present

## 2018-07-23 DIAGNOSIS — F329 Major depressive disorder, single episode, unspecified: Secondary | ICD-10-CM | POA: Diagnosis not present

## 2018-07-23 DIAGNOSIS — R339 Retention of urine, unspecified: Secondary | ICD-10-CM | POA: Diagnosis not present

## 2018-07-23 DIAGNOSIS — M545 Low back pain: Secondary | ICD-10-CM | POA: Diagnosis not present

## 2018-07-25 DIAGNOSIS — R29898 Other symptoms and signs involving the musculoskeletal system: Secondary | ICD-10-CM | POA: Diagnosis not present

## 2018-07-25 DIAGNOSIS — M545 Low back pain: Secondary | ICD-10-CM | POA: Diagnosis not present

## 2018-07-25 DIAGNOSIS — R339 Retention of urine, unspecified: Secondary | ICD-10-CM | POA: Diagnosis not present

## 2018-07-25 DIAGNOSIS — M6281 Muscle weakness (generalized): Secondary | ICD-10-CM | POA: Diagnosis not present

## 2018-07-25 DIAGNOSIS — F329 Major depressive disorder, single episode, unspecified: Secondary | ICD-10-CM | POA: Diagnosis not present

## 2018-07-25 DIAGNOSIS — G4733 Obstructive sleep apnea (adult) (pediatric): Secondary | ICD-10-CM | POA: Diagnosis not present

## 2018-07-29 DIAGNOSIS — F329 Major depressive disorder, single episode, unspecified: Secondary | ICD-10-CM | POA: Diagnosis not present

## 2018-07-29 DIAGNOSIS — R29898 Other symptoms and signs involving the musculoskeletal system: Secondary | ICD-10-CM | POA: Diagnosis not present

## 2018-07-29 DIAGNOSIS — G4733 Obstructive sleep apnea (adult) (pediatric): Secondary | ICD-10-CM | POA: Diagnosis not present

## 2018-07-29 DIAGNOSIS — M545 Low back pain: Secondary | ICD-10-CM | POA: Diagnosis not present

## 2018-07-29 DIAGNOSIS — M6281 Muscle weakness (generalized): Secondary | ICD-10-CM | POA: Diagnosis not present

## 2018-07-29 DIAGNOSIS — R339 Retention of urine, unspecified: Secondary | ICD-10-CM | POA: Diagnosis not present

## 2018-07-30 ENCOUNTER — Ambulatory Visit (HOSPITAL_COMMUNITY): Admit: 2018-07-30 | Payer: Medicare Other | Admitting: Gastroenterology

## 2018-07-30 ENCOUNTER — Encounter (HOSPITAL_COMMUNITY): Payer: Self-pay

## 2018-07-30 DIAGNOSIS — M6281 Muscle weakness (generalized): Secondary | ICD-10-CM | POA: Diagnosis not present

## 2018-07-30 DIAGNOSIS — R29898 Other symptoms and signs involving the musculoskeletal system: Secondary | ICD-10-CM | POA: Diagnosis not present

## 2018-07-30 DIAGNOSIS — G4733 Obstructive sleep apnea (adult) (pediatric): Secondary | ICD-10-CM | POA: Diagnosis not present

## 2018-07-30 DIAGNOSIS — M545 Low back pain: Secondary | ICD-10-CM | POA: Diagnosis not present

## 2018-07-30 DIAGNOSIS — R339 Retention of urine, unspecified: Secondary | ICD-10-CM | POA: Diagnosis not present

## 2018-07-30 DIAGNOSIS — C61 Malignant neoplasm of prostate: Secondary | ICD-10-CM | POA: Diagnosis not present

## 2018-07-30 DIAGNOSIS — N401 Enlarged prostate with lower urinary tract symptoms: Secondary | ICD-10-CM | POA: Diagnosis not present

## 2018-07-30 DIAGNOSIS — R3914 Feeling of incomplete bladder emptying: Secondary | ICD-10-CM | POA: Diagnosis not present

## 2018-07-30 DIAGNOSIS — F329 Major depressive disorder, single episode, unspecified: Secondary | ICD-10-CM | POA: Diagnosis not present

## 2018-07-30 SURGERY — SIGMOIDOSCOPY, FLEXIBLE
Anesthesia: Monitor Anesthesia Care

## 2018-07-31 DIAGNOSIS — G4733 Obstructive sleep apnea (adult) (pediatric): Secondary | ICD-10-CM | POA: Diagnosis not present

## 2018-07-31 DIAGNOSIS — R29898 Other symptoms and signs involving the musculoskeletal system: Secondary | ICD-10-CM | POA: Diagnosis not present

## 2018-07-31 DIAGNOSIS — R339 Retention of urine, unspecified: Secondary | ICD-10-CM | POA: Diagnosis not present

## 2018-07-31 DIAGNOSIS — F329 Major depressive disorder, single episode, unspecified: Secondary | ICD-10-CM | POA: Diagnosis not present

## 2018-07-31 DIAGNOSIS — M545 Low back pain: Secondary | ICD-10-CM | POA: Diagnosis not present

## 2018-07-31 DIAGNOSIS — M6281 Muscle weakness (generalized): Secondary | ICD-10-CM | POA: Diagnosis not present

## 2018-08-01 DIAGNOSIS — R29898 Other symptoms and signs involving the musculoskeletal system: Secondary | ICD-10-CM | POA: Diagnosis not present

## 2018-08-01 DIAGNOSIS — F329 Major depressive disorder, single episode, unspecified: Secondary | ICD-10-CM | POA: Diagnosis not present

## 2018-08-01 DIAGNOSIS — M545 Low back pain: Secondary | ICD-10-CM | POA: Diagnosis not present

## 2018-08-01 DIAGNOSIS — G4733 Obstructive sleep apnea (adult) (pediatric): Secondary | ICD-10-CM | POA: Diagnosis not present

## 2018-08-01 DIAGNOSIS — M6281 Muscle weakness (generalized): Secondary | ICD-10-CM | POA: Diagnosis not present

## 2018-08-01 DIAGNOSIS — R339 Retention of urine, unspecified: Secondary | ICD-10-CM | POA: Diagnosis not present

## 2018-08-02 ENCOUNTER — Encounter: Payer: Self-pay | Admitting: Family

## 2018-08-02 ENCOUNTER — Non-Acute Institutional Stay (SKILLED_NURSING_FACILITY): Payer: Medicare Other | Admitting: Family

## 2018-08-02 DIAGNOSIS — R310 Gross hematuria: Secondary | ICD-10-CM | POA: Diagnosis not present

## 2018-08-02 NOTE — Progress Notes (Signed)
Location:  [facility] Room Number: 24 Place of Service:  SNF [phone]) Provider: Adaora Mchaney FNP-C  Leanna Battles, MD  Patient Care Team: Leanna Battles, MD as PCP - General (Internal Medicine) [name], MD (Hematology and Oncology) [name], MD as Attending Physician (Urology) [name], MD (Gastroenterology)  Extended Emergency Contact Information Primary Emergency Contact: Tellez,Janet H Address: [address]          [address]          [place], [place] [address] [name] of [place] Phone: [phone] Mobile Phone: [phone] Relation: Spouse  Code Status:  Full Code  Goals of care: Advanced Directive information Advanced Directives 08/02/2018  Does Patient Have a Medical Advance Directive? Yes  Type of Paramedic of [place];Living will  Does patient want to make changes to medical advance directive? -  Copy of [place] in Chart? Yes  Would patient like information on creating a medical advance directive? -     Chief Complaint  Patient presents with  . Acute Visit    Hematuria and fall    HPI:  Pt is a 84 y.o. male seen today at [facility] for an acute visit for evaluation of blood in the urine and fall episode.He is seen in the room with facility Nurse at bedside.Nurse reported patient was observed on the floor.he states did not know where he was a the time but later remembered he must have rolled out of the bed.He any pain or hitting his head on the floor.He also denies any fever,chills or cough.He does complain of stinging sensation on his penis at the foley cathter insertion site. Nurse states patient urine has been intermittently bloody.No clots observed.    Past Medical History:  Diagnosis Date  . Anemia   . Arthritis   . AVM (arteriovenous malformation)   . Barrett's esophagus   . BPH (benign prostatic hyperplasia)   . CAD (coronary artery  disease)    only on stress test; never had MI, PCI or CABG.  In 2008, LVEF was reportedly normal. pt. denies  . Cataract   . Constipation   . CPAP (continuous positive airway pressure) dependence   . Depression   . Diverticulosis    diverticular bleeding  . Diverticulosis   . GERD (gastroesophageal reflux disease)   . History of radiation therapy   . Hyperlipidemia   . Hypoglycemia   . Lymphocytosis 06/2012  . Mastodynia   . Murmur    slight  . Nasal congestion   . Osteoporosis   . Osteoporosis   . Prostate cancer ([place])   . Sleep apnea    wears CPAP  . Urinary retention with incomplete bladder emptying    pt uses bathroom and within 30 mins needs to go again  . Vitamin D deficiency    Past Surgical History:  Procedure Laterality Date  . AMPUTATION TOE Right 09/20/2017   Procedure: Right 4th toe amputation;  Surgeon: Wylene Simmer, MD;  Location: [facility];  Service: Orthopedics;  Laterality: Right;  foot block  . BACK SURGERY  04/2013  . back surgury     f  . BLEPHAROPLASTY Bilateral 02/23/15  . CATARACT EXTRACTION    . COLONOSCOPY    . COLONOSCOPY N/A 10/02/2017   Procedure: COLONOSCOPY;  Surgeon: Doran Stabler, MD;  Location: [facility];  Service: Gastroenterology;  Laterality: N/A;  . COLONOSCOPY WITH PROPOFOL N/A 10/01/2017  Procedure: COLONOSCOPY WITH PROPOFOL;  Surgeon: Doran Stabler, MD;  Location: [facility];  Service: Gastroenterology;  Laterality: N/A;  . COLONOSCOPY WITH PROPOFOL N/A 03/07/2018   Procedure: COLONOSCOPY WITH PROPOFOL;  Surgeon: [name], MD;  Location: [facility];  Service: Endoscopy;  Laterality: N/A;  . ESOPHAGOGASTRODUODENOSCOPY (EGD) WITH PROPOFOL N/A 10/01/2017   Procedure: ESOPHAGOGASTRODUODENOSCOPY (EGD) WITH PROPOFOL;  Surgeon: Doran Stabler, MD;  Location: [facility];  Service: Gastroenterology;  Laterality: N/A;  . EYE SURGERY Bilateral   . FINGER SURGERY Right    right thumb  . HARDWARE  REMOVAL Right 05/15/2016   Procedure: Right Lumbar Two-Lumbar Five Removal of Hardware;  Surgeon: Kristeen Miss, MD;  Location: [facility];  Service: Neurosurgery;  Laterality: Right;  Right L2-5 removal of hardware  . INGUINAL HERNIA REPAIR     x4  . IR RADIOLOGIST EVAL & MGMT  07/10/2018  . IR SACROPLASTY BILATERAL  07/15/2018  . REPAIR SPIGELIAN HERNIA  2011  . [place] ,2014  . TONSILLECTOMY    . UPPER GASTROINTESTINAL ENDOSCOPY    . VASECTOMY      Allergies  Allergen Reactions  . Celebrex [Celecoxib] Other (See Comments)    Created stomach ulcers.  Yvette Rack [Cyclobenzaprine] Other (See Comments)    Caught in hiatal hernia and caused extreme pain    Outpatient Encounter Medications as of 08/02/2018  Medication Sig  . Amino Acids-Protein Hydrolys (FEEDING SUPPLEMENT, PRO-STAT SUGAR FREE 64,) LIQD Take 30 mLs by mouth daily.  Marland Kitchen buPROPion (WELLBUTRIN SR) 150 MG 12 hr tablet Take 150 mg by mouth 2 (two) times daily.   . carboxymethylcellul-glycerin (REFRESH REPAIR) 0.5-0.9 % ophthalmic solution Place 1 drop into both eyes 3 (three) times daily.  . cholecalciferol (VITAMIN D) 1000 UNITS tablet Take 1,000 Units by mouth daily.  Marland Kitchen docusate sodium (COLACE) 100 MG capsule Take 100 mg by mouth daily as needed for mild constipation.   . ferrous sulfate 325 (65 FE) MG EC tablet Take 325 mg by mouth daily with breakfast.  . gabapentin (NEURONTIN) 300 MG capsule Take 1 tablet PO at 4 PM  and 1 tablet at 10 PM  . HYDROcodone-acetaminophen (NORCO/VICODIN) 5-325 MG tablet Take 1 tablet by mouth every 6 (six) hours as needed for moderate pain.  Marland Kitchen ipratropium (ATROVENT) 0.03 % nasal spray Place 2 sprays into both nostrils 2 (two) times daily.   Marland Kitchen lovastatin (MEVACOR) 20 MG tablet Take 10 mg by mouth at bedtime.   . Magnesium 250 MG TABS Take 250 mg by mouth daily.   . methocarbamol (ROBAXIN) 500 MG tablet Take 500 mg by mouth every 6 (six) hours as needed for muscle spasms.   . Multiple  Minerals-Vitamins (CITRACAL PLUS BONE DENSITY) TABS Take 1 tablet by mouth 2 (two) times daily.   . Multiple Vitamins-Minerals (CENTRUM ADULTS PO) Take 1 tablet by mouth daily.  . Multiple Vitamins-Minerals (PRESERVISION AREDS 2 PO) Take 1 capsule by mouth 2 (two) times daily.  Marland Kitchen omeprazole (PRILOSEC) 20 MG capsule Take 20 mg by mouth 2 (two) times daily.  . Polyethylene Glycol 3350 (MIRALAX PO) Take 1 Scoop by mouth daily.   . propranolol (INDERAL) 10 MG tablet Take 10 mg by mouth daily.   . ranitidine (ZANTAC) 300 MG tablet TAKE 1 TABLET(300 MG) BY MOUTH AT BEDTIME  . rOPINIRole (REQUIP) 4 MG tablet Take 4 mg by mouth at bedtime.  . Tamsulosin HCl (FLOMAX) 0.4 MG CAPS Take 0.4 mg by mouth  2 (two) times daily.   . traMADol (ULTRAM) 50 MG tablet Take 100 mg by mouth 2 (two) times daily as needed.   No facility-administered encounter medications on file as of 08/02/2018.     Review of Systems  Constitutional: Negative for appetite change, chills, fatigue and fever.  HENT: Negative for congestion, rhinorrhea, sinus pressure, sinus pain, sneezing and sore throat.   Eyes: Negative for discharge, redness and itching.  Respiratory: Negative for cough, chest tightness, shortness of breath and wheezing.   Cardiovascular: Negative for chest pain, palpitations and leg swelling.  Gastrointestinal: Negative for abdominal distention, abdominal pain, blood in stool, constipation, diarrhea, nausea and vomiting.  Genitourinary: Positive for hematuria. Negative for dysuria, flank pain and urgency.       Indwelling foley catheter   Musculoskeletal: Positive for gait problem.  Skin: Negative for color change, pallor, rash and wound.  Neurological: Negative for dizziness, light-headedness and headaches.  Hematological: Does not bruise/bleed easily.  Psychiatric/Behavioral: Negative for agitation and sleep disturbance. The patient is not nervous/anxious.     Immunization History  Administered Date(s)  Administered  . Zoster Recombinat (Shingrix) 10/17/2017, 01/25/2018   Pertinent  Health Maintenance Due  Topic Date Due  . HEMOGLOBIN A1C  1931-05-21  . FOOT EXAM  08/13/1941  . OPHTHALMOLOGY EXAM  08/13/1941  . URINE MICROALBUMIN  08/13/1943  . PNA vac Low Risk Adult (1 of 2 - PCV13) 08/12/1998  . INFLUENZA VACCINE  07/04/2018   Fall Risk  01/04/2017 10/06/2016 09/29/2016 09/22/2016 07/19/2016  Falls in the past year? No No No No No   Functional Status Survey:    Vitals:   08/02/18 1100  BP: (!) 149/74  Pulse: (!) 57  Resp: 20  Temp: 97.8 F (36.6 C)  SpO2: 96%  Weight: 152 lb 9.6 oz (69.2 kg)  Height: 5\' 6"  (1.676 m)   Body mass index is 24.63 kg/m. Physical Exam  Constitutional:  Thin built elderly in no acute distress   HENT:  Head: Normocephalic.  Mouth/Throat: Oropharynx is clear and moist. No oropharyngeal exudate.  Eyes: Pupils are equal, round, and reactive to light. Conjunctivae and EOM are normal. Right eye exhibits no discharge. Left eye exhibits no discharge. No scleral icterus.  Neck: Normal range of motion. No JVD present. No thyromegaly present.  Cardiovascular: Normal rate, regular rhythm, normal heart sounds and intact distal pulses. Exam reveals no gallop and no friction rub.  No murmur heard. Pulmonary/Chest: Effort normal and breath sounds normal. No respiratory distress. He has no wheezes. He has no rales.  Abdominal: Soft. Bowel sounds are normal. He exhibits no distension and no mass. There is no tenderness. There is no rebound and no guarding.  Genitourinary:  Genitourinary Comments: Foley catheter draining bloody colored urine  Musculoskeletal: He exhibits no edema or tenderness.  Unsteady gait ambulates with assistance.   Lymphadenopathy:    He has no cervical adenopathy.  Neurological:  Alert and oriented to person and place   Skin: Skin is warm and dry. No rash noted. No erythema. No pallor.  Psychiatric: He has a normal mood and affect.  His speech is normal and behavior is normal. Judgment and thought content normal.  Nursing note and vitals reviewed.   Labs reviewed: Recent Labs    10/01/17 0525 10/03/17 2130  05/30/18 07/08/18 07/15/18 0704 07/18/18  NA 144 140   < > 138 132 133* 135  K 3.7 3.8   < > 4.3 4.4 4.2 4.4  CL 112* 109  --  104  --  100  --   CO2 23 23  --  26  --  22  --   GLUCOSE 100* 93  --   --   --  135*  --   BUN 13 13   < > 22* 22* 23 18  CREATININE 0.86 0.84   < > 0.8 0.65 0.76 0.52  CALCIUM 8.9 9.2   < > 8.6 8.4 8.9 8.2   < > = values in this interval not displayed.   Recent Labs    09/29/17 1941 10/01/17 0525 05/30/18 07/08/18  AST 53* 51* 32 102  ALT 23 24 16  67  ALKPHOS 76 62 112 122  BILITOT 0.4 0.7  --  0.5  PROT 6.8 6.9 6.2 6.3  ALBUMIN 3.8 3.9 3.3 3.3   Recent Labs    10/17/17 1100  02/28/18 1126  05/17/18 1524 05/30/18 07/08/18 07/15/18 0704  WBC 5.1   < > 6.1   < > 7.3 6.9 7.8 8.4  NEUTROABS 3.7  --  4.5  --  5.6  --   --   --   HGB 11.2*   < > 10.6*   < > 12.0* 11.9* 12.0 12.6*  HCT 33.6*   < > 31.6*   < > 35.8* 36* 35.5 39.8  MCV 91.0  --  89.5  --  87.7  --  85.7 90.2  PLT 223.0   < > 178.0   < > 197.0 312  --  311   < > = values in this interval not displayed.   Lab Results  Component Value Date   TSH 2.35 07/08/2018   No results found for: HGBA1C Lab Results  Component Value Date   CHOL 141 05/30/2018   HDL 34 (A) 05/30/2018   LDLCALC 88 05/30/2018   TRIG 91 05/30/2018    Significant Diagnostic Results in last 30 days:  Ir Sacroplasty Bilateral  Result Date: 07/15/2018 INDICATION: 84 year old male with a history of bilateral sacral insufficiency fractures. EXAM: IR SACRALPLASTY INJ BILAT BILATERAL BONE BIOPSY OF THE SACRAL FRACTURE SITES COMPARISON:  CT 07/03/2018 MEDICATIONS: As antibiotic prophylaxis, 2 g Ancef was ordered pre-procedure and administered intravenously within 1 hour of incision. ANESTHESIA/SEDATION: Moderate (conscious) sedation was  employed during this procedure. A total of Versed 1.0 mg and Fentanyl 37.5 mcg was administered intravenously. Moderate Sedation Time: 40 minutes. The patient's level of consciousness and vital signs were monitored continuously by radiology nursing throughout the procedure under my direct supervision. FLUOROSCOPY TIME:  Fluoroscopy Time: 7 minutes 0 seconds (0,932 mGy) COMPLICATIONS: None immediate. PROCEDURE: Following a full explanation of the procedure along with the potential associated complications, an informed witnessed consent was obtained. The patient was placed prone on the fluoroscopic table. The skin overlying the sacral region was then prepped and draped in the usual sterile fashion. The right sacral base was first addressed. 1% lidocaine was used for local anesthesia, to the level of the posterior cortex. The kypho on 13 gauge needle was then advanced under biplane fluoroscopy into the sacral base, parallel to the sacral base. Once we confirmed needle position, a core biopsy was retrieved with a 16 gauge device. Sample was placed into formalin. The left sacral base was then addressed. 1% lidocaine was used for local anesthesia, to the level of the posterior cortex. The selected Stryker jam she needle was then advanced under biplane fluoroscopy, parallel to the sacral base. Eighth 16 gauge core biopsy was then retrieved and placed into  formalin. Methylmethacrylate cement was then infused starting on the left under biplane fluoroscopy. Left was first treated, and then right. Final images were stored. Patient tolerated the procedure well and remained hemodynamically stable throughout. No complications were encountered and no significant blood loss. IMPRESSION: Status post fluoroscopic-guided needle placement for deep core bone biopsy at bilateral sacral base. Status post vertebral body augmentation using methylmethacrylate at the bilateral sacral base fracture sites. Signed, Dulcy Fanny. Dellia Nims, RPVI  [facility] Radiology Electronically Signed   By: Corrie Mckusick D.O.   On: 07/15/2018 11:36   Ir Radiologist Eval & Mgmt  Result Date: 07/10/2018 Please refer to notes tab for details about interventional procedure. (Op Note)   Assessment/Plan  Gross hematuria Afebrile.Reports stinging to catheter insertion site.Medication reviewed not on any anticoagulant.He does have a medical of Prostate cancer and previous hematuria.will rule out urinary tract infection.Obtain urine specimen for U/A and C/S.continue to monitor Temp curve.   Family/ staff Communication: Reviewed plan of care with patient and facility Nurse.  Labs/tests ordered: urine specimen for U/A and C/S  Sandrea Hughs, NP

## 2018-08-03 DIAGNOSIS — R350 Frequency of micturition: Secondary | ICD-10-CM | POA: Diagnosis not present

## 2018-08-03 DIAGNOSIS — R309 Painful micturition, unspecified: Secondary | ICD-10-CM | POA: Diagnosis not present

## 2018-08-03 LAB — URINALYSIS, MICROSCOPIC (REFLEX)
BILIRUBIN: NEGATIVE
GLUCOSE: NEGATIVE
KETONE: NEGATIVE
Nitrite: POSITIVE
PH: 7.5
Specific Gravity: 1.016

## 2018-08-05 ENCOUNTER — Non-Acute Institutional Stay (SKILLED_NURSING_FACILITY): Payer: Medicare Other | Admitting: Internal Medicine

## 2018-08-05 DIAGNOSIS — K627 Radiation proctitis: Secondary | ICD-10-CM | POA: Diagnosis not present

## 2018-08-05 DIAGNOSIS — K5900 Constipation, unspecified: Secondary | ICD-10-CM | POA: Diagnosis not present

## 2018-08-05 DIAGNOSIS — Z9989 Dependence on other enabling machines and devices: Secondary | ICD-10-CM | POA: Diagnosis not present

## 2018-08-05 DIAGNOSIS — G4733 Obstructive sleep apnea (adult) (pediatric): Secondary | ICD-10-CM | POA: Diagnosis not present

## 2018-08-05 DIAGNOSIS — N400 Enlarged prostate without lower urinary tract symptoms: Secondary | ICD-10-CM | POA: Diagnosis not present

## 2018-08-05 DIAGNOSIS — N3001 Acute cystitis with hematuria: Secondary | ICD-10-CM

## 2018-08-05 DIAGNOSIS — F329 Major depressive disorder, single episode, unspecified: Secondary | ICD-10-CM | POA: Diagnosis not present

## 2018-08-05 DIAGNOSIS — R29898 Other symptoms and signs involving the musculoskeletal system: Secondary | ICD-10-CM | POA: Diagnosis not present

## 2018-08-05 DIAGNOSIS — I2510 Atherosclerotic heart disease of native coronary artery without angina pectoris: Secondary | ICD-10-CM | POA: Diagnosis not present

## 2018-08-05 DIAGNOSIS — R339 Retention of urine, unspecified: Secondary | ICD-10-CM | POA: Diagnosis not present

## 2018-08-05 DIAGNOSIS — M545 Low back pain: Secondary | ICD-10-CM | POA: Diagnosis not present

## 2018-08-05 DIAGNOSIS — Z8546 Personal history of malignant neoplasm of prostate: Secondary | ICD-10-CM | POA: Diagnosis not present

## 2018-08-05 DIAGNOSIS — W19XXXA Unspecified fall, initial encounter: Secondary | ICD-10-CM | POA: Diagnosis not present

## 2018-08-05 DIAGNOSIS — G2581 Restless legs syndrome: Secondary | ICD-10-CM | POA: Diagnosis not present

## 2018-08-05 DIAGNOSIS — G8929 Other chronic pain: Secondary | ICD-10-CM | POA: Diagnosis not present

## 2018-08-05 DIAGNOSIS — M6281 Muscle weakness (generalized): Secondary | ICD-10-CM | POA: Diagnosis not present

## 2018-08-05 DIAGNOSIS — Z89421 Acquired absence of other right toe(s): Secondary | ICD-10-CM | POA: Diagnosis not present

## 2018-08-05 DIAGNOSIS — Q2733 Arteriovenous malformation of digestive system vessel: Secondary | ICD-10-CM | POA: Diagnosis not present

## 2018-08-05 DIAGNOSIS — I10 Essential (primary) hypertension: Secondary | ICD-10-CM | POA: Diagnosis not present

## 2018-08-05 DIAGNOSIS — Z9181 History of falling: Secondary | ICD-10-CM | POA: Diagnosis not present

## 2018-08-05 DIAGNOSIS — E785 Hyperlipidemia, unspecified: Secondary | ICD-10-CM | POA: Diagnosis not present

## 2018-08-05 DIAGNOSIS — E559 Vitamin D deficiency, unspecified: Secondary | ICD-10-CM | POA: Diagnosis not present

## 2018-08-05 DIAGNOSIS — K5731 Diverticulosis of large intestine without perforation or abscess with bleeding: Secondary | ICD-10-CM | POA: Diagnosis not present

## 2018-08-05 NOTE — Progress Notes (Signed)
Location:  [facility] of Service:  SNF (31) Provider:  Blanchie Serve, MD  Leanna Battles, MD  Patient Care Team: Leanna Battles, MD as PCP - General (Internal Medicine) [name], MD (Hematology and Oncology) [name], MD as Attending Physician (Urology) [name], MD (Gastroenterology)  Extended Emergency Contact Information Primary Emergency Contact: Lucia,Janet H Address: [address]          [address]          [place], [place] [address] [name] of [place] Phone: [phone] Mobile Phone: [phone] Relation: Spouse   Goals of care: Advanced Directive information Advanced Directives 08/02/2018  Does Patient Have a Medical Advance Directive? Yes  Type of Paramedic of [place];Living will  Does patient want to make changes to medical advance directive? -  Copy of [place] in Chart? Yes  Would patient like information on creating a medical advance directive? -     Chief Complaint  Patient presents with  . Acute Visit    fall x 2, abnormal u/a and culture    HPI:  Pt is a 84 y.o. male seen today for an acute visit for falls and abnormal u/a result. He had a fall this am and one on 08/02/18. No injury reported from fall and neurochecks are being performed. He has been having gross hematuria and u/a with c/s was sent on 08/02/18. Labs have partially resulted. Pt seen in his room with charge nurse present. He does not remember how he fell both time. He mentions that he woke up finding himself in floor. neurocheck so far has been within range.    Past Medical History:  Diagnosis Date  . Anemia   . Arthritis   . AVM (arteriovenous malformation)   . Barrett's esophagus   . BPH (benign prostatic hyperplasia)   . CAD (coronary artery disease)    only on stress test; never had MI, PCI or CABG.  In 2008, LVEF was reportedly normal. pt. denies  . Cataract   . Constipation     . CPAP (continuous positive airway pressure) dependence   . Depression   . Diverticulosis    diverticular bleeding  . Diverticulosis   . GERD (gastroesophageal reflux disease)   . History of radiation therapy   . Hyperlipidemia   . Hypoglycemia   . Lymphocytosis 06/2012  . Mastodynia   . Murmur    slight  . Nasal congestion   . Osteoporosis   . Osteoporosis   . Prostate cancer ([place])   . Sleep apnea    wears CPAP  . Urinary retention with incomplete bladder emptying    pt uses bathroom and within 30 mins needs to go again  . Vitamin D deficiency    Past Surgical History:  Procedure Laterality Date  . AMPUTATION TOE Right 09/20/2017   Procedure: Right 4th toe amputation;  Surgeon: Wylene Simmer, MD;  Location: [facility];  Service: Orthopedics;  Laterality: Right;  foot block  . BACK SURGERY  04/2013  . back surgury     f  . BLEPHAROPLASTY Bilateral 02/23/15  . CATARACT EXTRACTION    . COLONOSCOPY    . COLONOSCOPY N/A 10/02/2017   Procedure: COLONOSCOPY;  Surgeon: Doran Stabler, MD;  Location: [facility];  Service: Gastroenterology;  Laterality: N/A;  . COLONOSCOPY WITH PROPOFOL N/A 10/01/2017   Procedure: COLONOSCOPY WITH PROPOFOL;  Surgeon: Doran Stabler, MD;  Location:  [facility];  Service: Gastroenterology;  Laterality: N/A;  . COLONOSCOPY WITH PROPOFOL N/A 03/07/2018   Procedure: COLONOSCOPY WITH PROPOFOL;  Surgeon: [name], MD;  Location: [facility];  Service: Endoscopy;  Laterality: N/A;  . ESOPHAGOGASTRODUODENOSCOPY (EGD) WITH PROPOFOL N/A 10/01/2017   Procedure: ESOPHAGOGASTRODUODENOSCOPY (EGD) WITH PROPOFOL;  Surgeon: Doran Stabler, MD;  Location: [facility];  Service: Gastroenterology;  Laterality: N/A;  . EYE SURGERY Bilateral   . FINGER SURGERY Right    right thumb  . HARDWARE REMOVAL Right 05/15/2016   Procedure: Right Lumbar Two-Lumbar Five Removal of Hardware;  Surgeon: Kristeen Miss, MD;  Location: [facility];   Service: Neurosurgery;  Laterality: Right;  Right L2-5 removal of hardware  . INGUINAL HERNIA REPAIR     x4  . IR RADIOLOGIST EVAL & MGMT  07/10/2018  . IR SACROPLASTY BILATERAL  07/15/2018  . REPAIR SPIGELIAN HERNIA  2011  . [facility] ,2014  . TONSILLECTOMY    . UPPER GASTROINTESTINAL ENDOSCOPY    . VASECTOMY      Allergies  Allergen Reactions  . Celebrex [Celecoxib] Other (See Comments)    Created stomach ulcers.  Yvette Rack [Cyclobenzaprine] Other (See Comments)    Caught in hiatal hernia and caused extreme pain    Outpatient Encounter Medications as of 08/05/2018  Medication Sig  . Amino Acids-Protein Hydrolys (FEEDING SUPPLEMENT, PRO-STAT SUGAR FREE 64,) LIQD Take 30 mLs by mouth daily.  Marland Kitchen buPROPion (WELLBUTRIN SR) 150 MG 12 hr tablet Take 150 mg by mouth 2 (two) times daily.   . carboxymethylcellul-glycerin (REFRESH REPAIR) 0.5-0.9 % ophthalmic solution Place 1 drop into both eyes 3 (three) times daily.  . cholecalciferol (VITAMIN D) 1000 UNITS tablet Take 1,000 Units by mouth daily.  Marland Kitchen docusate sodium (COLACE) 100 MG capsule Take 100 mg by mouth daily as needed for mild constipation.   . ferrous sulfate 325 (65 FE) MG EC tablet Take 325 mg by mouth daily with breakfast.  . gabapentin (NEURONTIN) 300 MG capsule Take 1 tablet PO at 4 PM  and 1 tablet at 10 PM  . HYDROcodone-acetaminophen (NORCO/VICODIN) 5-325 MG tablet Take 1 tablet by mouth every 6 (six) hours as needed for moderate pain.  Marland Kitchen ipratropium (ATROVENT) 0.03 % nasal spray Place 2 sprays into both nostrils 2 (two) times daily.   Marland Kitchen lovastatin (MEVACOR) 20 MG tablet Take 10 mg by mouth at bedtime.   . Magnesium 250 MG TABS Take 250 mg by mouth daily.   . methocarbamol (ROBAXIN) 500 MG tablet Take 500 mg by mouth every 6 (six) hours as needed for muscle spasms.   . Multiple Minerals-Vitamins (CITRACAL PLUS BONE DENSITY) TABS Take 1 tablet by mouth 2 (two) times daily.   . Multiple Vitamins-Minerals (CENTRUM ADULTS  PO) Take 1 tablet by mouth daily.  . Multiple Vitamins-Minerals (PRESERVISION AREDS 2 PO) Take 1 capsule by mouth 2 (two) times daily.  Marland Kitchen omeprazole (PRILOSEC) 20 MG capsule Take 20 mg by mouth 2 (two) times daily.  . Polyethylene Glycol 3350 (MIRALAX PO) Take 1 Scoop by mouth daily.   . propranolol (INDERAL) 10 MG tablet Take 10 mg by mouth daily.   . ranitidine (ZANTAC) 300 MG tablet TAKE 1 TABLET(300 MG) BY MOUTH AT BEDTIME  . rOPINIRole (REQUIP) 4 MG tablet Take 4 mg by mouth at bedtime.  . Tamsulosin HCl (FLOMAX) 0.4 MG CAPS Take 0.4 mg by mouth 2 (two) times daily.   . traMADol (ULTRAM) 50 MG tablet Take  100 mg by mouth 2 (two) times daily as needed.   No facility-administered encounter medications on file as of 08/05/2018.     Review of Systems  Constitutional: Positive for fatigue. Negative for appetite change, chills and fever.  HENT: Negative for congestion.   Respiratory: Negative for shortness of breath.   Cardiovascular: Negative for chest pain and palpitations.  Gastrointestinal: Negative for abdominal pain, diarrhea, nausea and vomiting.  Genitourinary: Positive for hematuria. Negative for decreased urine volume and flank pain.       Has indwelling foley catheter  Musculoskeletal: Positive for arthralgias, back pain and gait problem.  Neurological: Negative for dizziness and headaches.  Psychiatric/Behavioral:       Intermittent confusion present    Immunization History  Administered Date(s) Administered  . Zoster Recombinat (Shingrix) 10/17/2017, 01/25/2018   Pertinent  Health Maintenance Due  Topic Date Due  . HEMOGLOBIN A1C  [birth year]  . FOOT EXAM  08/13/1941  . OPHTHALMOLOGY EXAM  08/13/1943  . URINE MICROALBUMIN  08/13/1943  . PNA vac Low Risk Adult (1 of 2 - PCV13) 08/12/1998  . INFLUENZA VACCINE  07/04/2018   Fall Risk  01/04/2017 10/06/2016 09/29/2016 09/22/2016 07/19/2016  Falls in the past year? _0    Functional Status Survey:    Vitals:    08/05/18 1208  BP: 128/64  Pulse: 62  Resp: 18  Temp: (!) 97.1 F (36.2 C)   There is no height or weight on file to calculate BMI. Physical Exam  Constitutional: He appears well-developed and well-nourished. No distress.  HENT:  Head: Normocephalic and atraumatic.  Dry mucus membrane  Eyes: Pupils are equal, round, and reactive to light. Conjunctivae and EOM are normal.  Neck: Normal range of motion. Neck supple.  Cardiovascular: Normal rate and regular rhythm.  Pulmonary/Chest: Effort normal and breath sounds normal. He has no wheezes. He has no rales.  Abdominal: Soft. Bowel sounds are normal. There is no tenderness. There is no rebound and no guarding.  Foley catheter with bloody urine in bag  Musculoskeletal: Normal range of motion. He exhibits no edema.  Unsteady gait, needs 2 person assistance with transfer  Lymphadenopathy:    He has no cervical adenopathy.  Neurological: He is alert. No cranial nerve deficit. He exhibits normal muscle tone.  Oriented to person and place, not to time, walker only with therapy, can wheel himself around in wheelchair  Skin: Skin is warm and dry. He is not diaphoretic.  Psychiatric: He has a normal mood and affect.    Labs reviewed: Recent Labs    10/01/17 0525 10/03/17 2130  05/30/18 07/08/18 07/15/18 0704 07/18/18  NA 144 140   < > 138 132 133* 135  K 3.7 3.8   < > 4.3 4.4 4.2 4.4  CL 112* 109  --  104  --  100  --   CO2 23 23  --  26  --  22  --   GLUCOSE 100* 93  --   --   --  135*  --   BUN 13 13   < > 22* 22* 23 18  CREATININE 0.86 0.84   < > 0.8 0.65 0.76 0.52  CALCIUM 8.9 9.2   < > 8.6 8.4 8.9 8.2   < > = values in this interval not displayed.   Recent Labs    09/29/17 1941 10/01/17 0525 05/30/18 07/08/18  AST 53* 51* 32 102  ALT _1 67  ALKPHOS 76 62  112 122  BILITOT 0.4 0.7  --  0.5  PROT 6.8 6.9 6.2 6.3  ALBUMIN 3.8 3.9 3.3 3.3   Recent Labs    10/17/17 1100  02/28/18 1126  05/17/18 1524 05/30/18  07/08/18 07/15/18 0704  WBC 5.1   < > 6.1   < > 7.3 6.9 7.8 8.4  NEUTROABS 3.7  --  4.5  --  5.6  --   --   --   HGB 11.2*   < > 10.6*   < > 12.0* 11.9* 12.0 12.6*  HCT 33.6*   < > 31.6*   < > 35.8* 36* 35.5 39.8  MCV 91.0  --  89.5  --  87.7  --  85.7 90.2  PLT 223.0   < > 178.0   < > 197.0 312  --  311   < > = values in this interval not displayed.   Lab Results  Component Value Date   TSH 2.35 07/08/2018   No results found for: HGBA1C Lab Results  Component Value Date   CHOL 141 05/30/2018   HDL 34 (A) 05/30/2018   LDLCALC 88 05/30/2018   TRIG 91 05/30/2018    Significant Diagnostic Results in last 30 days:  Ir Sacroplasty Bilateral  Result Date: 07/15/2018 INDICATION: 84 year old male with a history of bilateral sacral insufficiency fractures. EXAM: IR SACRALPLASTY INJ BILAT BILATERAL BONE BIOPSY OF THE SACRAL FRACTURE SITES COMPARISON:  CT 07/03/2018 MEDICATIONS: As antibiotic prophylaxis, 2 g Ancef was ordered pre-procedure and administered intravenously within 1 hour of incision. ANESTHESIA/SEDATION: Moderate (conscious) sedation was employed during this procedure. A total of Versed 1.0 mg and Fentanyl 37.5 mcg was administered intravenously. Moderate Sedation Time: 40 minutes. The patient's level of consciousness and vital signs were monitored continuously by radiology nursing throughout the procedure under my direct supervision. FLUOROSCOPY TIME:  Fluoroscopy Time: 7 minutes 0 seconds (0,962 mGy) COMPLICATIONS: None immediate. PROCEDURE: Following a full explanation of the procedure along with the potential associated complications, an informed witnessed consent was obtained. The patient was placed prone on the fluoroscopic table. The skin overlying the sacral region was then prepped and draped in the usual sterile fashion. The right sacral base was first addressed. 1% lidocaine was used for local anesthesia, to the level of the posterior cortex. The kypho on 13 gauge needle was  then advanced under biplane fluoroscopy into the sacral base, parallel to the sacral base. Once we confirmed needle position, a core biopsy was retrieved with a 16 gauge device. Sample was placed into formalin. The left sacral base was then addressed. 1% lidocaine was used for local anesthesia, to the level of the posterior cortex. The selected [employer]ryker jam she needle was then advanced under biplane fluoroscopy, parallel to the sacral base. Eighth 16 gauge core biopsy was then retrieved and placed into formalin. Methylmethacrylate cement was then infused starting on the left under biplane fluoroscopy. Left was first treated, and then right. Final images were stored. Patient tolerated the procedure well and remained hemodynamically stable throughout. No complications were encountered and no significant blood loss. IMPRESSION: Status post fluoroscopic-guided needle placement for deep core bone biopsy at bilateral sacral base. Status post vertebral body augmentation using methylmethacrylate at the bilateral sacral base fracture sites. Signed, Dulcy Fanny. Dellia Nims, RPVI [facility] Rad[facility] Electronically Signed   By: Corrie Mckusick D.O.   On: 07/15/2018 11:36   Ir Radiologist Eval & Mgmt  Result Date: 07/10/2018 Please refer to notes tab  for details about interventional procedure. (Op Note)   Assessment/Plan  1. Acute cystitis with hematuria Urine analysis positive for blood, wbc, nitrite and leukocyte esterase and urine culture positive for > 100,000 colonies of gram negative bacilli. Start nitrofurantoin 100 mg bid with florastor 250 mg bid for 1 week. Encouraged hydration. Continue foley care. Monitor vital signs  2. Fall, initial encounter 2 falls in last 4 days. His UTI could be contributing to his weakness and fall besides his generalized weakness, unsteady gait and poor safety awareness. No signs or symptom of head trauma noted. Denies headache.  Neurological exam normal. Encouraged to press call light and call for help for transfer. Pt has had history of falls in past and remains a high fall risk. Continue with neurocheck.     Family/ staff Communication: reviewed care plan with patient and charge nurse.    Labs/tests ordered:  Cbc with diff, cmp with eGFR, f/u urine culture and sensitivity report.   Blanchie Serve, MD Internal Medicine [facility] [address] [place], [place] [address] Cell Phone (Monday-Friday 8 am - 5 pm): [phone] On Call: [phone] and follow prompts after 5 pm and on weekends Office Phone: [phone] Office Fax: [phone]

## 2018-08-06 ENCOUNTER — Encounter: Payer: Self-pay | Admitting: *Deleted

## 2018-08-06 ENCOUNTER — Other Ambulatory Visit: Payer: Medicare Other

## 2018-08-06 DIAGNOSIS — I2510 Atherosclerotic heart disease of native coronary artery without angina pectoris: Secondary | ICD-10-CM | POA: Diagnosis not present

## 2018-08-06 DIAGNOSIS — F329 Major depressive disorder, single episode, unspecified: Secondary | ICD-10-CM | POA: Diagnosis not present

## 2018-08-06 DIAGNOSIS — Z13228 Encounter for screening for other metabolic disorders: Secondary | ICD-10-CM | POA: Diagnosis not present

## 2018-08-06 DIAGNOSIS — R29898 Other symptoms and signs involving the musculoskeletal system: Secondary | ICD-10-CM | POA: Diagnosis not present

## 2018-08-06 DIAGNOSIS — D649 Anemia, unspecified: Secondary | ICD-10-CM | POA: Diagnosis not present

## 2018-08-06 DIAGNOSIS — G4733 Obstructive sleep apnea (adult) (pediatric): Secondary | ICD-10-CM | POA: Diagnosis not present

## 2018-08-06 DIAGNOSIS — M6281 Muscle weakness (generalized): Secondary | ICD-10-CM | POA: Diagnosis not present

## 2018-08-06 DIAGNOSIS — M545 Low back pain: Secondary | ICD-10-CM | POA: Diagnosis not present

## 2018-08-06 LAB — COMPLETE METABOLIC PANEL WITH GFR
ALK PHOS: 90
ALT: 11
AST: 27
Albumin: 2.8
BILIRUBIN TOTAL: 0.3
BUN: 13 (ref 4–21)
Calcium: 8.2
Creat: 0.69
GLUCOSE: 82
POTASSIUM: 4
Sodium: 137
Total Protein: 5.3 g/dL

## 2018-08-06 LAB — CBC
HCT: 30.5
HEMOGLOBIN: 10.3
MCV: 85
PLATELET COUNT: 251
WBC: 7.3

## 2018-08-07 ENCOUNTER — Encounter: Payer: Self-pay | Admitting: Family

## 2018-08-07 ENCOUNTER — Non-Acute Institutional Stay (SKILLED_NURSING_FACILITY): Payer: Medicare Other | Admitting: Family

## 2018-08-07 ENCOUNTER — Encounter: Payer: Self-pay | Admitting: *Deleted

## 2018-08-07 ENCOUNTER — Other Ambulatory Visit: Payer: Medicare Other

## 2018-08-07 DIAGNOSIS — I2510 Atherosclerotic heart disease of native coronary artery without angina pectoris: Secondary | ICD-10-CM | POA: Diagnosis not present

## 2018-08-07 DIAGNOSIS — M6281 Muscle weakness (generalized): Secondary | ICD-10-CM | POA: Diagnosis not present

## 2018-08-07 DIAGNOSIS — E785 Hyperlipidemia, unspecified: Secondary | ICD-10-CM | POA: Diagnosis not present

## 2018-08-07 DIAGNOSIS — I10 Essential (primary) hypertension: Secondary | ICD-10-CM

## 2018-08-07 DIAGNOSIS — F329 Major depressive disorder, single episode, unspecified: Secondary | ICD-10-CM | POA: Diagnosis not present

## 2018-08-07 DIAGNOSIS — Z23 Encounter for immunization: Secondary | ICD-10-CM

## 2018-08-07 DIAGNOSIS — D638 Anemia in other chronic diseases classified elsewhere: Secondary | ICD-10-CM

## 2018-08-07 DIAGNOSIS — F32A Depression, unspecified: Secondary | ICD-10-CM

## 2018-08-07 DIAGNOSIS — G629 Polyneuropathy, unspecified: Secondary | ICD-10-CM | POA: Diagnosis not present

## 2018-08-07 DIAGNOSIS — R29898 Other symptoms and signs involving the musculoskeletal system: Secondary | ICD-10-CM | POA: Diagnosis not present

## 2018-08-07 DIAGNOSIS — M545 Low back pain: Secondary | ICD-10-CM | POA: Diagnosis not present

## 2018-08-07 DIAGNOSIS — G4733 Obstructive sleep apnea (adult) (pediatric): Secondary | ICD-10-CM | POA: Diagnosis not present

## 2018-08-07 NOTE — Progress Notes (Signed)
Location:  [facility] Room Number: 2 Place of Service:  SNF (31) Provider: Zara Wendt FNP-C   Charles Battles, MD  Patient Care Team: Charles Battles, MD as PCP - General (Internal Medicine) Charles Putnam, MD (Hematology and Oncology) Charles Bucco, MD as Attending Physician (Urology) Charles Artist, MD (Gastroenterology)  Extended Emergency Contact Information Primary Emergency Contact: [name],Charles H Address: [address]          [address]          [place], [place] [address] Charles [name] of [place] Phone: [phone] Mobile Phone: [phone] Relation: Spouse  Code Status:  Full Code  Goals of care: Advanced Directive information Advanced Directives 08/07/2018  Does Patient Have a Medical Advance Directive? Yes  Type of Advance Directive Out of facility DNR (pink MOST or yellow form);Living will  Does patient want to make changes to medical advance directive? -  Copy of [place] in Chart? Yes  Would patient like information on creating a medical advance directive? -     Chief Complaint  Patient presents with  . Medical Management of Chronic Issues    monthly routine    HPI:  Pt is a 84 y.o. [name] seen today [place] for medical management of chronic diseases. He has a medical history of HTN, CAD,GERD,BPH,Prostate Cancer,depression among other conditions.He is seen in his room today with wife and facility Nurse present at bedside.He has had fall episode 08/02/2018 and 08/05/2018 rolling out of the bed.No injuries sustained.He denies any acute issues during visit.He had hematuria on 08/02/2018.His recent urine specimen for U/A and C/S collected 08/02/2018 was positive for nitrites,leukocytes 2+ and > 100,000 colonies of enterobacter cloacae complex.He was started on Cipro 500 mg tablet twice daily x 7 days on 08/05/2018.He continues to work with Physical therapy for generalized weakness and chronic low back  pain.He continues to follow up with Neuro has upcoming appointment 08/21/2018.   Past Medical History:  Diagnosis Date  . Anemia   . Arthritis   . AVM (arteriovenous malformation)   . Barrett's esophagus   . BPH (benign prostatic hyperplasia)   . CAD (coronary artery disease)    only on stress test; never had MI, PCI or CABG.  In 2008, LVEF was reportedly normal. pt. denies  . Cataract   . Constipation   . CPAP (continuous positive airway pressure) dependence   . Depression   . Diverticulosis    diverticular bleeding  . Diverticulosis   . GERD (gastroesophageal reflux disease)   . History of radiation therapy   . Hyperlipidemia   . Hypoglycemia   . Lymphocytosis 06/2012  . Mastodynia   . Murmur    slight  . Nasal congestion   . Osteoporosis   . Osteoporosis   . Prostate cancer ([facility])   . Sleep apnea    wears CPAP  . Urinary retention with incomplete bladder emptying    pt uses bathroom and within 30 mins needs to go again  . Vitamin D deficiency    Past Surgical History:  Procedure Laterality Date  . AMPUTATION TOE Right 09/20/2017   Procedure: Right 4th toe amputation;  Surgeon: Wylene Simmer, MD;  Location: [facility];  Service: Orthopedics;  Laterality: Right;  foot block  . BACK SURGERY  04/2013  . back surgury     f  . BLEPHAROPLASTY Bilateral 02/23/15  . CATARACT EXTRACTION    . COLONOSCOPY    . COLONOSCOPY  N/A 10/02/2017   Procedure: COLONOSCOPY;  Surgeon: Doran Stabler, MD;  Location: [facility];  Service: Gastroenterology;  Laterality: N/A;  . COLONOSCOPY WITH PROPOFOL N/A 10/01/2017   Procedure: COLONOSCOPY WITH PROPOFOL;  Surgeon: Doran Stabler, MD;  Location: [facility];  Service: Gastroenterology;  Laterality: N/A;  . COLONOSCOPY WITH PROPOFOL N/A 03/07/2018   Procedure: COLONOSCOPY WITH PROPOFOL;  Surgeon: Charles Artist, MD;  Location: [facility];  Service: Endoscopy;  Laterality: N/A;  . ESOPHAGOGASTRODUODENOSCOPY (EGD)  WITH PROPOFOL N/A 10/01/2017   Procedure: ESOPHAGOGASTRODUODENOSCOPY (EGD) WITH PROPOFOL;  Surgeon: Doran Stabler, MD;  Location: [facility];  Service: Gastroenterology;  Laterality: N/A;  . EYE SURGERY Bilateral   . FINGER SURGERY Right    right thumb  . HARDWARE REMOVAL Right 05/15/2016   Procedure: Right Lumbar Two-Lumbar Five Removal of Hardware;  Surgeon: Kristeen Miss, MD;  Location: [facility];  Service: Neurosurgery;  Laterality: Right;  Right L2-5 removal of hardware  . INGUINAL HERNIA REPAIR     x4  . IR RADIOLOGIST EVAL & MGMT  07/10/2018  . IR SACROPLASTY BILATERAL  07/15/2018  . REPAIR SPIGELIAN HERNIA  2011  . [place] ,2014  . TONSILLECTOMY    . UPPER GASTROINTESTINAL ENDOSCOPY    . VASECTOMY      Allergies  Allergen Reactions  . Celebrex [Celecoxib] Other (See Comments)    Created stomach ulcers.  Yvette Rack [Cyclobenzaprine] Other (See Comments)    Caught in hiatal hernia and caused extreme pain    Allergies as of 08/07/2018      Reactions   Celebrex [celecoxib] Other (See Comments)   Created stomach ulcers.   Flexeril [cyclobenzaprine] Other (See Comments)   Caught in hiatal hernia and caused extreme pain      Medication List        Accurate as of 08/07/18  3:12 PM. Always use your most recent med list.          buPROPion 150 MG 12 hr tablet Commonly known as:  WELLBUTRIN SR Take 150 mg by mouth 2 (two) times daily.   CENTRUM ADULTS PO Take 1 tablet by mouth daily.   PRESERVISION AREDS 2 PO Take 1 capsule by mouth 2 (two) times daily.   cholecalciferol 1000 units tablet Commonly known as:  VITAMIN D Take 1,000 Units by mouth daily.   ciprofloxacin 500 MG tablet Commonly known as:  CIPRO Take 500 mg by mouth 2 (two) times daily.   CITRACAL PLUS BONE DENSITY Tabs Take 1 tablet by mouth 2 (two) times daily.   docusate sodium 100 MG capsule Commonly known as:  COLACE Take 100 mg by mouth daily as needed for mild constipation.    feeding supplement (PRO-STAT SUGAR FREE 64) Liqd Take 30 mLs by mouth daily.   ferrous sulfate 325 (65 FE) MG EC tablet Take 325 mg by mouth daily with breakfast.   gabapentin 300 MG capsule Commonly known as:  NEURONTIN Take 1 tablet PO at 4 PM  and 1 tablet at 10 PM   HYDROcodone-acetaminophen 5-325 MG tablet Commonly known as:  NORCO/VICODIN Take 1 tablet by mouth every 6 (six) hours as needed for moderate pain.   ipratropium 0.03 % nasal spray Commonly known as:  ATROVENT Place 2 sprays into both nostrils 2 (two) times daily.   lovastatin 20 MG tablet Commonly known as:  MEVACOR Take 10 mg by mouth at bedtime.   Magnesium 250 MG Tabs Take 250 mg  by mouth daily.   methocarbamol 500 MG tablet Commonly known as:  ROBAXIN Take 500 mg by mouth every 6 (six) hours as needed for muscle spasms.   MIRALAX PO Take 1 Scoop by mouth daily.   omeprazole 20 MG capsule Commonly known as:  PRILOSEC Take 40 mg by mouth 2 (two) times daily.   propranolol 10 MG tablet Commonly known as:  INDERAL Take 10 mg by mouth daily.   ranitidine 300 MG tablet Commonly known as:  ZANTAC TAKE 1 TABLET(300 MG) BY MOUTH AT BEDTIME   REFRESH REPAIR 0.5-0.9 % ophthalmic solution Generic drug:  carboxymethylcellul-glycerin Place 1 drop into both eyes 3 (three) times daily.   rOPINIRole 4 MG tablet Commonly known as:  REQUIP Take 4 mg by mouth at bedtime.   saccharomyces boulardii 250 MG capsule Commonly known as:  FLORASTOR Take 250 mg by mouth 2 (two) times daily.   tamsulosin 0.4 MG Caps capsule Commonly known as:  FLOMAX Take 0.4 mg by mouth 2 (two) times daily.   traMADol 50 MG tablet Commonly known as:  ULTRAM Take 100 mg by mouth 2 (two) times daily as needed.       Review of Systems  Constitutional: Negative for appetite change, chills, fatigue and fever.  HENT: Negative for congestion, rhinorrhea, sinus pressure, sinus pain, sneezing and sore throat.   Eyes: Negative  for discharge, redness and itching.  Respiratory: Negative for cough, chest tightness, shortness of breath and wheezing.   Cardiovascular: Negative for chest pain, palpitations and leg swelling.  Gastrointestinal: Negative for abdominal distention, abdominal pain, constipation, diarrhea, nausea and vomiting.  Endocrine: Negative for cold intolerance, heat intolerance, polydipsia, polyphagia and polyuria.  Genitourinary: Negative for dysuria, flank pain, frequency, hematuria and urgency.  Musculoskeletal: Positive for arthralgias and gait problem.  Skin: Negative for color change, pallor, rash and wound.  Neurological: Negative for dizziness, light-headedness, numbness and headaches.  Hematological: Does not bruise/bleed easily.  Psychiatric/Behavioral: Negative for agitation, confusion and sleep disturbance. The patient is not nervous/anxious.     Immunization History  Administered Date(s) Administered  . Zoster Recombinat (Shingrix) 10/17/2017, 01/25/2018   Pertinent  Health Maintenance Due  Topic Date Due  . HEMOGLOBIN A1C  09/05/33  . PNA vac Low Risk Adult (1 of 2 - PCV13) 08/12/1998  . INFLUENZA VACCINE  07/04/2018  . FOOT EXAM  09/03/2019 (Originally 08/13/1941)  . OPHTHALMOLOGY EXAM  09/03/2019 (Originally 08/13/1941)  . URINE MICROALBUMIN  09/03/2019 (Originally 08/13/1941)   Fall Risk  01/04/2017 10/06/2016 09/29/2016 09/22/2016 07/19/2016  Falls in the past year? No No No No No   Functional Status Survey: Is the patient deaf or have difficulty hearing?: No Does the patient have difficulty seeing, even when wearing glasses/contacts?: No Does the patient have difficulty concentrating, remembering, or making decisions?: Yes Does the patient have difficulty walking or climbing stairs?: Yes Does the patient have difficulty dressing or bathing?: Yes Does the patient have difficulty doing errands alone such as visiting a doctor's office or shopping?: Yes  Vitals:   08/07/18 1146    BP: 137/76  Pulse: 61  Resp: 20  Temp: 97.6 F (36.4 C)  SpO2: 96%  Weight: 152 lb 9.6 oz (69.2 kg)  Height: 5\' 6"  (1.676 m)   Body mass index is 24.63 kg/m. Physical Exam  Constitutional:  Tall thin built,elderly in no acute distress   HENT:  Head: Normocephalic.  Right Ear: External ear normal.  Left Ear: External ear normal.  Mouth/Throat: Oropharynx is clear  and moist. No oropharyngeal exudate.  Eyes: Pupils are equal, round, and reactive to light. Conjunctivae and EOM are normal. Right eye exhibits no discharge. Left eye exhibits no discharge. No scleral icterus.  Neck: Normal range of motion. No JVD present. No thyromegaly present.  Cardiovascular: Normal rate, regular rhythm, normal heart sounds and intact distal pulses. Exam reveals no gallop and no friction rub.  No murmur heard. Pulmonary/Chest: [place] normal and breath sounds normal. No respiratory distress. He has no wheezes. He has no rales.  Abdominal: Soft. Bowel sounds are normal. He exhibits no distension and no mass. There is no tenderness. There is no rebound and no guarding.  Genitourinary:  Genitourinary Comments: Indwelling foley catheter drainage adequate amounts of clear yellow urine   Musculoskeletal: He exhibits no edema or tenderness.  Moves x 4 extremities.unsteady gait ambulates with walker with Therapy.   Lymphadenopathy:    He has no cervical adenopathy.  Neurological: Gait abnormal.  Alert and oriented to person and place   Skin: Skin is warm and dry. No rash noted. No erythema. No pallor.  Psychiatric: He has a normal mood and affect. His speech is normal and behavior is normal. Judgment and thought content normal.  Nursing note and vitals reviewed.   Labs reviewed: Recent Labs    10/01/17 0525 10/03/17 2130  05/30/18  07/15/18 0704 07/18/18 08/06/18  NA 144 140   < > 138   < > 133* 135 137  K 3.7 3.8   < > 4.3   < > 4.2 4.4 4.0  CL 112* 109  --  104  --  100  --   --   CO2 23 23  --   26  --  22  --   --   GLUCOSE 100* 93  --   --   --  135*  --   --   BUN Charles Charles   < > 22*   < > 23 18 Charles   CREATININE 0.86 0.84   < > 0.8   < > 0.76 0.52 0.69  CALCIUM 8.9 9.2  --  8.6   < > 8.9 8.2 8.2   < > = values in this interval not displayed.   Recent Labs    10/01/17 0525 05/30/18 07/08/18 08/06/18  AST 51* 32 102 27  ALT 24 16 67 11  ALKPHOS 62 112 122 90  BILITOT 0.7  --  0.5 0.3  PROT 6.9 6.2 6.3 5.3  ALBUMIN 3.9 3.3 3.3 2.8   Recent Labs    10/17/17 1100  02/28/18 1126  05/17/18 1524 05/30/18 07/08/18 07/15/18 0704 08/06/18  WBC 5.1   < > 6.1   < > 7.3 6.9 7.8 8.4 7.3  NEUTROABS 3.7  --  4.5  --  5.6  --   --   --   --   HGB 11.2*   < > 10.6*   < > 12.0* 11.9* 12.0 12.6* 10.3  HCT 33.6*   < > 31.6*   < > 35.8* 36* 35.5 39.8 30.5  MCV 91.0  --  89.5  --  87.7  --  85.7 90.2 85  PLT 223.0   < > 178.0   < > 197.0 312  --  311  --    < > = values in this interval not displayed.   Lab Results  Component Value Date   TSH 2.35 07/08/2018   No results found for: HGBA1C Lab Results  Component Value Date  CHOL 141 05/30/2018   HDL 34 (A) 05/30/2018   LDLCALC 88 05/30/2018   TRIG 91 05/30/2018    Significant Diagnostic Results in last 30 days:  Ir Sacroplasty Bilateral  Result Date: 07/15/2018 INDICATION: 84 year old [name] with a history of bilateral sacral insufficiency fractures. EXAM: IR SACRALPLASTY INJ BILAT BILATERAL BONE BIOPSY OF THE SACRAL FRACTURE SITES COMPARISON:  CT 07/03/2018 MEDICATIONS: As antibiotic prophylaxis, 2 g Ancef was ordered pre-procedure and administered intravenously within 1 hour of incision. ANESTHESIA/SEDATION: Moderate (conscious) sedation was employed during this procedure. A total of Versed 1.0 mg and Fentanyl 37.5 mcg was administered intravenously. Moderate Sedation Time: 40 minutes. The patient's level of consciousness and vital signs were monitored continuously by radiology nursing throughout the procedure under my direct  supervision. FLUOROSCOPY TIME:  Fluoroscopy Time: 7 minutes 0 seconds (3,299 mGy) COMPLICATIONS: None immediate. PROCEDURE: Following a full explanation of the procedure along with the potential associated complications, an informed witnessed consent was obtained. The patient was placed prone on the fluoroscopic table. The skin overlying the sacral region was then prepped and draped in the usual sterile fashion. The right sacral base was first addressed. 1% lidocaine was used for local anesthesia, to the level of the posterior cortex. The kypho on Charles gauge needle was then advanced under biplane fluoroscopy into the sacral base, parallel to the sacral base. Once we confirmed needle position, a core biopsy was retrieved with a 16 gauge device. Sample was placed into formalin. The left sacral base was then addressed. 1% lidocaine was used for local anesthesia, to the level of the posterior cortex. The selected Stryker jam she needle was then advanced under biplane fluoroscopy, parallel to the sacral base. Eighth 16 gauge core biopsy was then retrieved and placed into formalin. Methylmethacrylate cement was then infused starting on the left under biplane fluoroscopy. Left was first treated, and then right. Final images were stored. Patient tolerated the procedure well and remained hemodynamically stable throughout. No complications were encountered and no significant blood loss. IMPRESSION: Status post fluoroscopic-guided needle placement for deep core bone biopsy at bilateral sacral base. Status post vertebral body augmentation using methylmethacrylate at the bilateral sacral base fracture sites. Signed, Dulcy Fanny. Dellia Nims, RPVI [facility] Radiology Electronically Signed   By: Corrie Mckusick D.O.   On: 07/15/2018 11:36   Ir Radiologist Eval & Mgmt  Result Date: 07/10/2018 Please refer to notes tab for details about interventional procedure. (Op  Note) Assessment/Plan 1. Essential hypertension B/p reviewed stable.continue on inderal 10 mg tablet daily.continue to monitor CMP 08/12/2018.   2. Hyperlipidemia, unspecified hyperlipidemia type Continue on Mevacor 10 mg tablet daily at bedtime.Monitor lipid panel.  3. Depression Mood stable.continue on Wellbutrin SR 150 mg tablet twice daily.monitor for mood changes.    4. Anemia of chronic disease Recent Hgb 10.3 (08/06/2018) previous 12.6 possible due to recent hematuria.continue on ferrous sulfate 325 mg tablet daily with breakfast.Recheck CBC 08/12/2018.    5. Neuropathy Continue on gabapentin 300 mg capsule at 4 pm and 10 PM.   6. Need for pneumococcal vaccination Administer Prevnar Charles inject 0.5 ml via I.M x 1 dose.monitor injection site for reaction.    7. Need for Tdap vaccination Administer Tdap 0.5 ml inject 0.5 ml via I.M x 1 dose. Monitor injection site for any reaction.  Family/ staff Communication: Reviewed plan of care with patient,wife and facility Nurse.   Labs/tests ordered: CBC,CMP 08/12/2018    Nelda Bucks Branna Cortina, NP

## 2018-08-08 DIAGNOSIS — M6281 Muscle weakness (generalized): Secondary | ICD-10-CM | POA: Diagnosis not present

## 2018-08-08 DIAGNOSIS — R29898 Other symptoms and signs involving the musculoskeletal system: Secondary | ICD-10-CM | POA: Diagnosis not present

## 2018-08-08 DIAGNOSIS — M545 Low back pain: Secondary | ICD-10-CM | POA: Diagnosis not present

## 2018-08-08 DIAGNOSIS — G4733 Obstructive sleep apnea (adult) (pediatric): Secondary | ICD-10-CM | POA: Diagnosis not present

## 2018-08-08 DIAGNOSIS — I2510 Atherosclerotic heart disease of native coronary artery without angina pectoris: Secondary | ICD-10-CM | POA: Diagnosis not present

## 2018-08-08 DIAGNOSIS — F329 Major depressive disorder, single episode, unspecified: Secondary | ICD-10-CM | POA: Diagnosis not present

## 2018-08-09 DIAGNOSIS — G4733 Obstructive sleep apnea (adult) (pediatric): Secondary | ICD-10-CM | POA: Diagnosis not present

## 2018-08-09 DIAGNOSIS — F329 Major depressive disorder, single episode, unspecified: Secondary | ICD-10-CM | POA: Diagnosis not present

## 2018-08-09 DIAGNOSIS — R29898 Other symptoms and signs involving the musculoskeletal system: Secondary | ICD-10-CM | POA: Diagnosis not present

## 2018-08-09 DIAGNOSIS — M6281 Muscle weakness (generalized): Secondary | ICD-10-CM | POA: Diagnosis not present

## 2018-08-09 DIAGNOSIS — M545 Low back pain: Secondary | ICD-10-CM | POA: Diagnosis not present

## 2018-08-09 DIAGNOSIS — I2510 Atherosclerotic heart disease of native coronary artery without angina pectoris: Secondary | ICD-10-CM | POA: Diagnosis not present

## 2018-08-11 DIAGNOSIS — I2510 Atherosclerotic heart disease of native coronary artery without angina pectoris: Secondary | ICD-10-CM | POA: Diagnosis not present

## 2018-08-11 DIAGNOSIS — F329 Major depressive disorder, single episode, unspecified: Secondary | ICD-10-CM | POA: Diagnosis not present

## 2018-08-11 DIAGNOSIS — M545 Low back pain: Secondary | ICD-10-CM | POA: Diagnosis not present

## 2018-08-11 DIAGNOSIS — G4733 Obstructive sleep apnea (adult) (pediatric): Secondary | ICD-10-CM | POA: Diagnosis not present

## 2018-08-11 DIAGNOSIS — M6281 Muscle weakness (generalized): Secondary | ICD-10-CM | POA: Diagnosis not present

## 2018-08-11 DIAGNOSIS — R29898 Other symptoms and signs involving the musculoskeletal system: Secondary | ICD-10-CM | POA: Diagnosis not present

## 2018-08-13 DIAGNOSIS — R338 Other retention of urine: Secondary | ICD-10-CM | POA: Diagnosis not present

## 2018-08-13 DIAGNOSIS — R310 Gross hematuria: Secondary | ICD-10-CM | POA: Diagnosis not present

## 2018-08-13 DIAGNOSIS — R29898 Other symptoms and signs involving the musculoskeletal system: Secondary | ICD-10-CM | POA: Diagnosis not present

## 2018-08-13 DIAGNOSIS — I2510 Atherosclerotic heart disease of native coronary artery without angina pectoris: Secondary | ICD-10-CM | POA: Diagnosis not present

## 2018-08-13 DIAGNOSIS — F329 Major depressive disorder, single episode, unspecified: Secondary | ICD-10-CM | POA: Diagnosis not present

## 2018-08-13 DIAGNOSIS — M545 Low back pain: Secondary | ICD-10-CM | POA: Diagnosis not present

## 2018-08-13 DIAGNOSIS — G4733 Obstructive sleep apnea (adult) (pediatric): Secondary | ICD-10-CM | POA: Diagnosis not present

## 2018-08-13 DIAGNOSIS — M6281 Muscle weakness (generalized): Secondary | ICD-10-CM | POA: Diagnosis not present

## 2018-08-14 DIAGNOSIS — M6281 Muscle weakness (generalized): Secondary | ICD-10-CM | POA: Diagnosis not present

## 2018-08-14 DIAGNOSIS — R29898 Other symptoms and signs involving the musculoskeletal system: Secondary | ICD-10-CM | POA: Diagnosis not present

## 2018-08-14 DIAGNOSIS — I2510 Atherosclerotic heart disease of native coronary artery without angina pectoris: Secondary | ICD-10-CM | POA: Diagnosis not present

## 2018-08-14 DIAGNOSIS — M545 Low back pain: Secondary | ICD-10-CM | POA: Diagnosis not present

## 2018-08-14 DIAGNOSIS — G4733 Obstructive sleep apnea (adult) (pediatric): Secondary | ICD-10-CM | POA: Diagnosis not present

## 2018-08-14 DIAGNOSIS — F329 Major depressive disorder, single episode, unspecified: Secondary | ICD-10-CM | POA: Diagnosis not present

## 2018-08-15 DIAGNOSIS — G4733 Obstructive sleep apnea (adult) (pediatric): Secondary | ICD-10-CM | POA: Diagnosis not present

## 2018-08-15 DIAGNOSIS — I2510 Atherosclerotic heart disease of native coronary artery without angina pectoris: Secondary | ICD-10-CM | POA: Diagnosis not present

## 2018-08-15 DIAGNOSIS — R29898 Other symptoms and signs involving the musculoskeletal system: Secondary | ICD-10-CM | POA: Diagnosis not present

## 2018-08-15 DIAGNOSIS — F329 Major depressive disorder, single episode, unspecified: Secondary | ICD-10-CM | POA: Diagnosis not present

## 2018-08-15 DIAGNOSIS — M545 Low back pain: Secondary | ICD-10-CM | POA: Diagnosis not present

## 2018-08-15 DIAGNOSIS — M6281 Muscle weakness (generalized): Secondary | ICD-10-CM | POA: Diagnosis not present

## 2018-08-16 DIAGNOSIS — M25511 Pain in right shoulder: Secondary | ICD-10-CM | POA: Diagnosis not present

## 2018-08-19 DIAGNOSIS — M545 Low back pain: Secondary | ICD-10-CM | POA: Diagnosis not present

## 2018-08-19 DIAGNOSIS — I2510 Atherosclerotic heart disease of native coronary artery without angina pectoris: Secondary | ICD-10-CM | POA: Diagnosis not present

## 2018-08-19 DIAGNOSIS — R29898 Other symptoms and signs involving the musculoskeletal system: Secondary | ICD-10-CM | POA: Diagnosis not present

## 2018-08-19 DIAGNOSIS — G4733 Obstructive sleep apnea (adult) (pediatric): Secondary | ICD-10-CM | POA: Diagnosis not present

## 2018-08-19 DIAGNOSIS — F329 Major depressive disorder, single episode, unspecified: Secondary | ICD-10-CM | POA: Diagnosis not present

## 2018-08-19 DIAGNOSIS — M6281 Muscle weakness (generalized): Secondary | ICD-10-CM | POA: Diagnosis not present

## 2018-08-20 DIAGNOSIS — I2510 Atherosclerotic heart disease of native coronary artery without angina pectoris: Secondary | ICD-10-CM | POA: Diagnosis not present

## 2018-08-20 DIAGNOSIS — R29898 Other symptoms and signs involving the musculoskeletal system: Secondary | ICD-10-CM | POA: Diagnosis not present

## 2018-08-20 DIAGNOSIS — D649 Anemia, unspecified: Secondary | ICD-10-CM | POA: Diagnosis not present

## 2018-08-20 DIAGNOSIS — F329 Major depressive disorder, single episode, unspecified: Secondary | ICD-10-CM | POA: Diagnosis not present

## 2018-08-20 DIAGNOSIS — M6281 Muscle weakness (generalized): Secondary | ICD-10-CM | POA: Diagnosis not present

## 2018-08-20 DIAGNOSIS — R319 Hematuria, unspecified: Secondary | ICD-10-CM | POA: Diagnosis not present

## 2018-08-20 DIAGNOSIS — G4733 Obstructive sleep apnea (adult) (pediatric): Secondary | ICD-10-CM | POA: Diagnosis not present

## 2018-08-20 DIAGNOSIS — M545 Low back pain: Secondary | ICD-10-CM | POA: Diagnosis not present

## 2018-08-20 LAB — CBC AND DIFFERENTIAL
HEMATOCRIT: 32 — AB (ref 41–53)
Hemoglobin: 10.4 — AB (ref 13.5–17.5)
PLATELETS: 259 (ref 150–399)
WBC: 6.7

## 2018-08-21 ENCOUNTER — Other Ambulatory Visit: Payer: Medicare Other

## 2018-08-21 DIAGNOSIS — C61 Malignant neoplasm of prostate: Secondary | ICD-10-CM | POA: Diagnosis not present

## 2018-08-21 DIAGNOSIS — R29898 Other symptoms and signs involving the musculoskeletal system: Secondary | ICD-10-CM | POA: Diagnosis not present

## 2018-08-21 DIAGNOSIS — M545 Low back pain: Secondary | ICD-10-CM | POA: Diagnosis not present

## 2018-08-21 DIAGNOSIS — G4733 Obstructive sleep apnea (adult) (pediatric): Secondary | ICD-10-CM | POA: Diagnosis not present

## 2018-08-21 DIAGNOSIS — F329 Major depressive disorder, single episode, unspecified: Secondary | ICD-10-CM | POA: Diagnosis not present

## 2018-08-21 DIAGNOSIS — M6281 Muscle weakness (generalized): Secondary | ICD-10-CM | POA: Diagnosis not present

## 2018-08-21 DIAGNOSIS — R310 Gross hematuria: Secondary | ICD-10-CM | POA: Diagnosis not present

## 2018-08-21 DIAGNOSIS — I2510 Atherosclerotic heart disease of native coronary artery without angina pectoris: Secondary | ICD-10-CM | POA: Diagnosis not present

## 2018-08-22 DIAGNOSIS — G4733 Obstructive sleep apnea (adult) (pediatric): Secondary | ICD-10-CM | POA: Diagnosis not present

## 2018-08-22 DIAGNOSIS — M6281 Muscle weakness (generalized): Secondary | ICD-10-CM | POA: Diagnosis not present

## 2018-08-22 DIAGNOSIS — M545 Low back pain: Secondary | ICD-10-CM | POA: Diagnosis not present

## 2018-08-22 DIAGNOSIS — R29898 Other symptoms and signs involving the musculoskeletal system: Secondary | ICD-10-CM | POA: Diagnosis not present

## 2018-08-22 DIAGNOSIS — I2510 Atherosclerotic heart disease of native coronary artery without angina pectoris: Secondary | ICD-10-CM | POA: Diagnosis not present

## 2018-08-22 DIAGNOSIS — F329 Major depressive disorder, single episode, unspecified: Secondary | ICD-10-CM | POA: Diagnosis not present

## 2018-08-26 DIAGNOSIS — I2510 Atherosclerotic heart disease of native coronary artery without angina pectoris: Secondary | ICD-10-CM | POA: Diagnosis not present

## 2018-08-26 DIAGNOSIS — R29898 Other symptoms and signs involving the musculoskeletal system: Secondary | ICD-10-CM | POA: Diagnosis not present

## 2018-08-26 DIAGNOSIS — M545 Low back pain: Secondary | ICD-10-CM | POA: Diagnosis not present

## 2018-08-26 DIAGNOSIS — G4733 Obstructive sleep apnea (adult) (pediatric): Secondary | ICD-10-CM | POA: Diagnosis not present

## 2018-08-26 DIAGNOSIS — M6281 Muscle weakness (generalized): Secondary | ICD-10-CM | POA: Diagnosis not present

## 2018-08-26 DIAGNOSIS — F329 Major depressive disorder, single episode, unspecified: Secondary | ICD-10-CM | POA: Diagnosis not present

## 2018-08-27 DIAGNOSIS — G4733 Obstructive sleep apnea (adult) (pediatric): Secondary | ICD-10-CM | POA: Diagnosis not present

## 2018-08-27 DIAGNOSIS — R29898 Other symptoms and signs involving the musculoskeletal system: Secondary | ICD-10-CM | POA: Diagnosis not present

## 2018-08-27 DIAGNOSIS — R338 Other retention of urine: Secondary | ICD-10-CM | POA: Diagnosis not present

## 2018-08-27 DIAGNOSIS — F329 Major depressive disorder, single episode, unspecified: Secondary | ICD-10-CM | POA: Diagnosis not present

## 2018-08-27 DIAGNOSIS — M545 Low back pain: Secondary | ICD-10-CM | POA: Diagnosis not present

## 2018-08-27 DIAGNOSIS — I2510 Atherosclerotic heart disease of native coronary artery without angina pectoris: Secondary | ICD-10-CM | POA: Diagnosis not present

## 2018-08-27 DIAGNOSIS — M6281 Muscle weakness (generalized): Secondary | ICD-10-CM | POA: Diagnosis not present

## 2018-08-28 ENCOUNTER — Encounter: Payer: Self-pay | Admitting: Family

## 2018-08-28 ENCOUNTER — Non-Acute Institutional Stay (SKILLED_NURSING_FACILITY): Payer: Medicare Other | Admitting: Family

## 2018-08-28 DIAGNOSIS — R531 Weakness: Secondary | ICD-10-CM | POA: Diagnosis not present

## 2018-08-28 DIAGNOSIS — E785 Hyperlipidemia, unspecified: Secondary | ICD-10-CM

## 2018-08-28 DIAGNOSIS — E161 Other hypoglycemia: Secondary | ICD-10-CM

## 2018-08-28 NOTE — Progress Notes (Signed)
Location:  [facility] Room Number: N 24 Place of Service:  SNF (31) Provider: Dinah Ngetich FNP-C  Leanna Battles, MD  Patient Care Team: Leanna Battles, MD as PCP - General (Internal Medicine) [name], MD (Hematology and Oncology) [name], MD as Attending Physician (Urology) [name], MD (Gastroenterology)  Extended Emergency Contact Information Primary Emergency Contact: Jamie,Janet H Address: [address]          [address]          [place], [place] [address] [name] of [place] Phone: [phone] Mobile Phone: [phone] Relation: Spouse  Code Status:  Full Code  Goals of care: Advanced Directive information Advanced Directives 08/28/2018  Does Patient Have a Medical Advance Directive? Yes  Type of Paramedic of [place];Living will  Does patient want to make changes to medical advance directive? -  Copy of [place] in Chart? Yes  Would patient like information on creating a medical advance directive? -     Chief Complaint  Patient presents with  . Acute Visit    Resident is being seen an acute visit.     HPI:  Pt is a 84 y.o. male seen today at [facility] for an acute visit for evaluation of " shakiness and weakness.He is seen in his room today.Facility Nurse reports patient was working with therapy 08/27/2018.Therapy reported patient was shaky,cheeks flushed and weak.He states usually gets shaky whenever his blood sugar is too low.He states had breakfast a little later than usual.He denies any shaking or weakness today.He also denies any fever or chills.He had was seen yesterday by urology voiding trial attempted but patient was unable to void. Of note he has a medical history of reactive hypoglycemic.    Past Medical History:  Diagnosis Date  . Anemia   . Arthritis   . AVM (arteriovenous malformation)   . Barrett's esophagus   . BPH (benign  prostatic hyperplasia)   . CAD (coronary artery disease)    only on stress test; never had MI, PCI or CABG.  In 2008, LVEF was reportedly normal. pt. denies  . Cataract   . Constipation   . CPAP (continuous positive airway pressure) dependence   . Depression   . Diverticulosis    diverticular bleeding  . Diverticulosis   . GERD (gastroesophageal reflux disease)   . History of radiation therapy   . Hyperlipidemia   . Hypoglycemia   . Lymphocytosis 06/2012  . Mastodynia   . Murmur    slight  . Nasal congestion   . Osteoporosis   . Osteoporosis   . Prostate cancer ([place])   . Sleep apnea    wears CPAP  . Urinary retention with incomplete bladder emptying    pt uses bathroom and within 30 mins needs to go again  . Vitamin D deficiency    Past Surgical History:  Procedure Laterality Date  . AMPUTATION TOE Right 09/20/2017   Procedure: Right 4th toe amputation;  Surgeon: Wylene Simmer, MD;  Location: [facility];  Service: Orthopedics;  Laterality: Right;  foot block  . BACK SURGERY  04/2013  . back surgury     f  . BLEPHAROPLASTY Bilateral 02/23/15  . CATARACT EXTRACTION    . COLONOSCOPY    . COLONOSCOPY N/A 10/02/2017   Procedure: COLONOSCOPY;  Surgeon: Doran Stabler, MD;  Location: [facility];  Service: Gastroenterology;  Laterality: N/A;  . COLONOSCOPY WITH PROPOFOL  N/A 10/01/2017   Procedure: COLONOSCOPY WITH PROPOFOL;  Surgeon: Doran Stabler, MD;  Location: [facility];  Service: Gastroenterology;  Laterality: N/A;  . COLONOSCOPY WITH PROPOFOL N/A 03/07/2018   Procedure: COLONOSCOPY WITH PROPOFOL;  Surgeon: [name], MD;  Location: [facility];  Service: Endoscopy;  Laterality: N/A;  . ESOPHAGOGASTRODUODENOSCOPY (EGD) WITH PROPOFOL N/A 10/01/2017   Procedure: ESOPHAGOGASTRODUODENOSCOPY (EGD) WITH PROPOFOL;  Surgeon: Doran Stabler, MD;  Location: [facility];  Service: Gastroenterology;  Laterality: N/A;  . EYE SURGERY Bilateral   .  FINGER SURGERY Right    right thumb  . HARDWARE REMOVAL Right 05/15/2016   Procedure: Right Lumbar Two-Lumbar Five Removal of Hardware;  Surgeon: Kristeen Miss, MD;  Location: [facility];  Service: Neurosurgery;  Laterality: Right;  Right L2-5 removal of hardware  . INGUINAL HERNIA REPAIR     x4  . IR RADIOLOGIST EVAL & MGMT  07/10/2018  . IR SACROPLASTY BILATERAL  07/15/2018  . REPAIR SPIGELIAN HERNIA  2011  . [place] ,2014  . TONSILLECTOMY    . UPPER GASTROINTESTINAL ENDOSCOPY    . VASECTOMY      Allergies  Allergen Reactions  . Celebrex [Celecoxib] Other (See Comments)    Created stomach ulcers.  Yvette Rack [Cyclobenzaprine] Other (See Comments)    Caught in hiatal hernia and caused extreme pain    Outpatient Encounter Medications as of 08/28/2018  Medication Sig  . Amino Acids-Protein Hydrolys (FEEDING SUPPLEMENT, PRO-STAT SUGAR FREE 64,) LIQD Take 30 mLs by mouth daily.  Marland Kitchen buPROPion (WELLBUTRIN SR) 150 MG 12 hr tablet Take 150 mg by mouth 2 (two) times daily.   . carboxymethylcellul-glycerin (REFRESH REPAIR) 0.5-0.9 % ophthalmic solution Place 1 drop into both eyes 3 (three) times daily.  . cholecalciferol (VITAMIN D) 1000 UNITS tablet Take 1,000 Units by mouth daily.  Marland Kitchen docusate sodium (COLACE) 100 MG capsule Take 100 mg by mouth daily as needed for mild constipation.   . ferrous sulfate 325 (65 FE) MG EC tablet Take 325 mg by mouth daily with breakfast.  . gabapentin (NEURONTIN) 300 MG capsule Take 1 tablet PO at 4 PM  and 1 tablet at 10 PM  . HYDROcodone-acetaminophen (NORCO/VICODIN) 5-325 MG tablet Take 1 tablet by mouth every 6 (six) hours as needed for moderate pain.  Marland Kitchen ipratropium (ATROVENT) 0.03 % nasal spray Place 2 sprays into both nostrils 2 (two) times daily.   Marland Kitchen lovastatin (MEVACOR) 20 MG tablet Take 10 mg by mouth at bedtime.   . Magnesium 250 MG TABS Take 250 mg by mouth daily.   . methocarbamol (ROBAXIN) 500 MG tablet Take 500 mg by mouth every 6 (six)  hours as needed for muscle spasms.   . Multiple Minerals-Vitamins (CITRACAL PLUS BONE DENSITY) TABS Take 1 tablet by mouth 2 (two) times daily.   . Multiple Vitamins-Minerals (CENTRUM ADULTS PO) Take 1 tablet by mouth daily.  . Multiple Vitamins-Minerals (PRESERVISION AREDS 2 PO) Take 1 capsule by mouth 2 (two) times daily.  Marland Kitchen omeprazole (PRILOSEC) 20 MG capsule Take 40 mg by mouth 2 (two) times daily.   . Polyethylene Glycol 3350 (MIRALAX PO) Take 1 Scoop by mouth daily.   . propranolol (INDERAL) 10 MG tablet Take 10 mg by mouth daily.   . ranitidine (ZANTAC) 300 MG tablet TAKE 1 TABLET(300 MG) BY MOUTH AT BEDTIME  . rOPINIRole (REQUIP) 4 MG tablet Take 4 mg by mouth at bedtime.  . Tamsulosin HCl (FLOMAX) 0.4 MG CAPS  Take 0.4 mg by mouth 2 (two) times daily.   . traMADol (ULTRAM) 50 MG tablet Take 100 mg by mouth 2 (two) times daily as needed.   No facility-administered encounter medications on file as of 08/28/2018.     Review of Systems  Constitutional: Negative for appetite change, chills and fever.  HENT: Negative for congestion, rhinorrhea, sinus pressure, sinus pain, sneezing and sore throat.   Eyes: Negative for discharge, redness and visual disturbance.  Respiratory: Negative for cough, chest tightness, shortness of breath and wheezing.   Cardiovascular: Negative for chest pain, palpitations and leg swelling.  Gastrointestinal: Negative for abdominal distention, abdominal pain, constipation, diarrhea, nausea and vomiting.  Endocrine: Negative for polydipsia, polyphagia and polyuria.  Genitourinary:       Indwelling foley catheter   Musculoskeletal: Positive for arthralgias and gait problem.  Skin: Negative for color change, pallor and rash.  Neurological: Negative for dizziness, light-headedness and headaches.  Psychiatric/Behavioral: Negative for agitation, confusion and sleep disturbance. The patient is not nervous/anxious.     Immunization History  Administered Date(s)  Administered  . Zoster Recombinat (Shingrix) 10/17/2017, 01/25/2018   Pertinent  Health Maintenance Due  Topic Date Due  . HEMOGLOBIN A1C  26-Oct-1931  . PNA vac Low Risk Adult (1 of 2 - PCV13) 08/12/1998  . INFLUENZA VACCINE  07/04/2018  . FOOT EXAM  09/03/2019 (Originally 08/13/1941)  . OPHTHALMOLOGY EXAM  09/03/2019 (Originally 08/13/1941)  . URINE MICROALBUMIN  09/03/2019 (Originally 08/13/1941)   Fall Risk  01/04/2017 10/06/2016 09/29/2016 09/22/2016 07/19/2016  Falls in the past year? No No No No No    Vitals:   08/28/18 1145  BP: 117/71  Pulse: 64  Temp: 97.6 F (36.4 C)  TempSrc: Oral  SpO2: 96%  Weight: 151 lb 14.4 oz (68.9 kg)  Height: 5\' 6"  (1.676 m)   Body mass index is 24.52 kg/m. Physical Exam  Constitutional: No distress.  Thin built elderly in no acute distress   HENT:  Head: Normocephalic.  Mouth/Throat: Oropharynx is clear and moist. No oropharyngeal exudate.  Eyes: Pupils are equal, round, and reactive to light. Conjunctivae are normal. Right eye exhibits no discharge. Left eye exhibits no discharge. No scleral icterus.  Neck: Normal range of motion. No JVD present. No thyromegaly present.  Cardiovascular: Normal rate, regular rhythm, normal heart sounds and intact distal pulses. Exam reveals no gallop and no friction rub.  No murmur heard. Pulmonary/Chest: Effort normal and breath sounds normal. No respiratory distress. He has no wheezes. He has no rales.  Abdominal: Soft. Bowel sounds are normal. He exhibits no distension and no mass. There is no tenderness. There is no guarding.  Genitourinary:  Genitourinary Comments: Foley catheter draining amber clear urine.    Musculoskeletal: He exhibits no edema or tenderness.  Moves x 4 extremities unsteady gait self propel on wheelchair.  Lymphadenopathy:    He has no cervical adenopathy.  Neurological:  Alert and oriented to person and place   Skin: Skin is warm and dry. No rash noted. No erythema. No pallor.    Psychiatric: He has a normal mood and affect. His speech is normal and behavior is normal. Judgment and thought content normal.  Nursing note and vitals reviewed.  Labs reviewed: Recent Labs    10/01/17 0525 10/03/17 2130  05/30/18  07/15/18 0704 07/18/18 08/06/18  NA 144 140   < > 138   < > 133* 135 137  K 3.7 3.8   < > 4.3   < > 4.2  4.4 4.0  CL 112* 109  --  104  --  100  --   --   CO2 23 23  --  26  --  22  --   --   GLUCOSE 100* 93  --   --   --  135*  --   --   BUN 13 13   < > 22*   < > 23 18 13   CREATININE 0.86 0.84   < > 0.8   < > 0.76 0.52 0.69  CALCIUM 8.9 9.2  --  8.6   < > 8.9 8.2 8.2   < > = values in this interval not displayed.   Recent Labs    10/01/17 0525 05/30/18 07/08/18 08/06/18  AST 51* 32 102 27  ALT 24 16 67 11  ALKPHOS 62 112 122 90  BILITOT 0.7  --  0.5 0.3  PROT 6.9 6.2 6.3 5.3  ALBUMIN 3.9 3.3 3.3 2.8   Recent Labs    10/17/17 1100  02/28/18 1126  05/17/18 1524 05/30/18 07/08/18 07/15/18 0704 08/03/18 08/06/18 08/20/18  WBC 5.1   < > 6.1   < > 7.3 6.9 7.8 8.4 40-60 7.3 6.7  NEUTROABS 3.7  --  4.5  --  5.6  --   --   --   --   --   --   HGB 11.2*   < > 10.6*   < > 12.0* 11.9* 12.0 12.6*  --  10.3 10.4*  HCT 33.6*   < > 31.6*   < > 35.8* 36* 35.5 39.8  --  30.5 32*  MCV 91.0  --  89.5  --  87.7  --  85.7 90.2  --  85  --   PLT 223.0   < > 178.0   < > 197.0 312  --  311  --   --  259   < > = values in this interval not displayed.   Lab Results  Component Value Date   TSH 2.35 07/08/2018   No results found for: HGBA1C Lab Results  Component Value Date   CHOL 141 05/30/2018   HDL 34 (A) 05/30/2018   LDLCALC 88 05/30/2018   TRIG 91 05/30/2018    Significant Diagnostic Results in last 30 days:  No results found.  Assessment/Plan  1.Hypoglycemic reaction Had signs of hypoglycemia 08/27/2018 shaky but none today.Eats three meals daily and has snacks at bedside.Will check Hgb A1C 08/29/2018.check CBG three times per week on  Monday,wednesday and Friday.   2. Generalized weakness  Afebrile.weakness reported 08/27/2018 working with therapy but none today.negative exam findings.No myalgia.currently on lovastatin 10 mg tablet daily.check Fasting lipid panel 08/29/2018.will discontinue lovastatin if LDL at goal to prevent muscle weakness and myalgia given his advance age.    Family/ staff Communication: Reviewed plan of care with patient and facility Nurse supervisor  Labs/tests ordered: Hgb A1C and Fasting Lipid panel 08/29/2018  Sandrea Hughs, NP

## 2018-08-29 DIAGNOSIS — F329 Major depressive disorder, single episode, unspecified: Secondary | ICD-10-CM | POA: Diagnosis not present

## 2018-08-29 DIAGNOSIS — I2510 Atherosclerotic heart disease of native coronary artery without angina pectoris: Secondary | ICD-10-CM | POA: Diagnosis not present

## 2018-08-29 DIAGNOSIS — R531 Weakness: Secondary | ICD-10-CM | POA: Diagnosis not present

## 2018-08-29 DIAGNOSIS — G4733 Obstructive sleep apnea (adult) (pediatric): Secondary | ICD-10-CM | POA: Diagnosis not present

## 2018-08-29 DIAGNOSIS — M6281 Muscle weakness (generalized): Secondary | ICD-10-CM | POA: Diagnosis not present

## 2018-08-29 DIAGNOSIS — M545 Low back pain: Secondary | ICD-10-CM | POA: Diagnosis not present

## 2018-08-29 DIAGNOSIS — R29898 Other symptoms and signs involving the musculoskeletal system: Secondary | ICD-10-CM | POA: Diagnosis not present

## 2018-08-29 DIAGNOSIS — E161 Other hypoglycemia: Secondary | ICD-10-CM | POA: Diagnosis not present

## 2018-08-29 DIAGNOSIS — E785 Hyperlipidemia, unspecified: Secondary | ICD-10-CM | POA: Diagnosis not present

## 2018-08-29 LAB — LIPID PANEL
Cholesterol: 158 (ref 0–200)
HDL: 43 (ref 35–70)
LDL Cholesterol: 98
Triglycerides: 76 (ref 40–160)

## 2018-08-29 LAB — HEMOGLOBIN A1C: HEMOGLOBIN A1C: 5.2

## 2018-08-30 DIAGNOSIS — F329 Major depressive disorder, single episode, unspecified: Secondary | ICD-10-CM | POA: Diagnosis not present

## 2018-08-30 DIAGNOSIS — I2510 Atherosclerotic heart disease of native coronary artery without angina pectoris: Secondary | ICD-10-CM | POA: Diagnosis not present

## 2018-08-30 DIAGNOSIS — R29898 Other symptoms and signs involving the musculoskeletal system: Secondary | ICD-10-CM | POA: Diagnosis not present

## 2018-08-30 DIAGNOSIS — M545 Low back pain: Secondary | ICD-10-CM | POA: Diagnosis not present

## 2018-08-30 DIAGNOSIS — G4733 Obstructive sleep apnea (adult) (pediatric): Secondary | ICD-10-CM | POA: Diagnosis not present

## 2018-08-30 DIAGNOSIS — M6281 Muscle weakness (generalized): Secondary | ICD-10-CM | POA: Diagnosis not present

## 2018-09-02 DIAGNOSIS — M6281 Muscle weakness (generalized): Secondary | ICD-10-CM | POA: Diagnosis not present

## 2018-09-02 DIAGNOSIS — M545 Low back pain: Secondary | ICD-10-CM | POA: Diagnosis not present

## 2018-09-02 DIAGNOSIS — R29898 Other symptoms and signs involving the musculoskeletal system: Secondary | ICD-10-CM | POA: Diagnosis not present

## 2018-09-02 DIAGNOSIS — F329 Major depressive disorder, single episode, unspecified: Secondary | ICD-10-CM | POA: Diagnosis not present

## 2018-09-02 DIAGNOSIS — I2510 Atherosclerotic heart disease of native coronary artery without angina pectoris: Secondary | ICD-10-CM | POA: Diagnosis not present

## 2018-09-02 DIAGNOSIS — G4733 Obstructive sleep apnea (adult) (pediatric): Secondary | ICD-10-CM | POA: Diagnosis not present

## 2018-09-03 ENCOUNTER — Non-Acute Institutional Stay (SKILLED_NURSING_FACILITY): Payer: Medicare Other | Admitting: Family Medicine

## 2018-09-03 ENCOUNTER — Encounter: Payer: Self-pay | Admitting: Family Medicine

## 2018-09-03 DIAGNOSIS — Z9889 Other specified postprocedural states: Secondary | ICD-10-CM

## 2018-09-03 DIAGNOSIS — Z9989 Dependence on other enabling machines and devices: Secondary | ICD-10-CM | POA: Diagnosis not present

## 2018-09-03 DIAGNOSIS — G2581 Restless legs syndrome: Secondary | ICD-10-CM | POA: Diagnosis not present

## 2018-09-03 DIAGNOSIS — N400 Enlarged prostate without lower urinary tract symptoms: Secondary | ICD-10-CM | POA: Diagnosis not present

## 2018-09-03 DIAGNOSIS — K5900 Constipation, unspecified: Secondary | ICD-10-CM | POA: Diagnosis not present

## 2018-09-03 DIAGNOSIS — Z89421 Acquired absence of other right toe(s): Secondary | ICD-10-CM | POA: Diagnosis not present

## 2018-09-03 DIAGNOSIS — Z8546 Personal history of malignant neoplasm of prostate: Secondary | ICD-10-CM | POA: Diagnosis not present

## 2018-09-03 DIAGNOSIS — K627 Radiation proctitis: Secondary | ICD-10-CM | POA: Diagnosis not present

## 2018-09-03 DIAGNOSIS — C61 Malignant neoplasm of prostate: Secondary | ICD-10-CM

## 2018-09-03 DIAGNOSIS — G8929 Other chronic pain: Secondary | ICD-10-CM | POA: Diagnosis not present

## 2018-09-03 DIAGNOSIS — D649 Anemia, unspecified: Secondary | ICD-10-CM | POA: Diagnosis not present

## 2018-09-03 DIAGNOSIS — Q2733 Arteriovenous malformation of digestive system vessel: Secondary | ICD-10-CM | POA: Diagnosis not present

## 2018-09-03 DIAGNOSIS — Z9181 History of falling: Secondary | ICD-10-CM | POA: Diagnosis not present

## 2018-09-03 DIAGNOSIS — G4752 REM sleep behavior disorder: Secondary | ICD-10-CM | POA: Diagnosis not present

## 2018-09-03 DIAGNOSIS — I10 Essential (primary) hypertension: Secondary | ICD-10-CM | POA: Diagnosis not present

## 2018-09-03 DIAGNOSIS — R29898 Other symptoms and signs involving the musculoskeletal system: Secondary | ICD-10-CM | POA: Diagnosis not present

## 2018-09-03 DIAGNOSIS — F329 Major depressive disorder, single episode, unspecified: Secondary | ICD-10-CM | POA: Diagnosis not present

## 2018-09-03 DIAGNOSIS — K5731 Diverticulosis of large intestine without perforation or abscess with bleeding: Secondary | ICD-10-CM | POA: Diagnosis not present

## 2018-09-03 DIAGNOSIS — G3184 Mild cognitive impairment, so stated: Secondary | ICD-10-CM | POA: Diagnosis not present

## 2018-09-03 DIAGNOSIS — M545 Low back pain: Secondary | ICD-10-CM | POA: Diagnosis not present

## 2018-09-03 DIAGNOSIS — M6281 Muscle weakness (generalized): Secondary | ICD-10-CM | POA: Diagnosis not present

## 2018-09-03 DIAGNOSIS — R490 Dysphonia: Secondary | ICD-10-CM

## 2018-09-03 DIAGNOSIS — E785 Hyperlipidemia, unspecified: Secondary | ICD-10-CM | POA: Diagnosis not present

## 2018-09-03 DIAGNOSIS — G4733 Obstructive sleep apnea (adult) (pediatric): Secondary | ICD-10-CM | POA: Diagnosis not present

## 2018-09-03 DIAGNOSIS — R339 Retention of urine, unspecified: Secondary | ICD-10-CM | POA: Diagnosis not present

## 2018-09-03 DIAGNOSIS — E559 Vitamin D deficiency, unspecified: Secondary | ICD-10-CM | POA: Diagnosis not present

## 2018-09-03 NOTE — Progress Notes (Signed)
Provider:  Alain Honey, MD Location:  [facility] Room Number: E42 Place of Service:  SNF [phone])  PCP: Leanna Battles, MD Patient Care Team: Leanna Battles, MD as PCP - General (Internal Medicine) [name], MD (Hematology and Oncology) [name], MD as Attending Physician (Urology) [name], MD (Gastroenterology)  Extended Emergency Contact Information Primary Emergency Contact: Bracy,Janet H Address: [address]          [place]          [place], [place] [address] [name] of [place] Phone: [phone] Mobile Phone: [phone] Relation: Spouse  Code Status:  Full Code Goals of Care: Advanced Directive information Advanced Directives 09/03/2018  Does Patient Have a Medical Advance Directive? Yes  Type of Paramedic of [place];Living will;Out of facility DNR (pink MOST or yellow form)  Does patient want to make changes to medical advance directive? -  Copy of [place] in Chart? Yes  Would patient like information on creating a medical advance directive? -  Pre-existing out of facility DNR order (yellow form or pink MOST form) Yellow form placed in chart (order not valid for inpatient use)      Chief Complaint  Patient presents with  . Medical Management of Chronic Issues    Resident is being seen for a routine visit.     HPI: Patient is a 84 y.o. male seen today for medical management of chronic problems.  He normally resides with his wife in the independent apartments a friend's home when asked but has been a patient in skilled nursing after he suffered a fall back in June resulting in a sacral fracture.  This was treated with a sacral plasty  which immediately helped his pain.  During the course of treatment he was put on bedrest and rapidly deconditioned.  He is now in skilled nursing to regain strength with goal to move back to independent living.  He also is  having issue with hematuria.  He has an indwelling Foley catheter.  He is followed closely by urologist not only for this problem but history of prostate cancer with radiation treatments.  Most recent PSA showed some detectable and residual amount of enzyme.  Past Medical History:  Diagnosis Date  . Anemia   . Arthritis   . AVM (arteriovenous malformation)   . Barrett's esophagus   . BPH (benign prostatic hyperplasia)   . CAD (coronary artery disease)    only on stress test; never had MI, PCI or CABG.  In 2008, LVEF was reportedly normal. pt. denies  . Cataract   . Constipation   . CPAP (continuous positive airway pressure) dependence   . Depression   . Diverticulosis    diverticular bleeding  . Diverticulosis   . GERD (gastroesophageal reflux disease)   . History of radiation therapy   . Hyperlipidemia   . Hypoglycemia   . Lymphocytosis 06/2012  . Mastodynia   . Murmur    slight  . Nasal congestion   . Osteoporosis   . Osteoporosis   . Prostate cancer ([place])   . Sleep apnea    wears CPAP  . Urinary retention with incomplete bladder emptying    pt uses bathroom and within 30 mins needs to go again  . Vitamin D deficiency    Past Surgical History:  Procedure Laterality Date  . AMPUTATION TOE Right 09/20/2017   Procedure: Right 4th toe amputation;  Surgeon: Wylene Simmer, MD;  Location: [facility];  Service: Orthopedics;  Laterality: Right;  foot block  . BACK SURGERY  04/2013  . back surgury     f  . BLEPHAROPLASTY Bilateral 02/23/15  . CATARACT EXTRACTION    . COLONOSCOPY    . COLONOSCOPY N/A 10/02/2017   Procedure: COLONOSCOPY;  Surgeon: Doran Stabler, MD;  Location: [facility];  Service: Gastroenterology;  Laterality: N/A;  . COLONOSCOPY WITH PROPOFOL N/A 10/01/2017   Procedure: COLONOSCOPY WITH PROPOFOL;  Surgeon: Doran Stabler, MD;  Location: [facility];  Service: Gastroenterology;  Laterality: N/A;  . COLONOSCOPY WITH PROPOFOL N/A  03/07/2018   Procedure: COLONOSCOPY WITH PROPOFOL;  Surgeon: [name], MD;  Location: [facility];  Service: Endoscopy;  Laterality: N/A;  . ESOPHAGOGASTRODUODENOSCOPY (EGD) WITH PROPOFOL N/A 10/01/2017   Procedure: ESOPHAGOGASTRODUODENOSCOPY (EGD) WITH PROPOFOL;  Surgeon: Doran Stabler, MD;  Location: [facility];  Service: Gastroenterology;  Laterality: N/A;  . EYE SURGERY Bilateral   . FINGER SURGERY Right    right thumb  . HARDWARE REMOVAL Right 05/15/2016   Procedure: Right Lumbar Two-Lumbar Five Removal of Hardware;  Surgeon: Kristeen Miss, MD;  Location: [facility];  Service: Neurosurgery;  Laterality: Right;  Right L2-5 removal of hardware  . INGUINAL HERNIA REPAIR     x4  . IR RADIOLOGIST EVAL & MGMT  07/10/2018  . IR SACROPLASTY BILATERAL  07/15/2018  . REPAIR SPIGELIAN HERNIA  2011  . [place] ,2014  . TONSILLECTOMY    . UPPER GASTROINTESTINAL ENDOSCOPY    . VASECTOMY      reports that he has never smoked. He has never used smokeless tobacco. He reports that he does not drink alcohol or use drugs. Social History   Socioeconomic History  . Marital status: Married    Spouse name: [name]  . Number of children: 3  . Years of education: College  . Highest education level: Not on file  Occupational History  . 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 Financial resource strain: Not on file  . Food insecurity:    Worry: Not on file    Inability: Not on file  . Transportation needs:    Medical: Not on file    Non-medical: Not on file  Tobacco Use  . Smoking status: Never Smoker  . Smokeless tobacco: Never Used  Substance and Sexual Activity  . Alcohol use: No  . Drug use: No  . Sexual activity: Never  Lifestyle  . Physical activity:    Days per week: Not on file    Minutes per session: Not on file  . Stress: Not on file  Relationships  . Social connections:    Talks on phone: Not on file    Gets  together: Not on file    Attends religious service: Not on file    Active member of club or organization: Not on file    Attends meetings of clubs or organizations: Not on file    Relationship status: Not on file  . Intimate partner violence:    Fear of current or ex partner: Not on file    Emotionally abused: Not on file    Physically abused: Not on file    Forced sexual activity: Not on file  Other Topics Concern  . Not on file  Social History Narrative   Patient is married [name]).   Patient is a retired Arboriculturist.  Patient lives in [facility] living facility.   Patient has three adult children.   Patient does not drink any caffeine.   Patient is right-handed.    Functional Status Survey:    Family History  Problem Relation Age of Onset  . Prostate cancer Father   . CAD Mother   . Arthritis-Osteo Brother   . Colon cancer Neg Hx   . Stomach cancer Neg Hx     Health Maintenance  Topic Date Due  . INFLUENZA VACCINE  10/03/2018 (Originally 07/04/2018)  . PNA vac Low Risk Adult (1 of 2 - PCV13) 11/01/2018 (Originally 08/12/1998)  . TETANUS/TDAP  08/05/2019 (Originally 08/12/1950)  . FOOT EXAM  09/03/2019 (Originally 08/13/1941)  . OPHTHALMOLOGY EXAM  09/03/2019 (Originally 08/13/1941)  . URINE MICROALBUMIN  09/03/2019 (Originally 08/13/1941)  . HEMOGLOBIN A1C  Discontinued    Allergies  Allergen Reactions  . Celebrex [Celecoxib] Other (See Comments)    Created stomach ulcers.  Yvette Rack [Cyclobenzaprine] Other (See Comments)    Caught in hiatal hernia and caused extreme pain    Outpatient Encounter Medications as of 09/03/2018  Medication Sig  . Amino Acids-Protein Hydrolys (FEEDING SUPPLEMENT, PRO-STAT SUGAR FREE 64,) LIQD Take 30 mLs by mouth daily.  Marland Kitchen buPROPion (WELLBUTRIN SR) 150 MG 12 hr tablet Take 150 mg by mouth 2 (two) times daily.   . carboxymethylcellul-glycerin (REFRESH REPAIR) 0.5-0.9 % ophthalmic solution Place 1 drop into both eyes 3 (three) times daily.  .  cholecalciferol (VITAMIN D) 1000 UNITS tablet Take 1,000 Units by mouth daily.  Marland Kitchen docusate sodium (COLACE) 100 MG capsule Take 100 mg by mouth daily as needed for mild constipation.   . ferrous sulfate 325 (65 FE) MG EC tablet Take 325 mg by mouth daily with breakfast.  . gabapentin (NEURONTIN) 300 MG capsule Take 1 tablet PO at 4 PM  and 1 tablet at 10 PM  . HYDROcodone-acetaminophen (NORCO/VICODIN) 5-325 MG tablet Take 1 tablet by mouth every 6 (six) hours as needed for moderate pain.  Marland Kitchen ipratropium (ATROVENT) 0.03 % nasal spray Place 2 sprays into both nostrils 2 (two) times daily.   Marland Kitchen lovastatin (MEVACOR) 20 MG tablet Take 10 mg by mouth at bedtime.   . Magnesium 250 MG TABS Take 250 mg by mouth daily.   . methocarbamol (ROBAXIN) 500 MG tablet Take 500 mg by mouth every 6 (six) hours as needed for muscle spasms.   . Multiple Minerals-Vitamins (CITRACAL PLUS BONE DENSITY) TABS Take 1 tablet by mouth 2 (two) times daily.   . Multiple Vitamins-Minerals (CENTRUM ADULTS PO) Take 1 tablet by mouth daily.  . Multiple Vitamins-Minerals (PRESERVISION AREDS 2 PO) Take 1 capsule by mouth 2 (two) times daily.  Marland Kitchen omeprazole (PRILOSEC) 20 MG capsule Take 40 mg by mouth 2 (two) times daily.   . Polyethylene Glycol 3350 (MIRALAX PO) Take 1 Scoop by mouth daily.   . propranolol (INDERAL) 10 MG tablet Take 10 mg by mouth daily.   . ranitidine (ZANTAC) 300 MG tablet TAKE 1 TABLET(300 MG) BY MOUTH AT BEDTIME  . rOPINIRole (REQUIP) 4 MG tablet Take 4 mg by mouth at bedtime.  . Tamsulosin HCl (FLOMAX) 0.4 MG CAPS Take 0.4 mg by mouth 2 (two) times daily.   . traMADol (ULTRAM) 50 MG tablet Take 100 mg by mouth 2 (two) times daily as needed.   No facility-administered encounter medications on file as of 09/03/2018.     Review of Systems  Constitutional: Negative.   HENT: Negative.   Respiratory:  Negative.   Cardiovascular: Negative.   Gastrointestinal: Negative.   Genitourinary: Positive for hematuria.    Musculoskeletal: Negative.   Allergic/Immunologic: Negative.   Neurological: Positive for tremors.  Hematological: Negative.   Psychiatric/Behavioral: Positive for confusion.    Vitals:   09/03/18 1053  BP: 117/71  Pulse: 64  Resp: 20  Temp: 97.6 F (36.4 C)  TempSrc: Oral  SpO2: 96%  Weight: 153 lb 6.4 oz (69.6 kg)  Height: 5\' 6"  (1.676 m)   Body mass index is 24.76 kg/m. Physical Exam  Constitutional: He appears well-developed and well-nourished.  HENT:  Head: Normocephalic.  Nose: Nose normal.  Mouth/Throat: Oropharynx is clear and moist.  Eyes: Pupils are equal, round, and reactive to light. Conjunctivae and EOM are normal.  Cardiovascular: Normal rate and regular rhythm. Exam reveals no gallop.  Murmur heard. Pulmonary/Chest: Effort normal and breath sounds normal.  Abdominal: Soft. Bowel sounds are normal. He exhibits no distension. There is no tenderness.  Genitourinary:  Genitourinary Comments: Patient has an indwelling Foley cath grossly bloody urine in cath bag which was emptied is present.  Musculoskeletal:  Patient is seated in wheelchair and is currently undergoing therapy.  He is able to ambulate with a walker significant pain.  Skin: Skin is warm.  Psychiatric: He has a normal mood and affect. His behavior is normal.  There is some slowness of (patient has history of mild dementia)    Labs reviewed: Basic Metabolic Panel: Recent Labs    10/01/17 0525 10/03/17 2130  05/30/18  07/15/18 0704 07/18/18 08/06/18  NA 144 140   < > 138   < > 133* 135 137  K 3.7 3.8   < > 4.3   < > 4.2 4.4 4.0  CL 112* 109  --  104  --  100  --   --   CO2 23 23  --  26  --  22  --   --   GLUCOSE 100* 93  --   --   --  135*  --   --   BUN 13 13   < > 22*   < > 23 18 13   CREATININE 0.86 0.84   < > 0.8   < > 0.76 0.52 0.69  CALCIUM 8.9 9.2  --  8.6   < > 8.9 8.2 8.2   < > = values in this interval not displayed.   Liver Function Tests: Recent Labs    10/01/17 0525  05/30/18 07/08/18 08/06/18  AST 51* 32 102 27  ALT 24 16 67 11  ALKPHOS 62 112 122 90  BILITOT 0.7  --  0.5 0.3  PROT 6.9 6.2 6.3 5.3  ALBUMIN 3.9 3.3 3.3 2.8   No results for input(s): LIPASE, AMYLASE in the last 8760 hours. No results for input(s): AMMONIA in the last 8760 hours. CBC: Recent Labs    10/17/17 1100  02/28/18 1126  05/17/18 1524 05/30/18 07/08/18 07/15/18 0704 08/03/18 08/06/18 08/20/18  WBC 5.1   < > 6.1   < > 7.3 6.9 7.8 8.4 40-60 7.3 6.7  NEUTROABS 3.7  --  4.5  --  5.6  --   --   --   --   --   --   HGB 11.2*   < > 10.6*   < > 12.0* 11.9* 12.0 12.6*  --  10.3 10.4*  HCT 33.6*   < > 31.6*   < > 35.8* 36* 35.5 39.8  --  30.5 32*  MCV 91.0  --  89.5  --  87.7  --  85.7 90.2  --  85  --   PLT 223.0   < > 178.0   < > 197.0 312  --  311  --   --  259   < > = values in this interval not displayed.   Cardiac Enzymes: No results for input(s): CKTOTAL, CKMB, CKMBINDEX, TROPONINI in the last 8760 hours. BNP: Invalid input(s): POCBNP No results found for: HGBA1C Lab Results  Component Value Date   TSH 2.35 07/08/2018   Lab Results  Component Value Date   VITAMINB12 314 07/25/2013   Lab Results  Component Value Date   FOLATE >24.8 07/25/2013   Lab Results  Component Value Date   IRON 19 (L) 07/25/2013   FERRITIN 8.1 (L) 07/25/2013    Imaging and Procedures obtained prior to SNF admission: Ir Sacroplasty Bilateral  Result Date: 07/15/2018 INDICATION: 84 year old male with a history of bilateral sacral insufficiency fractures. EXAM: IR SACRALPLASTY INJ BILAT BILATERAL BONE BIOPSY OF THE SACRAL FRACTURE SITES COMPARISON:  CT 07/03/2018 MEDICATIONS: As antibiotic prophylaxis, 2 g Ancef was ordered pre-procedure and administered intravenously within 1 hour of incision. ANESTHESIA/SEDATION: Moderate (conscious) sedation was employed during this procedure. A total of Versed 1.0 mg and Fentanyl 37.5 mcg was administered intravenously. Moderate Sedation Time: 40  minutes. The patient's level of consciousness and vital signs were monitored continuously by radiology nursing throughout the procedure under my direct supervision. FLUOROSCOPY TIME:  Fluoroscopy Time: 7 minutes 0 seconds (7,169 mGy) COMPLICATIONS: None immediate. PROCEDURE: Following a full explanation of the procedure along with the potential associated complications, an informed witnessed consent was obtained. The patient was placed prone on the fluoroscopic table. The skin overlying the sacral region was then prepped and draped in the usual sterile fashion. The right sacral base was first addressed. 1% lidocaine was used for local anesthesia, to the level of the posterior cortex. The kypho on 13 gauge needle was then advanced under biplane fluoroscopy into the sacral base, parallel to the sacral base. Once we confirmed needle position, a core biopsy was retrieved with a 16 gauge device. Sample was placed into formalin. The left sacral base was then addressed. 1% lidocaine was used for local anesthesia, to the level of the posterior cortex. The selected Stryker jam she needle was then advanced under biplane fluoroscopy, parallel to the sacral base. Eighth 16 gauge core biopsy was then retrieved and placed into formalin. Methylmethacrylate cement was then infused starting on the left under biplane fluoroscopy. Left was first treated, and then right. Final images were stored. Patient tolerated the procedure well and remained hemodynamically stable throughout. No complications were encountered and no significant blood loss. IMPRESSION: Status post fluoroscopic-guided needle placement for deep core bone biopsy at bilateral sacral base. Status post vertebral body augmentation using methylmethacrylate at the bilateral sacral base fracture sites. Signed, Dulcy Fanny. Dellia Nims, RPVI [facility] Electronically Signed   By: Corrie Mckusick D.O.   On: 07/15/2018 11:36      Assessment/Plan 1. REM sleep behavior disorder Patient has obstructive sleep apnea and has been using CPAP for 15 years.  He tells me he cannot stay awake the day after he does not use it.  I am sure this helps control blood pressure and apnea as well  2. Mild cognitive impairment There is some cognitive impairment by history.  His wife who is present at the time of visit does  most of the talking but I asked him some questions specifically about his condition and prognosis and he answered appropriately.  He is on no medication for cognitive impairment.    3. History of back surgery He has had several back surgeries including laminectomy most recent was a plastic procedure on a fractured sacrum.  He appears to be pr physical therapy current time and making progress.  Ogressing, albeit slowly from that.  He is undergoing  5. Malignant neoplasm of prostate [facility]) This is followed by urology.  Cancer was treated by radiation therapy.  He suffered some radiation proctitis and that problem has been followed by gastroenterology and he received laser therapy in the past.   Family/ staff Communication: Patient and wife were told of results and demonstrate understanding  Labs/tests ordered:  Lillette Boxer. Sabra Heck, [place] [address] [place], [place] Office [phone]

## 2018-09-04 DIAGNOSIS — R29898 Other symptoms and signs involving the musculoskeletal system: Secondary | ICD-10-CM | POA: Diagnosis not present

## 2018-09-04 DIAGNOSIS — D649 Anemia, unspecified: Secondary | ICD-10-CM | POA: Diagnosis not present

## 2018-09-04 DIAGNOSIS — G4733 Obstructive sleep apnea (adult) (pediatric): Secondary | ICD-10-CM | POA: Diagnosis not present

## 2018-09-04 DIAGNOSIS — M6281 Muscle weakness (generalized): Secondary | ICD-10-CM | POA: Diagnosis not present

## 2018-09-04 DIAGNOSIS — F329 Major depressive disorder, single episode, unspecified: Secondary | ICD-10-CM | POA: Diagnosis not present

## 2018-09-04 DIAGNOSIS — M545 Low back pain: Secondary | ICD-10-CM | POA: Diagnosis not present

## 2018-09-05 ENCOUNTER — Ambulatory Visit
Admission: RE | Admit: 2018-09-05 | Discharge: 2018-09-05 | Disposition: A | Discharge status: Home / Self Care | Payer: Medicare Other | Source: Ambulatory Visit | Attending: Interventional Radiology | Admitting: Interventional Radiology

## 2018-09-05 ENCOUNTER — Encounter: Payer: Self-pay | Admitting: Radiology

## 2018-09-05 DIAGNOSIS — M545 Low back pain: Secondary | ICD-10-CM | POA: Diagnosis not present

## 2018-09-05 DIAGNOSIS — D649 Anemia, unspecified: Secondary | ICD-10-CM | POA: Diagnosis not present

## 2018-09-05 DIAGNOSIS — F329 Major depressive disorder, single episode, unspecified: Secondary | ICD-10-CM | POA: Diagnosis not present

## 2018-09-05 DIAGNOSIS — M8448XA Pathological fracture, other site, initial encounter for fracture: Secondary | ICD-10-CM

## 2018-09-05 DIAGNOSIS — M818 Other osteoporosis without current pathological fracture: Secondary | ICD-10-CM | POA: Diagnosis not present

## 2018-09-05 DIAGNOSIS — R29898 Other symptoms and signs involving the musculoskeletal system: Secondary | ICD-10-CM | POA: Diagnosis not present

## 2018-09-05 DIAGNOSIS — Z9889 Other specified postprocedural states: Secondary | ICD-10-CM | POA: Diagnosis not present

## 2018-09-05 DIAGNOSIS — G4733 Obstructive sleep apnea (adult) (pediatric): Secondary | ICD-10-CM | POA: Diagnosis not present

## 2018-09-05 DIAGNOSIS — M6281 Muscle weakness (generalized): Secondary | ICD-10-CM | POA: Diagnosis not present

## 2018-09-05 HISTORY — PX: IR RADIOLOGIST EVAL & MGMT: IMG5224

## 2018-09-05 NOTE — Progress Notes (Signed)
Chief Complaint: F/U after Sacroplasty  Referring Physician(s): Dr Ulyess Mort  Supervising Physician: Corrie Mckusick  History of Present Illness: Charles [name] is a 84 y.o. male with chronic back pain secondary to osteoporosis with a history of  2 or more back surgeries with implantation of hardware.  He had a fall at home in June and continued to have pain in his tailbone.  He was unable to get out of bed and he was even unable to roll over in bed.  He was seen in the ER in July and CT scan showed vertical and transverse sacral insufficiency fractures that are likely subacute.  He was seen initially by Dr. Pascal Lux on 07/10/2018 for evaluation of sacroplasty and was deemed a good candidate.  Dr. Earleen Newport performed the procedure on 07/15/2018.  He currently resides at [facility] facility and continues to make excellent progress with physical therapy.  He states he has no pain at all now and describes it as "Night and Day difference".  He ambulated with a walker with assistance because he hasn't quite regained all his strength in his legs but he and his wife feel he has made excellent progress.  He does c/o some soreness if he sits too long. He does have a donut cushion in his wheelchair.  He has no other complaints and ROS is negative.   Past Medical History:  Diagnosis Date  . Anemia   . Arthritis   . AVM (arteriovenous malformation)   . Barrett's esophagus   . BPH (benign prostatic hyperplasia)   . CAD (coronary artery disease)    only on stress test; never had MI, PCI or CABG.  In 2008, LVEF was reportedly normal. pt. denies  . Cataract   . Constipation   . CPAP (continuous positive airway pressure) dependence   . Depression   . Diverticulosis    diverticular bleeding  . Diverticulosis   . GERD (gastroesophageal reflux disease)   . History of radiation therapy   . Hyperlipidemia   . Hypoglycemia   . Lymphocytosis 06/2012  . Mastodynia   . Murmur    slight  . Nasal congestion   . Osteoporosis   . Osteoporosis   . Prostate cancer ([place])   . Sleep apnea    wears CPAP  . Urinary retention with incomplete bladder emptying    pt uses bathroom and within 30 mins needs to go again  . Vitamin D deficiency     Past Surgical History:  Procedure Laterality Date  . AMPUTATION TOE Right 09/20/2017   Procedure: Right 4th toe amputation;  Surgeon: Wylene Simmer, MD;  Location: [facility];  Service: Orthopedics;  Laterality: Right;  foot block  . BACK SURGERY  04/2013  . back surgury     f  . BLEPHAROPLASTY Bilateral 02/23/15  . CATARACT EXTRACTION    . COLONOSCOPY    . COLONOSCOPY N/A 10/02/2017   Procedure: COLONOSCOPY;  Surgeon: Doran Stabler, MD;  Location: [facility];  Service: Gastroenterology;  Laterality: N/A;  . COLONOSCOPY WITH PROPOFOL N/A 10/01/2017   Procedure: COLONOSCOPY WITH PROPOFOL;  Surgeon: Doran Stabler, MD;  Location: [facility];  Service: Gastroenterology;  Laterality: N/A;  . COLONOSCOPY WITH PROPOFOL N/A 03/07/2018   Procedure: COLONOSCOPY WITH PROPOFOL;  Surgeon: [name], MD;  Location: [facility];  Service: Endoscopy;  Laterality: N/A;  . ESOPHAGOGASTRODUODENOSCOPY (EGD) WITH PROPOFOL N/A 10/01/2017   Procedure: ESOPHAGOGASTRODUODENOSCOPY (EGD) WITH PROPOFOL;  Surgeon:  Doran Stabler, MD;  Location: [facility];  Service: Gastroenterology;  Laterality: N/A;  . EYE SURGERY Bilateral   . FINGER SURGERY Right    right thumb  . HARDWARE REMOVAL Right 05/15/2016   Procedure: Right Lumbar Two-Lumbar Five Removal of Hardware;  Surgeon: Kristeen Miss, MD;  Location: [facility];  Service: Neurosurgery;  Laterality: Right;  Right L2-5 removal of hardware  . INGUINAL HERNIA REPAIR     x4  . IR RADIOLOGIST EVAL & MGMT  07/10/2018  . IR SACROPLASTY BILATERAL  07/15/2018  . REPAIR SPIGELIAN HERNIA  2011  . [place] ,2014  . TONSILLECTOMY    . UPPER GASTROINTESTINAL ENDOSCOPY     . VASECTOMY      Allergies: Celebrex [celecoxib] and Flexeril [cyclobenzaprine]  Medications: Prior to Admission medications   Medication Sig Start Date End Date Taking? Authorizing Provider  Amino Acids-Protein Hydrolys (FEEDING SUPPLEMENT, PRO-STAT SUGAR FREE 64,) LIQD Take 30 mLs by mouth daily.   Yes [provider]  buPROPion (WELLBUTRIN SR) 150 MG 12 hr tablet Take 150 mg by mouth 2 (two) times daily.  06/28/11  Yes [provider]  carboxymethylcellul-glycerin (REFRESH REPAIR) 0.5-0.9 % ophthalmic solution Place 1 drop into both eyes 3 (three) times daily.   Yes [provider]  cholecalciferol (VITAMIN D) 1000 UNITS tablet Take 1,000 Units by mouth daily.   Yes [provider]  docusate sodium (COLACE) 100 MG capsule Take 100 mg by mouth daily as needed for mild constipation.    Yes [provider]  ferrous sulfate 325 (65 FE) MG EC tablet Take 325 mg by mouth daily with breakfast.   Yes [provider]  gabapentin (NEURONTIN) 300 MG capsule Take 1 tablet PO at 4 PM  and 1 tablet at 10 PM 06/20/16  Yes Millikan, Megan, NP  ipratropium (ATROVENT) 0.03 % nasal spray Place 2 sprays into both nostrils 2 (two) times daily.    Yes [provider]  lovastatin (MEVACOR) 20 MG tablet Take 10 mg by mouth at bedtime.  07/22/12  Yes [provider]  Magnesium 250 MG TABS Take 250 mg by mouth daily.    Yes [provider]  methocarbamol (ROBAXIN) 500 MG tablet Take 500 mg by mouth every 6 (six) hours as needed for muscle spasms.    Yes [provider]  Multiple Minerals-Vitamins (CITRACAL PLUS BONE DENSITY) TABS Take 1 tablet by mouth 2 (two) times daily.    Yes [provider]  Multiple Vitamins-Minerals (CENTRUM ADULTS PO) Take 1 tablet by mouth daily.   Yes [provider]  Multiple Vitamins-Minerals (PRESERVISION AREDS 2 PO) Take 1 capsule by mouth 2 (two) times daily.   Yes [provider]  omeprazole (PRILOSEC) 20 MG capsule Take 40 mg by mouth 2 (two) times daily.    Yes [provider]  Polyethylene Glycol 3350 (MIRALAX PO) Take 1 Scoop by mouth daily.    Yes [provider]  propranolol (INDERAL) 10 MG tablet Take 10 mg by mouth daily.    Yes [provider]  ranitidine (ZANTAC) 300 MG tablet TAKE 1 TABLET(300 MG) BY MOUTH AT BEDTIME 02/25/18  Yes [name], MD  rOPINIRole (REQUIP) 4 MG tablet Take 4 mg by mouth at bedtime.   Yes [provider]  Tamsulosin HCl (FLOMAX) 0.4 MG CAPS Take 0.4 mg by mouth 2 (two) times daily.  08/30/11  Yes [provider]  traMADol (ULTRAM) 50 MG  tablet Take 100 mg by mouth 2 (two) times daily as needed.   Yes [provider]  HYDROcodone-acetaminophen (NORCO/VICODIN) 5-325 MG tablet Take 1 tablet by mouth every 6 (six) hours as needed for moderate pain.    [provider]     Family History  Problem Relation Age of Onset  . Prostate cancer Father   . CAD Mother   . Arthritis-Osteo Brother   . Colon cancer Neg Hx   . Stomach cancer Neg Hx     Social History   Socioeconomic History  . Marital status: Married    Spouse name: [name]  . Number of children: 3  . Years of education: College  . Highest education level: Not on file  Occupational History  . 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 Financial resource strain: Not on file  . Food insecurity:    Worry: Not on file    Inability: Not on file  . Transportation needs:    Medical: Not on file    Non-medical: Not on file  Tobacco Use  . Smoking status: Never Smoker  . Smokeless tobacco: Never Used  Substance and Sexual Activity  . Alcohol use: No  . Drug use: No  . Sexual activity: Never  Lifestyle  . Physical activity:    Days per week: Not on file    Minutes per session: Not on file  . Stress: Not on file  Relationships  . Social  connections:    Talks on phone: Not on file    Gets together: Not on file    Attends religious service: Not on file    Active member of club or organization: Not on file    Attends meetings of clubs or organizations: Not on file    Relationship status: Not on file  Other Topics Concern  . Not on file  Social History Narrative   Patient is married [name]).   Patient is a retired Arboriculturist.   Patient lives in [facility] facility.   Patient has three adult children.   Patient does not drink any caffeine.   Patient is right-handed.     Review of Systems: A 12 point ROS discussed and pertinent positives are indicated in the HPI above.  All other systems are negative. Review of Systems  Vital Signs: BP 136/60   Pulse 65   Temp 98.1 F (36.7 C) (Oral)   Resp 15   Ht 5\' 7"  (1.702 m)   Wt 70.3 kg   SpO2 98%   BMI 24.28 kg/m   Physical Exam  Constitutional: He is oriented to person, place, and time. He appears well-developed.  HENT:  Head: Normocephalic and atraumatic.  Eyes: EOM are normal.  Neck: Normal range of motion.  Cardiovascular: Normal rate, regular rhythm and normal heart sounds.  Pulmonary/Chest: Effort normal and breath sounds normal. No respiratory distress.  Abdominal: Soft. He exhibits no distension. There is no tenderness.  Musculoskeletal:  Ambulates with a walker. Can ambulate 200 feet or more with a walker and with assistance.  Neurological: He is alert and oriented to person, place, and time.  Skin:  No buttock or sacral skin breakdown seen  Psychiatric: He has a normal mood and affect. His behavior is normal. Judgment and thought content normal.  Vitals reviewed.   Imaging:  INDICATION: 84 year old male with a history of bilateral sacral insufficiency fractures.  EXAM: IR SACRALPLASTY INJ BILAT  BILATERAL BONE  BIOPSY OF THE SACRAL FRACTURE SITES  COMPARISON:  CT 07/03/2018  MEDICATIONS: As antibiotic prophylaxis, 2 g Ancef was  ordered pre-procedure and administered intravenously within 1 hour of incision.  ANESTHESIA/SEDATION: Moderate (conscious) sedation was employed during this procedure. A total of Versed 1.0 mg and Fentanyl 37.5 mcg was administered intravenously.  Moderate Sedation Time: 40 minutes. The patient's level of consciousness and vital signs were monitored continuously by radiology nursing throughout the procedure under my direct supervision.  FLUOROSCOPY TIME:  Fluoroscopy Time: 7 minutes 0 seconds (1,751 mGy)  COMPLICATIONS: None immediate.  PROCEDURE: Following a full explanation of the procedure along with the potential associated complications, an informed witnessed consent was obtained.  The patient was placed prone on the fluoroscopic table. The skin overlying the sacral region was then prepped and draped in the usual sterile fashion.  The right sacral base was first addressed. 1% lidocaine was used for local anesthesia, to the level of the posterior cortex.  The kypho on 13 gauge needle was then advanced under biplane fluoroscopy into the sacral base, parallel to the sacral base. Once we confirmed needle position, a core biopsy was retrieved with a 16 gauge device. Sample was placed into formalin.  The left sacral base was then addressed. 1% lidocaine was used for local anesthesia, to the level of the posterior cortex.  The selected Stryker jam she needle was then advanced under biplane fluoroscopy, parallel to the sacral base.  Eighth 16 gauge core biopsy was then retrieved and placed into formalin.  Methylmethacrylate cement was then infused starting on the left under biplane fluoroscopy. Left was first treated, and then right. Final images were stored.  Patient tolerated the procedure well and remained hemodynamically stable throughout.  No complications were encountered and no significant blood loss.  IMPRESSION: Status post  fluoroscopic-guided needle placement for deep core bone biopsy at bilateral sacral base.  Status post vertebral body augmentation using methylmethacrylate at the bilateral sacral base fracture sites.  Signed,  Dulcy Fanny. Dellia Nims, RPVI  [facility]  [facility]   Electronically Signed   By: Corrie Mckusick D.O.   On: 07/15/2018 11:36   Labs:  CBC: Recent Labs    05/17/18 1524 05/30/18 07/08/18 07/15/18 0704 08/03/18 08/06/18 08/20/18  WBC 7.3 6.9 7.8 8.4 40-60 7.3 6.7  HGB 12.0* 11.9* 12.0 12.6*  --  10.3 10.4*  HCT 35.8* 36* 35.5 39.8  --  30.5 32*  PLT 197.0 312  --  311  --   --  259    COAGS: Recent Labs    09/29/17 1941 10/03/17 2038 07/15/18 0704  INR 1.07 0.95 1.10    BMP: Recent Labs    09/30/17 0304 10/01/17 0525 10/03/17 2130  05/30/18 07/08/18 07/15/18 0704 07/18/18 08/06/18  NA 137 144 140   < > 138 132 133* 135 137  K 3.8 3.7 3.8   < > 4.3 4.4 4.2 4.4 4.0  CL 107 112* 109  --  104  --  100  --   --   CO2 24 23 23   --  26  --  22  --   --   GLUCOSE 104* 100* 93  --   --   --  135*  --   --   BUN 21* 13 13   < > 22* 22* 23 18 13   CALCIUM 8.7* 8.9 9.2   < > 8.6 8.4 8.9 8.2 8.2  CREATININE 0.88 0.86 0.84   < >  0.8 0.65 0.76 0.52 0.69  GFRNONAA >60 >60 >60  --  83 89 >60  --   --   GFRAA >60 >60 >60  --   --   --  >60  --   --    < > = values in this interval not displayed.    LIVER FUNCTION TESTS: Recent Labs    09/29/17 1941 10/01/17 0525 05/30/18 07/08/18 08/06/18  BILITOT 0.4 0.7  --  0.5 0.3  AST 53* 51* 32 102 27  ALT 23 24 16  67 11  ALKPHOS 76 62 112 122 90  PROT 6.8 6.9 6.2 6.3 5.3  ALBUMIN 3.8 3.9 3.3 3.3 2.8    TUMOR MARKERS: No results for input(s): AFPTM, CEA, CA199, CHROMGRNA in the last 8760 hours.  Assessment:  Vertical and transverse sacral insufficiency fractures.  S/P Bilateral sacroplasty by Dr. Earleen Newport on 07/15/2018 = doing excellent and making progress with  PT.   Plan: Continue physical therapy as tolerated.  Return PRN  Electronically Signed: Murrell Redden PA-C 09/05/2018, 10:49 AM    Please refer to Dr. Rickey Primus attestation of this note for management and plan.

## 2018-09-09 DIAGNOSIS — M545 Low back pain: Secondary | ICD-10-CM | POA: Diagnosis not present

## 2018-09-09 DIAGNOSIS — R29898 Other symptoms and signs involving the musculoskeletal system: Secondary | ICD-10-CM | POA: Diagnosis not present

## 2018-09-09 DIAGNOSIS — G4733 Obstructive sleep apnea (adult) (pediatric): Secondary | ICD-10-CM | POA: Diagnosis not present

## 2018-09-09 DIAGNOSIS — F329 Major depressive disorder, single episode, unspecified: Secondary | ICD-10-CM | POA: Diagnosis not present

## 2018-09-09 DIAGNOSIS — D649 Anemia, unspecified: Secondary | ICD-10-CM | POA: Diagnosis not present

## 2018-09-09 DIAGNOSIS — M6281 Muscle weakness (generalized): Secondary | ICD-10-CM | POA: Diagnosis not present

## 2018-09-10 DIAGNOSIS — M545 Low back pain: Secondary | ICD-10-CM | POA: Diagnosis not present

## 2018-09-10 DIAGNOSIS — R29898 Other symptoms and signs involving the musculoskeletal system: Secondary | ICD-10-CM | POA: Diagnosis not present

## 2018-09-10 DIAGNOSIS — D649 Anemia, unspecified: Secondary | ICD-10-CM | POA: Diagnosis not present

## 2018-09-10 DIAGNOSIS — G4733 Obstructive sleep apnea (adult) (pediatric): Secondary | ICD-10-CM | POA: Diagnosis not present

## 2018-09-10 DIAGNOSIS — M6281 Muscle weakness (generalized): Secondary | ICD-10-CM | POA: Diagnosis not present

## 2018-09-10 DIAGNOSIS — F329 Major depressive disorder, single episode, unspecified: Secondary | ICD-10-CM | POA: Diagnosis not present

## 2018-09-11 DIAGNOSIS — D649 Anemia, unspecified: Secondary | ICD-10-CM | POA: Diagnosis not present

## 2018-09-11 DIAGNOSIS — R29898 Other symptoms and signs involving the musculoskeletal system: Secondary | ICD-10-CM | POA: Diagnosis not present

## 2018-09-11 DIAGNOSIS — M6281 Muscle weakness (generalized): Secondary | ICD-10-CM | POA: Diagnosis not present

## 2018-09-11 DIAGNOSIS — G4733 Obstructive sleep apnea (adult) (pediatric): Secondary | ICD-10-CM | POA: Diagnosis not present

## 2018-09-11 DIAGNOSIS — F329 Major depressive disorder, single episode, unspecified: Secondary | ICD-10-CM | POA: Diagnosis not present

## 2018-09-11 DIAGNOSIS — M545 Low back pain: Secondary | ICD-10-CM | POA: Diagnosis not present

## 2018-09-12 DIAGNOSIS — G4733 Obstructive sleep apnea (adult) (pediatric): Secondary | ICD-10-CM | POA: Diagnosis not present

## 2018-09-12 DIAGNOSIS — D649 Anemia, unspecified: Secondary | ICD-10-CM | POA: Diagnosis not present

## 2018-09-12 DIAGNOSIS — R29898 Other symptoms and signs involving the musculoskeletal system: Secondary | ICD-10-CM | POA: Diagnosis not present

## 2018-09-12 DIAGNOSIS — M6281 Muscle weakness (generalized): Secondary | ICD-10-CM | POA: Diagnosis not present

## 2018-09-12 DIAGNOSIS — M545 Low back pain: Secondary | ICD-10-CM | POA: Diagnosis not present

## 2018-09-12 DIAGNOSIS — F329 Major depressive disorder, single episode, unspecified: Secondary | ICD-10-CM | POA: Diagnosis not present

## 2018-09-16 DIAGNOSIS — R3914 Feeling of incomplete bladder emptying: Secondary | ICD-10-CM | POA: Diagnosis not present

## 2018-09-17 DIAGNOSIS — F329 Major depressive disorder, single episode, unspecified: Secondary | ICD-10-CM | POA: Diagnosis not present

## 2018-09-17 DIAGNOSIS — G4733 Obstructive sleep apnea (adult) (pediatric): Secondary | ICD-10-CM | POA: Diagnosis not present

## 2018-09-17 DIAGNOSIS — M6281 Muscle weakness (generalized): Secondary | ICD-10-CM | POA: Diagnosis not present

## 2018-09-17 DIAGNOSIS — M545 Low back pain: Secondary | ICD-10-CM | POA: Diagnosis not present

## 2018-09-17 DIAGNOSIS — D649 Anemia, unspecified: Secondary | ICD-10-CM | POA: Diagnosis not present

## 2018-09-17 DIAGNOSIS — R29898 Other symptoms and signs involving the musculoskeletal system: Secondary | ICD-10-CM | POA: Diagnosis not present

## 2018-09-18 DIAGNOSIS — F329 Major depressive disorder, single episode, unspecified: Secondary | ICD-10-CM | POA: Diagnosis not present

## 2018-09-18 DIAGNOSIS — D649 Anemia, unspecified: Secondary | ICD-10-CM | POA: Diagnosis not present

## 2018-09-18 DIAGNOSIS — G4733 Obstructive sleep apnea (adult) (pediatric): Secondary | ICD-10-CM | POA: Diagnosis not present

## 2018-09-18 DIAGNOSIS — M6281 Muscle weakness (generalized): Secondary | ICD-10-CM | POA: Diagnosis not present

## 2018-09-18 DIAGNOSIS — R29898 Other symptoms and signs involving the musculoskeletal system: Secondary | ICD-10-CM | POA: Diagnosis not present

## 2018-09-18 DIAGNOSIS — M545 Low back pain: Secondary | ICD-10-CM | POA: Diagnosis not present

## 2018-09-19 DIAGNOSIS — M545 Low back pain: Secondary | ICD-10-CM | POA: Diagnosis not present

## 2018-09-19 DIAGNOSIS — M6281 Muscle weakness (generalized): Secondary | ICD-10-CM | POA: Diagnosis not present

## 2018-09-19 DIAGNOSIS — F329 Major depressive disorder, single episode, unspecified: Secondary | ICD-10-CM | POA: Diagnosis not present

## 2018-09-19 DIAGNOSIS — D649 Anemia, unspecified: Secondary | ICD-10-CM | POA: Diagnosis not present

## 2018-09-19 DIAGNOSIS — R29898 Other symptoms and signs involving the musculoskeletal system: Secondary | ICD-10-CM | POA: Diagnosis not present

## 2018-09-19 DIAGNOSIS — G4733 Obstructive sleep apnea (adult) (pediatric): Secondary | ICD-10-CM | POA: Diagnosis not present

## 2018-09-20 DIAGNOSIS — G4733 Obstructive sleep apnea (adult) (pediatric): Secondary | ICD-10-CM | POA: Diagnosis not present

## 2018-09-20 DIAGNOSIS — M545 Low back pain: Secondary | ICD-10-CM | POA: Diagnosis not present

## 2018-09-20 DIAGNOSIS — D649 Anemia, unspecified: Secondary | ICD-10-CM | POA: Diagnosis not present

## 2018-09-20 DIAGNOSIS — M6281 Muscle weakness (generalized): Secondary | ICD-10-CM | POA: Diagnosis not present

## 2018-09-20 DIAGNOSIS — R29898 Other symptoms and signs involving the musculoskeletal system: Secondary | ICD-10-CM | POA: Diagnosis not present

## 2018-09-20 DIAGNOSIS — F329 Major depressive disorder, single episode, unspecified: Secondary | ICD-10-CM | POA: Diagnosis not present

## 2018-09-23 DIAGNOSIS — D649 Anemia, unspecified: Secondary | ICD-10-CM | POA: Diagnosis not present

## 2018-09-23 DIAGNOSIS — M545 Low back pain: Secondary | ICD-10-CM | POA: Diagnosis not present

## 2018-09-23 DIAGNOSIS — R29898 Other symptoms and signs involving the musculoskeletal system: Secondary | ICD-10-CM | POA: Diagnosis not present

## 2018-09-23 DIAGNOSIS — M6281 Muscle weakness (generalized): Secondary | ICD-10-CM | POA: Diagnosis not present

## 2018-09-23 DIAGNOSIS — F329 Major depressive disorder, single episode, unspecified: Secondary | ICD-10-CM | POA: Diagnosis not present

## 2018-09-23 DIAGNOSIS — G4733 Obstructive sleep apnea (adult) (pediatric): Secondary | ICD-10-CM | POA: Diagnosis not present

## 2018-09-23 DIAGNOSIS — C61 Malignant neoplasm of prostate: Secondary | ICD-10-CM | POA: Diagnosis not present

## 2018-09-23 DIAGNOSIS — R338 Other retention of urine: Secondary | ICD-10-CM | POA: Diagnosis not present

## 2018-09-24 ENCOUNTER — Non-Acute Institutional Stay (SKILLED_NURSING_FACILITY): Payer: Medicare Other

## 2018-09-24 DIAGNOSIS — F329 Major depressive disorder, single episode, unspecified: Secondary | ICD-10-CM | POA: Diagnosis not present

## 2018-09-24 DIAGNOSIS — Z0000 Encounter for general adult medical examination without abnormal findings: Secondary | ICD-10-CM | POA: Diagnosis not present

## 2018-09-24 DIAGNOSIS — M545 Low back pain: Secondary | ICD-10-CM | POA: Diagnosis not present

## 2018-09-24 DIAGNOSIS — D649 Anemia, unspecified: Secondary | ICD-10-CM | POA: Diagnosis not present

## 2018-09-24 DIAGNOSIS — R29898 Other symptoms and signs involving the musculoskeletal system: Secondary | ICD-10-CM | POA: Diagnosis not present

## 2018-09-24 DIAGNOSIS — M6281 Muscle weakness (generalized): Secondary | ICD-10-CM | POA: Diagnosis not present

## 2018-09-24 DIAGNOSIS — G4733 Obstructive sleep apnea (adult) (pediatric): Secondary | ICD-10-CM | POA: Diagnosis not present

## 2018-09-24 NOTE — Patient Instructions (Addendum)
[name] , Thank you for taking time to come for your Medicare Wellness Visit. I appreciate your ongoing commitment to your health goals. Please review the following plan we discussed and let me know if I can assist you in the future.   Screening recommendations/referrals: Colonoscopy excluded, over age 82 Recommended yearly ophthalmology/optometry visit for glaucoma screening and checkup Recommended yearly dental visit for hygiene and checkup  Vaccinations: Influenza vaccine up to date, completed Pneumococcal vaccine up to date, completed Tdap vaccine due Shingles vaccine up to date, completed    Advanced directives: in chart  Conditions/risks identified: fall risk  Next appointment: Dr. Henrene Hawking makes rounds  Preventive Care 50 Years and Older, Male Preventive care refers to lifestyle choices and visits with your health care provider that can promote health and wellness. What does preventive care include?  A yearly physical exam. This is also called an annual well check.  Dental exams once or twice a year.  Routine eye exams. Ask your health care provider how often you should have your eyes checked.  Personal lifestyle choices, including:  Daily care of your teeth and gums.  Regular physical activity.  Eating a healthy diet.  Avoiding tobacco and drug use.  Limiting alcohol use.  Practicing safe sex.  Taking low doses of aspirin every day.  Taking vitamin and mineral supplements as recommended by your health care provider. What happens during an annual well check? The services and screenings done by your health care provider during your annual well check will depend on your age, overall health, lifestyle risk factors, and family history of disease. Counseling  Your health care provider may ask you questions about your:  Alcohol use.  Tobacco use.  Drug use.  Emotional well-being.  Home and relationship well-being.  Sexual activity.  Eating  habits.  History of falls.  Memory and ability to understand (cognition).  Work and work Statistician. Screening  You may have the following tests or measurements:  Height, weight, and BMI.  Blood pressure.  Lipid and cholesterol levels. These may be checked every 5 years, or more frequently if you are over 62 years old.  Skin check.  Lung cancer screening. You may have this screening every year starting at age 39 if you have a 30-pack-year history of smoking and currently smoke or have quit within the past 15 years.  Fecal occult blood test (FOBT) of the stool. You may have this test every year starting at age 13.  Flexible sigmoidoscopy or colonoscopy. You may have a sigmoidoscopy every 5 years or a colonoscopy every 10 years starting at age 26.  Prostate cancer screening. Recommendations will vary depending on your family history and other risks.  Hepatitis C blood test.  Hepatitis B blood test.  Sexually transmitted disease (STD) testing.  Diabetes screening. This is done by checking your blood sugar (glucose) after you have not eaten for a while (fasting). You may have this done every 1-3 years.  Abdominal aortic aneurysm (AAA) screening. You may need this if you are a current or former smoker.  Osteoporosis. You may be screened starting at age 82 if you are at high risk. Talk with your health care provider about your test results, treatment options, and if necessary, the need for more tests. Vaccines  Your health care provider may recommend certain vaccines, such as:  Influenza vaccine. This is recommended every year.  Tetanus, diphtheria, and acellular pertussis (Tdap, Td) vaccine. You may need a Td booster every 10 years.  Zoster vaccine. You may need this after age 61.  Pneumococcal 13-valent conjugate (PCV13) vaccine. One dose is recommended after age 41.  Pneumococcal polysaccharide (PPSV23) vaccine. One dose is recommended after age 64. Talk to your health  care provider about which screenings and vaccines you need and how often you need them. This information is not intended to replace advice given to you by your health care provider. Make sure you discuss any questions you have with your health care provider. Document Released: 12/17/2015 Document Revised: 08/09/2016 Document Reviewed: 09/21/2015 Elsevier Interactive Patient Education  2017 [place] Prevention in the Home Falls can cause injuries. They can happen to people of all ages. There are many things you can do to make your home safe and to help prevent falls. What can I do on the outside of my home?  Regularly fix the edges of walkways and driveways and fix any cracks.  Remove anything that might make you trip as you walk through a door, such as a raised step or threshold.  Trim any bushes or trees on the path to your home.  Use bright outdoor lighting.  Clear any walking paths of anything that might make someone trip, such as rocks or tools.  Regularly check to see if handrails are loose or broken. Make sure that both sides of any steps have handrails.  Any raised decks and porches should have guardrails on the edges.  Have any leaves, snow, or ice cleared regularly.  Use sand or salt on walking paths during winter.  Clean up any spills in your garage right away. This includes oil or grease spills. What can I do in the bathroom?  Use night lights.  Install grab bars by the toilet and in the tub and shower. Do not use towel bars as grab bars.  Use non-skid mats or decals in the tub or shower.  If you need to sit down in the shower, use a plastic, non-slip stool.  Keep the floor dry. Clean up any water that spills on the floor as soon as it happens.  Remove soap buildup in the tub or shower regularly.  Attach bath mats securely with double-sided non-slip rug tape.  Do not have throw rugs and other things on the floor that can make you trip. What can I do  in the bedroom?  Use night lights.  Make sure that you have a light by your bed that is easy to reach.  Do not use any sheets or blankets that are too big for your bed. They should not hang down onto the floor.  Have a firm chair that has side arms. You can use this for support while you get dressed.  Do not have throw rugs and other things on the floor that can make you trip. What can I do in the kitchen?  Clean up any spills right away.  Avoid walking on wet floors.  Keep items that you use a lot in easy-to-reach places.  If you need to reach something above you, use a strong step stool that has a grab bar.  Keep electrical cords out of the way.  Do not use floor polish or wax that makes floors slippery. If you must use wax, use non-skid floor wax.  Do not have throw rugs and other things on the floor that can make you trip. What can I do with my stairs?  Do not leave any items on the stairs.  Make sure that there are  handrails on both sides of the stairs and use them. Fix handrails that are broken or loose. Make sure that handrails are as long as the stairways.  Check any carpeting to make sure that it is firmly attached to the stairs. Fix any carpet that is loose or worn.  Avoid having throw rugs at the top or bottom of the stairs. If you do have throw rugs, attach them to the floor with carpet tape.  Make sure that you have a light switch at the top of the stairs and the bottom of the stairs. If you do not have them, ask someone to add them for you. What else can I do to help prevent falls?  Wear shoes that:  Do not have high heels.  Have rubber bottoms.  Are comfortable and fit you well.  Are closed at the toe. Do not wear sandals.  If you use a stepladder:  Make sure that it is fully opened. Do not climb a closed stepladder.  Make sure that both sides of the stepladder are locked into place.  Ask someone to hold it for you, if possible.  Clearly mark  and make sure that you can see:  Any grab bars or handrails.  First and last steps.  Where the edge of each step is.  Use tools that help you move around (mobility aids) if they are needed. These include:  Canes.  Walkers.  Scooters.  Crutches.  Turn on the lights when you go into a dark area. Replace any light bulbs as soon as they burn out.  Set up your furniture so you have a clear path. Avoid moving your furniture around.  If any of your floors are uneven, fix them.  If there are any pets around you, be aware of where they are.  Review your medicines with your doctor. Some medicines can make you feel dizzy. This can increase your chance of falling. Ask your doctor what other things that you can do to help prevent falls. This information is not intended to replace advice given to you by your health care provider. Make sure you discuss any questions you have with your health care provider. Document Released: 09/16/2009 Document Revised: 04/27/2016 Document Reviewed: 12/25/2014 Elsevier Interactive Patient Education  2017 [employer].

## 2018-09-24 NOTE — Progress Notes (Signed)
Subjective:   Charles [name] is a 84 y.o. male who presents for Medicare Annual/Subsequent preventive examination at [facility]  Last AWV-08/07/2017    Objective:    Vitals: BP 126/74 (BP Location: Left Arm, Patient Position: Sitting)   Pulse 64   Temp 98 F (36.7 C) (Oral)   Ht 5\' 7"  (1.702 m)   Wt 155 lb (70.3 kg)   BMI 24.28 kg/m   Body mass index is 24.28 kg/m.  Advanced Directives 09/24/2018 09/03/2018 08/28/2018 08/07/2018 08/02/2018 07/15/2018 07/09/2018  Does Patient Have a Medical Advance Directive? Yes Yes Yes Yes Yes Yes Yes  Type of Paramedic of [place];Living will [place];Living will [place];Living will Out of facility DNR (pink MOST or yellow form);Living will [place];Living will [place];Living will [place];Living will  Does patient want to make changes to medical advance directive? No - Patient declined - - - - No - Patient declined -  Copy of [place] in Chart? Yes Yes Yes Yes Yes No - copy requested Yes  Would patient like information on creating a medical advance directive? - - - - - - -    Tobacco Social History   Tobacco Use  Smoking Status Never Smoker  Smokeless Tobacco Never Used     Counseling given: Not Answered   Clinical Intake:  Pre-visit preparation completed: No  Pain : No/denies pain     Diabetes: No  How often do you need to have someone help you when you read instructions, pamphlets, or other written materials from your doctor or pharmacy?: 2 - Rarely What is the last grade level you completed in school?: college  Interpreter Needed?: No  Information entered by :: Tyson Dense, RN  Past Medical History:  Diagnosis Date  . Anemia   . Arthritis   . AVM (arteriovenous malformation)   . Barrett's esophagus   . BPH (benign prostatic hyperplasia)   . CAD  (coronary artery disease)    only on stress test; never had MI, PCI or CABG.  In 2008, LVEF was reportedly normal. pt. denies  . Cataract   . Constipation   . CPAP (continuous positive airway pressure) dependence   . Depression   . Diverticulosis    diverticular bleeding  . Diverticulosis   . GERD (gastroesophageal reflux disease)   . History of radiation therapy   . Hyperlipidemia   . Hypoglycemia   . Lymphocytosis 06/2012  . Mastodynia   . Murmur    slight  . Nasal congestion   . Osteoporosis   . Osteoporosis   . Prostate cancer ([place])   . Sleep apnea    wears CPAP  . Urinary retention with incomplete bladder emptying    pt uses bathroom and within 30 mins needs to go again  . Vitamin D deficiency    Past Surgical History:  Procedure Laterality Date  . AMPUTATION TOE Right 09/20/2017   Procedure: Right 4th toe amputation;  Surgeon: Wylene Simmer, MD;  Location: [facility];  Service: Orthopedics;  Laterality: Right;  foot block  . BACK SURGERY  04/2013  . back surgury     f  . BLEPHAROPLASTY Bilateral 02/23/15  . CATARACT EXTRACTION    . COLONOSCOPY    . COLONOSCOPY N/A 10/02/2017   Procedure: COLONOSCOPY;  Surgeon: Doran Stabler, MD;  Location: [facility];  Service: Gastroenterology;  Laterality: N/A;  . COLONOSCOPY WITH PROPOFOL N/A 10/01/2017   Procedure: COLONOSCOPY WITH PROPOFOL;  Surgeon: Doran Stabler, MD;  Location: [facility];  Service: Gastroenterology;  Laterality: N/A;  . COLONOSCOPY WITH PROPOFOL N/A 03/07/2018   Procedure: COLONOSCOPY WITH PROPOFOL;  Surgeon: [name], MD;  Location: [facility];  Service: Endoscopy;  Laterality: N/A;  . ESOPHAGOGASTRODUODENOSCOPY (EGD) WITH PROPOFOL N/A 10/01/2017   Procedure: ESOPHAGOGASTRODUODENOSCOPY (EGD) WITH PROPOFOL;  Surgeon: Doran Stabler, MD;  Location: [facility];  Service: Gastroenterology;  Laterality: N/A;  . EYE SURGERY Bilateral   . FINGER SURGERY Right    right  thumb  . HARDWARE REMOVAL Right 05/15/2016   Procedure: Right Lumbar Two-Lumbar Five Removal of Hardware;  Surgeon: Kristeen Miss, MD;  Location: [facility];  Service: Neurosurgery;  Laterality: Right;  Right L2-5 removal of hardware  . INGUINAL HERNIA REPAIR     x4  . IR RADIOLOGIST EVAL & MGMT  07/10/2018  . IR RADIOLOGIST EVAL & MGMT  09/05/2018  . IR SACROPLASTY BILATERAL  07/15/2018  . REPAIR SPIGELIAN HERNIA  2011  . [place] ,2014  . TONSILLECTOMY    . UPPER GASTROINTESTINAL ENDOSCOPY    . VASECTOMY     Family History  Problem Relation Age of Onset  . Prostate cancer Father   . CAD Mother   . Arthritis-Osteo Brother   . Colon cancer Neg Hx   . Stomach cancer Neg Hx    Social History   Socioeconomic History  . Marital status: Married    Spouse name: [name]  . Number of children: 3  . Years of education: College  . Highest education level: Not on file  Occupational History  . 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 Financial resource strain: Not hard at all  . Food insecurity:    Worry: Never true    Inability: Never true  . Transportation needs:    Medical: No    Non-medical: No  Tobacco Use  . Smoking status: Never Smoker  . Smokeless tobacco: Never Used  Substance and Sexual Activity  . Alcohol use: No  . Drug use: No  . Sexual activity: Never  Lifestyle  . Physical activity:    Days per week: 7 days    Minutes per session: 10 min  . Stress: Not at all  Relationships  . Social connections:    Talks on phone: More than three times a week    Gets together: More than three times a week    Attends religious service: Never    Active member of club or organization: No    Attends meetings of clubs or organizations: Never    Relationship status: Married  Other Topics Concern  . Not on file  Social History Narrative   Patient is married [name]).   Patient is a retired Arboriculturist.   Patient lives in  [place] living facility.   Patient has three adult children.   Patient does not drink any caffeine.   Patient is right-handed.    Outpatient Encounter Medications as of 09/24/2018  Medication Sig  . Amino Acids-Protein Hydrolys (FEEDING SUPPLEMENT, PRO-STAT SUGAR FREE 64,) LIQD Take 30 mLs by mouth daily.  Marland Kitchen buPROPion (WELLBUTRIN SR) 150 MG 12 hr tablet Take 150 mg by mouth 2 (two) times daily.   . carboxymethylcellul-glycerin (REFRESH REPAIR) 0.5-0.9 % ophthalmic solution Place 1 drop into both eyes 3 (three)  times daily.  . cholecalciferol (VITAMIN D) 1000 UNITS tablet Take 1,000 Units by mouth daily.  Marland Kitchen docusate sodium (COLACE) 100 MG capsule Take 100 mg by mouth daily as needed for mild constipation.   . ferrous sulfate 325 (65 FE) MG EC tablet Take 325 mg by mouth daily with breakfast.  . gabapentin (NEURONTIN) 300 MG capsule Take 1 tablet PO at 4 PM  and 1 tablet at 10 PM  . HYDROcodone-acetaminophen (NORCO/VICODIN) 5-325 MG tablet Take 1 tablet by mouth every 6 (six) hours as needed for moderate pain.  Marland Kitchen ipratropium (ATROVENT) 0.03 % nasal spray Place 2 sprays into both nostrils 2 (two) times daily.   Marland Kitchen lovastatin (MEVACOR) 20 MG tablet Take 10 mg by mouth at bedtime.   . Magnesium 250 MG TABS Take 250 mg by mouth daily.   . methocarbamol (ROBAXIN) 500 MG tablet Take 500 mg by mouth every 6 (six) hours as needed for muscle spasms.   . Multiple Minerals-Vitamins (CITRACAL PLUS BONE DENSITY) TABS Take 1 tablet by mouth 2 (two) times daily.   . Multiple Vitamins-Minerals (CENTRUM ADULTS PO) Take 1 tablet by mouth daily.  . Multiple Vitamins-Minerals (PRESERVISION AREDS 2 PO) Take 1 capsule by mouth 2 (two) times daily.  Marland Kitchen omeprazole (PRILOSEC) 20 MG capsule Take 40 mg by mouth 2 (two) times daily.   . Polyethylene Glycol 3350 (MIRALAX PO) Take 1 Scoop by mouth daily.   . propranolol (INDERAL) 10 MG tablet Take 10 mg by mouth daily.   . ranitidine (ZANTAC) 300 MG tablet TAKE 1  TABLET(300 MG) BY MOUTH AT BEDTIME  . rOPINIRole (REQUIP) 4 MG tablet Take 4 mg by mouth at bedtime.  . Tamsulosin HCl (FLOMAX) 0.4 MG CAPS Take 0.4 mg by mouth 2 (two) times daily.   . traMADol (ULTRAM) 50 MG tablet Take 100 mg by mouth 2 (two) times daily as needed.   No facility-administered encounter medications on file as of 09/24/2018.     Activities of Daily Living In your present state of health, do you have any difficulty performing the following activities: 09/24/2018 08/07/2018  Hearing? N N  Vision? N N  Difficulty concentrating or making decisions? Tempie Donning  Walking or climbing stairs? Y Y  Dressing or bathing? Y Y  Doing errands, shopping? Tempie Donning  Preparing Food and eating ? Y -  Using the Toilet? Y -  In the past six months, have you accidently leaked urine? N -  Do you have problems with loss of bowel control? N -  Managing your Medications? Y -  Managing your Finances? Y -  Housekeeping or managing your Housekeeping? Y -  Some recent data might be hidden    Patient Care Team: Ngetich, Nelda Bucks, NP as PCP - General (Family Medicine) [name], MD (Hematology and Oncology) [name], MD as Attending Physician (Urology) [name], MD (Gastroenterology)   Assessment:   This is a routine wellness examination for [facility].  Exercise Activities and Dietary recommendations Current Exercise Habits: Structured exercise class;Home exercise routine, Type of exercise: Other - see comments;strength training/weights(PT), Time (Minutes): 15, Frequency (Times/Week): 7, Weekly Exercise (Minutes/Week): 105, Intensity: Mild, Exercise limited by: orthopedic condition(s)  Goals   None     Fall Risk Fall Risk  09/24/2018 01/04/2017 10/06/2016 09/29/2016 09/22/2016  Falls in the past year? No No No No No   Is the patient's home free of loose throw rugs in walkways, pet beds, electrical cords, etc?   yes  Grab bars in the bathroom? yes      Handrails on the stairs?   yes       Adequate lighting?   yes  Depression Screen PHQ 2/9 Scores 09/24/2018 01/04/2017 10/06/2016 09/29/2016  PHQ - 2 Score 0 0 0 0    Cognitive Function MMSE - Mini Mental State Exam 09/24/2018  Orientation to time 5  Orientation to Place 5  Registration 3  Attention/ Calculation 4  Recall 2  Language- name 2 objects 2  Language- repeat 1  Language- follow 3 step command 3  Language- read & follow direction 1  Write a sentence 1  Copy design 0  Total score 27   Montreal Cognitive Assessment  06/20/2016  Visuospatial/ Executive (0/5) 5  Naming (0/3) 3  Attention: Read list of digits (0/2) 2  Attention: Read list of letters (0/1) 1  Attention: Serial 7 subtraction starting at 100 (0/3) 3  Language: Repeat phrase (0/2) 2  Language : Fluency (0/1) 1  Abstraction (0/2) 2  Delayed Recall (0/5) 2  Orientation (0/6) 5  Total 26  Adjusted Score (based on education) 26      Immunization History  Administered Date(s) Administered  . Influenza-Unspecified 09/16/2018  . Pneumococcal Conjugate-13 12/29/2013  . Pneumococcal Polysaccharide-23 09/08/2005  . Tdap 05/25/2005  . Zoster Recombinat (Shingrix) 10/17/2017, 01/25/2018    Qualifies for Shingles Vaccine?Up to date, completed  Screening Tests Health Maintenance  Topic Date Due  . TETANUS/TDAP  08/05/2019 (Originally 05/26/2015)  . FOOT EXAM  09/03/2019 (Originally 08/13/1941)  . OPHTHALMOLOGY EXAM  09/03/2019 (Originally 08/13/1941)  . URINE MICROALBUMIN  09/03/2019 (Originally 08/13/1941)  . INFLUENZA VACCINE  Completed  . PNA vac Low Risk Adult  Completed  . HEMOGLOBIN A1C  Discontinued   Cancer Screenings: Lung: Low Dose CT Chest recommended if Age 63-80 years, 30 pack-year currently smoking OR have quit w/in 15years. Patient does not qualify. Colorectal: up to date  Additional Screenings:  Hepatitis C Screening:declined      Plan:    I have personally reviewed and addressed the Medicare Annual Wellness questionnaire  and have noted the following in the patient's chart:  A. Medical and social history B. Use of alcohol, tobacco or illicit drugs  C. Current medications and supplements D. Functional ability and status E.  Nutritional status F.  Physical activity G. Advance directives H. List of other physicians I.  Hospitalizations, surgeries, and ER visits in previous 12 months J.  [place] to include hearing, vision, cognitive, depression L. Referrals and appointments - none  In addition, I have reviewed and discussed with patient certain preventive protocols, quality metrics, and best practice recommendations. A written personalized care plan for preventive services as well as general preventive health recommendations were provided to patient.  See attached scanned questionnaire for additional information.   Signed,   Tyson Dense, RN Nurse Health Advisor  Patient Concerns: None

## 2018-09-25 DIAGNOSIS — F329 Major depressive disorder, single episode, unspecified: Secondary | ICD-10-CM | POA: Diagnosis not present

## 2018-09-25 DIAGNOSIS — M545 Low back pain: Secondary | ICD-10-CM | POA: Diagnosis not present

## 2018-09-25 DIAGNOSIS — M6281 Muscle weakness (generalized): Secondary | ICD-10-CM | POA: Diagnosis not present

## 2018-09-25 DIAGNOSIS — R29898 Other symptoms and signs involving the musculoskeletal system: Secondary | ICD-10-CM | POA: Diagnosis not present

## 2018-09-25 DIAGNOSIS — D649 Anemia, unspecified: Secondary | ICD-10-CM | POA: Diagnosis not present

## 2018-09-25 DIAGNOSIS — G4733 Obstructive sleep apnea (adult) (pediatric): Secondary | ICD-10-CM | POA: Diagnosis not present

## 2018-09-26 DIAGNOSIS — M6281 Muscle weakness (generalized): Secondary | ICD-10-CM | POA: Diagnosis not present

## 2018-09-26 DIAGNOSIS — F329 Major depressive disorder, single episode, unspecified: Secondary | ICD-10-CM | POA: Diagnosis not present

## 2018-09-26 DIAGNOSIS — D649 Anemia, unspecified: Secondary | ICD-10-CM | POA: Diagnosis not present

## 2018-09-26 DIAGNOSIS — R29898 Other symptoms and signs involving the musculoskeletal system: Secondary | ICD-10-CM | POA: Diagnosis not present

## 2018-09-26 DIAGNOSIS — G4733 Obstructive sleep apnea (adult) (pediatric): Secondary | ICD-10-CM | POA: Diagnosis not present

## 2018-09-26 DIAGNOSIS — M545 Low back pain: Secondary | ICD-10-CM | POA: Diagnosis not present

## 2018-09-27 DIAGNOSIS — R29898 Other symptoms and signs involving the musculoskeletal system: Secondary | ICD-10-CM | POA: Diagnosis not present

## 2018-09-27 DIAGNOSIS — G4733 Obstructive sleep apnea (adult) (pediatric): Secondary | ICD-10-CM | POA: Diagnosis not present

## 2018-09-27 DIAGNOSIS — D649 Anemia, unspecified: Secondary | ICD-10-CM | POA: Diagnosis not present

## 2018-09-27 DIAGNOSIS — Q6689 Other  specified congenital deformities of feet: Secondary | ICD-10-CM | POA: Diagnosis not present

## 2018-09-27 DIAGNOSIS — M545 Low back pain: Secondary | ICD-10-CM | POA: Diagnosis not present

## 2018-09-27 DIAGNOSIS — M6281 Muscle weakness (generalized): Secondary | ICD-10-CM | POA: Diagnosis not present

## 2018-09-27 DIAGNOSIS — M79672 Pain in left foot: Secondary | ICD-10-CM | POA: Diagnosis not present

## 2018-09-27 DIAGNOSIS — F329 Major depressive disorder, single episode, unspecified: Secondary | ICD-10-CM | POA: Diagnosis not present

## 2018-09-27 DIAGNOSIS — B351 Tinea unguium: Secondary | ICD-10-CM | POA: Diagnosis not present

## 2018-09-27 DIAGNOSIS — M79671 Pain in right foot: Secondary | ICD-10-CM | POA: Diagnosis not present

## 2018-09-30 DIAGNOSIS — F329 Major depressive disorder, single episode, unspecified: Secondary | ICD-10-CM | POA: Diagnosis not present

## 2018-09-30 DIAGNOSIS — R29898 Other symptoms and signs involving the musculoskeletal system: Secondary | ICD-10-CM | POA: Diagnosis not present

## 2018-09-30 DIAGNOSIS — M6281 Muscle weakness (generalized): Secondary | ICD-10-CM | POA: Diagnosis not present

## 2018-09-30 DIAGNOSIS — G4733 Obstructive sleep apnea (adult) (pediatric): Secondary | ICD-10-CM | POA: Diagnosis not present

## 2018-09-30 DIAGNOSIS — D649 Anemia, unspecified: Secondary | ICD-10-CM | POA: Diagnosis not present

## 2018-09-30 DIAGNOSIS — M545 Low back pain: Secondary | ICD-10-CM | POA: Diagnosis not present

## 2018-10-01 ENCOUNTER — Encounter: Payer: Self-pay | Admitting: Family

## 2018-10-01 ENCOUNTER — Non-Acute Institutional Stay (SKILLED_NURSING_FACILITY): Payer: Medicare Other | Admitting: Family

## 2018-10-01 DIAGNOSIS — I10 Essential (primary) hypertension: Secondary | ICD-10-CM | POA: Diagnosis not present

## 2018-10-01 DIAGNOSIS — M5441 Lumbago with sciatica, right side: Secondary | ICD-10-CM | POA: Diagnosis not present

## 2018-10-01 DIAGNOSIS — Z978 Presence of other specified devices: Secondary | ICD-10-CM

## 2018-10-01 DIAGNOSIS — G250 Essential tremor: Secondary | ICD-10-CM | POA: Diagnosis not present

## 2018-10-01 DIAGNOSIS — Z9989 Dependence on other enabling machines and devices: Secondary | ICD-10-CM | POA: Diagnosis not present

## 2018-10-01 DIAGNOSIS — G629 Polyneuropathy, unspecified: Secondary | ICD-10-CM | POA: Diagnosis not present

## 2018-10-01 DIAGNOSIS — R2681 Unsteadiness on feet: Secondary | ICD-10-CM | POA: Diagnosis not present

## 2018-10-01 DIAGNOSIS — G4733 Obstructive sleep apnea (adult) (pediatric): Secondary | ICD-10-CM

## 2018-10-01 DIAGNOSIS — Z960 Presence of urogenital implants: Secondary | ICD-10-CM | POA: Diagnosis not present

## 2018-10-01 DIAGNOSIS — D638 Anemia in other chronic diseases classified elsewhere: Secondary | ICD-10-CM

## 2018-10-01 DIAGNOSIS — G8929 Other chronic pain: Secondary | ICD-10-CM

## 2018-10-01 DIAGNOSIS — M5442 Lumbago with sciatica, left side: Secondary | ICD-10-CM | POA: Diagnosis not present

## 2018-10-01 DIAGNOSIS — E785 Hyperlipidemia, unspecified: Secondary | ICD-10-CM

## 2018-10-01 NOTE — Progress Notes (Signed)
Location:  [facility] Room Number: N24A Place of Service:  SNF (31)  Provider: Marlowe Sax FNP-C   PCP: Sandrea Hughs, NP Patient Care Team: Abdulloh Ullom, Nelda Bucks, NP as PCP - General (Family Medicine) [name], MD (Hematology and Oncology) [name], MD as Attending Physician (Urology) [name], MD (Gastroenterology)  Extended Emergency Contact Information Primary Emergency Contact: Kutch,Janet H Address: [address]          [address]          [place], [place] [address] [name] of [place] Phone: [phone] Mobile Phone: [phone] Relation: Spouse  Code Status:Full Code  Goals of care:  Advanced Directive information Advanced Directives 10/01/2018  Does Patient Have a Medical Advance Directive? Yes  Type of Paramedic of [place];Living will  Does patient want to make changes to medical advance directive? No - Patient declined  Copy of [place] in Chart? Yes  Would patient like information on creating a medical advance directive? -     Allergies  Allergen Reactions  . Celebrex [Celecoxib] Other (See Comments)    Created stomach ulcers.  Marland Kitchen Flexeril [Cyclobenzaprine] Other (See Comments)    Caught in hiatal hernia and caused extreme pain    Chief Complaint  Patient presents with  . Discharge Note    Discharged from SNF on 10/03/2018 to [facility] independent Living     HPI:  84 y.o. male seen today at [facility] clinic for discharge back to [facility].He was here for short term rehabilitation for post ED visit for acute on chronic low back pain.He has a medical history of chronic low back pain status post lumbar fusion x 2 surgeries in 1998 and 2014,GERD,HTN,Hyperlipidemia,OSA on CPAP at bedtime,depression,prostate cancer status post radiation,radiation proctitis,Falls among other condition.He was seen by Orthopedic and neurosurgery.He is  status post sacral plasty at [facility] 07/15/2018 post fall in june.He states pain has improved post surgery.during his stay here in rehab he had hematuria and was treated for UTI and  followed up with urology.He has worked well with PT/OT now stable for discharge back to [facility].He will be discharged with PT/OT to continue with ROM, Exercise, Gait stability and muscle strengthening. He does not require any DME has own Front wheel walker. Discharge process will  be arranged by facility social worker prior to discharge.He will be discharged with his Prescription medication from the facility then patient to follow up with PCP Dr. Valetta Fuller  in 1-2 weeks.He will also follow up with [facility] for chronic indwelling foley catheter and Hx prostate cancer next appointment scheduled for 10/22/2018 at 10:30 am.He denies any acute issues this visit.Facility staff report no new concerns.His weight has been stable and no recent fall episode.    Past Medical History:  Diagnosis Date  . Anemia   . Arthritis   . AVM (arteriovenous malformation)   . Barrett's esophagus   . BPH (benign prostatic hyperplasia)   . CAD (coronary artery disease)    only on stress test; never had MI, PCI or CABG.  In 2008, LVEF was reportedly normal. pt. denies  . Cataract   . Constipation   . CPAP (continuous positive airway pressure) dependence   . Depression   . Diverticulosis    diverticular bleeding  . Diverticulosis   . GERD (gastroesophageal reflux disease)   . History of radiation therapy   . Hyperlipidemia   .  Hypoglycemia   . Lymphocytosis 06/2012  . Mastodynia   . Murmur    slight  . Nasal congestion   . Osteoporosis   . Osteoporosis   . Prostate cancer ([place])   . Sleep apnea    wears CPAP  . Urinary retention with incomplete bladder emptying    pt uses bathroom and within 30 mins needs to go again  . Vitamin D deficiency     Past Surgical History:  Procedure Laterality  Date  . AMPUTATION TOE Right 09/20/2017   Procedure: Right 4th toe amputation;  Surgeon: Wylene Simmer, MD;  Location: [facility];  Service: Orthopedics;  Laterality: Right;  foot block  . BACK SURGERY  04/2013  . back surgury     f  . BLEPHAROPLASTY Bilateral 02/23/15  . CATARACT EXTRACTION    . COLONOSCOPY    . COLONOSCOPY N/A 10/02/2017   Procedure: COLONOSCOPY;  Surgeon: Doran Stabler, MD;  Location: [facility];  Service: Gastroenterology;  Laterality: N/A;  . COLONOSCOPY WITH PROPOFOL N/A 10/01/2017   Procedure: COLONOSCOPY WITH PROPOFOL;  Surgeon: Doran Stabler, MD;  [facility];  Service: Gastroenterology;  Laterality: N/A;  . COLONOSCOPY WITH PROPOFOL N/A 03/07/2018   Procedure: COLONOSCOPY WITH PROPOFOL;  Surgeon: [name], MD;  [facility];  Service: Endoscopy;  Laterality: N/A;  . ESOPHAGOGASTRODUODENOSCOPY (EGD) WITH PROPOFOL N/A 10/01/2017   Procedure: ESOPHAGOGASTRODUODENOSCOPY (EGD) WITH PROPOFOL;  Surgeon: Doran Stabler, MD;  [facility];  Service: Gastroenterology;  Laterality: N/A;  . EYE SURGERY Bilateral   . FINGER SURGERY Right    right thumb  . HARDWARE REMOVAL Right 05/15/2016   Procedure: Right Lumbar Two-Lumbar Five Removal of Hardware;  Surgeon: Kristeen Miss, MD;  Location: [facility];  Service: Neurosurgery;  Laterality: Right;  Right L2-5 removal of hardware  . INGUINAL HERNIA REPAIR     x4  . IR RADIOLOGIST EVAL & MGMT  07/10/2018  . IR RADIOLOGIST EVAL & MGMT  09/05/2018  . IR SACROPLASTY BILATERAL  07/15/2018  . REPAIR SPIGELIAN HERNIA  2011  . [place] ,2014  . TONSILLECTOMY    . UPPER GASTROINTESTINAL ENDOSCOPY    . VASECTOMY        reports that he has never smoked. He has never used smokeless tobacco. He reports that he does not drink alcohol or use drugs. Social History   Socioeconomic History  . Marital status: Married    Spouse name: [name]  . Number of children: 3    . Years of education: College  . Highest education level: Not on file  Occupational History  . 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 Financial resource strain: Not hard at all  . Food insecurity:    Worry: Never true    Inability: Never true  . Transportation needs:    Medical: No    Non-medical: No  Tobacco Use  . Smoking status: Never Smoker  . Smokeless tobacco: Never Used  Substance and Sexual Activity  . Alcohol use: No  . Drug use: No  . Sexual activity: Never  Lifestyle  . Physical activity:    Days per week: 7 days    Minutes per session: 10 min  . Stress: Not at all  Relationships  . Social connections:    Talks on phone: More than three times a week    Gets together: More than  three times a week    Attends religious service: Never    Active member of club or organization: No    Attends meetings of clubs or organizations: Never    Relationship status: Married  . Intimate partner violence:    Fear of current or ex partner: No    Emotionally abused: No    Physically abused: No    Forced sexual activity: No  Other Topics Concern  . Not on file  Social History Narrative   Patient is married [name]).   Patient is a retired Arboriculturist.   Patient lives in [facility] living facility.   Patient has three adult children.   Patient does not drink any caffeine.   Patient is right-handed.    Allergies  Allergen Reactions  . Celebrex [Celecoxib] Other (See Comments)    Created stomach ulcers.  Marland Kitchen Flexeril [Cyclobenzaprine] Other (See Comments)    Caught in hiatal hernia and caused extreme pain    Pertinent  Health Maintenance Due  Topic Date Due  . FOOT EXAM  09/03/2019 (Originally 08/13/1941)  . OPHTHALMOLOGY EXAM  09/03/2019 (Originally 08/13/1941)  . URINE MICROALBUMIN  09/03/2019 (Originally 08/13/1941)  . INFLUENZA VACCINE  Completed  . PNA vac Low Risk Adult  Completed  . HEMOGLOBIN A1C  Discontinued     Medications: Outpatient Encounter Medications as of 10/01/2018  Medication Sig  . buPROPion (WELLBUTRIN SR) 150 MG 12 hr tablet Take 150 mg by mouth 2 (two) times daily.   . carboxymethylcellul-glycerin (REFRESH REPAIR) 0.5-0.9 % ophthalmic solution Place 1 drop into both eyes 3 (three) times daily.  . cholecalciferol (VITAMIN D) 1000 UNITS tablet Take 1,000 Units by mouth daily.  Marland Kitchen docusate sodium (COLACE) 100 MG capsule Take 100 mg by mouth daily.   . ferrous sulfate 325 (65 FE) MG EC tablet Take 325 mg by mouth daily with breakfast.  . gabapentin (NEURONTIN) 300 MG capsule Take 1 tablet PO at 4 PM  and 1 tablet at 10 PM  . HYDROcodone-acetaminophen (NORCO/VICODIN) 5-325 MG tablet Take 1 tablet by mouth every 6 (six) hours as needed for moderate pain.  Marland Kitchen ipratropium (ATROVENT) 0.03 % nasal spray Place 2 sprays into both nostrils 2 (two) times daily.   Marland Kitchen lovastatin (MEVACOR) 20 MG tablet Take 10 mg by mouth at bedtime.   . Magnesium 250 MG TABS Take 250 mg by mouth daily.   . methocarbamol (ROBAXIN) 500 MG tablet Take 500 mg by mouth every 6 (six) hours as needed for muscle spasms.   . Multiple Minerals-Vitamins (CITRACAL PLUS BONE DENSITY) TABS Take 1 tablet by mouth 2 (two) times daily.   . Multiple Vitamins-Minerals (CENTRUM ADULTS PO) Take 1 tablet by mouth daily.  . Multiple Vitamins-Minerals (PRESERVISION AREDS 2 PO) Take 1 capsule by mouth 2 (two) times daily.  Marland Kitchen omeprazole (PRILOSEC) 40 MG capsule Take 40 mg by mouth 2 (two) times daily.  . Polyethylene Glycol 3350 (MIRALAX PO) Take 1 Scoop by mouth daily.   . propranolol (INDERAL) 10 MG tablet Take 10 mg by mouth daily.   Marland Kitchen rOPINIRole (REQUIP) 4 MG tablet Take 4 mg by mouth at bedtime.  . Tamsulosin HCl (FLOMAX) 0.4 MG CAPS Take 0.4 mg by mouth 2 (two) times daily.   . traMADol (ULTRAM) 50 MG tablet Take 100 mg by mouth 2 (two) times daily. Hold for sedation  . [DISCONTINUED] Amino Acids-Protein Hydrolys (FEEDING SUPPLEMENT,  PRO-STAT SUGAR FREE 64,) LIQD Take 30 mLs by mouth daily.  . [DISCONTINUED] omeprazole (PRILOSEC)  20 MG capsule Take 40 mg by mouth 2 (two) times daily.   . [DISCONTINUED] ranitidine (ZANTAC) 300 MG tablet TAKE 1 TABLET(300 MG) BY MOUTH AT BEDTIME   No facility-administered encounter medications on file as of 10/01/2018.      Review of Systems  Constitutional: Negative for appetite change, chills, fatigue, fever and unexpected weight change.  HENT: Negative for congestion, rhinorrhea, sinus pressure, sinus pain, sneezing and sore throat.        Hx barrett's esophagus   Eyes: Positive for visual disturbance. Negative for discharge, redness and itching.  Respiratory: Negative for cough, chest tightness, shortness of breath and wheezing.   Cardiovascular: Positive for leg swelling. Negative for chest pain and palpitations.  Gastrointestinal: Negative for abdominal distention, abdominal pain, constipation, diarrhea, nausea and vomiting.  Endocrine: Negative for cold intolerance, heat intolerance, polydipsia, polyphagia and polyuria.  Genitourinary: Negative for dysuria and urgency.       Indwelling foley catheter   Musculoskeletal: Positive for back pain and gait problem.  Skin: Negative for color change, pallor, rash and wound.  Neurological: Negative for dizziness, light-headedness and headaches.       Resting chronic tremors on inderal   Hematological: Does not bruise/bleed easily.  Psychiatric/Behavioral: Negative for agitation, confusion and sleep disturbance. The patient is not nervous/anxious.     Vitals:   10/01/18 1146  BP: 136/66  Pulse: 62  Resp: 16  Temp: 98 F (36.7 C)  TempSrc: Oral  SpO2: 98%  Weight: 160 lb (72.6 kg)  Height: 5\' 6"  (1.676 m)   Body mass index is 25.82 kg/m. Physical Exam  Nursing note and vitals reviewed. Constitutional: He appears well-developed and well-nourished.  Elderly in no acute distress   HENT:  Head: Normocephalic.  Right Ear:  External ear normal.  Left Ear: External ear normal.  Mouth/Throat: Oropharynx is clear and moist. No oropharyngeal exudate.  Eyes: Pupils are equal, round, and reactive to light. Conjunctivae and EOM are normal. Right eye exhibits no discharge. Left eye exhibits no discharge. No scleral icterus.  Eye glasses in place   Neck: Normal range of motion. No JVD present. No thyromegaly present.  Cardiovascular: Normal rate, regular rhythm, normal heart sounds and intact distal pulses. Exam reveals no gallop and no friction rub.  No murmur heard. Respiratory: Effort normal and breath sounds normal. No respiratory distress. He has no wheezes. He has no rales.  GI: Soft. Bowel sounds are normal. He exhibits no distension and no mass. There is no tenderness. There is no rebound and no guarding.  Genitourinary:  Genitourinary Comments: Indwelling foley catheter draining clear yellow urine to left leg bag.   Musculoskeletal: He exhibits no tenderness.  Moves x 4 extremities unsteady gait ambulates with front wheel walker. Bilateral lower extremities trace chronic  edema left leg > right.   Lymphadenopathy:    He has no cervical adenopathy.  Neurological: Gait abnormal.  Alert and oriented to person and place   Skin: Skin is warm and dry. No rash noted. No erythema. No pallor.  Skin intact  Psychiatric: He has a normal mood and affect. His speech is normal and behavior is normal. Judgment and thought content normal.   Labs reviewed: Basic Metabolic Panel: Recent Labs    10/03/17 2130  05/30/18  07/15/18 0704 07/18/18 08/06/18  NA 140   < > 138   < > 133* 135 137  K 3.8   < > 4.3   < > 4.2 4.4 4.0  CL 109  --  104  --  100  --   --   CO2 23  --  26  --  22  --   --   GLUCOSE 93  --   --   --  135*  --   --   BUN 13   < > 22*   < > 23 18 13   CREATININE 0.84   < > 0.8   < > 0.76 0.52 0.69  CALCIUM 9.2  --  8.6   < > 8.9 8.2 8.2   < > = values in this interval not displayed.   Liver Function  Tests: Recent Labs    05/30/18 07/08/18 08/06/18  AST 32 102 27  ALT 16 67 11  ALKPHOS 112 122 90  BILITOT  --  0.5 0.3  PROT 6.2 6.3 5.3  ALBUMIN 3.3 3.3 2.8   CBC: Recent Labs    10/17/17 1100  02/28/18 1126  05/17/18 1524 05/30/18 07/08/18 07/15/18 0704 08/03/18 08/06/18 08/20/18  WBC 5.1   < > 6.1   < > 7.3 6.9 7.8 8.4 40-60 7.3 6.7  NEUTROABS 3.7  --  4.5  --  5.6  --   --   --   --   --   --   HGB 11.2*   < > 10.6*   < > 12.0* 11.9* 12.0 12.6*  --  10.3 10.4*  HCT 33.6*   < > 31.6*   < > 35.8* 36* 35.5 39.8  --  30.5 32*  MCV 91.0  --  89.5  --  87.7  --  85.7 90.2  --  85  --   PLT 223.0   < > 178.0   < > 197.0 312  --  311  --   --  259   < > = values in this interval not displayed.    Assessment/Plan:      1. Unsteady gait Has worked well with PT/ OT. Will discharge with PT/OT to continue with ROM, Exercise, Gait stability and muscle strengthening. No DME required has own FWW.continue on Fall and safety precautions.  2. Essential hypertension B/p reviewed stable.continue on inderal 10 mg tablet daily.BMP in 1-2 weeks with PCP.    3. Hyperlipidemia, unspecified hyperlipidemia type Recent LDL 98 (08/29/2018).Continue on lovastatin 10 mg tablet daily.check Lipid panel periodically.   4. Anemia of chronic disease Recent Hgb 10.4 (08/20/2018) previous 10.3.continue on ferrous sulfate 325 mg tablet daily.CBC in 1-2 weeks with PCP.  5. Neuropathy Continue on gabapentin 300 mg capsule twice daily at 4 pm and 10 Pm.   6. OSA on CPAP Continue on CPAP at bedtime.   7. Chronic bilateral low back pain with bilateral sciatica Status post lumbar surgeries x 2  status post lumbar fusion x 2 surgeries in 1998, 2014 and recent sacral plasty 07/15/2018.Pain has improved.continue current pain regimen.continue to follow up with Orthopedic and Neuro.  8. Tremor, essential Continue on propranolol 10 mg tablet daily.  9. Chronic indwelling Foley catheter Hx prostate cancer  Treated  with radiation.Will discharge with OT for foley care.Follow up with [facility] for chronic indwelling foley catheter and Hx prostate cancer next appointment scheduled for 10/22/2018 at 10:30 am.   Patient is being discharged with the following home health services:   -PT/OT for ROM, exercise, gait stability and muscle strengthening  Patient is being discharged with the following durable medical equipment:    - None required has own [place]   Patient has been  advised to f/u with their PCP in 1-2 weeks to for a transitions of care visit.Social services at their facility was responsible for arranging this appointment. Pt was provided with adequate prescriptions of noncontrolled medications to reach the scheduled appointment.For controlled substances, a limited supply was provided as appropriate for the individual patient. If the pt normally receives these medications from a pain clinic or has a contract with another physician, these medications should be received from that clinic or physician only).    Future labs/tests needed:  CBC, BMP in 1-2 weeks PCP

## 2018-10-02 DIAGNOSIS — M545 Low back pain: Secondary | ICD-10-CM | POA: Diagnosis not present

## 2018-10-02 DIAGNOSIS — M6281 Muscle weakness (generalized): Secondary | ICD-10-CM | POA: Diagnosis not present

## 2018-10-02 DIAGNOSIS — G4733 Obstructive sleep apnea (adult) (pediatric): Secondary | ICD-10-CM | POA: Diagnosis not present

## 2018-10-02 DIAGNOSIS — D649 Anemia, unspecified: Secondary | ICD-10-CM | POA: Diagnosis not present

## 2018-10-02 DIAGNOSIS — R29898 Other symptoms and signs involving the musculoskeletal system: Secondary | ICD-10-CM | POA: Diagnosis not present

## 2018-10-02 DIAGNOSIS — F329 Major depressive disorder, single episode, unspecified: Secondary | ICD-10-CM | POA: Diagnosis not present

## 2018-10-04 DIAGNOSIS — M6281 Muscle weakness (generalized): Secondary | ICD-10-CM | POA: Diagnosis not present

## 2018-10-08 DIAGNOSIS — M6281 Muscle weakness (generalized): Secondary | ICD-10-CM | POA: Diagnosis not present

## 2018-10-09 DIAGNOSIS — M6281 Muscle weakness (generalized): Secondary | ICD-10-CM | POA: Diagnosis not present

## 2018-10-16 DIAGNOSIS — R2681 Unsteadiness on feet: Secondary | ICD-10-CM | POA: Diagnosis not present

## 2018-10-16 DIAGNOSIS — M545 Low back pain: Secondary | ICD-10-CM | POA: Diagnosis not present

## 2018-10-16 DIAGNOSIS — M810 Age-related osteoporosis without current pathological fracture: Secondary | ICD-10-CM | POA: Diagnosis not present

## 2018-10-16 DIAGNOSIS — G4733 Obstructive sleep apnea (adult) (pediatric): Secondary | ICD-10-CM | POA: Diagnosis not present

## 2018-10-16 DIAGNOSIS — F3289 Other specified depressive episodes: Secondary | ICD-10-CM | POA: Diagnosis not present

## 2018-10-16 DIAGNOSIS — I2510 Atherosclerotic heart disease of native coronary artery without angina pectoris: Secondary | ICD-10-CM | POA: Diagnosis not present

## 2018-10-16 DIAGNOSIS — C61 Malignant neoplasm of prostate: Secondary | ICD-10-CM | POA: Diagnosis not present

## 2018-10-16 DIAGNOSIS — N319 Neuromuscular dysfunction of bladder, unspecified: Secondary | ICD-10-CM | POA: Diagnosis not present

## 2018-10-16 DIAGNOSIS — Z6826 Body mass index (BMI) 26.0-26.9, adult: Secondary | ICD-10-CM | POA: Diagnosis not present

## 2018-10-18 DIAGNOSIS — M6281 Muscle weakness (generalized): Secondary | ICD-10-CM | POA: Diagnosis not present

## 2018-10-22 DIAGNOSIS — R3914 Feeling of incomplete bladder emptying: Secondary | ICD-10-CM | POA: Diagnosis not present

## 2018-10-23 DIAGNOSIS — M6281 Muscle weakness (generalized): Secondary | ICD-10-CM | POA: Diagnosis not present

## 2018-11-14 DIAGNOSIS — H35373 Puckering of macula, bilateral: Secondary | ICD-10-CM | POA: Diagnosis not present

## 2018-12-05 ENCOUNTER — Encounter (INDEPENDENT_AMBULATORY_CARE_PROVIDER_SITE_OTHER): Payer: Self-pay | Admitting: Physician Assistant

## 2018-12-05 ENCOUNTER — Ambulatory Visit (INDEPENDENT_AMBULATORY_CARE_PROVIDER_SITE_OTHER): Payer: Medicare Other | Admitting: Physician Assistant

## 2018-12-05 ENCOUNTER — Ambulatory Visit (INDEPENDENT_AMBULATORY_CARE_PROVIDER_SITE_OTHER): Payer: Medicare Other

## 2018-12-05 DIAGNOSIS — L84 Corns and callosities: Secondary | ICD-10-CM | POA: Diagnosis not present

## 2018-12-05 DIAGNOSIS — M869 Osteomyelitis, unspecified: Secondary | ICD-10-CM | POA: Insufficient documentation

## 2018-12-05 NOTE — Progress Notes (Signed)
Office Visit Note   Patient: Charles [name]           Date of Birth: 05/29/1931           MRN: [id number] Visit Date: 12/05/2018              Requested by: Sandrea Hughs, NP [address] [place], [place] [address] PCP: Sandrea Hughs, NP   Assessment & Plan: Visit Diagnoses:  1. Callus     Plan: Impression is right great toe callus with underlying neuropathy.  Today, the callus was debrided by Dr. Sharol Given.  We did apply mupirocin ointment and covered with a Band-Aid.  He will continue to do this twice daily after washing with antibacterial soap.  He will follow-up with [place] in 2 weeks time for recheck.  Follow-Up Instructions: Return in about 2 weeks (around 12/19/2018).   Orders:  Orders Placed This Encounter  Procedures  . XR Toe Great Right   No orders of the defined types were placed in this encounter.     Procedures: No procedures performed   Clinical Data: No additional findings.   Subjective: Chief Complaint  Patient presents with  . Right Great Toe - Pain, Wound Check    HPI patient is a pleasant 85 year old gentleman who presents to our clinic today with a draining callus to the right great toe, medial aspect.  This began approximately 10 days ago.  His wife has been applying mupirocin ointment once daily to the affected area.  He has noticed decreased drainage.  Minimal pain which only occurs when ambulating.  He does note mild neuropathy.  He is not a diabetic.  No fevers or chills.  Review of Systems as detailed in HPI.  All others reviewed and are negative.   Objective: Vital Signs: There were no vitals taken for this visit.  Physical Exam well-developed and well-nourished gentleman in no acute distress.  Alert and oriented x3.  Ortho Exam examination of the right great toe reveals a callus approximately 2.5 x 1.5 cm.  No active drainage.  No signs of infection.  Decreased sensation.  1+ dorsalis pedis pulse.  Specialty Comments:  No specialty  comments available.  Imaging: Xr Toe Great Right  Result Date: 12/05/2018 No evidence of osteomyelitis    PMFS History: Patient Active Problem List   Diagnosis Date Noted  . Great toe pain, right 12/05/2018  . Sacral insufficiency fracture 07/04/2018  . Unsteady gait 07/04/2018  . Mild cognitive impairment 07/04/2018  . Neuropathy 07/04/2018  . CAD (coronary artery disease) 05/29/2018  . AVM (arteriovenous malformation) of colon   . Radiation proctitis   . Toe amputation status, right 03/01/2018  . Diverticulosis of colon with hemorrhage   . Acute blood loss anemia   . History of back surgery 10/03/2017  . Rectal bleeding 10/03/2017  . Hematochezia   . Lower GI bleeding 09/29/2017  . Treatment-emergent central sleep apnea 06/25/2017  . Chronic bilateral low back pain with bilateral sciatica 01/17/2017  . Post laminectomy syndrome 01/17/2017  . Chronic pain syndrome 01/17/2017  . Sacroiliitis ([place]) 01/17/2017  . Piriformis syndrome of right side 01/17/2017  . Malignant neoplasm of prostate ([place]) 07/19/2016  . Fixation hardware in spine 05/15/2016  . Hypoglycemic reaction 02/02/2015  . Tremor, essential 12/11/2014  . OSA on CPAP 04/24/2014  . 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 Hypersomnia with sleep apnea, unspecified 04/24/2014  . Depression 04/18/2013  . BPH (benign prostatic hyperplasia) 04/18/2013  .  Impingement syndrome of right shoulder 04/14/2013    Class: Acute  . Scoliosis or kyphoscoliosis 04/08/2013    Class: Chronic  . Lumbosacral spondylosis without myelopathy 04/08/2013    Class: Chronic  . Acute respiratory failure ([place]) 04/08/2013  . Essential hypertension 04/08/2013  . Lymphocytosis   . Diaphragmatic hernia 11/21/2010  . DIVERTICULAR DISEASE 11/21/2010  . BARRETT'S ESOPHAGUS 02/22/2010  . GERD 07/20/2008  . INGUINAL HERNIA, LEFT, SMALL 07/20/2008  . OTH VENTRAL HERN W/O MENTION OBSTRUCTION/GANGREN 07/20/2008  . CONSTIPATION 07/20/2008  .  DYSPHAGIA 07/20/2008   Past Medical History:  Diagnosis Date  . Anemia   . Arthritis   . AVM (arteriovenous malformation)   . Barrett's esophagus   . BPH (benign prostatic hyperplasia)   . CAD (coronary artery disease)    only on stress test; never had MI, PCI or CABG.  In 2008, LVEF was reportedly normal. pt. denies  . Cataract   . Constipation   . CPAP (continuous positive airway pressure) dependence   . Depression   . Diverticulosis    diverticular bleeding  . Diverticulosis   . GERD (gastroesophageal reflux disease)   . History of radiation therapy   . Hyperlipidemia   . Hypoglycemia   . Lymphocytosis 06/2012  . Mastodynia   . Murmur    slight  . Nasal congestion   . Osteoporosis   . Osteoporosis   . Prostate cancer ([place])   . Sleep apnea    wears CPAP  . Urinary retention with incomplete bladder emptying    pt uses bathroom and within 30 mins needs to go again  . Vitamin D deficiency     Family History  Problem Relation Age of Onset  . Prostate cancer Father   . CAD Mother   . Arthritis-Osteo Brother   . Colon cancer Neg Hx   . Stomach cancer Neg Hx     Past Surgical History:  Procedure Laterality Date  . AMPUTATION TOE Right 09/20/2017   Procedure: Right 4th toe amputation;  Surgeon: Wylene Simmer, MD;  Location: [facility];  Service: Orthopedics;  Laterality: Right;  foot block  . BACK SURGERY  04/2013  . back surgury     f  . BLEPHAROPLASTY Bilateral 02/23/15  . CATARACT EXTRACTION    . COLONOSCOPY    . COLONOSCOPY N/A 10/02/2017   Procedure: COLONOSCOPY;  Surgeon: Doran Stabler, MD;  Location[facility];  Service: Gastroenterology;  Laterality: N/A;  . COLONOSCOPY WITH PROPOFOL N/A 10/01/2017   Procedure: COLONOSCOPY WITH PROPOFOL;  Surgeon: Doran Stabler, MD;  [facility];  Service: Gastroenterology;  Laterality: N/A;  . COLONOSCOPY WITH PROPOFOL N/A 03/07/2018   Procedure: COLONOSCOPY WITH PROPOFOL;  Surgeon:  [name], MD;  [facility];  Service: Endoscopy;  Laterality: N/A;  . ESOPHAGOGASTRODUODENOSCOPY (EGD) WITH PROPOFOL N/A 10/01/2017   Procedure: ESOPHAGOGASTRODUODENOSCOPY (EGD) WITH PROPOFOL;  Surgeon: Doran Stabler, MD;  [facility];  Service: Gastroenterology;  Laterality: N/A;  . EYE SURGERY Bilateral   . FINGER SURGERY Right    right thumb  . HARDWARE REMOVAL Right 05/15/2016   Procedure: Right Lumbar Two-Lumbar Five Removal of Hardware;  Surgeon: Kristeen Miss, MD;  Location: [facility];  Service: Neurosurgery;  Laterality: Right;  Right L2-5 removal of hardware  . INGUINAL HERNIA REPAIR     x4  . IR RADIOLOGIST EVAL & MGMT  07/10/2018  . IR RADIOLOGIST EVAL & MGMT  09/05/2018  . IR  SACROPLASTY BILATERAL  07/15/2018  . REPAIR SPIGELIAN HERNIA  2011  . [place] ,2014  . TONSILLECTOMY    . UPPER GASTROINTESTINAL ENDOSCOPY    . VASECTOMY     Social History   Occupational History  . 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 Smoking status: Never Smoker  . Smokeless tobacco: Never Used  Substance and Sexual Activity  . Alcohol use: No  . Drug use: No  . Sexual activity: Never

## 2018-12-10 DIAGNOSIS — H6123 Impacted cerumen, bilateral: Secondary | ICD-10-CM | POA: Diagnosis not present

## 2018-12-10 DIAGNOSIS — Z6827 Body mass index (BMI) 27.0-27.9, adult: Secondary | ICD-10-CM | POA: Diagnosis not present

## 2018-12-12 ENCOUNTER — Emergency Department (HOSPITAL_COMMUNITY)
Admission: EM | Admit: 2018-12-12 | Discharge: 2018-12-13 | Disposition: A | Discharge status: Home / Self Care | Payer: Medicare Other | Attending: Emergency Medicine | Admitting: Emergency Medicine

## 2018-12-12 ENCOUNTER — Encounter: Payer: Self-pay | Admitting: Gastroenterology

## 2018-12-12 ENCOUNTER — Ambulatory Visit (INDEPENDENT_AMBULATORY_CARE_PROVIDER_SITE_OTHER): Payer: Medicare Other | Admitting: Gastroenterology

## 2018-12-12 VITALS — BP 118/60 | HR 66 | Ht 66.0 in | Wt 168.0 lb

## 2018-12-12 DIAGNOSIS — Z79899 Other long term (current) drug therapy: Secondary | ICD-10-CM | POA: Diagnosis not present

## 2018-12-12 DIAGNOSIS — K2270 Barrett's esophagus without dysplasia: Secondary | ICD-10-CM

## 2018-12-12 DIAGNOSIS — R310 Gross hematuria: Secondary | ICD-10-CM | POA: Diagnosis not present

## 2018-12-12 DIAGNOSIS — Z8546 Personal history of malignant neoplasm of prostate: Secondary | ICD-10-CM | POA: Diagnosis not present

## 2018-12-12 DIAGNOSIS — Y738 Miscellaneous gastroenterology and urology devices associated with adverse incidents, not elsewhere classified: Secondary | ICD-10-CM | POA: Diagnosis not present

## 2018-12-12 DIAGNOSIS — K627 Radiation proctitis: Secondary | ICD-10-CM | POA: Diagnosis not present

## 2018-12-12 DIAGNOSIS — I2510 Atherosclerotic heart disease of native coronary artery without angina pectoris: Secondary | ICD-10-CM | POA: Diagnosis not present

## 2018-12-12 DIAGNOSIS — T83091A Other mechanical complication of indwelling urethral catheter, initial encounter: Secondary | ICD-10-CM | POA: Diagnosis not present

## 2018-12-12 MED ORDER — OMEPRAZOLE 40 MG PO CPDR: 40.0000 mg | DELAYED_RELEASE_CAPSULE | Freq: Two times a day (BID) | ORAL | 11 refills | Status: DC

## 2018-12-12 NOTE — Progress Notes (Signed)
    History of Present Illness: This is a 85 year old male with radiation proctitis, Barrett's, GERD. He is accompanied by his wife. Both are using walkers. He sustained a sacrum fracture in 06/2018 with a prolonged recovery.  He has had difficulty ambulating since then and now uses a walker.  He has no reflux symptoms.  He does have a frequent dry cough that could be related to LPR.  He denies any rectal bleeding in several months.  He is managing constipation very well with a combination of stool softeners, prune juice and MiraLAX.  Current Medications, Allergies, Past Medical History, Past Surgical History, Family History and Social History were reviewed in [employer] record.  Physical Exam: General: Well developed, well nourished, no acute distress, elderly, uses a walker Head: Normocephalic and atraumatic Eyes:  sclerae anicteric, EOMI Ears: Normal auditory acuity Mouth: No deformity or lesions Lungs: Clear throughout to auscultation Heart: Regular rate and rhythm; no murmurs, rubs or bruits Abdomen: Soft, non tender and non distended. No masses, hepatosplenomegaly or hernias noted. Normal Bowel sounds Rectal: Not done Musculoskeletal: Symmetrical with no gross deformities  Pulses:  Normal pulses noted Extremities: No clubbing, cyanosis, edema or deformities noted Neurological: Alert oriented x 4, grossly nonfocal Psychological:  Alert and cooperative. Normal mood and affect   Assessment and Recommendations:  1.  Barrett's esophagus, short segment without dysplasia and GERD with possible LPR.  Follow standard antireflux measures.  Continue omeprazole 40 mg twice daily.  EGD was performed in 2018.  Given his age, comorbidities and very good symptom control no plans for future surveillance EGDs. REV in 1 year.  2.  Radiation proctitis history of colonic AVM, both treated with APC in April 2019.  No recent rectal bleeding.  3.  Constipation and diverticulosis.   Continue current regimen with stool softener, prune juice and MiraLAX.

## 2018-12-12 NOTE — Patient Instructions (Signed)
We have sent the following medications to your pharmacy for you to pick up at your convenience:omeprazole.  Thank you for choosing me and  Gastroenterology.  Malcolm T. Stark, Jr., MD., FACG   

## 2018-12-13 ENCOUNTER — Encounter (HOSPITAL_COMMUNITY): Payer: Self-pay | Admitting: Emergency Medicine

## 2018-12-13 DIAGNOSIS — T83091A Other mechanical complication of indwelling urethral catheter, initial encounter: Secondary | ICD-10-CM | POA: Diagnosis not present

## 2018-12-13 LAB — URINALYSIS, ROUTINE W REFLEX MICROSCOPIC
Bilirubin Urine: NEGATIVE
GLUCOSE, UA: NEGATIVE mg/dL
Ketones, ur: NEGATIVE mg/dL
Nitrite: NEGATIVE
PH: 6 (ref 5.0–8.0)
PROTEIN: 100 mg/dL — AB
RBC / HPF: >50 RBC/hpf — ABNORMAL HIGH (ref 0–5)
SPECIFIC GRAVITY, URINE: 1.011 (ref 1.005–1.030)
WBC, UA: >50 WBC/hpf — ABNORMAL HIGH (ref 0–5)

## 2018-12-13 MED ORDER — CEPHALEXIN 500 MG PO CAPS: 500.0000 mg | ORAL_CAPSULE | Freq: Four times a day (QID) | ORAL | 0 refills | Status: DC

## 2018-12-13 MED ORDER — CEPHALEXIN 500 MG PO CAPS
500.0000 mg | ORAL_CAPSULE | Freq: Once | ORAL | Status: AC
  Administered 2018-12-13: 500 mg via ORAL
  Filled 2018-12-13: qty 1

## 2018-12-13 NOTE — ED Notes (Signed)
Bladder scanned pt, had 350 mL in bladder, irrigated catheter with 20 cc, urine returned, about 400 mL urine, catheter flowing patently.

## 2018-12-13 NOTE — ED Provider Notes (Signed)
[facility] DEPT Provider Note: Charles Spurling, MD, FACEP  CSN: [id number] MRN: [id number] ARRIVAL: 12/12/18 at [place]: Charles [name]   HISTORY OF PRESENT ILLNESS  12/13/18 12:35 AM Charles [name] is a 85 y.o. male who has had an indwelling [name] about 5 months due to bladder dysfunction.  He has the catheter changed once monthly, next due January 20.  He is here with acute obstruction of his [name] catheter since yesterday afternoon.  He is having severe pain in his bladder.  He attempted to drink water to unclog his catheter which was unsuccessful and caused his bladder to feel more.  He denies associated fever or chills.  He has noticed blood in his urine.   Past Medical History:  Diagnosis Date  . Anemia   . Arthritis   . AVM (arteriovenous malformation)   . Barrett's esophagus   . BPH (benign prostatic hyperplasia)   . CAD (coronary artery disease)    only on stress test; never had MI, PCI or CABG.  In 2008, LVEF was reportedly normal. pt. denies  . Cataract   . Constipation   . CPAP (continuous positive airway pressure) dependence   . Depression   . Diverticulosis    diverticular bleeding  . Diverticulosis   . GERD (gastroesophageal reflux disease)   . History of radiation therapy   . Hyperlipidemia   . Hypoglycemia   . Lymphocytosis 06/2012  . Mastodynia   . Murmur    slight  . Nasal congestion   . Osteoporosis   . Osteoporosis   . Prostate cancer ([place])   . Sleep apnea    wears CPAP  . Urinary retention with incomplete bladder emptying    pt uses bathroom and within 30 mins needs to go again  . Vitamin D deficiency     Past Surgical History:  Procedure Laterality Date  . AMPUTATION TOE Right 09/20/2017   Procedure: Right 4th toe amputation;  Surgeon: Wylene Simmer, MD;  Location: [facility];  Service: Orthopedics;  Laterality: Right;  foot block  . BACK SURGERY  04/2013  . back surgury     f  .  BLEPHAROPLASTY Bilateral 02/23/15  . CATARACT EXTRACTION    . COLONOSCOPY    . COLONOSCOPY N/A 10/02/2017   Procedure: COLONOSCOPY;  Surgeon: Doran Stabler, MD;  Location: [facility];  Service: Gastroenterology;  Laterality: N/A;  . COLONOSCOPY WITH PROPOFOL N/A 10/01/2017   Procedure: COLONOSCOPY WITH PROPOFOL;  Surgeon: Doran Stabler, MD;  Location: [facility];  Service: Gastroenterology;  Laterality: N/A;  . COLONOSCOPY WITH PROPOFOL N/A 03/07/2018   Procedure: COLONOSCOPY WITH PROPOFOL;  Surgeon: [name], MD;  Location: [facility];  Service: Endoscopy;  Laterality: N/A;  . ESOPHAGOGASTRODUODENOSCOPY (EGD) WITH PROPOFOL N/A 10/01/2017   Procedure: ESOPHAGOGASTRODUODENOSCOPY (EGD) WITH PROPOFOL;  Surgeon: Doran Stabler, MD;  Location: [facility];  Service: Gastroenterology;  Laterality: N/A;  . EYE SURGERY Bilateral   . FINGER SURGERY Right    right thumb  . HARDWARE REMOVAL Right 05/15/2016   Procedure: Right Lumbar Two-Lumbar Five Removal of Hardware;  Surgeon: Kristeen Miss, MD;  Location: [facility];  Service: Neurosurgery;  Laterality: Right;  Right L2-5 removal of hardware  . INGUINAL HERNIA REPAIR     x4  . IR RADIOLOGIST EVAL & MGMT  07/10/2018  . IR RADIOLOGIST EVAL & MGMT  09/05/2018  . IR SACROPLASTY BILATERAL  07/15/2018  .  REPAIR SPIGELIAN HERNIA  2011  . [place] ,2014  . TONSILLECTOMY    . UPPER GASTROINTESTINAL ENDOSCOPY    . VASECTOMY      Family History  Problem Relation Age of Onset  . Prostate cancer Father   . CAD Mother   . Arthritis-Osteo Brother   . Colon cancer Neg Hx   . Stomach cancer Neg Hx     Social History   Tobacco Use  . Smoking status: Never Smoker  . Smokeless tobacco: Never Used  Substance Use Topics  . Alcohol use: No  . Drug use: No    Prior to Admission medications   Medication Sig Start Date End Date Taking? Authorizing Provider  buPROPion (WELLBUTRIN SR) 150 MG 12 hr tablet Take 150 mg by  mouth 2 (two) times daily.  06/28/11   [provider]  carboxymethylcellul-glycerin (REFRESH REPAIR) 0.5-0.9 % ophthalmic solution Place 1 drop into both eyes 3 (three) times daily.    [provider]  cholecalciferol (VITAMIN D) 1000 UNITS tablet Take 1,000 Units by mouth daily.    [provider]  docusate sodium (COLACE) 100 MG capsule Take 100 mg by mouth daily as needed for mild constipation.     [provider]  ferrous sulfate 325 (65 FE) MG EC tablet Take 325 mg by mouth daily with breakfast.    [provider]  gabapentin (NEURONTIN) 300 MG capsule Take 1 tablet PO at 4 PM  and 1 tablet at 10 PM 06/20/16   Ward Givens, NP  HYDROcodone-acetaminophen (NORCO/VICODIN) 5-325 MG tablet Take 1 tablet by mouth every 6 (six) hours as needed for moderate pain.    [provider]  ipratropium (ATROVENT) 0.03 % nasal spray Place 2 sprays into both nostrils 2 (two) times daily.     [provider]  lovastatin (MEVACOR) 20 MG tablet Take 10 mg by mouth at bedtime.  07/22/12   [provider]  Magnesium 250 MG TABS Take 250 mg by mouth daily.     [provider]  methocarbamol (ROBAXIN) 500 MG tablet Take 500 mg by mouth every 6 (six) hours as needed for muscle spasms.     [provider]  Multiple Minerals-Vitamins (CITRACAL PLUS BONE DENSITY) TABS Take 1 tablet by mouth 2 (two) times daily.     [provider]  Multiple Vitamins-Minerals (CENTRUM ADULTS PO) Take 1 tablet by mouth daily.    [provider]  Multiple Vitamins-Minerals (PRESERVISION AREDS 2 PO) Take 1 capsule by mouth 2 (two) times daily.    [provider]  omeprazole (PRILOSEC) 40 MG capsule Take 1 capsule (40 mg total) by mouth 2 (two) times daily. 12/12/18   [name], MD  Polyethylene Glycol 3350 (MIRALAX PO) Take 1 Scoop by mouth daily.     [provider]  propranolol (INDERAL) 10 MG tablet Take 10 mg  by mouth daily.     [provider]  rOPINIRole (REQUIP) 4 MG tablet Take 4 mg by mouth at bedtime.    [provider]  Tamsulosin HCl (FLOMAX) 0.4 MG CAPS Take 0.4 mg by mouth 2 (two) times daily.  08/30/11   [provider]  traMADol (ULTRAM) 50 MG tablet Take 100 mg by mouth 2 (two) times daily. Hold for sedation    [provider]    Allergies Celebrex [celecoxib] and Flexeril [cyclobenzaprine]   REVIEW OF SYSTEMS  Negative except as noted here or in the History of  Present Illness.   PHYSICAL EXAMINATION  Initial Vital Signs Blood pressure (!) 150/68, pulse 70, temperature 98.1 F (36.7 C), temperature source Oral, resp. rate 14, height 5\' 6"  (1.676 m), weight 76.2 kg, SpO2 95 %.  Examination General: Well-developed, well-nourished male in no acute distress; appearance consistent with age of record HENT: normocephalic; atraumatic Eyes: pupils equal, round and reactive to light; extraocular muscles intact; bilateral pseudophakia Neck: supple Heart: regular rate and rhythm Lungs: clear to auscultation bilaterally Abdomen: soft; distended, tender bladder; bowel sounds present GU: [name] catheter with minimal bloody drainage in leg bag Extremities: No deformity; full range of motion; pulses normal Neurologic: Awake, alert and oriented; motor function intact in all extremities and symmetric; no facial droop Skin: Warm and dry Psychiatric: Grimacing   RESULTS  Summary of this visit's results, reviewed by myself:   EKG Interpretation  Date/Time:    Ventricular Rate:    PR Interval:    QRS Duration:   QT Interval:    QTC Calculation:   R Axis:     Text Interpretation:        Laboratory Studies: Results for orders placed or performed during the hospital encounter of 12/12/18 (from the past 24 hour(s))  Urinalysis, Routine w reflex microscopic     Status: Abnormal   Collection Time: 12/13/18 12:29 AM  Result Value Ref Range   Color,  Urine RED (A) YELLOW   APPearance CLOUDY (A) CLEAR   Specific Gravity, Urine 1.011 1.005 - 1.030   pH 6.0 5.0 - 8.0   Glucose, UA NEGATIVE NEGATIVE mg/dL   Hgb urine dipstick MODERATE (A) NEGATIVE   Bilirubin Urine NEGATIVE NEGATIVE   Ketones, ur NEGATIVE NEGATIVE mg/dL   Protein, ur 100 (A) NEGATIVE mg/dL   Nitrite NEGATIVE NEGATIVE   Leukocytes, UA MODERATE (A) NEGATIVE   RBC / HPF >50 (H) 0 - 5 RBC/hpf   WBC, UA >50 (H) 0 - 5 WBC/hpf   Bacteria, UA FEW (A) NONE SEEN   Imaging Studies: No results found.  ED COURSE and MDM  Nursing notes and initial vitals signs, including pulse oximetry, reviewed.  Vitals:   12/12/18 2342 12/13/18 0008 12/13/18 0030  BP: (!) 150/68  121/74  Pulse: 70  61  Resp: 14  15  Temp: 98.1 F (36.7 C)    TempSrc: Oral    SpO2: 93% 95% 93%  Weight: 76.2 kg    Height: 5\' 6"  (1.676 m)     12:44 AM Nursing staff irrigated the [name] and obtained about 400 mL of dark urine.  Patient's discomfort has been relieved and his bladder is deflated and nontender.  1:26 AM Urine sent for culture.  Will start on Keflex for possible hemorrhagic cystitis.  As noted above patient has follow-up appointment with urology on the 20th of this month.  PROCEDURES    ED DIAGNOSES     ICD-10-CM   1. Obstructed [name] catheter, initial encounter ([place]) T83.091A   2. Gross hematuria R31.0        Charles [name], Charles Reichmann, MD 12/13/18 [phone]

## 2018-12-14 LAB — URINE CULTURE: Culture: >=100000 — AB

## 2018-12-19 ENCOUNTER — Encounter (INDEPENDENT_AMBULATORY_CARE_PROVIDER_SITE_OTHER): Payer: Self-pay | Admitting: Orthopaedic Surgery

## 2018-12-19 ENCOUNTER — Ambulatory Visit (INDEPENDENT_AMBULATORY_CARE_PROVIDER_SITE_OTHER): Payer: Medicare Other | Admitting: Orthopaedic Surgery

## 2018-12-19 DIAGNOSIS — M79674 Pain in right toe(s): Secondary | ICD-10-CM | POA: Diagnosis not present

## 2018-12-19 MED ORDER — MUPIROCIN 2 % EX OINT: TOPICAL_OINTMENT | CUTANEOUS | 2 refills | Status: AC

## 2018-12-19 NOTE — Progress Notes (Signed)
Office Visit Note   Patient: Charles [name]           Date of Birth: Mar 04, 1931           MRN: [id number] Visit Date: 12/19/2018              Requested by: Sandrea Hughs, NP [address] [place], [place] [address] PCP: Sandrea Hughs, NP   Assessment & Plan: Visit Diagnoses:  1. Great toe pain, right     Plan: Impression is right great toe debrided callus.  We will continue to monitor this.  The patient will continue to apply mupirocin twice daily.  He will follow-up with [place] in 2 weeks time for recheck.  Call with concerning questions in the meantime.  Follow-Up Instructions: Return in about 2 weeks (around 01/02/2019).   Orders:  No orders of the defined types were placed in this encounter.  Meds ordered this encounter  Medications  . mupirocin ointment (BACTROBAN) 2 %    Sig: Apply to affected area 2 times daily    Dispense:  22 g    Refill:  2      Procedures: No procedures performed   Clinical Data: No additional findings.   Subjective: Chief Complaint  Patient presents with  . Right Great Toe - Follow-up    HPI patient is a pleasant 85 year old gentleman who presents to our clinic today for follow-up of his right great toe.  He was seen in our office about 2 weeks ago where a callus was debrided.  He has been applying mupirocin twice daily.  He notes improvement of symptoms following debridement.  He has not had any drainage over the past few days.  No fevers or chills.  Review of Systems as detailed in HPI.  All others reviewed and are negative.   Objective: Vital Signs: There were no vitals taken for this visit.  Physical Exam well-developed well-nourished gentleman no acute distress.  Alert and oriented x3  Ortho Exam examination of his right great toe shows a healing debrided callus.  No drainage.  No surrounding erythema.  Good distal pulses.  Decreased sensation.  Specialty Comments:  No specialty comments available.  Imaging: No new  imaging   PMFS History: Patient Active Problem List   Diagnosis Date Noted  . Great toe pain, right 12/05/2018  . Sacral insufficiency fracture 07/04/2018  . Unsteady gait 07/04/2018  . Mild cognitive impairment 07/04/2018  . Neuropathy 07/04/2018  . CAD (coronary artery disease) 05/29/2018  . AVM (arteriovenous malformation) of colon   . Radiation proctitis   . Toe amputation status, right 03/01/2018  . Diverticulosis of colon with hemorrhage   . Acute blood loss anemia   . History of back surgery 10/03/2017  . Rectal bleeding 10/03/2017  . Hematochezia   . Lower GI bleeding 09/29/2017  . Treatment-emergent central sleep apnea 06/25/2017  . Chronic bilateral low back pain with bilateral sciatica 01/17/2017  . Post laminectomy syndrome 01/17/2017  . Chronic pain syndrome 01/17/2017  . Sacroiliitis ([place]) 01/17/2017  . Piriformis syndrome of right side 01/17/2017  . Malignant neoplasm of prostate ([place]) 07/19/2016  . Fixation hardware in spine 05/15/2016  . Hypoglycemic reaction 02/02/2015  . Tremor, essential 12/11/2014  . OSA on CPAP 04/24/2014  . 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 Hypersomnia with sleep apnea, unspecified 04/24/2014  . Depression 04/18/2013  . BPH (benign prostatic hyperplasia) 04/18/2013  . Impingement syndrome of right shoulder 04/14/2013    Class:  Acute  . Scoliosis or kyphoscoliosis 04/08/2013    Class: Chronic  . Lumbosacral spondylosis without myelopathy 04/08/2013    Class: Chronic  . Acute respiratory failure ([place]) 04/08/2013  . Essential hypertension 04/08/2013  . Lymphocytosis   . Diaphragmatic hernia 11/21/2010  . DIVERTICULAR DISEASE 11/21/2010  . BARRETT'S ESOPHAGUS 02/22/2010  . GERD 07/20/2008  . INGUINAL HERNIA, LEFT, SMALL 07/20/2008  . OTH VENTRAL HERN W/O MENTION OBSTRUCTION/GANGREN 07/20/2008  . CONSTIPATION 07/20/2008  . DYSPHAGIA 07/20/2008   Past Medical History:  Diagnosis Date  . Anemia   . Arthritis   . AVM  (arteriovenous malformation)   . Barrett's esophagus   . BPH (benign prostatic hyperplasia)   . CAD (coronary artery disease)    only on stress test; never had MI, PCI or CABG.  In 2008, LVEF was reportedly normal. pt. denies  . Cataract   . Constipation   . CPAP (continuous positive airway pressure) dependence   . Depression   . Diverticulosis    diverticular bleeding  . Diverticulosis   . GERD (gastroesophageal reflux disease)   . History of radiation therapy   . Hyperlipidemia   . Hypoglycemia   . Lymphocytosis 06/2012  . Mastodynia   . Murmur    slight  . Nasal congestion   . Osteoporosis   . Osteoporosis   . Prostate cancer ([place])   . Sleep apnea    wears CPAP  . Urinary retention with incomplete bladder emptying    pt uses bathroom and within 30 mins needs to go again  . Vitamin D deficiency     Family History  Problem Relation Age of Onset  . Prostate cancer Father   . CAD Mother   . Arthritis-Osteo Brother   . Colon cancer Neg Hx   . Stomach cancer Neg Hx     Past Surgical History:  Procedure Laterality Date  . AMPUTATION TOE Right 09/20/2017   Procedure: Right 4th toe amputation;  Surgeon: Wylene Simmer, MD;  Location: [facility];  Service: Orthopedics;  Laterality: Right;  foot block  . BACK SURGERY  04/2013  . back surgury     f  . BLEPHAROPLASTY Bilateral 02/23/15  . CATARACT EXTRACTION    . COLONOSCOPY    . COLONOSCOPY N/A 10/02/2017   Procedure: COLONOSCOPY;  Surgeon: Doran Stabler, MD;  Location: [facility];  Service: Gastroenterology;  Laterality: N/A;  . COLONOSCOPY WITH PROPOFOL N/A 10/01/2017   Procedure: COLONOSCOPY WITH PROPOFOL;  Surgeon: Doran Stabler, MD;  [facility];  Service: Gastroenterology;  Laterality: N/A;  . COLONOSCOPY WITH PROPOFOL N/A 03/07/2018   Procedure: COLONOSCOPY WITH PROPOFOL;  Surgeon: [name], MD;  Location: [facility];  Service: Endoscopy;  Laterality: N/A;  .  ESOPHAGOGASTRODUODENOSCOPY (EGD) WITH PROPOFOL N/A 10/01/2017   Procedure: ESOPHAGOGASTRODUODENOSCOPY (EGD) WITH PROPOFOL;  Surgeon: Doran Stabler, MD;  Location[facility];  Service: Gastroenterology;  Laterality: N/A;  . EYE SURGERY Bilateral   . FINGER SURGERY Right    right thumb  . HARDWARE REMOVAL Right 05/15/2016   Procedure: Right Lumbar Two-Lumbar Five Removal of Hardware;  Surgeon: Kristeen Miss, MD;  Location: [facility];  Service: Neurosurgery;  Laterality: Right;  Right L2-5 removal of hardware  . INGUINAL HERNIA REPAIR     x4  . IR RADIOLOGIST EVAL & MGMT  07/10/2018  . IR RADIOLOGIST EVAL & MGMT  09/05/2018  . IR SACROPLASTY BILATERAL  07/15/2018  . REPAIR SPIGELIAN HERNIA  2011  . [place] ,2014  . TONSILLECTOMY    . UPPER GASTROINTESTINAL ENDOSCOPY    . VASECTOMY     Social History   Occupational History  . 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 Smoking status: Never Smoker  . Smokeless tobacco: Never Used  Substance and Sexual Activity  . Alcohol use: No  . Drug use: No  . Sexual activity: Never

## 2018-12-23 DIAGNOSIS — R338 Other retention of urine: Secondary | ICD-10-CM | POA: Diagnosis not present

## 2018-12-26 DIAGNOSIS — D1801 Hemangioma of skin and subcutaneous tissue: Secondary | ICD-10-CM | POA: Diagnosis not present

## 2018-12-26 DIAGNOSIS — L853 Xerosis cutis: Secondary | ICD-10-CM | POA: Diagnosis not present

## 2018-12-26 DIAGNOSIS — D692 Other nonthrombocytopenic purpura: Secondary | ICD-10-CM | POA: Diagnosis not present

## 2018-12-26 DIAGNOSIS — L570 Actinic keratosis: Secondary | ICD-10-CM | POA: Diagnosis not present

## 2018-12-26 DIAGNOSIS — Z85828 Personal history of other malignant neoplasm of skin: Secondary | ICD-10-CM | POA: Diagnosis not present

## 2018-12-26 DIAGNOSIS — L821 Other seborrheic keratosis: Secondary | ICD-10-CM | POA: Diagnosis not present

## 2019-01-01 ENCOUNTER — Encounter: Payer: Self-pay | Admitting: Adult Health

## 2019-01-01 ENCOUNTER — Ambulatory Visit (INDEPENDENT_AMBULATORY_CARE_PROVIDER_SITE_OTHER): Payer: Medicare Other | Admitting: Adult Health

## 2019-01-01 VITALS — BP 121/62 | HR 58 | Ht 66.0 in | Wt 168.8 lb

## 2019-01-01 DIAGNOSIS — G4733 Obstructive sleep apnea (adult) (pediatric): Secondary | ICD-10-CM | POA: Diagnosis not present

## 2019-01-01 DIAGNOSIS — Z9989 Dependence on other enabling machines and devices: Secondary | ICD-10-CM

## 2019-01-01 NOTE — Progress Notes (Signed)
Community  Message sent to [employer] re: increase CPAP pressure. Received reply from [employer] ; they got message for order.

## 2019-01-01 NOTE — Patient Instructions (Signed)
Your Plan:  Continue using CPAP nightly and greater than 4 hours each night Increase pressure 8-20 Continue gabapentin and requip for restless legs If your symptoms worsen or you develop new symptoms please let us know.   Thank you for coming to see [place] at [facility]. I hope we have been able to provide you high quality care today.  You may receive a patient satisfaction survey over the next few weeks. We would appreciate your feedback and comments so that we may continue to improve ourselves and the health of our patients.

## 2019-01-01 NOTE — Progress Notes (Signed)
PATIENT: Charles [name] DOB: October 20, 1931  REASON FOR VISIT: follow up HISTORY FROM: patient  HISTORY OF PRESENT ILLNESS:  Today 01/01/19 Charles [name] is a 85 year old male patient who presents today for follow up of OSA with CPAP. He is using mask daily but does report that he has had some trouble with an ill fitting mask over the past few months. He was seen by [facility] and had a refitting about a month ago. He reports that he has felt better since refitting. Compliance reports shows he is using machine 30/30 days for compliance of 100%. He is using machine  Greater than 4 hours 29/30 days for compliance of 97%. He is using machine an average of 7 hours and 2 minutes. His AHI is 10.4 on 8 to 16 cmH2O. His leak in the 95th percentile is 23.8. His average pressure is 15.4. he does feel better. Epworth Sleepiness Scale is 7. He continues ropinirole and gabapentin for restless legs and chronic back pain.   HISTORY 06/12/18 Charles [name] is an 85 year old male with a history of obstructive sleep apnea, restless leg syndrome and chronic back pain.  He returns today for follow-up.  He remains on gabapentin for neuropathy.  He reports that he does not have any burning in the lower extremities.  He does report numbness but mainly in the right lower extremity.  Denies any significant changes with his gait or balance.  He does use a walker when ambulating.  Denies any falls.  His CPAP download indicates that he uses machine 25 out of 30 days for compliance of 83%.  He uses machine greater than 4 hours every night that he use the machine.  On average he uses his machine 8 hours and 15 minutes.  His residual AHI is 9.5 on 8 to 15 cm of water.  His leak in the 95th percentile is 23.5 L/min.  His average pressure is 14.7 cm of water.  The patient states that he does not feel the mask leaking at night he states that there are occasions that the tubing disconnect from the mask.  He denies any new neurological  symptoms.  He returns today for an evaluation.   REVIEW OF SYSTEMS: Out of a complete 14 system review of symptoms, the patient complains only of the following symptoms, restless legs, insomnia, pain, and all other reviewed systems are negative.  ALLERGIES: Allergies  Allergen Reactions  . Celebrex [Celecoxib] Other (See Comments)    Created stomach ulcers.  Marland Kitchen Flexeril [Cyclobenzaprine] Other (See Comments)    Caught in hiatal hernia and caused extreme pain    HOME MEDICATIONS: Outpatient Medications Prior to Visit  Medication Sig Dispense Refill  . acetaminophen (TYLENOL) 500 MG tablet Take 500 mg by mouth every 6 (six) hours as needed.    Marland Kitchen buPROPion (WELLBUTRIN SR) 150 MG 12 hr tablet Take 150 mg by mouth 2 (two) times daily.     . carboxymethylcellul-glycerin (REFRESH REPAIR) 0.5-0.9 % ophthalmic solution Place 1 drop into both eyes 3 (three) times daily.    . cholecalciferol (VITAMIN D) 1000 UNITS tablet Take 1,000 Units by mouth daily.    Marland Kitchen docusate sodium (COLACE) 100 MG capsule Take 100 mg by mouth daily as needed for mild constipation.     . ferrous sulfate 325 (65 FE) MG EC tablet Take 325 mg by mouth daily with breakfast.    . gabapentin (NEURONTIN) 300 MG capsule Take 1 tablet PO at 4 PM  and 1  tablet at 10 PM 180 capsule 3  . HYDROcodone-acetaminophen (NORCO/VICODIN) 5-325 MG tablet Take 1 tablet by mouth every 6 (six) hours as needed for moderate pain.    Marland Kitchen ipratropium (ATROVENT) 0.03 % nasal spray Place 2 sprays into both nostrils 2 (two) times daily.     Marland Kitchen lovastatin (MEVACOR) 20 MG tablet Take 10 mg by mouth at bedtime.     . Magnesium 250 MG TABS Take 250 mg by mouth daily.     . methocarbamol (ROBAXIN) 500 MG tablet Take 500 mg by mouth every 6 (six) hours as needed for muscle spasms.     . Multiple Minerals-Vitamins (CITRACAL PLUS BONE DENSITY) TABS Take 1 tablet by mouth 2 (two) times daily.     . Multiple Vitamins-Minerals (CENTRUM ADULTS PO) Take 1 tablet by  mouth daily.    . Multiple Vitamins-Minerals (PRESERVISION AREDS 2 PO) Take 1 capsule by mouth 2 (two) times daily.    Marland Kitchen omeprazole (PRILOSEC) 40 MG capsule Take 1 capsule (40 mg total) by mouth 2 (two) times daily. 60 capsule 11  . Polyethylene Glycol 3350 (MIRALAX PO) Take 1 Scoop by mouth daily.     . propranolol (INDERAL) 10 MG tablet Take 10 mg by mouth daily.     Marland Kitchen rOPINIRole (REQUIP) 4 MG tablet Take 4 mg by mouth at bedtime.    . Tamsulosin HCl (FLOMAX) 0.4 MG CAPS Take 0.4 mg by mouth 2 (two) times daily.     . traMADol (ULTRAM) 50 MG tablet Take 100 mg by mouth 2 (two) times daily. Hold for sedation    . mupirocin ointment (BACTROBAN) 2 % Apply to affected area 2 times daily (Patient not taking: Reported on 01/01/2019) 22 g 2  . cephALEXin (KEFLEX) 500 MG capsule Take 1 capsule (500 mg total) by mouth 4 (four) times daily. 28 capsule 0   No facility-administered medications prior to visit.     PAST MEDICAL HISTORY: Past Medical History:  Diagnosis Date  . Anemia   . Arthritis   . AVM (arteriovenous malformation)   . Barrett's esophagus   . BPH (benign prostatic hyperplasia)   . CAD (coronary artery disease)    only on stress test; never had MI, PCI or CABG.  In 2008, LVEF was reportedly normal. pt. denies  . Cataract   . Constipation   . CPAP (continuous positive airway pressure) dependence   . Depression   . Diverticulosis    diverticular bleeding  . Diverticulosis   . GERD (gastroesophageal reflux disease)   . History of radiation therapy   . Hyperlipidemia   . Hypoglycemia   . Lymphocytosis 06/2012  . Mastodynia   . Murmur    slight  . Nasal congestion   . Osteoporosis   . Osteoporosis   . Prostate cancer ([place])   . Sleep apnea    wears CPAP  . Urinary retention with incomplete bladder emptying    pt uses bathroom and within 30 mins needs to go again  . Vitamin D deficiency     PAST SURGICAL HISTORY: Past Surgical History:  Procedure Laterality Date  .  AMPUTATION TOE Right 09/20/2017   Procedure: Right 4th toe amputation;  Surgeon: Wylene Simmer, MD;  Location: [facility];  Service: Orthopedics;  Laterality: Right;  foot block  . BACK SURGERY  04/2013  . back surgury     f  . BLEPHAROPLASTY Bilateral 02/23/15  . CATARACT EXTRACTION    . COLONOSCOPY    .  COLONOSCOPY N/A 10/02/2017   Procedure: COLONOSCOPY;  Surgeon: Doran Stabler, MD;  Location: [facility];  Service: Gastroenterology;  Laterality: N/A;  . COLONOSCOPY WITH PROPOFOL N/A 10/01/2017   Procedure: COLONOSCOPY WITH PROPOFOL;  Surgeon: Doran Stabler, MD;  Location: [facility];  Service: Gastroenterology;  Laterality: N/A;  . COLONOSCOPY WITH PROPOFOL N/A 03/07/2018   Procedure: COLONOSCOPY WITH PROPOFOL;  Surgeon: [name], MD;  Location: [facility];  Service: Endoscopy;  Laterality: N/A;  . ESOPHAGOGASTRODUODENOSCOPY (EGD) WITH PROPOFOL N/A 10/01/2017   Procedure: ESOPHAGOGASTRODUODENOSCOPY (EGD) WITH PROPOFOL;  Surgeon: Doran Stabler, MD;  [facility];  Service: Gastroenterology;  Laterality: N/A;  . EYE SURGERY Bilateral   . FINGER SURGERY Right    right thumb  . HARDWARE REMOVAL Right 05/15/2016   Procedure: Right Lumbar Two-Lumbar Five Removal of Hardware;  Surgeon: Kristeen Miss, MD;  Location: [facility];  Service: Neurosurgery;  Laterality: Right;  Right L2-5 removal of hardware  . INGUINAL HERNIA REPAIR     x4  . IR RADIOLOGIST EVAL & MGMT  07/10/2018  . IR RADIOLOGIST EVAL & MGMT  09/05/2018  . IR SACROPLASTY BILATERAL  07/15/2018  . REPAIR SPIGELIAN HERNIA  2011  . [place] ,2014  . TONSILLECTOMY    . UPPER GASTROINTESTINAL ENDOSCOPY    . VASECTOMY      FAMILY HISTORY: Family History  Problem Relation Age of Onset  . Prostate cancer Father   . CAD Mother   . Arthritis-Osteo Brother   . Colon cancer Neg Hx   . Stomach cancer Neg Hx     SOCIAL HISTORY: Social History   Socioeconomic History  .  Marital status: Married    Spouse name: [name]  . Number of children: 3  . Years of education: College  . Highest education level: Not on file  Occupational History  . 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 Financial resource strain: Not hard at all  . Food insecurity:    Worry: Never true    Inability: Never true  . Transportation needs:    Medical: No    Non-medical: No  Tobacco Use  . Smoking status: Never Smoker  . Smokeless tobacco: Never Used  Substance and Sexual Activity  . Alcohol use: No  . Drug use: No  . Sexual activity: Never  Lifestyle  . Physical activity:    Days per week: 7 days    Minutes per session: 10 min  . Stress: Not at all  Relationships  . Social connections:    Talks on phone: More than three times a week    Gets together: More than three times a week    Attends religious service: Never    Active member of club or organization: No    Attends meetings of clubs or organizations: Never    Relationship status: Married  . Intimate partner violence:    Fear of current or ex partner: No    Emotionally abused: No    Physically abused: No    Forced sexual activity: No  Other Topics Concern  . Not on file  Social History Narrative   Patient is married [name]).   Patient is a retired Arboriculturist.   Patient lives in [facility] living facility.   Patient has three adult children.   Patient does not drink any caffeine.   Patient is right-handed.      PHYSICAL  EXAM  Vitals:   01/01/19 1030  BP: 121/62  Pulse: (!) 58  Weight: 168 lb 12.8 oz (76.6 kg)  Height: 5\' 6"  (1.676 m)   Body mass index is 27.25 kg/m.  Generalized: Well developed, in no acute distress   Neurological examination  Mentation: Alert oriented to time, place, history taking. Follows all commands speech and language fluent Cranial nerve II-XII: Pupils were equal round reactive to light. Extraocular movements were full,  visual field were full on confrontational test. Facial sensation and strength were normal. Uvula tongue midline. Head turning and shoulder shrug  were normal and symmetric. Motor: The motor testing reveals 5 over 5 strength of all 4 extremities. Good symmetric motor tone is noted throughout.  Sensory: Sensory testing is intact to soft touch on all 4 extremities. No evidence of extinction is noted.  Coordination: Cerebellar testing reveals good finger-nose-finger and heel-to-shin bilaterally.  Gait and station: Gait is normal. Tandem gait is normal. Romberg is negative. No drift is seen.  Reflexes: Deep tendon reflexes are symmetric and normal bilaterally.   DIAGNOSTIC DATA (LABS, IMAGING, TESTING) - I reviewed patient records, labs, notes, testing and imaging myself where available.  Lab Results  Component Value Date   WBC 6.7 08/20/2018   HGB 10.4 (A) 08/20/2018   HCT 32 (A) 08/20/2018   MCV 85 08/06/2018   PLT 259 08/20/2018      Component Value Date/Time   NA 137 08/06/2018   K 4.0 08/06/2018   CL 100 07/15/2018 0704   CL 104 05/30/2018   CO2 22 07/15/2018 0704   CO2 26 05/30/2018   GLUCOSE 135 (H) 07/15/2018 0704   BUN 13 08/06/2018   CREATININE 0.69 08/06/2018   CALCIUM 8.2 08/06/2018   PROT 5.3 08/06/2018   PROT 6.2 05/30/2018   ALBUMIN 2.8 08/06/2018   AST 27 08/06/2018   ALT 11 08/06/2018   ALT 67 07/08/2018   ALKPHOS 90 08/06/2018   BILITOT 0.3 08/06/2018   GFRNONAA >60 07/15/2018 0704   GFRNONAA 89 07/08/2018   GFRAA >60 07/15/2018 0704   Lab Results  Component Value Date   CHOL 158 08/29/2018   HDL 43 08/29/2018   LDLCALC 98 08/29/2018   TRIG 76 08/29/2018   Lab Results  Component Value Date   HGBA1C 5.2 08/29/2018   Lab Results  Component Value Date   VITAMINB12 314 07/25/2013   Lab Results  Component Value Date   TSH 2.35 07/08/2018      ASSESSMENT AND PLAN 85 y.o. year old male  has a past medical history of Anemia, Arthritis, AVM  (arteriovenous malformation), Barrett's esophagus, BPH (benign prostatic hyperplasia), CAD (coronary artery disease), Cataract, Constipation, CPAP (continuous positive airway pressure) dependence, Depression, Diverticulosis, Diverticulosis, GERD (gastroesophageal reflux disease), History of radiation therapy, Hyperlipidemia, Hypoglycemia, Lymphocytosis (06/2012), Mastodynia, Murmur, Nasal congestion, Osteoporosis, Osteoporosis, Prostate cancer ([place]), Sleep apnea, Urinary retention with incomplete bladder emptying, and Vitamin D deficiency. here with   1. OSA on CPAP  2. Restless legs  He is doing well. He feels that sleep has improved. We will adjust pressures to 8-20cmH2O. I have encouraged him to continue using CPAP nightly and greater than 4 hours each night. Continue gabapentin and ropinirole as prescribed for restless legs and pain. We will follow up in 6 months.    Ward Givens, MSN, NP-C 01/01/2019, 10:43 AM [facility] [address], [place], [place] [address] [phone]

## 2019-01-01 NOTE — Progress Notes (Signed)
I have read the note, and I agree with the clinical assessment and plan.  Charles K Willis   

## 2019-01-02 ENCOUNTER — Encounter (INDEPENDENT_AMBULATORY_CARE_PROVIDER_SITE_OTHER): Payer: Self-pay | Admitting: Orthopaedic Surgery

## 2019-01-02 ENCOUNTER — Ambulatory Visit (INDEPENDENT_AMBULATORY_CARE_PROVIDER_SITE_OTHER): Payer: Medicare Other | Admitting: Orthopaedic Surgery

## 2019-01-02 DIAGNOSIS — L84 Corns and callosities: Secondary | ICD-10-CM | POA: Diagnosis not present

## 2019-01-02 NOTE — Progress Notes (Signed)
Office Visit Note   Patient: Charles [name]           Date of Birth: [birth year]           MRN: [id number] Visit Date: 01/02/2019              Requested by: Sandrea Hughs, NP [address] [place], [place] [address] PCP: Leanna Battles, MD   Assessment & Plan: Visit Diagnoses:  1. Callus     Plan: Impression is stable right great toe scab status post callus paring.  I instructed him to put mupirocin around the edges once a day.  Eventually the scab will fall off once everything heals.  Everything looks good from my standpoint.  He can follow-up with [place] as needed.  Follow-Up Instructions: Return if symptoms worsen or fail to improve.   Orders:  No orders of the defined types were placed in this encounter.  No orders of the defined types were placed in this encounter.     Procedures: No procedures performed   Clinical Data: No additional findings.   Subjective: Chief Complaint  Patient presents with  . Right Great Toe - Pain, Follow-up    Charles [name] follows up for wound check.  He has a callus on the right great toe.  He mainly has pain with weightbearing.  Otherwise he is doing well.   Review of Systems   Objective: Vital Signs: There were no vitals taken for this visit.  Physical Exam  Ortho Exam Right great toe shows a dry stable scab without any evidence of infection. Specialty Comments:  No specialty comments available.  Imaging: No results found.   PMFS History: Patient Active Problem List   Diagnosis Date Noted  . Great toe pain, right 12/05/2018  . Sacral insufficiency fracture 07/04/2018  . Unsteady gait 07/04/2018  . Mild cognitive impairment 07/04/2018  . Neuropathy 07/04/2018  . CAD (coronary artery disease) 05/29/2018  . AVM (arteriovenous malformation) of colon   . Radiation proctitis   . Toe amputation status, right 03/01/2018  . Diverticulosis of colon with hemorrhage   . Acute blood loss anemia   . History of back  surgery 10/03/2017  . Rectal bleeding 10/03/2017  . Hematochezia   . Lower GI bleeding 09/29/2017  . Treatment-emergent central sleep apnea 06/25/2017  . Chronic bilateral low back pain with bilateral sciatica 01/17/2017  . Post laminectomy syndrome 01/17/2017  . Chronic pain syndrome 01/17/2017  . Sacroiliitis ([place]) 01/17/2017  . Piriformis syndrome of right side 01/17/2017  . Malignant neoplasm of prostate ([place]) 07/19/2016  . Fixation hardware in spine 05/15/2016  . Hypoglycemic reaction 02/02/2015  . Tremor, essential 12/11/2014  . OSA on CPAP 04/24/2014  . 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 Hypersomnia with sleep apnea, unspecified 04/24/2014  . Depression 04/18/2013  . BPH (benign prostatic hyperplasia) 04/18/2013  . Impingement syndrome of right shoulder 04/14/2013    Class: Acute  . Scoliosis or kyphoscoliosis 04/08/2013    Class: Chronic  . Lumbosacral spondylosis without myelopathy 04/08/2013    Class: Chronic  . Acute respiratory failure ([place]) 04/08/2013  . Essential hypertension 04/08/2013  . Lymphocytosis   . Diaphragmatic hernia 11/21/2010  . DIVERTICULAR DISEASE 11/21/2010  . BARRETT'S ESOPHAGUS 02/22/2010  . GERD 07/20/2008  . INGUINAL HERNIA, LEFT, SMALL 07/20/2008  . OTH VENTRAL HERN W/O MENTION OBSTRUCTION/GANGREN 07/20/2008  . CONSTIPATION 07/20/2008  . DYSPHAGIA 07/20/2008   Past Medical History:  Diagnosis Date  . Anemia   .  Arthritis   . AVM (arteriovenous malformation)   . Barrett's esophagus   . BPH (benign prostatic hyperplasia)   . CAD (coronary artery disease)    only on stress test; never had MI, PCI or CABG.  In 2008, LVEF was reportedly normal. pt. denies  . Cataract   . Constipation   . CPAP (continuous positive airway pressure) dependence   . Depression   . Diverticulosis    diverticular bleeding  . Diverticulosis   . GERD (gastroesophageal reflux disease)   . History of radiation therapy   . Hyperlipidemia   .  Hypoglycemia   . Lymphocytosis 06/2012  . Mastodynia   . Murmur    slight  . Nasal congestion   . Osteoporosis   . Osteoporosis   . Prostate cancer ([place])   . Sleep apnea    wears CPAP  . Urinary retention with incomplete bladder emptying    pt uses bathroom and within 30 mins needs to go again  . Vitamin D deficiency     Family History  Problem Relation Age of Onset  . Prostate cancer Father   . CAD Mother   . Arthritis-Osteo Brother   . Colon cancer Neg Hx   . Stomach cancer Neg Hx     Past Surgical History:  Procedure Laterality Date  . AMPUTATION TOE Right 09/20/2017   Procedure: Right 4th toe amputation;  Surgeon: Charles Simmer, MD;  Location: [facility];  Service: Orthopedics;  Laterality: Right;  foot block  . BACK SURGERY  04/2013  . back surgury     f  . BLEPHAROPLASTY Bilateral 02/23/15  . CATARACT EXTRACTION    . COLONOSCOPY    . COLONOSCOPY N/A 10/02/2017   Procedure: COLONOSCOPY;  Surgeon: Charles Stabler, MD;  Location: [facility];  Service: Gastroenterology;  Laterality: N/A;  . COLONOSCOPY WITH PROPOFOL N/A 10/01/2017   Procedure: COLONOSCOPY WITH PROPOFOL;  Surgeon: Charles Stabler, MD;  [facility];  Service: Gastroenterology;  Laterality: N/A;  . COLONOSCOPY WITH PROPOFOL N/A 03/07/2018   Procedure: COLONOSCOPY WITH PROPOFOL;  Surgeon: Charles Artist, MD;  [facility];  Service: Endoscopy;  Laterality: N/A;  . ESOPHAGOGASTRODUODENOSCOPY (EGD) WITH PROPOFOL N/A 10/01/2017   Procedure: ESOPHAGOGASTRODUODENOSCOPY (EGD) WITH PROPOFOL;  Surgeon: Charles Stabler, MD;  [facility];  Service: Gastroenterology;  Laterality: N/A;  . EYE SURGERY Bilateral   . FINGER SURGERY Right    right thumb  . HARDWARE REMOVAL Right 05/15/2016   Procedure: Right Lumbar Two-Lumbar Five Removal of Hardware;  Surgeon: Charles Miss, MD;  Location: [facility];  Service: Neurosurgery;  Laterality: Right;  Right L2-5 removal of  hardware  . INGUINAL HERNIA REPAIR     x4  . IR RADIOLOGIST EVAL & MGMT  07/10/2018  . IR RADIOLOGIST EVAL & MGMT  09/05/2018  . IR SACROPLASTY BILATERAL  07/15/2018  . REPAIR SPIGELIAN HERNIA  2011  . Charles [name] ,2014  . TONSILLECTOMY    . UPPER GASTROINTESTINAL ENDOSCOPY    . VASECTOMY     Social History   Occupational History  . 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 Smoking status: Never Smoker  . Smokeless tobacco: Never Used  Substance and Sexual Activity  . Alcohol use: No  . Drug use: No  . Sexual activity: Never

## 2019-01-09 ENCOUNTER — Ambulatory Visit (INDEPENDENT_AMBULATORY_CARE_PROVIDER_SITE_OTHER): Payer: Medicare Other | Admitting: Orthopaedic Surgery

## 2019-01-09 ENCOUNTER — Encounter (INDEPENDENT_AMBULATORY_CARE_PROVIDER_SITE_OTHER): Payer: Self-pay | Admitting: Orthopaedic Surgery

## 2019-01-09 VITALS — Ht 66.0 in | Wt 168.8 lb

## 2019-01-09 DIAGNOSIS — M79674 Pain in right toe(s): Secondary | ICD-10-CM

## 2019-01-09 MED ORDER — SULFAMETHOXAZOLE-TRIMETHOPRIM 800-160 MG PO TABS: 1.0000 | ORAL_TABLET | Freq: Two times a day (BID) | ORAL | 0 refills | Status: DC

## 2019-01-09 MED ORDER — DOXYCYCLINE HYCLATE 100 MG PO TABS: 100.0000 mg | ORAL_TABLET | Freq: Two times a day (BID) | ORAL | 0 refills | Status: DC

## 2019-01-09 NOTE — [place] Notes (Signed)
Office Visit Note   Patient: Charles [name]           Date of Birth: 1931/11/20           MRN: [id number] Visit Date: 01/09/2019              Requested by: Leanna Battles, MD [place], [place] [id number] PCP: Leanna Battles, MD   Assessment & Plan: Visit Diagnoses:  1. Great toe pain, right     Plan: Impression is right great toe cellulitis and drainage concerning for osteomyelitis.  Prescription for Bactrim DS.  Urgent MRI to evaluate for osteomyelitis.  We will see the patient back after the MRI. Total face to face encounter time was greater than 25 minutes and over half of this time was spent in counseling and/or coordination of care.   Follow-Up Instructions: Return in about 10 days (around 01/19/2019).   Orders:  Orders Placed This Encounter  Procedures  . Arthrogram  . MR TOES RIGHT W WO CONTRAST   Meds ordered this encounter  Medications  . doxycycline (VIBRA-TABS) 100 MG tablet    Sig: Take 1 tablet (100 mg total) by mouth 2 (two) times daily.    Dispense:  28 tablet    Refill:  0  . sulfamethoxazole-trimethoprim (BACTRIM DS,SEPTRA DS) 800-160 MG tablet    Sig: Take 1 tablet by mouth 2 (two) times daily.    Dispense:  28 tablet    Refill:  0      Procedures: No procedures performed   Clinical Data: No additional findings.   Subjective: Chief Complaint  Patient presents with  . Right Foot - Pain, Follow-up    Charles [name] comes in today for evaluation of his right great toe.  He is status post paring of the callus by Dr. Sharol Given several weeks ago but this is recently gotten much worse in the last couple days.  It is now red and hot and swollen and painful to touch.  He denies any constitutional symptoms.   Review of Systems  Constitutional: Negative.   All other systems reviewed and are negative.    Objective: Vital Signs: Ht 5\' 6"  (1.676 m)   Wt 168 lb 12.8 oz (76.6 kg)   BMI 27.25 kg/m   Physical Exam Vitals signs and  nursing note reviewed.  Constitutional:      Appearance: He is well-developed.  Pulmonary:     Effort: Pulmonary effort is normal.  Abdominal:     Palpations: Abdomen is soft.  Skin:    General: Skin is warm.  Neurological:     Mental Status: He is alert and oriented to person, place, and time.  Psychiatric:        Behavior: Behavior normal.        Thought Content: Thought content normal.        Judgment: Judgment normal.     Ortho Exam Right great toe exam shows cellulitis and swelling and purulent drainage.  The toe is very tender to palpation. Specialty Comments:  No specialty comments available.  Imaging: No results found.   PMFS History: Patient Active Problem List   Diagnosis Date Noted  . Great toe pain, right 12/05/2018  . Sacral insufficiency fracture 07/04/2018  . Unsteady gait 07/04/2018  . Mild cognitive impairment 07/04/2018  . Neuropathy 07/04/2018  . CAD (coronary artery disease) 05/29/2018  . AVM (arteriovenous malformation) of colon   . Radiation proctitis   . Toe amputation status, right 03/01/2018  .  Diverticulosis of colon with hemorrhage   . Acute blood loss anemia   . History of back surgery 10/03/2017  . Rectal bleeding 10/03/2017  . Hematochezia   . Lower GI bleeding 09/29/2017  . Treatment-emergent central sleep apnea 06/25/2017  . Chronic bilateral low back pain with bilateral sciatica 01/17/2017  . Post laminectomy syndrome 01/17/2017  . Chronic pain syndrome 01/17/2017  . Sacroiliitis ([place]) 01/17/2017  . Piriformis syndrome of right side 01/17/2017  . Malignant neoplasm of prostate ([place]) 07/19/2016  . Fixation hardware in spine 05/15/2016  . Hypoglycemic reaction 02/02/2015  . Tremor, essential 12/11/2014  . OSA on CPAP 04/24/2014  . 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 Hypersomnia with sleep apnea, unspecified 04/24/2014  . Depression 04/18/2013  . BPH (benign prostatic hyperplasia) 04/18/2013  . Impingement syndrome of  right shoulder 04/14/2013    Class: Acute  . Scoliosis or kyphoscoliosis 04/08/2013    Class: Chronic  . Lumbosacral spondylosis without myelopathy 04/08/2013    Class: Chronic  . Acute respiratory failure ([place]) 04/08/2013  . Essential hypertension 04/08/2013  . Lymphocytosis   . Diaphragmatic hernia 11/21/2010  . DIVERTICULAR DISEASE 11/21/2010  . BARRETT'S ESOPHAGUS 02/22/2010  . GERD 07/20/2008  . INGUINAL HERNIA, LEFT, SMALL 07/20/2008  . OTH VENTRAL HERN W/O MENTION OBSTRUCTION/GANGREN 07/20/2008  . CONSTIPATION 07/20/2008  . DYSPHAGIA 07/20/2008   Past Medical History:  Diagnosis Date  . Anemia   . Arthritis   . AVM (arteriovenous malformation)   . Barrett's esophagus   . BPH (benign prostatic hyperplasia)   . CAD (coronary artery disease)    only on stress test; never had MI, PCI or CABG.  In 2008, LVEF was reportedly normal. pt. denies  . Cataract   . Constipation   . CPAP (continuous positive airway pressure) dependence   . Depression   . Diverticulosis    diverticular bleeding  . Diverticulosis   . GERD (gastroesophageal reflux disease)   . History of radiation therapy   . Hyperlipidemia   . Hypoglycemia   . Lymphocytosis 06/2012  . Mastodynia   . Murmur    slight  . Nasal congestion   . Osteoporosis   . Osteoporosis   . Prostate cancer ([place])   . Sleep apnea    wears CPAP  . Urinary retention with incomplete bladder emptying    pt uses bathroom and within 30 mins needs to go again  . Vitamin D deficiency     Family History  Problem Relation Age of Onset  . Prostate cancer Father   . CAD Mother   . Arthritis-Osteo Brother   . Colon cancer Neg Hx   . Stomach cancer Neg Hx     Past Surgical History:  Procedure Laterality Date  . AMPUTATION TOE Right 09/20/2017   Procedure: Right 4th toe amputation;  Surgeon: Wylene Simmer, MD;  Location: [facility];  Service: Orthopedics;  Laterality: Right;  foot block  . BACK SURGERY  04/2013  .  back surgury     f  . BLEPHAROPLASTY Bilateral 02/23/15  . CATARACT EXTRACTION    . COLONOSCOPY    . COLONOSCOPY N/A 10/02/2017   Procedure: COLONOSCOPY;  Surgeon: Doran Stabler, MD;  Location: [facility];  Service: Gastroenterology;  Laterality: N/A;  . COLONOSCOPY WITH PROPOFOL N/A 10/01/2017   Procedure: COLONOSCOPY WITH PROPOFOL;  Surgeon: Doran Stabler, MD;  Location: [facility];  Service: Gastroenterology;  Laterality: N/A;  . COLONOSCOPY WITH PROPOFOL N/A 03/07/2018  Procedure: COLONOSCOPY WITH PROPOFOL;  Surgeon: [name], MD;  Location: [facility];  Service: Endoscopy;  Laterality: N/A;  . ESOPHAGOGASTRODUODENOSCOPY (EGD) WITH PROPOFOL N/A 10/01/2017   Procedure: ESOPHAGOGASTRODUODENOSCOPY (EGD) WITH PROPOFOL;  Surgeon: Doran Stabler, MD;  [facility]: [facility];  Service: Gastroenterology;  Laterality: N/A;  . EYE SURGERY Bilateral   . FINGER SURGERY Right    right thumb  . HARDWARE REMOVAL Right 05/15/2016   Procedure: Right Lumbar Two-Lumbar Five Removal of Hardware;  Surgeon: Kristeen Miss, MD;  Location: [facility];  Service: Neurosurgery;  Laterality: Right;  Right L2-5 removal of hardware  . INGUINAL HERNIA REPAIR     x4  . IR RADIOLOGIST EVAL & MGMT  07/10/2018  . IR RADIOLOGIST EVAL & MGMT  09/05/2018  . IR SACROPLASTY BILATERAL  07/15/2018  . REPAIR SPIGELIAN HERNIA  2011  . [place] ,2014  . TONSILLECTOMY    . UPPER GASTROINTESTINAL ENDOSCOPY    . VASECTOMY     Social History   Occupational History  . 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 Smoking status: Never Smoker  . Smokeless tobacco: Never Used  Substance and Sexual Activity  . Alcohol use: No  . Drug use: No  . Sexual activity: Never

## 2019-01-11 ENCOUNTER — Ambulatory Visit
Admission: RE | Admit: 2019-01-11 | Discharge: 2019-01-11 | Disposition: A | Discharge status: Home / Self Care | Payer: Medicare Other | Source: Ambulatory Visit | Attending: Orthopaedic Surgery | Admitting: Orthopaedic Surgery

## 2019-01-11 DIAGNOSIS — M79674 Pain in right toe(s): Secondary | ICD-10-CM

## 2019-01-11 DIAGNOSIS — M86171 Other acute osteomyelitis, right ankle and foot: Secondary | ICD-10-CM | POA: Diagnosis not present

## 2019-01-11 MED ORDER — GADOBENATE DIMEGLUMINE 529 MG/ML IV SOLN
15.0000 mL | Freq: Once | INTRAVENOUS | Status: AC | PRN
  Administered 2019-01-11: 15 mL via INTRAVENOUS

## 2019-01-12 NOTE — [place] Notes (Signed)
Needs f/u appt 

## 2019-01-16 ENCOUNTER — Ambulatory Visit (INDEPENDENT_AMBULATORY_CARE_PROVIDER_SITE_OTHER): Payer: Medicare Other | Admitting: Orthopaedic Surgery

## 2019-01-16 ENCOUNTER — Encounter (INDEPENDENT_AMBULATORY_CARE_PROVIDER_SITE_OTHER): Payer: Self-pay | Admitting: Orthopaedic Surgery

## 2019-01-16 DIAGNOSIS — M86171 Other acute osteomyelitis, right ankle and foot: Secondary | ICD-10-CM

## 2019-01-16 MED ORDER — SULFAMETHOXAZOLE-TRIMETHOPRIM 800-160 MG PO TABS: 1.0000 | ORAL_TABLET | Freq: Two times a day (BID) | ORAL | 0 refills | Status: DC

## 2019-01-16 NOTE — [place] Notes (Signed)
Office Visit Note   Patient: Charles [name]           Date of Birth: Feb 02, 1931           MRN: [id number] Visit Date: 01/16/2019              Requested by: Leanna Battles, MD [place], [place] [address] PCP: Leanna Battles, MD   Assessment & Plan: Visit Diagnoses:  1. Acute osteomyelitis of toe of right foot ([place])     Plan: MRI is consistent with osteomyelitis of the right great toe distal phalanx with soft tissue loss.  Unfortunately he will need a complete great toe amputation due to the soft tissue loss.  We discussed the findings today and answered all his questions to his satisfaction.  We will plan on surgery in the near future.  In the meantime he is to continue to take the Bactrim.  Follow-Up Instructions: Return for 1 week postop visit.   Orders:  No orders of the defined types were placed in this encounter.  Meds ordered this encounter  Medications  . sulfamethoxazole-trimethoprim (BACTRIM DS,SEPTRA DS) 800-160 MG tablet    Sig: Take 1 tablet by mouth 2 (two) times daily.    Dispense:  28 tablet    Refill:  0      Procedures: No procedures performed   Clinical Data: No additional findings.   Subjective: Chief Complaint  Patient presents with  . Right Foot - Follow-up, Pain    Charles [name] is a 85 year old gentleman who follows up today for MRI review of his right great toe wound.  He has been taking Bactrim since last week.  The cellulitis and redness has gotten significantly better.   Review of Systems   Objective: Vital Signs: There were no vitals taken for this visit.  Physical Exam  Ortho Exam The right great toe continues to have the large callus with an open wound.  Cellulitis has resolved. Specialty Comments:  No specialty comments available.  Imaging: No results found.   PMFS History: Patient Active Problem List   Diagnosis Date Noted  . Great toe pain, right 12/05/2018  . Sacral insufficiency fracture 07/04/2018   . Unsteady gait 07/04/2018  . Mild cognitive impairment 07/04/2018  . Neuropathy 07/04/2018  . CAD (coronary artery disease) 05/29/2018  . AVM (arteriovenous malformation) of colon   . Radiation proctitis   . Toe amputation status, right 03/01/2018  . Diverticulosis of colon with hemorrhage   . Acute blood loss anemia   . History of back surgery 10/03/2017  . Rectal bleeding 10/03/2017  . Hematochezia   . Lower GI bleeding 09/29/2017  . Treatment-emergent central sleep apnea 06/25/2017  . Chronic bilateral low back pain with bilateral sciatica 01/17/2017  . Post laminectomy syndrome 01/17/2017  . Chronic pain syndrome 01/17/2017  . Sacroiliitis ([place]) 01/17/2017  . Piriformis syndrome of right side 01/17/2017  . Malignant neoplasm of prostate ([place]) 07/19/2016  . Fixation hardware in spine 05/15/2016  . Hypoglycemic reaction 02/02/2015  . Tremor, essential 12/11/2014  . OSA on CPAP 04/24/2014  . 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 Hypersomnia with sleep apnea, unspecified 04/24/2014  . Depression 04/18/2013  . BPH (benign prostatic hyperplasia) 04/18/2013  . Impingement syndrome of right shoulder 04/14/2013    Class: Acute  . Scoliosis or kyphoscoliosis 04/08/2013    Class: Chronic  . Lumbosacral spondylosis without myelopathy 04/08/2013    Class: Chronic  . Acute respiratory failure ([place]) 04/08/2013  . Essential  hypertension 04/08/2013  . Lymphocytosis   . Diaphragmatic hernia 11/21/2010  . DIVERTICULAR DISEASE 11/21/2010  . BARRETT'S ESOPHAGUS 02/22/2010  . GERD 07/20/2008  . INGUINAL HERNIA, LEFT, SMALL 07/20/2008  . OTH VENTRAL HERN W/O MENTION OBSTRUCTION/GANGREN 07/20/2008  . CONSTIPATION 07/20/2008  . DYSPHAGIA 07/20/2008   Past Medical History:  Diagnosis Date  . Anemia   . Arthritis   . AVM (arteriovenous malformation)   . Barrett's esophagus   . BPH (benign prostatic hyperplasia)   . CAD (coronary artery disease)    only on stress test; never had  MI, PCI or CABG.  In 2008, LVEF was reportedly normal. pt. denies  . Cataract   . Constipation   . CPAP (continuous positive airway pressure) dependence   . Depression   . Diverticulosis    diverticular bleeding  . Diverticulosis   . GERD (gastroesophageal reflux disease)   . History of radiation therapy   . Hyperlipidemia   . Hypoglycemia   . Lymphocytosis 06/2012  . Mastodynia   . Murmur    slight  . Nasal congestion   . Osteoporosis   . Osteoporosis   . Prostate cancer ([place])   . Sleep apnea    wears CPAP  . Urinary retention with incomplete bladder emptying    pt uses bathroom and within 30 mins needs to go again  . Vitamin D deficiency     Family History  Problem Relation Age of Onset  . Prostate cancer Father   . CAD Mother   . Arthritis-Osteo Brother   . Colon cancer Neg Hx   . Stomach cancer Neg Hx     Past Surgical History:  Procedure Laterality Date  . AMPUTATION TOE Right 09/20/2017   Procedure: Right 4th toe amputation;  Surgeon: Wylene Simmer, MD;  Location: [facility];  Service: Orthopedics;  Laterality: Right;  foot block  . BACK SURGERY  04/2013  . back surgury     f  . BLEPHAROPLASTY Bilateral 02/23/15  . CATARACT EXTRACTION    . COLONOSCOPY    . COLONOSCOPY N/A 10/02/2017   Procedure: COLONOSCOPY;  Surgeon: Doran Stabler, MD;  Location: [facility];  Service: Gastroenterology;  Laterality: N/A;  . COLONOSCOPY WITH PROPOFOL N/A 10/01/2017   Procedure: COLONOSCOPY WITH PROPOFOL;  Surgeon: Doran Stabler, MD;  Location: [facility];  Service: Gastroenterology;  Laterality: N/A;  . COLONOSCOPY WITH PROPOFOL N/A 03/07/2018   Procedure: COLONOSCOPY WITH PROPOFOL;  Surgeon: [name], MD;  Location[facility];  Service: Endoscopy;  Laterality: N/A;  . ESOPHAGOGASTRODUODENOSCOPY (EGD) WITH PROPOFOL N/A 10/01/2017   Procedure: ESOPHAGOGASTRODUODENOSCOPY (EGD) WITH PROPOFOL;  Surgeon: Doran Stabler, MD;  [facility];  Service: Gastroenterology;  Laterality: N/A;  . EYE SURGERY Bilateral   . FINGER SURGERY Right    right thumb  . HARDWARE REMOVAL Right 05/15/2016   Procedure: Right Lumbar Two-Lumbar Five Removal of Hardware;  Surgeon: Kristeen Miss, MD;  Location: [facility];  Service: Neurosurgery;  Laterality: Right;  Right L2-5 removal of hardware  . INGUINAL HERNIA REPAIR     x4  . IR RADIOLOGIST EVAL & MGMT  07/10/2018  . IR RADIOLOGIST EVAL & MGMT  09/05/2018  . IR SACROPLASTY BILATERAL  07/15/2018  . REPAIR SPIGELIAN HERNIA  2011  . [place] ,2014  . TONSILLECTOMY    . UPPER GASTROINTESTINAL ENDOSCOPY    . VASECTOMY     Social History   Occupational History  .  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 Smoking status: Never Smoker  . Smokeless tobacco: Never Used  Substance and Sexual Activity  . Alcohol use: No  . Drug use: No  . Sexual activity: Never

## 2019-01-20 DIAGNOSIS — M79602 Pain in left arm: Secondary | ICD-10-CM | POA: Diagnosis not present

## 2019-01-20 DIAGNOSIS — W19XXXA Unspecified fall, initial encounter: Secondary | ICD-10-CM | POA: Diagnosis not present

## 2019-01-20 DIAGNOSIS — S51812A Laceration without foreign body of left forearm, initial encounter: Secondary | ICD-10-CM | POA: Diagnosis not present

## 2019-01-20 DIAGNOSIS — S41112A Laceration without foreign body of left upper arm, initial encounter: Secondary | ICD-10-CM | POA: Diagnosis not present

## 2019-01-21 ENCOUNTER — Encounter (INDEPENDENT_AMBULATORY_CARE_PROVIDER_SITE_OTHER): Payer: Self-pay

## 2019-01-21 ENCOUNTER — Ambulatory Visit (INDEPENDENT_AMBULATORY_CARE_PROVIDER_SITE_OTHER): Payer: Medicare Other | Admitting: Orthopaedic Surgery

## 2019-01-21 DIAGNOSIS — R338 Other retention of urine: Secondary | ICD-10-CM | POA: Diagnosis not present

## 2019-01-21 DIAGNOSIS — S51812D Laceration without foreign body of left forearm, subsequent encounter: Secondary | ICD-10-CM | POA: Diagnosis not present

## 2019-01-22 ENCOUNTER — Other Ambulatory Visit: Payer: Self-pay

## 2019-01-22 ENCOUNTER — Encounter (HOSPITAL_BASED_OUTPATIENT_CLINIC_OR_DEPARTMENT_OTHER): Payer: Self-pay | Admitting: *Deleted

## 2019-01-22 NOTE — [place] Notes (Signed)
Patient walk-in PAT. He has a large skin tear on his left forearm where he fell into some furniture 4days ago. He states he went into the [facility] walk-in clinic and they placed a tegaderm dressing and wrapped in coban. No outward drainage noted at this time.

## 2019-01-23 DIAGNOSIS — S51812D Laceration without foreign body of left forearm, subsequent encounter: Secondary | ICD-10-CM | POA: Diagnosis not present

## 2019-01-24 ENCOUNTER — Other Ambulatory Visit: Payer: Self-pay | Admitting: Neurology

## 2019-01-24 DIAGNOSIS — G2581 Restless legs syndrome: Secondary | ICD-10-CM

## 2019-01-27 ENCOUNTER — Telehealth (INDEPENDENT_AMBULATORY_CARE_PROVIDER_SITE_OTHER): Payer: Self-pay | Admitting: Orthopaedic Surgery

## 2019-01-27 DIAGNOSIS — S51812D Laceration without foreign body of left forearm, subsequent encounter: Secondary | ICD-10-CM | POA: Diagnosis not present

## 2019-01-27 NOTE — Telephone Encounter (Signed)
It's fine to reschedule for the following wednesday

## 2019-01-27 NOTE — Telephone Encounter (Signed)
Blanch Media called for Charles [name] about rescheduling his surgery for Wednesday.  Due to the fact his wife still isn't fully recovered from her surgery.

## 2019-01-27 NOTE — Telephone Encounter (Signed)
Other phone # [phone]

## 2019-01-27 NOTE — Telephone Encounter (Signed)
Wife has follow up tomorrow.  Decide then?

## 2019-01-28 NOTE — Telephone Encounter (Signed)
I called patient to discuss.  He is coming with his wife to her appointment today.  They will talk to Dr. Erlinda Hong about moving out even further and decide while here today.

## 2019-02-03 DIAGNOSIS — S51812D Laceration without foreign body of left forearm, subsequent encounter: Secondary | ICD-10-CM | POA: Diagnosis not present

## 2019-02-04 NOTE — [place] Notes (Signed)
Reviewed chart for surgery at [facility] tomorrow. Pt has multiple significant comorbidities, however I feel he is an acceptable candidate for outpatient surgery as this procedure can be performed under just local or block.  Lidia Collum, MD

## 2019-02-05 ENCOUNTER — Ambulatory Visit (HOSPITAL_BASED_OUTPATIENT_CLINIC_OR_DEPARTMENT_OTHER): Payer: Medicare Other | Admitting: Certified Registered"

## 2019-02-05 ENCOUNTER — Inpatient Hospital Stay (INDEPENDENT_AMBULATORY_CARE_PROVIDER_SITE_OTHER): Payer: Medicare Other | Admitting: Orthopaedic Surgery

## 2019-02-05 ENCOUNTER — Encounter (HOSPITAL_BASED_OUTPATIENT_CLINIC_OR_DEPARTMENT_OTHER)
Admission: RE | Disposition: A | Discharge status: Home / Self Care | Payer: Self-pay | Source: Home / Self Care | Attending: Orthopaedic Surgery

## 2019-02-05 ENCOUNTER — Ambulatory Visit (HOSPITAL_BASED_OUTPATIENT_CLINIC_OR_DEPARTMENT_OTHER)
Admission: RE | Admit: 2019-02-05 | Discharge: 2019-02-05 | Disposition: A | Discharge status: Home / Self Care | Payer: Medicare Other | Attending: Orthopaedic Surgery | Admitting: Orthopaedic Surgery

## 2019-02-05 ENCOUNTER — Other Ambulatory Visit: Payer: Self-pay

## 2019-02-05 ENCOUNTER — Encounter (HOSPITAL_BASED_OUTPATIENT_CLINIC_OR_DEPARTMENT_OTHER): Payer: Self-pay

## 2019-02-05 DIAGNOSIS — M810 Age-related osteoporosis without current pathological fracture: Secondary | ICD-10-CM | POA: Diagnosis not present

## 2019-02-05 DIAGNOSIS — K219 Gastro-esophageal reflux disease without esophagitis: Secondary | ICD-10-CM | POA: Diagnosis not present

## 2019-02-05 DIAGNOSIS — G4730 Sleep apnea, unspecified: Secondary | ICD-10-CM | POA: Insufficient documentation

## 2019-02-05 DIAGNOSIS — Z888 Allergy status to other drugs, medicaments and biological substances status: Secondary | ICD-10-CM | POA: Insufficient documentation

## 2019-02-05 DIAGNOSIS — E785 Hyperlipidemia, unspecified: Secondary | ICD-10-CM | POA: Diagnosis not present

## 2019-02-05 DIAGNOSIS — Z923 Personal history of irradiation: Secondary | ICD-10-CM | POA: Diagnosis not present

## 2019-02-05 DIAGNOSIS — M869 Osteomyelitis, unspecified: Secondary | ICD-10-CM | POA: Diagnosis not present

## 2019-02-05 DIAGNOSIS — E559 Vitamin D deficiency, unspecified: Secondary | ICD-10-CM | POA: Insufficient documentation

## 2019-02-05 DIAGNOSIS — D649 Anemia, unspecified: Secondary | ICD-10-CM | POA: Diagnosis not present

## 2019-02-05 DIAGNOSIS — N401 Enlarged prostate with lower urinary tract symptoms: Secondary | ICD-10-CM | POA: Insufficient documentation

## 2019-02-05 DIAGNOSIS — Z8546 Personal history of malignant neoplasm of prostate: Secondary | ICD-10-CM | POA: Diagnosis not present

## 2019-02-05 DIAGNOSIS — I2510 Atherosclerotic heart disease of native coronary artery without angina pectoris: Secondary | ICD-10-CM | POA: Diagnosis not present

## 2019-02-05 DIAGNOSIS — Z89421 Acquired absence of other right toe(s): Secondary | ICD-10-CM | POA: Diagnosis not present

## 2019-02-05 DIAGNOSIS — I10 Essential (primary) hypertension: Secondary | ICD-10-CM | POA: Insufficient documentation

## 2019-02-05 DIAGNOSIS — F329 Major depressive disorder, single episode, unspecified: Secondary | ICD-10-CM | POA: Diagnosis not present

## 2019-02-05 DIAGNOSIS — K5790 Diverticulosis of intestine, part unspecified, without perforation or abscess without bleeding: Secondary | ICD-10-CM | POA: Insufficient documentation

## 2019-02-05 DIAGNOSIS — R011 Cardiac murmur, unspecified: Secondary | ICD-10-CM | POA: Insufficient documentation

## 2019-02-05 DIAGNOSIS — K5900 Constipation, unspecified: Secondary | ICD-10-CM | POA: Insufficient documentation

## 2019-02-05 DIAGNOSIS — R338 Other retention of urine: Secondary | ICD-10-CM | POA: Diagnosis not present

## 2019-02-05 DIAGNOSIS — Z886 Allergy status to analgesic agent status: Secondary | ICD-10-CM | POA: Insufficient documentation

## 2019-02-05 DIAGNOSIS — K2270 Barrett's esophagus without dysplasia: Secondary | ICD-10-CM | POA: Diagnosis not present

## 2019-02-05 DIAGNOSIS — Z79899 Other long term (current) drug therapy: Secondary | ICD-10-CM | POA: Diagnosis not present

## 2019-02-05 DIAGNOSIS — M1990 Unspecified osteoarthritis, unspecified site: Secondary | ICD-10-CM | POA: Insufficient documentation

## 2019-02-05 DIAGNOSIS — Z9849 Cataract extraction status, unspecified eye: Secondary | ICD-10-CM | POA: Diagnosis not present

## 2019-02-05 DIAGNOSIS — Z981 Arthrodesis status: Secondary | ICD-10-CM | POA: Insufficient documentation

## 2019-02-05 DIAGNOSIS — M868X7 Other osteomyelitis, ankle and foot: Secondary | ICD-10-CM | POA: Diagnosis not present

## 2019-02-05 HISTORY — DX: Unspecified hearing loss, unspecified ear: H91.90

## 2019-02-05 HISTORY — PX: AMPUTATION TOE: SHX6595

## 2019-02-05 SURGERY — AMPUTATION, TOE
Anesthesia: Monitor Anesthesia Care | Site: Toe | Laterality: Right

## 2019-02-05 MED ORDER — BACITRACIN ZINC 500 UNIT/GM EX OINT
TOPICAL_OINTMENT | CUTANEOUS | Status: DC | PRN
  Administered 2019-02-05: 1 via TOPICAL

## 2019-02-05 MED ORDER — OXYCODONE HCL 5 MG PO TABS: 5.0000 mg | ORAL_TABLET | Freq: Once | ORAL | Status: DC | PRN

## 2019-02-05 MED ORDER — LACTATED RINGERS IV SOLN
INTRAVENOUS | Status: DC
  Administered 2019-02-05: 10:00:00 via INTRAVENOUS

## 2019-02-05 MED ORDER — CEFAZOLIN SODIUM-DEXTROSE 2-4 GM/100ML-% IV SOLN
INTRAVENOUS | Status: AC
  Filled 2019-02-05: qty 100

## 2019-02-05 MED ORDER — PROMETHAZINE HCL 25 MG/ML IJ SOLN: 6.2500 mg | INTRAMUSCULAR | Status: DC | PRN

## 2019-02-05 MED ORDER — OXYCODONE HCL 5 MG/5ML PO SOLN: 5.0000 mg | Freq: Once | ORAL | Status: DC | PRN

## 2019-02-05 MED ORDER — LIDOCAINE 2% (20 MG/ML) 5 ML SYRINGE
INTRAMUSCULAR | Status: AC
  Filled 2019-02-05: qty 5

## 2019-02-05 MED ORDER — SCOPOLAMINE 1 MG/3DAYS TD PT72: 1.0000 | MEDICATED_PATCH | Freq: Once | TRANSDERMAL | Status: DC | PRN

## 2019-02-05 MED ORDER — MIDAZOLAM HCL 2 MG/2ML IJ SOLN
INTRAMUSCULAR | Status: AC
  Filled 2019-02-05: qty 2

## 2019-02-05 MED ORDER — FENTANYL CITRATE (PF) 100 MCG/2ML IJ SOLN
INTRAMUSCULAR | Status: DC | PRN
  Administered 2019-02-05: 25 ug via INTRAVENOUS
  Administered 2019-02-05: 50 ug via INTRAVENOUS

## 2019-02-05 MED ORDER — CHLORHEXIDINE GLUCONATE 4 % EX LIQD: 60.0000 mL | Freq: Once | CUTANEOUS | Status: DC

## 2019-02-05 MED ORDER — FENTANYL CITRATE (PF) 100 MCG/2ML IJ SOLN: 50.0000 ug | INTRAMUSCULAR | Status: DC | PRN

## 2019-02-05 MED ORDER — HYDROCODONE-ACETAMINOPHEN 5-325 MG PO TABS: 1.0000 | ORAL_TABLET | Freq: Four times a day (QID) | ORAL | 0 refills | Status: DC | PRN

## 2019-02-05 MED ORDER — BUPIVACAINE HCL (PF) 0.25 % IJ SOLN
INTRAMUSCULAR | Status: DC | PRN
  Administered 2019-02-05: 6 mL

## 2019-02-05 MED ORDER — MIDAZOLAM HCL 2 MG/2ML IJ SOLN: 1.0000 mg | INTRAMUSCULAR | Status: DC | PRN

## 2019-02-05 MED ORDER — CEFAZOLIN SODIUM-DEXTROSE 2-4 GM/100ML-% IV SOLN
2.0000 g | INTRAVENOUS | Status: AC
  Administered 2019-02-05: 2 g via INTRAVENOUS

## 2019-02-05 MED ORDER — ONDANSETRON HCL 4 MG/2ML IJ SOLN
INTRAMUSCULAR | Status: AC
  Filled 2019-02-05: qty 2

## 2019-02-05 MED ORDER — ROPIVACAINE HCL 5 MG/ML IJ SOLN
INTRAMUSCULAR | Status: DC | PRN
  Administered 2019-02-05: 30 mL via PERINEURAL

## 2019-02-05 MED ORDER — MIDAZOLAM HCL 5 MG/5ML IJ SOLN
INTRAMUSCULAR | Status: DC | PRN
  Administered 2019-02-05: 1 mg via INTRAVENOUS

## 2019-02-05 MED ORDER — BACITRACIN ZINC 500 UNIT/GM EX OINT
TOPICAL_OINTMENT | CUTANEOUS | Status: AC
  Filled 2019-02-05: qty 28.35

## 2019-02-05 MED ORDER — FENTANYL CITRATE (PF) 100 MCG/2ML IJ SOLN
INTRAMUSCULAR | Status: AC
  Filled 2019-02-05: qty 2

## 2019-02-05 MED ORDER — ONDANSETRON HCL 4 MG/2ML IJ SOLN
INTRAMUSCULAR | Status: DC | PRN
  Administered 2019-02-05: 4 mg via INTRAVENOUS

## 2019-02-05 MED ORDER — HYDROMORPHONE HCL 1 MG/ML IJ SOLN: 0.2500 mg | INTRAMUSCULAR | Status: DC | PRN

## 2019-02-05 SURGICAL SUPPLY — 80 items
BANDAGE ESMARK 6X9 LF (GAUZE/BANDAGES/DRESSINGS) IMPLANT
BLADE AVERAGE 25MMX9MM (BLADE)
BLADE AVERAGE 25X9 (BLADE) IMPLANT
BLADE MINI RND TIP GREEN BEAV (BLADE) IMPLANT
BLADE OSC/SAG .038X5.5 CUT EDG (BLADE) IMPLANT
BLADE SURG 15 STRL LF DISP TIS (BLADE) ×2 IMPLANT
BLADE SURG 15 STRL SS (BLADE) ×6
BNDG CMPR 9X4 STRL LF SNTH (GAUZE/BANDAGES/DRESSINGS) ×1
BNDG CMPR 9X6 STRL LF SNTH (GAUZE/BANDAGES/DRESSINGS)
BNDG COHESIVE 1X5 TAN STRL LF (GAUZE/BANDAGES/DRESSINGS) ×1 IMPLANT
BNDG COHESIVE 2X5 TAN STRL LF (GAUZE/BANDAGES/DRESSINGS) IMPLANT
BNDG CONFORM 2 STRL LF (GAUZE/BANDAGES/DRESSINGS) ×2 IMPLANT
BNDG CONFORM 3 STRL LF (GAUZE/BANDAGES/DRESSINGS) IMPLANT
BNDG ELASTIC 2X5.8 VLCR STR LF (GAUZE/BANDAGES/DRESSINGS) ×2 IMPLANT
BNDG ESMARK 4X9 LF (GAUZE/BANDAGES/DRESSINGS) ×2 IMPLANT
BNDG ESMARK 6X9 LF (GAUZE/BANDAGES/DRESSINGS)
BNDG GAUZE 1X2.1 STRL (MISCELLANEOUS) ×1 IMPLANT
CORD BIPOLAR FORCEPS 12FT (ELECTRODE) ×3 IMPLANT
COVER BACK TABLE 60X90IN (DRAPES) ×3 IMPLANT
COVER WAND RF STERILE (DRAPES) IMPLANT
CUFF TOURNIQUET SINGLE 18IN (TOURNIQUET CUFF) IMPLANT
CUFF TOURNIQUET SINGLE 24IN (TOURNIQUET CUFF) IMPLANT
DRAPE EXTREMITY T 121X128X90 (DISPOSABLE) ×3 IMPLANT
DRAPE IMP U-DRAPE 54X76 (DRAPES) ×3 IMPLANT
DRAPE OEC MINIVIEW 54X84 (DRAPES) IMPLANT
DRAPE SURG 17X23 STRL (DRAPES) IMPLANT
DRAPE U-SHAPE 47X51 STRL (DRAPES) ×3 IMPLANT
DRSG EMULSION OIL 3X3 NADH (GAUZE/BANDAGES/DRESSINGS) ×3 IMPLANT
DURAPREP 26ML APPLICATOR (WOUND CARE) ×1 IMPLANT
ELECT REM PT RETURN 9FT ADLT (ELECTROSURGICAL) ×3
ELECTRODE REM PT RTRN 9FT ADLT (ELECTROSURGICAL) IMPLANT
GAUZE SPONGE 4X4 12PLY STRL (GAUZE/BANDAGES/DRESSINGS) ×2 IMPLANT
GLOVE BIO SURGEON STRL SZ7 (GLOVE) ×2 IMPLANT
GLOVE BIOGEL PI IND STRL 7.0 (GLOVE) ×1 IMPLANT
GLOVE BIOGEL PI INDICATOR 7.0 (GLOVE) ×2
GLOVE ECLIPSE 7.0 STRL STRAW (GLOVE) ×3 IMPLANT
GLOVE EXAM NITRILE MD LF STRL (GLOVE) ×2 IMPLANT
GLOVE ORTHO TXT STRL SZ7.5 (GLOVE) ×6 IMPLANT
GLOVE SKINSENSE NS SZ7.5 (GLOVE) ×2
GLOVE SKINSENSE STRL SZ7.5 (GLOVE) ×1 IMPLANT
GLOVE SURG SYN 7.5  E (GLOVE) ×2
GLOVE SURG SYN 7.5 E (GLOVE) ×1 IMPLANT
GLOVE SURG SYN 7.5 PF PI (GLOVE) ×1 IMPLANT
GOWN STRL REIN XL XLG (GOWN DISPOSABLE) ×3 IMPLANT
GOWN STRL REUS W/ TWL XL LVL3 (GOWN DISPOSABLE) ×1 IMPLANT
GOWN STRL REUS W/TWL XL LVL3 (GOWN DISPOSABLE) ×6
NDL HYPO 25X1 1.5 SAFETY (NEEDLE) IMPLANT
NEEDLE HYPO 22GX1.5 SAFETY (NEEDLE) IMPLANT
NEEDLE HYPO 25X1 1.5 SAFETY (NEEDLE) ×3 IMPLANT
NS IRRIG 1000ML POUR BTL (IV SOLUTION) ×5 IMPLANT
PACK BASIN DAY SURGERY FS (CUSTOM PROCEDURE TRAY) ×3 IMPLANT
PAD CAST 4YDX4 CTTN HI CHSV (CAST SUPPLIES) ×1 IMPLANT
PADDING CAST ABS 4INX4YD NS (CAST SUPPLIES)
PADDING CAST ABS COTTON 4X4 ST (CAST SUPPLIES) ×1 IMPLANT
PADDING CAST COTTON 4X4 STRL (CAST SUPPLIES)
PENCIL BUTTON HOLSTER BLD 10FT (ELECTRODE) ×2 IMPLANT
SHEET MEDIUM DRAPE 40X70 STRL (DRAPES) ×3 IMPLANT
SLEEVE SCD COMPRESS KNEE MED (MISCELLANEOUS) ×3 IMPLANT
SPONGE GAUZE 2X2 8PLY STER LF (GAUZE/BANDAGES/DRESSINGS)
SPONGE GAUZE 2X2 8PLY STRL LF (GAUZE/BANDAGES/DRESSINGS) IMPLANT
SPONGE LAP 18X18 RF (DISPOSABLE) ×4 IMPLANT
STOCKINETTE 6  STRL (DRAPES) ×2
STOCKINETTE 6 STRL (DRAPES) ×1 IMPLANT
SUCTION FRAZIER HANDLE 10FR (MISCELLANEOUS)
SUCTION TUBE FRAZIER 10FR DISP (MISCELLANEOUS) IMPLANT
SUT BONE WAX W31G (SUTURE) ×1 IMPLANT
SUT ETHILON 3 0 PS 1 (SUTURE) ×4 IMPLANT
SUT ETHILON 4 0 PS 2 18 (SUTURE) IMPLANT
SUT MNCRL AB 3-0 PS2 18 (SUTURE) IMPLANT
SUT MNCRL AB 4-0 PS2 18 (SUTURE) IMPLANT
SUT VIC AB 3-0 PS1 18 (SUTURE)
SUT VIC AB 3-0 PS1 18XBRD (SUTURE) IMPLANT
SYR BULB 3OZ (MISCELLANEOUS) ×2 IMPLANT
SYR CONTROL 10ML LL (SYRINGE) ×2 IMPLANT
TOWEL GREEN STERILE FF (TOWEL DISPOSABLE) ×3 IMPLANT
TRAY DSU PREP LF (CUSTOM PROCEDURE TRAY) ×2 IMPLANT
TUBE CONNECTING 20'X1/4 (TUBING) ×1
TUBE CONNECTING 20X1/4 (TUBING) ×2 IMPLANT
UNDERPAD 30X30 (UNDERPADS AND DIAPERS) ×3 IMPLANT
YANKAUER SUCT BULB TIP NO VENT (SUCTIONS) ×3 IMPLANT

## 2019-02-05 NOTE — Anesthesia Procedure Notes (Signed)
Anesthesia Regional Block: Popliteal block   Pre-Anesthetic Checklist: ,, timeout performed, Correct Patient, Correct Site, Correct Laterality, Correct Procedure, Correct Position, site marked, Risks and benefits discussed,  Surgical consent,  Pre-op evaluation,  At surgeon's request and post-op pain management  Laterality: Right  Prep: chloraprep       Needles:  Injection technique: Single-shot  Needle Type: Stimiplex     Needle Length: 9cm  Needle Gauge: 21     Additional Needles:   Procedures:,,,, ultrasound used (permanent image in chart),,,,  Narrative:  Start time: 02/05/2019 10:22 AM End time: 02/05/2019 10:27 AM Injection made incrementally with aspirations every 5 mL.  Performed by: Personally  Anesthesiologist: Lynda Rainwater, MD

## 2019-02-05 NOTE — Transfer of Care (Signed)
Immediate Anesthesia Transfer of Care Note  Patient: Charles [name]  Procedure(s) Performed: RIGHT GREAT TOE AMPUTATION (Right Toe)  Patient Location: PACU  Anesthesia Type:MAC  Level of Consciousness: awake and sedated  Airway & Oxygen Therapy: Patient Spontanous Breathing and Patient connected to face mask oxygen  Post-op Assessment: Report given to RN and Post -op Vital signs reviewed and stable  Post vital signs: Reviewed and stable  Last Vitals:  Vitals Value Taken Time  BP 124/58 02/05/2019 11:34 AM  Temp    Pulse 54 02/05/2019 11:36 AM  Resp 13 02/05/2019 11:36 AM  SpO2 95 % 02/05/2019 11:36 AM  Vitals shown include unvalidated device data.  Last Pain:  Vitals:   02/05/19 0946  TempSrc: Oral  PainSc: 0-No pain         Complications: No apparent anesthesia complications

## 2019-02-05 NOTE — [place] Notes (Signed)
Assisted Dr. Sabra Heck with right, ultrasound guided, popliteal block. Side rails up, monitors on throughout procedure. See vital signs in flow sheet. Tolerated Procedure well.

## 2019-02-05 NOTE — H&P (Signed)
PREOPERATIVE H&P  Chief Complaint: right great toe osteomyelitis  HPI: Charles [name] is a 85 y.o. male who presents for surgical treatment of right great toe osteomyelitis.  He denies any changes in medical history.  Past Medical History:  Diagnosis Date  . Anemia   . Arthritis   . AVM (arteriovenous malformation)   . Barrett's esophagus   . BPH (benign prostatic hyperplasia)   . Cataract   . Constipation   . CPAP (continuous positive airway pressure) dependence   . Depression   . Diverticulosis    diverticular bleeding  . Diverticulosis   . GERD (gastroesophageal reflux disease)   . History of radiation therapy   . HOH (hard of hearing)    wears hearing aids  . Hyperlipidemia   . Hypoglycemia   . Lymphocytosis 06/2012  . Mastodynia   . Murmur    slight  . Nasal congestion   . Osteoporosis   . Osteoporosis   . Prostate cancer ([place])   . Sleep apnea    wears CPAP  . Urinary retention with incomplete bladder emptying    pt uses bathroom and within 30 mins needs to go again  . Vitamin D deficiency    Past Surgical History:  Procedure Laterality Date  . AMPUTATION TOE Right 09/20/2017   Procedure: Right 4th toe amputation;  Surgeon: Wylene Simmer, MD;  Location: [facility];  Service: Orthopedics;  Laterality: Right;  foot block  . BACK SURGERY  04/2013  . back surgury     f  . BLEPHAROPLASTY Bilateral 02/23/15  . CATARACT EXTRACTION    . COLONOSCOPY    . COLONOSCOPY N/A 10/02/2017   Procedure: COLONOSCOPY;  Surgeon: Doran Stabler, MD;  Location: [facility];  Service: Gastroenterology;  Laterality: N/A;  . COLONOSCOPY WITH PROPOFOL N/A 10/01/2017   Procedure: COLONOSCOPY WITH PROPOFOL;  Surgeon: Doran Stabler, MD;  Location: [facility];  Service: Gastroenterology;  Laterality: N/A;  . COLONOSCOPY WITH PROPOFOL N/A 03/07/2018   Procedure: COLONOSCOPY WITH PROPOFOL;  Surgeon: [name], MD;  Location: [facility];  Service:  Endoscopy;  Laterality: N/A;  . ESOPHAGOGASTRODUODENOSCOPY (EGD) WITH PROPOFOL N/A 10/01/2017   Procedure: ESOPHAGOGASTRODUODENOSCOPY (EGD) WITH PROPOFOL;  Surgeon: Doran Stabler, MD;  Location: [facility];  Service: Gastroenterology;  Laterality: N/A;  . EYE SURGERY Bilateral   . FINGER SURGERY Right    right thumb  . HARDWARE REMOVAL Right 05/15/2016   Procedure: Right Lumbar Two-Lumbar Five Removal of Hardware;  Surgeon: Kristeen Miss, MD;  Location: [facility];  Service: Neurosurgery;  Laterality: Right;  Right L2-5 removal of hardware  . INGUINAL HERNIA REPAIR     x4  . IR RADIOLOGIST EVAL & MGMT  07/10/2018  . IR RADIOLOGIST EVAL & MGMT  09/05/2018  . IR SACROPLASTY BILATERAL  07/15/2018  . REPAIR SPIGELIAN HERNIA  2011  . [place] ,2014  . TONSILLECTOMY    . UPPER GASTROINTESTINAL ENDOSCOPY    . VASECTOMY     Social History   Socioeconomic History  . Marital status: Married    Spouse name: Charles [name]  . Number of children: 3  . Years of education: College  . Highest education level: Not on file  Occupational History  . 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 Financial resource strain: Not hard at all  . Food insecurity:    Worry: Never true  Inability: Never true  . Transportation needs:    Medical: No    Non-medical: No  Tobacco Use  . Smoking status: Never Smoker  . Smokeless tobacco: Never Used  Substance and Sexual Activity  . Alcohol use: No  . Drug use: No  . Sexual activity: Never  Lifestyle  . Physical activity:    Days per week: 7 days    Minutes per session: 10 min  . Stress: Not at all  Relationships  . Social connections:    Talks on phone: More than three times a week    Gets together: More than three times a week    Attends religious service: Never    Active member of club or organization: No    Attends meetings of clubs or organizations: Never    Relationship status: Married    Other Topics Concern  . Not on file  Social History Narrative   Patient is married Charles [name]).   Patient is a retired Arboriculturist.   Patient lives in [facility].   Patient has three adult children.   Patient does not drink any caffeine.   Patient is right-handed.   Family History  Problem Relation Age of Onset  . Prostate cancer Father   . CAD Mother   . Arthritis-Osteo Brother   . Colon cancer Neg Hx   . Stomach cancer Neg Hx    Allergies  Allergen Reactions  . Celebrex [Celecoxib] Other (See Comments)    Created stomach ulcers.  Marland Kitchen Flexeril [Cyclobenzaprine] Other (See Comments)    Caught in hiatal hernia and caused extreme pain   Prior to Admission medications   Medication Sig Start Date End Date Taking? Authorizing Provider  acetaminophen (TYLENOL) 500 MG tablet Take 500 mg by mouth every 6 (six) hours as needed.   Yes [provider]  buPROPion (WELLBUTRIN SR) 150 MG 12 hr tablet Take 150 mg by mouth 2 (two) times daily.  06/28/11  Yes [provider]  cholecalciferol (VITAMIN D) 1000 UNITS tablet Take 1,000 Units by mouth daily.   Yes [provider]  doxycycline (VIBRA-TABS) 100 MG tablet Take 1 tablet (100 mg total) by mouth 2 (two) times daily. 01/09/19  Yes Leandrew Koyanagi, MD  ferrous sulfate 325 (65 FE) MG EC tablet Take 325 mg by mouth daily with breakfast.   Yes [provider]  gabapentin (NEURONTIN) 300 MG capsule Take 1 tablet PO at 4 PM  and 1 tablet at 10 PM 06/20/16  Yes Ward Givens, NP  HYDROcodone-acetaminophen (NORCO/VICODIN) 5-325 MG tablet Take 1 tablet by mouth every 6 (six) hours as needed for moderate pain.   Yes [provider]  lovastatin (MEVACOR) 20 MG tablet Take 10 mg by mouth at bedtime.  07/22/12  Yes [provider]  Magnesium 250 MG TABS Take 250 mg by mouth daily.    Yes [provider]  methocarbamol (ROBAXIN) 500 MG tablet Take 500 mg by mouth every 6 (six) hours as  needed for muscle spasms.    Yes [provider]  Multiple Vitamins-Minerals (CENTRUM ADULTS PO) Take 1 tablet by mouth daily.   Yes [provider]  Multiple Vitamins-Minerals (PRESERVISION AREDS 2 PO) Take 1 capsule by mouth 2 (two) times daily.   Yes [provider]  mupirocin ointment (BACTROBAN) 2 % Apply to affected area 2 times daily 12/19/18 12/19/19 Yes Aundra Dubin, PA-C  omeprazole (PRILOSEC) 40 MG capsule Take 1 capsule (40 mg total) by mouth 2 (  two) times daily. 12/12/18  Yes [name], MD  propranolol (INDERAL) 10 MG tablet Take 10 mg by mouth daily.    Yes [provider]  rOPINIRole (REQUIP) 4 MG tablet Take 4 mg by mouth at bedtime.   Yes [provider]  sulfamethoxazole-trimethoprim (BACTRIM DS,SEPTRA DS) 800-160 MG tablet Take 1 tablet by mouth 2 (two) times daily. 01/09/19  Yes Leandrew Koyanagi, MD  Tamsulosin HCl (FLOMAX) 0.4 MG CAPS Take 0.4 mg by mouth 2 (two) times daily.  08/30/11  Yes [provider]  traMADol (ULTRAM) 50 MG tablet Take 100 mg by mouth 2 (two) times daily. Hold for sedation   Yes [provider]  carboxymethylcellul-glycerin (REFRESH REPAIR) 0.5-0.9 % ophthalmic solution Place 1 drop into both eyes 3 (three) times daily.    [provider]  rOPINIRole (REQUIP) 1 MG tablet TAKE 2 TABLETS(2 MG) BY MOUTH AT BEDTIME 01/27/19   Dohmeier, Asencion Partridge, MD     Positive ROS: All other systems have been reviewed and were otherwise negative with the exception of those mentioned in the HPI and as above.  Physical Exam: General: Alert, no acute distress Cardiovascular: No pedal edema Respiratory: No cyanosis, no use of accessory musculature GI: abdomen soft Skin: No lesions in the area of chief complaint Neurologic: Sensation intact distally Psychiatric: Patient is competent for consent with normal mood and affect Lymphatic: no lymphedema  MUSCULOSKELETAL: exam stable  Assessment: right  great toe osteomyelitis  Plan: Plan for Procedure(s): RIGHT GREAT TOE AMPUTATION  The risks benefits and alternatives were discussed with the patient including but not limited to the risks of nonoperative treatment, versus surgical intervention including infection, bleeding, nerve injury,  blood clots, cardiopulmonary complications, morbidity, mortality, among others, and they were willing to proceed.   Eduard Roux, MD   02/05/2019 7:55 AM

## 2019-02-05 NOTE — Anesthesia Postprocedure Evaluation (Signed)
Anesthesia Post Note  Patient: Charles [name]  Procedure(s) Performed: RIGHT GREAT TOE AMPUTATION (Right Toe)     Patient location during evaluation: PACU Anesthesia Type: MAC Level of consciousness: awake and alert Pain management: pain level controlled Vital Signs Assessment: post-procedure vital signs reviewed and stable Respiratory status: spontaneous breathing, nonlabored ventilation and respiratory function stable Cardiovascular status: stable and blood pressure returned to baseline Postop Assessment: no apparent nausea or vomiting Anesthetic complications: no    Last Vitals:  Vitals:   02/05/19 1234 02/05/19 1257  BP:  136/62  Pulse: (!) 57 (!) 56  Resp: 18 17  Temp:  36.5 C  SpO2: 96% 99%    Last Pain:  Vitals:   02/05/19 1257  TempSrc: Oral  PainSc: 0-No pain                 Lynda Rainwater

## 2019-02-05 NOTE — Anesthesia Preprocedure Evaluation (Signed)
Anesthesia Evaluation  Patient identified by MRN, date of birth, ID band Patient awake    Reviewed: Allergy & Precautions, NPO status , Patient's Chart, lab work & pertinent test results  Airway Mallampati: II  TM Distance: >3 FB Neck ROM: Full    Dental  (+) Teeth Intact, Dental Advisory Given   Pulmonary sleep apnea ,    breath sounds clear to auscultation       Cardiovascular hypertension, + CAD   Rhythm:Regular Rate:Normal     Neuro/Psych Depression negative neurological ROS  negative psych ROS   GI/Hepatic Neg liver ROS, GERD  ,  Endo/Other  negative endocrine ROS  Renal/GU negative Renal ROS  negative genitourinary   Musculoskeletal  (+) Arthritis , Osteoarthritis,    Abdominal   Peds negative pediatric ROS (+)  Hematology negative hematology ROS (+) anemia ,   Anesthesia Other Findings   Reproductive/Obstetrics negative OB ROS                             Anesthesia Physical  Anesthesia Plan  ASA: III  Anesthesia Plan: MAC   Post-op Pain Management:  Regional for Post-op pain   Induction: Intravenous  PONV Risk Score and Plan: 1 and Ondansetron  Airway Management Planned: Simple Face Mask  Additional Equipment:   Intra-op Plan:   Post-operative Plan:   Informed Consent: I have reviewed the patients History and Physical, chart, labs and discussed the procedure including the risks, benefits and alternatives for the proposed anesthesia with the patient or authorized representative who has indicated his/her understanding and acceptance.     Dental advisory given  Plan Discussed with: CRNA and Anesthesiologist  Anesthesia Plan Comments:         Anesthesia Quick Evaluation

## 2019-02-05 NOTE — Op Note (Signed)
   Date of Surgery: 02/05/2019  INDICATIONS: Mr. Hemp is a 85 y.o.-year-old male with a right great toe osteomyelitis;  The patient did consent to the procedure after discussion of the risks and benefits.  PREOPERATIVE DIAGNOSIS: Right great toe osteomyelitis  POSTOPERATIVE DIAGNOSIS: Same.  PROCEDURE:  1. Right great toe amputation through MTP joint 2. Adjacent tissue rearrangement right foot 3 cm  SURGEON: N. Eduard Roux, M.D.  ASSIST: Ciro Backer [place], [place]; necessary for the timely completion of procedure and due to complexity of procedure.  ANESTHESIA:  general  IV FLUIDS AND URINE: See anesthesia.  ESTIMATED BLOOD LOSS: minimal mL.  IMPLANTS: none  DRAINS: none  COMPLICATIONS: None.  DESCRIPTION OF PROCEDURE: The patient was brought to the operating room and placed supine on the operating table.  The patient had been signed prior to the procedure and this was documented. The patient had the anesthesia placed by the anesthesiologist.  A time-out was performed to confirm that this was the correct patient, site, side and location. The patient did receive antibiotics prior to the incision and was re-dosed during the procedure as needed at indicated intervals.  An esmarch was used as a calf tourniquet.  The patient had the operative extremity prepped and draped in the standard surgical fashion.    A modified fishmouth incision was created over the proximal phalanx in order to fully excise the pressure ulcer.  Sharp dissection was carried down onto the proximal phalanx.  Full-thickness flaps were then elevated off of the proximal phalanx and traced to the MTP joint which was then disarticulated sharply with a 15 blade.  After this was done the EHL and FHL tendons were pulled distally and then sharply transected and allowed to retract proximally.  The neurovascular bundles were then identified and also cauterized and retracted proximally buried in the soft tissue.  The surgical  wound was then thoroughly irrigated.  The tourniquet was deflated and hemostasis was obtained.  Adjacent tissue rearrangement was performed on the right foot in order to approximate the skin edges with 4-0 nylon sutures.  Sterile dressings were applied.  Patient tolerated the procedure well had no immediate complications.  POSTOPERATIVE PLAN: Patient will be heel weightbearing to the right foot in a postop shoe.  He will follow-up in 1 week for wound check.  Azucena Cecil, MD [place] 11:22 AM

## 2019-02-05 NOTE — Discharge Instructions (Signed)
Postoperative instructions:  Weightbearing instructions: heel weight bearing  Dressing instructions: Keep your dressing and/or splint clean and dry at all times.  It will be removed at your first post-operative appointment.  Your stitches and/or staples will be removed at this visit.  Incision instructions:  Do not soak your incision for 3 weeks after surgery.  If the incision gets wet, pat dry and do not scrub the incision.  Pain control:  You have been given a prescription to be taken as directed for post-operative pain control.  In addition, elevate the operative extremity above the heart at all times to prevent swelling and throbbing pain.  Take over-the-counter Colace, 100mg  by mouth twice a day while taking narcotic pain medications to help prevent constipation.  Follow up appointments: 1) 12-14 days for suture removal and wound check. 2) Dr. Erlinda Hong as scheduled.   -------------------------------------------------------------------------------------------------------------  After Surgery Pain Control:  After your surgery, post-surgical discomfort or pain is likely. This discomfort can last several days to a few weeks. At certain times of the day your discomfort may be more intense.  Did you receive a nerve block?  A nerve block can provide pain relief for one hour to two days after your surgery. As long as the nerve block is working, you will experience little or no sensation in the area the surgeon operated on.  As the nerve block wears off, you will begin to experience pain or discomfort. It is very important that you begin taking your prescribed pain medication before the nerve block fully wears off. Treating your pain at the first sign of the block wearing off will ensure your pain is better controlled and more tolerable when full-sensation returns. Do not wait until the pain is intolerable, as the medicine will be less effective. It is better to treat pain in advance than to try and  catch up.  General Anesthesia:  If you did not receive a nerve block during your surgery, you will need to start taking your pain medication shortly after your surgery and should continue to do so as prescribed by your surgeon.  Pain Medication:  Most commonly we prescribe Vicodin and Percocet for post-operative pain. Both of these medications contain a combination of acetaminophen (Tylenol) and a narcotic to help control pain.   It takes between 30 and 45 minutes before pain medication starts to work. It is important to take your medication before your pain level gets too intense.   Nausea is a common side effect of many pain medications. You will want to eat something before taking your pain medicine to help prevent nausea.   If you are taking a prescription pain medication that contains acetaminophen, we recommend that you do not take additional over the counter acetaminophen (Tylenol).  Other pain relieving options:   Using a cold pack to ice the affected area a few times a day (15 to 20 minutes at a time) can help to relieve pain, reduce swelling and bruising.   Elevation of the affected area can also help to reduce pain and swelling.         Post Anesthesia Home Care Instructions  Activity: Get plenty of rest for the remainder of the day. A responsible individual must stay with you for 24 hours following the procedure.  For the next 24 hours, DO NOT: -Drive a car -Paediatric nurse -Drink alcoholic beverages -Take any medication unless instructed by your physician -Make any legal decisions or sign important papers.  Meals: Start with liquid  foods such as gelatin or soup. Progress to regular foods as tolerated. Avoid greasy, spicy, heavy foods. If nausea and/or vomiting occur, drink only clear liquids until the nausea and/or vomiting subsides. Call your physician if vomiting continues.  Special Instructions/Symptoms: Your throat may feel dry or sore from the anesthesia  or the breathing tube placed in your throat during surgery. If this causes discomfort, gargle with warm salt water. The discomfort should disappear within 24 hours.  If you had a scopolamine patch placed behind your ear for the management of post- operative nausea and/or vomiting:  1. The medication in the patch is effective for 72 hours, after which it should be removed.  Wrap patch in a tissue and discard in the trash. Wash hands thoroughly with soap and water. 2. You may remove the patch earlier than 72 hours if you experience unpleasant side effects which may include dry mouth, dizziness or visual disturbances. 3. Avoid touching the patch. Wash your hands with soap and water after contact with the patch.       Regional Anesthesia Blocks  1. Numbness or the inability to move the "blocked" extremity may last from 3-48 hours after placement. The length of time depends on the medication injected and your individual response to the medication. If the numbness is not going away after 48 hours, call your surgeon.  2. The extremity that is blocked will need to be protected until the numbness is gone and the  Strength has returned. Because you cannot feel it, you will need to take extra care to avoid injury. Because it may be weak, you may have difficulty moving it or using it. You may not know what position it is in without looking at it while the block is in effect.  3. For blocks in the legs and feet, returning to weight bearing and walking needs to be done carefully. You will need to wait until the numbness is entirely gone and the strength has returned. You should be able to move your leg and foot normally before you try and bear weight or walk. You will need someone to be with you when you first try to ensure you do not fall and possibly risk injury.  4. Bruising and tenderness at the needle site are common side effects and will resolve in a few days.  5. Persistent numbness or new problems  with movement should be communicated to the surgeon or the [facility] [phone] [place] [phone]).

## 2019-02-06 ENCOUNTER — Other Ambulatory Visit (INDEPENDENT_AMBULATORY_CARE_PROVIDER_SITE_OTHER): Payer: Self-pay

## 2019-02-06 ENCOUNTER — Encounter (HOSPITAL_BASED_OUTPATIENT_CLINIC_OR_DEPARTMENT_OTHER): Payer: Self-pay | Admitting: Orthopaedic Surgery

## 2019-02-06 ENCOUNTER — Telehealth (INDEPENDENT_AMBULATORY_CARE_PROVIDER_SITE_OTHER): Payer: Self-pay | Admitting: Orthopaedic Surgery

## 2019-02-06 MED ORDER — SULFAMETHOXAZOLE-TRIMETHOPRIM 800-160 MG PO TABS: 1.0000 | ORAL_TABLET | Freq: Two times a day (BID) | ORAL | 0 refills | Status: DC

## 2019-02-06 NOTE — Telephone Encounter (Signed)
Patient's wife called requesting a refill on the patient's sulfamethoxazole-trimethoprim.  [facility] [address]  [phone].  Thank you.

## 2019-02-06 NOTE — Telephone Encounter (Signed)
Called into pharm. Called no answer LMOM.

## 2019-02-06 NOTE — Telephone Encounter (Signed)
Yes #20

## 2019-02-06 NOTE — Telephone Encounter (Signed)
See message below °

## 2019-02-06 NOTE — Telephone Encounter (Signed)
Called and spoke to wife janet and she is aware. They will wait for [facility] call and will pick up Rx.

## 2019-02-11 ENCOUNTER — Ambulatory Visit (INDEPENDENT_AMBULATORY_CARE_PROVIDER_SITE_OTHER): Payer: Medicare Other | Admitting: Physician Assistant

## 2019-02-11 ENCOUNTER — Encounter (INDEPENDENT_AMBULATORY_CARE_PROVIDER_SITE_OTHER): Payer: Self-pay | Admitting: Orthopaedic Surgery

## 2019-02-11 DIAGNOSIS — S98111A Complete traumatic amputation of right great toe, initial encounter: Secondary | ICD-10-CM

## 2019-02-11 NOTE — Progress Notes (Signed)
Post-Op Visit Note   Patient: Charles [name]           Date of Birth: 1931-06-25           MRN: [id number] Visit Date: 02/11/2019 PCP: Leanna Battles, MD   Assessment & Plan:  Chief Complaint:  Chief Complaint  Patient presents with  . Right Great Toe - Pain   Visit Diagnoses:  1. Amputation of right great toe [facility])     Plan: Patient is a pleasant 85 year old gentleman who presents our clinic today 6 days status post right great toe amputation, date of surgery 02/05/2019.  He has been doing well.  He has been compliant weightbearing as tolerated in a postoperative shoe.  No fevers or chills.  Minimal to no pain.  Examination of his right great toe wound reveals well-healing surgical incision with nylon sutures in place.  There is 1 small area of blood which occurred once the bandage was removed today.  No tenderness.  Very slight erythema.  No purulent drainage.  At this point, we will apply bactroban.  He will do this twice daily.  He will follow-up with [place] in 1 week for probable suture removal.  Continue to wear his postoperative shoe in the meantime.  Follow-Up Instructions: Return in about 1 week (around 02/18/2019).   Orders:  No orders of the defined types were placed in this encounter.  No orders of the defined types were placed in this encounter.   Imaging: No new imaging  PMFS History: Patient Active Problem List   Diagnosis Date Noted  . Osteomyelitis of great toe of right foot ([place]) 12/05/2018  . Sacral insufficiency fracture 07/04/2018  . Unsteady gait 07/04/2018  . Mild cognitive impairment 07/04/2018  . Neuropathy 07/04/2018  . CAD (coronary artery disease) 05/29/2018  . AVM (arteriovenous malformation) of colon   . Radiation proctitis   . Amputation of right great toe ([place]) 03/01/2018  . Diverticulosis of colon with hemorrhage   . Acute blood loss anemia   . History of back surgery 10/03/2017  . Rectal bleeding 10/03/2017  . Hematochezia   . Lower  GI bleeding 09/29/2017  . Treatment-emergent central sleep apnea 06/25/2017  . Chronic bilateral low back pain with bilateral sciatica 01/17/2017  . Post laminectomy syndrome 01/17/2017  . Chronic pain syndrome 01/17/2017  . Sacroiliitis ([place]) 01/17/2017  . Piriformis syndrome of right side 01/17/2017  . Malignant neoplasm of prostate ([place]) 07/19/2016  . Fixation hardware in spine 05/15/2016  . Hypoglycemic reaction 02/02/2015  . Tremor, essential 12/11/2014  . OSA on CPAP 04/24/2014  . 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 Hypersomnia with sleep apnea, unspecified 04/24/2014  . Depression 04/18/2013  . BPH (benign prostatic hyperplasia) 04/18/2013  . Impingement syndrome of right shoulder 04/14/2013    Class: Acute  . Scoliosis or kyphoscoliosis 04/08/2013    Class: Chronic  . Lumbosacral spondylosis without myelopathy 04/08/2013    Class: Chronic  . Acute respiratory failure ([place]) 04/08/2013  . Essential hypertension 04/08/2013  . Lymphocytosis   . Diaphragmatic hernia 11/21/2010  . DIVERTICULAR DISEASE 11/21/2010  . BARRETT'S ESOPHAGUS 02/22/2010  . GERD 07/20/2008  . INGUINAL HERNIA, LEFT, SMALL 07/20/2008  . OTH VENTRAL HERN W/O MENTION OBSTRUCTION/GANGREN 07/20/2008  . CONSTIPATION 07/20/2008  . DYSPHAGIA 07/20/2008   Past Medical History:  Diagnosis Date  . Anemia   . Arthritis   . AVM (arteriovenous malformation)   . Barrett's esophagus   . BPH (benign prostatic hyperplasia)   .  Cataract   . Constipation   . CPAP (continuous positive airway pressure) dependence   . Depression   . Diverticulosis    diverticular bleeding  . Diverticulosis   . GERD (gastroesophageal reflux disease)   . History of radiation therapy   . HOH (hard of hearing)    wears hearing aids  . Hyperlipidemia   . Hypoglycemia   . Lymphocytosis 06/2012  . Mastodynia   . Murmur    slight  . Nasal congestion   . Osteoporosis   . Osteoporosis   . Prostate cancer ([place])   . Sleep  apnea    wears CPAP  . Urinary retention with incomplete bladder emptying    pt uses bathroom and within 30 mins needs to go again  . Vitamin D deficiency     Family History  Problem Relation Age of Onset  . Prostate cancer Father   . CAD Mother   . Arthritis-Osteo Brother   . Colon cancer Neg Hx   . Stomach cancer Neg Hx     Past Surgical History:  Procedure Laterality Date  . AMPUTATION TOE Right 09/20/2017   Procedure: Right 4th toe amputation;  Surgeon: Wylene Simmer, MD;  Location: [place];  Service: Orthopedics;  Laterality: Right;  foot block  . AMPUTATION TOE Right 02/05/2019   Procedure: RIGHT GREAT TOE AMPUTATION;  Surgeon: Leandrew Koyanagi, MD;  Location: [place];  Service: Orthopedics;  Laterality: Right;  . BACK SURGERY  04/2013  . back surgury     f  . BLEPHAROPLASTY Bilateral 02/23/15  . CATARACT EXTRACTION    . COLONOSCOPY    . COLONOSCOPY N/A 10/02/2017   Procedure: COLONOSCOPY;  Surgeon: Doran Stabler, MD;  Location: [facility];  Service: Gastroenterology;  Laterality: N/A;  . COLONOSCOPY WITH PROPOFOL N/A 10/01/2017   Procedure: COLONOSCOPY WITH PROPOFOL;  Surgeon: Doran Stabler, MD;  [facility];  Service: Gastroenterology;  Laterality: N/A;  . COLONOSCOPY WITH PROPOFOL N/A 03/07/2018   Procedure: COLONOSCOPY WITH PROPOFOL;  Surgeon: [name], MD;  [facility];  Service: Endoscopy;  Laterality: N/A;  . ESOPHAGOGASTRODUODENOSCOPY (EGD) WITH PROPOFOL N/A 10/01/2017   Procedure: ESOPHAGOGASTRODUODENOSCOPY (EGD) WITH PROPOFOL;  Surgeon: Doran Stabler, MD;  [facility];  Service: Gastroenterology;  Laterality: N/A;  . EYE SURGERY Bilateral   . FINGER SURGERY Right    right thumb  . HARDWARE REMOVAL Right 05/15/2016   Procedure: Right Lumbar Two-Lumbar Five Removal of Hardware;  Surgeon: Kristeen Miss, MD;  Location: [facility];  Service: Neurosurgery;  Laterality: Right;  Right  L2-5 removal of hardware  . INGUINAL HERNIA REPAIR     x4  . IR RADIOLOGIST EVAL & MGMT  07/10/2018  . IR RADIOLOGIST EVAL & MGMT  09/05/2018  . IR SACROPLASTY BILATERAL  07/15/2018  . REPAIR SPIGELIAN HERNIA  2011  . [place] ,2014  . TONSILLECTOMY    . UPPER GASTROINTESTINAL ENDOSCOPY    . VASECTOMY     Social History   Occupational History  . 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 Smoking status: Never Smoker  . Smokeless tobacco: Never Used  Substance and Sexual Activity  . Alcohol use: No  . Drug use: No  . Sexual activity: Never

## 2019-02-18 ENCOUNTER — Ambulatory Visit (INDEPENDENT_AMBULATORY_CARE_PROVIDER_SITE_OTHER): Payer: Medicare Other | Admitting: Orthopaedic Surgery

## 2019-02-18 ENCOUNTER — Encounter (INDEPENDENT_AMBULATORY_CARE_PROVIDER_SITE_OTHER): Payer: Self-pay | Admitting: Orthopaedic Surgery

## 2019-02-18 ENCOUNTER — Other Ambulatory Visit: Payer: Self-pay

## 2019-02-18 DIAGNOSIS — M869 Osteomyelitis, unspecified: Secondary | ICD-10-CM

## 2019-02-18 DIAGNOSIS — Z89411 Acquired absence of right great toe: Secondary | ICD-10-CM

## 2019-02-18 DIAGNOSIS — Z4802 Encounter for removal of sutures: Secondary | ICD-10-CM

## 2019-02-18 NOTE — Progress Notes (Signed)
Patient ID: Charles [name], male   DOB: [birth year], 85 y.o.   MRN: [id number]   Charles [name] is two-week status post right great toe amputation.  He presents today for suture removal.  Surgical incision has healed nicely without any evidence of infection.  We remove the sutures and placed Steri-Strips.  He is to be careful about the postop shoe on the lateral side which I think may be causing the blister on the lateral aspect of the fifth metatarsal head.  Precautions and activity restrictions were reviewed today.  Recheck in 2 weeks.

## 2019-02-19 DIAGNOSIS — R338 Other retention of urine: Secondary | ICD-10-CM | POA: Diagnosis not present

## 2019-02-20 DIAGNOSIS — S51812D Laceration without foreign body of left forearm, subsequent encounter: Secondary | ICD-10-CM | POA: Diagnosis not present

## 2019-03-04 ENCOUNTER — Ambulatory Visit (INDEPENDENT_AMBULATORY_CARE_PROVIDER_SITE_OTHER): Payer: Medicare Other | Admitting: Orthopaedic Surgery

## 2019-03-05 DIAGNOSIS — M6281 Muscle weakness (generalized): Secondary | ICD-10-CM | POA: Diagnosis not present

## 2019-03-05 DIAGNOSIS — M545 Low back pain: Secondary | ICD-10-CM | POA: Diagnosis not present

## 2019-03-05 DIAGNOSIS — R2689 Other abnormalities of gait and mobility: Secondary | ICD-10-CM | POA: Diagnosis not present

## 2019-03-05 DIAGNOSIS — R2681 Unsteadiness on feet: Secondary | ICD-10-CM | POA: Diagnosis not present

## 2019-03-05 DIAGNOSIS — R293 Abnormal posture: Secondary | ICD-10-CM | POA: Diagnosis not present

## 2019-03-06 ENCOUNTER — Ambulatory Visit (INDEPENDENT_AMBULATORY_CARE_PROVIDER_SITE_OTHER): Payer: Medicare Other | Admitting: Specialist

## 2019-03-10 DIAGNOSIS — R2681 Unsteadiness on feet: Secondary | ICD-10-CM | POA: Diagnosis not present

## 2019-03-10 DIAGNOSIS — M545 Low back pain: Secondary | ICD-10-CM | POA: Diagnosis not present

## 2019-03-10 DIAGNOSIS — R2689 Other abnormalities of gait and mobility: Secondary | ICD-10-CM | POA: Diagnosis not present

## 2019-03-10 DIAGNOSIS — M6281 Muscle weakness (generalized): Secondary | ICD-10-CM | POA: Diagnosis not present

## 2019-03-10 DIAGNOSIS — R293 Abnormal posture: Secondary | ICD-10-CM | POA: Diagnosis not present

## 2019-03-14 DIAGNOSIS — M545 Low back pain: Secondary | ICD-10-CM | POA: Diagnosis not present

## 2019-03-14 DIAGNOSIS — R2689 Other abnormalities of gait and mobility: Secondary | ICD-10-CM | POA: Diagnosis not present

## 2019-03-14 DIAGNOSIS — M6281 Muscle weakness (generalized): Secondary | ICD-10-CM | POA: Diagnosis not present

## 2019-03-14 DIAGNOSIS — R293 Abnormal posture: Secondary | ICD-10-CM | POA: Diagnosis not present

## 2019-03-14 DIAGNOSIS — R2681 Unsteadiness on feet: Secondary | ICD-10-CM | POA: Diagnosis not present

## 2019-03-21 ENCOUNTER — Ambulatory Visit (INDEPENDENT_AMBULATORY_CARE_PROVIDER_SITE_OTHER): Payer: Medicare Other

## 2019-03-21 ENCOUNTER — Other Ambulatory Visit: Payer: Self-pay

## 2019-03-21 ENCOUNTER — Ambulatory Visit (INDEPENDENT_AMBULATORY_CARE_PROVIDER_SITE_OTHER): Payer: Medicare Other | Admitting: Orthopaedic Surgery

## 2019-03-21 ENCOUNTER — Encounter (INDEPENDENT_AMBULATORY_CARE_PROVIDER_SITE_OTHER): Payer: Self-pay | Admitting: Orthopaedic Surgery

## 2019-03-21 DIAGNOSIS — L97519 Non-pressure chronic ulcer of other part of right foot with unspecified severity: Secondary | ICD-10-CM

## 2019-03-21 MED ORDER — DOXYCYCLINE HYCLATE 100 MG PO TABS: 100.0000 mg | ORAL_TABLET | Freq: Two times a day (BID) | ORAL | 0 refills | Status: DC

## 2019-03-21 MED ORDER — SULFAMETHOXAZOLE-TRIMETHOPRIM 800-160 MG PO TABS: 1.0000 | ORAL_TABLET | Freq: Two times a day (BID) | ORAL | 0 refills | Status: DC

## 2019-03-21 NOTE — Progress Notes (Signed)
Office Visit Note   Patient: Charles [name]           Date of Birth: May 17, 1931           MRN: [id number] Visit Date: 03/21/2019              Requested by: Leanna Battles, MD [place], [place] [address] PCP: Leanna Battles, MD   Assessment & Plan: Visit Diagnoses:  1. Skin ulcer of second toe, right, with unspecified severity ([place])     Plan: Impression is right foot second toe blister concerning for osteomyelitis.  We will help offset this toe with a metatarsal pad and apply mupirocin to the blister.  We will start him on doxycycline and Bactrim DS.  Urgent MRI of the right second toe has been ordered.  Follow-up with [place] once that has been completed.  Call with concerns or questions in the meantime.  Follow-Up Instructions: Return in about 1 week (around 03/28/2019) for MRI review.   Orders:  Orders Placed This Encounter  Procedures  . XR Foot Complete Right  . MR TOES RIGHT W WO CONTRAST   Meds ordered this encounter  Medications  . DISCONTD: doxycycline (VIBRA-TABS) 100 MG tablet    Sig: Take 1 tablet (100 mg total) by mouth 2 (two) times daily.    Dispense:  28 tablet    Refill:  0  . DISCONTD: sulfamethoxazole-trimethoprim (BACTRIM DS) 800-160 MG tablet    Sig: Take 1 tablet by mouth 2 (two) times daily.    Dispense:  20 tablet    Refill:  0  . sulfamethoxazole-trimethoprim (BACTRIM DS) 800-160 MG tablet    Sig: Take 1 tablet by mouth 2 (two) times daily.    Dispense:  20 tablet    Refill:  0  . doxycycline (VIBRA-TABS) 100 MG tablet    Sig: Take 1 tablet (100 mg total) by mouth 2 (two) times daily.    Dispense:  28 tablet    Refill:  0      Procedures: No procedures performed   Clinical Data: No additional findings.   Subjective: Chief Complaint  Patient presents with  . Right Foot - Wound Check    HPI patient is a pleasant 85 year old gentleman who presents our clinic today with concerns about his right second toe.  History of  right great toe amputation a few months back.  Doing well there.  He did noticed a blister to the very distal aspect of the second toe yesterday.  No new injury or change in activity.  No fevers or chills.  He does note moderate pain and sensitivity to that toe.  Slight drainage.  He has been ambulating in a postoperative shoe with majority of his weight appearing to be on the second toe.  Review of Systems as detailed in HPI.  All others reviewed and are negative.   Objective: Vital Signs: There were no vitals taken for this visit.  Physical Exam well-developed and well-nourished gentleman in no acute distress.  Alert and oriented x3.  Ortho Exam examination of his right foot second toe reveals moderate swelling and tenderness.  Mild erythema.  He does have slight drainage to the blister.  Specialty Comments:  No specialty comments available.  Imaging: Xr Foot Complete Right  Result Date: 03/21/2019 No acute evidence of osteomyelitis    PMFS History: Patient Active Problem List   Diagnosis Date Noted  . Ulcer of second toe of right foot ([place]) 03/21/2019  . Osteomyelitis  of great toe of right foot ([place]) 12/05/2018  . Sacral insufficiency fracture 07/04/2018  . Unsteady gait 07/04/2018  . Mild cognitive impairment 07/04/2018  . Neuropathy 07/04/2018  . CAD (coronary artery disease) 05/29/2018  . AVM (arteriovenous malformation) of colon   . Radiation proctitis   . Amputation of right great toe ([place]) 03/01/2018  . Diverticulosis of colon with hemorrhage   . Acute blood loss anemia   . History of back surgery 10/03/2017  . Rectal bleeding 10/03/2017  . Hematochezia   . Lower GI bleeding 09/29/2017  . Treatment-emergent central sleep apnea 06/25/2017  . Chronic bilateral low back pain with bilateral sciatica 01/17/2017  . Post laminectomy syndrome 01/17/2017  . Chronic pain syndrome 01/17/2017  . Sacroiliitis ([place]) 01/17/2017  . Piriformis syndrome of right side 01/17/2017   . Malignant neoplasm of prostate ([place]) 07/19/2016  . Fixation hardware in spine 05/15/2016  . Hypoglycemic reaction 02/02/2015  . Tremor, essential 12/11/2014  . OSA on CPAP 04/24/2014  . 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 Hypersomnia with sleep apnea, unspecified 04/24/2014  . Depression 04/18/2013  . BPH (benign prostatic hyperplasia) 04/18/2013  . Impingement syndrome of right shoulder 04/14/2013    Class: Acute  . Scoliosis or kyphoscoliosis 04/08/2013    Class: Chronic  . Lumbosacral spondylosis without myelopathy 04/08/2013    Class: Chronic  . Acute respiratory failure ([place]) 04/08/2013  . Essential hypertension 04/08/2013  . Lymphocytosis   . Diaphragmatic hernia 11/21/2010  . DIVERTICULAR DISEASE 11/21/2010  . BARRETT'S ESOPHAGUS 02/22/2010  . GERD 07/20/2008  . INGUINAL HERNIA, LEFT, SMALL 07/20/2008  . OTH VENTRAL HERN W/O MENTION OBSTRUCTION/GANGREN 07/20/2008  . CONSTIPATION 07/20/2008  . DYSPHAGIA 07/20/2008   Past Medical History:  Diagnosis Date  . Anemia   . Arthritis   . AVM (arteriovenous malformation)   . Barrett's esophagus   . BPH (benign prostatic hyperplasia)   . Cataract   . Constipation   . CPAP (continuous positive airway pressure) dependence   . Depression   . Diverticulosis    diverticular bleeding  . Diverticulosis   . GERD (gastroesophageal reflux disease)   . History of radiation therapy   . HOH (hard of hearing)    wears hearing aids  . Hyperlipidemia   . Hypoglycemia   . Lymphocytosis 06/2012  . Mastodynia   . Murmur    slight  . Nasal congestion   . Osteoporosis   . Osteoporosis   . Prostate cancer ([place])   . Sleep apnea    wears CPAP  . Urinary retention with incomplete bladder emptying    pt uses bathroom and within 30 mins needs to go again  . Vitamin D deficiency     Family History  Problem Relation Age of Onset  . Prostate cancer Father   . CAD Mother   . Arthritis-Osteo Brother   . Colon cancer Neg Hx   .  Stomach cancer Neg Hx     Past Surgical History:  Procedure Laterality Date  . AMPUTATION TOE Right 09/20/2017   Procedure: Right 4th toe amputation;  Surgeon: Wylene Simmer, MD;  Location: [facility];  Service: Orthopedics;  Laterality: Right;  foot block  . AMPUTATION TOE Right 02/05/2019   Procedure: RIGHT GREAT TOE AMPUTATION;  Surgeon: Leandrew Koyanagi, MD;  Location: [facility];  Service: Orthopedics;  Laterality: Right;  . BACK SURGERY  04/2013  . back surgury     f  . BLEPHAROPLASTY Bilateral 02/23/15  .  CATARACT EXTRACTION    . COLONOSCOPY    . COLONOSCOPY N/A 10/02/2017   Procedure: COLONOSCOPY;  Surgeon: Doran Stabler, MD;  Location: [facility];  Service: Gastroenterology;  Laterality: N/A;  . COLONOSCOPY WITH PROPOFOL N/A 10/01/2017   Procedure: COLONOSCOPY WITH PROPOFOL;  Surgeon: Doran Stabler, MD;  [facility];  Service: Gastroenterology;  Laterality: N/A;  . COLONOSCOPY WITH PROPOFOL N/A 03/07/2018   Procedure: COLONOSCOPY WITH PROPOFOL;  Surgeon: [name], MD;  [facility];  Service: Endoscopy;  Laterality: N/A;  . ESOPHAGOGASTRODUODENOSCOPY (EGD) WITH PROPOFOL N/A 10/01/2017   Procedure: ESOPHAGOGASTRODUODENOSCOPY (EGD) WITH PROPOFOL;  Surgeon: Doran Stabler, MD;  [facility];  Service: Gastroenterology;  Laterality: N/A;  . EYE SURGERY Bilateral   . FINGER SURGERY Right    right thumb  . HARDWARE REMOVAL Right 05/15/2016   Procedure: Right Lumbar Two-Lumbar Five Removal of Hardware;  Surgeon: Kristeen Miss, MD;  Location: [facility];  Service: Neurosurgery;  Laterality: Right;  Right L2-5 removal of hardware  . INGUINAL HERNIA REPAIR     x4  . IR RADIOLOGIST EVAL & MGMT  07/10/2018  . IR RADIOLOGIST EVAL & MGMT  09/05/2018  . IR SACROPLASTY BILATERAL  07/15/2018  . REPAIR SPIGELIAN HERNIA  2011  . [place] ,2014  . TONSILLECTOMY    . UPPER GASTROINTESTINAL ENDOSCOPY    .  VASECTOMY     Social History   Occupational History  . 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 Smoking status: Never Smoker  . Smokeless tobacco: Never Used  Substance and Sexual Activity  . Alcohol use: No  . Drug use: No  . Sexual activity: Never

## 2019-03-25 DIAGNOSIS — N319 Neuromuscular dysfunction of bladder, unspecified: Secondary | ICD-10-CM | POA: Diagnosis not present

## 2019-03-25 DIAGNOSIS — Z8546 Personal history of malignant neoplasm of prostate: Secondary | ICD-10-CM | POA: Diagnosis not present

## 2019-03-25 DIAGNOSIS — Z89411 Acquired absence of right great toe: Secondary | ICD-10-CM | POA: Diagnosis not present

## 2019-03-25 DIAGNOSIS — G2581 Restless legs syndrome: Secondary | ICD-10-CM | POA: Diagnosis not present

## 2019-03-25 DIAGNOSIS — R634 Abnormal weight loss: Secondary | ICD-10-CM | POA: Diagnosis not present

## 2019-03-25 DIAGNOSIS — M545 Low back pain: Secondary | ICD-10-CM | POA: Diagnosis not present

## 2019-03-25 DIAGNOSIS — Z1331 Encounter for screening for depression: Secondary | ICD-10-CM | POA: Diagnosis not present

## 2019-03-25 DIAGNOSIS — G4733 Obstructive sleep apnea (adult) (pediatric): Secondary | ICD-10-CM | POA: Diagnosis not present

## 2019-03-25 DIAGNOSIS — I2510 Atherosclerotic heart disease of native coronary artery without angina pectoris: Secondary | ICD-10-CM | POA: Diagnosis not present

## 2019-03-25 DIAGNOSIS — R338 Other retention of urine: Secondary | ICD-10-CM | POA: Diagnosis not present

## 2019-03-28 ENCOUNTER — Ambulatory Visit (INDEPENDENT_AMBULATORY_CARE_PROVIDER_SITE_OTHER): Payer: Medicare Other | Admitting: Orthopaedic Surgery

## 2019-04-01 ENCOUNTER — Other Ambulatory Visit: Payer: Self-pay

## 2019-04-01 ENCOUNTER — Ambulatory Visit (INDEPENDENT_AMBULATORY_CARE_PROVIDER_SITE_OTHER): Payer: Medicare Other | Admitting: Orthopaedic Surgery

## 2019-04-01 ENCOUNTER — Ambulatory Visit
Admission: RE | Admit: 2019-04-01 | Discharge: 2019-04-01 | Disposition: A | Discharge status: Home / Self Care | Payer: Medicare Other | Source: Ambulatory Visit | Attending: Physician Assistant | Admitting: Physician Assistant

## 2019-04-01 DIAGNOSIS — R2689 Other abnormalities of gait and mobility: Secondary | ICD-10-CM | POA: Diagnosis not present

## 2019-04-01 DIAGNOSIS — L97519 Non-pressure chronic ulcer of other part of right foot with unspecified severity: Secondary | ICD-10-CM | POA: Diagnosis not present

## 2019-04-01 DIAGNOSIS — M6281 Muscle weakness (generalized): Secondary | ICD-10-CM | POA: Diagnosis not present

## 2019-04-01 DIAGNOSIS — R293 Abnormal posture: Secondary | ICD-10-CM | POA: Diagnosis not present

## 2019-04-01 DIAGNOSIS — M545 Low back pain: Secondary | ICD-10-CM | POA: Diagnosis not present

## 2019-04-01 DIAGNOSIS — R2681 Unsteadiness on feet: Secondary | ICD-10-CM | POA: Diagnosis not present

## 2019-04-01 MED ORDER — GADOBENATE DIMEGLUMINE 529 MG/ML IV SOLN
15.0000 mL | Freq: Once | INTRAVENOUS | Status: AC | PRN
  Administered 2019-04-01: 15 mL via INTRAVENOUS

## 2019-04-01 NOTE — Progress Notes (Signed)
Follow up appointment to discuss

## 2019-04-03 ENCOUNTER — Ambulatory Visit (INDEPENDENT_AMBULATORY_CARE_PROVIDER_SITE_OTHER): Payer: Medicare Other | Admitting: Orthopaedic Surgery

## 2019-04-03 ENCOUNTER — Other Ambulatory Visit: Payer: Self-pay

## 2019-04-03 ENCOUNTER — Encounter (INDEPENDENT_AMBULATORY_CARE_PROVIDER_SITE_OTHER): Payer: Self-pay | Admitting: Orthopaedic Surgery

## 2019-04-03 DIAGNOSIS — M869 Osteomyelitis, unspecified: Secondary | ICD-10-CM | POA: Insufficient documentation

## 2019-04-03 DIAGNOSIS — M2041 Other hammer toe(s) (acquired), right foot: Secondary | ICD-10-CM

## 2019-04-03 MED ORDER — SULFAMETHOXAZOLE-TRIMETHOPRIM 800-160 MG PO TABS: 1.0000 | ORAL_TABLET | Freq: Two times a day (BID) | ORAL | 0 refills | Status: DC

## 2019-04-03 NOTE — Progress Notes (Signed)
Office Visit Note   Patient: Charles [name]           Date of Birth: January 18, 1931           MRN: [id number] Visit Date: 04/03/2019              Requested by: Leanna Battles, MD [place], [place] [address] PCP: Leanna Battles, MD   Assessment & Plan: Visit Diagnoses:  1. Osteomyelitis of second toe of right foot ([place])   2. Hammertoe of second toe of right foot     Plan: MRI findings show a anterior ulceration with some enhancement of the distal phalanx that is concerning for osteomyelitis but given the chronicity and the fact that this is likely acute osteomyelitis I believe that there is a good chance that we could treat this nonsurgically with antibiotics and offloading of the second toe.  He will continue to use orthotic for the second toe.  I have made a referral for the wound center.  I would like to recheck him in 6 weeks.  He is agreeable to the plan and all questions answered to his satisfaction.  Follow-Up Instructions: Return in about 6 weeks (around 05/15/2019).   Orders:  No orders of the defined types were placed in this encounter.  No orders of the defined types were placed in this encounter.     Procedures: No procedures performed   Clinical Data: No additional findings.   Subjective: Chief Complaint  Patient presents with  . Right Foot - Follow-up    Ladislav returns today for MRI review of his right foot.   Review of Systems   Objective: Vital Signs: There were no vitals taken for this visit.  Physical Exam  Ortho Exam Right foot exam shows a hard callus with a rigid claw toe of the second toe.  There is no drainage. Specialty Comments:  No specialty comments available.  Imaging: No results found.   PMFS History: Patient Active Problem List   Diagnosis Date Noted  . Osteomyelitis of second toe of right foot ([place]) 04/03/2019  . Hammertoe of second toe of right foot 04/03/2019  . Ulcer of second toe of right foot ([place])  03/21/2019  . Osteomyelitis of great toe of right foot ([place]) 12/05/2018  . Sacral insufficiency fracture 07/04/2018  . Unsteady gait 07/04/2018  . Mild cognitive impairment 07/04/2018  . Neuropathy 07/04/2018  . CAD (coronary artery disease) 05/29/2018  . AVM (arteriovenous malformation) of colon   . Radiation proctitis   . Amputation of right great toe ([place]) 03/01/2018  . Diverticulosis of colon with hemorrhage   . Acute blood loss anemia   . History of back surgery 10/03/2017  . Rectal bleeding 10/03/2017  . Hematochezia   . Lower GI bleeding 09/29/2017  . Treatment-emergent central sleep apnea 06/25/2017  . Chronic bilateral low back pain with bilateral sciatica 01/17/2017  . Post laminectomy syndrome 01/17/2017  . Chronic pain syndrome 01/17/2017  . Sacroiliitis ([place]) 01/17/2017  . Piriformis syndrome of right side 01/17/2017  . Malignant neoplasm of prostate ([place]) 07/19/2016  . Fixation hardware in spine 05/15/2016  . Hypoglycemic reaction 02/02/2015  . Tremor, essential 12/11/2014  . OSA on CPAP 04/24/2014  . 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 Hypersomnia with sleep apnea, unspecified 04/24/2014  . Depression 04/18/2013  . BPH (benign prostatic hyperplasia) 04/18/2013  . Impingement syndrome of right shoulder 04/14/2013    Class: Acute  . Scoliosis or kyphoscoliosis 04/08/2013  Class: Chronic  . Lumbosacral spondylosis without myelopathy 04/08/2013    Class: Chronic  . Acute respiratory failure ([place]) 04/08/2013  . Essential hypertension 04/08/2013  . Lymphocytosis   . Diaphragmatic hernia 11/21/2010  . DIVERTICULAR DISEASE 11/21/2010  . BARRETT'S ESOPHAGUS 02/22/2010  . GERD 07/20/2008  . INGUINAL HERNIA, LEFT, SMALL 07/20/2008  . OTH VENTRAL HERN W/O MENTION OBSTRUCTION/GANGREN 07/20/2008  . CONSTIPATION 07/20/2008  . DYSPHAGIA 07/20/2008   Past Medical History:  Diagnosis Date  . Anemia   . Arthritis   . AVM (arteriovenous malformation)   .  Barrett's esophagus   . BPH (benign prostatic hyperplasia)   . Cataract   . Constipation   . CPAP (continuous positive airway pressure) dependence   . Depression   . Diverticulosis    diverticular bleeding  . Diverticulosis   . GERD (gastroesophageal reflux disease)   . History of radiation therapy   . HOH (hard of hearing)    wears hearing aids  . Hyperlipidemia   . Hypoglycemia   . Lymphocytosis 06/2012  . Mastodynia   . Murmur    slight  . Nasal congestion   . Osteoporosis   . Osteoporosis   . Prostate cancer ([place])   . Sleep apnea    wears CPAP  . Urinary retention with incomplete bladder emptying    pt uses bathroom and within 30 mins needs to go again  . Vitamin D deficiency     Family History  Problem Relation Age of Onset  . Prostate cancer Father   . CAD Mother   . Arthritis-Osteo Brother   . Colon cancer Neg Hx   . Stomach cancer Neg Hx     Past Surgical History:  Procedure Laterality Date  . AMPUTATION TOE Right 09/20/2017   Procedure: Right 4th toe amputation;  Surgeon: Wylene Simmer, MD;  Location: [facility];  Service: Orthopedics;  Laterality: Right;  foot block  . AMPUTATION TOE Right 02/05/2019   Procedure: RIGHT GREAT TOE AMPUTATION;  Surgeon: Leandrew Koyanagi, MD;  Location: [facility];  Service: Orthopedics;  Laterality: Right;  . BACK SURGERY  04/2013  . back surgury     f  . BLEPHAROPLASTY Bilateral 02/23/15  . CATARACT EXTRACTION    . COLONOSCOPY    . COLONOSCOPY N/A 10/02/2017   Procedure: COLONOSCOPY;  Surgeon: Doran Stabler, MD;  Location[facility];  Service: Gastroenterology;  Laterality: N/A;  . COLONOSCOPY WITH PROPOFOL N/A 10/01/2017   Procedure: COLONOSCOPY WITH PROPOFOL;  Surgeon: Doran Stabler, MD;  [facility];  Service: Gastroenterology;  Laterality: N/A;  . COLONOSCOPY WITH PROPOFOL N/A 03/07/2018   Procedure: COLONOSCOPY WITH PROPOFOL;  Surgeon: [name], MD;  [facility];  Service: Endoscopy;  Laterality: N/A;  . ESOPHAGOGASTRODUODENOSCOPY (EGD) WITH PROPOFOL N/A 10/01/2017   Procedure: ESOPHAGOGASTRODUODENOSCOPY (EGD) WITH PROPOFOL;  Surgeon: Doran Stabler, MD;  [facility];  Service: Gastroenterology;  Laterality: N/A;  . EYE SURGERY Bilateral   . FINGER SURGERY Right    right thumb  . HARDWARE REMOVAL Right 05/15/2016   Procedure: Right Lumbar Two-Lumbar Five Removal of Hardware;  Surgeon: Kristeen Miss, MD;  Location: [facility];  Service: Neurosurgery;  Laterality: Right;  Right L2-5 removal of hardware  . INGUINAL HERNIA REPAIR     x4  . IR RADIOLOGIST EVAL & MGMT  07/10/2018  . IR RADIOLOGIST EVAL & MGMT  09/05/2018  . IR SACROPLASTY BILATERAL  07/15/2018  .  REPAIR SPIGELIAN HERNIA  2011  . [place] ,2014  . TONSILLECTOMY    . UPPER GASTROINTESTINAL ENDOSCOPY    . VASECTOMY     Social History   Occupational History  . 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 Smoking status: Never Smoker  . Smokeless tobacco: Never Used  Substance and Sexual Activity  . Alcohol use: No  . Drug use: No  . Sexual activity: Never

## 2019-04-04 DIAGNOSIS — M545 Low back pain: Secondary | ICD-10-CM | POA: Diagnosis not present

## 2019-04-04 DIAGNOSIS — R293 Abnormal posture: Secondary | ICD-10-CM | POA: Diagnosis not present

## 2019-04-04 DIAGNOSIS — R2681 Unsteadiness on feet: Secondary | ICD-10-CM | POA: Diagnosis not present

## 2019-04-04 DIAGNOSIS — R2689 Other abnormalities of gait and mobility: Secondary | ICD-10-CM | POA: Diagnosis not present

## 2019-04-04 DIAGNOSIS — M6281 Muscle weakness (generalized): Secondary | ICD-10-CM | POA: Diagnosis not present

## 2019-04-07 DIAGNOSIS — M545 Low back pain: Secondary | ICD-10-CM | POA: Diagnosis not present

## 2019-04-07 DIAGNOSIS — M6281 Muscle weakness (generalized): Secondary | ICD-10-CM | POA: Diagnosis not present

## 2019-04-07 DIAGNOSIS — R2681 Unsteadiness on feet: Secondary | ICD-10-CM | POA: Diagnosis not present

## 2019-04-07 DIAGNOSIS — R2689 Other abnormalities of gait and mobility: Secondary | ICD-10-CM | POA: Diagnosis not present

## 2019-04-07 DIAGNOSIS — R293 Abnormal posture: Secondary | ICD-10-CM | POA: Diagnosis not present

## 2019-04-09 ENCOUNTER — Other Ambulatory Visit: Payer: Self-pay

## 2019-04-09 ENCOUNTER — Encounter (HOSPITAL_BASED_OUTPATIENT_CLINIC_OR_DEPARTMENT_OTHER): Payer: Medicare Other | Attending: Physician Assistant

## 2019-04-09 DIAGNOSIS — G9009 Other idiopathic peripheral autonomic neuropathy: Secondary | ICD-10-CM | POA: Diagnosis not present

## 2019-04-09 DIAGNOSIS — L97512 Non-pressure chronic ulcer of other part of right foot with fat layer exposed: Secondary | ICD-10-CM | POA: Diagnosis not present

## 2019-04-09 DIAGNOSIS — I2510 Atherosclerotic heart disease of native coronary artery without angina pectoris: Secondary | ICD-10-CM | POA: Insufficient documentation

## 2019-04-09 DIAGNOSIS — G4730 Sleep apnea, unspecified: Secondary | ICD-10-CM | POA: Insufficient documentation

## 2019-04-09 DIAGNOSIS — I10 Essential (primary) hypertension: Secondary | ICD-10-CM | POA: Diagnosis not present

## 2019-04-09 DIAGNOSIS — I482 Chronic atrial fibrillation, unspecified: Secondary | ICD-10-CM | POA: Diagnosis not present

## 2019-04-10 DIAGNOSIS — R2681 Unsteadiness on feet: Secondary | ICD-10-CM | POA: Diagnosis not present

## 2019-04-10 DIAGNOSIS — M545 Low back pain: Secondary | ICD-10-CM | POA: Diagnosis not present

## 2019-04-10 DIAGNOSIS — R2689 Other abnormalities of gait and mobility: Secondary | ICD-10-CM | POA: Diagnosis not present

## 2019-04-10 DIAGNOSIS — R293 Abnormal posture: Secondary | ICD-10-CM | POA: Diagnosis not present

## 2019-04-10 DIAGNOSIS — M6281 Muscle weakness (generalized): Secondary | ICD-10-CM | POA: Diagnosis not present

## 2019-04-11 NOTE — Progress Notes (Signed)
I think xu has seen him already

## 2019-04-14 ENCOUNTER — Ambulatory Visit (INDEPENDENT_AMBULATORY_CARE_PROVIDER_SITE_OTHER): Payer: Medicare Other | Admitting: Specialist

## 2019-04-15 DIAGNOSIS — R293 Abnormal posture: Secondary | ICD-10-CM | POA: Diagnosis not present

## 2019-04-15 DIAGNOSIS — M6281 Muscle weakness (generalized): Secondary | ICD-10-CM | POA: Diagnosis not present

## 2019-04-15 DIAGNOSIS — M545 Low back pain: Secondary | ICD-10-CM | POA: Diagnosis not present

## 2019-04-15 DIAGNOSIS — R2681 Unsteadiness on feet: Secondary | ICD-10-CM | POA: Diagnosis not present

## 2019-04-15 DIAGNOSIS — R2689 Other abnormalities of gait and mobility: Secondary | ICD-10-CM | POA: Diagnosis not present

## 2019-04-16 DIAGNOSIS — G9009 Other idiopathic peripheral autonomic neuropathy: Secondary | ICD-10-CM | POA: Diagnosis not present

## 2019-04-16 DIAGNOSIS — I482 Chronic atrial fibrillation, unspecified: Secondary | ICD-10-CM | POA: Diagnosis not present

## 2019-04-16 DIAGNOSIS — I2510 Atherosclerotic heart disease of native coronary artery without angina pectoris: Secondary | ICD-10-CM | POA: Diagnosis not present

## 2019-04-16 DIAGNOSIS — M86671 Other chronic osteomyelitis, right ankle and foot: Secondary | ICD-10-CM | POA: Diagnosis not present

## 2019-04-16 DIAGNOSIS — G4730 Sleep apnea, unspecified: Secondary | ICD-10-CM | POA: Diagnosis not present

## 2019-04-16 DIAGNOSIS — I10 Essential (primary) hypertension: Secondary | ICD-10-CM | POA: Diagnosis not present

## 2019-04-16 DIAGNOSIS — L97512 Non-pressure chronic ulcer of other part of right foot with fat layer exposed: Secondary | ICD-10-CM | POA: Diagnosis not present

## 2019-04-21 ENCOUNTER — Encounter: Payer: Self-pay | Admitting: Specialist

## 2019-04-21 ENCOUNTER — Ambulatory Visit: Payer: Self-pay

## 2019-04-21 ENCOUNTER — Other Ambulatory Visit: Payer: Self-pay

## 2019-04-21 ENCOUNTER — Ambulatory Visit (INDEPENDENT_AMBULATORY_CARE_PROVIDER_SITE_OTHER): Payer: Medicare Other | Admitting: Specialist

## 2019-04-21 VITALS — BP 116/59 | HR 60 | Ht 66.0 in | Wt 169.0 lb

## 2019-04-21 DIAGNOSIS — M545 Low back pain, unspecified: Secondary | ICD-10-CM

## 2019-04-21 DIAGNOSIS — M8000XA Age-related osteoporosis with current pathological fracture, unspecified site, initial encounter for fracture: Secondary | ICD-10-CM

## 2019-04-21 DIAGNOSIS — M4125 Other idiopathic scoliosis, thoracolumbar region: Secondary | ICD-10-CM | POA: Diagnosis not present

## 2019-04-21 DIAGNOSIS — R338 Other retention of urine: Secondary | ICD-10-CM | POA: Diagnosis not present

## 2019-04-21 DIAGNOSIS — M4030 Flatback syndrome, site unspecified: Secondary | ICD-10-CM

## 2019-04-21 NOTE — Patient Instructions (Signed)
Use the cervical traction 15 minutes per day. Use the middle inclination and go to higher inclination if not able to extend the head back onto the device comfortablely. Obtain long xrays that show your whole spine so the degree of spine alignment can be assessed. Bone density to determine if the current osteoporosis treatment is working. Physical therapy directed at core muscle strengthening and postural exercises also work on  Hip extension exercises and stretching.

## 2019-04-21 NOTE — Progress Notes (Signed)
Office Visit Note   Patient: Charles [name]           Date of Birth: 07/15/1931           MRN: [id number] Visit Date: 04/21/2019              Requested by: Leanna Battles, MD [place], [place] [address] PCP: Leanna Battles, MD   Assessment & Plan: Visit Diagnoses:  1. Low back pain, unspecified back pain laterality, unspecified chronicity, unspecified whether sciatica present   2. Other idiopathic scoliosis, thoracolumbar region   3. Acquired flat back syndrome   4. Osteoporosis with current pathological fracture, unspecified osteoporosis type, initial encounter     Plan: Use the cervical traction 15 minutes per day. Use the middle inclination and go to higher inclination if not able to extend the head back onto the device comfortablely. Obtain long xrays that show your whole spine so the degree of spine alignment can be assessed. Bone density to determine if the current osteoporosis treatment is working. Physical therapy directed at core muscle strengthening and postural exercises also work on  Hip extension exercises and stretching.  Follow-Up Instructions: Return in about 4 weeks (around 05/19/2019).   Orders:  Orders Placed This Encounter  Procedures  . XR Lumbar Spine 2-3 Views  . XR SCOLIOSIS EVAL COMPLETE SPINE 2 OR 3 VIEWS  . Ambulatory referral to Physical Therapy   No orders of the defined types were placed in this encounter.     Procedures: No procedures performed   Clinical Data: No additional findings.   Subjective: Chief Complaint  Patient presents with  . Lower Back - Follow-up, Pain    85 year old male with history of lumbar fusion initially at L5-S1 for spondylolisthesis then developed adjacent spinal stenosis and spondylosis changes with a lumbar scoliosis  Pattern. He is not as painful today and not really experiencing discomfort as much as he is concern about the right sided leaning with sitting and with standing he is  stooped and leaning to the right side. He has more recently had a right great toe amputation and has clawing of the right second toe with a terminal callus and ulcer and MRI with borderline  Osteomyelitis of the right 2nd toe distal phalanx. He wants to know what can be done to improve his spine or prevent further deformity.    Review of Systems  Constitutional: Positive for activity change. Negative for appetite change, chills and diaphoresis.  HENT: Negative.   Eyes: Negative.   Respiratory: Negative.   Cardiovascular: Negative.   Gastrointestinal: Negative.  Negative for abdominal distention, abdominal pain, anal bleeding, blood in stool, constipation, diarrhea, nausea, rectal pain and vomiting.  Endocrine: Negative for cold intolerance, heat intolerance, polydipsia, polyphagia and polyuria.  Genitourinary: Negative.  Negative for decreased urine volume, difficulty urinating, enuresis, flank pain, frequency, genital sores, hematuria and urgency.  Musculoskeletal: Positive for arthralgias and back pain. Negative for gait problem, joint swelling, myalgias, neck pain and neck stiffness.  Skin: Negative for color change, pallor, rash and wound.  Allergic/Immunologic: Negative for environmental allergies, food allergies and immunocompromised state.  Neurological: Positive for weakness and numbness. Negative for dizziness, tremors, seizures, syncope, facial asymmetry, speech difficulty, light-headedness and headaches.  Hematological: Negative for adenopathy. Does not bruise/bleed easily.  Psychiatric/Behavioral: Negative for agitation, behavioral problems, confusion, decreased concentration, dysphoric mood, hallucinations, self-injury, sleep disturbance and suicidal ideas. The patient is not nervous/anxious and is not hyperactive.  Objective: Vital Signs: BP (!) 116/59 (BP Location: Left Arm, Patient Position: Sitting)   Pulse 60   Ht 5\' 6"  (1.676 m)   Wt 169 lb (76.7 kg)   BMI 27.28  kg/m   Physical Exam  Back Exam   Tenderness  The patient is experiencing tenderness in the lumbar.  Range of Motion  Extension:  70 abnormal  Flexion: normal  Lateral bend left: normal  Rotation left: normal   Muscle Strength  Right Quadriceps:  5/5  Left Quadriceps:  5/5  Right Hamstrings:  5/5  Left Hamstrings:  5/5   Tests  Straight leg raise right: negative Straight leg raise left: negative  Reflexes  Patellar: normal Achilles: normal Biceps: normal Babinski's sign: normal   Other  Toe walk: normal Heel walk: normal Sensation: normal Gait: normal  Erythema: no back redness Scars: absent  Comments:  Right great toe amputation at the MTP joint healed 2nd toe with distal terminal callus trimmed and there is a small open ulcer at the tip without purulence.       Specialty Comments:  No specialty comments available.  Imaging: No results found.   PMFS History: Patient Active Problem List   Diagnosis Date Noted  . Scoliosis or kyphoscoliosis 04/08/2013    Priority: High    Class: Chronic  . Lumbosacral spondylosis without myelopathy 04/08/2013    Priority: High    Class: Chronic  . Impingement syndrome of right shoulder 04/14/2013    Priority: Medium    Class: Acute  . Osteomyelitis of second toe of right foot ([place]) 04/03/2019  . Hammertoe of second toe of right foot 04/03/2019  . Ulcer of second toe of right foot ([place]) 03/21/2019  . Osteomyelitis of great toe of right foot ([place]) 12/05/2018  . Sacral insufficiency fracture 07/04/2018  . Unsteady gait 07/04/2018  . Mild cognitive impairment 07/04/2018  . Neuropathy 07/04/2018  . CAD (coronary artery disease) 05/29/2018  . AVM (arteriovenous malformation) of colon   . Radiation proctitis   . Amputation of right great toe ([place]) 03/01/2018  . Diverticulosis of colon with hemorrhage   . Acute blood loss anemia   . History of back surgery 10/03/2017  . Rectal bleeding 10/03/2017  . Hematochezia    . Lower GI bleeding 09/29/2017  . Treatment-emergent central sleep apnea 06/25/2017  . Chronic bilateral low back pain with bilateral sciatica 01/17/2017  . Post laminectomy syndrome 01/17/2017  . Chronic pain syndrome 01/17/2017  . Sacroiliitis ([place]) 01/17/2017  . Piriformis syndrome of right side 01/17/2017  . Malignant neoplasm of prostate ([place]) 07/19/2016  . Fixation hardware in spine 05/15/2016  . Hypoglycemic reaction 02/02/2015  . Tremor, essential 12/11/2014  . OSA on CPAP 04/24/2014  . 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 Hypersomnia with sleep apnea, unspecified 04/24/2014  . Depression 04/18/2013  . BPH (benign prostatic hyperplasia) 04/18/2013  . Acute respiratory failure ([place]) 04/08/2013  . Essential hypertension 04/08/2013  . Lymphocytosis   . Diaphragmatic hernia 11/21/2010  . DIVERTICULAR DISEASE 11/21/2010  . BARRETT'S ESOPHAGUS 02/22/2010  . GERD 07/20/2008  . INGUINAL HERNIA, LEFT, SMALL 07/20/2008  . OTH VENTRAL HERN W/O MENTION OBSTRUCTION/GANGREN 07/20/2008  . CONSTIPATION 07/20/2008  . DYSPHAGIA 07/20/2008   Past Medical History:  Diagnosis Date  . Anemia   . Arthritis   . AVM (arteriovenous malformation)   . Barrett's esophagus   . BPH (benign prostatic hyperplasia)   . Cataract   . Constipation   . CPAP (continuous positive airway pressure)  dependence   . Depression   . Diverticulosis    diverticular bleeding  . Diverticulosis   . GERD (gastroesophageal reflux disease)   . History of radiation therapy   . HOH (hard of hearing)    wears hearing aids  . Hyperlipidemia   . Hypoglycemia   . Lymphocytosis 06/2012  . Mastodynia   . Murmur    slight  . Nasal congestion   . Osteoporosis   . Osteoporosis   . Prostate cancer ([place])   . Sleep apnea    wears CPAP  . Urinary retention with incomplete bladder emptying    pt uses bathroom and within 30 mins needs to go again  . Vitamin D deficiency     Family History  Problem Relation Age  of Onset  . Prostate cancer Father   . CAD Mother   . Arthritis-Osteo Brother   . Colon cancer Neg Hx   . Stomach cancer Neg Hx     Past Surgical History:  Procedure Laterality Date  . AMPUTATION TOE Right 09/20/2017   Procedure: Right 4th toe amputation;  Surgeon: Wylene Simmer, MD;  Location: [facility];  Service: Orthopedics;  Laterality: Right;  foot block  . AMPUTATION TOE Right 02/05/2019   Procedure: RIGHT GREAT TOE AMPUTATION;  Surgeon: Leandrew Koyanagi, MD;  Location: [facility];  Service: Orthopedics;  Laterality: Right;  . BACK SURGERY  04/2013  . back surgury     f  . BLEPHAROPLASTY Bilateral 02/23/15  . CATARACT EXTRACTION    . COLONOSCOPY    . COLONOSCOPY N/A 10/02/2017   Procedure: COLONOSCOPY;  Surgeon: Doran Stabler, MD;  Location: [facility];  Service: Gastroenterology;  Laterality: N/A;  . COLONOSCOPY WITH PROPOFOL N/A 10/01/2017   Procedure: COLONOSCOPY WITH PROPOFOL;  Surgeon: Doran Stabler, MD;  Location: [facility];  Service: Gastroenterology;  Laterality: N/A;  . COLONOSCOPY WITH PROPOFOL N/A 03/07/2018   Procedure: COLONOSCOPY WITH PROPOFOL;  Surgeon: [name], MD;  Location: [facility];  Service: Endoscopy;  Laterality: N/A;  . ESOPHAGOGASTRODUODENOSCOPY (EGD) WITH PROPOFOL N/A 10/01/2017   Procedure: ESOPHAGOGASTRODUODENOSCOPY (EGD) WITH PROPOFOL;  Surgeon: Doran Stabler, MD;  [facility];  Service: Gastroenterology;  Laterality: N/A;  . EYE SURGERY Bilateral   . FINGER SURGERY Right    right thumb  . HARDWARE REMOVAL Right 05/15/2016   Procedure: Right Lumbar Two-Lumbar Five Removal of Hardware;  Surgeon: Kristeen Miss, MD;  Location: [facility];  Service: Neurosurgery;  Laterality: Right;  Right L2-5 removal of hardware  . INGUINAL HERNIA REPAIR     x4  . IR RADIOLOGIST EVAL & MGMT  07/10/2018  . IR RADIOLOGIST EVAL & MGMT  09/05/2018  . IR SACROPLASTY BILATERAL  07/15/2018  . REPAIR SPIGELIAN  HERNIA  2011  . [place] ,2014  . TONSILLECTOMY    . UPPER GASTROINTESTINAL ENDOSCOPY    . VASECTOMY     Social History   Occupational History  . 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 Smoking status: Never Smoker  . Smokeless tobacco: Never Used  Substance and Sexual Activity  . Alcohol use: No  . Drug use: No  . Sexual activity: Never

## 2019-04-22 DIAGNOSIS — R293 Abnormal posture: Secondary | ICD-10-CM | POA: Diagnosis not present

## 2019-04-22 DIAGNOSIS — R2681 Unsteadiness on feet: Secondary | ICD-10-CM | POA: Diagnosis not present

## 2019-04-22 DIAGNOSIS — M6281 Muscle weakness (generalized): Secondary | ICD-10-CM | POA: Diagnosis not present

## 2019-04-22 DIAGNOSIS — R2689 Other abnormalities of gait and mobility: Secondary | ICD-10-CM | POA: Diagnosis not present

## 2019-04-22 DIAGNOSIS — M545 Low back pain: Secondary | ICD-10-CM | POA: Diagnosis not present

## 2019-04-23 ENCOUNTER — Ambulatory Visit: Payer: Self-pay

## 2019-04-23 DIAGNOSIS — L97512 Non-pressure chronic ulcer of other part of right foot with fat layer exposed: Secondary | ICD-10-CM | POA: Diagnosis not present

## 2019-04-23 DIAGNOSIS — G4730 Sleep apnea, unspecified: Secondary | ICD-10-CM | POA: Diagnosis not present

## 2019-04-23 DIAGNOSIS — I482 Chronic atrial fibrillation, unspecified: Secondary | ICD-10-CM | POA: Diagnosis not present

## 2019-04-23 DIAGNOSIS — G9009 Other idiopathic peripheral autonomic neuropathy: Secondary | ICD-10-CM | POA: Diagnosis not present

## 2019-04-23 DIAGNOSIS — I2510 Atherosclerotic heart disease of native coronary artery without angina pectoris: Secondary | ICD-10-CM | POA: Diagnosis not present

## 2019-04-23 DIAGNOSIS — I10 Essential (primary) hypertension: Secondary | ICD-10-CM | POA: Diagnosis not present

## 2019-04-23 NOTE — Addendum Note (Signed)
Addended by: Minda Ditto, Geoffery Spruce on: 04/23/2019 02:35 PM   Modules accepted: Orders

## 2019-04-24 DIAGNOSIS — R293 Abnormal posture: Secondary | ICD-10-CM | POA: Diagnosis not present

## 2019-04-24 DIAGNOSIS — R2681 Unsteadiness on feet: Secondary | ICD-10-CM | POA: Diagnosis not present

## 2019-04-24 DIAGNOSIS — M6281 Muscle weakness (generalized): Secondary | ICD-10-CM | POA: Diagnosis not present

## 2019-04-24 DIAGNOSIS — R2689 Other abnormalities of gait and mobility: Secondary | ICD-10-CM | POA: Diagnosis not present

## 2019-04-24 DIAGNOSIS — M545 Low back pain: Secondary | ICD-10-CM | POA: Diagnosis not present

## 2019-04-29 DIAGNOSIS — M6281 Muscle weakness (generalized): Secondary | ICD-10-CM | POA: Diagnosis not present

## 2019-04-29 DIAGNOSIS — M545 Low back pain: Secondary | ICD-10-CM | POA: Diagnosis not present

## 2019-04-29 DIAGNOSIS — R2689 Other abnormalities of gait and mobility: Secondary | ICD-10-CM | POA: Diagnosis not present

## 2019-04-29 DIAGNOSIS — R2681 Unsteadiness on feet: Secondary | ICD-10-CM | POA: Diagnosis not present

## 2019-04-29 DIAGNOSIS — R293 Abnormal posture: Secondary | ICD-10-CM | POA: Diagnosis not present

## 2019-05-03 ENCOUNTER — Telehealth: Payer: Self-pay

## 2019-05-03 DIAGNOSIS — Z20822 Contact with and (suspected) exposure to covid-19: Secondary | ICD-10-CM

## 2019-05-03 NOTE — Telephone Encounter (Signed)
Called pt. To discuss poss. exposure to COVID 19 at recent [place].  Offered appt. For testing.  Pt. Agreed; appt. given for 5/31 @ 2:00 PM.  Pt. Verb. Understanding.

## 2019-05-03 NOTE — Telephone Encounter (Signed)
This encounter was created in error - please disregard.

## 2019-05-04 ENCOUNTER — Other Ambulatory Visit: Payer: Medicare Other

## 2019-05-04 DIAGNOSIS — Z20822 Contact with and (suspected) exposure to covid-19: Secondary | ICD-10-CM

## 2019-05-05 LAB — NOVEL CORONAVIRUS, NAA: SARS-CoV-2, NAA: NOT DETECTED

## 2019-05-06 ENCOUNTER — Telehealth: Payer: Self-pay

## 2019-05-06 NOTE — Telephone Encounter (Signed)
Patient called concerning physical therapy, long x-rays, and Bone Density.  Advised patient that an order for physical therapy was put into Epic.  Would like a CB to discuss.  CB# is [phone].  Please advise.  Thank you.

## 2019-05-07 ENCOUNTER — Encounter (HOSPITAL_BASED_OUTPATIENT_CLINIC_OR_DEPARTMENT_OTHER): Payer: Medicare Other | Attending: Physician Assistant

## 2019-05-07 ENCOUNTER — Other Ambulatory Visit: Payer: Self-pay | Admitting: Radiology

## 2019-05-07 DIAGNOSIS — M810 Age-related osteoporosis without current pathological fracture: Secondary | ICD-10-CM

## 2019-05-07 DIAGNOSIS — I482 Chronic atrial fibrillation, unspecified: Secondary | ICD-10-CM | POA: Insufficient documentation

## 2019-05-07 DIAGNOSIS — I2510 Atherosclerotic heart disease of native coronary artery without angina pectoris: Secondary | ICD-10-CM | POA: Insufficient documentation

## 2019-05-07 DIAGNOSIS — L84 Corns and callosities: Secondary | ICD-10-CM | POA: Insufficient documentation

## 2019-05-07 DIAGNOSIS — Z872 Personal history of diseases of the skin and subcutaneous tissue: Secondary | ICD-10-CM | POA: Insufficient documentation

## 2019-05-07 DIAGNOSIS — Z09 Encounter for follow-up examination after completed treatment for conditions other than malignant neoplasm: Secondary | ICD-10-CM | POA: Insufficient documentation

## 2019-05-07 DIAGNOSIS — G4730 Sleep apnea, unspecified: Secondary | ICD-10-CM | POA: Insufficient documentation

## 2019-05-07 DIAGNOSIS — I10 Essential (primary) hypertension: Secondary | ICD-10-CM | POA: Insufficient documentation

## 2019-05-07 DIAGNOSIS — M8000XA Age-related osteoporosis with current pathological fracture, unspecified site, initial encounter for fracture: Secondary | ICD-10-CM

## 2019-05-08 DIAGNOSIS — Z89411 Acquired absence of right great toe: Secondary | ICD-10-CM | POA: Diagnosis not present

## 2019-05-08 DIAGNOSIS — I2510 Atherosclerotic heart disease of native coronary artery without angina pectoris: Secondary | ICD-10-CM | POA: Diagnosis not present

## 2019-05-08 DIAGNOSIS — K2270 Barrett's esophagus without dysplasia: Secondary | ICD-10-CM | POA: Diagnosis not present

## 2019-05-08 DIAGNOSIS — G4733 Obstructive sleep apnea (adult) (pediatric): Secondary | ICD-10-CM | POA: Diagnosis not present

## 2019-05-08 DIAGNOSIS — D649 Anemia, unspecified: Secondary | ICD-10-CM | POA: Diagnosis not present

## 2019-05-08 DIAGNOSIS — E785 Hyperlipidemia, unspecified: Secondary | ICD-10-CM | POA: Diagnosis not present

## 2019-05-08 DIAGNOSIS — I10 Essential (primary) hypertension: Secondary | ICD-10-CM | POA: Diagnosis not present

## 2019-05-08 DIAGNOSIS — L97511 Non-pressure chronic ulcer of other part of right foot limited to breakdown of skin: Secondary | ICD-10-CM | POA: Diagnosis not present

## 2019-05-08 DIAGNOSIS — I482 Chronic atrial fibrillation, unspecified: Secondary | ICD-10-CM | POA: Diagnosis not present

## 2019-05-08 DIAGNOSIS — M810 Age-related osteoporosis without current pathological fracture: Secondary | ICD-10-CM | POA: Diagnosis not present

## 2019-05-08 DIAGNOSIS — K219 Gastro-esophageal reflux disease without esophagitis: Secondary | ICD-10-CM | POA: Diagnosis not present

## 2019-05-08 DIAGNOSIS — G9009 Other idiopathic peripheral autonomic neuropathy: Secondary | ICD-10-CM | POA: Diagnosis not present

## 2019-05-08 DIAGNOSIS — H2589 Other age-related cataract: Secondary | ICD-10-CM | POA: Diagnosis not present

## 2019-05-09 ENCOUNTER — Telehealth: Payer: Self-pay | Admitting: *Deleted

## 2019-05-09 ENCOUNTER — Telehealth: Payer: Self-pay | Admitting: Physical Therapy

## 2019-05-09 NOTE — Telephone Encounter (Signed)
Spoke with patient regarding PT referral from Dr Louanne Skye. He says that [facility] is in his building and he would prefer to go there. I advised him to contact Dr Otho Ket office to request a referral be sent there and to contact us with any further questions. Sabriya Yono C. Raymondo Garcialopez PT, DPT 05/09/19 9:45 AM

## 2019-05-09 NOTE — Telephone Encounter (Signed)
Pt called and given negative COVID results; he verbalized understanding; he says that he is a resident of [facility]; will route to provider for notification.Charles [name]

## 2019-05-09 NOTE — Telephone Encounter (Signed)
I called and advised him to call [facility] (gave him the number) to schedule the PT, I advised that he can go to [facility] to get "long Xray" of his spine done there, and advised that [facility] would be calling to schedule bone density appt. He states that he understands this.

## 2019-05-12 DIAGNOSIS — G4733 Obstructive sleep apnea (adult) (pediatric): Secondary | ICD-10-CM | POA: Diagnosis not present

## 2019-05-12 DIAGNOSIS — G9009 Other idiopathic peripheral autonomic neuropathy: Secondary | ICD-10-CM | POA: Diagnosis not present

## 2019-05-12 DIAGNOSIS — I2510 Atherosclerotic heart disease of native coronary artery without angina pectoris: Secondary | ICD-10-CM | POA: Diagnosis not present

## 2019-05-12 DIAGNOSIS — I482 Chronic atrial fibrillation, unspecified: Secondary | ICD-10-CM | POA: Diagnosis not present

## 2019-05-12 DIAGNOSIS — D649 Anemia, unspecified: Secondary | ICD-10-CM | POA: Diagnosis not present

## 2019-05-12 DIAGNOSIS — L97511 Non-pressure chronic ulcer of other part of right foot limited to breakdown of skin: Secondary | ICD-10-CM | POA: Diagnosis not present

## 2019-05-14 ENCOUNTER — Other Ambulatory Visit: Payer: Self-pay

## 2019-05-14 ENCOUNTER — Ambulatory Visit (INDEPENDENT_AMBULATORY_CARE_PROVIDER_SITE_OTHER): Payer: Medicare Other | Admitting: Orthopaedic Surgery

## 2019-05-14 ENCOUNTER — Encounter: Payer: Self-pay | Admitting: Orthopaedic Surgery

## 2019-05-14 DIAGNOSIS — I482 Chronic atrial fibrillation, unspecified: Secondary | ICD-10-CM | POA: Diagnosis not present

## 2019-05-14 DIAGNOSIS — Z09 Encounter for follow-up examination after completed treatment for conditions other than malignant neoplasm: Secondary | ICD-10-CM | POA: Diagnosis not present

## 2019-05-14 DIAGNOSIS — I10 Essential (primary) hypertension: Secondary | ICD-10-CM | POA: Diagnosis not present

## 2019-05-14 DIAGNOSIS — G4730 Sleep apnea, unspecified: Secondary | ICD-10-CM | POA: Diagnosis not present

## 2019-05-14 DIAGNOSIS — G9009 Other idiopathic peripheral autonomic neuropathy: Secondary | ICD-10-CM | POA: Diagnosis not present

## 2019-05-14 DIAGNOSIS — M869 Osteomyelitis, unspecified: Secondary | ICD-10-CM

## 2019-05-14 DIAGNOSIS — Z872 Personal history of diseases of the skin and subcutaneous tissue: Secondary | ICD-10-CM | POA: Diagnosis not present

## 2019-05-14 DIAGNOSIS — L97519 Non-pressure chronic ulcer of other part of right foot with unspecified severity: Secondary | ICD-10-CM | POA: Diagnosis not present

## 2019-05-14 DIAGNOSIS — I2510 Atherosclerotic heart disease of native coronary artery without angina pectoris: Secondary | ICD-10-CM | POA: Diagnosis not present

## 2019-05-14 DIAGNOSIS — L84 Corns and callosities: Secondary | ICD-10-CM | POA: Diagnosis not present

## 2019-05-14 NOTE — Progress Notes (Signed)
Office Visit Note   Patient: Charles [name]           Date of Birth: 1931/02/09           MRN: [id number] Visit Date: 05/14/2019              Requested by: Leanna Battles, MD [place], [place] [address] PCP: Leanna Battles, MD   Assessment & Plan: Visit Diagnoses:  1. Osteomyelitis of second toe of right foot ([place])     Plan: Impression is healed right second toe wound/ulcer.  Patient can continue weightbearing as tolerated in a postoperative or regular shoe with the toe orthotic to help offload the second toe.  Do not believe we need to refill his antibiotic.  He will follow-up with [place] as needed.  He will call with any concerning symptoms in the meantime.  Follow-Up Instructions: Return if symptoms worsen or fail to improve.   Orders:  No orders of the defined types were placed in this encounter.  No orders of the defined types were placed in this encounter.     Procedures: No procedures performed   Clinical Data: No additional findings.   Subjective: Chief Complaint  Patient presents with  . Right Foot - Follow-up    HPI patient is a pleasant 85 year old gentleman who presents our clinic today for follow-up of his right foot second toe.  History of likely acute osteomyelitis which was seen on MRI approximately 6 weeks ago.  He has almost finished his Bactrim and was discharged from wound clinic today.  He is continuing to weight-bear in a postoperative shoe wearing a toe orthotic to help offload pressure of the second toe.  Still complaining of a hard area to the tip of the toe which is quite bothersome when pressure is applied.Overall, doing much better.  Review of Systems as detailed in HPI.  All others reviewed and are negative.   Objective: Vital Signs: There were no vitals taken for this visit.  Physical Exam well-developed well-nourished gentleman in no acute distress.  Alert and oriented x3.  Ortho Exam examination of the right foot  second toe reveals a hard callus.  No evidence of infection or cellulitis.  Specialty Comments:  No specialty comments available.  Imaging: No new imaging   PMFS History: Patient Active Problem List   Diagnosis Date Noted  . Osteomyelitis of second toe of right foot ([place]) 04/03/2019  . Hammertoe of second toe of right foot 04/03/2019  . Ulcer of second toe of right foot ([place]) 03/21/2019  . Osteomyelitis of great toe of right foot ([place]) 12/05/2018  . Sacral insufficiency fracture 07/04/2018  . Unsteady gait 07/04/2018  . Mild cognitive impairment 07/04/2018  . Neuropathy 07/04/2018  . CAD (coronary artery disease) 05/29/2018  . AVM (arteriovenous malformation) of colon   . Radiation proctitis   . Amputation of right great toe ([place]) 03/01/2018  . Diverticulosis of colon with hemorrhage   . Acute blood loss anemia   . History of back surgery 10/03/2017  . Rectal bleeding 10/03/2017  . Hematochezia   . Lower GI bleeding 09/29/2017  . Treatment-emergent central sleep apnea 06/25/2017  . Chronic bilateral low back pain with bilateral sciatica 01/17/2017  . Post laminectomy syndrome 01/17/2017  . Chronic pain syndrome 01/17/2017  . Sacroiliitis ([place]) 01/17/2017  . Piriformis syndrome of right side 01/17/2017  . Malignant neoplasm of prostate ([place]) 07/19/2016  . Fixation hardware in spine 05/15/2016  . Hypoglycemic reaction 02/02/2015  .  Tremor, essential 12/11/2014  . OSA on CPAP 04/24/2014  . 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 Hypersomnia with sleep apnea, unspecified 04/24/2014  . Depression 04/18/2013  . BPH (benign prostatic hyperplasia) 04/18/2013  . Impingement syndrome of right shoulder 04/14/2013    Class: Acute  . Scoliosis or kyphoscoliosis 04/08/2013    Class: Chronic  . Lumbosacral spondylosis without myelopathy 04/08/2013    Class: Chronic  . Acute respiratory failure ([place]) 04/08/2013  . Essential hypertension 04/08/2013  . Lymphocytosis   .  Diaphragmatic hernia 11/21/2010  . DIVERTICULAR DISEASE 11/21/2010  . BARRETT'S ESOPHAGUS 02/22/2010  . GERD 07/20/2008  . INGUINAL HERNIA, LEFT, SMALL 07/20/2008  . OTH VENTRAL HERN W/O MENTION OBSTRUCTION/GANGREN 07/20/2008  . CONSTIPATION 07/20/2008  . DYSPHAGIA 07/20/2008   Past Medical History:  Diagnosis Date  . Anemia   . Arthritis   . AVM (arteriovenous malformation)   . Barrett's esophagus   . BPH (benign prostatic hyperplasia)   . Cataract   . Constipation   . CPAP (continuous positive airway pressure) dependence   . Depression   . Diverticulosis    diverticular bleeding  . Diverticulosis   . GERD (gastroesophageal reflux disease)   . History of radiation therapy   . HOH (hard of hearing)    wears hearing aids  . Hyperlipidemia   . Hypoglycemia   . Lymphocytosis 06/2012  . Mastodynia   . Murmur    slight  . Nasal congestion   . Osteoporosis   . Osteoporosis   . Prostate cancer ([place])   . Sleep apnea    wears CPAP  . Urinary retention with incomplete bladder emptying    pt uses bathroom and within 30 mins needs to go again  . Vitamin D deficiency     Family History  Problem Relation Age of Onset  . Prostate cancer Father   . CAD Mother   . Arthritis-Osteo Brother   . Colon cancer Neg Hx   . Stomach cancer Neg Hx     Past Surgical History:  Procedure Laterality Date  . AMPUTATION TOE Right 09/20/2017   Procedure: Right 4th toe amputation;  Surgeon: Wylene Simmer, MD;  Location: [facility];  Service: Orthopedics;  Laterality: Right;  foot block  . AMPUTATION TOE Right 02/05/2019   Procedure: RIGHT GREAT TOE AMPUTATION;  Surgeon: Leandrew Koyanagi, MD;  Location: [facility];  Service: Orthopedics;  Laterality: Right;  . BACK SURGERY  04/2013  . back surgury     f  . BLEPHAROPLASTY Bilateral 02/23/15  . CATARACT EXTRACTION    . COLONOSCOPY    . COLONOSCOPY N/A 10/02/2017   Procedure: COLONOSCOPY;  Surgeon: Doran Stabler,  MD;  Location: [facility];  Service: Gastroenterology;  Laterality: N/A;  . COLONOSCOPY WITH PROPOFOL N/A 10/01/2017   Procedure: COLONOSCOPY WITH PROPOFOL;  Surgeon: Doran Stabler, MD;  [facility];  Service: Gastroenterology;  Laterality: N/A;  . COLONOSCOPY WITH PROPOFOL N/A 03/07/2018   Procedure: COLONOSCOPY WITH PROPOFOL;  Surgeon: [name], MD;  [facility];  Service: Endoscopy;  Laterality: N/A;  . ESOPHAGOGASTRODUODENOSCOPY (EGD) WITH PROPOFOL N/A 10/01/2017   Procedure: ESOPHAGOGASTRODUODENOSCOPY (EGD) WITH PROPOFOL;  Surgeon: Doran Stabler, MD;  [facility];  Service: Gastroenterology;  Laterality: N/A;  . EYE SURGERY Bilateral   . FINGER SURGERY Right    right thumb  . HARDWARE REMOVAL Right 05/15/2016   Procedure: Right Lumbar Two-Lumbar Five Removal of Hardware;  Surgeon: Kristeen Miss, MD;  [facility];  Service: Neurosurgery;  Laterality: Right;  Right L2-5 removal of hardware  . INGUINAL HERNIA REPAIR     x4  . IR RADIOLOGIST EVAL & MGMT  07/10/2018  . IR RADIOLOGIST EVAL & MGMT  09/05/2018  . IR SACROPLASTY BILATERAL  07/15/2018  . REPAIR SPIGELIAN HERNIA  2011  . [place] ,2014  . TONSILLECTOMY    . UPPER GASTROINTESTINAL ENDOSCOPY    . VASECTOMY     Social History   Occupational History  . 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 Smoking status: Never Smoker  . Smokeless tobacco: Never Used  Substance and Sexual Activity  . Alcohol use: No  . Drug use: No  . Sexual activity: Never

## 2019-05-15 ENCOUNTER — Ambulatory Visit: Payer: Medicare Other | Admitting: Specialist

## 2019-05-16 ENCOUNTER — Telehealth: Payer: Self-pay | Admitting: Orthopaedic Surgery

## 2019-05-16 DIAGNOSIS — D649 Anemia, unspecified: Secondary | ICD-10-CM | POA: Diagnosis not present

## 2019-05-16 DIAGNOSIS — I2510 Atherosclerotic heart disease of native coronary artery without angina pectoris: Secondary | ICD-10-CM | POA: Diagnosis not present

## 2019-05-16 DIAGNOSIS — L97511 Non-pressure chronic ulcer of other part of right foot limited to breakdown of skin: Secondary | ICD-10-CM | POA: Diagnosis not present

## 2019-05-16 DIAGNOSIS — G4733 Obstructive sleep apnea (adult) (pediatric): Secondary | ICD-10-CM | POA: Diagnosis not present

## 2019-05-16 DIAGNOSIS — I482 Chronic atrial fibrillation, unspecified: Secondary | ICD-10-CM | POA: Diagnosis not present

## 2019-05-16 DIAGNOSIS — G9009 Other idiopathic peripheral autonomic neuropathy: Secondary | ICD-10-CM | POA: Diagnosis not present

## 2019-05-16 NOTE — Telephone Encounter (Signed)
Pt LMOM wanting to know the future outcome of the toe/foot he had the mri on & also wants to know if he needs a antibiotic. Pt request a call back from Dr Erlinda Hong or Ria Comment @ [phone].

## 2019-05-19 DIAGNOSIS — R2689 Other abnormalities of gait and mobility: Secondary | ICD-10-CM | POA: Diagnosis not present

## 2019-05-19 DIAGNOSIS — M5136 Other intervertebral disc degeneration, lumbar region: Secondary | ICD-10-CM | POA: Diagnosis not present

## 2019-05-19 DIAGNOSIS — R2681 Unsteadiness on feet: Secondary | ICD-10-CM | POA: Diagnosis not present

## 2019-05-19 DIAGNOSIS — R293 Abnormal posture: Secondary | ICD-10-CM | POA: Diagnosis not present

## 2019-05-19 DIAGNOSIS — M545 Low back pain: Secondary | ICD-10-CM | POA: Diagnosis not present

## 2019-05-19 DIAGNOSIS — C61 Malignant neoplasm of prostate: Secondary | ICD-10-CM | POA: Diagnosis not present

## 2019-05-19 DIAGNOSIS — M6281 Muscle weakness (generalized): Secondary | ICD-10-CM | POA: Diagnosis not present

## 2019-05-19 NOTE — Telephone Encounter (Signed)
Please advise 

## 2019-05-19 NOTE — Telephone Encounter (Signed)
Called and discussed everything with him.

## 2019-05-20 NOTE — Telephone Encounter (Signed)
noted 

## 2019-05-22 DIAGNOSIS — M6281 Muscle weakness (generalized): Secondary | ICD-10-CM | POA: Diagnosis not present

## 2019-05-22 DIAGNOSIS — R2681 Unsteadiness on feet: Secondary | ICD-10-CM | POA: Diagnosis not present

## 2019-05-22 DIAGNOSIS — R2689 Other abnormalities of gait and mobility: Secondary | ICD-10-CM | POA: Diagnosis not present

## 2019-05-22 DIAGNOSIS — R293 Abnormal posture: Secondary | ICD-10-CM | POA: Diagnosis not present

## 2019-05-22 DIAGNOSIS — M545 Low back pain: Secondary | ICD-10-CM | POA: Diagnosis not present

## 2019-05-26 DIAGNOSIS — R3914 Feeling of incomplete bladder emptying: Secondary | ICD-10-CM | POA: Diagnosis not present

## 2019-05-28 ENCOUNTER — Ambulatory Visit
Admission: RE | Admit: 2019-05-28 | Discharge: 2019-05-28 | Disposition: A | Discharge status: Home / Self Care | Payer: Medicare Other | Source: Ambulatory Visit | Attending: Specialist | Admitting: Specialist

## 2019-05-28 ENCOUNTER — Other Ambulatory Visit: Payer: Self-pay

## 2019-05-28 DIAGNOSIS — M8000XA Age-related osteoporosis with current pathological fracture, unspecified site, initial encounter for fracture: Secondary | ICD-10-CM

## 2019-05-28 DIAGNOSIS — M4030 Flatback syndrome, site unspecified: Secondary | ICD-10-CM

## 2019-05-28 DIAGNOSIS — M47815 Spondylosis without myelopathy or radiculopathy, thoracolumbar region: Secondary | ICD-10-CM | POA: Diagnosis not present

## 2019-05-28 DIAGNOSIS — M4125 Other idiopathic scoliosis, thoracolumbar region: Secondary | ICD-10-CM

## 2019-05-28 DIAGNOSIS — M545 Low back pain, unspecified: Secondary | ICD-10-CM

## 2019-05-29 ENCOUNTER — Encounter: Payer: Self-pay | Admitting: Specialist

## 2019-05-29 ENCOUNTER — Ambulatory Visit (INDEPENDENT_AMBULATORY_CARE_PROVIDER_SITE_OTHER): Payer: Medicare Other | Admitting: Specialist

## 2019-05-29 VITALS — BP 127/65 | HR 59 | Ht 66.0 in | Wt 169.0 lb

## 2019-05-29 DIAGNOSIS — M6281 Muscle weakness (generalized): Secondary | ICD-10-CM | POA: Diagnosis not present

## 2019-05-29 DIAGNOSIS — M545 Low back pain: Secondary | ICD-10-CM | POA: Diagnosis not present

## 2019-05-29 DIAGNOSIS — M4015 Other secondary kyphosis, thoracolumbar region: Secondary | ICD-10-CM

## 2019-05-29 DIAGNOSIS — M48062 Spinal stenosis, lumbar region with neurogenic claudication: Secondary | ICD-10-CM

## 2019-05-29 DIAGNOSIS — M4125 Other idiopathic scoliosis, thoracolumbar region: Secondary | ICD-10-CM

## 2019-05-29 DIAGNOSIS — R2681 Unsteadiness on feet: Secondary | ICD-10-CM | POA: Diagnosis not present

## 2019-05-29 DIAGNOSIS — R293 Abnormal posture: Secondary | ICD-10-CM | POA: Diagnosis not present

## 2019-05-29 DIAGNOSIS — R2689 Other abnormalities of gait and mobility: Secondary | ICD-10-CM | POA: Diagnosis not present

## 2019-05-29 MED ORDER — HYDROCODONE-ACETAMINOPHEN 5-325 MG PO TABS: 1.0000 | ORAL_TABLET | Freq: Four times a day (QID) | ORAL | 0 refills | Status: DC | PRN

## 2019-05-29 NOTE — Progress Notes (Signed)
Office Visit Note   Patient: Charles [name]           Date of Birth: September 03, 1931           MRN: [id number] Visit Date: 05/29/2019              Requested by: Leanna Battles, MD [place],  [place] [address] PCP: Leanna Battles, MD   Assessment & Plan: Visit Diagnoses:  1. Other idiopathic scoliosis, thoracolumbar region   2. Spinal stenosis of lumbar region with neurogenic claudication   3. Other secondary kyphosis, thoracolumbar region     Plan: Avoid bending, stooping and avoid lifting weights greater than 10 lbs. Avoid prolong standing and walking. Avoid frequent bending and stooping  No lifting greater than 10 lbs. May use ice or moist heat for pain. Weight loss is of benefit. Handicap license is approved. Referral to Dr. Hardin Negus and [facility] for painful T-L scoliosis, collapsing with kyphosis. PT for hip anterior stretching exercises. Hydrocodone for pain.  Follow-Up Instructions: Return in about 6 weeks (around 07/10/2019).   Orders:  Orders Placed This Encounter  Procedures  . Ambulatory referral to Pain Clinic   Meds ordered this encounter  Medications  . HYDROcodone-acetaminophen (NORCO/VICODIN) 5-325 MG tablet    Sig: Take 1 tablet by mouth every 6 (six) hours as needed for moderate pain.    Dispense:  30 tablet    Refill:  0      Procedures: No procedures performed   Clinical Data: No additional findings.   Subjective: Chief Complaint  Patient presents with  . Lower Back - Follow-up    85 year old male with history of multilevel lumbar laminectomy, he is having increasing stoop and leaning to the right side since surgical fusion surgeries and development of a progressive proximal junctional kyphosis. There is numbness both feet and left groin and left posterior buttock. He is at [facility] and his wife is in the acute Care unit with treatment for infection following recent thoracolumbar surgery for  stenosis and lower thoracic fracture.   Review of Systems  Constitutional: Negative.   HENT: Negative.   Eyes: Negative.   Respiratory: Negative.   Cardiovascular: Negative.   Gastrointestinal: Negative.   Endocrine: Negative.   Genitourinary: Negative.   Musculoskeletal: Negative.   Skin: Negative.   Allergic/Immunologic: Negative.   Neurological: Negative.   Hematological: Negative.   Psychiatric/Behavioral: Negative.      Objective: Vital Signs: BP 127/65 (BP Location: Left Arm, Patient Position: Sitting)   Pulse (!) 59   Ht 5\' 6"  (1.676 m)   Wt 169 lb (76.7 kg)   BMI 27.28 kg/m   Physical Exam  Back Exam   Tenderness  The patient is experiencing tenderness in the lumbar.  Range of Motion  Extension: abnormal  Flexion: normal  Lateral bend right: normal  Lateral bend left: normal  Rotation right: normal  Rotation left: normal   Muscle Strength  Right Quadriceps:  5/5  Left Quadriceps:  5/5  Right Hamstrings:  5/5  Left Hamstrings:  5/5   Tests  Straight leg raise right: negative Straight leg raise left: negative  Reflexes  Patellar: 0/4 Achilles: 0/4 Babinski's sign: normal   Other  Toe walk: normal Heel walk: normal Sensation: normal Gait: normal  Erythema: no back redness Scars: absent      Specialty Comments:  No specialty comments available.  Imaging: Dg Scoliosis Eval Complete Spine 2 Or 3 Views  Result Date:  05/28/2019 CLINICAL DATA:  Back pain. Evaluate for spinal curvature. Difficulty time standing straight up as walks with walker. EXAM: DG SCOLIOSIS EVAL COMPLETE SPINE 2-3V COMPARISON:  CT lumbar spine 07/03/2018 and plain films of the lumbar spine 04/21/2019. Thoracic spine 07/03/2016 FINDINGS: Examination covers nearly the entire spine from cervical to sacral region. There is no significant curvature of the thoracic spine. There is mild curvature of the lumbar spine convex left without significant change. Angle of curvature of  the lumbar spine is approximately 10 degrees from the inferior endplate of L2 to the superior endplate of S1. Mild spondylosis throughout the thoracolumbar spine. Lateral view is suboptimal for accurate interpretation of the disc spaces and vertebral body heights. Posterior fusion hardware along the left side of the lumbosacral spine from L2-S1 intact and unchanged. Interbody fusion at the L2-3, L3-4 at L4-5 levels. Evidence of previous bilateral sacroplasty. Degenerative change of the visualized lower cervical spine. Mild cardiomegaly. Suggestion of hiatal hernia. IMPRESSION: Mild spondylosis throughout the thoracolumbar spine. No significant curvature of the thoracic spine with mild curvature of the lumbar spine convex left with angle of curvature 10 degrees from L2-S1. Posterior fusion hardware intact and unchanged from L2-S1. Electronically Signed   By: Marin Olp M.D.   On: 05/28/2019 17:12     PMFS History: Patient Active Problem List   Diagnosis Date Noted  . Scoliosis or kyphoscoliosis 04/08/2013    Priority: High    Class: Chronic  . Lumbosacral spondylosis without myelopathy 04/08/2013    Priority: High    Class: Chronic  . Impingement syndrome of right shoulder 04/14/2013    Priority: Medium    Class: Acute  . Osteomyelitis of second toe of right foot ([place]) 04/03/2019  . Hammertoe of second toe of right foot 04/03/2019  . Ulcer of second toe of right foot ([place]) 03/21/2019  . Osteomyelitis of great toe of right foot ([place]) 12/05/2018  . Sacral insufficiency fracture 07/04/2018  . Unsteady gait 07/04/2018  . Mild cognitive impairment 07/04/2018  . Neuropathy 07/04/2018  . CAD (coronary artery disease) 05/29/2018  . AVM (arteriovenous malformation) of colon   . Radiation proctitis   . Amputation of right great toe ([place]) 03/01/2018  . Diverticulosis of colon with hemorrhage   . Acute blood loss anemia   . History of back surgery 10/03/2017  . Rectal bleeding 10/03/2017  .  Hematochezia   . Lower GI bleeding 09/29/2017  . Treatment-emergent central sleep apnea 06/25/2017  . Chronic bilateral low back pain with bilateral sciatica 01/17/2017  . Post laminectomy syndrome 01/17/2017  . Chronic pain syndrome 01/17/2017  . Sacroiliitis ([place]) 01/17/2017  . Piriformis syndrome of right side 01/17/2017  . Malignant neoplasm of prostate ([place]) 07/19/2016  . Fixation hardware in spine 05/15/2016  . Hypoglycemic reaction 02/02/2015  . Tremor, essential 12/11/2014  . OSA on CPAP 04/24/2014  . 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 Hypersomnia with sleep apnea, unspecified 04/24/2014  . Depression 04/18/2013  . BPH (benign prostatic hyperplasia) 04/18/2013  . Acute respiratory failure ([place]) 04/08/2013  . Essential hypertension 04/08/2013  . Lymphocytosis   . Diaphragmatic hernia 11/21/2010  . DIVERTICULAR DISEASE 11/21/2010  . BARRETT'S ESOPHAGUS 02/22/2010  . GERD 07/20/2008  . INGUINAL HERNIA, LEFT, SMALL 07/20/2008  . OTH VENTRAL HERN W/O MENTION OBSTRUCTION/GANGREN 07/20/2008  . CONSTIPATION 07/20/2008  . DYSPHAGIA 07/20/2008   Past Medical History:  Diagnosis Date  . Anemia   . Arthritis   . AVM (arteriovenous malformation)   . Barrett's  esophagus   . BPH (benign prostatic hyperplasia)   . Cataract   . Constipation   . CPAP (continuous positive airway pressure) dependence   . Depression   . Diverticulosis    diverticular bleeding  . Diverticulosis   . GERD (gastroesophageal reflux disease)   . History of radiation therapy   . HOH (hard of hearing)    wears hearing aids  . Hyperlipidemia   . Hypoglycemia   . Lymphocytosis 06/2012  . Mastodynia   . Murmur    slight  . Nasal congestion   . Osteoporosis   . Osteoporosis   . Prostate cancer ([place])   . Sleep apnea    wears CPAP  . Urinary retention with incomplete bladder emptying    pt uses bathroom and within 30 mins needs to go again  . Vitamin D deficiency     Family History  Problem  Relation Age of Onset  . Prostate cancer Father   . CAD Mother   . Arthritis-Osteo Brother   . Colon cancer Neg Hx   . Stomach cancer Neg Hx     Past Surgical History:  Procedure Laterality Date  . AMPUTATION TOE Right 09/20/2017   Procedure: Right 4th toe amputation;  Surgeon: Wylene Simmer, MD;  Location: [facility];  Service: Orthopedics;  Laterality: Right;  foot block  . AMPUTATION TOE Right 02/05/2019   Procedure: RIGHT GREAT TOE AMPUTATION;  Surgeon: Leandrew Koyanagi, MD;  Location: [facility];  Service: Orthopedics;  Laterality: Right;  . BACK SURGERY  04/2013  . back surgury     f  . BLEPHAROPLASTY Bilateral 02/23/15  . CATARACT EXTRACTION    . COLONOSCOPY    . COLONOSCOPY N/A 10/02/2017   Procedure: COLONOSCOPY;  Surgeon: Doran Stabler, MD;  Location: [facility];  Service: Gastroenterology;  Laterality: N/A;  . COLONOSCOPY WITH PROPOFOL N/A 10/01/2017   Procedure: COLONOSCOPY WITH PROPOFOL;  Surgeon: Doran Stabler, MD;  [facility]: [facility];  Service: Gastroenterology;  Laterality: N/A;  . COLONOSCOPY WITH PROPOFOL N/A 03/07/2018   Procedure: COLONOSCOPY WITH PROPOFOL;  Surgeon: [name], MD;  [facility];  Service: Endoscopy;  Laterality: N/A;  . ESOPHAGOGASTRODUODENOSCOPY (EGD) WITH PROPOFOL N/A 10/01/2017   Procedure: ESOPHAGOGASTRODUODENOSCOPY (EGD) WITH PROPOFOL;  Surgeon: Doran Stabler, MD;  [facility];  Service: Gastroenterology;  Laterality: N/A;  . EYE SURGERY Bilateral   . FINGER SURGERY Right    right thumb  . HARDWARE REMOVAL Right 05/15/2016   Procedure: Right Lumbar Two-Lumbar Five Removal of Hardware;  Surgeon: Kristeen Miss, MD;  Location: [facility];  Service: Neurosurgery;  Laterality: Right;  Right L2-5 removal of hardware  . INGUINAL HERNIA REPAIR     x4  . IR RADIOLOGIST EVAL & MGMT  07/10/2018  . IR RADIOLOGIST EVAL & MGMT  09/05/2018  . IR SACROPLASTY BILATERAL  07/15/2018  .  REPAIR SPIGELIAN HERNIA  2011  . [place] ,2014  . TONSILLECTOMY    . UPPER GASTROINTESTINAL ENDOSCOPY    . VASECTOMY     Social History   Occupational History  . 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 Smoking status: Never Smoker  . Smokeless tobacco: Never Used  Substance and Sexual Activity  . Alcohol use: No  . Drug use: No  . Sexual activity: Never

## 2019-05-29 NOTE — Patient Instructions (Signed)
Avoid bending, stooping and avoid lifting weights greater than 10 lbs. Avoid prolong standing and walking. Avoid frequent bending and stooping  No lifting greater than 10 lbs. May use ice or moist heat for pain. Weight loss is of benefit. Handicap license is approved. Referral to Dr. Hardin Negus and [facility] for painful T-L scoliosis, collapsing with kyphosis. PT for hip anterior stretching exercises. Hydrocodone for pain.

## 2019-05-30 ENCOUNTER — Ambulatory Visit (INDEPENDENT_AMBULATORY_CARE_PROVIDER_SITE_OTHER): Payer: Medicare Other | Admitting: Orthopaedic Surgery

## 2019-05-30 ENCOUNTER — Other Ambulatory Visit: Payer: Self-pay

## 2019-05-30 ENCOUNTER — Ambulatory Visit (INDEPENDENT_AMBULATORY_CARE_PROVIDER_SITE_OTHER): Payer: Medicare Other

## 2019-05-30 DIAGNOSIS — M25521 Pain in right elbow: Secondary | ICD-10-CM

## 2019-05-30 DIAGNOSIS — M810 Age-related osteoporosis without current pathological fracture: Secondary | ICD-10-CM | POA: Diagnosis not present

## 2019-05-30 DIAGNOSIS — E7849 Other hyperlipidemia: Secondary | ICD-10-CM | POA: Diagnosis not present

## 2019-05-30 DIAGNOSIS — R739 Hyperglycemia, unspecified: Secondary | ICD-10-CM | POA: Diagnosis not present

## 2019-05-30 DIAGNOSIS — M2041 Other hammer toe(s) (acquired), right foot: Secondary | ICD-10-CM

## 2019-05-30 DIAGNOSIS — Z125 Encounter for screening for malignant neoplasm of prostate: Secondary | ICD-10-CM | POA: Diagnosis not present

## 2019-05-30 NOTE — Progress Notes (Signed)
Office Visit Note   Patient: Charles [name]           Date of Birth: [birth year]           MRN: [id number] Visit Date: 05/30/2019              Requested by: Charles Battles, MD [address] [place],  [place] [address] PCP: Charles Battles, MD   Assessment & Plan: Visit Diagnoses:  1. Pain in right elbow   2. Hammertoe of second toe of right foot     Plan: In terms of the toe we have prescribed for custom orthotic and shoes at the shoe market.  He is to continue to wear toe spacer.  The elbow is x-rays are negative.  I recommend compression and warm compresses help with resolution.  Signs and symptoms of infection were discussed today.  Questions encouraged and answered.  Follow-up as needed.  Follow-Up Instructions: Return if symptoms worsen or fail to improve.   Orders:  Orders Placed This Encounter  Procedures  . XR Elbow 2 Views Right   No orders of the defined types were placed in this encounter.     Procedures: No procedures performed   Clinical Data: No additional findings.   Subjective: Chief Complaint  Patient presents with  . Right Foot - Pain      Charles [name] is coming in today for evaluation of 2 separate issues.  The first issue is swelling and pain of the right elbow overlying the olecranon bursa.  Denies any injuries.  He says that he does notice it when he bumps it on things.  Denies any redness or evidence of infection.  He states that his elbow hurts when he tries to push push himself out of a chair.  Denies any numbness and tingling.  The second issue is the fixed hammertoe on the right second toe.Mar[place] Kitchen  He states that the callus is painful.  He states that the orthotic that we gave him does help but it is a hassle to put on and take off.  Denies any constitutional symptoms.   Review of Systems  Constitutional: Negative.   All other systems reviewed and are negative.    Objective: Vital Signs: There were no vitals taken for this visit.   Physical Exam Vitals signs and nursing note reviewed.  Constitutional:      Appearance: He is well-developed.  HENT:     Head: Normocephalic and atraumatic.  Eyes:     Pupils: Pupils are equal, round, and reactive to light.  Neck:     Musculoskeletal: Neck supple.  Pulmonary:     Effort: Pulmonary effort is normal.  Abdominal:     Palpations: Abdomen is soft.  Musculoskeletal: Normal range of motion.  Skin:    General: Skin is warm.  Neurological:     Mental Status: He is alert and oriented to person, place, and time.  Psychiatric:        Behavior: Behavior normal.        Thought Content: Thought content normal.        Judgment: Judgment normal.     Ortho Exam Right elbow exam shows a moderate olecranon bursitis without any signs of infection.  He has full range of motion of the elbow.  He has some tenderness along the distal insertion of the triceps.  He has adequate strength with elbow extension.  Right second toe exam shows a fixed hammertoe deformity with a thick callus at the tip.  No evidence of infection.  The callus is tender to palpation. Specialty Comments:  No specialty comments available.  Imaging: Xr Elbow 2 Views Right  Result Date: 05/30/2019 No acute or structural abnormalities.    PMFS History: Patient Active Problem List   Diagnosis Date Noted  . Pain in right elbow 05/30/2019  . Osteomyelitis of second toe of right foot ([place]) 04/03/2019  . Hammertoe of second toe of right foot 04/03/2019  . Ulcer of second toe of right foot ([place]) 03/21/2019  . Osteomyelitis of great toe of right foot ([place]) 12/05/2018  . Sacral insufficiency fracture 07/04/2018  . Unsteady gait 07/04/2018  . Mild cognitive impairment 07/04/2018  . Neuropathy 07/04/2018  . CAD (coronary artery disease) 05/29/2018  . AVM (arteriovenous malformation) of colon   . Radiation proctitis   . Amputation of right great toe ([place]) 03/01/2018  . Diverticulosis of colon with hemorrhage    . Acute blood loss anemia   . History of back surgery 10/03/2017  . Rectal bleeding 10/03/2017  . Hematochezia   . Lower GI bleeding 09/29/2017  . Treatment-emergent central sleep apnea 06/25/2017  . Chronic bilateral low back pain with bilateral sciatica 01/17/2017  . Post laminectomy syndrome 01/17/2017  . Chronic pain syndrome 01/17/2017  . Sacroiliitis ([place]) 01/17/2017  . Piriformis syndrome of right side 01/17/2017  . Malignant neoplasm of prostate ([place]) 07/19/2016  . Fixation hardware in spine 05/15/2016  . Hypoglycemic reaction 02/02/2015  . Tremor, essential 12/11/2014  . OSA on CPAP 04/24/2014  . 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 Hypersomnia with sleep apnea, unspecified 04/24/2014  . Depression 04/18/2013  . BPH (benign prostatic hyperplasia) 04/18/2013  . Impingement syndrome of right shoulder 04/14/2013    Class: Acute  . Scoliosis or kyphoscoliosis 04/08/2013    Class: Chronic  . Lumbosacral spondylosis without myelopathy 04/08/2013    Class: Chronic  . Acute respiratory failure ([place]) 04/08/2013  . Essential hypertension 04/08/2013  . Lymphocytosis   . Diaphragmatic hernia 11/21/2010  . DIVERTICULAR DISEASE 11/21/2010  . BARRETT'S ESOPHAGUS 02/22/2010  . GERD 07/20/2008  . INGUINAL HERNIA, LEFT, SMALL 07/20/2008  . OTH VENTRAL HERN W/O MENTION OBSTRUCTION/GANGREN 07/20/2008  . CONSTIPATION 07/20/2008  . DYSPHAGIA 07/20/2008   Past Medical History:  Diagnosis Date  . Anemia   . Arthritis   . AVM (arteriovenous malformation)   . Barrett's esophagus   . BPH (benign prostatic hyperplasia)   . Cataract   . Constipation   . CPAP (continuous positive airway pressure) dependence   . Depression   . Diverticulosis    diverticular bleeding  . Diverticulosis   . GERD (gastroesophageal reflux disease)   . History of radiation therapy   . HOH (hard of hearing)    wears hearing aids  . Hyperlipidemia   . Hypoglycemia   . Lymphocytosis 06/2012  .  Mastodynia   . Murmur    slight  . Nasal congestion   . Osteoporosis   . Osteoporosis   . Prostate cancer ([place])   . Sleep apnea    wears CPAP  . Urinary retention with incomplete bladder emptying    pt uses bathroom and within 30 mins needs to go again  . Vitamin D deficiency     Family History  Problem Relation Age of Onset  . Prostate cancer Father   . CAD Mother   . Arthritis-Osteo Brother   . Colon cancer Neg Hx   . Stomach cancer Neg Hx     Past Surgical  History:  Procedure Laterality Date  . AMPUTATION TOE Right 09/20/2017   Procedure: Right 4th toe amputation;  Surgeon: Wylene Simmer, MD;  Location: [facility];  Service: Orthopedics;  Laterality: Right;  foot block  . AMPUTATION TOE Right 02/05/2019   Procedure: RIGHT GREAT TOE AMPUTATION;  Surgeon: Leandrew Koyanagi, MD;  Location: [facility];  Service: Orthopedics;  Laterality: Right;  . BACK SURGERY  04/2013  . back surgury     f  . BLEPHAROPLASTY Bilateral 02/23/15  . CATARACT EXTRACTION    . COLONOSCOPY    . COLONOSCOPY N/A 10/02/2017   Procedure: COLONOSCOPY;  Surgeon: Doran Stabler, MD;  Location[facility];  Service: Gastroenterology;  Laterality: N/A;  . COLONOSCOPY WITH PROPOFOL N/A 10/01/2017   Procedure: COLONOSCOPY WITH PROPOFOL;  Surgeon: Doran Stabler, MD;  [facility];  Service: Gastroenterology;  Laterality: N/A;  . COLONOSCOPY WITH PROPOFOL N/A 03/07/2018   Procedure: COLONOSCOPY WITH PROPOFOL;  Surgeon: [name], MD;  [facility];  Service: Endoscopy;  Laterality: N/A;  . ESOPHAGOGASTRODUODENOSCOPY (EGD) WITH PROPOFOL N/A 10/01/2017   Procedure: ESOPHAGOGASTRODUODENOSCOPY (EGD) WITH PROPOFOL;  Surgeon: Doran Stabler, MD;  [facility];  Service: Gastroenterology;  Laterality: N/A;  . EYE SURGERY Bilateral   . FINGER SURGERY Right    right thumb  . HARDWARE REMOVAL Right 05/15/2016   Procedure: Right Lumbar Two-Lumbar  Five Removal of Hardware;  Surgeon: Kristeen Miss, MD;  Location: [facility];  Service: Neurosurgery;  Laterality: Right;  Right L2-5 removal of hardware  . INGUINAL HERNIA REPAIR     x4  . IR RADIOLOGIST EVAL & MGMT  07/10/2018  . IR RADIOLOGIST EVAL & MGMT  09/05/2018  . IR SACROPLASTY BILATERAL  07/15/2018  . REPAIR SPIGELIAN HERNIA  2011  . [place] ,2014  . TONSILLECTOMY    . UPPER GASTROINTESTINAL ENDOSCOPY    . VASECTOMY     Social History   Occupational History  . 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 Smoking status: Never Smoker  . Smokeless tobacco: Never Used  Substance and Sexual Activity  . Alcohol use: No  . Drug use: No  . Sexual activity: Never

## 2019-05-31 DIAGNOSIS — M545 Low back pain: Secondary | ICD-10-CM | POA: Diagnosis not present

## 2019-05-31 DIAGNOSIS — R2681 Unsteadiness on feet: Secondary | ICD-10-CM | POA: Diagnosis not present

## 2019-05-31 DIAGNOSIS — M6281 Muscle weakness (generalized): Secondary | ICD-10-CM | POA: Diagnosis not present

## 2019-05-31 DIAGNOSIS — R2689 Other abnormalities of gait and mobility: Secondary | ICD-10-CM | POA: Diagnosis not present

## 2019-05-31 DIAGNOSIS — R293 Abnormal posture: Secondary | ICD-10-CM | POA: Diagnosis not present

## 2019-06-02 DIAGNOSIS — M6281 Muscle weakness (generalized): Secondary | ICD-10-CM | POA: Diagnosis not present

## 2019-06-02 DIAGNOSIS — R2689 Other abnormalities of gait and mobility: Secondary | ICD-10-CM | POA: Diagnosis not present

## 2019-06-02 DIAGNOSIS — R293 Abnormal posture: Secondary | ICD-10-CM | POA: Diagnosis not present

## 2019-06-02 DIAGNOSIS — M545 Low back pain: Secondary | ICD-10-CM | POA: Diagnosis not present

## 2019-06-02 DIAGNOSIS — R2681 Unsteadiness on feet: Secondary | ICD-10-CM | POA: Diagnosis not present

## 2019-06-04 DIAGNOSIS — R2681 Unsteadiness on feet: Secondary | ICD-10-CM | POA: Diagnosis not present

## 2019-06-04 DIAGNOSIS — R41841 Cognitive communication deficit: Secondary | ICD-10-CM | POA: Diagnosis not present

## 2019-06-04 DIAGNOSIS — R2689 Other abnormalities of gait and mobility: Secondary | ICD-10-CM | POA: Diagnosis not present

## 2019-06-04 DIAGNOSIS — R293 Abnormal posture: Secondary | ICD-10-CM | POA: Diagnosis not present

## 2019-06-04 DIAGNOSIS — M6281 Muscle weakness (generalized): Secondary | ICD-10-CM | POA: Diagnosis not present

## 2019-06-04 DIAGNOSIS — M545 Low back pain: Secondary | ICD-10-CM | POA: Diagnosis not present

## 2019-06-06 DIAGNOSIS — R2681 Unsteadiness on feet: Secondary | ICD-10-CM | POA: Diagnosis not present

## 2019-06-06 DIAGNOSIS — M6281 Muscle weakness (generalized): Secondary | ICD-10-CM | POA: Diagnosis not present

## 2019-06-06 DIAGNOSIS — R293 Abnormal posture: Secondary | ICD-10-CM | POA: Diagnosis not present

## 2019-06-06 DIAGNOSIS — M545 Low back pain: Secondary | ICD-10-CM | POA: Diagnosis not present

## 2019-06-06 DIAGNOSIS — R41841 Cognitive communication deficit: Secondary | ICD-10-CM | POA: Diagnosis not present

## 2019-06-06 DIAGNOSIS — R2689 Other abnormalities of gait and mobility: Secondary | ICD-10-CM | POA: Diagnosis not present

## 2019-06-09 DIAGNOSIS — R293 Abnormal posture: Secondary | ICD-10-CM | POA: Diagnosis not present

## 2019-06-09 DIAGNOSIS — R2681 Unsteadiness on feet: Secondary | ICD-10-CM | POA: Diagnosis not present

## 2019-06-09 DIAGNOSIS — R2689 Other abnormalities of gait and mobility: Secondary | ICD-10-CM | POA: Diagnosis not present

## 2019-06-09 DIAGNOSIS — M6281 Muscle weakness (generalized): Secondary | ICD-10-CM | POA: Diagnosis not present

## 2019-06-09 DIAGNOSIS — M545 Low back pain: Secondary | ICD-10-CM | POA: Diagnosis not present

## 2019-06-09 DIAGNOSIS — R41841 Cognitive communication deficit: Secondary | ICD-10-CM | POA: Diagnosis not present

## 2019-06-10 DIAGNOSIS — N319 Neuromuscular dysfunction of bladder, unspecified: Secondary | ICD-10-CM | POA: Diagnosis not present

## 2019-06-10 DIAGNOSIS — R739 Hyperglycemia, unspecified: Secondary | ICD-10-CM | POA: Diagnosis not present

## 2019-06-10 DIAGNOSIS — Z89411 Acquired absence of right great toe: Secondary | ICD-10-CM | POA: Diagnosis not present

## 2019-06-10 DIAGNOSIS — G4733 Obstructive sleep apnea (adult) (pediatric): Secondary | ICD-10-CM | POA: Diagnosis not present

## 2019-06-10 DIAGNOSIS — M810 Age-related osteoporosis without current pathological fracture: Secondary | ICD-10-CM | POA: Diagnosis not present

## 2019-06-10 DIAGNOSIS — F329 Major depressive disorder, single episode, unspecified: Secondary | ICD-10-CM | POA: Diagnosis not present

## 2019-06-10 DIAGNOSIS — M545 Low back pain: Secondary | ICD-10-CM | POA: Diagnosis not present

## 2019-06-10 DIAGNOSIS — E785 Hyperlipidemia, unspecified: Secondary | ICD-10-CM | POA: Diagnosis not present

## 2019-06-10 DIAGNOSIS — Z0000 Encounter for general adult medical examination without abnormal findings: Secondary | ICD-10-CM | POA: Diagnosis not present

## 2019-06-10 DIAGNOSIS — R2681 Unsteadiness on feet: Secondary | ICD-10-CM | POA: Diagnosis not present

## 2019-06-10 DIAGNOSIS — I2510 Atherosclerotic heart disease of native coronary artery without angina pectoris: Secondary | ICD-10-CM | POA: Diagnosis not present

## 2019-06-10 DIAGNOSIS — C61 Malignant neoplasm of prostate: Secondary | ICD-10-CM | POA: Diagnosis not present

## 2019-06-11 DIAGNOSIS — R41841 Cognitive communication deficit: Secondary | ICD-10-CM | POA: Diagnosis not present

## 2019-06-11 DIAGNOSIS — R293 Abnormal posture: Secondary | ICD-10-CM | POA: Diagnosis not present

## 2019-06-11 DIAGNOSIS — R2681 Unsteadiness on feet: Secondary | ICD-10-CM | POA: Diagnosis not present

## 2019-06-11 DIAGNOSIS — M545 Low back pain: Secondary | ICD-10-CM | POA: Diagnosis not present

## 2019-06-11 DIAGNOSIS — R2689 Other abnormalities of gait and mobility: Secondary | ICD-10-CM | POA: Diagnosis not present

## 2019-06-11 DIAGNOSIS — M6281 Muscle weakness (generalized): Secondary | ICD-10-CM | POA: Diagnosis not present

## 2019-06-13 DIAGNOSIS — R2681 Unsteadiness on feet: Secondary | ICD-10-CM | POA: Diagnosis not present

## 2019-06-13 DIAGNOSIS — M545 Low back pain: Secondary | ICD-10-CM | POA: Diagnosis not present

## 2019-06-13 DIAGNOSIS — R41841 Cognitive communication deficit: Secondary | ICD-10-CM | POA: Diagnosis not present

## 2019-06-13 DIAGNOSIS — M6281 Muscle weakness (generalized): Secondary | ICD-10-CM | POA: Diagnosis not present

## 2019-06-13 DIAGNOSIS — R2689 Other abnormalities of gait and mobility: Secondary | ICD-10-CM | POA: Diagnosis not present

## 2019-06-13 DIAGNOSIS — R293 Abnormal posture: Secondary | ICD-10-CM | POA: Diagnosis not present

## 2019-06-16 DIAGNOSIS — R2689 Other abnormalities of gait and mobility: Secondary | ICD-10-CM | POA: Diagnosis not present

## 2019-06-16 DIAGNOSIS — M6281 Muscle weakness (generalized): Secondary | ICD-10-CM | POA: Diagnosis not present

## 2019-06-16 DIAGNOSIS — R2681 Unsteadiness on feet: Secondary | ICD-10-CM | POA: Diagnosis not present

## 2019-06-16 DIAGNOSIS — R293 Abnormal posture: Secondary | ICD-10-CM | POA: Diagnosis not present

## 2019-06-16 DIAGNOSIS — M545 Low back pain: Secondary | ICD-10-CM | POA: Diagnosis not present

## 2019-06-16 DIAGNOSIS — R41841 Cognitive communication deficit: Secondary | ICD-10-CM | POA: Diagnosis not present

## 2019-06-18 ENCOUNTER — Other Ambulatory Visit: Payer: Medicare Other

## 2019-06-18 DIAGNOSIS — R2689 Other abnormalities of gait and mobility: Secondary | ICD-10-CM | POA: Diagnosis not present

## 2019-06-18 DIAGNOSIS — R293 Abnormal posture: Secondary | ICD-10-CM | POA: Diagnosis not present

## 2019-06-18 DIAGNOSIS — R2681 Unsteadiness on feet: Secondary | ICD-10-CM | POA: Diagnosis not present

## 2019-06-18 DIAGNOSIS — M6281 Muscle weakness (generalized): Secondary | ICD-10-CM | POA: Diagnosis not present

## 2019-06-18 DIAGNOSIS — R41841 Cognitive communication deficit: Secondary | ICD-10-CM | POA: Diagnosis not present

## 2019-06-18 DIAGNOSIS — M545 Low back pain: Secondary | ICD-10-CM | POA: Diagnosis not present

## 2019-06-20 DIAGNOSIS — M6281 Muscle weakness (generalized): Secondary | ICD-10-CM | POA: Diagnosis not present

## 2019-06-20 DIAGNOSIS — M545 Low back pain: Secondary | ICD-10-CM | POA: Diagnosis not present

## 2019-06-20 DIAGNOSIS — R41841 Cognitive communication deficit: Secondary | ICD-10-CM | POA: Diagnosis not present

## 2019-06-20 DIAGNOSIS — R2681 Unsteadiness on feet: Secondary | ICD-10-CM | POA: Diagnosis not present

## 2019-06-20 DIAGNOSIS — R293 Abnormal posture: Secondary | ICD-10-CM | POA: Diagnosis not present

## 2019-06-20 DIAGNOSIS — R2689 Other abnormalities of gait and mobility: Secondary | ICD-10-CM | POA: Diagnosis not present

## 2019-06-24 DIAGNOSIS — R3914 Feeling of incomplete bladder emptying: Secondary | ICD-10-CM | POA: Diagnosis not present

## 2019-06-25 ENCOUNTER — Other Ambulatory Visit: Payer: Medicare Other

## 2019-06-26 DIAGNOSIS — M6281 Muscle weakness (generalized): Secondary | ICD-10-CM | POA: Diagnosis not present

## 2019-06-26 DIAGNOSIS — R41841 Cognitive communication deficit: Secondary | ICD-10-CM | POA: Diagnosis not present

## 2019-06-26 DIAGNOSIS — R2681 Unsteadiness on feet: Secondary | ICD-10-CM | POA: Diagnosis not present

## 2019-06-26 DIAGNOSIS — R2689 Other abnormalities of gait and mobility: Secondary | ICD-10-CM | POA: Diagnosis not present

## 2019-06-26 DIAGNOSIS — R293 Abnormal posture: Secondary | ICD-10-CM | POA: Diagnosis not present

## 2019-06-26 DIAGNOSIS — M545 Low back pain: Secondary | ICD-10-CM | POA: Diagnosis not present

## 2019-06-30 ENCOUNTER — Other Ambulatory Visit: Payer: Medicare Other

## 2019-07-03 DIAGNOSIS — R41841 Cognitive communication deficit: Secondary | ICD-10-CM | POA: Diagnosis not present

## 2019-07-03 DIAGNOSIS — H524 Presbyopia: Secondary | ICD-10-CM | POA: Diagnosis not present

## 2019-07-03 DIAGNOSIS — H35373 Puckering of macula, bilateral: Secondary | ICD-10-CM | POA: Diagnosis not present

## 2019-07-03 DIAGNOSIS — R2689 Other abnormalities of gait and mobility: Secondary | ICD-10-CM | POA: Diagnosis not present

## 2019-07-03 DIAGNOSIS — R2681 Unsteadiness on feet: Secondary | ICD-10-CM | POA: Diagnosis not present

## 2019-07-03 DIAGNOSIS — M6281 Muscle weakness (generalized): Secondary | ICD-10-CM | POA: Diagnosis not present

## 2019-07-03 DIAGNOSIS — R293 Abnormal posture: Secondary | ICD-10-CM | POA: Diagnosis not present

## 2019-07-03 DIAGNOSIS — M545 Low back pain: Secondary | ICD-10-CM | POA: Diagnosis not present

## 2019-07-04 DIAGNOSIS — R2689 Other abnormalities of gait and mobility: Secondary | ICD-10-CM | POA: Diagnosis not present

## 2019-07-04 DIAGNOSIS — R2681 Unsteadiness on feet: Secondary | ICD-10-CM | POA: Diagnosis not present

## 2019-07-04 DIAGNOSIS — M6281 Muscle weakness (generalized): Secondary | ICD-10-CM | POA: Diagnosis not present

## 2019-07-04 DIAGNOSIS — R41841 Cognitive communication deficit: Secondary | ICD-10-CM | POA: Diagnosis not present

## 2019-07-04 DIAGNOSIS — R293 Abnormal posture: Secondary | ICD-10-CM | POA: Diagnosis not present

## 2019-07-04 DIAGNOSIS — M545 Low back pain: Secondary | ICD-10-CM | POA: Diagnosis not present

## 2019-07-07 DIAGNOSIS — M6281 Muscle weakness (generalized): Secondary | ICD-10-CM | POA: Diagnosis not present

## 2019-07-07 DIAGNOSIS — R41841 Cognitive communication deficit: Secondary | ICD-10-CM | POA: Diagnosis not present

## 2019-07-07 DIAGNOSIS — R293 Abnormal posture: Secondary | ICD-10-CM | POA: Diagnosis not present

## 2019-07-07 DIAGNOSIS — M545 Low back pain: Secondary | ICD-10-CM | POA: Diagnosis not present

## 2019-07-08 DIAGNOSIS — M6281 Muscle weakness (generalized): Secondary | ICD-10-CM | POA: Diagnosis not present

## 2019-07-08 DIAGNOSIS — R293 Abnormal posture: Secondary | ICD-10-CM | POA: Diagnosis not present

## 2019-07-08 DIAGNOSIS — M545 Low back pain: Secondary | ICD-10-CM | POA: Diagnosis not present

## 2019-07-08 DIAGNOSIS — R41841 Cognitive communication deficit: Secondary | ICD-10-CM | POA: Diagnosis not present

## 2019-07-10 ENCOUNTER — Ambulatory Visit: Payer: Medicare Other | Admitting: Specialist

## 2019-07-11 DIAGNOSIS — M545 Low back pain: Secondary | ICD-10-CM | POA: Diagnosis not present

## 2019-07-11 DIAGNOSIS — R293 Abnormal posture: Secondary | ICD-10-CM | POA: Diagnosis not present

## 2019-07-11 DIAGNOSIS — M6281 Muscle weakness (generalized): Secondary | ICD-10-CM | POA: Diagnosis not present

## 2019-07-11 DIAGNOSIS — R41841 Cognitive communication deficit: Secondary | ICD-10-CM | POA: Diagnosis not present

## 2019-07-15 DIAGNOSIS — M6281 Muscle weakness (generalized): Secondary | ICD-10-CM | POA: Diagnosis not present

## 2019-07-15 DIAGNOSIS — R293 Abnormal posture: Secondary | ICD-10-CM | POA: Diagnosis not present

## 2019-07-15 DIAGNOSIS — M545 Low back pain: Secondary | ICD-10-CM | POA: Diagnosis not present

## 2019-07-15 DIAGNOSIS — R41841 Cognitive communication deficit: Secondary | ICD-10-CM | POA: Diagnosis not present

## 2019-07-17 DIAGNOSIS — M6281 Muscle weakness (generalized): Secondary | ICD-10-CM | POA: Diagnosis not present

## 2019-07-17 DIAGNOSIS — M545 Low back pain: Secondary | ICD-10-CM | POA: Diagnosis not present

## 2019-07-17 DIAGNOSIS — R293 Abnormal posture: Secondary | ICD-10-CM | POA: Diagnosis not present

## 2019-07-17 DIAGNOSIS — R41841 Cognitive communication deficit: Secondary | ICD-10-CM | POA: Diagnosis not present

## 2019-07-22 DIAGNOSIS — M6281 Muscle weakness (generalized): Secondary | ICD-10-CM | POA: Diagnosis not present

## 2019-07-22 DIAGNOSIS — M545 Low back pain: Secondary | ICD-10-CM | POA: Diagnosis not present

## 2019-07-22 DIAGNOSIS — R293 Abnormal posture: Secondary | ICD-10-CM | POA: Diagnosis not present

## 2019-07-22 DIAGNOSIS — R41841 Cognitive communication deficit: Secondary | ICD-10-CM | POA: Diagnosis not present

## 2019-07-23 DIAGNOSIS — R3914 Feeling of incomplete bladder emptying: Secondary | ICD-10-CM | POA: Diagnosis not present

## 2019-07-25 DIAGNOSIS — R41841 Cognitive communication deficit: Secondary | ICD-10-CM | POA: Diagnosis not present

## 2019-07-25 DIAGNOSIS — M545 Low back pain: Secondary | ICD-10-CM | POA: Diagnosis not present

## 2019-07-25 DIAGNOSIS — M6281 Muscle weakness (generalized): Secondary | ICD-10-CM | POA: Diagnosis not present

## 2019-07-25 DIAGNOSIS — R293 Abnormal posture: Secondary | ICD-10-CM | POA: Diagnosis not present

## 2019-07-29 DIAGNOSIS — M545 Low back pain: Secondary | ICD-10-CM | POA: Diagnosis not present

## 2019-07-29 DIAGNOSIS — R41841 Cognitive communication deficit: Secondary | ICD-10-CM | POA: Diagnosis not present

## 2019-07-29 DIAGNOSIS — R293 Abnormal posture: Secondary | ICD-10-CM | POA: Diagnosis not present

## 2019-07-29 DIAGNOSIS — M6281 Muscle weakness (generalized): Secondary | ICD-10-CM | POA: Diagnosis not present

## 2019-07-30 DIAGNOSIS — R41841 Cognitive communication deficit: Secondary | ICD-10-CM | POA: Diagnosis not present

## 2019-07-30 DIAGNOSIS — M545 Low back pain: Secondary | ICD-10-CM | POA: Diagnosis not present

## 2019-07-30 DIAGNOSIS — R293 Abnormal posture: Secondary | ICD-10-CM | POA: Diagnosis not present

## 2019-07-30 DIAGNOSIS — M6281 Muscle weakness (generalized): Secondary | ICD-10-CM | POA: Diagnosis not present

## 2019-08-01 DIAGNOSIS — R41841 Cognitive communication deficit: Secondary | ICD-10-CM | POA: Diagnosis not present

## 2019-08-01 DIAGNOSIS — M545 Low back pain: Secondary | ICD-10-CM | POA: Diagnosis not present

## 2019-08-01 DIAGNOSIS — M6281 Muscle weakness (generalized): Secondary | ICD-10-CM | POA: Diagnosis not present

## 2019-08-01 DIAGNOSIS — R293 Abnormal posture: Secondary | ICD-10-CM | POA: Diagnosis not present

## 2019-08-05 DIAGNOSIS — M545 Low back pain: Secondary | ICD-10-CM | POA: Diagnosis not present

## 2019-08-05 DIAGNOSIS — M6281 Muscle weakness (generalized): Secondary | ICD-10-CM | POA: Diagnosis not present

## 2019-08-05 DIAGNOSIS — R41841 Cognitive communication deficit: Secondary | ICD-10-CM | POA: Diagnosis not present

## 2019-08-06 DIAGNOSIS — M6281 Muscle weakness (generalized): Secondary | ICD-10-CM | POA: Diagnosis not present

## 2019-08-06 DIAGNOSIS — M545 Low back pain: Secondary | ICD-10-CM | POA: Diagnosis not present

## 2019-08-06 DIAGNOSIS — R41841 Cognitive communication deficit: Secondary | ICD-10-CM | POA: Diagnosis not present

## 2019-08-07 DIAGNOSIS — M6281 Muscle weakness (generalized): Secondary | ICD-10-CM | POA: Diagnosis not present

## 2019-08-07 DIAGNOSIS — R41841 Cognitive communication deficit: Secondary | ICD-10-CM | POA: Diagnosis not present

## 2019-08-07 DIAGNOSIS — M545 Low back pain: Secondary | ICD-10-CM | POA: Diagnosis not present

## 2019-08-08 DIAGNOSIS — R41841 Cognitive communication deficit: Secondary | ICD-10-CM | POA: Diagnosis not present

## 2019-08-08 DIAGNOSIS — M545 Low back pain: Secondary | ICD-10-CM | POA: Diagnosis not present

## 2019-08-08 DIAGNOSIS — M6281 Muscle weakness (generalized): Secondary | ICD-10-CM | POA: Diagnosis not present

## 2019-08-12 DIAGNOSIS — R41841 Cognitive communication deficit: Secondary | ICD-10-CM | POA: Diagnosis not present

## 2019-08-12 DIAGNOSIS — M6281 Muscle weakness (generalized): Secondary | ICD-10-CM | POA: Diagnosis not present

## 2019-08-12 DIAGNOSIS — M545 Low back pain: Secondary | ICD-10-CM | POA: Diagnosis not present

## 2019-08-14 DIAGNOSIS — M6281 Muscle weakness (generalized): Secondary | ICD-10-CM | POA: Diagnosis not present

## 2019-08-14 DIAGNOSIS — M545 Low back pain: Secondary | ICD-10-CM | POA: Diagnosis not present

## 2019-08-14 DIAGNOSIS — R41841 Cognitive communication deficit: Secondary | ICD-10-CM | POA: Diagnosis not present

## 2019-08-15 DIAGNOSIS — R41841 Cognitive communication deficit: Secondary | ICD-10-CM | POA: Diagnosis not present

## 2019-08-15 DIAGNOSIS — M545 Low back pain: Secondary | ICD-10-CM | POA: Diagnosis not present

## 2019-08-15 DIAGNOSIS — M6281 Muscle weakness (generalized): Secondary | ICD-10-CM | POA: Diagnosis not present

## 2019-08-19 DIAGNOSIS — M545 Low back pain: Secondary | ICD-10-CM | POA: Diagnosis not present

## 2019-08-19 DIAGNOSIS — M6281 Muscle weakness (generalized): Secondary | ICD-10-CM | POA: Diagnosis not present

## 2019-08-19 DIAGNOSIS — R41841 Cognitive communication deficit: Secondary | ICD-10-CM | POA: Diagnosis not present

## 2019-08-20 DIAGNOSIS — R3914 Feeling of incomplete bladder emptying: Secondary | ICD-10-CM | POA: Diagnosis not present

## 2019-08-21 DIAGNOSIS — M6281 Muscle weakness (generalized): Secondary | ICD-10-CM | POA: Diagnosis not present

## 2019-08-21 DIAGNOSIS — M545 Low back pain: Secondary | ICD-10-CM | POA: Diagnosis not present

## 2019-08-21 DIAGNOSIS — R41841 Cognitive communication deficit: Secondary | ICD-10-CM | POA: Diagnosis not present

## 2019-08-22 DIAGNOSIS — R41841 Cognitive communication deficit: Secondary | ICD-10-CM | POA: Diagnosis not present

## 2019-08-22 DIAGNOSIS — M545 Low back pain: Secondary | ICD-10-CM | POA: Diagnosis not present

## 2019-08-22 DIAGNOSIS — M6281 Muscle weakness (generalized): Secondary | ICD-10-CM | POA: Diagnosis not present

## 2019-08-28 DIAGNOSIS — R41841 Cognitive communication deficit: Secondary | ICD-10-CM | POA: Diagnosis not present

## 2019-08-28 DIAGNOSIS — M6281 Muscle weakness (generalized): Secondary | ICD-10-CM | POA: Diagnosis not present

## 2019-08-28 DIAGNOSIS — M545 Low back pain: Secondary | ICD-10-CM | POA: Diagnosis not present

## 2019-09-02 DIAGNOSIS — M6281 Muscle weakness (generalized): Secondary | ICD-10-CM | POA: Diagnosis not present

## 2019-09-02 DIAGNOSIS — M545 Low back pain: Secondary | ICD-10-CM | POA: Diagnosis not present

## 2019-09-02 DIAGNOSIS — R41841 Cognitive communication deficit: Secondary | ICD-10-CM | POA: Diagnosis not present

## 2019-09-04 DIAGNOSIS — R41841 Cognitive communication deficit: Secondary | ICD-10-CM | POA: Diagnosis not present

## 2019-09-04 DIAGNOSIS — M545 Low back pain: Secondary | ICD-10-CM | POA: Diagnosis not present

## 2019-09-04 DIAGNOSIS — M6281 Muscle weakness (generalized): Secondary | ICD-10-CM | POA: Diagnosis not present

## 2019-09-04 DIAGNOSIS — R293 Abnormal posture: Secondary | ICD-10-CM | POA: Diagnosis not present

## 2019-09-05 ENCOUNTER — Other Ambulatory Visit: Payer: Self-pay

## 2019-09-05 ENCOUNTER — Ambulatory Visit
Admission: RE | Admit: 2019-09-05 | Discharge: 2019-09-05 | Disposition: A | Discharge status: Home / Self Care | Payer: Medicare Other | Source: Ambulatory Visit | Attending: Specialist | Admitting: Specialist

## 2019-09-05 DIAGNOSIS — M545 Low back pain: Secondary | ICD-10-CM | POA: Diagnosis not present

## 2019-09-05 DIAGNOSIS — R41841 Cognitive communication deficit: Secondary | ICD-10-CM | POA: Diagnosis not present

## 2019-09-05 DIAGNOSIS — R293 Abnormal posture: Secondary | ICD-10-CM | POA: Diagnosis not present

## 2019-09-05 DIAGNOSIS — M6281 Muscle weakness (generalized): Secondary | ICD-10-CM | POA: Diagnosis not present

## 2019-09-05 DIAGNOSIS — M810 Age-related osteoporosis without current pathological fracture: Secondary | ICD-10-CM

## 2019-09-05 DIAGNOSIS — Z13820 Encounter for screening for osteoporosis: Secondary | ICD-10-CM | POA: Diagnosis not present

## 2019-09-09 DIAGNOSIS — M6281 Muscle weakness (generalized): Secondary | ICD-10-CM | POA: Diagnosis not present

## 2019-09-09 DIAGNOSIS — R41841 Cognitive communication deficit: Secondary | ICD-10-CM | POA: Diagnosis not present

## 2019-09-09 DIAGNOSIS — R293 Abnormal posture: Secondary | ICD-10-CM | POA: Diagnosis not present

## 2019-09-09 DIAGNOSIS — M545 Low back pain: Secondary | ICD-10-CM | POA: Diagnosis not present

## 2019-09-10 DIAGNOSIS — M545 Low back pain: Secondary | ICD-10-CM | POA: Diagnosis not present

## 2019-09-10 DIAGNOSIS — R293 Abnormal posture: Secondary | ICD-10-CM | POA: Diagnosis not present

## 2019-09-10 DIAGNOSIS — R41841 Cognitive communication deficit: Secondary | ICD-10-CM | POA: Diagnosis not present

## 2019-09-10 DIAGNOSIS — M6281 Muscle weakness (generalized): Secondary | ICD-10-CM | POA: Diagnosis not present

## 2019-09-11 ENCOUNTER — Ambulatory Visit (INDEPENDENT_AMBULATORY_CARE_PROVIDER_SITE_OTHER): Payer: Medicare Other | Admitting: Specialist

## 2019-09-11 ENCOUNTER — Encounter: Payer: Self-pay | Admitting: Specialist

## 2019-09-11 VITALS — BP 131/70 | HR 56 | Ht 66.0 in | Wt 169.0 lb

## 2019-09-11 DIAGNOSIS — M4125 Other idiopathic scoliosis, thoracolumbar region: Secondary | ICD-10-CM

## 2019-09-11 DIAGNOSIS — M4015 Other secondary kyphosis, thoracolumbar region: Secondary | ICD-10-CM | POA: Diagnosis not present

## 2019-09-11 NOTE — Progress Notes (Signed)
Office Visit Note   Patient: Charles [name]           Date of Birth: 17-Jun-1931           MRN: [id number] Visit Date: 09/11/2019              Requested by: Leanna Battles, MD [place],  [place] [id number] PCP: Leanna Battles, MD   Assessment & Plan: Visit Diagnoses:  1. Other secondary kyphosis, thoracolumbar region   2. Other idiopathic scoliosis, thoracolumbar region     Plan: Avoid frequent bending and stooping  No lifting greater than 10 lbs. May use ice or moist heat for pain. Weight loss is of benefit. Referral to [facility], Dr. Harl Bowie, Proximal Junctional Kyphosis Above L1 to S1 fusion, Needs a subtraction osteotomy to improve quality of life and posture.  Exercise is important to improve your indurance and does allow people to function better inspite of back pain.    Follow-Up Instructions: No follow-ups on file.   Orders:  No orders of the defined types were placed in this encounter.  No orders of the defined types were placed in this encounter.     Procedures: No procedures performed   Clinical Data: No additional findings.   Subjective: Chief Complaint  Patient presents with  . Lower Back - Follow-up    85 year old male with history of back pain with radiation into the right SI joint and into the right hip joint. Tylenol without help, tramadol is not getting the job done. He take hydrocodone one tablet couple of times two tablets once a day.He is concerned about being stooped over and unable to stand upright due to stoop posture.   Review of Systems  Constitutional: Negative.   HENT: Negative.   Eyes: Negative.   Respiratory: Negative.   Cardiovascular: Negative.   Gastrointestinal: Negative.   Endocrine: Negative.   Genitourinary: Negative.   Musculoskeletal: Negative.   Skin: Negative.   Allergic/Immunologic: Negative.   Neurological: Negative.   Hematological: Negative.    Psychiatric/Behavioral: Negative.      Objective: Vital Signs: BP 131/70 (BP Location: Left Arm, Patient Position: Sitting)   Pulse (!) 56   Ht 5\' 6"  (1.676 m)   Wt 169 lb (76.7 kg)   BMI 27.28 kg/m   Physical Exam  Back Exam   Tenderness  The patient is experiencing tenderness in the lumbar.  Range of Motion  Extension: abnormal  Flexion: abnormal  Lateral bend right: normal  Lateral bend left: normal  Rotation right: normal  Rotation left: normal   Muscle Strength  Right Quadriceps:  5/5  Left Quadriceps:  5/5  Left Hamstrings:  5/5   Tests  Straight leg raise right: negative Straight leg raise left: negative  Reflexes  Patellar: normal Achilles: normal Babinski's sign: normal   Other  Toe walk: normal Heel walk: normal Sensation: normal Gait: antalgic  Erythema: no back redness Scars: absent      Specialty Comments:  No specialty comments available.  Imaging: No results found.   PMFS History: Patient Active Problem List   Diagnosis Date Noted  . Scoliosis or kyphoscoliosis 04/08/2013    Priority: High    Class: Chronic  . Lumbosacral spondylosis without myelopathy 04/08/2013    Priority: High    Class: Chronic  . Impingement syndrome of right shoulder 04/14/2013    Priority: Medium    Class: Acute  . Pain in right elbow 05/30/2019  .  Osteomyelitis of second toe of right foot ([place]) 04/03/2019  . Hammertoe of second toe of right foot 04/03/2019  . Ulcer of second toe of right foot ([place]) 03/21/2019  . Osteomyelitis of great toe of right foot ([place]) 12/05/2018  . Sacral insufficiency fracture 07/04/2018  . Unsteady gait 07/04/2018  . Mild cognitive impairment 07/04/2018  . Neuropathy 07/04/2018  . CAD (coronary artery disease) 05/29/2018  . AVM (arteriovenous malformation) of colon   . Radiation proctitis   . Amputation of right great toe ([place]) 03/01/2018  . Diverticulosis of colon with hemorrhage   . Acute blood loss anemia   .  History of back surgery 10/03/2017  . Rectal bleeding 10/03/2017  . Hematochezia   . Lower GI bleeding 09/29/2017  . Treatment-emergent central sleep apnea 06/25/2017  . Chronic bilateral low back pain with bilateral sciatica 01/17/2017  . Post laminectomy syndrome 01/17/2017  . Chronic pain syndrome 01/17/2017  . Sacroiliitis ([place]) 01/17/2017  . Piriformis syndrome of right side 01/17/2017  . Malignant neoplasm of prostate ([place]) 07/19/2016  . Fixation hardware in spine 05/15/2016  . Hypoglycemic reaction 02/02/2015  . Tremor, essential 12/11/2014  . OSA on CPAP 04/24/2014  . 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 Hypersomnia with sleep apnea, unspecified 04/24/2014  . Depression 04/18/2013  . BPH (benign prostatic hyperplasia) 04/18/2013  . Acute respiratory failure ([place]) 04/08/2013  . Essential hypertension 04/08/2013  . Lymphocytosis   . Diaphragmatic hernia 11/21/2010  . DIVERTICULAR DISEASE 11/21/2010  . BARRETT'S ESOPHAGUS 02/22/2010  . GERD 07/20/2008  . INGUINAL HERNIA, LEFT, SMALL 07/20/2008  . OTH VENTRAL HERN W/O MENTION OBSTRUCTION/GANGREN 07/20/2008  . CONSTIPATION 07/20/2008  . DYSPHAGIA 07/20/2008   Past Medical History:  Diagnosis Date  . Anemia   . Arthritis   . AVM (arteriovenous malformation)   . Barrett's esophagus   . BPH (benign prostatic hyperplasia)   . Cataract   . Constipation   . CPAP (continuous positive airway pressure) dependence   . Depression   . Diverticulosis    diverticular bleeding  . Diverticulosis   . GERD (gastroesophageal reflux disease)   . History of radiation therapy   . HOH (hard of hearing)    wears hearing aids  . Hyperlipidemia   . Hypoglycemia   . Lymphocytosis 06/2012  . Mastodynia   . Murmur    slight  . Nasal congestion   . Osteoporosis   . Osteoporosis   . Prostate cancer ([place])   . Sleep apnea    wears CPAP  . Urinary retention with incomplete bladder emptying    pt uses bathroom and within 30 mins  needs to go again  . Vitamin D deficiency     Family History  Problem Relation Age of Onset  . Prostate cancer Father   . CAD Mother   . Arthritis-Osteo Brother   . Colon cancer Neg Hx   . Stomach cancer Neg Hx     Past Surgical History:  Procedure Laterality Date  . AMPUTATION TOE Right 09/20/2017   Procedure: Right 4th toe amputation;  Surgeon: Wylene Simmer, MD;  Location: [facility];  Service: Orthopedics;  Laterality: Right;  foot block  . AMPUTATION TOE Right 02/05/2019   Procedure: RIGHT GREAT TOE AMPUTATION;  Surgeon: Leandrew Koyanagi, MD;  Location: [facility];  Service: Orthopedics;  Laterality: Right;  . BACK SURGERY  04/2013  . back surgury     f  . BLEPHAROPLASTY Bilateral 02/23/15  . CATARACT EXTRACTION    .  COLONOSCOPY    . COLONOSCOPY N/A 10/02/2017   Procedure: COLONOSCOPY;  Surgeon: Doran Stabler, MD;  Location: [facility];  Service: Gastroenterology;  Laterality: N/A;  . COLONOSCOPY WITH PROPOFOL N/A 10/01/2017   Procedure: COLONOSCOPY WITH PROPOFOL;  Surgeon: Doran Stabler, MD;  Location: [facility];  Service: Gastroenterology;  Laterality: N/A;  . COLONOSCOPY WITH PROPOFOL N/A 03/07/2018   Procedure: COLONOSCOPY WITH PROPOFOL;  Surgeon: [name], MD;  Location: [facility];  Service: Endoscopy;  Laterality: N/A;  . ESOPHAGOGASTRODUODENOSCOPY (EGD) WITH PROPOFOL N/A 10/01/2017   Procedure: ESOPHAGOGASTRODUODENOSCOPY (EGD) WITH PROPOFOL;  Surgeon: Doran Stabler, MD;  Location: [facility];  Service: Gastroenterology;  Laterality: N/A;  . EYE SURGERY Bilateral   . FINGER SURGERY Right    right thumb  . HARDWARE REMOVAL Right 05/15/2016   Procedure: Right Lumbar Two-Lumbar Five Removal of Hardware;  Surgeon: Kristeen Miss, MD;  Location: [facility];  Service: Neurosurgery;  Laterality: Right;  Right L2-5 removal of hardware  . INGUINAL HERNIA REPAIR     x4  . IR RADIOLOGIST EVAL & MGMT  07/10/2018  . IR  RADIOLOGIST EVAL & MGMT  09/05/2018  . IR SACROPLASTY BILATERAL  07/15/2018  . REPAIR SPIGELIAN HERNIA  2011  . [place] ,2014  . TONSILLECTOMY    . UPPER GASTROINTESTINAL ENDOSCOPY    . VASECTOMY     Social History   Occupational History  . 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 Smoking status: Never Smoker  . Smokeless tobacco: Never Used  Substance and Sexual Activity  . Alcohol use: No  . Drug use: No  . Sexual activity: Never

## 2019-09-11 NOTE — Patient Instructions (Signed)
Plan: Avoid frequent bending and stooping  No lifting greater than 10 lbs. May use ice or moist heat for pain. Weight loss is of benefit. Referral to [facility], Dr. Harl Bowie, Proximal Junctional Kyphosis Above L1 to S1 fusion, Needs a subtraction osteotomy to improve quality of life and posture.  Exercise is important to improve your indurance and does allow people to function better inspite of back pain.

## 2019-09-17 DIAGNOSIS — R3914 Feeling of incomplete bladder emptying: Secondary | ICD-10-CM | POA: Diagnosis not present

## 2019-10-03 DIAGNOSIS — N319 Neuromuscular dysfunction of bladder, unspecified: Secondary | ICD-10-CM | POA: Diagnosis not present

## 2019-10-03 DIAGNOSIS — G4733 Obstructive sleep apnea (adult) (pediatric): Secondary | ICD-10-CM | POA: Diagnosis not present

## 2019-10-03 DIAGNOSIS — M545 Low back pain: Secondary | ICD-10-CM | POA: Diagnosis not present

## 2019-10-03 DIAGNOSIS — Z89411 Acquired absence of right great toe: Secondary | ICD-10-CM | POA: Diagnosis not present

## 2019-10-03 DIAGNOSIS — R2681 Unsteadiness on feet: Secondary | ICD-10-CM | POA: Diagnosis not present

## 2019-10-03 DIAGNOSIS — I2510 Atherosclerotic heart disease of native coronary artery without angina pectoris: Secondary | ICD-10-CM | POA: Diagnosis not present

## 2019-10-03 DIAGNOSIS — F329 Major depressive disorder, single episode, unspecified: Secondary | ICD-10-CM | POA: Diagnosis not present

## 2019-10-10 ENCOUNTER — Telehealth: Payer: Self-pay | Admitting: Specialist

## 2019-10-10 NOTE — Telephone Encounter (Signed)
Pt called,Dr. Louanne Skye referred him to Dr Harl Bowie and he need to get xry on CD. IC patient and he is aware the CD is ready for him. His son or daughter in law will pick up for him

## 2019-10-15 DIAGNOSIS — R3914 Feeling of incomplete bladder emptying: Secondary | ICD-10-CM | POA: Diagnosis not present

## 2019-10-16 DIAGNOSIS — H524 Presbyopia: Secondary | ICD-10-CM | POA: Diagnosis not present

## 2019-10-16 DIAGNOSIS — H35371 Puckering of macula, right eye: Secondary | ICD-10-CM | POA: Diagnosis not present

## 2019-10-16 DIAGNOSIS — H353121 Nonexudative age-related macular degeneration, left eye, early dry stage: Secondary | ICD-10-CM | POA: Diagnosis not present

## 2019-10-23 ENCOUNTER — Ambulatory Visit: Payer: Medicare Other | Admitting: Specialist

## 2019-11-05 DIAGNOSIS — H04123 Dry eye syndrome of bilateral lacrimal glands: Secondary | ICD-10-CM | POA: Diagnosis not present

## 2019-11-05 DIAGNOSIS — H35373 Puckering of macula, bilateral: Secondary | ICD-10-CM | POA: Diagnosis not present

## 2019-11-05 DIAGNOSIS — H43813 Vitreous degeneration, bilateral: Secondary | ICD-10-CM | POA: Diagnosis not present

## 2019-11-05 DIAGNOSIS — H353132 Nonexudative age-related macular degeneration, bilateral, intermediate dry stage: Secondary | ICD-10-CM | POA: Diagnosis not present

## 2019-11-25 IMAGING — CT CT L SPINE W/O CM
3 of 10 series · 12 of 33 positions shown, 15 images · non-contrast
Comparison: 01/10/2017

CLINICAL DATA: Fall 6 weeks ago with negative x-rays. Worsening for
2 days

EXAM:
CT LUMBAR SPINE WITHOUT CONTRAST
TECHNIQUE: Multidetector CT imaging of the lumbar spine was performed without
intravenous contrast administration. Multiplanar CT image
reconstructions were also generated.

[Series 4: l spine st · axial · 0.31mm/px · z∈[+1634,+1820]mm · 6 of 131 slices shown, 8 images]
[im 19/131  soft-tissue]
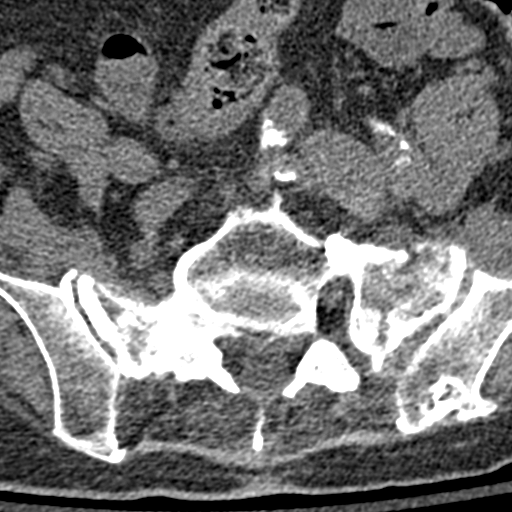
[im 19/131  bone]
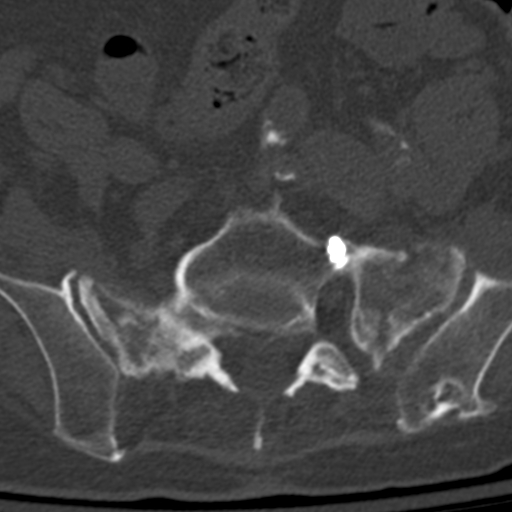
[im 38/131  bone]
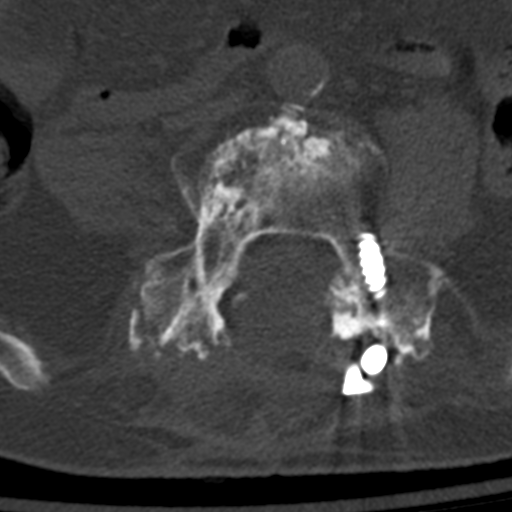
[im 56/131  bone]
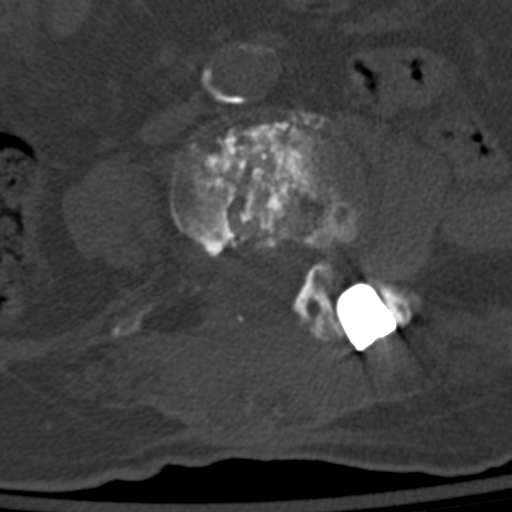
[im 75/131  bone]
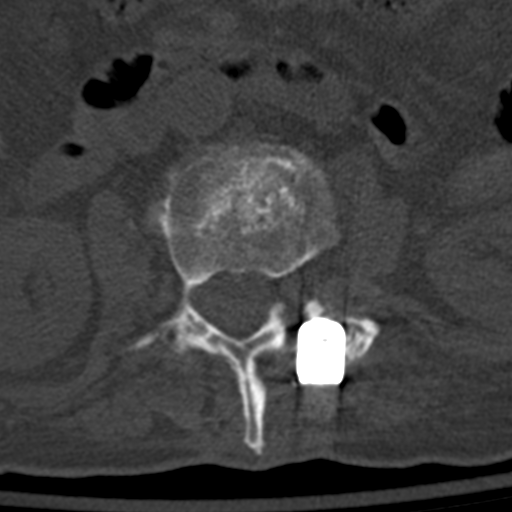
[im 93/131  soft-tissue]
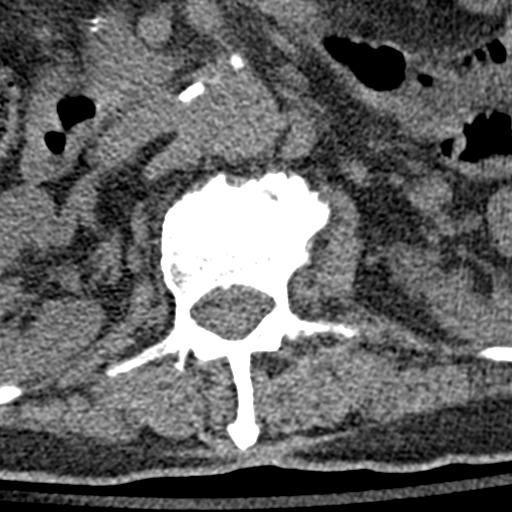
[im 93/131  bone]
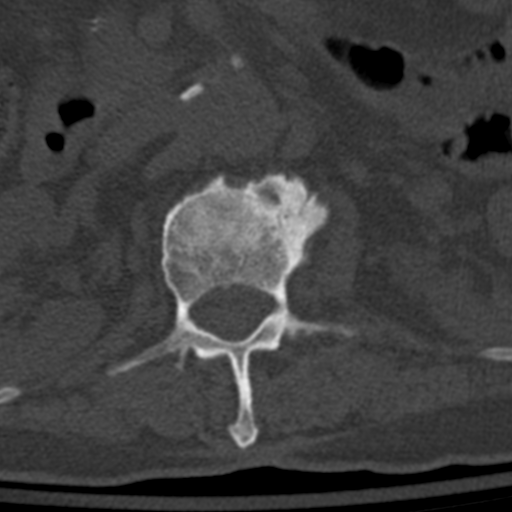
[im 112/131  bone]
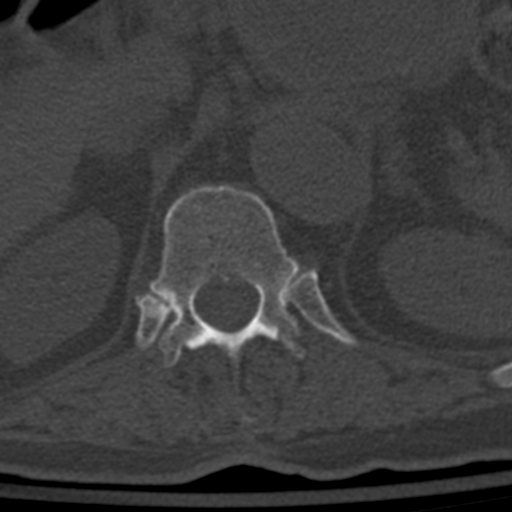

[Series 8: coronal bone · coronal · 0.33mm/px · 1 of 78 slices shown]
[im 39/78  bone]
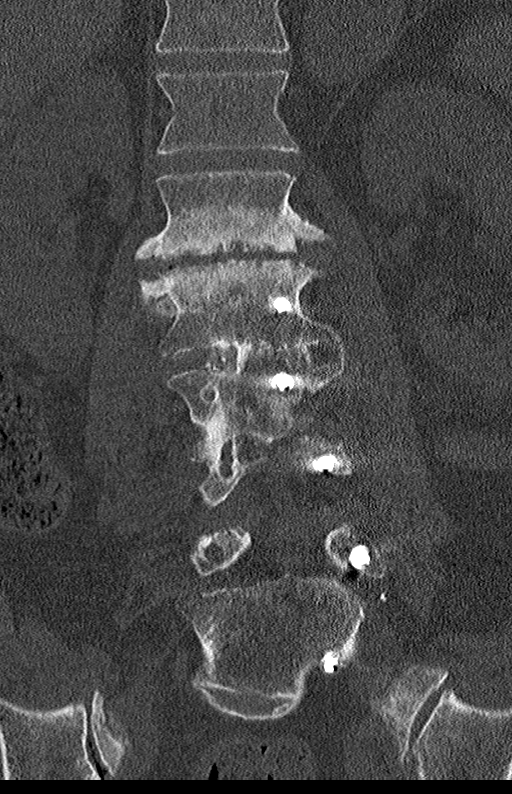

[Series 10: sagittal st · sagittal · 0.34mm/px · 5 of 84 slices shown, 6 images]
[im 28/84  bone]
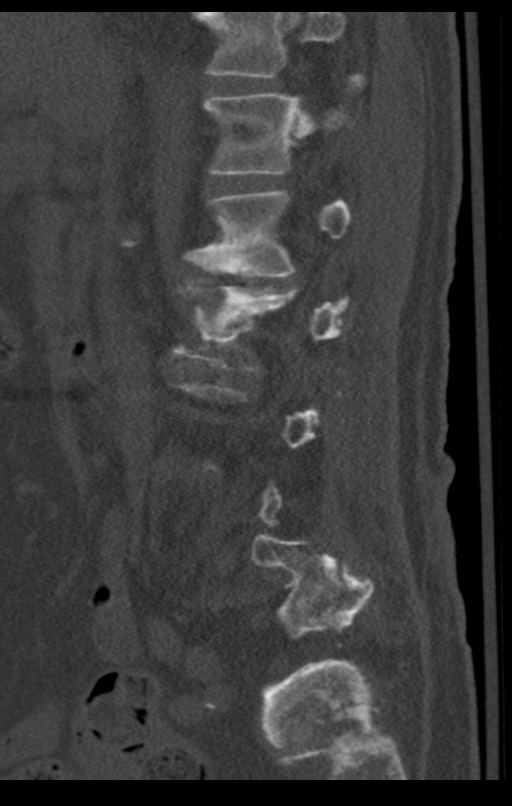
[im 35/84  bone]
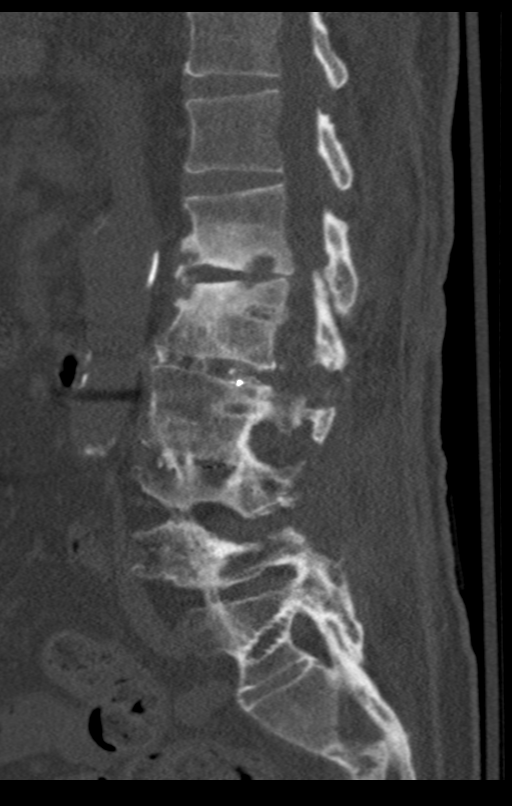
[im 42/84  soft-tissue]
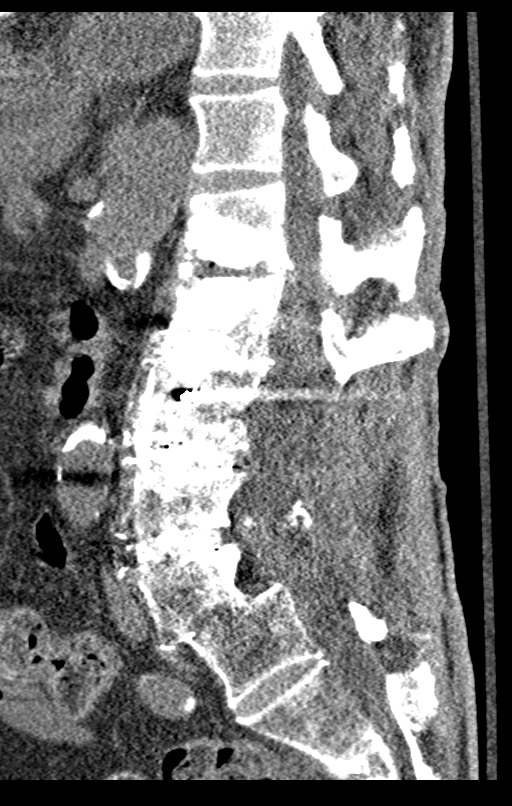
[im 42/84  bone]
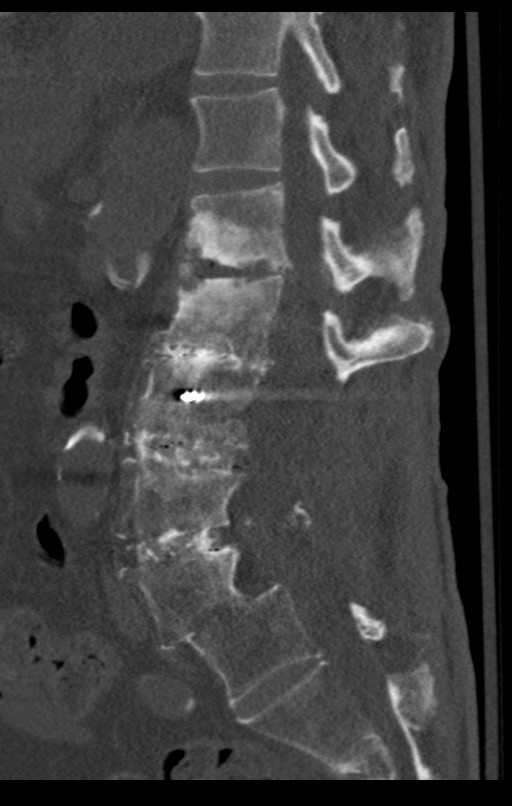
[im 49/84  bone]
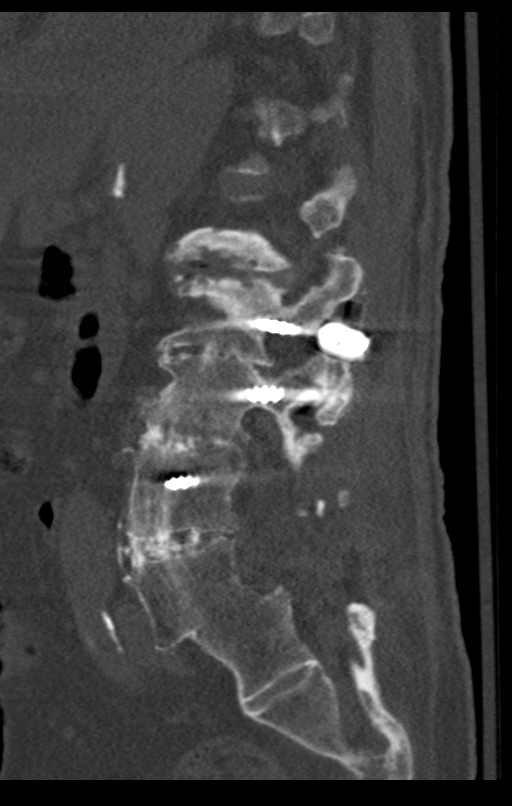
[im 56/84  bone]
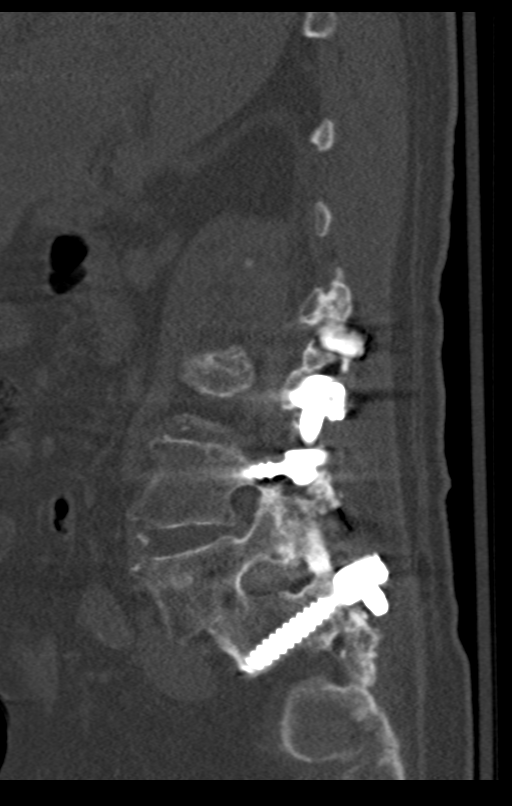

[12 of 33 positions shown; findings below may reference images not displayed]

FINDINGS: Segmentation: Transitional S1 vertebra with lumbarization. The same
numbering scheme was used on prior.

Alignment: Fused anterolisthesis at L5-S1. Reversal of lordosis in
the upper lumbar spine.

Vertebrae: Bilateral vertical sacral ala fractures extending to the
anterior sacral foramina without visible narrowing. There is also
transverse component at the S3 level. This shows patchy
heterogeneous sclerosis compatible with subacute timing.

Related likely subacute fracture through right posterior-lateral
arthrodesis at L5 and S1, lateral to the pedicles and at the level
of transverse processes.

Endplate erosions at L1-2, with sclerosis, is chronic and
degenerative when compared to prior.

Paraspinal and other soft tissues: Postoperative scarring dorsally.
There is presacral edema related to fractures.

Hiatal hernia.

Disc levels:

T12- L1: Unremarkable.

L1-L2: Severe disc disease with endplate degeneration, disc
collapse, and vacuum phenomenon. Bilateral foraminal narrowing.

L2-L3: PLIF with solid arthrodesis.

L3-L4: PLIF with solid arthrodesis.  Broad laminectomy.

L4-L5: PLIF with solid arthrodesis.  Broad laminectomy.

L5-S1:PLIF with fused anterolisthesis. There is right more than left
foraminal narrowing from disc height loss.
IMPRESSION: 1. Vertical and transverse sacral insufficiency fractures that are
likely subacute.
2. Fracture through L5-S1 right posterior-lateral arthrodesis at the
level of the transverse processes.
3. L2-S1 fusion.
4. Severe L1-2 adjacent segment disc degeneration.
5. Transitional S1 vertebra.

## 2019-12-08 DIAGNOSIS — Z23 Encounter for immunization: Secondary | ICD-10-CM | POA: Diagnosis not present

## 2019-12-19 DIAGNOSIS — R197 Diarrhea, unspecified: Secondary | ICD-10-CM | POA: Diagnosis not present

## 2019-12-19 DIAGNOSIS — K921 Melena: Secondary | ICD-10-CM | POA: Diagnosis not present

## 2019-12-30 DIAGNOSIS — R3914 Feeling of incomplete bladder emptying: Secondary | ICD-10-CM | POA: Diagnosis not present

## 2020-01-05 DIAGNOSIS — Z23 Encounter for immunization: Secondary | ICD-10-CM | POA: Diagnosis not present

## 2020-01-06 DIAGNOSIS — M545 Low back pain: Secondary | ICD-10-CM | POA: Diagnosis not present

## 2020-01-06 DIAGNOSIS — R293 Abnormal posture: Secondary | ICD-10-CM | POA: Diagnosis not present

## 2020-01-06 DIAGNOSIS — M6281 Muscle weakness (generalized): Secondary | ICD-10-CM | POA: Diagnosis not present

## 2020-01-06 DIAGNOSIS — R2681 Unsteadiness on feet: Secondary | ICD-10-CM | POA: Diagnosis not present

## 2020-01-07 ENCOUNTER — Ambulatory Visit: Payer: Medicare Other | Admitting: Adult Health

## 2020-01-11 ENCOUNTER — Encounter: Payer: Self-pay | Admitting: Adult Health

## 2020-01-12 ENCOUNTER — Other Ambulatory Visit: Payer: Self-pay

## 2020-01-12 ENCOUNTER — Inpatient Hospital Stay (HOSPITAL_COMMUNITY)
Admission: EM | Admit: 2020-01-12 | Discharge: 2020-01-17 | DRG: 373 | Disposition: A | Discharge status: Skilled Nursing Facility | Payer: Medicare Other | Attending: Internal Medicine | Admitting: Internal Medicine

## 2020-01-12 ENCOUNTER — Encounter (HOSPITAL_COMMUNITY): Payer: Self-pay

## 2020-01-12 DIAGNOSIS — G4733 Obstructive sleep apnea (adult) (pediatric): Secondary | ICD-10-CM | POA: Diagnosis present

## 2020-01-12 DIAGNOSIS — I2510 Atherosclerotic heart disease of native coronary artery without angina pectoris: Secondary | ICD-10-CM | POA: Diagnosis not present

## 2020-01-12 DIAGNOSIS — Z8249 Family history of ischemic heart disease and other diseases of the circulatory system: Secondary | ICD-10-CM

## 2020-01-12 DIAGNOSIS — Z974 Presence of external hearing-aid: Secondary | ICD-10-CM

## 2020-01-12 DIAGNOSIS — Z888 Allergy status to other drugs, medicaments and biological substances status: Secondary | ICD-10-CM

## 2020-01-12 DIAGNOSIS — H9190 Unspecified hearing loss, unspecified ear: Secondary | ICD-10-CM | POA: Diagnosis present

## 2020-01-12 DIAGNOSIS — I10 Essential (primary) hypertension: Secondary | ICD-10-CM | POA: Diagnosis not present

## 2020-01-12 DIAGNOSIS — N400 Enlarged prostate without lower urinary tract symptoms: Secondary | ICD-10-CM | POA: Diagnosis present

## 2020-01-12 DIAGNOSIS — R531 Weakness: Secondary | ICD-10-CM

## 2020-01-12 DIAGNOSIS — R197 Diarrhea, unspecified: Secondary | ICD-10-CM | POA: Diagnosis not present

## 2020-01-12 DIAGNOSIS — Z79899 Other long term (current) drug therapy: Secondary | ICD-10-CM

## 2020-01-12 DIAGNOSIS — E46 Unspecified protein-calorie malnutrition: Secondary | ICD-10-CM | POA: Diagnosis not present

## 2020-01-12 DIAGNOSIS — Z8042 Family history of malignant neoplasm of prostate: Secondary | ICD-10-CM

## 2020-01-12 DIAGNOSIS — Z89411 Acquired absence of right great toe: Secondary | ICD-10-CM

## 2020-01-12 DIAGNOSIS — E876 Hypokalemia: Secondary | ICD-10-CM | POA: Diagnosis not present

## 2020-01-12 DIAGNOSIS — M1990 Unspecified osteoarthritis, unspecified site: Secondary | ICD-10-CM | POA: Diagnosis present

## 2020-01-12 DIAGNOSIS — N390 Urinary tract infection, site not specified: Secondary | ICD-10-CM | POA: Diagnosis not present

## 2020-01-12 DIAGNOSIS — Z8546 Personal history of malignant neoplasm of prostate: Secondary | ICD-10-CM

## 2020-01-12 DIAGNOSIS — M545 Low back pain: Secondary | ICD-10-CM | POA: Diagnosis not present

## 2020-01-12 DIAGNOSIS — Z20822 Contact with and (suspected) exposure to covid-19: Secondary | ICD-10-CM | POA: Diagnosis present

## 2020-01-12 DIAGNOSIS — Z981 Arthrodesis status: Secondary | ICD-10-CM

## 2020-01-12 DIAGNOSIS — G3184 Mild cognitive impairment, so stated: Secondary | ICD-10-CM | POA: Diagnosis not present

## 2020-01-12 DIAGNOSIS — Z89421 Acquired absence of other right toe(s): Secondary | ICD-10-CM

## 2020-01-12 DIAGNOSIS — M542 Cervicalgia: Secondary | ICD-10-CM | POA: Diagnosis present

## 2020-01-12 DIAGNOSIS — E785 Hyperlipidemia, unspecified: Secondary | ICD-10-CM | POA: Diagnosis present

## 2020-01-12 DIAGNOSIS — N3000 Acute cystitis without hematuria: Secondary | ICD-10-CM

## 2020-01-12 DIAGNOSIS — K219 Gastro-esophageal reflux disease without esophagitis: Secondary | ICD-10-CM | POA: Diagnosis present

## 2020-01-12 DIAGNOSIS — Z923 Personal history of irradiation: Secondary | ICD-10-CM

## 2020-01-12 DIAGNOSIS — Z960 Presence of urogenital implants: Secondary | ICD-10-CM | POA: Diagnosis present

## 2020-01-12 DIAGNOSIS — F329 Major depressive disorder, single episode, unspecified: Secondary | ICD-10-CM | POA: Diagnosis present

## 2020-01-12 DIAGNOSIS — M549 Dorsalgia, unspecified: Secondary | ICD-10-CM | POA: Diagnosis present

## 2020-01-12 DIAGNOSIS — M62838 Other muscle spasm: Secondary | ICD-10-CM | POA: Diagnosis present

## 2020-01-12 DIAGNOSIS — G894 Chronic pain syndrome: Secondary | ICD-10-CM | POA: Diagnosis not present

## 2020-01-12 DIAGNOSIS — E869 Volume depletion, unspecified: Secondary | ICD-10-CM | POA: Diagnosis present

## 2020-01-12 DIAGNOSIS — E559 Vitamin D deficiency, unspecified: Secondary | ICD-10-CM | POA: Diagnosis present

## 2020-01-12 DIAGNOSIS — M810 Age-related osteoporosis without current pathological fracture: Secondary | ICD-10-CM | POA: Diagnosis present

## 2020-01-12 DIAGNOSIS — Z79891 Long term (current) use of opiate analgesic: Secondary | ICD-10-CM

## 2020-01-12 DIAGNOSIS — A0472 Enterocolitis due to Clostridium difficile, not specified as recurrent: Secondary | ICD-10-CM | POA: Diagnosis not present

## 2020-01-12 LAB — CBC
HCT: 39.9 % (ref 39.0–52.0)
Hemoglobin: 13.1 g/dL (ref 13.0–17.0)
MCH: 29.8 pg (ref 26.0–34.0)
MCHC: 32.8 g/dL (ref 30.0–36.0)
MCV: 90.7 fL (ref 80.0–100.0)
Platelets: 232 10*3/uL (ref 150–400)
RBC: 4.4 MIL/uL (ref 4.22–5.81)
RDW: 14.2 % (ref 11.5–15.5)
WBC: 16.1 10*3/uL — ABNORMAL HIGH (ref 4.0–10.5)
nRBC: 0 % (ref 0.0–0.2)

## 2020-01-12 LAB — URINALYSIS, ROUTINE W REFLEX MICROSCOPIC
Bilirubin Urine: NEGATIVE
Glucose, UA: NEGATIVE mg/dL
Ketones, ur: 20 mg/dL — AB
Nitrite: POSITIVE — AB
Protein, ur: 100 mg/dL — AB
Specific Gravity, Urine: 1.023 (ref 1.005–1.030)
WBC, UA: >50 WBC/hpf — ABNORMAL HIGH (ref 0–5)
pH: 5 (ref 5.0–8.0)

## 2020-01-12 LAB — COMPREHENSIVE METABOLIC PANEL
ALT: 18 U/L (ref 0–44)
AST: 38 U/L (ref 15–41)
Albumin: 3 g/dL — ABNORMAL LOW (ref 3.5–5.0)
Alkaline Phosphatase: 76 U/L (ref 38–126)
Anion gap: 10 (ref 5–15)
BUN: 18 mg/dL (ref 8–23)
CO2: 21 mmol/L — ABNORMAL LOW (ref 22–32)
Calcium: 8.2 mg/dL — ABNORMAL LOW (ref 8.9–10.3)
Chloride: 103 mmol/L (ref 98–111)
Creatinine, Ser: 0.94 mg/dL (ref 0.61–1.24)
GFR calc Af Amer: >60 mL/min (ref >60)
GFR calc non Af Amer: >60 mL/min (ref >60)
Glucose, Bld: 109 mg/dL — ABNORMAL HIGH (ref 70–99)
Potassium: 3.2 mmol/L — ABNORMAL LOW (ref 3.5–5.1)
Sodium: 134 mmol/L — ABNORMAL LOW (ref 135–145)
Total Bilirubin: 0.6 mg/dL (ref 0.3–1.2)
Total Protein: 6.5 g/dL (ref 6.5–8.1)

## 2020-01-12 LAB — LIPASE, BLOOD: Lipase: 15 U/L (ref 11–51)

## 2020-01-12 MED ORDER — SODIUM CHLORIDE 0.9 % IV BOLUS
1000.0000 mL | Freq: Once | INTRAVENOUS | Status: AC
  Administered 2020-01-13: 1000 mL via INTRAVENOUS

## 2020-01-12 MED ORDER — SODIUM CHLORIDE 0.9 % IV SOLN
2.0000 g | Freq: Once | INTRAVENOUS | Status: AC
  Administered 2020-01-13: 2 g via INTRAVENOUS
  Filled 2020-01-12: qty 20

## 2020-01-12 MED ORDER — SODIUM CHLORIDE 0.9% FLUSH: 3.0000 mL | Freq: Once | INTRAVENOUS | Status: DC

## 2020-01-12 MED ORDER — SODIUM CHLORIDE 0.9 % IV BOLUS
1000.0000 mL | Freq: Once | INTRAVENOUS | Status: AC
  Administered 2020-01-12: 1000 mL via INTRAVENOUS

## 2020-01-12 NOTE — ED Provider Notes (Signed)
[facility] DEPT Provider Note   CSN: [id number] Arrival date & time: 01/12/20  1950     History Chief Complaint  Patient presents with  . Diarrhea    Charles [name] is a 86 y.o. male with past medical history of arthritis, AVN, Barrett's esophagus, BPH, depression, diverticulosis, GERD who presents for evaluation of diarrhea, generalized weakness and fatigue x4 days.  He states that over the last 4 to 5 days, he has not been able to move much and has had no energy and has not been able to get out of bed.  He states that he has had ongoing intermittent diarrhea over the last year or so.  He felt like it had gotten slightly better over the last few weeks and then returned in the last week or so.  He states he has had 5-6 episodes of loose, watery stools a day.  No blood noted in stools.  He states he is also had decreased appetite but denies any nausea/vomiting.  He states he does not have any associated abdominal pain.  He states he just feels weak and tired all over.  He feels like he has no energy.  He states that he has a Foley catheter that has been inserted for the last 2 to 3 years.  He denies any fevers, chest pain, difficulty breathing, cough, abdominal pain.  The history is provided by the patient.       Past Medical History:  Diagnosis Date  . Anemia   . Arthritis   . AVM (arteriovenous malformation)   . Barrett's esophagus   . BPH (benign prostatic hyperplasia)   . Cataract   . Constipation   . CPAP (continuous positive airway pressure) dependence   . Depression   . Diverticulosis    diverticular bleeding  . Diverticulosis   . GERD (gastroesophageal reflux disease)   . History of radiation therapy   . HOH (hard of hearing)    wears hearing aids  . Hyperlipidemia   . Hypoglycemia   . Lymphocytosis 06/2012  . Mastodynia   . Murmur    slight  . Nasal congestion   . Osteoporosis   . Osteoporosis   . Prostate cancer ([place])   .  Sleep apnea    wears CPAP  . Urinary retention with incomplete bladder emptying    pt uses bathroom and within 30 mins needs to go again  . Vitamin D deficiency     Patient Active Problem List   Diagnosis Date Noted  . Acute lower UTI 01/13/2020  . Weakness generalized 01/13/2020  . UTI (urinary tract infection) 01/13/2020  . Pain in right elbow 05/30/2019  . Osteomyelitis of second toe of right foot ([place]) 04/03/2019  . Hammertoe of second toe of right foot 04/03/2019  . Ulcer of second toe of right foot ([place]) 03/21/2019  . Osteomyelitis of great toe of right foot ([place]) 12/05/2018  . Sacral insufficiency fracture 07/04/2018  . Unsteady gait 07/04/2018  . Mild cognitive impairment 07/04/2018  . Neuropathy 07/04/2018  . CAD (coronary artery disease) 05/29/2018  . AVM (arteriovenous malformation) of colon   . Radiation proctitis   . Amputation of right great toe ([place]) 03/01/2018  . Diverticulosis of colon with hemorrhage   . Acute blood loss anemia   . History of back surgery 10/03/2017  . Rectal bleeding 10/03/2017  . Hematochezia   . Lower GI bleeding 09/29/2017  . Treatment-emergent central sleep apnea 06/25/2017  . Chronic bilateral low  back pain with bilateral sciatica 01/17/2017  . Post laminectomy syndrome 01/17/2017  . Chronic pain syndrome 01/17/2017  . Sacroiliitis ([place]) 01/17/2017  . Piriformis syndrome of right side 01/17/2017  . Malignant neoplasm of prostate ([place]) 07/19/2016  . Fixation hardware in spine 05/15/2016  . Hypoglycemic reaction 02/02/2015  . Tremor, essential 12/11/2014  . OSA on CPAP 04/24/2014  . 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 Hypersomnia with sleep apnea, unspecified 04/24/2014  . Depression 04/18/2013  . BPH (benign prostatic hyperplasia) 04/18/2013  . Impingement syndrome of right shoulder 04/14/2013    Class: Acute  . Scoliosis or kyphoscoliosis 04/08/2013    Class: Chronic  . Lumbosacral spondylosis without myelopathy  04/08/2013    Class: Chronic  . Acute respiratory failure ([place]) 04/08/2013  . Essential hypertension 04/08/2013  . Lymphocytosis   . Diaphragmatic hernia 11/21/2010  . DIVERTICULAR DISEASE 11/21/2010  . BARRETT'S ESOPHAGUS 02/22/2010  . GERD 07/20/2008  . INGUINAL HERNIA, LEFT, SMALL 07/20/2008  . OTH VENTRAL HERN W/O MENTION OBSTRUCTION/GANGREN 07/20/2008  . CONSTIPATION 07/20/2008  . DYSPHAGIA 07/20/2008    Past Surgical History:  Procedure Laterality Date  . AMPUTATION TOE Right 09/20/2017   Procedure: Right 4th toe amputation;  Surgeon: Wylene Simmer, MD;  Location: [facility];  Service: Orthopedics;  Laterality: Right;  foot block  . AMPUTATION TOE Right 02/05/2019   Procedure: RIGHT GREAT TOE AMPUTATION;  Surgeon: Leandrew Koyanagi, MD;  Location: [facility];  Service: Orthopedics;  Laterality: Right;  . BACK SURGERY  04/2013  . back surgury     f  . BLEPHAROPLASTY Bilateral 02/23/15  . CATARACT EXTRACTION    . COLONOSCOPY    . COLONOSCOPY N/A 10/02/2017   Procedure: COLONOSCOPY;  Surgeon: Doran Stabler, MD;  Location: [facility];  Service: Gastroenterology;  Laterality: N/A;  . COLONOSCOPY WITH PROPOFOL N/A 10/01/2017   Procedure: COLONOSCOPY WITH PROPOFOL;  Surgeon: Doran Stabler, MD;  Location: [facility];  Service: Gastroenterology;  Laterality: N/A;  . COLONOSCOPY WITH PROPOFOL N/A 03/07/2018   Procedure: COLONOSCOPY WITH PROPOFOL;  Surgeon: [name], MD;  Location: [facility];  Service: Endoscopy;  Laterality: N/A;  . ESOPHAGOGASTRODUODENOSCOPY (EGD) WITH PROPOFOL N/A 10/01/2017   Procedure: ESOPHAGOGASTRODUODENOSCOPY (EGD) WITH PROPOFOL;  Surgeon: Doran Stabler, MD;  Location: [facility];  Service: Gastroenterology;  Laterality: N/A;  . EYE SURGERY Bilateral   . FINGER SURGERY Right    right thumb  . HARDWARE REMOVAL Right 05/15/2016   Procedure: Right Lumbar Two-Lumbar Five Removal of Hardware;  Surgeon: Kristeen Miss, MD;  Location: [facility];  Service: Neurosurgery;  Laterality: Right;  Right L2-5 removal of hardware  . INGUINAL HERNIA REPAIR     x4  . IR RADIOLOGIST EVAL & MGMT  07/10/2018  . IR RADIOLOGIST EVAL & MGMT  09/05/2018  . IR SACROPLASTY BILATERAL  07/15/2018  . REPAIR SPIGELIAN HERNIA  2011  . [place] ,2014  . TONSILLECTOMY    . UPPER GASTROINTESTINAL ENDOSCOPY    . VASECTOMY         Family History  Problem Relation Age of Onset  . Prostate cancer Father   . CAD Mother   . Arthritis-Osteo Brother   . Colon cancer Neg Hx   . Stomach cancer Neg Hx     Social History   Tobacco Use  . Smoking status: Never Smoker  . Smokeless tobacco: Never Used  Substance Use Topics  . Alcohol use:  No  . Drug use: No    Home Medications Prior to Admission medications   Medication Sig Start Date End Date Taking? Authorizing Provider  acetaminophen (TYLENOL) 500 MG tablet Take 500 mg by mouth every 6 (six) hours as needed.    [provider]  buPROPion (WELLBUTRIN SR) 150 MG 12 hr tablet Take 150 mg by mouth 2 (two) times daily.  06/28/11   [provider]  carboxymethylcellul-glycerin (REFRESH REPAIR) 0.5-0.9 % ophthalmic solution Place 1 drop into both eyes 3 (three) times daily.    [provider]  cholecalciferol (VITAMIN D) 1000 UNITS tablet Take 1,000 Units by mouth daily.    [provider]  doxycycline (VIBRA-TABS) 100 MG tablet Take 1 tablet (100 mg total) by mouth 2 (two) times daily. 03/21/19   Aundra Dubin, PA-C  ferrous sulfate 325 (65 FE) MG EC tablet Take 325 mg by mouth daily with breakfast.    [provider]  gabapentin (NEURONTIN) 300 MG capsule Take 1 tablet PO at 4 PM  and 1 tablet at 10 PM 06/20/16   Ward Givens, NP  HYDROcodone-acetaminophen (NORCO) 5-325 MG tablet Take 1-2 tablets by mouth every 6 (six) hours as needed. 02/05/19   Leandrew Koyanagi, MD  HYDROcodone-acetaminophen (NORCO/VICODIN) 5-325 MG  tablet Take 1 tablet by mouth every 6 (six) hours as needed for moderate pain. 05/29/19   Jessy Oto, MD  lovastatin (MEVACOR) 20 MG tablet Take 10 mg by mouth at bedtime.  07/22/12   [provider]  Magnesium 250 MG TABS Take 250 mg by mouth daily.     [provider]  methocarbamol (ROBAXIN) 500 MG tablet Take 500 mg by mouth every 6 (six) hours as needed for muscle spasms.     [provider]  Multiple Vitamins-Minerals (CENTRUM ADULTS PO) Take 1 tablet by mouth daily.    [provider]  Multiple Vitamins-Minerals (PRESERVISION AREDS 2 PO) Take 1 capsule by mouth 2 (two) times daily.    [provider]  omeprazole (PRILOSEC) 40 MG capsule Take 1 capsule (40 mg total) by mouth 2 (two) times daily. 12/12/18   [name], MD  propranolol (INDERAL) 10 MG tablet Take 10 mg by mouth daily.     [provider]  rOPINIRole (REQUIP) 1 MG tablet TAKE 2 TABLETS(2 MG) BY MOUTH AT BEDTIME 01/27/19   Dohmeier, Asencion Partridge, MD  rOPINIRole (REQUIP) 4 MG tablet Take 4 mg by mouth at bedtime.    [provider]  sulfamethoxazole-trimethoprim (BACTRIM DS) 800-160 MG tablet Take 1 tablet by mouth 2 (two) times daily. 03/21/19   Aundra Dubin, PA-C  sulfamethoxazole-trimethoprim (BACTRIM DS) 800-160 MG tablet Take 1 tablet by mouth 2 (two) times daily. 04/03/19   Leandrew Koyanagi, MD  Tamsulosin HCl (FLOMAX) 0.4 MG CAPS Take 0.4 mg by mouth 2 (two) times daily.  08/30/11   [provider]  traMADol (ULTRAM) 50 MG tablet Take 100 mg by mouth 2 (two) times daily. Hold for sedation    [provider]    Allergies    Celebrex [celecoxib] and Flexeril [cyclobenzaprine]  Review of Systems   Review of Systems  Constitutional: Positive for fatigue. Negative for fever.  Respiratory: Negative for cough and shortness of breath.   Cardiovascular: Negative for chest pain.  Gastrointestinal: Positive for diarrhea. Negative for abdominal pain,  blood in stool, nausea and vomiting.  Genitourinary: Negative for dysuria and hematuria.  Neurological: Positive for weakness (generalized). Negative for  headaches.  All other systems reviewed and are negative.   Physical Exam Updated Vital Signs BP (!) 118/50   Pulse 72   Temp 98 F (36.7 C) (Oral)   Resp (!) 24   SpO2 96%   Physical Exam Vitals and nursing note reviewed.  Constitutional:      Appearance: Normal appearance. He is well-developed.     Comments: Frail and elderly appearing  HENT:     Head: Normocephalic and atraumatic.     Mouth/Throat:     Comments: Dry mucous membranes noted. Eyes:     General: Lids are normal.     Conjunctiva/sclera: Conjunctivae normal.     Pupils: Pupils are equal, round, and reactive to light.  Cardiovascular:     Rate and Rhythm: Normal rate and regular rhythm.     Pulses: Normal pulses.     Heart sounds: Normal heart sounds. No murmur. No friction rub. No gallop.   Pulmonary:     Effort: Pulmonary effort is normal.     Breath sounds: Normal breath sounds.     Comments: Lungs clear to auscultation bilaterally.  Symmetric chest rise.  No wheezing, rales, rhonchi. Abdominal:     Palpations: Abdomen is soft. Abdomen is not rigid.     Tenderness: There is no abdominal tenderness. There is no guarding.     Comments: Abdomen is soft, non-distended, non-tender. No rigidity, No guarding. No peritoneal signs.  Musculoskeletal:        General: Normal range of motion.     Cervical back: Full passive range of motion without pain.  Skin:    General: Skin is warm and dry.     Capillary Refill: Capillary refill takes less than 2 seconds.  Neurological:     Mental Status: He is alert and oriented to person, place, and time.     Comments: Follows commands, Moves all extremities  5/5 strength to BUE and BLE  Sensation intact throughout all major nerve distributions Alert and oriented x3 Follows commands.   Psychiatric:        Speech: Speech  normal.     ED Results / Procedures / Treatments   Labs (all labs ordered are listed, but only abnormal results are displayed) Labs Reviewed  COMPREHENSIVE METABOLIC PANEL - Abnormal; Notable for the following components:      Result Value   Sodium 134 (*)    Potassium 3.2 (*)    CO2 21 (*)    Glucose, Bld 109 (*)    Calcium 8.2 (*)    Albumin 3.0 (*)    All other components within normal limits  CBC - Abnormal; Notable for the following components:   WBC 16.1 (*)    All other components within normal limits  URINALYSIS, ROUTINE W REFLEX MICROSCOPIC - Abnormal; Notable for the following components:   Color, Urine AMBER (*)    APPearance CLOUDY (*)    Hgb urine dipstick MODERATE (*)    Ketones, ur 20 (*)    Protein, ur 100 (*)    Nitrite POSITIVE (*)    Leukocytes,Ua LARGE (*)    WBC, UA >50 (*)    Bacteria, UA MANY (*)    All other components within normal limits  URINE CULTURE  LIPASE, BLOOD  BASIC METABOLIC PANEL  CBC  POC SARS CORONAVIRUS 2 AG -  ED    EKG None  Radiology No results found.  Procedures Procedures (including critical care time)  Medications Ordered in ED Medications  sodium chloride  flush (NS) 0.9 % injection 3 mL (3 mLs Intravenous Not Given 01/12/20 2154)  0.9 % NaCl with KCl 40 mEq / L  infusion (has no administration in time range)  ondansetron (ZOFRAN) tablet 4 mg (has no administration in time range)    Or  ondansetron (ZOFRAN) injection 4 mg (has no administration in time range)  cefTRIAXone (ROCEPHIN) 1 g in sodium chloride 0.9 % 100 mL IVPB (has no administration in time range)  sodium chloride 0.9 % bolus 1,000 mL (0 mLs Intravenous Stopped 01/12/20 2245)  cefTRIAXone (ROCEPHIN) 2 g in sodium chloride 0.9 % 100 mL IVPB (0 g Intravenous Stopped 01/13/20 0031)  sodium chloride 0.9 % bolus 1,000 mL (1,000 mLs Intravenous New Bag/Given 01/13/20 0001)    ED Course  I have reviewed the triage vital signs and the nursing notes.  Pertinent  labs & imaging results that were available during my care of the patient were reviewed by me and considered in my medical decision making (see chart for details).    MDM Rules/Calculators/A&P                      86 year old male who presents for evaluation of diarrhea that has been ongoing intermittently for last several years but feeling worse in the last several days.  No blood noted in stools.  Reports feeling tired and worn out.  On initial arrival, he is afebrile, nontoxic-appearing.  Vital signs are stable.  He does appear slightly dehydrated.  Abdominal exam benign.  Plan to check labs, give fluids.  CMP shows potassium of 3.2.  BUN and creatinine within normal limits.  CBC shows leukocytosis of 16.1.  Lipase is unremarkable.  UA shows moderate hemoglobin.  He has nitrites, leukocytes, pyuria.  Urine culture sent.  We will plan to treat for UTI.  Patient still very weak and fatigued after fluids here in the emergency department.  At this time, given his risk factors and age, recommend admission for further treatment.  Discussed patient with Dr. Shanon Brow (hospitalist) who will accept patient for admission.   Portions of this note were generated with [employer]. Dictation errors may occur despite best attempts at proofreading.   Final Clinical Impression(s) / ED Diagnoses Final diagnoses:  Acute cystitis without hematuria  Generalized weakness    Rx / DC Orders ED Discharge Orders    None       Volanda Napoleon, PA-C 01/13/20 0051    Margette Fast, MD 01/13/20 1016

## 2020-01-12 NOTE — ED Triage Notes (Signed)
Pt reports diarrhea, weakness, and fatigue x4 days. States that he feels work out. Concerned that he is dehydrated. A&Ox4.

## 2020-01-13 ENCOUNTER — Other Ambulatory Visit: Payer: Self-pay

## 2020-01-13 DIAGNOSIS — R2689 Other abnormalities of gait and mobility: Secondary | ICD-10-CM | POA: Diagnosis not present

## 2020-01-13 DIAGNOSIS — H9190 Unspecified hearing loss, unspecified ear: Secondary | ICD-10-CM | POA: Diagnosis present

## 2020-01-13 DIAGNOSIS — R402411 Glasgow coma scale score 13-15, in the field [EMT or ambulance]: Secondary | ICD-10-CM | POA: Diagnosis not present

## 2020-01-13 DIAGNOSIS — I69354 Hemiplegia and hemiparesis following cerebral infarction affecting left non-dominant side: Secondary | ICD-10-CM | POA: Diagnosis not present

## 2020-01-13 DIAGNOSIS — Z974 Presence of external hearing-aid: Secondary | ICD-10-CM | POA: Diagnosis not present

## 2020-01-13 DIAGNOSIS — R41841 Cognitive communication deficit: Secondary | ICD-10-CM | POA: Diagnosis not present

## 2020-01-13 DIAGNOSIS — M545 Low back pain: Secondary | ICD-10-CM | POA: Diagnosis not present

## 2020-01-13 DIAGNOSIS — F329 Major depressive disorder, single episode, unspecified: Secondary | ICD-10-CM | POA: Diagnosis present

## 2020-01-13 DIAGNOSIS — A0472 Enterocolitis due to Clostridium difficile, not specified as recurrent: Secondary | ICD-10-CM | POA: Diagnosis present

## 2020-01-13 DIAGNOSIS — E876 Hypokalemia: Secondary | ICD-10-CM | POA: Diagnosis present

## 2020-01-13 DIAGNOSIS — E869 Volume depletion, unspecified: Secondary | ICD-10-CM | POA: Diagnosis present

## 2020-01-13 DIAGNOSIS — Z8546 Personal history of malignant neoplasm of prostate: Secondary | ICD-10-CM | POA: Diagnosis not present

## 2020-01-13 DIAGNOSIS — Z89411 Acquired absence of right great toe: Secondary | ICD-10-CM | POA: Diagnosis not present

## 2020-01-13 DIAGNOSIS — I10 Essential (primary) hypertension: Secondary | ICD-10-CM | POA: Diagnosis present

## 2020-01-13 DIAGNOSIS — R531 Weakness: Secondary | ICD-10-CM

## 2020-01-13 DIAGNOSIS — K219 Gastro-esophageal reflux disease without esophagitis: Secondary | ICD-10-CM | POA: Diagnosis present

## 2020-01-13 DIAGNOSIS — Z7401 Bed confinement status: Secondary | ICD-10-CM | POA: Diagnosis not present

## 2020-01-13 DIAGNOSIS — N400 Enlarged prostate without lower urinary tract symptoms: Secondary | ICD-10-CM | POA: Diagnosis present

## 2020-01-13 DIAGNOSIS — N3000 Acute cystitis without hematuria: Secondary | ICD-10-CM | POA: Diagnosis not present

## 2020-01-13 DIAGNOSIS — R197 Diarrhea, unspecified: Secondary | ICD-10-CM | POA: Diagnosis not present

## 2020-01-13 DIAGNOSIS — K5731 Diverticulosis of large intestine without perforation or abscess with bleeding: Secondary | ICD-10-CM | POA: Diagnosis not present

## 2020-01-13 DIAGNOSIS — M810 Age-related osteoporosis without current pathological fracture: Secondary | ICD-10-CM | POA: Diagnosis present

## 2020-01-13 DIAGNOSIS — G3184 Mild cognitive impairment, so stated: Secondary | ICD-10-CM | POA: Diagnosis present

## 2020-01-13 DIAGNOSIS — M62838 Other muscle spasm: Secondary | ICD-10-CM | POA: Diagnosis present

## 2020-01-13 DIAGNOSIS — M6281 Muscle weakness (generalized): Secondary | ICD-10-CM | POA: Diagnosis not present

## 2020-01-13 DIAGNOSIS — E559 Vitamin D deficiency, unspecified: Secondary | ICD-10-CM | POA: Diagnosis present

## 2020-01-13 DIAGNOSIS — M542 Cervicalgia: Secondary | ICD-10-CM | POA: Diagnosis present

## 2020-01-13 DIAGNOSIS — Z888 Allergy status to other drugs, medicaments and biological substances status: Secondary | ICD-10-CM | POA: Diagnosis not present

## 2020-01-13 DIAGNOSIS — G8194 Hemiplegia, unspecified affecting left nondominant side: Secondary | ICD-10-CM | POA: Diagnosis not present

## 2020-01-13 DIAGNOSIS — N390 Urinary tract infection, site not specified: Secondary | ICD-10-CM | POA: Diagnosis not present

## 2020-01-13 DIAGNOSIS — R52 Pain, unspecified: Secondary | ICD-10-CM | POA: Diagnosis not present

## 2020-01-13 DIAGNOSIS — Z960 Presence of urogenital implants: Secondary | ICD-10-CM | POA: Diagnosis present

## 2020-01-13 DIAGNOSIS — I744 Embolism and thrombosis of arteries of extremities, unspecified: Secondary | ICD-10-CM | POA: Diagnosis not present

## 2020-01-13 DIAGNOSIS — Z923 Personal history of irradiation: Secondary | ICD-10-CM | POA: Diagnosis not present

## 2020-01-13 DIAGNOSIS — M1990 Unspecified osteoarthritis, unspecified site: Secondary | ICD-10-CM | POA: Diagnosis present

## 2020-01-13 DIAGNOSIS — I2510 Atherosclerotic heart disease of native coronary artery without angina pectoris: Secondary | ICD-10-CM | POA: Diagnosis present

## 2020-01-13 DIAGNOSIS — Z20822 Contact with and (suspected) exposure to covid-19: Secondary | ICD-10-CM | POA: Diagnosis present

## 2020-01-13 DIAGNOSIS — G894 Chronic pain syndrome: Secondary | ICD-10-CM | POA: Diagnosis present

## 2020-01-13 DIAGNOSIS — E785 Hyperlipidemia, unspecified: Secondary | ICD-10-CM | POA: Diagnosis present

## 2020-01-13 DIAGNOSIS — M2550 Pain in unspecified joint: Secondary | ICD-10-CM | POA: Diagnosis not present

## 2020-01-13 LAB — CBC
HCT: 39.5 % (ref 39.0–52.0)
Hemoglobin: 13.1 g/dL (ref 13.0–17.0)
MCH: 30.3 pg (ref 26.0–34.0)
MCHC: 33.2 g/dL (ref 30.0–36.0)
MCV: 91.2 fL (ref 80.0–100.0)
Platelets: 247 10*3/uL (ref 150–400)
RBC: 4.33 MIL/uL (ref 4.22–5.81)
RDW: 14.1 % (ref 11.5–15.5)
WBC: 12.6 10*3/uL — ABNORMAL HIGH (ref 4.0–10.5)
nRBC: 0 % (ref 0.0–0.2)

## 2020-01-13 LAB — BASIC METABOLIC PANEL
Anion gap: 12 (ref 5–15)
BUN: 15 mg/dL (ref 8–23)
CO2: 17 mmol/L — ABNORMAL LOW (ref 22–32)
Calcium: 7.9 mg/dL — ABNORMAL LOW (ref 8.9–10.3)
Chloride: 107 mmol/L (ref 98–111)
Creatinine, Ser: 0.9 mg/dL (ref 0.61–1.24)
GFR calc Af Amer: >60 mL/min (ref >60)
GFR calc non Af Amer: >60 mL/min (ref >60)
Glucose, Bld: 103 mg/dL — ABNORMAL HIGH (ref 70–99)
Potassium: 2.9 mmol/L — ABNORMAL LOW (ref 3.5–5.1)
Sodium: 136 mmol/L (ref 135–145)

## 2020-01-13 LAB — SARS CORONAVIRUS 2 (TAT 6-24 HRS): SARS Coronavirus 2: NEGATIVE

## 2020-01-13 LAB — POC SARS CORONAVIRUS 2 AG -  ED: SARS Coronavirus 2 Ag: NEGATIVE

## 2020-01-13 MED ORDER — CHLORHEXIDINE GLUCONATE CLOTH 2 % EX PADS
6.0000 | MEDICATED_PAD | Freq: Every day | CUTANEOUS | Status: DC
  Administered 2020-01-13 – 2020-01-17 (×5): 6 via TOPICAL

## 2020-01-13 MED ORDER — HYPROMELLOSE (GONIOSCOPIC) 2.5 % OP SOLN
1.0000 [drp] | Freq: Three times a day (TID) | OPHTHALMIC | Status: DC
  Filled 2020-01-13: qty 15

## 2020-01-13 MED ORDER — ONDANSETRON HCL 4 MG/2ML IJ SOLN: 4.0000 mg | Freq: Four times a day (QID) | INTRAMUSCULAR | Status: DC | PRN

## 2020-01-13 MED ORDER — GABAPENTIN 300 MG PO CAPS
300.0000 mg | ORAL_CAPSULE | Freq: Three times a day (TID) | ORAL | Status: DC
  Administered 2020-01-13 – 2020-01-16 (×9): 300 mg via ORAL
  Filled 2020-01-13 (×9): qty 1

## 2020-01-13 MED ORDER — METHOCARBAMOL 500 MG PO TABS
500.0000 mg | ORAL_TABLET | Freq: Four times a day (QID) | ORAL | Status: DC | PRN
  Administered 2020-01-15: 500 mg via ORAL
  Filled 2020-01-13: qty 1

## 2020-01-13 MED ORDER — POLYVINYL ALCOHOL 1.4 % OP SOLN
1.0000 [drp] | Freq: Three times a day (TID) | OPHTHALMIC | Status: DC
  Administered 2020-01-13 – 2020-01-17 (×11): 1 [drp] via OPHTHALMIC
  Filled 2020-01-13: qty 15

## 2020-01-13 MED ORDER — ONDANSETRON HCL 4 MG PO TABS: 4.0000 mg | ORAL_TABLET | Freq: Four times a day (QID) | ORAL | Status: DC | PRN

## 2020-01-13 MED ORDER — PANTOPRAZOLE SODIUM 40 MG PO TBEC
40.0000 mg | DELAYED_RELEASE_TABLET | Freq: Two times a day (BID) | ORAL | Status: DC
  Administered 2020-01-13 – 2020-01-17 (×9): 40 mg via ORAL
  Filled 2020-01-13 (×9): qty 1

## 2020-01-13 MED ORDER — HYDROCODONE-ACETAMINOPHEN 5-325 MG PO TABS
1.0000 | ORAL_TABLET | Freq: Four times a day (QID) | ORAL | Status: DC | PRN
  Administered 2020-01-14 – 2020-01-16 (×4): 1 via ORAL
  Filled 2020-01-13 (×4): qty 1

## 2020-01-13 MED ORDER — SODIUM CHLORIDE 0.9 % IV SOLN
1.0000 g | INTRAVENOUS | Status: DC
  Administered 2020-01-13: 1 g via INTRAVENOUS
  Filled 2020-01-13: qty 1
  Filled 2020-01-13: qty 10

## 2020-01-13 MED ORDER — ROPINIROLE HCL 1 MG PO TABS
2.0000 mg | ORAL_TABLET | Freq: Every day | ORAL | Status: DC
  Administered 2020-01-13 – 2020-01-16 (×4): 2 mg via ORAL
  Filled 2020-01-13 (×4): qty 2

## 2020-01-13 MED ORDER — PROPRANOLOL HCL 10 MG PO TABS
10.0000 mg | ORAL_TABLET | Freq: Every day | ORAL | Status: DC
  Administered 2020-01-13 – 2020-01-17 (×5): 10 mg via ORAL
  Filled 2020-01-13 (×5): qty 1

## 2020-01-13 MED ORDER — BUPROPION HCL ER (SR) 150 MG PO TB12
150.0000 mg | ORAL_TABLET | Freq: Two times a day (BID) | ORAL | Status: DC
  Administered 2020-01-13 – 2020-01-17 (×9): 150 mg via ORAL
  Filled 2020-01-13 (×9): qty 1

## 2020-01-13 MED ORDER — CARBOXYMETHYLCELLUL-GLYCERIN 0.5-0.9 % OP SOLN: 1.0000 [drp] | Freq: Three times a day (TID) | OPHTHALMIC | Status: DC

## 2020-01-13 MED ORDER — POTASSIUM CHLORIDE IN NACL 40-0.9 MEQ/L-% IV SOLN
INTRAVENOUS | Status: DC
  Administered 2020-01-13 – 2020-01-16 (×6): 75 mL/h via INTRAVENOUS
  Filled 2020-01-13 (×7): qty 1000

## 2020-01-13 NOTE — Progress Notes (Signed)
   Follow Up Note  HPI: Please refer to H&P done earlier this morning Briefly 86 year old male with medical history significant for AVM, hypertension, OSA presents to the ED complaining of progressive generalized weakness, with worsening chronic diarrhea, poor appetite.  Patient denies any fever/chills, nausea/vomiting, chest pain, shortness of breath, abdominal pain, cough.  Patient does live alone in an assisted living facility, found to have UTI and referred for admission.  In the ED, vital signs stable, labs showed hypokalemia, WBC 16.1, UA showed large leukocytes, positive nitrites, many bacteria, greater than 50 WBC.  Note, patient has a chronic indwelling Foley catheter, which is changed about every month.  Patient admitted for further management.    Today, patient denies any new complaints, just feeling very tired.  Denies any chest pain, shortness of breath, abdominal pain, fever/chills.   Exam:  General: NAD   Cardiovascular: S1, S2 present  Respiratory: CTAB  Abdomen: Soft, nontender, nondistended, bowel sounds present  Musculoskeletal: No bilateral pedal edema noted  Skin: Normal  Psychiatry: Normal mood    Present on Admission: . CAD (coronary artery disease) . Chronic pain syndrome . Essential hypertension . Mild cognitive impairment . Acute lower UTI . UTI (urinary tract infection)   Assessment/plan  UTI with chronic indwelling Foley catheter Currently afebrile, with leukocytosis UA positive for infection UC, BC x2 pending Continue IV Rocephin  Hypokalemia Replace as needed  Acute on chronic diarrhea Appears dry GI panel pending Continue IV fluid with potassium supplement  Chronic back pain Continue home pain regimen

## 2020-01-13 NOTE — H&P (Signed)
History and Physical    Charles [name] [id number] DOB: 03-11-1931 DOA: 01/12/2020  PCP: Leanna Battles, MD  Patient coming from: Assisted living  Chief Complaint: Weakness and diarrhea  HPI: Charles [name] is a 86 y.o. male with medical history significant of AVM, hypertension, obstructive sleep apnea comes in with 4 to 5 days of profound weakness and diarrhea.  Patient does have a history of chronic diarrhea but over the last 4 days it is been much worse than normal.  He denies any fevers.  He is not been eating and drinking very well.  He has been feeling awful.  Denies any nausea or vomiting.  Denies any abdominal pain.  Denies any cough or shortness of breath.  Patient sent in because of weakness from his assisted living facility.  He does live alone.  Patient found to have a UTI and referred for admission for such.  Covid screen is pending.   Review of Systems: As per HPI otherwise 10 point review of systems negative.   Past Medical History:  Diagnosis Date  . Anemia   . Arthritis   . AVM (arteriovenous malformation)   . Barrett's esophagus   . BPH (benign prostatic hyperplasia)   . Cataract   . Constipation   . CPAP (continuous positive airway pressure) dependence   . Depression   . Diverticulosis    diverticular bleeding  . Diverticulosis   . GERD (gastroesophageal reflux disease)   . History of radiation therapy   . HOH (hard of hearing)    wears hearing aids  . Hyperlipidemia   . Hypoglycemia   . Lymphocytosis 06/2012  . Mastodynia   . Murmur    slight  . Nasal congestion   . Osteoporosis   . Osteoporosis   . Prostate cancer ([place])   . Sleep apnea    wears CPAP  . Urinary retention with incomplete bladder emptying    pt uses bathroom and within 30 mins needs to go again  . Vitamin D deficiency     Past Surgical History:  Procedure Laterality Date  . AMPUTATION TOE Right 09/20/2017   Procedure: Right 4th toe amputation;  Surgeon: Wylene Simmer,  MD;  Location: [facility];  Service: Orthopedics;  Laterality: Right;  foot block  . AMPUTATION TOE Right 02/05/2019   Procedure: RIGHT GREAT TOE AMPUTATION;  Surgeon: Leandrew Koyanagi, MD;  Location: [facility];  Service: Orthopedics;  Laterality: Right;  . BACK SURGERY  04/2013  . back surgury     f  . BLEPHAROPLASTY Bilateral 02/23/15  . CATARACT EXTRACTION    . COLONOSCOPY    . COLONOSCOPY N/A 10/02/2017   Procedure: COLONOSCOPY;  Surgeon: Doran Stabler, MD;  Location: [facility];  Service: Gastroenterology;  Laterality: N/A;  . COLONOSCOPY WITH PROPOFOL N/A 10/01/2017   Procedure: COLONOSCOPY WITH PROPOFOL;  Surgeon: Doran Stabler, MD;  Location: [facility];  Service: Gastroenterology;  Laterality: N/A;  . COLONOSCOPY WITH PROPOFOL N/A 03/07/2018   Procedure: COLONOSCOPY WITH PROPOFOL;  Surgeon: [name], MD;  Location: [facility];  Service: Endoscopy;  Laterality: N/A;  . ESOPHAGOGASTRODUODENOSCOPY (EGD) WITH PROPOFOL N/A 10/01/2017   Procedure: ESOPHAGOGASTRODUODENOSCOPY (EGD) WITH PROPOFOL;  Surgeon: Doran Stabler, MD;  Location: [facility];  Service: Gastroenterology;  Laterality: N/A;  . EYE SURGERY Bilateral   . FINGER SURGERY Right    right thumb  . HARDWARE REMOVAL Right 05/15/2016   Procedure: Right Lumbar Two-Lumbar Five Removal  of Hardware;  Surgeon: Kristeen Miss, MD;  Location: [facility];  Service: Neurosurgery;  Laterality: Right;  Right L2-5 removal of hardware  . INGUINAL HERNIA REPAIR     x4  . IR RADIOLOGIST EVAL & MGMT  07/10/2018  . IR RADIOLOGIST EVAL & MGMT  09/05/2018  . IR SACROPLASTY BILATERAL  07/15/2018  . REPAIR SPIGELIAN HERNIA  2011  . [place] ,2014  . TONSILLECTOMY    . UPPER GASTROINTESTINAL ENDOSCOPY    . VASECTOMY       reports that he has never smoked. He has never used smokeless tobacco. He reports that he does not drink alcohol or use drugs.  Allergies  Allergen Reactions  .  Celebrex [Celecoxib] Other (See Comments)    Created stomach ulcers.  Marland Kitchen Flexeril [Cyclobenzaprine] Other (See Comments)    Caught in hiatal hernia and caused extreme pain    Family History  Problem Relation Age of Onset  . Prostate cancer Father   . CAD Mother   . Arthritis-Osteo Brother   . Colon cancer Neg Hx   . Stomach cancer Neg Hx     Prior to Admission medications   Medication Sig Start Date End Date Taking? Authorizing Provider  acetaminophen (TYLENOL) 500 MG tablet Take 500 mg by mouth every 6 (six) hours as needed.    [provider]  buPROPion (WELLBUTRIN SR) 150 MG 12 hr tablet Take 150 mg by mouth 2 (two) times daily.  06/28/11   [provider]  carboxymethylcellul-glycerin (REFRESH REPAIR) 0.5-0.9 % ophthalmic solution Place 1 drop into both eyes 3 (three) times daily.    [provider]  cholecalciferol (VITAMIN D) 1000 UNITS tablet Take 1,000 Units by mouth daily.    [provider]  doxycycline (VIBRA-TABS) 100 MG tablet Take 1 tablet (100 mg total) by mouth 2 (two) times daily. 03/21/19   Aundra Dubin, PA-C  ferrous sulfate 325 (65 FE) MG EC tablet Take 325 mg by mouth daily with breakfast.    [provider]  gabapentin (NEURONTIN) 300 MG capsule Take 1 tablet PO at 4 PM  and 1 tablet at 10 PM 06/20/16   Ward Givens, NP  HYDROcodone-acetaminophen (NORCO) 5-325 MG tablet Take 1-2 tablets by mouth every 6 (six) hours as needed. 02/05/19   Leandrew Koyanagi, MD  HYDROcodone-acetaminophen (NORCO/VICODIN) 5-325 MG tablet Take 1 tablet by mouth every 6 (six) hours as needed for moderate pain. 05/29/19   Jessy Oto, MD  lovastatin (MEVACOR) 20 MG tablet Take 10 mg by mouth at bedtime.  07/22/12   [provider]  Magnesium 250 MG TABS Take 250 mg by mouth daily.     [provider]  methocarbamol (ROBAXIN) 500 MG tablet Take 500 mg by mouth every 6 (six) hours as needed for muscle spasms.     [provider]  Multiple Vitamins-Minerals (CENTRUM ADULTS PO) Take 1 tablet by mouth daily.    [provider]  Multiple Vitamins-Minerals (PRESERVISION AREDS 2 PO) Take 1 capsule by mouth 2 (two) times daily.    [provider]  omeprazole (PRILOSEC) 40 MG capsule Take 1 capsule (40 mg total) by mouth 2 (two) times daily. 12/12/18   [name], MD  propranolol (INDERAL) 10 MG tablet Take 10 mg by mouth daily.     [provider]  rOPINIRole (REQUIP) 1 MG tablet TAKE 2 TABLETS(2 MG) BY MOUTH AT BEDTIME 01/27/19   Dohmeier, Asencion Partridge, MD  rOPINIRole (REQUIP) 4 MG tablet Take 4 mg by mouth at bedtime.    [provider]  sulfamethoxazole-trimethoprim (BACTRIM DS) 800-160 MG tablet Take 1 tablet by mouth 2 (two) times daily. 03/21/19   Aundra Dubin, PA-C  sulfamethoxazole-trimethoprim (BACTRIM DS) 800-160 MG tablet Take 1 tablet by mouth 2 (two) times daily. 04/03/19   Leandrew Koyanagi, MD  Tamsulosin HCl (FLOMAX) 0.4 MG CAPS Take 0.4 mg by mouth 2 (two) times daily.  08/30/11   [provider]  traMADol (ULTRAM) 50 MG tablet Take 100 mg by mouth 2 (two) times daily. Hold for sedation    [provider]    Physical Exam: Vitals:   01/12/20 1955 01/12/20 2300  BP: 116/65 (!) 127/45  Pulse: 78 75  Resp: 16 17  Temp: 98 F (36.7 C)   TempSrc: Oral   SpO2: 100% 96%      Constitutional: NAD, calm, comfortable Vitals:   01/12/20 1955 01/12/20 2300  BP: 116/65 (!) 127/45  Pulse: 78 75  Resp: 16 17  Temp: 98 F (36.7 C)   TempSrc: Oral   SpO2: 100% 96%   Eyes: PERRL, lids and conjunctivae normal ENMT: Mucous membranes are moist. Posterior pharynx clear of any exudate or lesions.Normal dentition.  Neck: normal, supple, no masses, no thyromegaly Respiratory: clear to auscultation bilaterally, no wheezing, no crackles. Normal respiratory effort. No accessory muscle use.  Cardiovascular: Regular rate and rhythm, no murmurs / rubs /  gallops. No extremity edema. 2+ pedal pulses. No carotid bruits.  Abdomen: no tenderness, no masses palpated. No hepatosplenomegaly. Bowel sounds positive.  Musculoskeletal: no clubbing / cyanosis. No joint deformity upper and lower extremities. Good ROM, no contractures. Normal muscle tone.  Skin: no rashes, lesions, ulcers. No induration Neurologic: CN 2-12 grossly intact. Sensation intact, DTR normal. Strength 5/5 in all 4.  Psychiatric: Normal judgment and insight. Alert and oriented x 3. Normal mood.    Labs on Admission: I have personally reviewed following labs and imaging studies  CBC: Recent Labs  Lab 01/12/20 2110  WBC 16.1*  HGB 13.1  HCT 39.9  MCV 90.7  PLT [id number]   Basic Metabolic Panel: Recent Labs  Lab 01/12/20 2110  NA 134*  K 3.2*  CL 103  CO2 21*  GLUCOSE 109*  BUN 18  CREATININE 0.94  CALCIUM 8.2*   GFR: CrCl cannot be calculated (Unknown ideal weight.). Liver Function Tests: Recent Labs  Lab 01/12/20 2110  AST 38  ALT 18  ALKPHOS 76  BILITOT 0.6  PROT 6.5  ALBUMIN 3.0*   Recent Labs  Lab 01/12/20 2110  LIPASE 15   No results for input(s): AMMONIA in the last 168 hours. Coagulation Profile: No results for input(s): INR, PROTIME in the last 168 hours. Cardiac Enzymes: No results for input(s): CKTOTAL, CKMB, CKMBINDEX, TROPONINI in the last 168 hours. BNP (last 3 results) No results for input(s): PROBNP in the last 8760 hours. HbA1C: No results for input(s): HGBA1C in the last 72 hours. CBG: No results for input(s): GLUCAP in the last 168 hours. Lipid Profile: No results for input(s): CHOL, HDL, LDLCALC, TRIG, CHOLHDL, LDLDIRECT in the last 72 hours. Thyroid Function Tests: No results for input(s): TSH, T4TOTAL, FREET4, T3FREE, THYROIDAB in the last 72 hours. Anemia Panel: No results for input(s): VITAMINB12, FOLATE, FERRITIN, TIBC, IRON, RETICCTPCT in the last 72 hours. Urine analysis:    Component Value Date/Time   COLORURINE  AMBER (A) 01/12/2020 2004   APPEARANCEUR CLOUDY (A) 01/12/2020  2004   LABSPEC 1.023 01/12/2020 2004   LABSPEC 1.015 01/04/2017 1627   PHURINE 5.0 01/12/2020 2004   GLUCOSEU NEGATIVE 01/12/2020 2004   GLUCOSEU Negative 01/04/2017 1627   HGBUR MODERATE (A) 01/12/2020 2004   BILIRUBINUR NEGATIVE 01/12/2020 2004   BILIRUBINUR Negative 01/04/2017 1627   KETONESUR 20 (A) 01/12/2020 2004   PROTEINUR 100 (A) 01/12/2020 2004   UROBILINOGEN 0.2 01/04/2017 1627   NITRITE POSITIVE (A) 01/12/2020 2004   LEUKOCYTESUR LARGE (A) 01/12/2020 2004   LEUKOCYTESUR 2+ 08/03/2018 0000   Sepsis Labs: !!!!!!!!!!!!!!!!!!!!!!!!!!!!!!!!!!!!!!!!!!!! @LABRCNTIP (procalcitonin:4,lacticidven:4) )No results found for this or any previous visit (from the past 240 hour(s)).   Radiological Exams on Admission: No results found.  Old chart reviewed Case discussed with EDP  Assessment/Plan 86 year old male comes in with weakness, decreased p.o. intake and diarrhea found to have a UTI  Principal Problem:   Acute lower UTI-placed on IV Rocephin.  Blood pressure stable.  Patient not septic.  Follow-up on urine culture.  Gentle IV fluids.  Lactic acid pending.  Active Problems:   Essential hypertension-clarify and resume home meds    Chronic pain syndrome-continue hydrocodone    CAD (coronary artery disease)-stable    Mild cognitive impairment-patient currently has normal mental status and is fully oriented    Weakness generalized-secondary to UTI possibly.  Covid screen is pending    DVT prophylaxis: SCDs Code Status: Full Family Communication: None Disposition Plan: 1 to 2 days Consults called: None Admission status: Observation   Charles [name] A MD [facility]  If 7PM-7AM, please contact night-coverage [email or web address] Password [facility]  01/13/2020, 12:24 AM

## 2020-01-13 NOTE — Evaluation (Signed)
Physical Therapy Evaluation Patient Details Name: Charles [name] MRN: [id number] DOB: 29-Aug-1931 Today's Date: 01/13/2020   History of Present Illness  Pt is an 86 year old male with PMHx significant for right foot toe amputations, chronic diarrhea, multiple back surgeries and chronic back pain, sleep apnea and admitted for UTI with chronic indwelling Foley catheter  Clinical Impression  Pt admitted with above diagnosis.  Pt currently with functional limitations due to the deficits listed below (see PT Problem List). Pt will benefit from skilled PT to increase their independence and safety with mobility to allow discharge to the venue listed below.   Pt reports feeling very weak however agreeable to attempt mobility as able.  Pt requesting to use BSC upon sitting EOB.  Pt assisted with min assist to [facility] with pivot and nurse tech aware pt wished to remain on [facility] for a few minutes.  Pt would benefit from SNF upon d/c (typically lives at [facility]).      Follow Up Recommendations SNF    Equipment Recommendations  None recommended by PT    Recommendations for Other Services       Precautions / Restrictions Precautions Precautions: Fall Precaution Comments: indwelling foley      Mobility  Bed Mobility Overal bed mobility: Needs Assistance Bed Mobility: Rolling;Sidelying to Sit Rolling: Min guard Sidelying to sit: Min guard       General bed mobility comments: increased time and effort, pt reports chronic back pain so encouraged log roll technique  Transfers Overall transfer level: Needs assistance Equipment used: Rolling walker (2 wheeled) Transfers: Sit to/from [facility] Sit to Stand: Min assist Stand pivot transfers: Min assist       General transfer comment: pt holding RW and BSC with increased forward flexion, transfer to [facility]; assist to guide hips onto surface, pt reports increased weakness however wanted to remain on BSC for awhile so propped BSC in front  of bed so pt could lean forward without falling out of [facility] (nurse tech also notified) pt aware to use call bell when finished  Ambulation/Gait                Stairs            Wheelchair Mobility    Modified Rankin (Stroke Patients Only)       Balance                                             Pertinent Vitals/Pain Pain Assessment: No/denies pain    Home Living Family/patient expects to be discharged to:: Private residence Living Arrangements: Alone   Type of Home: Independent living facility Home Access: Level entry     Home Layout: One level Home Equipment: Environmental consultant - 2 wheels      Prior Function Level of Independence: Independent with assistive device(s)               Hand Dominance        Extremity/Trunk Assessment        Lower Extremity Assessment Lower Extremity Assessment: Generalized weakness       Communication   Communication: No difficulties  Cognition Arousal/Alertness: Awake/alert Behavior During Therapy: WFL for tasks assessed/performed Overall Cognitive Status: Within Functional Limits for tasks assessed  General Comments      Exercises     Assessment/Plan    PT Assessment Patient needs continued PT services  PT Problem List Decreased strength;Decreased mobility;Decreased activity tolerance;Decreased balance;Decreased knowledge of use of DME;Decreased safety awareness       PT Treatment Interventions DME instruction;Therapeutic exercise;Gait training;Balance training;Functional mobility training;Therapeutic activities;Patient/family education    PT Goals (Current goals can be found in the Care Plan section)  Acute Rehab PT Goals PT Goal Formulation: With patient Time For Goal Achievement: 01/27/20 Potential to Achieve Goals: Good    Frequency Min 2X/week   Barriers to discharge        Co-evaluation               AM-PAC PT  "6 Clicks" Mobility  Outcome Measure Help needed turning from your back to your side while in a flat bed without using bedrails?: A Little Help needed moving from lying on your back to sitting on the side of a flat bed without using bedrails?: A Little Help needed moving to and from a bed to a chair (including a wheelchair)?: A Little Help needed standing up from a chair using your arms (e.g., wheelchair or bedside chair)?: A Little Help needed to walk in hospital room?: A Lot Help needed climbing 3-5 steps with a railing? : A Lot 6 Click Score: 16    End of Session   Activity Tolerance: Patient tolerated treatment well Patient left: with call bell/phone within reach;Other (comment)(on BSC) Nurse Communication: (nurse tech aware pt on BSC and to use call bell) PT Visit Diagnosis: Difficulty in walking, not elsewhere classified (R26.2);Muscle weakness (generalized) (M62.81)    Time: [id number] PT Time Calculation (min) (ACUTE ONLY): 23 min   Charges:   PT Evaluation $PT Eval Low Complexity: 1 Low         Kati PT, DPT Acute Rehabilitation Services Office: [phone]  Trena Platt 01/13/2020, 4:26 PM

## 2020-01-13 NOTE — ED Notes (Signed)
Charles [name], son [phone], would like an update on his father.

## 2020-01-14 DIAGNOSIS — G3184 Mild cognitive impairment, so stated: Secondary | ICD-10-CM

## 2020-01-14 DIAGNOSIS — I2510 Atherosclerotic heart disease of native coronary artery without angina pectoris: Secondary | ICD-10-CM

## 2020-01-14 DIAGNOSIS — R531 Weakness: Secondary | ICD-10-CM

## 2020-01-14 DIAGNOSIS — I10 Essential (primary) hypertension: Secondary | ICD-10-CM

## 2020-01-14 DIAGNOSIS — A0472 Enterocolitis due to Clostridium difficile, not specified as recurrent: Principal | ICD-10-CM

## 2020-01-14 DIAGNOSIS — G894 Chronic pain syndrome: Secondary | ICD-10-CM

## 2020-01-14 LAB — BASIC METABOLIC PANEL
Anion gap: 7 (ref 5–15)
BUN: 11 mg/dL (ref 8–23)
CO2: 19 mmol/L — ABNORMAL LOW (ref 22–32)
Calcium: 7.4 mg/dL — ABNORMAL LOW (ref 8.9–10.3)
Chloride: 109 mmol/L (ref 98–111)
Creatinine, Ser: 0.82 mg/dL (ref 0.61–1.24)
GFR calc Af Amer: >60 mL/min (ref >60)
GFR calc non Af Amer: >60 mL/min (ref >60)
Glucose, Bld: 93 mg/dL (ref 70–99)
Potassium: 3 mmol/L — ABNORMAL LOW (ref 3.5–5.1)
Sodium: 135 mmol/L (ref 135–145)

## 2020-01-14 LAB — CBC WITH DIFFERENTIAL/PLATELET
Abs Immature Granulocytes: 0.08 10*3/uL — ABNORMAL HIGH (ref 0.00–0.07)
Basophils Absolute: 0.1 10*3/uL (ref 0.0–0.1)
Basophils Relative: 0 %
Eosinophils Absolute: 0.4 10*3/uL (ref 0.0–0.5)
Eosinophils Relative: 3 %
HCT: 33 % — ABNORMAL LOW (ref 39.0–52.0)
Hemoglobin: 10.7 g/dL — ABNORMAL LOW (ref 13.0–17.0)
Immature Granulocytes: 1 %
Lymphocytes Relative: 7 %
Lymphs Abs: 0.9 10*3/uL (ref 0.7–4.0)
MCH: 29.7 pg (ref 26.0–34.0)
MCHC: 32.4 g/dL (ref 30.0–36.0)
MCV: 91.7 fL (ref 80.0–100.0)
Monocytes Absolute: 1.1 10*3/uL — ABNORMAL HIGH (ref 0.1–1.0)
Monocytes Relative: 8 %
Neutro Abs: 10.6 10*3/uL — ABNORMAL HIGH (ref 1.7–7.7)
Neutrophils Relative %: 81 %
Platelets: 239 10*3/uL (ref 150–400)
RBC: 3.6 MIL/uL — ABNORMAL LOW (ref 4.22–5.81)
RDW: 14 % (ref 11.5–15.5)
WBC: 13.1 10*3/uL — ABNORMAL HIGH (ref 4.0–10.5)
nRBC: 0 % (ref 0.0–0.2)

## 2020-01-14 LAB — URINE CULTURE

## 2020-01-14 LAB — C DIFFICILE QUICK SCREEN W PCR REFLEX
C Diff antigen: POSITIVE — AB
C Diff interpretation: DETECTED
C Diff toxin: POSITIVE — AB

## 2020-01-14 MED ORDER — VANCOMYCIN 50 MG/ML ORAL SOLUTION
125.0000 mg | Freq: Four times a day (QID) | ORAL | Status: DC
  Administered 2020-01-14 – 2020-01-17 (×12): 125 mg via ORAL
  Filled 2020-01-14 (×15): qty 2.5

## 2020-01-14 MED ORDER — POTASSIUM CHLORIDE CRYS ER 20 MEQ PO TBCR
40.0000 meq | EXTENDED_RELEASE_TABLET | Freq: Once | ORAL | Status: AC
  Administered 2020-01-14: 40 meq via ORAL
  Filled 2020-01-14: qty 2

## 2020-01-14 MED ORDER — ENOXAPARIN SODIUM 40 MG/0.4ML ~~LOC~~ SOLN
40.0000 mg | SUBCUTANEOUS | Status: DC
  Administered 2020-01-14 – 2020-01-16 (×3): 40 mg via SUBCUTANEOUS
  Filled 2020-01-14 (×3): qty 0.4

## 2020-01-14 NOTE — Progress Notes (Addendum)
PROGRESS NOTE  Charles [name] [id number] DOB: Jun 14, 1931 DOA: 01/12/2020 PCP: Leanna Battles, MD   LOS: 1 day   Brief narrative: As per HPI,  Charles [name] is a 86 y.o. male with medical history significant of AVM, hypertension, obstructive sleep apnea comes in with 4 to 5 days of profound weakness and diarrhea.  Patient does have a history of chronic diarrhea but over the last 4 days it is been much worse than normal.  He denies any fevers.  He has not been eating and drinking very well.  He has been feeling awful.  Denied any nausea or vomiting/ abdominal pain.  Patient found to have a UTI and referred for admission for such.  Covid test was negative.  During hospitalization patient did have episodes of diarrhea.  C. difficile was positive.  Assessment/Plan:  Principal Problem:   Acute lower UTI Active Problems:   Essential hypertension   Chronic pain syndrome   CAD (coronary artery disease)   Mild cognitive impairment   Weakness generalized   UTI (urinary tract infection)  C. difficile diarrhea.  Multiple episodes.  We will start the patient on oral vancomycin.   Chronic diarrhea. Ongoing for almost 18 months.  Patient stated that he was supposed to follow-up with GI as outpatient for GI related issues.  Gram positive cocci in clusters in 2/4.  Likely to be a contaminant as above pharmacy.  Spoke with pharmacy about it.  We will discontinue antibiotics at this time.  Presumed lower UTI-on IV Rocephin.    Urine culture showed multiple species and suggest recollection.  Will send urine culture again.  No evidence of sepsis.  Will discontinue Rocephin.  Chronic indwelling catheter. Next due for replacement on 01/31/20  Hypokalemia.  On IV potassium with normal saline.  We will add p.o. potassium as well.  Magnesium levels in a.m. check electrolytes closely   Essential hypertension-continue propranolol,   Chronic pain syndrome-continue hydrocodone, Robaxin,  gabapentin   CAD stable.  No chest pain.  Mild cognitive impairment-oriented and communicative.  Patient's son stated that he has memory issues.  Generalized weakness.  COVID-19 was negative.  Likely secondary to diarrhea and volume depletion.  Has been seen by physical therapy who recommended skilled nursing facility placement.  Transition of care on board.  History prostate cancer. Was treated with radiation in the past (as per son). The son states that after this treatment he had treatment with bladder and with GI issues. Could be component of proctitis.   VTE Prophylaxis: Lovenox subcu  Code Status: Full code  Family Communication: I spoke with the patient's son Mr Cecilie Lowers at length and updated her about the clinical condition of the patient.  Disposition Plan:  . Patient is from independent living facility. . Likely disposition to skilled nursing facility as per PT recommendation.  Consult transition of care.  Patient's wife lives also at [facility].  Marland Kitchen Barriers to discharge: Hypokalemia, ongoing diarrhea, pending clinical improvement.  Possible skilled nursing facility placement  Consultants:  None  Procedures:  None  Antibiotics:  . Rocephin 01/13/20>  Anti-infectives (From admission, onward)   Start     Dose/Rate Route Frequency Ordered Stop   01/13/20 2200  cefTRIAXone (ROCEPHIN) 1 g in sodium chloride 0.9 % 100 mL IVPB     1 g 200 mL/hr over 30 Minutes Intravenous Every 24 hours 01/13/20 0024     01/12/20 2345  cefTRIAXone (ROCEPHIN) 2 g in sodium chloride 0.9 % 100 mL IVPB  2 g 200 mL/hr over 30 Minutes Intravenous  Once 01/12/20 2331 01/13/20 0031     Subjective: Today, patient was seen and examined at bedside.  Complaints of diarrhea and poor appetite.  No abdominal pain, no fever, or rigor.  Objective: Vitals:   01/13/20 2155 01/14/20 0517  BP: (!) 114/45 (!) 110/48  Pulse: 60 61  Resp:    Temp: 99.1 F (37.3 C) 98.2 F (36.8 C)  SpO2: 96% 97%     Intake/Output Summary (Last 24 hours) at 01/14/2020 0824 Last data filed at 01/14/2020 0100 Gross per 24 hour  Intake 1916.08 ml  Output 725 ml  Net 1191.08 ml   Filed Weights   01/13/20 1050  Weight: 73.5 kg   Body mass index is 25.38 kg/m.   Physical Exam:  GENERAL: Patient is alert awake and communicative.  Not in obvious distress.  Hard of hearing HENT: No scleral pallor or icterus. Pupils equally reactive to light. Oral mucosa is dry NECK: is supple, no gross swelling noted. CHEST: Clear to auscultation. No crackles or wheezes.  Diminished breath sounds bilaterally. CVS: S1 and S2 heard, no murmur. Regular rate and rhythm.  ABDOMEN: Soft, non-tender, bowel sounds are present.  Chronic Foley catheter in place. EXTREMITIES: No edema. CNS: Cranial nerves are intact. No focal motor deficits. SKIN: warm and dry without rashes.  Data Review: I have personally reviewed the following laboratory data and studies,  CBC: Recent Labs  Lab 01/12/20 2110 01/13/20 0855 01/14/20 0305  WBC 16.1* 12.6* 13.1*  NEUTROABS  --   --  10.6*  HGB 13.1 13.1 10.7*  HCT 39.9 39.5 33.0*  MCV 90.7 91.2 91.7  PLT 232 247 [id number]   Basic Metabolic Panel: Recent Labs  Lab 01/12/20 2110 01/13/20 0855 01/14/20 0305  NA 134* 136 135  K 3.2* 2.9* 3.0*  CL 103 107 109  CO2 21* 17* 19*  GLUCOSE 109* 103* 93  BUN 18 15 11   CREATININE 0.94 0.90 0.82  CALCIUM 8.2* 7.9* 7.4*   Liver Function Tests: Recent Labs  Lab 01/12/20 2110  AST 38  ALT 18  ALKPHOS 76  BILITOT 0.6  PROT 6.5  ALBUMIN 3.0*   Recent Labs  Lab 01/12/20 2110  LIPASE 15   No results for input(s): AMMONIA in the last 168 hours. Cardiac Enzymes: No results for input(s): CKTOTAL, CKMB, CKMBINDEX, TROPONINI in the last 168 hours. BNP (last 3 results) No results for input(s): BNP in the last 8760 hours.  ProBNP (last 3 results) No results for input(s): PROBNP in the last 8760 hours.  CBG: No results for  input(s): GLUCAP in the last 168 hours. Recent Results (from the past 240 hour(s))  Urine culture     Status: Abnormal   Collection Time: 01/12/20 11:31 PM   Specimen: Urine, Clean Catch  Result Value Ref Range Status   Specimen Description   Final    URINE, CLEAN CATCH Performed at [facility], [place] [address] [place], [place] [address]    Special Requests   Final    NONE Performed at [facility], [place] [address] [place], [place] [address]    Culture MULTIPLE SPECIES PRESENT, SUGGEST RECOLLECTION (A)  Final   Report Status 01/14/2020 FINAL  Final  SARS CORONAVIRUS 2 (TAT 6-24 HRS) Nasopharyngeal Nasopharyngeal Swab     Status: None   Collection Time: 01/13/20  2:11 AM   Specimen: Nasopharyngeal Swab  Result Value Ref Range Status   SARS Coronavirus  2 NEGATIVE NEGATIVE Final    Comment: (NOTE) SARS-CoV-2 target nucleic acids are NOT DETECTED. The SARS-CoV-2 RNA is generally detectable in upper and lower respiratory specimens during the acute phase of infection. Negative results do not preclude SARS-CoV-2 infection, do not rule out co-infections with other pathogens, and should not be used as the sole basis for treatment or other patient management decisions. Negative results must be combined with clinical observations, patient history, and epidemiological information. The expected result is Negative. Fact Sheet for Patients: SugarRoll.be Fact Sheet for Healthcare Providers: https://www.woods-mathews.com/ This test is not yet approved or cleared by the [place] FDA and  has been authorized for detection and/or diagnosis of SARS-CoV-2 by FDA under an Emergency Use Authorization (EUA). This EUA will remain  in effect (meaning this test can be used) for the duration of the COVID-19 declaration under Section 56 4(b)(1) of the Act, 21 U.S.C. section 360bbb-3(b)(1), unless the authorization is  terminated or revoked sooner. Performed at [facility], [place] [address] [place], [place] [address]      Studies: No results found.    Flora Lipps, MD  [facility] 01/14/2020

## 2020-01-14 NOTE — Progress Notes (Signed)
CRITICAL VALUE ALERT  Critical Value:  C-Diff positive for antigen and toxin  Date & Time Notied:  01/14/20 @ 1030  Provider Notified: yes  Orders Received/Actions taken: Awaiting

## 2020-01-14 NOTE — Progress Notes (Signed)
PHARMACY - PHYSICIAN COMMUNICATION CRITICAL VALUE ALERT - BLOOD CULTURE IDENTIFICATION (BCID)  Charles [name] is an 86 y.o. male who presented to [facility] on 01/12/2020 with a chief complaint of weakness and diarrhea.  Assessment: BCID + 1/4 GPC in clusters. C.diff antigen and toxin + as well.  Name of physician (or Provider) Contacted: Dr. Louanne Belton paged at 10:42  Current antibiotics: Ceftriaxone 1 g IV daily for empiric treatment of UTI  Changes to prescribed antibiotics recommended: BCID 1/4 possibly a contaminant. Recommend discontinue IV antibiotics and monitor cultures and initiate PO vancomycin.   No results found for this or any previous visit.  Lenis Noon, PharmD 01/14/2020  10:44 AM

## 2020-01-15 ENCOUNTER — Inpatient Hospital Stay (HOSPITAL_COMMUNITY): Payer: Medicare Other

## 2020-01-15 DIAGNOSIS — M62838 Other muscle spasm: Secondary | ICD-10-CM

## 2020-01-15 LAB — URINE CULTURE: Culture: NO GROWTH

## 2020-01-15 LAB — CBC WITH DIFFERENTIAL/PLATELET
Abs Immature Granulocytes: 0.14 10*3/uL — ABNORMAL HIGH (ref 0.00–0.07)
Basophils Absolute: 0 10*3/uL (ref 0.0–0.1)
Basophils Relative: 0 %
Eosinophils Absolute: 0.2 10*3/uL (ref 0.0–0.5)
Eosinophils Relative: 1 %
HCT: 33.9 % — ABNORMAL LOW (ref 39.0–52.0)
Hemoglobin: 11.2 g/dL — ABNORMAL LOW (ref 13.0–17.0)
Immature Granulocytes: 1 %
Lymphocytes Relative: 5 %
Lymphs Abs: 0.9 10*3/uL (ref 0.7–4.0)
MCH: 30.1 pg (ref 26.0–34.0)
MCHC: 33 g/dL (ref 30.0–36.0)
MCV: 91.1 fL (ref 80.0–100.0)
Monocytes Absolute: 1.1 10*3/uL — ABNORMAL HIGH (ref 0.1–1.0)
Monocytes Relative: 6 %
Neutro Abs: 15.7 10*3/uL — ABNORMAL HIGH (ref 1.7–7.7)
Neutrophils Relative %: 87 %
Platelets: 256 10*3/uL (ref 150–400)
RBC: 3.72 MIL/uL — ABNORMAL LOW (ref 4.22–5.81)
RDW: 14.5 % (ref 11.5–15.5)
WBC: 18 10*3/uL — ABNORMAL HIGH (ref 4.0–10.5)
nRBC: 0 % (ref 0.0–0.2)

## 2020-01-15 LAB — BASIC METABOLIC PANEL
Anion gap: 8 (ref 5–15)
BUN: 11 mg/dL (ref 8–23)
CO2: 16 mmol/L — ABNORMAL LOW (ref 22–32)
Calcium: 7.6 mg/dL — ABNORMAL LOW (ref 8.9–10.3)
Chloride: 111 mmol/L (ref 98–111)
Creatinine, Ser: 0.74 mg/dL (ref 0.61–1.24)
GFR calc Af Amer: >60 mL/min (ref >60)
GFR calc non Af Amer: >60 mL/min (ref >60)
Glucose, Bld: 98 mg/dL (ref 70–99)
Potassium: 3.9 mmol/L (ref 3.5–5.1)
Sodium: 135 mmol/L (ref 135–145)

## 2020-01-15 LAB — MAGNESIUM: Magnesium: 1.8 mg/dL (ref 1.7–2.4)

## 2020-01-15 MED ORDER — DICLOFENAC SODIUM 1 % EX GEL
2.0000 g | Freq: Four times a day (QID) | CUTANEOUS | Status: DC
  Administered 2020-01-15 – 2020-01-17 (×9): 2 g via TOPICAL
  Filled 2020-01-15: qty 100

## 2020-01-15 NOTE — Progress Notes (Addendum)
PROGRESS NOTE  Charles [name] [id number] DOB: [birth year] DOA: 01/12/2020 PCP: Leanna Battles, MD   LOS: 2 days   Brief narrative: As per HPI,  Charles [name] is a 86 y.o. male with medical history significant of AVM, hypertension, obstructive sleep apnea presented to the hospital with 4 to 5 days of profound weakness and diarrhea.  Patient does have a history of chronic diarrhea but over the last 4 days it was much worse than normal.  He denied any fevers.  He had not been eating and drinking very well and had been feeling awful.  Denied any nausea or vomiting/ abdominal pain.  Patient found to have a UTI and referred for admission for such.  Covid test was negative.  During hospitalization ,patient did have episodes of diarrhea.  C. difficile was positive.  Assessment/Plan:  Principal Problem:   Acute lower UTI Active Problems:   Essential hypertension   Chronic pain syndrome   CAD (coronary artery disease)   Mild cognitive impairment   Weakness generalized   UTI (urinary tract infection)  C. difficile diarrhea.  present on admission. on oral vancomycin.  Improving.  Neck pain likely secondary to neck spasm.  Will try warm compress, add diclofenac gel.  Continue muscle relaxant.  Chronic diarrhea. Ongoing for almost 18 months.  Patient's family stated that he was supposed to follow-up with GI as outpatient for GI related issues.  I have encouraged the family to keep that appointment.  Gram positive cocci in clusters in 2/4.  Likely to be a contaminant as per pharmacy.  Off antibiotics.  Presumed lower UTI on admission received IV Rocephin.    Looks like  colonization to me at this time.  Urine culture showed multiple species and suggest recollection.  Urine culture was sent again on 01/14/2020 with no growth so far.  Off Rocephin.  Chronic indwelling catheter. Next due for replacement on 01/31/20.  Patient changes catheter every 30 days.  Hypokalemia.  On IV potassium  with normal saline.  We will continue for now.  Potassium this morning is 3.9.  Magnesium of 1.8.   Essential hypertension-continue propranolol, blood pressure is stable at this time.   Chronic pain syndrome-continue hydrocodone, Robaxin, gabapentin   CAD stable.  No chest pain.  No other issues  Mild cognitive impairment-oriented and communicative.  Patient's son stated that he has memory issues.  Generalized weakness.  COVID-19 was negative.  Likely secondary to diarrhea and volume depletion.  Has been seen by physical therapy who recommended skilled nursing facility placement.  Transition of care on board.  Spoke with the patient's son about it as well  History prostate cancer. Was treated with radiation in the past (as per son). The son states that after this treatment, he had treatment with bladder and with GI issues.  There could be component of proctitis.   VTE Prophylaxis: Lovenox subcu  Code Status: Full code  Family Communication: None today.  Spoke with the patient's son yesterday at length.   Disposition Plan:  . Patient is from independent living facility. . Likely disposition to skilled nursing facility as per PT recommendation likely in 1 to 2 days.  Spoke with transition of care. . Barriers to discharge: Skilled nursing facility placement  Consultants:  None  Procedures:  None  Antibiotics:  . Rocephin 01/12/20>01/14/20  Anti-infectives (From admission, onward)   Start     Dose/Rate Route Frequency Ordered Stop   01/14/20 1300  vancomycin (VANCOCIN) 50 mg/mL oral solution 125  mg     125 mg Oral 4 times daily 01/14/20 1158 01/24/20 1359   01/13/20 2200  cefTRIAXone (ROCEPHIN) 1 g in sodium chloride 0.9 % 100 mL IVPB  Status:  Discontinued     1 g 200 mL/hr over 30 Minutes Intravenous Every 24 hours 01/13/20 0024 01/14/20 1158   01/12/20 2345  cefTRIAXone (ROCEPHIN) 2 g in sodium chloride 0.9 % 100 mL IVPB     2 g 200 mL/hr over 30 Minutes Intravenous   Once 01/12/20 2331 01/13/20 0031     Subjective: Today, patient denies any nausea vomiting or abdominal pain.  He complains of neck pain and spasms.  He is unsure about the bowel movements.  Nursing staff is reported no bowel movements after 7 this morning but had 2  bowel movements yesterday night..  Complains of poor appetite.  No fever chills.  .  Objective: Vitals:   01/14/20 2031 01/15/20 0521  BP: (!) 109/49 (!) 118/48  Pulse: 66 61  Resp: 17 18  Temp: 98.3 F (36.8 C) 99.1 F (37.3 C)  SpO2: 93% 96%    Intake/Output Summary (Last 24 hours) at 01/15/2020 1003 Last data filed at 01/15/2020 0600 Gross per 24 hour  Intake 2104.79 ml  Output 300 ml  Net 1804.79 ml   Filed Weights   01/13/20 1050 01/15/20 0500  Weight: 73.5 kg 79.1 kg   Body mass index is 27.31 kg/m.   Physical Exam:  GENERAL: Patient is alert awake and communicative.   Hard of hearing HENT: No scleral pallor or icterus. Pupils equally reactive to light. Oral mucosa is moist NECK: Paracervical spasm noted with tenderness on palpation. CHEST: Clear to auscultation. No crackles or wheezes.  Diminished breath sounds bilaterally. CVS: S1 and S2 heard, no murmur. Regular rate and rhythm.  ABDOMEN: Soft, non-tender, bowel sounds are present.  Chronic Foley catheter in place. EXTREMITIES: No edema. CNS: Cranial nerves are intact. No focal motor deficits. SKIN: warm and dry without rashes.  Data Review: I have personally reviewed the following laboratory data and studies,  CBC: Recent Labs  Lab 01/12/20 2110 01/13/20 0855 01/14/20 0305 01/15/20 0240  WBC 16.1* 12.6* 13.1* 18.0*  NEUTROABS  --   --  10.6* 15.7*  HGB 13.1 13.1 10.7* 11.2*  HCT 39.9 39.5 33.0* 33.9*  MCV 90.7 91.2 91.7 91.1  PLT 232 247 239 [id number]   Basic Metabolic Panel: Recent Labs  Lab 01/12/20 2110 01/13/20 0855 01/14/20 0305 01/15/20 0240  NA 134* 136 135 135  K 3.2* 2.9* 3.0* 3.9  CL 103 107 109 111  CO2 21* 17* 19* 16*    GLUCOSE 109* 103* 93 98  BUN 18 15 11 11   CREATININE 0.94 0.90 0.82 0.74  CALCIUM 8.2* 7.9* 7.4* 7.6*  MG  --   --   --  1.8   Liver Function Tests: Recent Labs  Lab 01/12/20 2110  AST 38  ALT 18  ALKPHOS 76  BILITOT 0.6  PROT 6.5  ALBUMIN 3.0*   Recent Labs  Lab 01/12/20 2110  LIPASE 15   No results for input(s): AMMONIA in the last 168 hours. Cardiac Enzymes: No results for input(s): CKTOTAL, CKMB, CKMBINDEX, TROPONINI in the last 168 hours. BNP (last 3 results) No results for input(s): BNP in the last 8760 hours.  ProBNP (last 3 results) No results for input(s): PROBNP in the last 8760 hours.  CBG: No results for input(s): GLUCAP in the last 168 hours. Recent Results (from the  past 240 hour(s))  Urine culture     Status: Abnormal   Collection Time: 01/12/20 11:31 PM   Specimen: Urine, Clean Catch  Result Value Ref Range Status   Specimen Description   Final    URINE, CLEAN CATCH Performed at [facility], [place] [address], [place], [place] [address]    Special Requests   Final    NONE Performed at [facility], [place] [address] [place], [place] [address]    Culture MULTIPLE SPECIES PRESENT, SUGGEST RECOLLECTION (A)  Final   Report Status 01/14/2020 FINAL  Final  SARS CORONAVIRUS 2 (TAT 6-24 HRS) Nasopharyngeal Nasopharyngeal Swab     Status: None   Collection Time: 01/13/20  2:11 AM   Specimen: Nasopharyngeal Swab  Result Value Ref Range Status   SARS Coronavirus 2 NEGATIVE NEGATIVE Final    Comment: (NOTE) SARS-CoV-2 target nucleic acids are NOT DETECTED. The SARS-CoV-2 RNA is generally detectable in upper and lower respiratory specimens during the acute phase of infection. Negative results do not preclude SARS-CoV-2 infection, do not rule out co-infections with other pathogens, and should not be used as the sole basis for treatment or other patient management decisions. Negative results must be combined with  clinical observations, patient history, and epidemiological information. The expected result is Negative. Fact Sheet for Patients: SugarRoll.be Fact Sheet for Healthcare Providers: https://www.woods-mathews.com/ This test is not yet approved or cleared by the [place] FDA and  has been authorized for detection and/or diagnosis of SARS-CoV-2 by FDA under an Emergency Use Authorization (EUA). This EUA will remain  in effect (meaning this test can be used) for the duration of the COVID-19 declaration under Section 56 4(b)(1) of the Act, 21 U.S.C. section 360bbb-3(b)(1), unless the authorization is terminated or revoked sooner. Performed at [facility], [place] [address] [place], [place] [address]   Culture, blood (routine x 2)     Status: None (Preliminary result)   Collection Time: 01/13/20  8:55 AM   Specimen: BLOOD  Result Value Ref Range Status   Specimen Description   Final    BLOOD RIGHT ANTECUBITAL Performed at [place] [address] [place], [place] [address]    Special Requests   Final    BOTTLES DRAWN AEROBIC AND ANAEROBIC Blood Culture results may not be optimal due to an inadequate volume of blood received in culture bottles Performed at [place] [address] [place], [place] [address]    Culture   Final    NO GROWTH < 24 HOURS Performed at [place] [address] [place], [place] [address]    Report Status PENDING  Incomplete  Culture, blood (routine x 2)     Status: None (Preliminary result)   Collection Time: 01/13/20  9:05 AM   Specimen: BLOOD RIGHT FOREARM  Result Value Ref Range Status   Specimen Description   Final    BLOOD RIGHT FOREARM Performed at [place] [address] [place], [place] [address]    Special Requests   Final    BOTTLES DRAWN AEROBIC AND ANAEROBIC Blood Culture adequate volume Performed at [place] [address], [place], [place] [address]    Culture  Setup Time   Final    GRAM POSITIVE COCCI IN CLUSTERS IN BOTH AEROBIC AND ANAEROBIC BOTTLES CRITICAL RESULT CALLED TO, READ BACK BY AND VERIFIED WITH: E. JACKSON PHARMD, AT 1003 01/14/20 BY D. VANHOOK Performed  at [facility], [place] [address], [place], [place] [address]    Culture GRAM POSITIVE COCCI  Final   Report Status PENDING  Incomplete  C difficile quick scan w PCR reflex     Status: Abnormal   Collection Time: 01/14/20  8:23 AM   Specimen: Urine, Clean Catch; Stool  Result Value Ref Range Status   C Diff antigen POSITIVE (A) NEGATIVE Final   C Diff toxin POSITIVE (A) NEGATIVE Final   C Diff interpretation Toxin producing C. difficile detected.  Final    Comment: CRITICAL RESULT CALLED TO, READ BACK BY AND VERIFIED WITH: GUNDLACH,D RN @1024  01/14/20 BILLINGSLEY,L Performed at [facility], [place] [address] [place], [place] [address]   Culture, Urine     Status: None   Collection Time: 01/14/20  8:26 AM   Specimen: Urine, Clean Catch  Result Value Ref Range Status   Specimen Description   Final    URINE, CLEAN CATCH Performed at [facility], [place] [address], [place], [place] [address]    Special Requests   Final    NONE Performed at [facility], [place] [address], [place], [place] [address]    Culture   Final    NO GROWTH Performed at [facility], [place] [address], [place], [place] [address]    Report Status 01/15/2020 FINAL  Final     Studies: No results found.    Flora Lipps, MD  [facility] 01/15/2020

## 2020-01-15 NOTE — Progress Notes (Signed)
Physical Therapy Treatment Patient Details Name: Charles [name] MRN: [id number] DOB: 05-15-1931 Today's Date: 01/15/2020    History of Present Illness Pt is an 86 year old male with PMHx significant for right foot toe amputations, chronic diarrhea, multiple back surgeries and chronic back pain, sleep apnea and admitted for UTI with chronic indwelling Foley catheter    PT Comments    Assisted pt OOB to [facility] and attempt amb.  General bed mobility comments: required increased assist due to cervical pain and overall body stiffness.  Posterior lean and B LE in extension.  Required increased time static sitting EOB to adjust. General transfer comment: required increased assist with difficulty self rising and forward flxing.  Unsteady.  Shaky.  Assisted from elevated bed to [facility] with phtysical Assist to target and control desend.  Required + 2 side by side assist to stand.  Severe posterior lean.General Gait Details: very short, shuffled steps and max c/o weakness with increased fear of falling.  HIGH FALL RISK.   Follow Up Recommendations  SNF     Equipment Recommendations       Recommendations for Other Services       Precautions / Restrictions Precautions Precautions: Fall Precaution Comments: indwelling foley Restrictions Weight Bearing Restrictions: No    Mobility  Bed Mobility Overal bed mobility: Needs Assistance Bed Mobility: Supine to Sit;Sit to Supine     Supine to sit: Max assist;Total assist;+2 for physical assistance;+2 for safety/equipment Sit to supine: Max assist;Total assist;+2 for physical assistance;+2 for safety/equipment   General bed mobility comments: required increased assist due to cervical pain and overall body stiffness.  Posterior lean and B LE in extension.  Required increased time static sitting EOB to adjust.  Transfers Overall transfer level: Needs assistance Equipment used: Rolling walker (2 wheeled) Transfers: Sit to/from [employer] Sit to Stand: Max assist;+2 physical assistance;+2 safety/equipment Stand pivot transfers: Max assist;Total assist;+2 physical assistance;+2 safety/equipment       General transfer comment: required increased assist with difficulty self rising and forward flxing.  Unsteady.  Shaky.  Assisted from elevated bed to [facility] with phtysical Assist to target and control desend.  Required + 2 side by side assist to stand.  Severe posterior lean.  Ambulation/Gait Ambulation/Gait assistance: Max assist;+2 physical assistance;+2 safety/equipment Gait Distance (Feet): 2 Feet Assistive device: Rolling walker (2 wheeled) Gait Pattern/deviations: Step-to pattern;Decreased step length - right;Decreased step length - left;Narrow base of support;Shuffle;Leaning posteriorly Gait velocity: decreased   General Gait Details: very short, shuffled steps and max c/o weakness with increased fear of falling.  HIGH FALL RISK.   Stairs             Wheelchair Mobility    Modified Rankin (Stroke Patients Only)       Balance                                            Cognition Arousal/Alertness: Awake/alert Behavior During Therapy: WFL for tasks assessed/performed Overall Cognitive Status: Within Functional Limits for tasks assessed                                        Exercises      General Comments        Pertinent Vitals/Pain Pain Assessment: Faces Faces Pain  Scale: Hurts little more Pain Location: neck Pain Descriptors / Indicators: Tightness Pain Intervention(s): Monitored during session    Home Living                      Prior Function            PT Goals (current goals can now be found in the care plan section)      Frequency    Min 2X/week      PT Plan Current plan remains appropriate    Co-evaluation              AM-PAC PT "6 Clicks" Mobility   Outcome Measure  Help needed turning from your back to  your side while in a flat bed without using bedrails?: A Lot Help needed moving from lying on your back to sitting on the side of a flat bed without using bedrails?: A Lot Help needed moving to and from a bed to a chair (including a wheelchair)?: A Lot Help needed standing up from a chair using your arms (e.g., wheelchair or bedside chair)?: A Lot Help needed to walk in hospital room?: Total Help needed climbing 3-5 steps with a railing? : Total 6 Click Score: 10    End of Session Equipment Utilized During Treatment: Gait belt Activity Tolerance: Patient tolerated treatment well Patient left: with call bell/phone within reach;Other (comment)   PT Visit Diagnosis: Difficulty in walking, not elsewhere classified (R26.2);Muscle weakness (generalized) (M62.81)     Time: [id number] PT Time Calculation (min) (ACUTE ONLY): 28 min  Charges:  $Gait Training: 8-22 mins $Therapeutic Activity: 8-22 mins                     Rica Koyanagi  PTA Acute  Rehabilitation Services Pager      [phone] Office      [phone]

## 2020-01-16 ENCOUNTER — Ambulatory Visit: Payer: Medicare Other | Admitting: Physician Assistant

## 2020-01-16 LAB — BASIC METABOLIC PANEL
Anion gap: 5 (ref 5–15)
BUN: 11 mg/dL (ref 8–23)
CO2: 18 mmol/L — ABNORMAL LOW (ref 22–32)
Calcium: 7.6 mg/dL — ABNORMAL LOW (ref 8.9–10.3)
Chloride: 112 mmol/L — ABNORMAL HIGH (ref 98–111)
Creatinine, Ser: 0.7 mg/dL (ref 0.61–1.24)
GFR calc Af Amer: >60 mL/min (ref >60)
GFR calc non Af Amer: >60 mL/min (ref >60)
Glucose, Bld: 126 mg/dL — ABNORMAL HIGH (ref 70–99)
Potassium: 4.2 mmol/L (ref 3.5–5.1)
Sodium: 135 mmol/L (ref 135–145)

## 2020-01-16 LAB — CBC WITH DIFFERENTIAL/PLATELET
Abs Immature Granulocytes: 0.19 10*3/uL — ABNORMAL HIGH (ref 0.00–0.07)
Basophils Absolute: 0.1 10*3/uL (ref 0.0–0.1)
Basophils Relative: 0 %
Eosinophils Absolute: 0.3 10*3/uL (ref 0.0–0.5)
Eosinophils Relative: 2 %
HCT: 33.2 % — ABNORMAL LOW (ref 39.0–52.0)
Hemoglobin: 10.7 g/dL — ABNORMAL LOW (ref 13.0–17.0)
Immature Granulocytes: 1 %
Lymphocytes Relative: 5 %
Lymphs Abs: 0.8 10*3/uL (ref 0.7–4.0)
MCH: 30 pg (ref 26.0–34.0)
MCHC: 32.2 g/dL (ref 30.0–36.0)
MCV: 93 fL (ref 80.0–100.0)
Monocytes Absolute: 1.1 10*3/uL — ABNORMAL HIGH (ref 0.1–1.0)
Monocytes Relative: 6 %
Neutro Abs: 14.4 10*3/uL — ABNORMAL HIGH (ref 1.7–7.7)
Neutrophils Relative %: 86 %
Platelets: 252 10*3/uL (ref 150–400)
RBC: 3.57 MIL/uL — ABNORMAL LOW (ref 4.22–5.81)
RDW: 14.6 % (ref 11.5–15.5)
WBC: 16.8 10*3/uL — ABNORMAL HIGH (ref 4.0–10.5)
nRBC: 0 % (ref 0.0–0.2)

## 2020-01-16 LAB — MAGNESIUM: Magnesium: 1.8 mg/dL (ref 1.7–2.4)

## 2020-01-16 LAB — SARS CORONAVIRUS 2 (TAT 6-24 HRS): SARS Coronavirus 2: NEGATIVE

## 2020-01-16 LAB — CULTURE, BLOOD (ROUTINE X 2): Special Requests: ADEQUATE

## 2020-01-16 MED ORDER — PRAVASTATIN SODIUM 20 MG PO TABS
10.0000 mg | ORAL_TABLET | Freq: Every day | ORAL | Status: DC
  Administered 2020-01-16: 18:00:00 10 mg via ORAL
  Filled 2020-01-16: qty 1

## 2020-01-16 MED ORDER — GABAPENTIN 400 MG PO CAPS
400.0000 mg | ORAL_CAPSULE | Freq: Three times a day (TID) | ORAL | Status: DC
  Administered 2020-01-16 – 2020-01-17 (×3): 400 mg via ORAL
  Filled 2020-01-16 (×3): qty 1

## 2020-01-16 NOTE — TOC Progression Note (Addendum)
Transition of Care [facility]) - Progression Note    Patient Details  Name: Charles [name] MRN: [id number] Date of Birth: 08/31/1931  Transition of Care [facility]) CM/SW Contact  Purcell Mouton, RN Phone Number: 01/16/2020, 1:29 PM  Clinical Narrative:    Damaris Schooner with [facility] Admission Coordinator concerning pt discharge plan for [facility] SNF. FL2 as completed and sent.         Expected Discharge Plan and Services                                                 Social Determinants of Health (SDOH) Interventions    Readmission Risk Interventions No flowsheet data found.

## 2020-01-16 NOTE — Progress Notes (Addendum)
PROGRESS NOTE  Charles [name] [id number] DOB: 08-31-1931 DOA: 01/12/2020 PCP: Leanna Battles, MD   LOS: 3 days   Brief narrative: As per HPI,  Charles [name] is a 86 y.o. male with medical history significant of AVM, hypertension, obstructive sleep apnea presented to the hospital with 4 to 5 days of profound weakness and diarrhea.  Patient does have a history of chronic diarrhea but over the last 4 days it was much worse than normal.  He denied any fevers.  He had not been eating and drinking very well and had been feeling awful.  Denied any nausea or vomiting/ abdominal pain.  Patient found to have a UTI and referred for admission for such.  Covid test was negative.  During hospitalization ,patient did have episodes of diarrhea.  C. difficile was positive.  Assessment/Plan:  Principal Problem:   Acute lower UTI Active Problems:   Essential hypertension   Chronic pain syndrome   CAD (coronary artery disease)   Mild cognitive impairment   Weakness generalized   UTI (urinary tract infection)  C. difficile diarrhea.  Improving.  Mild leukocytosis.  No fever or abdominal pain.  Present on admission.  On oral vancomycin.    Last bowel movement yesterday night.  Neck pain likely secondary to neck spasm. Added warm compress, diclofenac gel yesterday.  X-ray of the C-spine showed a severe degenerative joint disease.  Continue muscle relaxant.  Will add soft collar to see if that would benefit him.  Chronic diarrhea. Ongoing for almost 18 months.  Patient's family stated that he was supposed to follow-up with GI as outpatient for GI related issues.  I have encouraged the family to keep that appointment.  Diarrhea at this time has improved after initiation of C. difficile treatment.  Gram positive cocci in clusters in 2/4.  Likely to be a contaminant as per pharmacy.  Off antibiotics.  Presumed lower UTI on admission received IV Rocephin.    Looks like  colonization.  Urine culture  showed multiple species and suggest recollection.  Urine culture was sent again on 01/14/2020 with no growth so far.  Off Rocephin.  Chronic indwelling catheter. Next due for replacement on 01/31/20.  Patient changes catheter every 30 days.  Hypokalemia.  On IV potassium with normal saline.  Will discontinue potassium with IV fluids.  Encourage oral fluids.  Discontinue IV fluids.   Essential hypertension-on propranolol, blood pressure is stable at this time.   Chronic pain syndrome-continue hydrocodone, Robaxin, gabapentin.  Denies pain at this time.   CAD stable.  No chest pain.  No other issues  Mild cognitive impairment-patient is oriented and communicative.  He does have mild memory impairment.  Generalized weakness.  COVID-19 was negative.  Likely secondary to diarrhea and volume depletion.  Seen by physical therapy who recommended skilled nursing facility placement.  Transition of care on board.    History prostate cancer. Was treated with radiation in the past (as per son). The son states that after this treatment, he had treatment with bladder and with GI issues.   VTE Prophylaxis: Lovenox subcu  Code Status: Full code  Family Communication: I spoke with the patient's son Mr Cecilie Lowers on the phone and updated him about the clinical condition.  Disposition Plan:  . Patient is from independent living facility. . Likely disposition to skilled nursing facility as per PT recommendation likely in 1 to 2 days.  I again spoke with transition of care today. . Patient is medically stable for disposition.Marland Kitchen Marland Kitchen  Barriers to discharge: Skilled nursing facility placement pending  Consultants:  None  Procedures:  None  Antibiotics:  . Rocephin 01/12/20>01/14/20  Anti-infectives (From admission, onward)   Start     Dose/Rate Route Frequency Ordered Stop   01/14/20 1300  vancomycin (VANCOCIN) 50 mg/mL oral solution 125 mg     125 mg Oral 4 times daily 01/14/20 1158 01/24/20 1359    01/13/20 2200  cefTRIAXone (ROCEPHIN) 1 g in sodium chloride 0.9 % 100 mL IVPB  Status:  Discontinued     1 g 200 mL/hr over 30 Minutes Intravenous Every 24 hours 01/13/20 0024 01/14/20 1158   01/12/20 2345  cefTRIAXone (ROCEPHIN) 2 g in sodium chloride 0.9 % 100 mL IVPB     2 g 200 mL/hr over 30 Minutes Intravenous  Once 01/12/20 2331 01/13/20 0031     Subjective: Today, patient still complains of neck pain.  No nausea vomiting or abdominal pain.  Had a bowel movement yesterday.  Has generalized weakness.  No fever or chills.  Has low appetite.  Objective: Vitals:   01/15/20 2103 01/16/20 0627  BP: 129/62 (!) 124/50  Pulse: 62 100  Resp: 20 18  Temp: 98.8 F (37.1 C) 98.9 F (37.2 C)  SpO2: 98% 96%    Intake/Output Summary (Last 24 hours) at 01/16/2020 1051 Last data filed at 01/16/2020 [id number] Gross per 24 hour  Intake 1611.66 ml  Output 600 ml  Net 1011.66 ml   Filed Weights   01/13/20 1050 01/15/20 0500 01/16/20 0627  Weight: 73.5 kg 79.1 kg 77.6 kg   Body mass index is 26.79 kg/m.   Physical Exam:  GENERAL: Patient is alert awake and communicative.   Hard of hearing HENT: No scleral pallor or icterus. Pupils equally reactive to light. Oral mucosa is moist NECK: Paracervical spasm noted with tenderness on palpation. CHEST: Clear to auscultation. No crackles or wheezes.  Diminished breath sounds bilaterally. CVS: S1 and S2 heard, no murmur. Regular rate and rhythm.  ABDOMEN: Soft, non-tender, bowel sounds are present.  Chronic Foley catheter in place. EXTREMITIES: No edema. CNS: Cranial nerves are intact. No focal motor deficits.  Following commands. SKIN: warm and dry without rashes.  Data Review: I have personally reviewed the following laboratory data and studies,  CBC: Recent Labs  Lab 01/12/20 2110 01/13/20 0855 01/14/20 0305 01/15/20 0240 01/16/20 0243  WBC 16.1* 12.6* 13.1* 18.0* 16.8*  NEUTROABS  --   --  10.6* 15.7* 14.4*  HGB 13.1 13.1 10.7* 11.2*  10.7*  HCT 39.9 39.5 33.0* 33.9* 33.2*  MCV 90.7 91.2 91.7 91.1 93.0  PLT 232 247 239 256 [id number]   Basic Metabolic Panel: Recent Labs  Lab 01/12/20 2110 01/13/20 0855 01/14/20 0305 01/15/20 0240 01/16/20 0243  NA 134* 136 135 135 135  K 3.2* 2.9* 3.0* 3.9 4.2  CL 103 107 109 111 112*  CO2 21* 17* 19* 16* 18*  GLUCOSE 109* 103* 93 98 126*  BUN 18 15 11 11 11   CREATININE 0.94 0.90 0.82 0.74 0.70  CALCIUM 8.2* 7.9* 7.4* 7.6* 7.6*  MG  --   --   --  1.8 1.8   Liver Function Tests: Recent Labs  Lab 01/12/20 2110  AST 38  ALT 18  ALKPHOS 76  BILITOT 0.6  PROT 6.5  ALBUMIN 3.0*   Recent Labs  Lab 01/12/20 2110  LIPASE 15   No results for input(s): AMMONIA in the last 168 hours. Cardiac Enzymes: No results for input(s): CKTOTAL, CKMB,  CKMBINDEX, TROPONINI in the last 168 hours. BNP (last 3 results) No results for input(s): BNP in the last 8760 hours.  ProBNP (last 3 results) No results for input(s): PROBNP in the last 8760 hours.  CBG: No results for input(s): GLUCAP in the last 168 hours. Recent Results (from the past 240 hour(s))  Urine culture     Status: Abnormal   Collection Time: 01/12/20 11:31 PM   Specimen: Urine, Clean Catch  Result Value Ref Range Status   Specimen Description   Final    URINE, CLEAN CATCH Performed at [facility], [place] [address] [place], [place] [address]    Special Requests   Final    NONE Performed at [facility], [place] [address] [place] [address], [place], [place] [address]    Culture MULTIPLE SPECIES PRESENT, SUGGEST RECOLLECTION (A)  Final   Report Status 01/14/2020 FINAL  Final  SARS CORONAVIRUS 2 (TAT 6-24 HRS) Nasopharyngeal Nasopharyngeal Swab     Status: None   Collection Time: 01/13/20  2:11 AM   Specimen: Nasopharyngeal Swab  Result Value Ref Range Status   SARS Coronavirus 2 NEGATIVE NEGATIVE Final    Comment: (NOTE) SARS-CoV-2 target nucleic acids are NOT DETECTED. The SARS-CoV-2 RNA is  generally detectable in upper and lower respiratory specimens during the acute phase of infection. Negative results do not preclude SARS-CoV-2 infection, do not rule out co-infections with other pathogens, and should not be used as the sole basis for treatment or other patient management decisions. Negative results must be combined with clinical observations, patient history, and epidemiological information. The expected result is Negative. Fact Sheet for Patients: SugarRoll.be Fact Sheet for Healthcare Providers: https://www.woods-mathews.com/ This test is not yet approved or cleared by the [place] FDA and  has been authorized for detection and/or diagnosis of SARS-CoV-2 by FDA under an Emergency Use Authorization (EUA). This EUA will remain  in effect (meaning this test can be used) for the duration of the COVID-19 declaration under Section 56 4(b)(1) of the Act, 21 U.S.C. section 360bbb-3(b)(1), unless the authorization is terminated or revoked sooner. Performed at [facility], [place] [address], [place], [place] [address]   Culture, blood (routine x 2)     Status: None (Preliminary result)   Collection Time: 01/13/20  8:55 AM   Specimen: BLOOD  Result Value Ref Range Status   Specimen Description   Final    BLOOD RIGHT ANTECUBITAL Performed at [place] [address] [place], [place] [address]    Special Requests   Final    BOTTLES DRAWN AEROBIC AND ANAEROBIC Blood Culture results may not be optimal due to an inadequate volume of blood received in culture bottles Performed at [place] [address], [place], [place] [address]    Culture   Final    NO GROWTH 3 DAYS Performed at [facility], [place] [address] [place], [place] [address]    Report Status PENDING  Incomplete  Culture, blood (routine x 2)     Status: Abnormal   Collection Time: 01/13/20  9:05 AM   Specimen:  BLOOD RIGHT FOREARM  Result Value Ref Range Status   Specimen Description   Final    BLOOD RIGHT FOREARM Performed at [place] [address], [place], [place] [address]    Special Requests   Final    BOTTLES DRAWN AEROBIC AND ANAEROBIC Blood Culture adequate volume Performed at [place]  [address] [place], [place] [address]    Culture  Setup Time   Final    GRAM POSITIVE COCCI IN CLUSTERS IN BOTH AEROBIC AND ANAEROBIC BOTTLES CRITICAL RESULT CALLED TO, READ BACK BY AND VERIFIED WITH: E. JACKSON PHARMD, AT 1003 01/14/20 BY D. VANHOOK    Culture (A)  Final    STAPHYLOCOCCUS SPECIES (COAGULASE NEGATIVE) THE SIGNIFICANCE OF ISOLATING THIS ORGANISM FROM A SINGLE SET OF BLOOD CULTURES WHEN MULTIPLE SETS ARE DRAWN IS UNCERTAIN. PLEASE NOTIFY THE MICROBIOLOGY DEPARTMENT WITHIN ONE WEEK IF SPECIATION AND SENSITIVITIES ARE REQUIRED. Performed at [facility], [place] [address], [place], [place] [address]    Report Status 01/16/2020 FINAL  Final  C difficile quick scan w PCR reflex     Status: Abnormal   Collection Time: 01/14/20  8:23 AM   Specimen: Urine, Clean Catch; Stool  Result Value Ref Range Status   C Diff antigen POSITIVE (A) NEGATIVE Final   C Diff toxin POSITIVE (A) NEGATIVE Final   C Diff interpretation Toxin producing C. difficile detected.  Final    Comment: CRITICAL RESULT CALLED TO, READ BACK BY AND VERIFIED WITH: GUNDLACH,D RN @1024  01/14/20 BILLINGSLEY,L Performed at [facility], [place] [address], [place], [place] [address]   Culture, Urine     Status: None   Collection Time: 01/14/20  8:26 AM   Specimen: Urine, Clean Catch  Result Value Ref Range Status   Specimen Description   Final    URINE, CLEAN CATCH Performed at [facility], [place] [address], [place], [place] [address]    Special Requests   Final    NONE Performed at [facility], [place] [address],  [place], [place] [address]    Culture   Final    NO GROWTH Performed at [facility], [place] [address], [place], [place] [address]    Report Status 01/15/2020 FINAL  Final     Studies: DG Cervical Spine 2 or 3 views  Result Date: 01/15/2020 CLINICAL DATA:  Neck pain. EXAM: CERVICAL SPINE - 2-3 VIEW COMPARISON:  July 03, 2016. FINDINGS: Mild grade 1 anterolisthesis of C4-5 is noted secondary to posterior facet joint hypertrophy. No definite fracture is noted. No prevertebral soft tissue swelling is noted. Severe degenerative disc disease is noted at C5-6 and C6-7 with anterior osteophyte formation. IMPRESSION: Severe multilevel degenerative disc disease. No acute abnormality seen in the cervical spine. Electronically Signed   By: Marijo Conception M.D.   On: 01/15/2020 13:44      Flora Lipps, MD  [facility] 01/16/2020

## 2020-01-16 NOTE — TOC Progression Note (Signed)
Transition of Care [facility]) - Progression Note    Patient Details  Name: Charles [name] MRN: [id number] Date of Birth: 1931/10/20  Transition of Care [facility]) CM/SW Contact  Purcell Mouton, RN Phone Number: 01/16/2020, 3:51 PM  Clinical Narrative:    For discharge in the AM,  fax discharge summary to [phone] before calling report to [phone] or [phone].         Expected Discharge Plan and Services                                                 Social Determinants of Health (SDOH) Interventions    Readmission Risk Interventions No flowsheet data found.

## 2020-01-16 NOTE — Care Management Important Message (Signed)
Important Message  Patient Details IM Letter given to Gabriel Earing RN Case Manager to present to the Patient Name: Charles [name] MRN: [id number] Date of Birth: 12-15-1930   Medicare Important Message Given:  Yes     Kerin Salen 01/16/2020, 9:43 AM

## 2020-01-16 NOTE — NC FL2 (Signed)
[place] LEVEL OF CARE SCREENING TOOL     IDENTIFICATION  Patient Name: Charles [name] Birthdate: 1931-05-07 Sex: male Admission Date (Current Location): 01/12/2020  [facility] and [place] Number:  Herbalist and Address:  [facility],  [place] [address], [place]      Provider Number: [id number]  Attending Physician Name and Address:  Flora Lipps, MD  Relative Name and Phone Number:  Caid Henline [phone] son    Current Level of Care: Hospital Recommended Level of Care: [place] Prior Approval Number:    Date Approved/Denied:   PASRR Number: [id number]  Discharge Plan: SNF    Current Diagnoses: Patient Active Problem List   Diagnosis Date Noted  . Acute lower UTI 01/13/2020  . Weakness generalized 01/13/2020  . UTI (urinary tract infection) 01/13/2020  . Pain in right elbow 05/30/2019  . Osteomyelitis of second toe of right foot ([place]) 04/03/2019  . Hammertoe of second toe of right foot 04/03/2019  . Ulcer of second toe of right foot ([place]) 03/21/2019  . Osteomyelitis of great toe of right foot ([place]) 12/05/2018  . Sacral insufficiency fracture 07/04/2018  . Unsteady gait 07/04/2018  . Mild cognitive impairment 07/04/2018  . Neuropathy 07/04/2018  . CAD (coronary artery disease) 05/29/2018  . AVM (arteriovenous malformation) of colon   . Radiation proctitis   . Amputation of right great toe ([place]) 03/01/2018  . Diverticulosis of colon with hemorrhage   . Acute blood loss anemia   . History of back surgery 10/03/2017  . Rectal bleeding 10/03/2017  . Hematochezia   . Lower GI bleeding 09/29/2017  . Treatment-emergent central sleep apnea 06/25/2017  . Chronic bilateral low back pain with bilateral sciatica 01/17/2017  . Post laminectomy syndrome 01/17/2017  . Chronic pain syndrome 01/17/2017  . Sacroiliitis ([place]) 01/17/2017  . Piriformis syndrome of right side 01/17/2017  . Malignant  neoplasm of prostate ([place]) 07/19/2016  . Fixation hardware in spine 05/15/2016  . Hypoglycemic reaction 02/02/2015  . Tremor, essential 12/11/2014  . OSA on CPAP 04/24/2014  . 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 Hypersomnia with sleep apnea, unspecified 04/24/2014  . Depression 04/18/2013  . BPH (benign prostatic hyperplasia) 04/18/2013  . Impingement syndrome of right shoulder 04/14/2013  . Scoliosis or kyphoscoliosis 04/08/2013  . Lumbosacral spondylosis without myelopathy 04/08/2013  . Acute respiratory failure ([place]) 04/08/2013  . Essential hypertension 04/08/2013  . Lymphocytosis   . Diaphragmatic hernia 11/21/2010  . DIVERTICULAR DISEASE 11/21/2010  . BARRETT'S ESOPHAGUS 02/22/2010  . GERD 07/20/2008  . INGUINAL HERNIA, LEFT, SMALL 07/20/2008  . OTH VENTRAL HERN W/O MENTION OBSTRUCTION/GANGREN 07/20/2008  . CONSTIPATION 07/20/2008  . DYSPHAGIA 07/20/2008    Orientation RESPIRATION BLADDER Height & Weight     Self, Time, Situation, Place  Normal Continent Weight: 77.6 kg Height:  5\' 7"  (170.2 cm)  BEHAVIORAL SYMPTOMS/MOOD NEUROLOGICAL BOWEL NUTRITION STATUS      Incontinent Diet(Heart Healthy)  AMBULATORY STATUS COMMUNICATION OF NEEDS Skin   Extensive Assist Verbally Other (Comment)(Moisture Buttocks)                       Personal Care Assistance Level of Assistance  Bathing, Feeding, Dressing Bathing Assistance: Limited assistance Feeding assistance: Independent Dressing Assistance: Limited assistance     Functional Limitations Info  Sight, Hearing, Speech Sight Info: Impaired Hearing Info: Impaired(HOH) Speech Info: Adequate    SPECIAL CARE FACTORS FREQUENCY  PT (By licensed PT), OT (By licensed  OT)     PT Frequency: PT x5 OT Frequency: OT x5            Contractures      Additional Factors Info  Code Status, Allergies Code Status Info: FULL Allergies Info: Celebrex, Celecoxib, Flexeril, Cyclobenzaprine           Current  Medications (01/16/2020):  This is the current hospital active medication list Current Facility-Administered Medications  Medication Dose Route Frequency Provider Last Rate Last Admin  . buPROPion (WELLBUTRIN SR) 12 hr tablet 150 mg  150 mg Oral BID Alma Friendly, MD   150 mg at 01/16/20 1045  . Chlorhexidine Gluconate Cloth 2 % PADS 6 each  6 each Topical Daily Alma Friendly, MD   6 each at 01/16/20 1046  . diclofenac Sodium (VOLTAREN) 1 % topical gel 2 g  2 g Topical QID Pokhrel, Laxman, MD   2 g at 01/16/20 1046  . enoxaparin (LOVENOX) injection 40 mg  40 mg Subcutaneous Q24H Pokhrel, Laxman, MD   40 mg at 01/15/20 1848  . gabapentin (NEURONTIN) capsule 300 mg  300 mg Oral TID Alma Friendly, MD   300 mg at 01/16/20 1043  . HYDROcodone-acetaminophen (NORCO/VICODIN) 5-325 MG per tablet 1 tablet  1 tablet Oral Q6H PRN Alma Friendly, MD   1 tablet at 01/16/20 1115  . methocarbamol (ROBAXIN) tablet 500 mg  500 mg Oral Q6H PRN Alma Friendly, MD   500 mg at 01/15/20 1024  . ondansetron (ZOFRAN) tablet 4 mg  4 mg Oral Q6H PRN Phillips Grout, MD       Or  . ondansetron (ZOFRAN) injection 4 mg  4 mg Intravenous Q6H PRN Phillips Grout, MD      . pantoprazole (PROTONIX) EC tablet 40 mg  40 mg Oral BID Alma Friendly, MD   40 mg at 01/16/20 1044  . polyvinyl alcohol (LIQUIFILM TEARS) 1.4 % ophthalmic solution 1 drop  1 drop Both Eyes TID Minda Ditto, RPH   1 drop at 01/16/20 1046  . propranolol (INDERAL) tablet 10 mg  10 mg Oral Daily Alma Friendly, MD   10 mg at 01/16/20 1043  . rOPINIRole (REQUIP) tablet 2 mg  2 mg Oral QHS Alma Friendly, MD   2 mg at 01/15/20 2246  . sodium chloride flush (NS) 0.9 % injection 3 mL  3 mL Intravenous Once Long, Wonda Olds, MD      . vancomycin (VANCOCIN) 50 mg/mL oral solution 125 mg  125 mg Oral QID Pokhrel, Laxman, MD   125 mg at 01/16/20 1044     Discharge Medications: Please see discharge summary for a list  of discharge medications.  Relevant Imaging Results:  Relevant Lab Results:   Additional Information [phone]  Purcell Mouton, RN

## 2020-01-17 DIAGNOSIS — K219 Gastro-esophageal reflux disease without esophagitis: Secondary | ICD-10-CM | POA: Diagnosis not present

## 2020-01-17 DIAGNOSIS — M62838 Other muscle spasm: Secondary | ICD-10-CM | POA: Diagnosis not present

## 2020-01-17 DIAGNOSIS — N3000 Acute cystitis without hematuria: Secondary | ICD-10-CM | POA: Diagnosis not present

## 2020-01-17 DIAGNOSIS — G4733 Obstructive sleep apnea (adult) (pediatric): Secondary | ICD-10-CM | POA: Diagnosis not present

## 2020-01-17 DIAGNOSIS — R197 Diarrhea, unspecified: Secondary | ICD-10-CM | POA: Diagnosis not present

## 2020-01-17 DIAGNOSIS — Z978 Presence of other specified devices: Secondary | ICD-10-CM | POA: Diagnosis not present

## 2020-01-17 DIAGNOSIS — R52 Pain, unspecified: Secondary | ICD-10-CM | POA: Diagnosis not present

## 2020-01-17 DIAGNOSIS — M2550 Pain in unspecified joint: Secondary | ICD-10-CM | POA: Diagnosis not present

## 2020-01-17 DIAGNOSIS — D638 Anemia in other chronic diseases classified elsewhere: Secondary | ICD-10-CM | POA: Diagnosis not present

## 2020-01-17 DIAGNOSIS — M545 Low back pain: Secondary | ICD-10-CM | POA: Diagnosis not present

## 2020-01-17 DIAGNOSIS — G629 Polyneuropathy, unspecified: Secondary | ICD-10-CM | POA: Diagnosis not present

## 2020-01-17 DIAGNOSIS — I2510 Atherosclerotic heart disease of native coronary artery without angina pectoris: Secondary | ICD-10-CM | POA: Diagnosis not present

## 2020-01-17 DIAGNOSIS — R2689 Other abnormalities of gait and mobility: Secondary | ICD-10-CM | POA: Diagnosis not present

## 2020-01-17 DIAGNOSIS — F339 Major depressive disorder, recurrent, unspecified: Secondary | ICD-10-CM | POA: Diagnosis not present

## 2020-01-17 DIAGNOSIS — Z7401 Bed confinement status: Secondary | ICD-10-CM | POA: Diagnosis not present

## 2020-01-17 DIAGNOSIS — N390 Urinary tract infection, site not specified: Secondary | ICD-10-CM | POA: Diagnosis not present

## 2020-01-17 DIAGNOSIS — G4752 REM sleep behavior disorder: Secondary | ICD-10-CM | POA: Diagnosis not present

## 2020-01-17 DIAGNOSIS — N138 Other obstructive and reflux uropathy: Secondary | ICD-10-CM | POA: Diagnosis not present

## 2020-01-17 DIAGNOSIS — G250 Essential tremor: Secondary | ICD-10-CM | POA: Diagnosis not present

## 2020-01-17 DIAGNOSIS — I10 Essential (primary) hypertension: Secondary | ICD-10-CM | POA: Diagnosis not present

## 2020-01-17 DIAGNOSIS — A0472 Enterocolitis due to Clostridium difficile, not specified as recurrent: Secondary | ICD-10-CM | POA: Diagnosis present

## 2020-01-17 DIAGNOSIS — M6281 Muscle weakness (generalized): Secondary | ICD-10-CM | POA: Diagnosis not present

## 2020-01-17 DIAGNOSIS — R402411 Glasgow coma scale score 13-15, in the field [EMT or ambulance]: Secondary | ICD-10-CM | POA: Diagnosis not present

## 2020-01-17 DIAGNOSIS — Z9989 Dependence on other enabling machines and devices: Secondary | ICD-10-CM | POA: Diagnosis not present

## 2020-01-17 DIAGNOSIS — R41841 Cognitive communication deficit: Secondary | ICD-10-CM | POA: Diagnosis not present

## 2020-01-17 DIAGNOSIS — N401 Enlarged prostate with lower urinary tract symptoms: Secondary | ICD-10-CM | POA: Diagnosis not present

## 2020-01-17 DIAGNOSIS — R531 Weakness: Secondary | ICD-10-CM | POA: Diagnosis not present

## 2020-01-17 DIAGNOSIS — G4710 Hypersomnia, unspecified: Secondary | ICD-10-CM | POA: Diagnosis not present

## 2020-01-17 DIAGNOSIS — Z20828 Contact with and (suspected) exposure to other viral communicable diseases: Secondary | ICD-10-CM | POA: Diagnosis not present

## 2020-01-17 DIAGNOSIS — G894 Chronic pain syndrome: Secondary | ICD-10-CM | POA: Diagnosis not present

## 2020-01-17 LAB — CBC WITH DIFFERENTIAL/PLATELET
Abs Immature Granulocytes: 0.11 10*3/uL — ABNORMAL HIGH (ref 0.00–0.07)
Basophils Absolute: 0 10*3/uL (ref 0.0–0.1)
Basophils Relative: 0 %
Eosinophils Absolute: 0.5 10*3/uL (ref 0.0–0.5)
Eosinophils Relative: 4 %
HCT: 34 % — ABNORMAL LOW (ref 39.0–52.0)
Hemoglobin: 10.9 g/dL — ABNORMAL LOW (ref 13.0–17.0)
Immature Granulocytes: 1 %
Lymphocytes Relative: 6 %
Lymphs Abs: 0.7 10*3/uL (ref 0.7–4.0)
MCH: 29.4 pg (ref 26.0–34.0)
MCHC: 32.1 g/dL (ref 30.0–36.0)
MCV: 91.6 fL (ref 80.0–100.0)
Monocytes Absolute: 0.8 10*3/uL (ref 0.1–1.0)
Monocytes Relative: 7 %
Neutro Abs: 9.4 10*3/uL — ABNORMAL HIGH (ref 1.7–7.7)
Neutrophils Relative %: 82 %
Platelets: 256 10*3/uL (ref 150–400)
RBC: 3.71 MIL/uL — ABNORMAL LOW (ref 4.22–5.81)
RDW: 14.7 % (ref 11.5–15.5)
WBC: 11.6 10*3/uL — ABNORMAL HIGH (ref 4.0–10.5)
nRBC: 0 % (ref 0.0–0.2)

## 2020-01-17 LAB — GI PATHOGEN PANEL BY PCR, STOOL

## 2020-01-17 LAB — BASIC METABOLIC PANEL
Anion gap: 7 (ref 5–15)
BUN: 12 mg/dL (ref 8–23)
CO2: 20 mmol/L — ABNORMAL LOW (ref 22–32)
Calcium: 7.7 mg/dL — ABNORMAL LOW (ref 8.9–10.3)
Chloride: 108 mmol/L (ref 98–111)
Creatinine, Ser: 0.61 mg/dL (ref 0.61–1.24)
GFR calc Af Amer: >60 mL/min (ref >60)
GFR calc non Af Amer: >60 mL/min (ref >60)
Glucose, Bld: 100 mg/dL — ABNORMAL HIGH (ref 70–99)
Potassium: 3.8 mmol/L (ref 3.5–5.1)
Sodium: 135 mmol/L (ref 135–145)

## 2020-01-17 LAB — MAGNESIUM: Magnesium: 1.7 mg/dL (ref 1.7–2.4)

## 2020-01-17 MED ORDER — HYDROCODONE-ACETAMINOPHEN 5-325 MG PO TABS: 1.0000 | ORAL_TABLET | Freq: Four times a day (QID) | ORAL | 0 refills | Status: DC | PRN

## 2020-01-17 MED ORDER — VANCOMYCIN 50 MG/ML ORAL SOLUTION: 125.0000 mg | Freq: Four times a day (QID) | ORAL | 0 refills | Status: AC

## 2020-01-17 MED ORDER — TRAMADOL HCL 50 MG PO TABS: 100.0000 mg | ORAL_TABLET | Freq: Two times a day (BID) | ORAL | 0 refills | Status: DC | PRN

## 2020-01-17 MED ORDER — DICLOFENAC SODIUM 1 % EX GEL: 2.0000 g | Freq: Four times a day (QID) | CUTANEOUS | Status: AC

## 2020-01-17 NOTE — TOC Transition Note (Addendum)
Transition of Care [facility]) - CM/SW Discharge Note   Patient Details  Name: KAIHAN DIMARCO MRN: [id number] Date of Birth: 08/30/1931  [facility] CM/SW Contact:  Servando Snare, LCSW Phone Number: 01/17/2020, 11:00 AM   Clinical Narrative:   Patient ready to transfer to [facility]. LCSW faxed dc docs and received confirmation of docs. RN report  Number [phone]. LCSW has called EMS multiple times with no success. Weather conditions may impede transport.   12:36pm- Patient put on EMS list. ETA unknown due to weather conditions. LCSW notified floor RN.    Final next level of care: Skilled Nursing Facility Barriers to Discharge: No Barriers Identified   Patient Goals and CMS Choice Patient states their goals for this hospitalization and ongoing recovery are:: return home   Choice offered to / list presented to : Patient  Discharge Placement              Patient chooses bed at: [facility] Patient to be transferred to facility by: EMS      Discharge Plan and Services                                     Social Determinants of Health (SDOH) Interventions     Readmission Risk Interventions No flowsheet data found.

## 2020-01-17 NOTE — Discharge Summary (Signed)
Physician Discharge Summary  Charles [name] [id number] DOB: [birth year] DOA: 01/12/2020  PCP: Leanna Battles, MD  Admit date: 01/12/2020 Discharge date: 01/17/2020  Admitted From: Independent living facility  Discharge disposition: SNF   Recommendations for Outpatient Follow-Up:   . Follow up with your primary care provider at the skilled nursing facility in 3 to 5 days  . Check CBC, BMP in 3 to 5 days.  . Continue cervical collar for next 3 to 5 days, continue warm compress and diclofenac gel as well.  . Patient will have to continue oral vancomycin course of total 10 days.  He has received 3 days of oral vancomycin in the hospital.  Try to avoid antibiotic if possible.  Encourage oral hydration and nutrition  . Patient does have chronic indwelling Foley catheter.  Due for change on 01/31/2020.  Patient changes Foley catheter every 30 days.   Discharge Diagnosis:   Principal Problem:   C. difficile diarrhea Active Problems:   Essential hypertension   Chronic pain syndrome   CAD (coronary artery disease)   Mild cognitive impairment   Weakness generalized   Neck muscle spasm   Discharge Condition: Improved.  Diet recommendation: Low sodium, heart healthy.    Wound care: None.  Code status: Full.   History of Present Illness:   Charles Norena Cunninghamis a 86 y.o.malewith medical history significant ofAVM, hypertension, obstructive sleep apnea presented to the hospital with 4 to 5 days of profound weakness and diarrhea. Patient does have a history of chronic diarrhea but over the last 4 days it was much worse than normal. He denied any fevers. He had not been eating and drinking very well and had been feeling awful. Denied any nausea or vomiting/ abdominal pain. Patient found to have a UTI and referred for admission for such. Covid test was negative.  During hospitalization ,patient did have episodes of diarrhea.  C. difficile was positive.   Hospital  Course:   Following conditions were addressed during hospitalization as listed below,  C. difficile diarrhea.   Improved with oral vancomycin.  Patient will need to continue vancomycin orally on discharge for the next 7 days to complete total 10-day course.  Patient has mild leukocytosis but no fever or abdominal pain.   Neck pain likely secondary to neck spasm.   Continue warm compress, diclofenac gel, muscle relaxant  and soft cervical collar for the next 3 to 5 days. X-ray of the C-spine showed a severe degenerative joint disease.   Patient is already on narcotics prior.  Patient was will be continued on discharge.  Chronic diarrhea.  Per the patient's son-Ongoing for almost 18 months.  Patient's family stated that he was supposed to follow-up with GI as outpatient for GI related issues.  I have encouraged the family to keep that appointment.  Diarrhea at this time has improved after initiation of C. difficile treatment.  Gram positive cocci in clusters in 2/4.  Likely to be a contaminant .  Presumed lower UTI on admission received IV Rocephin.   Looks like  colonization.  Urine culture showed multiple species and suggest recollection.  Urine culture was sent again on 01/14/2020 with no growth so far.  Off Rocephin.  Please antibiotics unless patient has clinical signs and symptoms of UTI especially in the context of C. difficile diarrhea..  Chronic indwelling catheter. Next due for replacement on 01/31/20.  Patient changes catheter every 30 days.  Hypokalemia.    Replenished and improved  Essential hypertension-continue on propranolol,  blood pressure remained stable during hospitalization.  Chronic pain syndrome-continue narcotics Robaxin, gabapentin.  Denies overt pain at this time.  CAD stable.  No chest pain.  No other issues during hospitalization  Mild cognitive impairment-patient is oriented and communicative.  He does have mild memory impairment at  baseline..  Generalized weakness.  COVID-19 was negative.  Likely secondary to diarrhea and volume depletion.  Seen by physical therapy who recommended skilled nursing facility placement.  Transition of care on board.    History prostate cancer. Was treated with radiation in the past (as per son). The son states that after this treatment, he had  bladder and with GI issues.    Disposition.  At this time, patient is stable for disposition to skilled nursing facility.  Spoken with the patient's son Cecilie Lowers on the phone yesterday about disposition plan.  Medical Consultants:    None.  Procedures:    None Subjective:   Today, patient feels okay.  His neck pain has improved with cervical collar.  Denies nausea vomiting or abdominal pain.  Had 2 bowel movements yesterday.  Discharge Exam:   Vitals:   01/16/20 2129 01/17/20 0723  BP: (!) 110/49 (!) 124/55  Pulse: (!) 58 (!) 58  Resp: 18 18  Temp: 98.5 F (36.9 C)   SpO2: 96% 96%   Vitals:   01/16/20 0627 01/16/20 1315 01/16/20 2129 01/17/20 0723  BP: (!) 124/50 126/64 (!) 110/49 (!) 124/55  Pulse: 100 97 (!) 58 (!) 58  Resp: 18 17 18 18   Temp: 98.9 F (37.2 C) 98.7 F (37.1 C) 98.5 F (36.9 C)   TempSrc: Oral Oral Oral   SpO2: 96% 98% 96% 96%  Weight: 77.6 kg   75.7 kg  Height:        General: Alert awake, not in obvious distress, on cervical soft collar HENT: pupils equally reacting to light,  No scleral pallor or icterus noted. Oral mucosa is moist.  Chest:  Clear breath sounds.  Diminished breath sounds bilaterally. No crackles or wheezes.  CVS: S1 &S2 heard. No murmur.  Regular rate and rhythm. Abdomen: Soft, nontender, nondistended.  Bowel sounds are heard.  Chronic Foley catheter in place Extremities: No cyanosis, clubbing or edema.  Peripheral pulses are palpable. Psych: Alert, awake and and communicative. CNS:  No cranial nerve deficits.  Power equal in all extremities.   Skin: Warm and dry.  No rashes  noted.  The results of significant diagnostics from this hospitalization (including imaging, microbiology, ancillary and laboratory) are listed below for reference.     Diagnostic Studies:   No results found.   Labs:   Basic Metabolic Panel: Recent Labs  Lab 01/13/20 0855 01/13/20 0855 01/14/20 0305 01/14/20 0305 01/15/20 0240 01/15/20 0240 01/16/20 0243 01/17/20 0241  NA 136  --  135  --  135  --  135 135  K 2.9*   < > 3.0*   < > 3.9   < > 4.2 3.8  CL 107  --  109  --  111  --  112* 108  CO2 17*  --  19*  --  16*  --  18* 20*  GLUCOSE 103*  --  93  --  98  --  126* 100*  BUN 15  --  11  --  11  --  11 12  CREATININE 0.90  --  0.82  --  0.74  --  0.70 0.61  CALCIUM 7.9*  --  7.4*  --  7.6*  --  7.6* 7.7*  MG  --   --   --   --  1.8  --  1.8 1.7   < > = values in this interval not displayed.   GFR Estimated Creatinine Clearance: 62 mL/min (by C-G formula based on SCr of 0.61 mg/dL). Liver Function Tests: Recent Labs  Lab 01/12/20 2110  AST 38  ALT 18  ALKPHOS 76  BILITOT 0.6  PROT 6.5  ALBUMIN 3.0*   Recent Labs  Lab 01/12/20 2110  LIPASE 15   No results for input(s): AMMONIA in the last 168 hours. Coagulation profile No results for input(s): INR, PROTIME in the last 168 hours.  CBC: Recent Labs  Lab 01/13/20 0855 01/14/20 0305 01/15/20 0240 01/16/20 0243 01/17/20 0241  WBC 12.6* 13.1* 18.0* 16.8* 11.6*  NEUTROABS  --  10.6* 15.7* 14.4* 9.4*  HGB 13.1 10.7* 11.2* 10.7* 10.9*  HCT 39.5 33.0* 33.9* 33.2* 34.0*  MCV 91.2 91.7 91.1 93.0 91.6  PLT 247 239 256 252 256   Cardiac Enzymes: No results for input(s): CKTOTAL, CKMB, CKMBINDEX, TROPONINI in the last 168 hours. BNP: Invalid input(s): POCBNP CBG: No results for input(s): GLUCAP in the last 168 hours. D-Dimer No results for input(s): DDIMER in the last 72 hours. Hgb A1c No results for input(s): HGBA1C in the last 72 hours. Lipid Profile No results for input(s): CHOL, HDL, LDLCALC, TRIG,  CHOLHDL, LDLDIRECT in the last 72 hours. Thyroid function studies No results for input(s): TSH, T4TOTAL, T3FREE, THYROIDAB in the last 72 hours.  Invalid input(s): FREET3 Anemia work up No results for input(s): VITAMINB12, FOLATE, FERRITIN, TIBC, IRON, RETICCTPCT in the last 72 hours. Microbiology Recent Results (from the past 240 hour(s))  Urine culture     Status: Abnormal   Collection Time: 01/12/20 11:31 PM   Specimen: Urine, Clean Catch  Result Value Ref Range Status   Specimen Description   Final    URINE, CLEAN CATCH Performed at [facility], [place] [address] [place], [place] [address]    Special Requests   Final    NONE Performed at [facility], [place] [address], [place], [place] [address]    Culture MULTIPLE SPECIES PRESENT, SUGGEST RECOLLECTION (A)  Final   Report Status 01/14/2020 FINAL  Final  SARS CORONAVIRUS 2 (TAT 6-24 HRS) Nasopharyngeal Nasopharyngeal Swab     Status: None   Collection Time: 01/13/20  2:11 AM   Specimen: Nasopharyngeal Swab  Result Value Ref Range Status   SARS Coronavirus 2 NEGATIVE NEGATIVE Final    Comment: (NOTE) SARS-CoV-2 target nucleic acids are NOT DETECTED. The SARS-CoV-2 RNA is generally detectable in upper and lower respiratory specimens during the acute phase of infection. Negative results do not preclude SARS-CoV-2 infection, do not rule out co-infections with other pathogens, and should not be used as the sole basis for treatment or other patient management decisions. Negative results must be combined with clinical observations, patient history, and epidemiological information. The expected result is Negative. Fact Sheet for Patients: SugarRoll.be Fact Sheet for Healthcare Providers: https://www.woods-mathews.com/ This test is not yet approved or cleared by the [place] FDA and  has been authorized for detection and/or diagnosis of SARS-CoV-2  by FDA under an Emergency Use Authorization (EUA). This EUA will remain  in effect (meaning this test can be used) for the duration of the COVID-19 declaration under Section 56 4(b)(1) of the Act, 21 U.S.C. section 360bbb-3(b)(1), unless the authorization is terminated or revoked sooner.  Performed at [facility], [place] [address], [place], [place] [address]   Culture, blood (routine x 2)     Status: None (Preliminary result)   Collection Time: 01/13/20  8:55 AM   Specimen: BLOOD  Result Value Ref Range Status   Specimen Description   Final    BLOOD RIGHT ANTECUBITAL Performed at [place] [address], [name], [place] [address]    Special Requests   Final    BOTTLES DRAWN AEROBIC AND ANAEROBIC Blood Culture results may not be optimal due to an inadequate volume of blood received in culture bottles Performed at [place] [facility] [address] [place], [place] [address]    Culture   Final    NO GROWTH 3 DAYS Performed at [facility], [place] [address] [place], [place] [address]    Report Status PENDING  Incomplete  Culture, blood (routine x 2)     Status: Abnormal   Collection Time: 01/13/20  9:05 AM   Specimen: BLOOD RIGHT FOREARM  Result Value Ref Range Status   Specimen Description   Final    BLOOD RIGHT FOREARM Performed at [place] [address] [place], [place] [address]    Special Requests   Final    BOTTLES DRAWN AEROBIC AND ANAEROBIC Blood Culture adequate volume Performed at [place] [address], [place], [place] [address]    Culture  Setup Time   Final    GRAM POSITIVE COCCI IN CLUSTERS IN BOTH AEROBIC AND ANAEROBIC BOTTLES CRITICAL RESULT CALLED TO, READ BACK BY AND VERIFIED WITH: E. JACKSON PHARMD, AT 1003 01/14/20 BY D. VANHOOK    Culture (A)  Final    STAPHYLOCOCCUS SPECIES (COAGULASE NEGATIVE) THE SIGNIFICANCE OF ISOLATING THIS ORGANISM FROM A SINGLE SET  OF BLOOD CULTURES WHEN MULTIPLE SETS ARE DRAWN IS UNCERTAIN. PLEASE NOTIFY THE MICROBIOLOGY DEPARTMENT WITHIN ONE WEEK IF SPECIATION AND SENSITIVITIES ARE REQUIRED. Performed at [facility], [place] [address] [place], [place] [address]    Report Status 01/16/2020 FINAL  Final  GI pathogen panel by PCR, stool     Status: None   Collection Time: 01/13/20  4:13 PM   Specimen: Stool  Result Value Ref Range Status   Plesiomonas shigelloides NOT DETECTED NOT DETECTED Final   Yersinia enterocolitica NOT DETECTED NOT DETECTED Final   Vibrio NOT DETECTED NOT DETECTED Final   Enteropathogenic E coli NOT DETECTED NOT DETECTED Final   E coli (ETEC) LT/ST NOT DETECTED NOT DETECTED Final   E coli [id number] by PCR Not applicable NOT DETECTED Final   Cryptosporidium by PCR NOT DETECTED NOT DETECTED Final   Entamoeba histolytica NOT DETECTED NOT DETECTED Final   Adenovirus F 40/41 NOT DETECTED NOT DETECTED Final   Norovirus GI/GII NOT DETECTED NOT DETECTED Final   Sapovirus NOT DETECTED NOT DETECTED Final    Comment: (NOTE) Performed At: [facility] [place], [place] [id number] Rush Farmer MD [id number]    Vibrio cholerae NOT DETECTED NOT DETECTED Final   Campylobacter by PCR NOT DETECTED NOT DETECTED Final   Salmonella by PCR NOT DETECTED NOT DETECTED Final   E coli (STEC) NOT DETECTED NOT DETECTED Final   Enteroaggregative E coli NOT DETECTED NOT DETECTED Final   Shigella by PCR NOT DETECTED NOT DETECTED Final   Cyclospora cayetanensis NOT DETECTED NOT DETECTED Final   Astrovirus NOT DETECTED NOT DETECTED Final   G lamblia by PCR NOT DETECTED  NOT DETECTED Final   Rotavirus A by PCR NOT DETECTED NOT DETECTED Final  C difficile quick scan w PCR reflex     Status: Abnormal   Collection Time: 01/14/20  8:23 AM   Specimen: Urine, Clean Catch; Stool  Result Value Ref Range Status   C Diff antigen POSITIVE (A) NEGATIVE Final   C Diff toxin POSITIVE (A) NEGATIVE Final    C Diff interpretation Toxin producing C. difficile detected.  Final    Comment: CRITICAL RESULT CALLED TO, READ BACK BY AND VERIFIED WITH: GUNDLACH,D RN @1024  01/14/20 BILLINGSLEY,L Performed at [facility], [place] [address], [place], [place] [address]   Culture, Urine     Status: None   Collection Time: 01/14/20  8:26 AM   Specimen: Urine, Clean Catch  Result Value Ref Range Status   Specimen Description   Final    URINE, CLEAN CATCH Performed at [facility], [place] [address], [place], [place] [address]    Special Requests   Final    NONE Performed at [facility], [place] [address], [place], [place] [address]    Culture   Final    NO GROWTH Performed at [facility], [place] [address], [place], [place] [address]    Report Status 01/15/2020 FINAL  Final  SARS CORONAVIRUS 2 (TAT 6-24 HRS) Nasopharyngeal Nasopharyngeal Swab     Status: None   Collection Time: 01/16/20  3:58 PM   Specimen: Nasopharyngeal Swab  Result Value Ref Range Status   SARS Coronavirus 2 NEGATIVE NEGATIVE Final    Comment: (NOTE) SARS-CoV-2 target nucleic acids are NOT DETECTED. The SARS-CoV-2 RNA is generally detectable in upper and lower respiratory specimens during the acute phase of infection. Negative results do not preclude SARS-CoV-2 infection, do not rule out co-infections with other pathogens, and should not be used as the sole basis for treatment or other patient management decisions. Negative results must be combined with clinical observations, patient history, and epidemiological information. The expected result is Negative. Fact Sheet for Patients: SugarRoll.be Fact Sheet for Healthcare Providers: https://www.woods-mathews.com/ This test is not yet approved or cleared by the [place] FDA and  has been authorized for detection and/or diagnosis of SARS-CoV-2 by FDA under an Emergency Use  Authorization (EUA). This EUA will remain  in effect (meaning this test can be used) for the duration of the COVID-19 declaration under Section 56 4(b)(1) of the Act, 21 U.S.C. section 360bbb-3(b)(1), unless the authorization is terminated or revoked sooner. Performed at [facility], [place] [address] [place], [place] [address]      Discharge Instructions:   Discharge Instructions    Diet - low sodium heart healthy   Complete by: As directed    Discharge instructions   Complete by: As directed    Complete the course of oral vancomycin.  Try to avoid antibiotics if possible.  Use soft neck collar for next 3 to 5 days.  Warm compress on the neck 2-3 times a day.  Increase oral hydration.  Follow-up with primary care provider at the skilled nursing facility in 3 to 5 days.   Increase activity slowly   Complete by: As directed      Allergies as of 01/17/2020      Reactions   Celebrex [celecoxib] Other (See Comments)   Created stomach ulcers.   Flexeril [cyclobenzaprine] Other (See Comments)   Caught in hiatal hernia and caused extreme pain      Medication  List    TAKE these medications   acetaminophen 500 MG tablet Commonly known as: TYLENOL Take 500 mg by mouth every 8 (eight) hours.   buPROPion 150 MG 12 hr tablet Commonly known as: WELLBUTRIN SR Take 150 mg by mouth 2 (two) times daily.   CENTRUM ADULTS PO Take 1 tablet by mouth daily.   PRESERVISION AREDS 2 PO Take 1 capsule by mouth 2 (two) times daily.   cholecalciferol 1000 units tablet Commonly known as: VITAMIN D Take 1,000 Units by mouth daily.   diclofenac Sodium 1 % Gel Commonly known as: VOLTAREN Apply 2 g topically 4 (four) times daily.   gabapentin 300 MG capsule Commonly known as: NEURONTIN Take 1 tablet PO at 4 PM  and 1 tablet at 10 PM What changed:   how much to take  how to take this  when to take this  additional instructions   HYDROcodone-acetaminophen 5-325 MG  tablet Commonly known as: NORCO/VICODIN Take 1 tablet by mouth every 6 (six) hours as needed for moderate pain.   lovastatin 20 MG tablet Commonly known as: MEVACOR Take 20 mg by mouth at bedtime.   methocarbamol 500 MG tablet Commonly known as: ROBAXIN Take 500 mg by mouth every 6 (six) hours as needed for muscle spasms.   pantoprazole 40 MG tablet Commonly known as: PROTONIX Take 40 mg by mouth 2 (two) times daily.   propranolol 10 MG tablet Commonly known as: INDERAL Take 10 mg by mouth daily.   Refresh Repair 0.5-0.9 % ophthalmic solution Generic drug: carboxymethylcellul-glycerin Place 1 drop into both eyes 3 (three) times daily.   rOPINIRole 1 MG tablet Commonly known as: REQUIP TAKE 2 TABLETS(2 MG) BY MOUTH AT BEDTIME What changed: See the new instructions.   traMADol 50 MG tablet Commonly known as: ULTRAM Take 2 tablets (100 mg total) by mouth every 12 (twelve) hours as needed (For pain.). Hold for sedation   vancomycin 50 mg/mL  oral solution Commonly known as: VANCOCIN Take 2.5 mLs (125 mg total) by mouth 4 (four) times daily for 7 days.         Time coordinating discharge: 39 minutes  Signed:  Nnaemeka Samson  [facility] 01/17/2020, 10:20 AM

## 2020-01-17 NOTE — Progress Notes (Signed)
Report called to [facility] for transport.

## 2020-01-18 LAB — CULTURE, BLOOD (ROUTINE X 2): Culture: NO GROWTH

## 2020-01-19 ENCOUNTER — Non-Acute Institutional Stay (SKILLED_NURSING_FACILITY): Payer: Medicare Other | Admitting: Nurse Practitioner

## 2020-01-19 DIAGNOSIS — G4710 Hypersomnia, unspecified: Secondary | ICD-10-CM

## 2020-01-19 DIAGNOSIS — G894 Chronic pain syndrome: Secondary | ICD-10-CM | POA: Diagnosis not present

## 2020-01-19 DIAGNOSIS — I10 Essential (primary) hypertension: Secondary | ICD-10-CM | POA: Diagnosis not present

## 2020-01-19 DIAGNOSIS — K219 Gastro-esophageal reflux disease without esophagitis: Secondary | ICD-10-CM

## 2020-01-19 DIAGNOSIS — A0472 Enterocolitis due to Clostridium difficile, not specified as recurrent: Secondary | ICD-10-CM | POA: Diagnosis not present

## 2020-01-19 DIAGNOSIS — G250 Essential tremor: Secondary | ICD-10-CM

## 2020-01-19 DIAGNOSIS — G4752 REM sleep behavior disorder: Secondary | ICD-10-CM

## 2020-01-19 DIAGNOSIS — M62838 Other muscle spasm: Secondary | ICD-10-CM

## 2020-01-19 DIAGNOSIS — N138 Other obstructive and reflux uropathy: Secondary | ICD-10-CM | POA: Diagnosis not present

## 2020-01-19 DIAGNOSIS — N401 Enlarged prostate with lower urinary tract symptoms: Secondary | ICD-10-CM

## 2020-01-19 DIAGNOSIS — G629 Polyneuropathy, unspecified: Secondary | ICD-10-CM

## 2020-01-19 DIAGNOSIS — F339 Major depressive disorder, recurrent, unspecified: Secondary | ICD-10-CM | POA: Diagnosis not present

## 2020-01-19 DIAGNOSIS — G4730 Sleep apnea, unspecified: Secondary | ICD-10-CM

## 2020-01-20 ENCOUNTER — Encounter: Payer: Self-pay | Admitting: Nurse Practitioner

## 2020-01-20 NOTE — Assessment & Plan Note (Signed)
Stable, continue prn Norco, Robaxin, Tramadol.

## 2020-01-20 NOTE — Assessment & Plan Note (Signed)
Stable, continue Requip

## 2020-01-20 NOTE — Assessment & Plan Note (Signed)
Cervical DDD, continue Voltaren gel.

## 2020-01-20 NOTE — Assessment & Plan Note (Signed)
Stable, continue Pantoprazole.  

## 2020-01-20 NOTE — Assessment & Plan Note (Signed)
Improving, complete total 10 day course of Vanco po.

## 2020-01-20 NOTE — Progress Notes (Signed)
Location:   [facility] Room Number: 26 Place of Service:  SNF (31) Provider:  Yaretzy Olazabal NP  Leanna Battles, MD  Patient Care Team: Leanna Battles, MD as PCP - General (Internal Medicine) [name], MD (Hematology and Oncology) [name], MD as Attending Physician (Urology) [name], MD (Gastroenterology)  Extended Emergency Contact Information Primary Emergency Contact: Khodi, Benyo Mobile Phone: [phone] Relation: Son Preferred language: English Interpreter needed? No Secondary Emergency Contact: Hirschmann,Janet H Address: [address]          [address]          [place], [place] [address] [name] of [place] Phone: [phone] Mobile Phone: [phone] Relation: Spouse  Code Status:  DNR Goals of care: Advanced Directive information Advanced Directives 01/12/2020  Does Patient Have a Medical Advance Directive? No  Type of Advance Directive -  Does patient want to make changes to medical advance directive? -  Copy of [place] in Chart? -  Would patient like information on creating a medical advance directive? No - Patient declined     Chief Complaint  Patient presents with  . Acute Visit    Medication review    HPI:  Pt is a 86 y.o. male seen today for an acute visit for medication reviewed from hospital discharge.   The patient was hospitalized from 01/12/20-01/17/20 for C-diff colitis, on 10 day course of po Vanco  Hx of DDD in neck, Voltaren gel helps. Chronic Foley ise for urinary retention. Chronic peripheral neuropathy, worsened tingling, numbness in BLE/feet, on Gabapentin 300mg  bid. Depression, mood is stable, on Wellbutrin 150mg  bid. Chronic pain syndrome, on prn Norco, Robaxin, prn Tramadol. GERD, stable, on Pantoprazole. REM,  stable on Requip 2mg  qhs. Essential tremor, stable, on Propranolol 10mg  qd.    Past Medical History:  Diagnosis Date  . Anemia   . Arthritis   .  AVM (arteriovenous malformation)   . Barrett's esophagus   . BPH (benign prostatic hyperplasia)   . Cataract   . Constipation   . CPAP (continuous positive airway pressure) dependence   . Depression   . Diverticulosis    diverticular bleeding  . Diverticulosis   . GERD (gastroesophageal reflux disease)   . History of radiation therapy   . HOH (hard of hearing)    wears hearing aids  . Hyperlipidemia   . Hypoglycemia   . Lymphocytosis 06/2012  . Mastodynia   . Murmur    slight  . Nasal congestion   . Osteoporosis   . Osteoporosis   . Prostate cancer ([place])   . Sleep apnea    wears CPAP  . Urinary retention with incomplete bladder emptying    pt uses bathroom and within 30 mins needs to go again  . Vitamin D deficiency    Past Surgical History:  Procedure Laterality Date  . AMPUTATION TOE Right 09/20/2017   Procedure: Right 4th toe amputation;  Surgeon: Wylene Simmer, MD;  Location: [facility];  Service: Orthopedics;  Laterality: Right;  foot block  . AMPUTATION TOE Right 02/05/2019   Procedure: RIGHT GREAT TOE AMPUTATION;  Surgeon: Leandrew Koyanagi, MD;  Location: [facility];  Service: Orthopedics;  Laterality: Right;  . BACK SURGERY  04/2013  . back surgury     f  . BLEPHAROPLASTY Bilateral 02/23/15  . CATARACT EXTRACTION    . COLONOSCOPY    . COLONOSCOPY N/A 10/02/2017   Procedure: COLONOSCOPY;  Surgeon: Doran Stabler, MD;  Location: [facility];  Service: Gastroenterology;  Laterality: N/A;  . COLONOSCOPY WITH PROPOFOL N/A 10/01/2017   Procedure: COLONOSCOPY WITH PROPOFOL;  Surgeon: Doran Stabler, MD;  Location: [facility];  Service: Gastroenterology;  Laterality: N/A;  . COLONOSCOPY WITH PROPOFOL N/A 03/07/2018   Procedure: COLONOSCOPY WITH PROPOFOL;  Surgeon: [name], MD;  Location: [facility];  Service: Endoscopy;  Laterality: N/A;  . ESOPHAGOGASTRODUODENOSCOPY (EGD) WITH PROPOFOL N/A 10/01/2017   Procedure:  ESOPHAGOGASTRODUODENOSCOPY (EGD) WITH PROPOFOL;  Surgeon: Doran Stabler, MD;  Location: [facility];  Service: Gastroenterology;  Laterality: N/A;  . EYE SURGERY Bilateral   . FINGER SURGERY Right    right thumb  . HARDWARE REMOVAL Right 05/15/2016   Procedure: Right Lumbar Two-Lumbar Five Removal of Hardware;  Surgeon: Kristeen Miss, MD;  Location: [facility];  Service: Neurosurgery;  Laterality: Right;  Right L2-5 removal of hardware  . INGUINAL HERNIA REPAIR     x4  . IR RADIOLOGIST EVAL & MGMT  07/10/2018  . IR RADIOLOGIST EVAL & MGMT  09/05/2018  . IR SACROPLASTY BILATERAL  07/15/2018  . REPAIR SPIGELIAN HERNIA  2011  . [place] ,2014  . TONSILLECTOMY    . UPPER GASTROINTESTINAL ENDOSCOPY    . VASECTOMY      Allergies  Allergen Reactions  . Celebrex [Celecoxib] Other (See Comments)    Created stomach ulcers.  Yvette Rack [Cyclobenzaprine] Other (See Comments)    Caught in hiatal hernia and caused extreme pain    Allergies as of 01/19/2020      Reactions   Celebrex [celecoxib] Other (See Comments)   Created stomach ulcers.   Flexeril [cyclobenzaprine] Other (See Comments)   Caught in hiatal hernia and caused extreme pain      Medication List       Accurate as of January 19, 2020 11:59 PM. If you have any questions, ask your nurse or doctor.        acetaminophen 500 MG tablet Commonly known as: TYLENOL Take 500 mg by mouth every 8 (eight) hours.   buPROPion 150 MG 12 hr tablet Commonly known as: WELLBUTRIN SR Take 150 mg by mouth 2 (two) times daily.   CENTRUM ADULTS PO Take 1 tablet by mouth daily.   PRESERVISION AREDS 2 PO Take 1 capsule by mouth 2 (two) times daily.   cholecalciferol 1000 units tablet Commonly known as: VITAMIN D Take 1,000 Units by mouth daily.   diclofenac Sodium 1 % Gel Commonly known as: VOLTAREN Apply 2 g topically 4 (four) times daily.   gabapentin 300 MG capsule Commonly known as: NEURONTIN Take 300 mg by mouth  3 (three) times daily. What changed: Another medication with the same name was removed. Continue taking this medication, and follow the directions you see here.   HYDROcodone-acetaminophen 5-325 MG tablet Commonly known as: NORCO/VICODIN Take 1 tablet by mouth every 6 (six) hours as needed for moderate pain.   lovastatin 20 MG tablet Commonly known as: MEVACOR Take 20 mg by mouth at bedtime.   methocarbamol 500 MG tablet Commonly known as: ROBAXIN Take 500 mg by mouth every 6 (six) hours as needed for muscle spasms.   pantoprazole 40 MG tablet Commonly known as: PROTONIX Take 40 mg by mouth 2 (two) times daily.   propranolol 10 MG tablet Commonly known as: INDERAL Take 10 mg by mouth daily.   Refresh Repair 0.5-0.9 % ophthalmic solution Generic drug: carboxymethylcellul-glycerin Place  1 drop into both eyes 3 (three) times daily.   rOPINIRole 1 MG tablet Commonly known as: REQUIP TAKE 2 TABLETS(2 MG) BY MOUTH AT BEDTIME What changed: See the new instructions.   traMADol 50 MG tablet Commonly known as: ULTRAM Take 2 tablets (100 mg total) by mouth every 12 (twelve) hours as needed (For pain.). Hold for sedation   vancomycin 50 mg/mL  oral solution Commonly known as: VANCOCIN Take 2.5 mLs (125 mg total) by mouth 4 (four) times daily for 7 days.   zinc oxide 20 % ointment Apply 1 application topically as needed for irritation.      ROS was provided with assistance of staff.  Review of Systems  Constitutional: Positive for fatigue. Negative for activity change, appetite change, chills, diaphoresis and fever.  HENT: Positive for hearing loss. Negative for congestion and voice change.   Eyes: Negative for visual disturbance.  Respiratory: Negative for cough, shortness of breath and wheezing.   Cardiovascular: Negative for chest pain, palpitations and leg swelling.  Gastrointestinal: Positive for diarrhea. Negative for abdominal distention, abdominal pain, constipation,  nausea and vomiting.  Genitourinary: Positive for difficulty urinating. Negative for dysuria and urgency.       Foley  Musculoskeletal: Positive for arthralgias, back pain, gait problem and neck pain.  Skin: Negative for color change and pallor.  Neurological: Positive for tremors. Negative for dizziness, speech difficulty and headaches.       Memory lapses.   Psychiatric/Behavioral: Negative for agitation, behavioral problems, hallucinations and sleep disturbance. The patient is not nervous/anxious.     Immunization History  Administered Date(s) Administered  . Influenza, High Dose Seasonal PF 09/17/2019  . Influenza-Unspecified 09/16/2018  . Moderna SARS-COVID-2 Vaccination 12/08/2019, 01/05/2020  . Pneumococcal Conjugate-13 12/29/2013  . Pneumococcal Polysaccharide-23 09/08/2005  . Tdap 05/25/2005, 08/08/2018  . Zoster Recombinat (Shingrix) 10/17/2017, 01/25/2018   Pertinent  Health Maintenance Due  Topic Date Due  . FOOT EXAM  08/13/1941  . OPHTHALMOLOGY EXAM  08/13/1941  . URINE MICROALBUMIN  08/13/1941  . INFLUENZA VACCINE  Completed  . PNA vac Low Risk Adult  Completed  . HEMOGLOBIN A1C  Discontinued   Fall Risk  01/01/2019 09/24/2018 01/04/2017 10/06/2016 09/29/2016  Falls in the past year? 0 No No No No   Functional Status Survey:    Vitals:   01/20/20 1314  BP: (!) 141/79  Pulse: 66  Resp: 16  Temp: (!) 97.3 F (36.3 C)  SpO2: 96%  Weight: 163 lb (73.9 kg)  Height: 5\' 6"  (1.676 m)   Body mass index is 26.31 kg/m. Physical Exam Vitals and nursing note reviewed.  Constitutional:      General: He is not in acute distress.    Appearance: Normal appearance. He is not ill-appearing, toxic-appearing or diaphoretic.  HENT:     Head: Normocephalic and atraumatic.     Nose: Nose normal.     Mouth/Throat:     Mouth: Mucous membranes are moist.  Eyes:     Extraocular Movements: Extraocular movements intact.     Conjunctiva/sclera: Conjunctivae normal.      Pupils: Pupils are equal, round, and reactive to light.  Cardiovascular:     Rate and Rhythm: Normal rate and regular rhythm.     Heart sounds: No murmur.  Pulmonary:     Effort: Pulmonary effort is normal.     Breath sounds: No wheezing, rhonchi or rales.  Abdominal:     General: Bowel sounds are normal. There is no distension.  Palpations: Abdomen is soft.     Tenderness: There is no abdominal tenderness. There is no right CVA tenderness, left CVA tenderness, guarding or rebound.  Musculoskeletal:     Cervical back: Normal range of motion and neck supple.     Right lower leg: No edema.     Left lower leg: No edema.  Skin:    General: Skin is warm and dry.  Neurological:     General: No focal deficit present.     Mental Status: He is alert. Mental status is at baseline.     Cranial Nerves: No cranial nerve deficit.     Motor: No weakness.     Coordination: Coordination normal.     Gait: Gait abnormal.     Comments: Oriented to person, place.   Psychiatric:        Mood and Affect: Mood normal.        Behavior: Behavior normal.        Thought Content: Thought content normal.        Judgment: Judgment normal.     Labs reviewed: Recent Labs    01/15/20 0240 01/16/20 0243 01/17/20 0241  NA 135 135 135  K 3.9 4.2 3.8  CL 111 112* 108  CO2 16* 18* 20*  GLUCOSE 98 126* 100*  BUN 11 11 12   CREATININE 0.74 0.70 0.61  CALCIUM 7.6* 7.6* 7.7*  MG 1.8 1.8 1.7   Recent Labs    01/12/20 2110  AST 38  ALT 18  ALKPHOS 76  BILITOT 0.6  PROT 6.5  ALBUMIN 3.0*   Recent Labs    01/15/20 0240 01/16/20 0243 01/17/20 0241  WBC 18.0* 16.8* 11.6*  NEUTROABS 15.7* 14.4* 9.4*  HGB 11.2* 10.7* 10.9*  HCT 33.9* 33.2* 34.0*  MCV 91.1 93.0 91.6  PLT 256 252 256   Lab Results  Component Value Date   TSH 2.35 07/08/2018   Lab Results  Component Value Date   HGBA1C 5.2 08/29/2018   Lab Results  Component Value Date   CHOL 158 08/29/2018   HDL 43 08/29/2018   LDLCALC  98 08/29/2018   TRIG 76 08/29/2018    Significant Diagnostic Results in last 30 days:  DG Cervical Spine 2 or 3 views  Result Date: 01/15/2020 CLINICAL DATA:  Neck pain. EXAM: CERVICAL SPINE - 2-3 VIEW COMPARISON:  July 03, 2016. FINDINGS: Mild grade 1 anterolisthesis of C4-5 is noted secondary to posterior facet joint hypertrophy. No definite fracture is noted. No prevertebral soft tissue swelling is noted. Severe degenerative disc disease is noted at C5-6 and C6-7 with anterior osteophyte formation. IMPRESSION: Severe multilevel degenerative disc disease. No acute abnormality seen in the cervical spine. Electronically Signed   By: Marijo Conception M.D.   On: 01/15/2020 13:44    Assessment/Plan Neuropathy 01/19/20 the patient stated tingling and numbness are more severe and constantly than prior, will increase Gabapentin 300mg  to tid. Observe.   C. difficile diarrhea Improving, complete total 10 day course of Vanco po.   GERD Stable, continue Pantoprazole.   Tremor, essential Stable, continue Propranolol.   BPH (benign prostatic hyperplasia) Chronic Foley.   Depression, recurrent ([place]) His mood is stable, continue Wellbutrin.   Hypersomnia with sleep apnea Wants to delay CPAP use for now  REM sleep behavior disorder Stable, continue Requip  Essential hypertension Blood pressure is controlled.   Neck muscle spasm Cervical DDD, continue Voltaren gel.   Chronic pain syndrome Stable, continue prn Norco, Robaxin, Tramadol.  Family/ staff Communication: plan of care reviewed with the patient and charge nurse.   Labs/tests ordered:  CBC/diff, BMP  Time spend 35 minutes.

## 2020-01-20 NOTE — Assessment & Plan Note (Signed)
Chronic Foley.  

## 2020-01-20 NOTE — Assessment & Plan Note (Signed)
His mood is stable, continue Wellbutrin.  

## 2020-01-20 NOTE — Assessment & Plan Note (Signed)
Wants to delay CPAP use for now

## 2020-01-20 NOTE — Assessment & Plan Note (Signed)
01/19/20 the patient stated tingling and numbness are more severe and constantly than prior, will increase Gabapentin 300mg  to tid. Observe.

## 2020-01-20 NOTE — Assessment & Plan Note (Signed)
Stable, continue Propranolol.  

## 2020-01-20 NOTE — Assessment & Plan Note (Signed)
Blood pressure is controlled

## 2020-01-21 ENCOUNTER — Non-Acute Institutional Stay (SKILLED_NURSING_FACILITY): Payer: Medicare Other | Admitting: Internal Medicine

## 2020-01-21 ENCOUNTER — Encounter: Payer: Self-pay | Admitting: Internal Medicine

## 2020-01-21 DIAGNOSIS — N138 Other obstructive and reflux uropathy: Secondary | ICD-10-CM

## 2020-01-21 DIAGNOSIS — Z9989 Dependence on other enabling machines and devices: Secondary | ICD-10-CM

## 2020-01-21 DIAGNOSIS — G894 Chronic pain syndrome: Secondary | ICD-10-CM

## 2020-01-21 DIAGNOSIS — G629 Polyneuropathy, unspecified: Secondary | ICD-10-CM | POA: Diagnosis not present

## 2020-01-21 DIAGNOSIS — D638 Anemia in other chronic diseases classified elsewhere: Secondary | ICD-10-CM | POA: Diagnosis not present

## 2020-01-21 DIAGNOSIS — F339 Major depressive disorder, recurrent, unspecified: Secondary | ICD-10-CM

## 2020-01-21 DIAGNOSIS — G250 Essential tremor: Secondary | ICD-10-CM

## 2020-01-21 DIAGNOSIS — Z978 Presence of other specified devices: Secondary | ICD-10-CM | POA: Diagnosis not present

## 2020-01-21 DIAGNOSIS — N401 Enlarged prostate with lower urinary tract symptoms: Secondary | ICD-10-CM | POA: Diagnosis not present

## 2020-01-21 DIAGNOSIS — I10 Essential (primary) hypertension: Secondary | ICD-10-CM

## 2020-01-21 DIAGNOSIS — A0472 Enterocolitis due to Clostridium difficile, not specified as recurrent: Secondary | ICD-10-CM | POA: Diagnosis not present

## 2020-01-21 DIAGNOSIS — G4733 Obstructive sleep apnea (adult) (pediatric): Secondary | ICD-10-CM

## 2020-01-21 NOTE — Progress Notes (Signed)
Provider:  Veleta Miners MD  Location:   [facility] Room Number: 26 Place of Service:  SNF (31)  PCP: Leanna Battles, MD Patient Care Team: Leanna Battles, MD as PCP - General (Internal Medicine) [name], MD (Hematology and Oncology) [name], MD as Attending Physician (Urology) [name], MD (Gastroenterology)  Extended Emergency Contact Information Primary Emergency Contact: Hananiah, Sestito Mobile Phone: [phone] Relation: Son Preferred language: English Interpreter needed? No Secondary Emergency Contact: Kagan,Janet H Address: [address]          [address]          [place], [place] [address] [name] of [place] Phone: [phone] Mobile Phone: [phone] Relation: Spouse  Code Status: Full Code Goals of Care: Advanced Directive information Advanced Directives 01/21/2020  Does Patient Have a Medical Advance Directive? No  Type of Advance Directive -  Does patient want to make changes to medical advance directive? -  Copy of [place] in Chart? -  Would patient like information on creating a medical advance directive? -      Chief Complaint  Patient presents with  . Readmit To SNF    Admission    HPI: Patient is a 86 y.o. male seen today for admission to SNF for therapy. Was admitted in the hospital from 02/8 -02/13 for C Diff Colitis and  Weakness  Patient has number of medical prblems including Chronic Diarrhea, H/O AVM, Barretts Esphagus, OSA on CPAP, Hypertension, h/o Prostate cancer wih Chronic Foley Cathter, Chronic Pain due to Kyphosis and Neuropathy  Per Patient he has h/o Chronic Diarrhea but had some worsening recently. Leading to weakness so much that he could not take care of himself at this apartment and his son decided to take him to ED.No Fever or Abdominal Pain. Was not eating well also. His wife is also in Nursing facility for long term care He was C Diff  positive and Was started on Oral Vanco  Also had c/o Neck Pain. Xray showed Severe Degenerative disease Now has Neck Collar ? UTI with his chronic Foley Was treated short term due to his C Diff  Doing well. Still c/o Diarrhea Feels weak. Looked very tired and Sleepy   Past Medical History:  Diagnosis Date  . Anemia   . Arthritis   . AVM (arteriovenous malformation)   . Barrett's esophagus   . BPH (benign prostatic hyperplasia)   . Cataract   . Constipation   . CPAP (continuous positive airway pressure) dependence   . Depression   . Diverticulosis    diverticular bleeding  . Diverticulosis   . GERD (gastroesophageal reflux disease)   . History of radiation therapy   . HOH (hard of hearing)    wears hearing aids  . Hyperlipidemia   . Hypoglycemia   . Lymphocytosis 06/2012  . Mastodynia   . Murmur    slight  . Nasal congestion   . Osteoporosis   . Osteoporosis   . Prostate cancer ([facility])   . Sleep apnea    wears CPAP  . Urinary retention with incomplete bladder emptying    pt uses bathroom and within 30 mins needs to go again  . Vitamin D deficiency    Past Surgical History:  Procedure Laterality Date  . AMPUTATION TOE Right 09/20/2017   Procedure: Right 4th toe amputation;  Surgeon: Wylene Simmer, MD;  Location: [facility];  Service: Orthopedics;  Laterality: Right;  foot block  .  AMPUTATION TOE Right 02/05/2019   Procedure: RIGHT GREAT TOE AMPUTATION;  Surgeon: Leandrew Koyanagi, MD;  Location: [facility];  Service: Orthopedics;  Laterality: Right;  . BACK SURGERY  04/2013  . back surgury     f  . BLEPHAROPLASTY Bilateral 02/23/15  . CATARACT EXTRACTION    . COLONOSCOPY    . COLONOSCOPY N/A 10/02/2017   Procedure: COLONOSCOPY;  Surgeon: Doran Stabler, MD;  Location: [facility];  Service: Gastroenterology;  Laterality: N/A;  . COLONOSCOPY WITH PROPOFOL N/A 10/01/2017   Procedure: COLONOSCOPY WITH PROPOFOL;  Surgeon: Doran Stabler, MD;  Location: [facility];  Service: Gastroenterology;  Laterality: N/A;  . COLONOSCOPY WITH PROPOFOL N/A 03/07/2018   Procedure: COLONOSCOPY WITH PROPOFOL;  Surgeon: [name], MD;  Location: [facility];  Service: Endoscopy;  Laterality: N/A;  . ESOPHAGOGASTRODUODENOSCOPY (EGD) WITH PROPOFOL N/A 10/01/2017   Procedure: ESOPHAGOGASTRODUODENOSCOPY (EGD) WITH PROPOFOL;  Surgeon: Doran Stabler, MD;  Location: [facility];  Service: Gastroenterology;  Laterality: N/A;  . EYE SURGERY Bilateral   . FINGER SURGERY Right    right thumb  . HARDWARE REMOVAL Right 05/15/2016   Procedure: Right Lumbar Two-Lumbar Five Removal of Hardware;  Surgeon: Kristeen Miss, MD;  Location: [facility];  Service: Neurosurgery;  Laterality: Right;  Right L2-5 removal of hardware  . INGUINAL HERNIA REPAIR     x4  . IR RADIOLOGIST EVAL & MGMT  07/10/2018  . IR RADIOLOGIST EVAL & MGMT  09/05/2018  . IR SACROPLASTY BILATERAL  07/15/2018  . REPAIR SPIGELIAN HERNIA  2011  . [place] ,2014  . TONSILLECTOMY    . UPPER GASTROINTESTINAL ENDOSCOPY    . VASECTOMY      reports that he has never smoked. He has never used smokeless tobacco. He reports that he does not drink alcohol or use drugs. Social History   Socioeconomic History  . Marital status: Married    Spouse name: [name]  . Number of children: 3  . Years of education: College  . Highest education level: Not on file  Occupational History  . 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 Smoking status: Never Smoker  . Smokeless tobacco: Never Used  Substance and Sexual Activity  . Alcohol use: No  . Drug use: No  . Sexual activity: Never  Other Topics Concern  . Not on file  Social History Narrative   Patient is married [name]).   Patient is a retired Arboriculturist.   Patient lives in [facility] living facility.   Patient has three adult children.   Patient does not drink any caffeine.    Patient is right-handed.   Social Determinants of Health   Financial Resource Strain:   . Difficulty of Paying Living Expenses: Not on file  Food Insecurity:   . Worried About Charity fundraiser in the Last Year: Not on file  . Ran Out of Food in the Last Year: Not on file  Transportation Needs:   . Lack of Transportation (Medical): Not on file  . Lack of Transportation (Non-Medical): Not on file  Physical Activity:   . Days of Exercise per Week: Not on file  . Minutes of Exercise per Session: Not on file  Stress:   . Feeling of Stress : Not on file  Social Connections:   . Frequency of Communication with Friends and Family: Not on file  . Frequency  of Social Gatherings with Friends and Family: Not on file  . Attends Religious Services: Not on file  . Active Member of Clubs or Organizations: Not on file  . Attends Archivist Meetings: Not on file  . Marital Status: Not on file  Intimate Partner Violence:   . Fear of Current or Ex-Partner: Not on file  . Emotionally Abused: Not on file  . Physically Abused: Not on file  . Sexually Abused: Not on file    Functional Status Survey:    Family History  Problem Relation Age of Onset  . Prostate cancer Father   . CAD Mother   . Arthritis-Osteo Brother   . Colon cancer Neg Hx   . Stomach cancer Neg Hx     Health Maintenance  Topic Date Due  . FOOT EXAM  08/13/1941  . OPHTHALMOLOGY EXAM  08/13/1941  . URINE MICROALBUMIN  08/13/1941  . TETANUS/TDAP  08/08/2028  . INFLUENZA VACCINE  Completed  . PNA vac Low Risk Adult  Completed  . HEMOGLOBIN A1C  Discontinued    Allergies  Allergen Reactions  . Celebrex [Celecoxib] Other (See Comments)    Created stomach ulcers.  Yvette Rack [Cyclobenzaprine] Other (See Comments)    Caught in hiatal hernia and caused extreme pain    Allergies as of 01/21/2020      Reactions   Celebrex [celecoxib] Other (See Comments)   Created stomach ulcers.   Flexeril  [cyclobenzaprine] Other (See Comments)   Caught in hiatal hernia and caused extreme pain      Medication List       Accurate as of January 21, 2020  9:09 AM. If you have any questions, ask your nurse or doctor.        acetaminophen 500 MG tablet Commonly known as: TYLENOL Take 500 mg by mouth every 8 (eight) hours.   buPROPion 150 MG 12 hr tablet Commonly known as: WELLBUTRIN SR Take 150 mg by mouth 2 (two) times daily.   CENTRUM ADULTS PO Take 1 tablet by mouth daily.   PRESERVISION AREDS 2 PO Take 1 capsule by mouth 2 (two) times daily.   cholecalciferol 1000 units tablet Commonly known as: VITAMIN D Take 1,000 Units by mouth daily.   diclofenac Sodium 1 % Gel Commonly known as: VOLTAREN Apply 2 g topically 4 (four) times daily.   gabapentin 300 MG capsule Commonly known as: NEURONTIN Take 300 mg by mouth 3 (three) times daily.   HYDROcodone-acetaminophen 5-325 MG tablet Commonly known as: NORCO/VICODIN Take 1 tablet by mouth every 6 (six) hours as needed for moderate pain.   lovastatin 20 MG tablet Commonly known as: MEVACOR Take 20 mg by mouth at bedtime.   methocarbamol 500 MG tablet Commonly known as: ROBAXIN Take 500 mg by mouth every 6 (six) hours as needed for muscle spasms.   pantoprazole 40 MG tablet Commonly known as: PROTONIX Take 40 mg by mouth 2 (two) times daily.   propranolol 10 MG tablet Commonly known as: INDERAL Take 10 mg by mouth daily.   Refresh Repair 0.5-0.9 % ophthalmic solution Generic drug: carboxymethylcellul-glycerin Place 1 drop into both eyes 3 (three) times daily.   rOPINIRole 1 MG tablet Commonly known as: REQUIP TAKE 2 TABLETS(2 MG) BY MOUTH AT BEDTIME What changed: See the new instructions.   traMADol 50 MG tablet Commonly known as: ULTRAM Take 2 tablets (100 mg total) by mouth every 12 (twelve) hours as needed (For pain.). Hold for sedation   vancomycin 50  mg/mL  oral solution Commonly known as:  VANCOCIN Take 2.5 mLs (125 mg total) by mouth 4 (four) times daily for 7 days.   zinc oxide 20 % ointment Apply 1 application topically as needed for irritation.       Review of Systems  Constitutional: Positive for activity change and appetite change.  HENT: Negative.   Respiratory: Negative.   Cardiovascular: Negative.   Gastrointestinal: Positive for diarrhea.  Genitourinary: Negative.   Musculoskeletal: Positive for back pain, gait problem, myalgias, neck pain and neck stiffness.  Skin: Negative.   Neurological: Positive for weakness.  Psychiatric/Behavioral: Positive for confusion and dysphoric mood.    Vitals:   01/21/20 0900  BP: (!) 151/86  Pulse: 66  Resp: 16  Temp: (!) 97.1 F (36.2 C)  SpO2: 97%  Weight: 163 lb (73.9 kg)  Height: 5\' 6"  (1.676 m)   Body mass index is 26.31 kg/m. Physical Exam Vitals reviewed.  Constitutional:      Appearance: Normal appearance.  HENT:     Head: Normocephalic.     Nose: Nose normal.     Mouth/Throat:     Mouth: Mucous membranes are moist.     Pharynx: Oropharynx is clear.  Eyes:     Pupils: Pupils are equal, round, and reactive to light.  Cardiovascular:     Rate and Rhythm: Normal rate and regular rhythm.     Pulses: Normal pulses.  Pulmonary:     Effort: Pulmonary effort is normal. No respiratory distress.     Breath sounds: Normal breath sounds. No wheezing or rales.  Abdominal:     General: Abdomen is flat. Bowel sounds are normal.     Palpations: Abdomen is soft.  Musculoskeletal:        General: Swelling present.     Cervical back: Neck supple.     Comments: Mild Edema Bilateral S/P Toe Amputation in Right Feet  Skin:    General: Skin is warm.  Neurological:     General: No focal deficit present.     Mental Status: He is alert and oriented to person, place, and time.  Psychiatric:        Mood and Affect: Mood normal.     Labs reviewed: Basic Metabolic Panel: Recent Labs    01/15/20 0240  01/16/20 0243 01/17/20 0241  NA 135 135 135  K 3.9 4.2 3.8  CL 111 112* 108  CO2 16* 18* 20*  GLUCOSE 98 126* 100*  BUN 11 11 12   CREATININE 0.74 0.70 0.61  CALCIUM 7.6* 7.6* 7.7*  MG 1.8 1.8 1.7   Liver Function Tests: Recent Labs    01/12/20 2110  AST 38  ALT 18  ALKPHOS 76  BILITOT 0.6  PROT 6.5  ALBUMIN 3.0*   Recent Labs    01/12/20 2110  LIPASE 15   No results for input(s): AMMONIA in the last 8760 hours. CBC: Recent Labs    01/15/20 0240 01/16/20 0243 01/17/20 0241  WBC 18.0* 16.8* 11.6*  NEUTROABS 15.7* 14.4* 9.4*  HGB 11.2* 10.7* 10.9*  HCT 33.9* 33.2* 34.0*  MCV 91.1 93.0 91.6  PLT 256 252 256   Cardiac Enzymes: No results for input(s): CKTOTAL, CKMB, CKMBINDEX, TROPONINI in the last 8760 hours. BNP: Invalid input(s): POCBNP Lab Results  Component Value Date   HGBA1C 5.2 08/29/2018   Lab Results  Component Value Date   TSH 2.35 07/08/2018   Lab Results  Component Value Date   VITAMINB12 314 07/25/2013   Lab  Results  Component Value Date   FOLATE >24.8 07/25/2013   Lab Results  Component Value Date   IRON 19 (L) 07/25/2013   FERRITIN 8.1 (L) 07/25/2013    Imaging and Procedures obtained prior to SNF admission: No results found.  Assessment/Plan C. difficile diarrhea Per Nurses he is having loose stool but just 1 or 2 times a day Continue to monitor Finishing his Vancomycin treatment total 10 days. Last day is 02/20 Has Follow up with GI in 4 weeks  Neuropathy His Nuerontin was increased yesterday  Seems sleepy today Will decrease the morning dose to 100 mg in Am  Continue 300 mg BID  Tremor, essential Continue on Inderal  Benign prostatic hyperplasia with urinary obstruction Has Chronic Foley.  He is worried that Nursing staff would not be able to change it due his H/o Stricture  Anemia of chronic disease With Low Ferritin Was on Iron  Taken off Will consider to restart later once his Diarrhea better  OSA on  CPAP Continue on CPAP  Depression, recurrent ([facility]) On Wellbutrin  Chronic pain syndrome On High Dose of PRN Robaxin and Norco and Tramadol  Hyperlipidemia Continue on Statin  Generalized deconditioning Will continue Therapy. Most likely discharge to [facility]    Family/ staff Communication:   Labs/tests ordered:

## 2020-02-03 DIAGNOSIS — Z20828 Contact with and (suspected) exposure to other viral communicable diseases: Secondary | ICD-10-CM | POA: Diagnosis not present

## 2020-02-05 ENCOUNTER — Non-Acute Institutional Stay (SKILLED_NURSING_FACILITY): Payer: Medicare Other | Admitting: Internal Medicine

## 2020-02-05 ENCOUNTER — Encounter: Payer: Self-pay | Admitting: Internal Medicine

## 2020-02-05 DIAGNOSIS — N138 Other obstructive and reflux uropathy: Secondary | ICD-10-CM

## 2020-02-05 DIAGNOSIS — G894 Chronic pain syndrome: Secondary | ICD-10-CM

## 2020-02-05 DIAGNOSIS — G250 Essential tremor: Secondary | ICD-10-CM

## 2020-02-05 DIAGNOSIS — N401 Enlarged prostate with lower urinary tract symptoms: Secondary | ICD-10-CM | POA: Diagnosis not present

## 2020-02-05 DIAGNOSIS — A0472 Enterocolitis due to Clostridium difficile, not specified as recurrent: Secondary | ICD-10-CM

## 2020-02-05 DIAGNOSIS — Z9989 Dependence on other enabling machines and devices: Secondary | ICD-10-CM | POA: Diagnosis not present

## 2020-02-05 DIAGNOSIS — R531 Weakness: Secondary | ICD-10-CM

## 2020-02-05 DIAGNOSIS — F339 Major depressive disorder, recurrent, unspecified: Secondary | ICD-10-CM | POA: Diagnosis not present

## 2020-02-05 DIAGNOSIS — G629 Polyneuropathy, unspecified: Secondary | ICD-10-CM | POA: Diagnosis not present

## 2020-02-05 DIAGNOSIS — D638 Anemia in other chronic diseases classified elsewhere: Secondary | ICD-10-CM

## 2020-02-05 DIAGNOSIS — G4733 Obstructive sleep apnea (adult) (pediatric): Secondary | ICD-10-CM | POA: Diagnosis not present

## 2020-02-05 NOTE — Progress Notes (Signed)
Location:   [place] Room Number: 26 Place of Service:  SNF [phone]) Provider:  Veleta Miners MD  Leanna Battles, MD  Patient Care Team: Leanna Battles, MD as PCP - General (Internal Medicine) [name], MD (Hematology and Oncology) [name], MD as Attending Physician (Urology) [name], MD (Gastroenterology)  Extended Emergency Contact Information Primary Emergency Contact: Charles, [name] Mobile Phone: [phone] Relation: Son Preferred language: English Interpreter needed? No Secondary Emergency Contact: [name],Charles H Address: [address]          [address]          [place],  [address] Charles [name] of [place] Phone: [phone] Mobile Phone: [phone] Relation: Spouse  Code Status:  Full Code Goals of care: Advanced Directive information Advanced Directives 01/21/2020  Does Patient Have a Medical Advance Directive? No  Type of Advance Directive -  Does patient want to make changes to medical advance directive? -  Copy of [place] in Chart? -  Would patient like information on creating a medical advance directive? -     Chief Complaint  Patient presents with  . Acute Visit    Follow Up    HPI:  Pt is a 86 y.o. [name] seen today for an acute visit for Follow up and C/o Weakness and Discharge Planning  Was admitted in the hospital from 02/8 -02/13 for C Diff Colitis and  Weakness  Patient has number of medical prblems including Chronic Diarrhea, H/O AVM, Barretts Esphagus, OSA on CPAP, Hypertension, h/o Prostate cancer wih Chronic Foley Cathter, Chronic Pain due to Kyphosis and Neuropathy  Recent diagnosis of C. Difficile colitis Treated with 10 days of vancomycin oral Does not have any more diarrhea. Chronic Foley The catheter was recently changed Complaining of weakness and decreased Appetite.  Is working with still dependent for his transfers.  And needing help with his  ADLs Pain seems to be controlled. Wants to know if he would be able to go to assisted living   Past Medical History:  Diagnosis Date  . Anemia   . Arthritis   . AVM (arteriovenous malformation)   . Barrett's esophagus   . BPH (benign prostatic hyperplasia)   . Cataract   . Constipation   . CPAP (continuous positive airway pressure) dependence   . Depression   . Diverticulosis    diverticular bleeding  . Diverticulosis   . GERD (gastroesophageal reflux disease)   . History of radiation therapy   . HOH (hard of hearing)    wears hearing aids  . Hyperlipidemia   . Hypoglycemia   . Lymphocytosis 06/2012  . Mastodynia   . Murmur    slight  . Nasal congestion   . Osteoporosis   . Osteoporosis   . Prostate cancer ([place])   . Sleep apnea    wears CPAP  . Urinary retention with incomplete bladder emptying    pt uses bathroom and within 30 mins needs to go again  . Vitamin D deficiency    Past Surgical History:  Procedure Laterality Date  . AMPUTATION TOE Right 09/20/2017   Procedure: Right 4th toe amputation;  Surgeon: Wylene Simmer, MD;  Location: [facility];  Service: Orthopedics;  Laterality: Right;  foot block  . AMPUTATION TOE Right 02/05/2019   Procedure: RIGHT GREAT TOE AMPUTATION;  Surgeon: Leandrew Koyanagi, MD;  Location: [facility];  Service: Orthopedics;  Laterality: Right;  . BACK SURGERY  04/2013  .  back surgury     f  . BLEPHAROPLASTY Bilateral 02/23/15  . CATARACT EXTRACTION    . COLONOSCOPY    . COLONOSCOPY N/A 10/02/2017   Procedure: COLONOSCOPY;  Surgeon: Doran Stabler, MD;  Location: [facility];  Service: Gastroenterology;  Laterality: N/A;  . COLONOSCOPY WITH PROPOFOL N/A 10/01/2017   Procedure: COLONOSCOPY WITH PROPOFOL;  Surgeon: Doran Stabler, MD;  Location: [facility];  Service: Gastroenterology;  Laterality: N/A;  . COLONOSCOPY WITH PROPOFOL N/A 03/07/2018   Procedure: COLONOSCOPY WITH PROPOFOL;  Surgeon: [name], MD;  Location: [facility];  Service: Endoscopy;  Laterality: N/A;  . ESOPHAGOGASTRODUODENOSCOPY (EGD) WITH PROPOFOL N/A 10/01/2017   Procedure: ESOPHAGOGASTRODUODENOSCOPY (EGD) WITH PROPOFOL;  Surgeon: Doran Stabler, MD;  Location: [facility];  Service: Gastroenterology;  Laterality: N/A;  . EYE SURGERY Bilateral   . FINGER SURGERY Right    right thumb  . HARDWARE REMOVAL Right 05/15/2016   Procedure: Right Lumbar Two-Lumbar Five Removal of Hardware;  Surgeon: Kristeen Miss, MD;  Location: [facility];  Service: Neurosurgery;  Laterality: Right;  Right L2-5 removal of hardware  . INGUINAL HERNIA REPAIR     x4  . IR RADIOLOGIST EVAL & MGMT  07/10/2018  . IR RADIOLOGIST EVAL & MGMT  09/05/2018  . IR SACROPLASTY BILATERAL  07/15/2018  . REPAIR SPIGELIAN HERNIA  2011  . [place] ,2014  . TONSILLECTOMY    . UPPER GASTROINTESTINAL ENDOSCOPY    . VASECTOMY      Allergies  Allergen Reactions  . Celebrex [Celecoxib] Other (See Comments)    Created stomach ulcers.  Yvette Rack [Cyclobenzaprine] Other (See Comments)    Caught in hiatal hernia and caused extreme pain    Allergies as of 02/05/2020      Reactions   Celebrex [celecoxib] Other (See Comments)   Created stomach ulcers.   Flexeril [cyclobenzaprine] Other (See Comments)   Caught in hiatal hernia and caused extreme pain      Medication List       Accurate as of February 05, 2020 11:03 AM. If you have any questions, ask your nurse or doctor.        STOP taking these medications   methocarbamol 500 MG tablet Commonly known as: ROBAXIN Stopped by: [name], MD     TAKE these medications   acetaminophen 500 MG tablet Commonly known as: TYLENOL Take 500 mg by mouth 3 (three) times daily.   buPROPion 150 MG 12 hr tablet Commonly known as: WELLBUTRIN SR Take 150 mg by mouth 2 (two) times daily.   CENTRUM ADULTS PO Take 1 tablet by mouth daily.   PRESERVISION AREDS 2 PO Take 1 capsule by  mouth 2 (two) times daily.   cholecalciferol 1000 units tablet Commonly known as: VITAMIN D Take 1,000 Units by mouth daily.   diclofenac Sodium 1 % Gel Commonly known as: VOLTAREN Apply 2 g topically 4 (four) times daily.   gabapentin 300 MG capsule Commonly known as: NEURONTIN Take 300 mg by mouth 2 (two) times daily.   gabapentin 100 MG capsule Commonly known as: NEURONTIN Take 100 mg by mouth every morning.   HYDROcodone-acetaminophen 5-325 MG tablet Commonly known as: NORCO/VICODIN Take 1 tablet by mouth every 6 (six) hours as needed for moderate pain.   lovastatin 20 MG tablet Commonly known as: MEVACOR Take 20 mg by mouth at bedtime.   pantoprazole 40 MG tablet Commonly known as: PROTONIX Take 40 mg  by mouth every morning.   propranolol 10 MG tablet Commonly known as: INDERAL Take 10 mg by mouth daily.   Refresh Repair 0.5-0.9 % ophthalmic solution Generic drug: carboxymethylcellul-glycerin Place 1 drop into both eyes 3 (three) times daily.   rOPINIRole 1 MG tablet Commonly known as: REQUIP TAKE 2 TABLETS(2 MG) BY MOUTH AT BEDTIME What changed: See the new instructions.   traMADol 50 MG tablet Commonly known as: ULTRAM Take 2 tablets (100 mg total) by mouth every 12 (twelve) hours as needed (For pain.). Hold for sedation   zinc oxide 20 % ointment Apply 1 application topically as needed for irritation.       Review of Systems  Constitutional: Positive for appetite change.  HENT: Negative.   Respiratory: Negative.   Cardiovascular: Positive for leg swelling.  Gastrointestinal: Negative.   Genitourinary: Negative.   Musculoskeletal: Positive for back pain and gait problem.  Skin: Negative.   Neurological: Positive for weakness.  Psychiatric/Behavioral: Positive for decreased concentration and dysphoric mood.  All other systems reviewed and are negative.   Immunization History  Administered Date(s) Administered  . Influenza, High Dose Seasonal  PF 09/17/2019  . Influenza-Unspecified 09/16/2018  . Moderna SARS-COVID-2 Vaccination 12/08/2019, 01/05/2020  . Pneumococcal Conjugate-13 12/29/2013  . Pneumococcal Polysaccharide-23 09/08/2005  . Tdap 05/25/2005, 08/08/2018  . Zoster Recombinat (Shingrix) 10/17/2017, 01/25/2018   Pertinent  Health Maintenance Due  Topic Date Due  . FOOT EXAM  08/13/1941  . OPHTHALMOLOGY EXAM  08/13/1941  . URINE MICROALBUMIN  08/13/1941  . INFLUENZA VACCINE  Completed  . PNA vac Low Risk Adult  Completed  . HEMOGLOBIN A1C  Discontinued   Fall Risk  01/01/2019 09/24/2018 01/04/2017 10/06/2016 09/29/2016  Falls in the past year? 0 No No No No   Functional Status Survey:    Vitals:   02/05/20 1053  BP: 127/64  Pulse: 61  Resp: 19  Temp: 97.8 F (36.6 C)  SpO2: 96%  Weight: 168 lb 1.6 oz (76.2 kg)  Height: 5\' 6"  (1.676 m)   Body mass index is 27.13 kg/m. Physical Exam Vitals reviewed.  Constitutional:      Appearance: Normal appearance.  HENT:     Head: Normocephalic.     Nose: Nose normal.     Mouth/Throat:     Mouth: Mucous membranes are moist.     Pharynx: Oropharynx is clear.  Eyes:     Pupils: Pupils are equal, round, and reactive to light.  Cardiovascular:     Rate and Rhythm: Normal rate.     Pulses: Normal pulses.  Pulmonary:     Effort: Pulmonary effort is normal.     Breath sounds: Normal breath sounds.  Abdominal:     General: Abdomen is flat. Bowel sounds are normal.     Palpations: Abdomen is soft.  Musculoskeletal:        General: No swelling.     Cervical back: Neck supple.  Skin:    General: Skin is warm.  Neurological:     General: No focal deficit present.     Mental Status: He is alert and oriented to person, place, and time.  Psychiatric:        Mood and Affect: Mood normal.        Thought Content: Thought content normal.     Labs reviewed: Recent Labs    01/15/20 0240 01/16/20 0243 01/17/20 0241  NA 135 135 135  K 3.9 4.2 3.8  CL 111 112*  108  CO2 16* 18*  20*  GLUCOSE 98 126* 100*  BUN 11 11 12   CREATININE 0.74 0.70 0.61  CALCIUM 7.6* 7.6* 7.7*  MG 1.8 1.8 1.7   Recent Labs    01/12/20 2110  AST 38  ALT 18  ALKPHOS 76  BILITOT 0.6  PROT 6.5  ALBUMIN 3.0*   Recent Labs    01/15/20 0240 01/16/20 0243 01/17/20 0241  WBC 18.0* 16.8* 11.6*  NEUTROABS 15.7* 14.4* 9.4*  HGB 11.2* 10.7* 10.9*  HCT 33.9* 33.2* 34.0*  MCV 91.1 93.0 91.6  PLT 256 252 256   Lab Results  Component Value Date   TSH 2.35 07/08/2018   Lab Results  Component Value Date   HGBA1C 5.2 08/29/2018   Lab Results  Component Value Date   CHOL 158 08/29/2018   HDL 43 08/29/2018   LDLCALC 98 08/29/2018   TRIG 76 08/29/2018    Significant Diagnostic Results in last 30 days:  DG Cervical Spine 2 or 3 views  Result Date: 01/15/2020 CLINICAL DATA:  Neck pain. EXAM: CERVICAL SPINE - 2-3 VIEW COMPARISON:  July 03, 2016. FINDINGS: Mild grade 1 anterolisthesis of C4-5 is noted secondary to posterior facet joint hypertrophy. No definite fracture is noted. No prevertebral soft tissue swelling is noted. Severe degenerative disc disease is noted at C5-6 and C6-7 with anterior osteophyte formation. IMPRESSION: Severe multilevel degenerative disc disease. No acute abnormality seen in the cervical spine. Electronically Signed   By: Marijo Conception M.D.   On: 01/15/2020 13:44    Assessment/Plan C. difficile diarrhea No Loose Stool per Nurses in facility Has Follow up with GI in 2 weeks  Weakness ? Etiology Will check CBC, CMP Working with Therapy Discontinue tramadol and Robaxin  Neuropathy On Neurontin Will Get B12 and TSH level  Tremor, essential Continue Propanolol  Benign prostatic hyperplasia with urinary obstruction Foley Changed recently  Anemia of chronic disease Repeat CBC Would need to be started on Iron OSA on CPAP On CPAP Depression, recurrent (HCC) Wellbutrin Chronic pain syndrome Discontinue Robaxin and  Tramdol Continue on Norco PRN Discharge planning Patient will continue therapy will evaluate to see in few weeks if you would be go to assisted living.  I discussed with him that it would be difficult for him to go back to his apartment  Family/ staff Communication:   Labs/tests ordered: CBC,CMP, TSH and B12 level  Total time spent in this patient care encounter was  _45  minutes; greater than 50% of the visit spent counseling patient and staff, reviewing records , Labs and coordinating care for problems addressed at this encounter.

## 2020-02-08 DIAGNOSIS — Z20828 Contact with and (suspected) exposure to other viral communicable diseases: Secondary | ICD-10-CM | POA: Diagnosis not present

## 2020-02-09 LAB — BASIC METABOLIC PANEL
BUN: 17 (ref 4–21)
CO2: 24 — AB (ref 13–22)
Chloride: 105 (ref 99–108)
Creatinine: 0.7 (ref 0.6–1.3)
Glucose: 70
Potassium: 4.1 (ref 3.4–5.3)
Sodium: 138 (ref 137–147)

## 2020-02-09 LAB — COMPREHENSIVE METABOLIC PANEL
Albumin: 3.4 — AB (ref 3.5–5.0)
Calcium: 8.9 (ref 8.7–10.7)
Globulin: 2.9

## 2020-02-09 LAB — CBC AND DIFFERENTIAL
HCT: 41 (ref 41–53)
Hemoglobin: 13 — AB (ref 13.5–17.5)
Neutrophils Absolute: 4204
Platelets: 272 (ref 150–399)
WBC: 6.6

## 2020-02-09 LAB — VITAMIN B12: Vitamin B-12: 352

## 2020-02-09 LAB — HEPATIC FUNCTION PANEL
ALT: 10 (ref 10–40)
AST: 31 (ref 14–40)
Alkaline Phosphatase: 98 (ref 25–125)
Bilirubin, Total: 0.6

## 2020-02-09 LAB — CBC: RBC: 4.49 (ref 3.87–5.11)

## 2020-02-09 LAB — TSH: TSH: 2.38 (ref 0.41–5.90)

## 2020-02-11 ENCOUNTER — Telehealth: Payer: Self-pay

## 2020-02-11 ENCOUNTER — Other Ambulatory Visit: Payer: Self-pay

## 2020-02-11 ENCOUNTER — Encounter: Payer: Medicare Other | Admitting: Adult Health

## 2020-02-11 NOTE — Telephone Encounter (Signed)
RN with [facility] facility called to reschedule pts apt and to verify why the apt will be virtual vs. In person

## 2020-02-11 NOTE — Telephone Encounter (Signed)
I called and spoke to Charles [name] at [facility].  Pt has stated that the cpap pressure is too high.  From the cpap download MM/NP states showing a leak, and pressure is not meeting the max of 20.  Recommends mask refitting, and supplies.  Needs order for this, cant send to [facility]. I placed order for cpap mask fitting and supplies if needed.

## 2020-02-11 NOTE — Telephone Encounter (Signed)
His download showed that he use his machine 50 out of the last 60 days for compliance of 83%.  He uses machine greater than 4 hours on 45 days for compliance of 75%.  On average he uses his machine 6 hours and 10 minutes.  His residual AHI is 17 on 8 to 20 cm of water with EPR 1.  His leak in the 95th percentile is 85.9 L/min.  I spoke to [facility], advised that he has a high leak.  It is questionable whether he is putting on his mask correctly or he may need a mask refitting.  She called the facility and made them aware

## 2020-02-11 NOTE — Addendum Note (Signed)
Addended by: Brandon Melnick on: 02/11/2020 03:54 PM   Modules accepted: Orders

## 2020-02-11 NOTE — Telephone Encounter (Signed)
Pt arrived today for his appointment with NP Ward Givens for CPAP. Pt did not bring his CPAP report/nor machine.  Patient will need his machine/card for next appointment.   Called and had transportation p/u. Will go ahead and schedule an appointment for him.   Called 2-3x times left VM  Coworker is going to assist with r/s his appointment.

## 2020-02-18 ENCOUNTER — Ambulatory Visit (INDEPENDENT_AMBULATORY_CARE_PROVIDER_SITE_OTHER): Payer: Medicare Other | Admitting: Gastroenterology

## 2020-02-18 ENCOUNTER — Encounter: Payer: Self-pay | Admitting: Gastroenterology

## 2020-02-18 ENCOUNTER — Other Ambulatory Visit: Payer: Self-pay

## 2020-02-18 VITALS — BP 92/58 | HR 56 | Temp 97.3°F

## 2020-02-18 DIAGNOSIS — A0472 Enterocolitis due to Clostridium difficile, not specified as recurrent: Secondary | ICD-10-CM | POA: Diagnosis not present

## 2020-02-18 MED ORDER — VANCOMYCIN HCL 125 MG PO CAPS: ORAL_CAPSULE | ORAL | 0 refills | Status: DC

## 2020-02-18 MED ORDER — SACCHAROMYCES BOULARDII 250 MG PO CAPS: 250.0000 mg | ORAL_CAPSULE | Freq: Two times a day (BID) | ORAL | 0 refills | Status: AC

## 2020-02-18 NOTE — Telephone Encounter (Signed)
Joelyn Oms at [facility] states she has not heard from [place] yet and she would like a call back from [place] @ [phone]

## 2020-02-18 NOTE — Progress Notes (Signed)
    History of Present Illness: This is an 86 year old male with C. difficile colitis.  He is accompanied by his son.  He is a resident at [facility].  He was diagnosed with C. difficile in February during hospitalization and treated with a 10-day course of vancomycin.  This followed treatment for UTI.  He is a resident of [facility].  He relates ongoing difficulties with multiple soft, mushy stools each day without bleeding.  He has frequent incontinence of small amounts of fecal material.  He states that his symptoms did not improve while taking vancomycin.   Current Medications, Allergies, Past Medical History, Past Surgical History, Family History and Social History were reviewed in [employer] record.   Physical Exam: General: Well developed, thin, debilitated, in wheelchair, no acute distress Head: Normocephalic and atraumatic Eyes:  sclerae anicteric, EOMI Ears: Normal auditory acuity Mouth: Not examined, mask on during Covid-19 pandemic Lungs: Clear throughout to auscultation Heart: Regular rate and rhythm; no murmurs, rubs or bruits Abdomen: Soft, non tender and non distended. No masses, hepatosplenomegaly or hernias noted. Normal Bowel sounds Rectal: Not done Musculoskeletal: Symmetrical with no gross deformities  Pulses:  Normal pulses noted Extremities: No clubbing, cyanosis, edema or deformities noted Neurological: Alert oriented x 4, grossly nonfocal Psychological:  Alert and cooperative. Normal mood and affect   Assessment and Recommendations:  1.  C. difficile colitis which did not adequately respond to a 10-day course of vancomycin.  Begin a vancomycin pulse taper course with a 49-month course of Florastor twice daily.  Modify diet to a lactose-free, fat modified, no raw fruits, no raw vegetables diet until symptoms improve and then may liberalize diet. REV in 2 months.

## 2020-02-18 NOTE — Patient Instructions (Signed)
We have sent the following medications to your pharmacy for you to pick up at your convenience: Vancomycin pulse taper: One tablet by mouth four times a day x 14 days One tablet by mouth twice daily x 7 days One tablet by mouth daily x 7 days  One tablet by mouth every other day x 1 month, then stop  Also, start Florastor 250 mg one tablet by mouth twice daily x 2 months.  Start Lactose free diet and avoid raw fruits and vegetables.   Thank you for choosing me and [facility].  Pricilla Riffle. Dagoberto Ligas., MD., [facility] Regal

## 2020-02-18 NOTE — Telephone Encounter (Signed)
I called [facility] and pt has not been active since 2017 with them.  I called and spoke to [facility] with [facility] and faxed to them orders for mask refitting and supplies.

## 2020-02-18 NOTE — Addendum Note (Signed)
Addended by: Brandon Melnick on: 02/18/2020 02:02 PM   Modules accepted: Orders

## 2020-02-18 NOTE — Telephone Encounter (Signed)
I called Charles [name] at [facility] and relayed that I called [facility] and he has not been serviced by them since 2017,  [facility] he was last given supplies in 11/2019 (I spoke to Aline B).  I faxed to them the orders and will also CM then as well.  She verbalized understanding.

## 2020-02-19 ENCOUNTER — Non-Acute Institutional Stay (SKILLED_NURSING_FACILITY): Payer: Medicare Other | Admitting: Internal Medicine

## 2020-02-19 ENCOUNTER — Encounter: Payer: Self-pay | Admitting: Internal Medicine

## 2020-02-19 DIAGNOSIS — R531 Weakness: Secondary | ICD-10-CM | POA: Diagnosis not present

## 2020-02-19 DIAGNOSIS — G894 Chronic pain syndrome: Secondary | ICD-10-CM

## 2020-02-19 DIAGNOSIS — Z9989 Dependence on other enabling machines and devices: Secondary | ICD-10-CM

## 2020-02-19 DIAGNOSIS — R634 Abnormal weight loss: Secondary | ICD-10-CM

## 2020-02-19 DIAGNOSIS — N138 Other obstructive and reflux uropathy: Secondary | ICD-10-CM

## 2020-02-19 DIAGNOSIS — G4733 Obstructive sleep apnea (adult) (pediatric): Secondary | ICD-10-CM

## 2020-02-19 DIAGNOSIS — G629 Polyneuropathy, unspecified: Secondary | ICD-10-CM | POA: Diagnosis not present

## 2020-02-19 DIAGNOSIS — N401 Enlarged prostate with lower urinary tract symptoms: Secondary | ICD-10-CM | POA: Diagnosis not present

## 2020-02-19 DIAGNOSIS — A0472 Enterocolitis due to Clostridium difficile, not specified as recurrent: Secondary | ICD-10-CM | POA: Diagnosis not present

## 2020-02-19 NOTE — Progress Notes (Signed)
A user error has taken place.

## 2020-02-19 NOTE — Progress Notes (Signed)
Location:  [facility] Room Number: [place] of Service:  SNF [phone]) Provider:  Veleta Miners, MD  Patient Care Team: Leanna Battles, MD as PCP - General (Internal Medicine) [name], MD (Hematology and Oncology) [name], MD as Attending Physician (Urology) [name], MD (Gastroenterology)  Extended Emergency Contact Information Primary Emergency Contact: Emil, Stahlnecker Mobile Phone: [phone] Relation: Son Preferred language: English Interpreter needed? No Secondary Emergency Contact: Kotarski,Janet H Address: [address]          [address]          [place], [place] [address] [name] of [place] Phone: [phone] Mobile Phone: [phone] Relation: Spouse  Code Status:  Full Code Goals of care: Advanced Directive information Advanced Directives 01/21/2020  Does Patient Have a Medical Advance Directive? No  Type of Advance Directive -  Does patient want to make changes to medical advance directive? -  Copy of [place] in Chart? -  Would patient like information on creating a medical advance directive? -     Chief Complaint  Patient presents with  . Acute Visit    Patient is seen for Follow up    HPI:  Pt is an 86 y.o. male seen today for an acute visit for Follow up   Was admitted in the hospital from 02/8 -02/13 for C Diff Colitis and Weakness Patient has number of medical prblems including Chronic Diarrhea, H/O AVM, Barretts Esphagus, OSA on CPAP, Hypertension, h/o Prostate cancer wih Chronic Foley Cathter, Chronic Pain due to Kyphosis andNeuropathy  C Diff Colitis He was treated with 10 days of oral vancomycin.  Bed was seen by Dr. Fuller Plan yesterday and told him that he still having the same diarrhea.  So he has put him on Pulse  vancomycin for at least 8 weeks.  Patient is slightly upset because he cannot see his wife who is in the same hallway.  He also has to follow modified  diet Weight loss and poor appetite He has lost almost 10 pounds since he has been here and continues to complain of poor appetite Neuropathy and chronic pain That seems to be controlled at this time Weakness Still completely dependent for his ADLs and transfers  Past Medical History:  Diagnosis Date  . Anemia   . Arthritis   . AVM (arteriovenous malformation)   . Barrett's esophagus   . BPH (benign prostatic hyperplasia)   . Cataract   . Constipation   . CPAP (continuous positive airway pressure) dependence   . Depression   . Diverticulosis    diverticular bleeding  . Diverticulosis   . GERD (gastroesophageal reflux disease)   . History of radiation therapy   . HOH (hard of hearing)    wears hearing aids  . Hyperlipidemia   . Hypoglycemia   . Lymphocytosis 06/2012  . Mastodynia   . Murmur    slight  . Nasal congestion   . Osteoporosis   . Osteoporosis   . Prostate cancer ([place])   . Sleep apnea    wears CPAP  . Urinary retention with incomplete bladder emptying    pt uses bathroom and within 30 mins needs to go again  . Vitamin D deficiency    Past Surgical History:  Procedure Laterality Date  . AMPUTATION TOE Right 09/20/2017   Procedure: Right 4th toe amputation;  Surgeon: Wylene Simmer, MD;  Location: [facility];  Service: Orthopedics;  Laterality: Right;  foot  block  . AMPUTATION TOE Right 02/05/2019   Procedure: RIGHT GREAT TOE AMPUTATION;  Surgeon: Leandrew Koyanagi, MD;  Location: [facility];  Service: Orthopedics;  Laterality: Right;  . BACK SURGERY  04/2013  . back surgury     f  . BLEPHAROPLASTY Bilateral 02/23/15  . CATARACT EXTRACTION    . COLONOSCOPY    . COLONOSCOPY N/A 10/02/2017   Procedure: COLONOSCOPY;  Surgeon: Doran Stabler, MD;  Location: [facility];  Service: Gastroenterology;  Laterality: N/A;  . COLONOSCOPY WITH PROPOFOL N/A 10/01/2017   Procedure: COLONOSCOPY WITH PROPOFOL;  Surgeon: Doran Stabler, MD;   Location: [facility];  Service: Gastroenterology;  Laterality: N/A;  . COLONOSCOPY WITH PROPOFOL N/A 03/07/2018   Procedure: COLONOSCOPY WITH PROPOFOL;  Surgeon: [name], MD;  Location: [facility];  Service: Endoscopy;  Laterality: N/A;  . ESOPHAGOGASTRODUODENOSCOPY (EGD) WITH PROPOFOL N/A 10/01/2017   Procedure: ESOPHAGOGASTRODUODENOSCOPY (EGD) WITH PROPOFOL;  Surgeon: Doran Stabler, MD;  [facility];  Service: Gastroenterology;  Laterality: N/A;  . EYE SURGERY Bilateral   . FINGER SURGERY Right    right thumb  . HARDWARE REMOVAL Right 05/15/2016   Procedure: Right Lumbar Two-Lumbar Five Removal of Hardware;  Surgeon: Kristeen Miss, MD;  Location: [facility];  Service: Neurosurgery;  Laterality: Right;  Right L2-5 removal of hardware  . INGUINAL HERNIA REPAIR     x4  . IR RADIOLOGIST EVAL & MGMT  07/10/2018  . IR RADIOLOGIST EVAL & MGMT  09/05/2018  . IR SACROPLASTY BILATERAL  07/15/2018  . REPAIR SPIGELIAN HERNIA  2011  . [place] ,2014  . TONSILLECTOMY    . UPPER GASTROINTESTINAL ENDOSCOPY    . VASECTOMY      Allergies  Allergen Reactions  . Celebrex [Celecoxib] Other (See Comments)    Created stomach ulcers.  Marland Kitchen Flexeril [Cyclobenzaprine] Other (See Comments)    Caught in hiatal hernia and caused extreme pain    Outpatient Encounter Medications as of 02/19/2020  Medication Sig  . acetaminophen (TYLENOL) 500 MG tablet Take 500 mg by mouth 3 (three) times daily.   Marland Kitchen buPROPion (WELLBUTRIN SR) 150 MG 12 hr tablet Take 150 mg by mouth 2 (two) times daily.   . carboxymethylcellul-glycerin (REFRESH REPAIR) 0.5-0.9 % ophthalmic solution Place 1 drop into both eyes 3 (three) times daily.  . cholecalciferol (VITAMIN D) 1000 UNITS tablet Take 1,000 Units by mouth daily.  . diclofenac Sodium (VOLTAREN) 1 % GEL Apply 2 g topically 4 (four) times daily.  Marland Kitchen gabapentin (NEURONTIN) 100 MG capsule Take 100 mg by mouth every morning.  . gabapentin (NEURONTIN)  300 MG capsule Take 300 mg by mouth 2 (two) times daily.   Marland Kitchen HYDROcodone-acetaminophen (NORCO/VICODIN) 5-325 MG tablet Take 1 tablet by mouth every 6 (six) hours as needed for moderate pain.  Marland Kitchen lovastatin (MEVACOR) 20 MG tablet Take 20 mg by mouth at bedtime.   . Multiple Vitamins-Minerals (CENTRUM ADULTS PO) Take 1 tablet by mouth daily.  . Multiple Vitamins-Minerals (PRESERVISION AREDS 2 PO) Take 1 capsule by mouth 2 (two) times daily.  Marland Kitchen nystatin cream (MYCOSTATIN) Apply 1 application topically 2 (two) times daily. Apply to buttocks for redness  . pantoprazole (PROTONIX) 40 MG tablet Take 40 mg by mouth every morning.   . propranolol (INDERAL) 10 MG tablet Take 10 mg by mouth daily.   Marland Kitchen rOPINIRole (REQUIP) 1 MG tablet TAKE 2 TABLETS(2 MG) BY MOUTH AT BEDTIME  . saccharomyces  boulardii (FLORASTOR) 250 MG capsule Take 1 capsule (250 mg total) by mouth 2 (two) times daily.  . vancomycin (VANCOCIN) 125 MG capsule One tablet by mouth four times a day x 14 days One tablet by mouth twice daily x 7 days One tablet by mouth daily x 7 days  One tablet by mouth every other day x 1 month, then stop  . zinc oxide 20 % ointment Apply 1 application topically as needed for irritation.   No facility-administered encounter medications on file as of 02/19/2020.    Review of Systems  Constitutional: Positive for activity change, appetite change and unexpected weight change.  HENT: Negative.   Respiratory: Negative.   Cardiovascular: Negative.   Gastrointestinal: Positive for diarrhea.  Genitourinary: Negative.   Musculoskeletal: Positive for back pain.  Neurological: Positive for weakness.  Psychiatric/Behavioral: Positive for decreased concentration and dysphoric mood.    Immunization History  Administered Date(s) Administered  . Influenza, High Dose Seasonal PF 09/17/2019  . Influenza-Unspecified 09/16/2018  . Moderna SARS-COVID-2 Vaccination 12/08/2019, 01/05/2020  . Pneumococcal Conjugate-13  12/29/2013  . Pneumococcal Polysaccharide-23 09/08/2005  . Tdap 05/25/2005, 08/08/2018  . Zoster Recombinat (Shingrix) 10/17/2017, 01/25/2018   Pertinent  Health Maintenance Due  Topic Date Due  . FOOT EXAM  Never done  . OPHTHALMOLOGY EXAM  Never done  . URINE MICROALBUMIN  Never done  . INFLUENZA VACCINE  Completed  . PNA vac Low Risk Adult  Completed  . HEMOGLOBIN A1C  Discontinued   Fall Risk  01/01/2019 09/24/2018 01/04/2017 10/06/2016 09/29/2016  Falls in the past year? 0 No No No No   Functional Status Survey:    Vitals:   02/19/20 1414  BP: 109/67  Pulse: (!) 59  Resp: 18  Temp: 97.6 F (36.4 C)  TempSrc: Oral  SpO2: 96%  Weight: 157 lb 6.4 oz (71.4 kg)  Height: 5\' 6"  (1.676 m)   Body mass index is 25.41 kg/m. Physical Exam Vitals reviewed.  Constitutional:      Appearance: Normal appearance.  HENT:     Head: Normocephalic.     Nose: Nose normal.     Mouth/Throat:     Mouth: Mucous membranes are moist.     Pharynx: Oropharynx is clear.  Eyes:     Pupils: Pupils are equal, round, and reactive to light.  Cardiovascular:     Rate and Rhythm: Normal rate.     Pulses: Normal pulses.  Pulmonary:     Effort: Pulmonary effort is normal.     Breath sounds: Normal breath sounds.  Abdominal:     General: Abdomen is flat. Bowel sounds are normal.     Palpations: Abdomen is soft.  Musculoskeletal:        General: No swelling.     Cervical back: Neck supple.  Skin:    General: Skin is warm.  Neurological:     General: No focal deficit present.     Mental Status: He is alert and oriented to person, place, and time.  Psychiatric:     Comments: Is depressed     Labs reviewed: Recent Labs    01/15/20 0240 01/16/20 0243 01/17/20 0241  NA 135 135 135  K 3.9 4.2 3.8  CL 111 112* 108  CO2 16* 18* 20*  GLUCOSE 98 126* 100*  BUN 11 11 12   CREATININE 0.74 0.70 0.61  CALCIUM 7.6* 7.6* 7.7*  MG 1.8 1.8 1.7   Recent Labs    01/12/20 2110  AST 38  ALT 18  ALKPHOS 76  BILITOT 0.6  PROT 6.5  ALBUMIN 3.0*   Recent Labs    01/15/20 0240 01/16/20 0243 01/17/20 0241  WBC 18.0* 16.8* 11.6*  NEUTROABS 15.7* 14.4* 9.4*  HGB 11.2* 10.7* 10.9*  HCT 33.9* 33.2* 34.0*  MCV 91.1 93.0 91.6  PLT 256 252 256   Lab Results  Component Value Date   TSH 2.35 07/08/2018   Lab Results  Component Value Date   HGBA1C 5.2 08/29/2018   Lab Results  Component Value Date   CHOL 158 08/29/2018   HDL 43 08/29/2018   LDLCALC 98 08/29/2018   TRIG 76 08/29/2018    Significant Diagnostic Results in last 30 days:  No results found.  Assessment/Plan  C. difficile colitis Pulsed vancomycin therapy for 8 weeks per Dr. Fuller Plan Also patient is going to start on lactose-free diet with no raw vegetables and fruit Continue contact isolation Weight loss Will have dietary evaluate him Consider Remeron if appetite does not get better Weakness Labs done few weeks ago were completely normal Continues to be dependent for his ADLs and transfers Is working with therapy would not be able to discharge to his apartment Neuropathy Symptoms controlled on high-dose of Neurontin Benign prostatic hyperplasia with urinary obstruction Continue with Foley catheter Chronic pain syndrome Pain seems controlled on as needed Norco OSA on CPAP His appointment would be canceled with pulmonary for adjustment of his CPAP  until he is off from the contact isolation Depression On Wellbutrin Consider Remeron appetite does not improve   Family/ staff Communication:   Labs/tests ordered:

## 2020-02-19 NOTE — Telephone Encounter (Signed)
Received CM that DME did receive cpap orders and will process.

## 2020-02-24 ENCOUNTER — Non-Acute Institutional Stay (SKILLED_NURSING_FACILITY): Payer: Medicare Other | Admitting: Nurse Practitioner

## 2020-02-24 ENCOUNTER — Telehealth: Payer: Self-pay | Admitting: Adult Health

## 2020-02-24 ENCOUNTER — Encounter: Payer: Self-pay | Admitting: Nurse Practitioner

## 2020-02-24 DIAGNOSIS — N401 Enlarged prostate with lower urinary tract symptoms: Secondary | ICD-10-CM

## 2020-02-24 DIAGNOSIS — G894 Chronic pain syndrome: Secondary | ICD-10-CM | POA: Diagnosis not present

## 2020-02-24 DIAGNOSIS — G4752 REM sleep behavior disorder: Secondary | ICD-10-CM | POA: Diagnosis not present

## 2020-02-24 DIAGNOSIS — R21 Rash and other nonspecific skin eruption: Secondary | ICD-10-CM | POA: Insufficient documentation

## 2020-02-24 DIAGNOSIS — K219 Gastro-esophageal reflux disease without esophagitis: Secondary | ICD-10-CM | POA: Diagnosis not present

## 2020-02-24 DIAGNOSIS — N138 Other obstructive and reflux uropathy: Secondary | ICD-10-CM | POA: Diagnosis not present

## 2020-02-24 DIAGNOSIS — F339 Major depressive disorder, recurrent, unspecified: Secondary | ICD-10-CM

## 2020-02-24 NOTE — Assessment & Plan Note (Signed)
His mood is stable, continue Wellbutrin.  

## 2020-02-24 NOTE — Assessment & Plan Note (Signed)
Continue Requip. 

## 2020-02-24 NOTE — Assessment & Plan Note (Signed)
Redness in intergluteal cleft, moist and heat are contributory, will assist the patient with incontinent care, apply Nystatin powder bid to reddened area for 2 weeks. Observe

## 2020-02-24 NOTE — Progress Notes (Signed)
Location:   [facility] Room Number: 26 Place of Service:  SNF ([phone]) Provider: Marlana Latus NP  Leanna Battles, MD  Patient Care Team: Leanna Battles, MD as PCP - General (Internal Medicine) [name], MD (Hematology and Oncology) [name], MD as Attending Physician (Urology) [name], MD (Gastroenterology)  Extended Emergency Contact Information Primary Emergency Contact: Cheyene, Stude Mobile Phone: [phone] Relation: Son Preferred language: English Interpreter needed? No Secondary Emergency Contact: Ebeling,Janet H Address: [address]          [address]          [place], [place] [address] [name] of [place] Phone: [phone] Mobile Phone: [phone] Relation: Spouse  Code Status:  DNR Goals of care: Advanced Directive information Advanced Directives 02/24/2020  Does Patient Have a Medical Advance Directive? Yes  Type of Advance Directive Living will;Healthcare Power of Attorney  Does patient want to make changes to medical advance directive? No - Patient declined  Copy of [place] in Chart? Yes - validated most recent copy scanned in chart (See row information)  Would patient like information on creating a medical advance directive? -     Chief Complaint  Patient presents with  . Acute Visit    Rash on buttocks    HPI:  Pt is a 86 y.o. male seen today for an acute visit for reported persisted redness in buttocks, remaining redness is in the intergluteal cleft. Hx of C-diff Colitis, f/u GI, pulsed Vanco, no further diarrhea. BPH, uses Foley. Chronic pain, on Tylenol,  Norco, Gabapentin. His mood is stable, on Wellbutrin. GERD, stable, on Pantoprazole. REM, stable, on  Requip.   Past Medical History:  Diagnosis Date  . Anemia   . Arthritis   . AVM (arteriovenous malformation)   . Barrett's esophagus   . BPH (benign prostatic hyperplasia)   . Cataract   . Constipation   .  CPAP (continuous positive airway pressure) dependence   . Depression   . Diverticulosis    diverticular bleeding  . Diverticulosis   . GERD (gastroesophageal reflux disease)   . History of radiation therapy   . HOH (hard of hearing)    wears hearing aids  . Hyperlipidemia   . Hypoglycemia   . Lymphocytosis 06/2012  . Mastodynia   . Murmur    slight  . Nasal congestion   . Osteoporosis   . Osteoporosis   . Prostate cancer ([place])   . Sleep apnea    wears CPAP  . Urinary retention with incomplete bladder emptying    pt uses bathroom and within 30 mins needs to go again  . Vitamin D deficiency    Past Surgical History:  Procedure Laterality Date  . AMPUTATION TOE Right 09/20/2017   Procedure: Right 4th toe amputation;  Surgeon: Wylene Simmer, MD;  Location: [facility];  Service: Orthopedics;  Laterality: Right;  foot block  . AMPUTATION TOE Right 02/05/2019   Procedure: RIGHT GREAT TOE AMPUTATION;  Surgeon: Leandrew Koyanagi, MD;  Location: [facility];  Service: Orthopedics;  Laterality: Right;  . BACK SURGERY  04/2013  . back surgury     f  . BLEPHAROPLASTY Bilateral 02/23/15  . CATARACT EXTRACTION    . COLONOSCOPY    . COLONOSCOPY N/A 10/02/2017   Procedure: COLONOSCOPY;  Surgeon: Doran Stabler, MD;  Location: [facility];  Service: Gastroenterology;  Laterality: N/A;  . COLONOSCOPY WITH PROPOFOL N/A 10/01/2017  Procedure: COLONOSCOPY WITH PROPOFOL;  Surgeon: Doran Stabler, MD;  [facility];  Service: Gastroenterology;  Laterality: N/A;  . COLONOSCOPY WITH PROPOFOL N/A 03/07/2018   Procedure: COLONOSCOPY WITH PROPOFOL;  Surgeon: [name], MD;  [facility];  Service: Endoscopy;  Laterality: N/A;  . ESOPHAGOGASTRODUODENOSCOPY (EGD) WITH PROPOFOL N/A 10/01/2017   Procedure: ESOPHAGOGASTRODUODENOSCOPY (EGD) WITH PROPOFOL;  Surgeon: Doran Stabler, MD;  [facility];  Service: Gastroenterology;  Laterality:  N/A;  . EYE SURGERY Bilateral   . FINGER SURGERY Right    right thumb  . HARDWARE REMOVAL Right 05/15/2016   Procedure: Right Lumbar Two-Lumbar Five Removal of Hardware;  Surgeon: Kristeen Miss, MD;  Location: [facility];  Service: Neurosurgery;  Laterality: Right;  Right L2-5 removal of hardware  . INGUINAL HERNIA REPAIR     x4  . IR RADIOLOGIST EVAL & MGMT  07/10/2018  . IR RADIOLOGIST EVAL & MGMT  09/05/2018  . IR SACROPLASTY BILATERAL  07/15/2018  . REPAIR SPIGELIAN HERNIA  2011  . [place] ,2014  . TONSILLECTOMY    . UPPER GASTROINTESTINAL ENDOSCOPY    . VASECTOMY      Allergies  Allergen Reactions  . Celebrex [Celecoxib] Other (See Comments)    Created stomach ulcers.  Yvette Rack [Cyclobenzaprine] Other (See Comments)    Caught in hiatal hernia and caused extreme pain    Allergies as of 02/24/2020      Reactions   Celebrex [celecoxib] Other (See Comments)   Created stomach ulcers.   Flexeril [cyclobenzaprine] Other (See Comments)   Caught in hiatal hernia and caused extreme pain      Medication List       Accurate as of February 24, 2020 11:59 PM. If you have any questions, ask your nurse or doctor.        STOP taking these medications   nystatin cream Commonly known as: MYCOSTATIN Stopped by: Dawsyn Zurn X Shayona Hibbitts, NP     TAKE these medications   acetaminophen 500 MG tablet Commonly known as: TYLENOL Take 500 mg by mouth 3 (three) times daily.   buPROPion 150 MG 12 hr tablet Commonly known as: WELLBUTRIN SR Take 150 mg by mouth 2 (two) times daily.   CENTRUM ADULTS PO Take 1 tablet by mouth daily.   PRESERVISION AREDS 2 PO Take 1 capsule by mouth 2 (two) times daily.   cholecalciferol 1000 units tablet Commonly known as: VITAMIN D Take 1,000 Units by mouth daily.   diclofenac Sodium 1 % Gel Commonly known as: VOLTAREN Apply 2 g topically 4 (four) times daily.   gabapentin 300 MG capsule Commonly known as: NEURONTIN Take 300 mg by mouth 2 (two)  times daily.   gabapentin 100 MG capsule Commonly known as: NEURONTIN Take 100 mg by mouth every morning.   HYDROcodone-acetaminophen 5-325 MG tablet Commonly known as: NORCO/VICODIN Take 1 tablet by mouth every 6 (six) hours as needed for moderate pain.   lovastatin 20 MG tablet Commonly known as: MEVACOR Take 20 mg by mouth at bedtime.   pantoprazole 40 MG tablet Commonly known as: PROTONIX Take 40 mg by mouth every morning.   propranolol 10 MG tablet Commonly known as: INDERAL Take 10 mg by mouth daily.   Refresh Repair 0.5-0.9 % ophthalmic solution Generic drug: carboxymethylcellul-glycerin Place 1 drop into both eyes 3 (three) times daily.   rOPINIRole 1 MG tablet Commonly known as: REQUIP TAKE 2 TABLETS(2 MG) BY MOUTH AT BEDTIME  saccharomyces boulardii 250 MG capsule Commonly known as: Florastor Take 1 capsule (250 mg total) by mouth 2 (two) times daily.   vancomycin 125 MG capsule Commonly known as: VANCOCIN One tablet by mouth four times a day x 14 days One tablet by mouth twice daily x 7 days One tablet by mouth daily x 7 days  One tablet by mouth every other day x 1 month, then stop   zinc oxide 20 % ointment Apply 1 application topically as needed for irritation.       Review of Systems  Constitutional: Negative for activity change, appetite change, fatigue and fever.  HENT: Positive for hearing loss. Negative for congestion and voice change.   Eyes: Negative for visual disturbance.  Respiratory: Negative for cough and shortness of breath.   Cardiovascular: Negative for palpitations and leg swelling.  Gastrointestinal: Negative for abdominal distention, abdominal pain and diarrhea.       Fecal incontinence   Genitourinary: Positive for difficulty urinating. Negative for dysuria.       Foley  Musculoskeletal: Positive for arthralgias, back pain, gait problem and neck pain.  Skin: Positive for rash. Negative for color change.  Neurological:  Positive for tremors. Negative for dizziness, speech difficulty and headaches.       Memory lapses.   Psychiatric/Behavioral: Negative for agitation, behavioral problems and sleep disturbance.    Immunization History  Administered Date(s) Administered  . Influenza, High Dose Seasonal PF 09/17/2019  . Influenza-Unspecified 09/16/2018  . Moderna SARS-COVID-2 Vaccination 12/08/2019, 01/05/2020  . Pneumococcal Conjugate-13 12/29/2013  . Pneumococcal Polysaccharide-23 09/08/2005  . Tdap 05/25/2005, 08/08/2018  . Zoster Recombinat (Shingrix) 10/17/2017, 01/25/2018   Pertinent  Health Maintenance Due  Topic Date Due  . FOOT EXAM  Never done  . OPHTHALMOLOGY EXAM  Never done  . URINE MICROALBUMIN  Never done  . INFLUENZA VACCINE  Completed  . PNA vac Low Risk Adult  Completed  . HEMOGLOBIN A1C  Discontinued   Fall Risk  01/01/2019 09/24/2018 01/04/2017 10/06/2016 09/29/2016  Falls in the past year? 0 No No No No   Functional Status Survey:    Vitals:   02/24/20 1352  BP: 132/72  Pulse: 60  Resp: 20  Temp: 97.7 F (36.5 C)  SpO2: 97%  Weight: 157 lb 6.4 oz (71.4 kg)  Height: 5\' 6"  (1.676 m)   Body mass index is 25.41 kg/m. Physical Exam Vitals and nursing note reviewed.  Constitutional:      General: He is not in acute distress.    Appearance: Normal appearance. He is not ill-appearing.  HENT:     Head: Normocephalic and atraumatic.     Mouth/Throat:     Mouth: Mucous membranes are moist.  Eyes:     Extraocular Movements: Extraocular movements intact.     Conjunctiva/sclera: Conjunctivae normal.     Pupils: Pupils are equal, round, and reactive to light.  Cardiovascular:     Rate and Rhythm: Normal rate and regular rhythm.     Heart sounds: No murmur.  Pulmonary:     Effort: Pulmonary effort is normal.     Breath sounds: No wheezing or rales.  Abdominal:     General: Bowel sounds are normal. There is no distension.     Palpations: Abdomen is soft.     Tenderness:  There is no abdominal tenderness. There is no guarding.  Musculoskeletal:     Cervical back: Normal range of motion and neck supple.     Right lower leg: No edema.  Left lower leg: No edema.  Skin:    General: Skin is warm and dry.     Findings: Erythema and rash present.     Comments: Intergluteal cleft   Neurological:     General: No focal deficit present.     Mental Status: He is alert. Mental status is at baseline.     Cranial Nerves: No cranial nerve deficit.     Motor: No weakness.     Gait: Gait abnormal.     Comments: Oriented to person, place.   Psychiatric:        Mood and Affect: Mood normal.        Behavior: Behavior normal.        Thought Content: Thought content normal.        Judgment: Judgment normal.     Labs reviewed: Recent Labs    01/15/20 0240 01/15/20 0240 01/16/20 0243 01/17/20 0241 02/09/20 0000  NA 135   < > 135 135 138  K 3.9   < > 4.2 3.8 4.1  CL 111   < > 112* 108 105  CO2 16*   < > 18* 20* 24*  GLUCOSE 98  --  126* 100*  --   BUN 11   < > 11 12 17   CREATININE 0.74   < > 0.70 0.61 0.7  CALCIUM 7.6*   < > 7.6* 7.7* 8.9  MG 1.8  --  1.8 1.7  --    < > = values in this interval not displayed.   Recent Labs    01/12/20 2110 02/09/20 0000  AST 38 31  ALT 18 10  ALKPHOS 76 98  BILITOT 0.6  --   PROT 6.5  --   ALBUMIN 3.0* 3.4*   Recent Labs    01/15/20 0240 01/15/20 0240 01/16/20 0243 01/17/20 0241 02/09/20 0000  WBC 18.0*   < > 16.8* 11.6* 6.6  NEUTROABS 15.7*   < > 14.4* 9.4* 4,204  HGB 11.2*   < > 10.7* 10.9* 13.0*  HCT 33.9*   < > 33.2* 34.0* 41  MCV 91.1  --  93.0 91.6  --   PLT 256   < > 252 256 272   < > = values in this interval not displayed.   Lab Results  Component Value Date   TSH 2.38 02/09/2020   Lab Results  Component Value Date   HGBA1C 5.2 08/29/2018   Lab Results  Component Value Date   CHOL 158 08/29/2018   HDL 43 08/29/2018   LDLCALC 98 08/29/2018   TRIG 76 08/29/2018    Significant  Diagnostic Results in last 30 days:  No results found.  Assessment/Plan Rash Redness in intergluteal cleft, moist and heat are contributory, will assist the patient with incontinent care, apply Nystatin powder bid to reddened area for 2 weeks. Observe   BPH (benign prostatic hyperplasia) Continue Foley.   Chronic pain syndrome Chronic pain, peripheral neuropathy, continue Tylenol, Norco, Gabapentin.   Depression, recurrent ([place]) His mood is stable, continue Wellbutrin.   GERD Stable, continue Pantoprazole.   REM sleep behavior disorder Continue Requip    Family/ staff Communication: plan of care reviewed with the patient and charge nurse.   Labs/tests ordered: none  Time spend 25 minutes.

## 2020-02-24 NOTE — Assessment & Plan Note (Signed)
Chronic pain, peripheral neuropathy, continue Tylenol, Norco, Gabapentin.

## 2020-02-24 NOTE — Assessment & Plan Note (Signed)
Stable, continue Pantoprazole.  

## 2020-02-24 NOTE — Assessment & Plan Note (Signed)
Continue Foley °

## 2020-03-01 ENCOUNTER — Encounter: Payer: Self-pay | Admitting: Nurse Practitioner

## 2020-03-01 ENCOUNTER — Non-Acute Institutional Stay (SKILLED_NURSING_FACILITY): Payer: Medicare Other | Admitting: Nurse Practitioner

## 2020-03-01 DIAGNOSIS — G629 Polyneuropathy, unspecified: Secondary | ICD-10-CM

## 2020-03-01 DIAGNOSIS — G250 Essential tremor: Secondary | ICD-10-CM | POA: Diagnosis not present

## 2020-03-01 DIAGNOSIS — F339 Major depressive disorder, recurrent, unspecified: Secondary | ICD-10-CM | POA: Diagnosis not present

## 2020-03-01 DIAGNOSIS — A0472 Enterocolitis due to Clostridium difficile, not specified as recurrent: Secondary | ICD-10-CM

## 2020-03-01 DIAGNOSIS — K219 Gastro-esophageal reflux disease without esophagitis: Secondary | ICD-10-CM

## 2020-03-01 DIAGNOSIS — G4752 REM sleep behavior disorder: Secondary | ICD-10-CM | POA: Diagnosis not present

## 2020-03-01 DIAGNOSIS — M79674 Pain in right toe(s): Secondary | ICD-10-CM

## 2020-03-01 NOTE — Assessment & Plan Note (Signed)
Stable, continue Propranolol.  

## 2020-03-01 NOTE — Assessment & Plan Note (Signed)
The right 2nd toe pain, prefers prn Norco, hx of the right 2nd toe ulcer and osteomyelitis, presently a thick callous at the tip of the right 2nd hammer toe, no open wound, colder comparing to the left foot, weak DP pulse, will obtain ABI.

## 2020-03-01 NOTE — Assessment & Plan Note (Signed)
Persisted, diarrhea 3x so far today, denied nausea, vomiting, abd pain, continue Vanco po

## 2020-03-01 NOTE — Progress Notes (Signed)
Location:   [facility] Room Number: 26 Place of Service:  SNF ([phone]) Provider:  Manson Luckadoo, Manxie NP  Leanna Battles, MD  Patient Care Team: Leanna Battles, MD as PCP - General (Internal Medicine) [name], MD (Hematology and Oncology) [name], MD as Attending Physician (Urology) [name], MD (Gastroenterology)  Extended Emergency Contact Information Primary Emergency Contact: Gurjot, Peardon Mobile Phone: [phone] Relation: Son Preferred language: English Interpreter needed? No Secondary Emergency Contact: Hendler,Janet H Address: [address]          [address]          [place], [place] [address] [name] of [place] Phone: [phone] Mobile Phone: [phone] Relation: Spouse  Code Status:  Full Code Goals of care: Advanced Directive information Advanced Directives 03/01/2020  Does Patient Have a Medical Advance Directive? Yes  Type of Advance Directive Living will;Healthcare Power of Attorney  Does patient want to make changes to medical advance directive? No - Patient declined  Copy of [place] in Chart? Yes - validated most recent copy scanned in chart (See row information)  Would patient like information on creating a medical advance directive? -     Chief Complaint  Patient presents with  . Medical Management of Chronic Issues  . Health Maintenance    Foot and eye exam, urine microalbumin    HPI:  Pt is a 86 y.o. male seen today for medical management of chronic diseases.    The patient complains of the right 2nd toe pain, callous noted at the tip of the hammer toe, no open wound, weak DP pulse, colder to touch than the right foot, prn Norco is effective. Hx of peripheral neuropathy, managed on Gabapentin 300mg  bid, 100mg  qd, Tylenol 500mg  tid.  REM, Requip 1mg  qd, essential tremor, stable, on Propranolol. GERD, stable, on Pantoprazole 40mg  qd. Depression, stable, on Wellbutrin  150mg  bid. C-diff Colitis, 3 diarrhea today.    Past Medical History:  Diagnosis Date  . Anemia   . Arthritis   . AVM (arteriovenous malformation)   . Barrett's esophagus   . BPH (benign prostatic hyperplasia)   . Cataract   . Constipation   . CPAP (continuous positive airway pressure) dependence   . Depression   . Diverticulosis    diverticular bleeding  . Diverticulosis   . GERD (gastroesophageal reflux disease)   . History of radiation therapy   . HOH (hard of hearing)    wears hearing aids  . Hyperlipidemia   . Hypoglycemia   . Lymphocytosis 06/2012  . Mastodynia   . Murmur    slight  . Nasal congestion   . Osteoporosis   . Osteoporosis   . Prostate cancer ([place])   . Sleep apnea    wears CPAP  . Urinary retention with incomplete bladder emptying    pt uses bathroom and within 30 mins needs to go again  . Vitamin D deficiency    Past Surgical History:  Procedure Laterality Date  . AMPUTATION TOE Right 09/20/2017   Procedure: Right 4th toe amputation;  Surgeon: Wylene Simmer, MD;  Location: [facility];  Service: Orthopedics;  Laterality: Right;  foot block  . AMPUTATION TOE Right 02/05/2019   Procedure: RIGHT GREAT TOE AMPUTATION;  Surgeon: Leandrew Koyanagi, MD;  Location: [facility];  Service: Orthopedics;  Laterality: Right;  . BACK SURGERY  04/2013  . back surgury     f  . BLEPHAROPLASTY Bilateral  02/23/15  . CATARACT EXTRACTION    . COLONOSCOPY    . COLONOSCOPY N/A 10/02/2017   Procedure: COLONOSCOPY;  Surgeon: Doran Stabler, MD;  Location: [facility];  Service: Gastroenterology;  Laterality: N/A;  . COLONOSCOPY WITH PROPOFOL N/A 10/01/2017   Procedure: COLONOSCOPY WITH PROPOFOL;  Surgeon: Doran Stabler, MD;  Location: [facility];  Service: Gastroenterology;  Laterality: N/A;  . COLONOSCOPY WITH PROPOFOL N/A 03/07/2018   Procedure: COLONOSCOPY WITH PROPOFOL;  Surgeon: [name], MD;  Location: [facility];  Service:  Endoscopy;  Laterality: N/A;  . ESOPHAGOGASTRODUODENOSCOPY (EGD) WITH PROPOFOL N/A 10/01/2017   Procedure: ESOPHAGOGASTRODUODENOSCOPY (EGD) WITH PROPOFOL;  Surgeon: Doran Stabler, MD;  Location: [facility];  Service: Gastroenterology;  Laterality: N/A;  . EYE SURGERY Bilateral   . FINGER SURGERY Right    right thumb  . HARDWARE REMOVAL Right 05/15/2016   Procedure: Right Lumbar Two-Lumbar Five Removal of Hardware;  Surgeon: Kristeen Miss, MD;  Location: [facility];  Service: Neurosurgery;  Laterality: Right;  Right L2-5 removal of hardware  . INGUINAL HERNIA REPAIR     x4  . IR RADIOLOGIST EVAL & MGMT  07/10/2018  . IR RADIOLOGIST EVAL & MGMT  09/05/2018  . IR SACROPLASTY BILATERAL  07/15/2018  . REPAIR SPIGELIAN HERNIA  2011  . [place] ,2014  . TONSILLECTOMY    . UPPER GASTROINTESTINAL ENDOSCOPY    . VASECTOMY      Allergies  Allergen Reactions  . Celebrex [Celecoxib] Other (See Comments)    Created stomach ulcers.  Yvette Rack [Cyclobenzaprine] Other (See Comments)    Caught in hiatal hernia and caused extreme pain    Allergies as of 03/01/2020      Reactions   Celebrex [celecoxib] Other (See Comments)   Created stomach ulcers.   Flexeril [cyclobenzaprine] Other (See Comments)   Caught in hiatal hernia and caused extreme pain      Medication List       Accurate as of March 01, 2020  3:51 PM. If you have any questions, ask your nurse or doctor.        acetaminophen 500 MG tablet Commonly known as: TYLENOL Take 500 mg by mouth 3 (three) times daily.   buPROPion 150 MG 12 hr tablet Commonly known as: WELLBUTRIN SR Take 150 mg by mouth 2 (two) times daily.   CENTRUM ADULTS PO Take 1 tablet by mouth daily.   PRESERVISION AREDS 2 PO Take 1 capsule by mouth 2 (two) times daily.   cholecalciferol 1000 units tablet Commonly known as: VITAMIN D Take 1,000 Units by mouth daily.   diclofenac Sodium 1 % Gel Commonly known as: VOLTAREN Apply 2 g  topically 4 (four) times daily.   gabapentin 300 MG capsule Commonly known as: NEURONTIN Take 300 mg by mouth 2 (two) times daily.   gabapentin 100 MG capsule Commonly known as: NEURONTIN Take 100 mg by mouth every morning.   HYDROcodone-acetaminophen 5-325 MG tablet Commonly known as: NORCO/VICODIN Take 1 tablet by mouth every 6 (six) hours as needed for moderate pain.   lovastatin 20 MG tablet Commonly known as: MEVACOR Take 20 mg by mouth at bedtime.   pantoprazole 40 MG tablet Commonly known as: PROTONIX Take 40 mg by mouth every morning.   propranolol 10 MG tablet Commonly known as: INDERAL Take 10 mg by mouth daily.   Refresh Repair 0.5-0.9 % ophthalmic solution Generic drug: carboxymethylcellul-glycerin Place 1 drop into both eyes 3 (three)  times daily.   rOPINIRole 1 MG tablet Commonly known as: REQUIP TAKE 2 TABLETS(2 MG) BY MOUTH AT BEDTIME   saccharomyces boulardii 250 MG capsule Commonly known as: Florastor Take 1 capsule (250 mg total) by mouth 2 (two) times daily.   vancomycin 125 MG capsule Commonly known as: VANCOCIN One tablet by mouth four times a day x 14 days One tablet by mouth twice daily x 7 days One tablet by mouth daily x 7 days  One tablet by mouth every other day x 1 month, then stop   zinc oxide 20 % ointment Apply 1 application topically as needed for irritation.       Review of Systems  Constitutional: Negative for activity change, appetite change, chills, diaphoresis, fatigue and fever.  HENT: Positive for hearing loss. Negative for congestion and voice change.   Eyes: Negative for visual disturbance.  Respiratory: Negative for cough and shortness of breath.   Cardiovascular: Negative for palpitations and leg swelling.  Gastrointestinal: Positive for diarrhea. Negative for abdominal distention and abdominal pain.       Fecal incontinence   Genitourinary: Positive for difficulty urinating. Negative for dysuria.       Foley    Musculoskeletal: Positive for arthralgias, back pain, gait problem and neck pain.       The right 2nd toe pain  Skin: Negative for color change.  Neurological: Positive for tremors. Negative for dizziness, speech difficulty and headaches.       Memory lapses.   Psychiatric/Behavioral: Negative for agitation, behavioral problems and sleep disturbance.    Immunization History  Administered Date(s) Administered  . Influenza, High Dose Seasonal PF 09/17/2019  . Influenza-Unspecified 09/16/2018  . Moderna SARS-COVID-2 Vaccination 12/08/2019, 01/05/2020  . Pneumococcal Conjugate-13 12/29/2013  . Pneumococcal Polysaccharide-23 09/08/2005  . Tdap 05/25/2005, 08/08/2018  . Zoster Recombinat (Shingrix) 10/17/2017, 01/25/2018   Pertinent  Health Maintenance Due  Topic Date Due  . FOOT EXAM  Never done  . OPHTHALMOLOGY EXAM  Never done  . URINE MICROALBUMIN  Never done  . INFLUENZA VACCINE  Completed  . PNA vac Low Risk Adult  Completed  . HEMOGLOBIN A1C  Discontinued   Fall Risk  01/01/2019 09/24/2018 01/04/2017 10/06/2016 09/29/2016  Falls in the past year? 0 No No No No   Functional Status Survey:    Vitals:   03/01/20 1408  BP: 139/76  Pulse: 68  Resp: (!) 21  Temp: 98 F (36.7 C)  SpO2: 95%  Weight: 157 lb 6.4 oz (71.4 kg)  Height: 5\' 6"  (1.676 m)   Body mass index is 25.41 kg/m. Physical Exam Vitals and nursing note reviewed.  Constitutional:      General: He is not in acute distress.    Appearance: Normal appearance. He is not ill-appearing.  HENT:     Head: Normocephalic and atraumatic.     Nose: Nose normal.     Mouth/Throat:     Mouth: Mucous membranes are moist.  Eyes:     Extraocular Movements: Extraocular movements intact.     Conjunctiva/sclera: Conjunctivae normal.     Pupils: Pupils are equal, round, and reactive to light.  Cardiovascular:     Rate and Rhythm: Normal rate and regular rhythm.     Heart sounds: Murmur present.     Comments: Weak DP pulse  on the right Pulmonary:     Effort: Pulmonary effort is normal.     Breath sounds: No wheezing or rales.  Abdominal:     General: Bowel  sounds are normal. There is no distension.     Palpations: Abdomen is soft.     Tenderness: There is no abdominal tenderness. There is no guarding or rebound.  Musculoskeletal:        General: Tenderness present.     Cervical back: Normal range of motion and neck supple.     Right lower leg: No edema.     Left lower leg: No edema.     Comments: The  thick callous at the tip of the right 2nd hammer toe. S/p the right great and 4th toe amputation.   Skin:    General: Skin is warm and dry.  Neurological:     General: No focal deficit present.     Mental Status: He is alert. Mental status is at baseline.     Cranial Nerves: No cranial nerve deficit.     Motor: No weakness.     Gait: Gait abnormal.     Comments: Oriented to person, place.   Psychiatric:        Mood and Affect: Mood normal.        Behavior: Behavior normal.        Thought Content: Thought content normal.        Judgment: Judgment normal.     Labs reviewed: Recent Labs    01/15/20 0240 01/15/20 0240 01/16/20 0243 01/17/20 0241 02/09/20 0000  NA 135   < > 135 135 138  K 3.9   < > 4.2 3.8 4.1  CL 111   < > 112* 108 105  CO2 16*   < > 18* 20* 24*  GLUCOSE 98  --  126* 100*  --   BUN 11   < > 11 12 17   CREATININE 0.74   < > 0.70 0.61 0.7  CALCIUM 7.6*   < > 7.6* 7.7* 8.9  MG 1.8  --  1.8 1.7  --    < > = values in this interval not displayed.   Recent Labs    01/12/20 2110 02/09/20 0000  AST 38 31  ALT 18 10  ALKPHOS 76 98  BILITOT 0.6  --   PROT 6.5  --   ALBUMIN 3.0* 3.4*   Recent Labs    01/15/20 0240 01/15/20 0240 01/16/20 0243 01/17/20 0241 02/09/20 0000  WBC 18.0*   < > 16.8* 11.6* 6.6  NEUTROABS 15.7*   < > 14.4* 9.4* 4,204  HGB 11.2*   < > 10.7* 10.9* 13.0*  HCT 33.9*   < > 33.2* 34.0* 41  MCV 91.1  --  93.0 91.6  --   PLT 256   < > 252 256 272    < > = values in this interval not displayed.   Lab Results  Component Value Date   TSH 2.38 02/09/2020   Lab Results  Component Value Date   HGBA1C 5.2 08/29/2018   Lab Results  Component Value Date   CHOL 158 08/29/2018   HDL 43 08/29/2018   LDLCALC 98 08/29/2018   TRIG 76 08/29/2018    Significant Diagnostic Results in last 30 days:  No results found.  Assessment/Plan Pain of toe of right foot The right 2nd toe pain, prefers prn Norco, hx of the right 2nd toe ulcer and osteomyelitis, presently a thick callous at the tip of the right 2nd hammer toe, no open wound, colder comparing to the left foot, weak DP pulse, will obtain ABI.   C. difficile diarrhea Persisted, diarrhea 3x so  far today, denied nausea, vomiting, abd pain, continue Vanco po  GERD Stable, continue Pantoprazole.   Neuropathy Managed, continue Gabapentin, Tylenol  Depression, recurrent (HCC) His mood is stable, continue Wellbutrin.   REM sleep behavior disorder Managed, continue Requip  Essential tremor Stable, continue Propranolol.      Family/ staff Communication: plan of care reviewed with the patient and charge nurse.   Labs/tests ordered:  ABI  Time spend 25 minutes.

## 2020-03-01 NOTE — Assessment & Plan Note (Signed)
His mood is stable, continue Wellbutrin.  

## 2020-03-01 NOTE — Assessment & Plan Note (Addendum)
Managed, continue Gabapentin, Tylenol

## 2020-03-01 NOTE — Assessment & Plan Note (Signed)
Stable, continue Pantoprazole.  

## 2020-03-01 NOTE — Assessment & Plan Note (Signed)
Managed, continue Requip

## 2020-03-02 NOTE — Telephone Encounter (Signed)
"  Called to schedule 03/18, 03/23,03/25, no answer lvm. I am voiding this order, just wanted to make you all aware. "  I will email them back asking if they attempted to [facility] pt

## 2020-03-03 DIAGNOSIS — R52 Pain, unspecified: Secondary | ICD-10-CM | POA: Diagnosis not present

## 2020-03-04 DIAGNOSIS — M545 Low back pain: Secondary | ICD-10-CM | POA: Diagnosis not present

## 2020-03-04 DIAGNOSIS — G250 Essential tremor: Secondary | ICD-10-CM | POA: Diagnosis not present

## 2020-03-04 DIAGNOSIS — E785 Hyperlipidemia, unspecified: Secondary | ICD-10-CM | POA: Diagnosis not present

## 2020-03-04 DIAGNOSIS — R197 Diarrhea, unspecified: Secondary | ICD-10-CM | POA: Diagnosis not present

## 2020-03-04 DIAGNOSIS — R1312 Dysphagia, oropharyngeal phase: Secondary | ICD-10-CM | POA: Diagnosis not present

## 2020-03-04 DIAGNOSIS — G4752 REM sleep behavior disorder: Secondary | ICD-10-CM | POA: Diagnosis not present

## 2020-03-04 DIAGNOSIS — F339 Major depressive disorder, recurrent, unspecified: Secondary | ICD-10-CM | POA: Diagnosis not present

## 2020-03-04 DIAGNOSIS — R2689 Other abnormalities of gait and mobility: Secondary | ICD-10-CM | POA: Diagnosis not present

## 2020-03-04 DIAGNOSIS — I10 Essential (primary) hypertension: Secondary | ICD-10-CM | POA: Diagnosis not present

## 2020-03-04 DIAGNOSIS — N401 Enlarged prostate with lower urinary tract symptoms: Secondary | ICD-10-CM | POA: Diagnosis not present

## 2020-03-04 DIAGNOSIS — G8194 Hemiplegia, unspecified affecting left nondominant side: Secondary | ICD-10-CM | POA: Diagnosis not present

## 2020-03-04 DIAGNOSIS — M6281 Muscle weakness (generalized): Secondary | ICD-10-CM | POA: Diagnosis not present

## 2020-03-04 DIAGNOSIS — N138 Other obstructive and reflux uropathy: Secondary | ICD-10-CM | POA: Diagnosis not present

## 2020-03-04 DIAGNOSIS — R41841 Cognitive communication deficit: Secondary | ICD-10-CM | POA: Diagnosis not present

## 2020-03-04 DIAGNOSIS — R52 Pain, unspecified: Secondary | ICD-10-CM | POA: Diagnosis not present

## 2020-03-04 DIAGNOSIS — G629 Polyneuropathy, unspecified: Secondary | ICD-10-CM | POA: Diagnosis not present

## 2020-03-04 DIAGNOSIS — K5731 Diverticulosis of large intestine without perforation or abscess with bleeding: Secondary | ICD-10-CM | POA: Diagnosis not present

## 2020-03-04 DIAGNOSIS — R634 Abnormal weight loss: Secondary | ICD-10-CM | POA: Diagnosis not present

## 2020-03-04 DIAGNOSIS — A0472 Enterocolitis due to Clostridium difficile, not specified as recurrent: Secondary | ICD-10-CM | POA: Diagnosis not present

## 2020-03-04 DIAGNOSIS — I744 Embolism and thrombosis of arteries of extremities, unspecified: Secondary | ICD-10-CM | POA: Diagnosis not present

## 2020-03-04 DIAGNOSIS — G8929 Other chronic pain: Secondary | ICD-10-CM | POA: Diagnosis not present

## 2020-03-04 DIAGNOSIS — N3000 Acute cystitis without hematuria: Secondary | ICD-10-CM | POA: Diagnosis not present

## 2020-03-04 DIAGNOSIS — N390 Urinary tract infection, site not specified: Secondary | ICD-10-CM | POA: Diagnosis not present

## 2020-03-04 DIAGNOSIS — K219 Gastro-esophageal reflux disease without esophagitis: Secondary | ICD-10-CM | POA: Diagnosis not present

## 2020-03-04 DIAGNOSIS — Z20828 Contact with and (suspected) exposure to other viral communicable diseases: Secondary | ICD-10-CM | POA: Diagnosis not present

## 2020-03-04 DIAGNOSIS — M5442 Lumbago with sciatica, left side: Secondary | ICD-10-CM | POA: Diagnosis not present

## 2020-03-04 DIAGNOSIS — R531 Weakness: Secondary | ICD-10-CM | POA: Diagnosis not present

## 2020-03-04 DIAGNOSIS — I69354 Hemiplegia and hemiparesis following cerebral infarction affecting left non-dominant side: Secondary | ICD-10-CM | POA: Diagnosis not present

## 2020-03-04 DIAGNOSIS — M5441 Lumbago with sciatica, right side: Secondary | ICD-10-CM | POA: Diagnosis not present

## 2020-03-09 NOTE — Telephone Encounter (Signed)
[facility] did reach out to attempt to schedule the patient through [name] through [facility] and was told  "Just called that number and spoke with [name], she advised that the patient is in isolation and they would reach out to me when he is out, she took down my name and number."  So [name] stated that she would contact [facility] once patient is out of isolation

## 2020-03-16 ENCOUNTER — Encounter: Payer: Self-pay | Admitting: Nurse Practitioner

## 2020-03-16 ENCOUNTER — Non-Acute Institutional Stay (SKILLED_NURSING_FACILITY): Payer: Medicare Other | Admitting: Nurse Practitioner

## 2020-03-16 DIAGNOSIS — F339 Major depressive disorder, recurrent, unspecified: Secondary | ICD-10-CM | POA: Diagnosis not present

## 2020-03-16 DIAGNOSIS — K219 Gastro-esophageal reflux disease without esophagitis: Secondary | ICD-10-CM | POA: Diagnosis not present

## 2020-03-16 DIAGNOSIS — M5442 Lumbago with sciatica, left side: Secondary | ICD-10-CM | POA: Diagnosis not present

## 2020-03-16 DIAGNOSIS — N138 Other obstructive and reflux uropathy: Secondary | ICD-10-CM

## 2020-03-16 DIAGNOSIS — I10 Essential (primary) hypertension: Secondary | ICD-10-CM | POA: Diagnosis not present

## 2020-03-16 DIAGNOSIS — A0472 Enterocolitis due to Clostridium difficile, not specified as recurrent: Secondary | ICD-10-CM

## 2020-03-16 DIAGNOSIS — R634 Abnormal weight loss: Secondary | ICD-10-CM | POA: Diagnosis not present

## 2020-03-16 DIAGNOSIS — G4752 REM sleep behavior disorder: Secondary | ICD-10-CM

## 2020-03-16 DIAGNOSIS — G629 Polyneuropathy, unspecified: Secondary | ICD-10-CM

## 2020-03-16 DIAGNOSIS — N401 Enlarged prostate with lower urinary tract symptoms: Secondary | ICD-10-CM | POA: Diagnosis not present

## 2020-03-16 DIAGNOSIS — G250 Essential tremor: Secondary | ICD-10-CM

## 2020-03-16 DIAGNOSIS — M5441 Lumbago with sciatica, right side: Secondary | ICD-10-CM

## 2020-03-16 DIAGNOSIS — G8929 Other chronic pain: Secondary | ICD-10-CM

## 2020-03-16 NOTE — Assessment & Plan Note (Signed)
Managed, continue Requip.

## 2020-03-16 NOTE — Assessment & Plan Note (Signed)
In toes, continue Gabapentin, Tylenol.

## 2020-03-16 NOTE — Progress Notes (Signed)
Location:   [facility] Room Number: 26 Place of Service:  SNF [phone]) Provider:  Nina Mondor, Lennie Odor NP  Leanna Battles, MD  Patient Care Team: Leanna Battles, MD as PCP - General (Internal Medicine) [name], MD (Hematology and Oncology) [name], MD as Attending Physician (Urology) [name], MD (Gastroenterology)  Extended Emergency Contact Information Primary Emergency Contact: Brysun, Dylla Mobile Phone: [phone] Relation: Son Preferred language: English Interpreter needed? No Secondary Emergency Contact: Nicoletti,Janet H Address: [address]          [address]          [place], [place] [address] [name] of [place] Phone: [phone] Mobile Phone: [phone] Relation: Spouse  Code Status:  Full Code Goals of care: Advanced Directive information Advanced Directives 03/16/2020  Does Patient Have a Medical Advance Directive? Yes  Type of Paramedic of [place];Living will  Does patient want to make changes to medical advance directive? No - Patient declined  Copy of [place] in Chart? Yes - validated most recent copy scanned in chart (See row information)  Would patient like information on creating a medical advance directive? -     Chief Complaint  Patient presents with  . Medical Management of Chronic Issues  . Health Maintenance    Eye and foot exam, urine microalbumin    HPI:  Pt is a 86 y.o. male seen today for medical management of chronic diseases.    The patient resides in SNF [facility] for safety, care assistance, C-diff colitis, diarrhea 3 today, no abd pain, afebriel, on Vancomycin 125mg  qd. RLS, stable, on Requip 2mg  qd. Essential tremor, stable, on Propranolol 10mg  qd. GERD, stable, on Pantoprazole 40mg  qd. Neuropathic pain in BLE, stable on Gabapentin 300mg  bid, 100mg  qd, Tylenol 500mg  tid. His mood is stable, on Wellbutrin 150mg  bid.    Past Medical  History:  Diagnosis Date  . Anemia   . Arthritis   . AVM (arteriovenous malformation)   . Barrett's esophagus   . BPH (benign prostatic hyperplasia)   . Cataract   . Constipation   . CPAP (continuous positive airway pressure) dependence   . Depression   . Diverticulosis    diverticular bleeding  . Diverticulosis   . GERD (gastroesophageal reflux disease)   . History of radiation therapy   . HOH (hard of hearing)    wears hearing aids  . Hyperlipidemia   . Hypoglycemia   . Lymphocytosis 06/2012  . Mastodynia   . Murmur    slight  . Nasal congestion   . Osteoporosis   . Osteoporosis   . Prostate cancer ([place])   . Sleep apnea    wears CPAP  . Urinary retention with incomplete bladder emptying    pt uses bathroom and within 30 mins needs to go again  . Vitamin D deficiency    Past Surgical History:  Procedure Laterality Date  . AMPUTATION TOE Right 09/20/2017   Procedure: Right 4th toe amputation;  Surgeon: Wylene Simmer, MD;  Location: [place];  Service: Orthopedics;  Laterality: Right;  foot block  . AMPUTATION TOE Right 02/05/2019   Procedure: RIGHT GREAT TOE AMPUTATION;  Surgeon: Leandrew Koyanagi, MD;  Location: [place];  Service: Orthopedics;  Laterality: Right;  . BACK SURGERY  04/2013  . back surgury     f  . BLEPHAROPLASTY Bilateral 02/23/15  . CATARACT EXTRACTION    . COLONOSCOPY    .  COLONOSCOPY N/A 10/02/2017   Procedure: COLONOSCOPY;  Surgeon: Doran Stabler, MD;  Location: [facility];  Service: Gastroenterology;  Laterality: N/A;  . COLONOSCOPY WITH PROPOFOL N/A 10/01/2017   Procedure: COLONOSCOPY WITH PROPOFOL;  Surgeon: Doran Stabler, MD;  Location: [facility];  Service: Gastroenterology;  Laterality: N/A;  . COLONOSCOPY WITH PROPOFOL N/A 03/07/2018   Procedure: COLONOSCOPY WITH PROPOFOL;  Surgeon: [name], MD;  [facility]: [facility];  Service: Endoscopy;  Laterality: N/A;  . ESOPHAGOGASTRODUODENOSCOPY  (EGD) WITH PROPOFOL N/A 10/01/2017   Procedure: ESOPHAGOGASTRODUODENOSCOPY (EGD) WITH PROPOFOL;  Surgeon: Doran Stabler, MD;  [facility];  Service: Gastroenterology;  Laterality: N/A;  . EYE SURGERY Bilateral   . FINGER SURGERY Right    right thumb  . HARDWARE REMOVAL Right 05/15/2016   Procedure: Right Lumbar Two-Lumbar Five Removal of Hardware;  Surgeon: Kristeen Miss, MD;  Location: [facility];  Service: Neurosurgery;  Laterality: Right;  Right L2-5 removal of hardware  . INGUINAL HERNIA REPAIR     x4  . IR RADIOLOGIST EVAL & MGMT  07/10/2018  . IR RADIOLOGIST EVAL & MGMT  09/05/2018  . IR SACROPLASTY BILATERAL  07/15/2018  . REPAIR SPIGELIAN HERNIA  2011  . [place] ,2014  . TONSILLECTOMY    . UPPER GASTROINTESTINAL ENDOSCOPY    . VASECTOMY      Allergies  Allergen Reactions  . Celebrex [Celecoxib] Other (See Comments)    Created stomach ulcers.  Yvette Rack [Cyclobenzaprine] Other (See Comments)    Caught in hiatal hernia and caused extreme pain    Allergies as of 03/16/2020      Reactions   Celebrex [celecoxib] Other (See Comments)   Created stomach ulcers.   Flexeril [cyclobenzaprine] Other (See Comments)   Caught in hiatal hernia and caused extreme pain      Medication List       Accurate as of March 16, 2020 11:59 PM. If you have any questions, ask your nurse or doctor.        acetaminophen 500 MG tablet Commonly known as: TYLENOL Take 500 mg by mouth 3 (three) times daily.   buPROPion 150 MG 12 hr tablet Commonly known as: WELLBUTRIN SR Take 150 mg by mouth 2 (two) times daily.   CENTRUM ADULTS PO Take 1 tablet by mouth daily.   PRESERVISION AREDS 2 PO Take 1 capsule by mouth 2 (two) times daily.   cholecalciferol 1000 units tablet Commonly known as: VITAMIN D Take 1,000 Units by mouth daily.   diclofenac Sodium 1 % Gel Commonly known as: VOLTAREN Apply 2 g topically 4 (four) times daily.   gabapentin 300 MG capsule  Commonly known as: NEURONTIN Take 300 mg by mouth 2 (two) times daily.   gabapentin 100 MG capsule Commonly known as: NEURONTIN Take 100 mg by mouth every morning.   HYDROcodone-acetaminophen 5-325 MG tablet Commonly known as: NORCO/VICODIN Take 1 tablet by mouth every 6 (six) hours as needed for moderate pain.   lovastatin 20 MG tablet Commonly known as: MEVACOR Take 20 mg by mouth at bedtime.   pantoprazole 40 MG tablet Commonly known as: PROTONIX Take 40 mg by mouth every morning.   propranolol 10 MG tablet Commonly known as: INDERAL Take 10 mg by mouth daily.   Refresh Repair 0.5-0.9 % ophthalmic solution Generic drug: carboxymethylcellul-glycerin Place 1 drop into both eyes 3 (three) times daily.   rOPINIRole 1 MG tablet Commonly known as: REQUIP TAKE 2 TABLETS(2  MG) BY MOUTH AT BEDTIME   saccharomyces boulardii 250 MG capsule Commonly known as: Florastor Take 1 capsule (250 mg total) by mouth 2 (two) times daily.   vancomycin 125 MG capsule Commonly known as: VANCOCIN One tablet by mouth four times a day x 14 days One tablet by mouth twice daily x 7 days One tablet by mouth daily x 7 days  One tablet by mouth every other day x 1 month, then stop   zinc oxide 20 % ointment Apply 1 application topically as needed for irritation.       Review of Systems  Constitutional: Positive for unexpected weight change. Negative for activity change, appetite change, chills, diaphoresis, fatigue and fever.       #%Ibs weight loss in 2 weeks.   HENT: Positive for hearing loss. Negative for congestion and voice change.   Eyes: Negative for visual disturbance.  Respiratory: Negative for cough and shortness of breath.   Cardiovascular: Negative for palpitations and leg swelling.  Gastrointestinal: Positive for diarrhea. Negative for abdominal distention and abdominal pain.       Fecal incontinence, persisted diarrhea 3x today  Genitourinary: Positive for difficulty  urinating. Negative for dysuria.       Foley  Musculoskeletal: Positive for arthralgias, back pain, gait problem and neck pain.       The right 2nd toe pain  Skin: Negative for color change.  Neurological: Positive for tremors. Negative for speech difficulty, light-headedness and headaches.       Memory lapses.   Psychiatric/Behavioral: Negative for agitation, behavioral problems and sleep disturbance.    Immunization History  Administered Date(s) Administered  . Influenza, High Dose Seasonal PF 09/17/2019  . Influenza-Unspecified 09/16/2018  . Moderna SARS-COVID-2 Vaccination 12/08/2019, 01/05/2020  . Pneumococcal Conjugate-13 12/29/2013  . Pneumococcal Polysaccharide-23 09/08/2005  . Tdap 05/25/2005, 08/08/2018  . Zoster Recombinat (Shingrix) 10/17/2017, 01/25/2018   Pertinent  Health Maintenance Due  Topic Date Due  . FOOT EXAM  Never done  . OPHTHALMOLOGY EXAM  Never done  . URINE MICROALBUMIN  Never done  . INFLUENZA VACCINE  07/04/2020  . PNA vac Low Risk Adult  Completed  . HEMOGLOBIN A1C  Discontinued   Fall Risk  01/01/2019 09/24/2018 01/04/2017 10/06/2016 09/29/2016  Falls in the past year? 0 No No No No   Functional Status Survey:    Vitals:   03/16/20 1153  BP: 139/65  Pulse: 72  Resp: (!) 22  Temp: (!) 97.4 F (36.3 C)  SpO2: 97%  Weight: 153 lb 6.4 oz (69.6 kg)  Height: 5\' 6"  (1.676 m)   Body mass index is 24.76 kg/m. Physical Exam Vitals and nursing note reviewed.  Constitutional:      Appearance: Normal appearance.  HENT:     Head: Normocephalic and atraumatic.     Mouth/Throat:     Mouth: Mucous membranes are moist.  Eyes:     Extraocular Movements: Extraocular movements intact.     Conjunctiva/sclera: Conjunctivae normal.     Pupils: Pupils are equal, round, and reactive to light.  Cardiovascular:     Rate and Rhythm: Normal rate and regular rhythm.     Heart sounds: Murmur present.     Comments: Weak DP pulse on the right Pulmonary:      Effort: Pulmonary effort is normal.     Breath sounds: No rales.  Abdominal:     General: Bowel sounds are normal. There is no distension.     Palpations: Abdomen is soft.  Tenderness: There is no abdominal tenderness. There is no guarding or rebound.  Musculoskeletal:        General: Tenderness present.     Cervical back: Normal range of motion and neck supple.     Right lower leg: No edema.     Left lower leg: No edema.     Comments: The  thick callous at the tip of the right 2nd hammer toe. S/p the right great and 4th toe amputation.   Skin:    General: Skin is warm and dry.  Neurological:     General: No focal deficit present.     Mental Status: He is alert. Mental status is at baseline.     Gait: Gait abnormal.     Comments: Oriented to person, place.   Psychiatric:        Mood and Affect: Mood normal.        Behavior: Behavior normal.        Thought Content: Thought content normal.        Judgment: Judgment normal.     Labs reviewed: Recent Labs    01/15/20 0240 01/15/20 0240 01/16/20 0243 01/17/20 0241 02/09/20 0000  NA 135   < > 135 135 138  K 3.9   < > 4.2 3.8 4.1  CL 111   < > 112* 108 105  CO2 16*   < > 18* 20* 24*  GLUCOSE 98  --  126* 100*  --   BUN 11   < > 11 12 17   CREATININE 0.74   < > 0.70 0.61 0.7  CALCIUM 7.6*   < > 7.6* 7.7* 8.9  MG 1.8  --  1.8 1.7  --    < > = values in this interval not displayed.   Recent Labs    01/12/20 2110 02/09/20 0000  AST 38 31  ALT 18 10  ALKPHOS 76 98  BILITOT 0.6  --   PROT 6.5  --   ALBUMIN 3.0* 3.4*   Recent Labs    01/15/20 0240 01/15/20 0240 01/16/20 0243 01/17/20 0241 02/09/20 0000  WBC 18.0*   < > 16.8* 11.6* 6.6  NEUTROABS 15.7*   < > 14.4* 9.4* 4,204  HGB 11.2*   < > 10.7* 10.9* 13.0*  HCT 33.9*   < > 33.2* 34.0* 41  MCV 91.1  --  93.0 91.6  --   PLT 256   < > 252 256 272   < > = values in this interval not displayed.   Lab Results  Component Value Date   TSH 2.38 02/09/2020    Lab Results  Component Value Date   HGBA1C 5.2 08/29/2018   Lab Results  Component Value Date   CHOL 158 08/29/2018   HDL 43 08/29/2018   LDLCALC 98 08/29/2018   TRIG 76 08/29/2018    Significant Diagnostic Results in last 30 days:  No results found.  Assessment/Plan Essential hypertension Blood pressure is controlled.   C. difficile diarrhea Persisted, diarrhea 3x today, continue Vanco  GERD Stable, continue Pantoprazole.   Chronic bilateral low back pain with bilateral sciatica Stable, continue Tylenol, Gabapentin.   Essential tremor Stable, continue Propranolol.   Neuropathy In toes, continue Gabapentin, Tylenol.   BPH (benign prostatic hyperplasia) Urinary retention, continue Foley.   Depression, recurrent ([place]) His mood is stable, continue Wellbutrin.   REM sleep behavior disorder Managed, continue Requip.   Weight loss #5Ibs in 2 weeks, may consider Mirtazapine if no bette.r  Family/ staff Communication: plan of care reviewed with the patient and charge nurse.   Labs/tests ordered:  none  Time spend 25 minutes.

## 2020-03-16 NOTE — Assessment & Plan Note (Signed)
Stable, continue Pantoprazole.  

## 2020-03-16 NOTE — Assessment & Plan Note (Signed)
Stable, continue Tylenol, Gabapentin.

## 2020-03-16 NOTE — Assessment & Plan Note (Signed)
Stable, continue Propranolol.  

## 2020-03-16 NOTE — Assessment & Plan Note (Signed)
#  5Ibs in 2 weeks, may consider Mirtazapine if no bette.r

## 2020-03-16 NOTE — Assessment & Plan Note (Signed)
His mood is stable, continue Wellbutrin.  

## 2020-03-16 NOTE — Assessment & Plan Note (Signed)
Persisted, diarrhea 3x today, continue Vanco

## 2020-03-16 NOTE — Assessment & Plan Note (Signed)
Blood pressure is controlled

## 2020-03-16 NOTE — Assessment & Plan Note (Signed)
Urinary retention, continue Foley.  

## 2020-03-17 ENCOUNTER — Encounter: Payer: Self-pay | Admitting: Nurse Practitioner

## 2020-03-18 ENCOUNTER — Encounter: Payer: Self-pay | Admitting: Internal Medicine

## 2020-03-18 ENCOUNTER — Non-Acute Institutional Stay (SKILLED_NURSING_FACILITY): Payer: Medicare Other | Admitting: Internal Medicine

## 2020-03-18 DIAGNOSIS — I10 Essential (primary) hypertension: Secondary | ICD-10-CM | POA: Diagnosis not present

## 2020-03-18 DIAGNOSIS — N138 Other obstructive and reflux uropathy: Secondary | ICD-10-CM | POA: Diagnosis not present

## 2020-03-18 DIAGNOSIS — M5441 Lumbago with sciatica, right side: Secondary | ICD-10-CM | POA: Diagnosis not present

## 2020-03-18 DIAGNOSIS — N401 Enlarged prostate with lower urinary tract symptoms: Secondary | ICD-10-CM

## 2020-03-18 DIAGNOSIS — A0472 Enterocolitis due to Clostridium difficile, not specified as recurrent: Secondary | ICD-10-CM | POA: Diagnosis not present

## 2020-03-18 DIAGNOSIS — G250 Essential tremor: Secondary | ICD-10-CM

## 2020-03-18 DIAGNOSIS — G8929 Other chronic pain: Secondary | ICD-10-CM

## 2020-03-18 DIAGNOSIS — M5442 Lumbago with sciatica, left side: Secondary | ICD-10-CM

## 2020-03-18 DIAGNOSIS — G629 Polyneuropathy, unspecified: Secondary | ICD-10-CM

## 2020-03-18 NOTE — Progress Notes (Signed)
Location: [facility] Room Number: 26 Place of Service:  SNF ([id number])  Provider:   Code Status:  Goals of Care:  Advanced Directives 03/18/2020  Does Patient Have a Medical Advance Directive? Yes  Type of Paramedic of [place];Living will  Does patient want to make changes to medical advance directive? No - Patient declined  Copy of [place] in Chart? Yes - validated most recent copy scanned in chart (See row information)  Would patient like information on creating a medical advance directive? -     Chief Complaint  Patient presents with  . Acute Visit    Urinary Symptoms     HPI: Patient is a 86 y.o. male seen today for an acute visit for complaining bladder spasm and pain.  He also wanted to talk about contact isolation.  Was admitted in the hospital from 02/8 -02/13 for C Diff Colitis and Weakness  Patient has number of medical prblems including Chronic Diarrhea, H/O AVM, Barretts Esphagus, OSA on CPAP, Hypertension, h/o Prostate cancer wih Chronic Foley Cathter, Chronic Pain due to Kyphosis andNeuropathy  C. difficile colitis Was treated with 10 days of oral Vanco.  Was seen by Dr. Fuller Plan who started him on pulse vancomycin for 8 weeks.  Patient has been in contact isolation for 4 weeks now He continues to have few loose stools.  But much better than before. Weight loss and poor appetite Has lost 5 more pounds with total of 15 pounds.  Patient states that he does have good appetite he just does not like the food here Bladder spasm Patient has chronic Foley catheter.  He was complaining to the nurses that h he is having bladder spasms and his catheter is probably blocked.  It was changed yesterday by the nurses.  He states that since then it is better. Weakness Is doing better with his transfers but still dependent for his ADLs Past Medical History:  Diagnosis Date  . Anemia   . Arthritis   . AVM  (arteriovenous malformation)   . Barrett's esophagus   . BPH (benign prostatic hyperplasia)   . Cataract   . Constipation   . CPAP (continuous positive airway pressure) dependence   . Depression   . Diverticulosis    diverticular bleeding  . Diverticulosis   . GERD (gastroesophageal reflux disease)   . History of radiation therapy   . HOH (hard of hearing)    wears hearing aids  . Hyperlipidemia   . Hypoglycemia   . Lymphocytosis 06/2012  . Mastodynia   . Murmur    slight  . Nasal congestion   . Osteoporosis   . Osteoporosis   . Prostate cancer ([place])   . Sleep apnea    wears CPAP  . Urinary retention with incomplete bladder emptying    pt uses bathroom and within 30 mins needs to go again  . Vitamin D deficiency     Past Surgical History:  Procedure Laterality Date  . AMPUTATION TOE Right 09/20/2017   Procedure: Right 4th toe amputation;  Surgeon: Wylene Simmer, MD;  Location: [facility];  Service: Orthopedics;  Laterality: Right;  foot block  . AMPUTATION TOE Right 02/05/2019   Procedure: RIGHT GREAT TOE AMPUTATION;  Surgeon: Leandrew Koyanagi, MD;  Location: [facility];  Service: Orthopedics;  Laterality: Right;  . BACK SURGERY  04/2013  . back surgury     f  . BLEPHAROPLASTY Bilateral 02/23/15  .  CATARACT EXTRACTION    . COLONOSCOPY    . COLONOSCOPY N/A 10/02/2017   Procedure: COLONOSCOPY;  Surgeon: Doran Stabler, MD;  Location: [facility];  Service: Gastroenterology;  Laterality: N/A;  . COLONOSCOPY WITH PROPOFOL N/A 10/01/2017   Procedure: COLONOSCOPY WITH PROPOFOL;  Surgeon: Doran Stabler, MD;  [facility];  Service: Gastroenterology;  Laterality: N/A;  . COLONOSCOPY WITH PROPOFOL N/A 03/07/2018   Procedure: COLONOSCOPY WITH PROPOFOL;  Surgeon: [name], MD;  [facility];  Service: Endoscopy;  Laterality: N/A;  . ESOPHAGOGASTRODUODENOSCOPY (EGD) WITH PROPOFOL N/A 10/01/2017   Procedure:  ESOPHAGOGASTRODUODENOSCOPY (EGD) WITH PROPOFOL;  Surgeon: Doran Stabler, MD;  [facility];  Service: Gastroenterology;  Laterality: N/A;  . EYE SURGERY Bilateral   . FINGER SURGERY Right    right thumb  . HARDWARE REMOVAL Right 05/15/2016   Procedure: Right Lumbar Two-Lumbar Five Removal of Hardware;  Surgeon: Kristeen Miss, MD;  Location: [facility];  Service: Neurosurgery;  Laterality: Right;  Right L2-5 removal of hardware  . INGUINAL HERNIA REPAIR     x4  . IR RADIOLOGIST EVAL & MGMT  07/10/2018  . IR RADIOLOGIST EVAL & MGMT  09/05/2018  . IR SACROPLASTY BILATERAL  07/15/2018  . REPAIR SPIGELIAN HERNIA  2011  . [place] ,2014  . TONSILLECTOMY    . UPPER GASTROINTESTINAL ENDOSCOPY    . VASECTOMY      Allergies  Allergen Reactions  . Celebrex [Celecoxib] Other (See Comments)    Created stomach ulcers.  Marland Kitchen Flexeril [Cyclobenzaprine] Other (See Comments)    Caught in hiatal hernia and caused extreme pain    Outpatient Encounter Medications as of 03/18/2020  Medication Sig  . acetaminophen (TYLENOL) 500 MG tablet Take 500 mg by mouth 3 (three) times daily.   Marland Kitchen buPROPion (WELLBUTRIN SR) 150 MG 12 hr tablet Take 150 mg by mouth 2 (two) times daily.   . carboxymethylcellul-glycerin (REFRESH REPAIR) 0.5-0.9 % ophthalmic solution Place 1 drop into both eyes 3 (three) times daily.  . cholecalciferol (VITAMIN D) 1000 UNITS tablet Take 1,000 Units by mouth daily.  . diclofenac Sodium (VOLTAREN) 1 % GEL Apply 2 g topically 4 (four) times daily.  Marland Kitchen gabapentin (NEURONTIN) 100 MG capsule Take 100 mg by mouth every morning.  . gabapentin (NEURONTIN) 300 MG capsule Take 300 mg by mouth 2 (two) times daily.   Marland Kitchen HYDROcodone-acetaminophen (NORCO/VICODIN) 5-325 MG tablet Take 1 tablet by mouth every 6 (six) hours as needed for moderate pain.  Marland Kitchen lovastatin (MEVACOR) 20 MG tablet Take 20 mg by mouth at bedtime.   . Multiple Vitamins-Minerals (CENTRUM ADULTS PO) Take 1 tablet by  mouth daily.  . Multiple Vitamins-Minerals (PRESERVISION AREDS 2 PO) Take 1 capsule by mouth 2 (two) times daily.  . pantoprazole (PROTONIX) 40 MG tablet Take 40 mg by mouth every morning.   . propranolol (INDERAL) 10 MG tablet Take 10 mg by mouth daily.   Marland Kitchen rOPINIRole (REQUIP) 1 MG tablet TAKE 2 TABLETS(2 MG) BY MOUTH AT BEDTIME  . saccharomyces boulardii (FLORASTOR) 250 MG capsule Take 1 capsule (250 mg total) by mouth 2 (two) times daily.  . vancomycin (VANCOCIN) 125 MG capsule Take 125 mg by mouth every other day. At 9am.  . zinc oxide 20 % ointment Apply 1 application topically as needed for irritation.  . [DISCONTINUED] vancomycin (VANCOCIN) 125 MG capsule One tablet by mouth four times a day x 14 days One tablet by mouth  twice daily x 7 days One tablet by mouth daily x 7 days  One tablet by mouth every other day x 1 month, then stop   No facility-administered encounter medications on file as of 03/18/2020.    Review of Systems:  Review of Systems  Health Maintenance  Topic Date Due  . FOOT EXAM  Never done  . OPHTHALMOLOGY EXAM  Never done  . URINE MICROALBUMIN  Never done  . INFLUENZA VACCINE  07/04/2020  . TETANUS/TDAP  08/08/2028  . PNA vac Low Risk Adult  Completed  . HEMOGLOBIN A1C  Discontinued    Physical Exam: Vitals:   03/18/20 1334  BP: 139/75  Pulse: (!) 54  Resp: 20  Temp: 97.8 F (36.6 C)  SpO2: 97%  Weight: 144 lb (65.3 kg)  Height: 5\' 6"  (1.676 m)   Body mass index is 23.24 kg/m. Physical Exam  Constitutional: Oriented to person, place, and time. Well-developed and well-nourished.  HENT:  Head: Normocephalic.  Mouth/Throat: Oropharynx is clear and moist.  Eyes: Pupils are equal, round, and reactive to light.  Neck: Neck supple.  Cardiovascular: Normal rate and normal heart sounds.  No murmur heard. Pulmonary/Chest: Effort normal and breath sounds normal. No respiratory distress. No wheezes. She has no rales.  Abdominal: Soft. Bowel sounds are  normal. No distension. There is no tenderness. There is no rebound.  Musculoskeletal: No edema.  Lymphadenopathy: none Neurological: Alert and oriented to person, place, and time.  Skin: Skin is warm and dry.  Psychiatric: Normal mood and affect. Behavior is normal. Thought content normal.    Labs reviewed: Basic Metabolic Panel: Recent Labs    01/15/20 0240 01/15/20 0240 01/16/20 0243 01/17/20 0241 02/09/20 0000  NA 135   < > 135 135 138  K 3.9   < > 4.2 3.8 4.1  CL 111   < > 112* 108 105  CO2 16*   < > 18* 20* 24*  GLUCOSE 98  --  126* 100*  --   BUN 11   < > 11 12 17   CREATININE 0.74   < > 0.70 0.61 0.7  CALCIUM 7.6*   < > 7.6* 7.7* 8.9  MG 1.8  --  1.8 1.7  --   TSH  --   --   --   --  2.38   < > = values in this interval not displayed.   Liver Function Tests: Recent Labs    01/12/20 2110 02/09/20 0000  AST 38 31  ALT 18 10  ALKPHOS 76 98  BILITOT 0.6  --   PROT 6.5  --   ALBUMIN 3.0* 3.4*   Recent Labs    01/12/20 2110  LIPASE 15   No results for input(s): AMMONIA in the last 8760 hours. CBC: Recent Labs    01/15/20 0240 01/15/20 0240 01/16/20 0243 01/17/20 0241 02/09/20 0000  WBC 18.0*   < > 16.8* 11.6* 6.6  NEUTROABS 15.7*   < > 14.4* 9.4* 4,204  HGB 11.2*   < > 10.7* 10.9* 13.0*  HCT 33.9*   < > 33.2* 34.0* 41  MCV 91.1  --  93.0 91.6  --   PLT 256   < > 252 256 272   < > = values in this interval not displayed.   Lipid Panel: No results for input(s): CHOL, HDL, LDLCALC, TRIG, CHOLHDL, LDLDIRECT in the last 8760 hours. Lab Results  Component Value Date   HGBA1C 5.2 08/29/2018    Procedures since  last visit: No results found.  Assessment/Plan C. difficile colitis Pulsed vancomycin therapy for 8 weeks per Dr. Fuller Plan He has already done 4 weeks Discussed with the patient and nurses.  He continues to have few episodes of loose stool But will continue to contact isolation for another 4 weeks   Weight loss Dietory to talk to him again  about his food choices Urinary spasm with chronic Foley catheter I discussed with the patient it seems like his symptoms are better since the Foley catheter was changed.  Would not do a UA at this time unless his symptoms return   Other issues Weakness Labs done few weeks ago were completely normal Continues to be dependent for his ADLs  Is working with therapy would not be able to discharge to his apartment Neuropathy Symptoms controlled on high-dose of Neurontin Benign prostatic hyperplasia with urinary obstruction Continue with Foley catheter Chronic pain syndrome Pain seems controlled on as needed Norco OSA on CPAP His appointment would be canceled with pulmonary for adjustment of his CPAP  until he is off from the contact isolation Depression On Wellbutrin Consider Remeron appetite does not improve   Labs/tests ordered:  * No order type specified * Next appt:  Visit date not found

## 2020-04-14 ENCOUNTER — Other Ambulatory Visit: Payer: Self-pay | Admitting: *Deleted

## 2020-04-14 MED ORDER — HYDROCODONE-ACETAMINOPHEN 5-325 MG PO TABS: ORAL_TABLET | ORAL | 0 refills | Status: DC

## 2020-04-14 NOTE — Telephone Encounter (Signed)
[place].

## 2020-04-19 ENCOUNTER — Ambulatory Visit (INDEPENDENT_AMBULATORY_CARE_PROVIDER_SITE_OTHER): Payer: Medicare Other | Admitting: Gastroenterology

## 2020-04-19 ENCOUNTER — Encounter: Payer: Self-pay | Admitting: Gastroenterology

## 2020-04-19 VITALS — BP 112/52 | HR 64 | Temp 98.3°F

## 2020-04-19 DIAGNOSIS — A0472 Enterocolitis due to Clostridium difficile, not specified as recurrent: Secondary | ICD-10-CM | POA: Diagnosis not present

## 2020-04-19 DIAGNOSIS — R1312 Dysphagia, oropharyngeal phase: Secondary | ICD-10-CM | POA: Diagnosis not present

## 2020-04-19 NOTE — Progress Notes (Signed)
    History of Present Illness: This is an 86 year old male returning for follow-up of C. difficile colitis.  He is accompanied by his son.  He is a resident of [place].  He completed a standard 10-day course of vancomycin and his symptoms returned. He then began a vancomycin pulse taper course with a 61-month course of Florastor twice daily in mid March which she completed.  Several diet modifications were recommended.  He relates that for the past 10 days he has had a normal formed stool each day.  No further lower gastrointestinal complaints.  He does note a problem with frequent choking when drinking cold liquids.  Symptoms do not happen with medium temperature total warm liquids or with solids.  Current Medications, Allergies, Past Medical History, Past Surgical History, Family History and Social History were reviewed in [employer] record.   Physical Exam: General: Well developed, well nourished, elderly, no acute distress Head: Normocephalic and atraumatic Eyes:  sclerae anicteric, EOMI Ears: Normal auditory acuity Mouth: Not examined, mask on during Covid-19 pandemic Lungs: Clear throughout to auscultation Heart: Regular rate and rhythm; no murmurs, rubs or bruits Abdomen: Soft, non tender and non distended. No masses, hepatosplenomegaly or hernias noted. Normal Bowel sounds Rectal: Not done Musculoskeletal: Symmetrical with no gross deformities  Pulses:  Normal pulses noted Extremities: No clubbing, cyanosis, edema or deformities noted Neurological: Alert oriented x 4, grossly nonfocal Psychological:  Alert and cooperative. Normal mood and affect   Assessment and Recommendations:  1. C diff colitis, resolved.  He completed a pulsed taper regimen.  Continue Florastor 250 mg twice daily for 2 months.  Recommended Florastor during all future courses of antibiotics and for 2 weeks thereafter.  Follow-up with PCP.  GI follow-up as needed  2. Oropharyngeal.  Chokes on very cold liquids.  Advised avoiding cold liquids.  If symptoms persist primary physician can consider a speech pathology assisted modified barium swallow study.  Follow-up with PCP.  GI follow-up as needed.  3.  Barrett's esophagus GERD.  No longer in the Barrett's surveillance program due to age.  Follow standard antireflux measures and continue pantoprazole 40 mg daily long-term.  Follow-up with PCP.  GI follow-up as needed.

## 2020-04-19 NOTE — Patient Instructions (Addendum)
Avoid cold liquids.   Continue Florastor twice daily for 2 more months.  If patient is put on any antibiotics in the future, he needs to remain on Florastor twice daily during the antibiotic treatment and for 2 weeks after treatment.    Thank you for choosing me and [facility].  Pricilla Riffle. Dagoberto Ligas., MD., Marval Regal'

## 2020-04-28 ENCOUNTER — Telehealth: Payer: Self-pay | Admitting: Adult Health

## 2020-04-28 NOTE — Telephone Encounter (Signed)
Andreas Newport from [facility] called wanting to speak to RN about the pt's appt. She would like to know if she should call [facility] first before the pt is seen here. Please advise.

## 2020-04-28 NOTE — Telephone Encounter (Signed)
LMVM for Charles [name] that returned call.

## 2020-04-28 NOTE — Telephone Encounter (Signed)
Charles [name] called back.  She will call [facility] relating to order done previously and will then call us back after this to set up 6 month appt (after him being on the cpap for greater then 30 days.

## 2020-04-29 ENCOUNTER — Encounter: Payer: Self-pay | Admitting: Internal Medicine

## 2020-04-29 ENCOUNTER — Non-Acute Institutional Stay (SKILLED_NURSING_FACILITY): Payer: Medicare Other | Admitting: Internal Medicine

## 2020-04-29 DIAGNOSIS — N138 Other obstructive and reflux uropathy: Secondary | ICD-10-CM | POA: Diagnosis not present

## 2020-04-29 DIAGNOSIS — G250 Essential tremor: Secondary | ICD-10-CM | POA: Diagnosis not present

## 2020-04-29 DIAGNOSIS — M5441 Lumbago with sciatica, right side: Secondary | ICD-10-CM | POA: Diagnosis not present

## 2020-04-29 DIAGNOSIS — N401 Enlarged prostate with lower urinary tract symptoms: Secondary | ICD-10-CM | POA: Diagnosis not present

## 2020-04-29 DIAGNOSIS — G8929 Other chronic pain: Secondary | ICD-10-CM

## 2020-04-29 DIAGNOSIS — F339 Major depressive disorder, recurrent, unspecified: Secondary | ICD-10-CM | POA: Diagnosis not present

## 2020-04-29 DIAGNOSIS — M5442 Lumbago with sciatica, left side: Secondary | ICD-10-CM

## 2020-04-29 DIAGNOSIS — A0472 Enterocolitis due to Clostridium difficile, not specified as recurrent: Secondary | ICD-10-CM | POA: Diagnosis not present

## 2020-04-29 DIAGNOSIS — I10 Essential (primary) hypertension: Secondary | ICD-10-CM | POA: Diagnosis not present

## 2020-04-29 NOTE — Progress Notes (Signed)
Location:    [place] Room Number: 26 Place of Service:  SNF [phone]) Provider:  Veleta Miners MD   [name], MD  Patient Care Team: [name], MD as PCP - General (Internal Medicine) [name], MD (Hematology and Oncology) [name], MD as Attending Physician (Urology) [name], MD (Gastroenterology)  Extended Emergency Contact Information Primary Emergency Contact: Zamar, Hooven Mobile Phone: [phone] Relation: Son Preferred language: English Interpreter needed? No Secondary Emergency Contact: Saravia,Janet H Address: [address]          [address]          [place], [place] [address] [name] of [place] Phone: [phone] Mobile Phone: [phone] Relation: Spouse  Code Status:  Full Code Goals of care: Advanced Directive information Advanced Directives 04/29/2020  Does Patient Have a Medical Advance Directive? Yes  Type of Paramedic of [place];Living will  Does patient want to make changes to medical advance directive? No - Patient declined  Copy of [place] in Chart? Yes - validated most recent copy scanned in chart (See row information)  Would patient like information on creating a medical advance directive? -     Chief Complaint  Patient presents with  . Acute Visit    Follow up of weight loss    HPI:  Pt is a 86 y.o. male seen today for an acute visit for Weight loss , Poor Appetite  Patient has number of medical prblems including Chronic Diarrhea, H/O AVM, Barretts Esphagus, OSA on CPAP, Hypertension, h/o Prostate cancer wih Chronic Foley Cathter, Chronic Pain due to Kyphosis andNeuropathy Was admitted in the hospital from 02/8 -02/13 for C Diff Colitis and Weakness C diff Colitis Resolved right now Stools much more formed Finished long Course of Vanco Weight loss Has lost 15 pounds. Appetite is still poor Though it has stabilized  recently Bladder spasm  Continues to be issue with his Chronic Bladder Weakness Continues to be dependent for his ADLS and transfers   Past Medical History:  Diagnosis Date  . Anemia   . Arthritis   . AVM (arteriovenous malformation)   . Barrett's esophagus   . BPH (benign prostatic hyperplasia)   . Cataract   . Constipation   . CPAP (continuous positive airway pressure) dependence   . Depression   . Diverticulosis    diverticular bleeding  . Diverticulosis   . GERD (gastroesophageal reflux disease)   . History of radiation therapy   . HOH (hard of hearing)    wears hearing aids  . Hyperlipidemia   . Hypoglycemia   . Lymphocytosis 06/2012  . Mastodynia   . Murmur    slight  . Nasal congestion   . Osteoporosis   . Osteoporosis   . Prostate cancer ([place])   . Sleep apnea    wears CPAP  . Urinary retention with incomplete bladder emptying    pt uses bathroom and within 30 mins needs to go again  . Vitamin D deficiency    Past Surgical History:  Procedure Laterality Date  . AMPUTATION TOE Right 09/20/2017   Procedure: Right 4th toe amputation;  Surgeon: Wylene Simmer, MD;  Location: [facility];  Service: Orthopedics;  Laterality: Right;  foot block  . AMPUTATION TOE Right 02/05/2019   Procedure: RIGHT GREAT TOE AMPUTATION;  Surgeon: Leandrew Koyanagi, MD;  Location: [facility];  Service: Orthopedics;  Laterality: Right;  . BACK  SURGERY  04/2013  . back surgury     f  . BLEPHAROPLASTY Bilateral 02/23/15  . CATARACT EXTRACTION    . COLONOSCOPY    . COLONOSCOPY N/A 10/02/2017   Procedure: COLONOSCOPY;  Surgeon: Doran Stabler, MD;  Location: [facility];  Service: Gastroenterology;  Laterality: N/A;  . COLONOSCOPY WITH PROPOFOL N/A 10/01/2017   Procedure: COLONOSCOPY WITH PROPOFOL;  Surgeon: Doran Stabler, MD;  [facility];  Service: Gastroenterology;  Laterality: N/A;  . COLONOSCOPY WITH PROPOFOL N/A 03/07/2018   Procedure:  COLONOSCOPY WITH PROPOFOL;  Surgeon: [name], MD;  [facility];  Service: Endoscopy;  Laterality: N/A;  . ESOPHAGOGASTRODUODENOSCOPY (EGD) WITH PROPOFOL N/A 10/01/2017   Procedure: ESOPHAGOGASTRODUODENOSCOPY (EGD) WITH PROPOFOL;  Surgeon: Doran Stabler, MD;  [facility];  Service: Gastroenterology;  Laterality: N/A;  . EYE SURGERY Bilateral   . FINGER SURGERY Right    right thumb  . HARDWARE REMOVAL Right 05/15/2016   Procedure: Right Lumbar Two-Lumbar Five Removal of Hardware;  Surgeon: Kristeen Miss, MD;  Location: [facility];  Service: Neurosurgery;  Laterality: Right;  Right L2-5 removal of hardware  . INGUINAL HERNIA REPAIR     x4  . IR RADIOLOGIST EVAL & MGMT  07/10/2018  . IR RADIOLOGIST EVAL & MGMT  09/05/2018  . IR SACROPLASTY BILATERAL  07/15/2018  . REPAIR SPIGELIAN HERNIA  2011  . [place] ,2014  . TONSILLECTOMY    . UPPER GASTROINTESTINAL ENDOSCOPY    . VASECTOMY      Allergies  Allergen Reactions  . Celebrex [Celecoxib] Other (See Comments)    Created stomach ulcers.  Yvette Rack [Cyclobenzaprine] Other (See Comments)    Caught in hiatal hernia and caused extreme pain    Allergies as of 04/29/2020      Reactions   Celebrex [celecoxib] Other (See Comments)   Created stomach ulcers.   Flexeril [cyclobenzaprine] Other (See Comments)   Caught in hiatal hernia and caused extreme pain      Medication List       Accurate as of Apr 29, 2020 10:49 AM. If you have any questions, ask your nurse or doctor.        STOP taking these medications   vancomycin 125 MG capsule Commonly known as: VANCOCIN Stopped by: [name], MD     TAKE these medications   acetaminophen 500 MG tablet Commonly known as: TYLENOL Take 500 mg by mouth 3 (three) times daily.   buPROPion 150 MG 12 hr tablet Commonly known as: WELLBUTRIN SR Take 150 mg by mouth 2 (two) times daily.   CENTRUM ADULTS PO Take 1 tablet by mouth daily.     PRESERVISION AREDS 2 PO Take 1 capsule by mouth 2 (two) times daily.   cholecalciferol 1000 units tablet Commonly known as: VITAMIN D Take 1,000 Units by mouth daily.   diclofenac Sodium 1 % Gel Commonly known as: VOLTAREN Apply 2 g topically 4 (four) times daily.   gabapentin 300 MG capsule Commonly known as: NEURONTIN Take 300 mg by mouth 2 (two) times daily.   gabapentin 100 MG capsule Commonly known as: NEURONTIN Take 100 mg by mouth every morning.   HYDROcodone-acetaminophen 5-325 MG tablet Commonly known as: NORCO/VICODIN Take one tablet by mouth every 6 hours as needed for moderate pain. Do not exceed 4gm of Tylenol in 24 hours.   Lactase Enzyme 3000 units tablet Generic drug: lactase Take 4,000 Units by mouth 3 (  three) times daily with meals.   lovastatin 20 MG tablet Commonly known as: MEVACOR Take 20 mg by mouth at bedtime.   pantoprazole 40 MG tablet Commonly known as: PROTONIX Take 40 mg by mouth every morning.   propranolol 10 MG tablet Commonly known as: INDERAL Take 10 mg by mouth daily.   Refresh Repair 0.5-0.9 % ophthalmic solution Generic drug: carboxymethylcellul-glycerin Place 1 drop into both eyes 3 (three) times daily.   rOPINIRole 1 MG tablet Commonly known as: REQUIP TAKE 2 TABLETS(2 MG) BY MOUTH AT BEDTIME   saccharomyces boulardii 250 MG capsule Commonly known as: FLORASTOR Take 250 mg by mouth 2 (two) times daily.   zinc oxide 20 % ointment Apply 1 application topically as needed for irritation.       Review of Systems  Constitutional: Positive for activity change, appetite change and unexpected weight change.  HENT: Negative.   Respiratory: Negative.   Cardiovascular: Negative.   Gastrointestinal: Negative.   Genitourinary:       C/O Bladder Spasms  Musculoskeletal: Positive for back pain, gait problem and myalgias.  Skin: Negative.   Neurological: Positive for weakness.  Psychiatric/Behavioral: Positive for dysphoric  mood.    Immunization History  Administered Date(s) Administered  . Influenza, High Dose Seasonal PF 09/17/2019  . Influenza-Unspecified 09/16/2018  . Moderna SARS-COVID-2 Vaccination 12/08/2019, 01/05/2020  . Pneumococcal Conjugate-13 12/29/2013  . Pneumococcal Polysaccharide-23 09/08/2005  . Tdap 05/25/2005, 08/08/2018  . Zoster Recombinat (Shingrix) 10/17/2017, 01/25/2018   Pertinent  Health Maintenance Due  Topic Date Due  . FOOT EXAM  Never done  . OPHTHALMOLOGY EXAM  Never done  . URINE MICROALBUMIN  Never done  . INFLUENZA VACCINE  07/04/2020  . PNA vac Low Risk Adult  Completed  . HEMOGLOBIN A1C  Discontinued   Fall Risk  01/01/2019 09/24/2018 01/04/2017 10/06/2016 09/29/2016  Falls in the past year? 0 No No No No   Functional Status Survey:    Vitals:   04/29/20 1043  BP: 139/65  Pulse: 62  Resp: 18  Temp: 97.8 F (36.6 C)  SpO2: 98%  Weight: 153 lb 1.6 oz (69.4 kg)  Height: 5\' 6"  (1.676 m)   Body mass index is 24.71 kg/m. Physical Exam  Constitutional: Oriented to person, place, and time. Well-developed and well-nourished.  HENT:  Head: Normocephalic.  Mouth/Throat: Oropharynx is clear and moist.  Eyes: Pupils are equal, round, and reactive to light.  Neck: Neck supple.  Cardiovascular: Normal rate and normal heart sounds.  No murmur heard. Pulmonary/Chest: Effort normal and breath sounds normal. No respiratory distress. No wheezes.  has no rales.  Abdominal: Soft. Bowel sounds are normal. No distension. There is no tenderness. There is no rebound.  Musculoskeletal: No edema.  Lymphadenopathy: none Neurological: Alert and oriented to person, place, and time.  Skin: Skin is warm and dry.  Psychiatric: Continues to have Dysphoric mood   Labs reviewed: Recent Labs    01/15/20 0240 01/15/20 0240 01/16/20 0243 01/17/20 0241 02/09/20 0000  NA 135   < > 135 135 138  K 3.9   < > 4.2 3.8 4.1  CL 111   < > 112* 108 105  CO2 16*   < > 18* 20* 24*    GLUCOSE 98  --  126* 100*  --   BUN 11   < > 11 12 17   CREATININE 0.74   < > 0.70 0.61 0.7  CALCIUM 7.6*   < > 7.6* 7.7* 8.9  MG 1.8  --  1.8 1.7  --    < > = values in this interval not displayed.   Recent Labs    01/12/20 2110 02/09/20 0000  AST 38 31  ALT 18 10  ALKPHOS 76 98  BILITOT 0.6  --   PROT 6.5  --   ALBUMIN 3.0* 3.4*   Recent Labs    01/15/20 0240 01/15/20 0240 01/16/20 0243 01/17/20 0241 02/09/20 0000  WBC 18.0*   < > 16.8* 11.6* 6.6  NEUTROABS 15.7*   < > 14.4* 9.4* 4,204  HGB 11.2*   < > 10.7* 10.9* 13.0*  HCT 33.9*   < > 33.2* 34.0* 41  MCV 91.1  --  93.0 91.6  --   PLT 256   < > 252 256 272   < > = values in this interval not displayed.   Lab Results  Component Value Date   TSH 2.38 02/09/2020   Lab Results  Component Value Date   HGBA1C 5.2 08/29/2018   Lab Results  Component Value Date   CHOL 158 08/29/2018   HDL 43 08/29/2018   LDLCALC 98 08/29/2018   TRIG 76 08/29/2018    Significant Diagnostic Results in last 30 days:  No results found.  Assessment/Plan Weight loss and Depression Has stabilized Will continue to watch Would not change Wellbutrin dose yet Consider Remeron if not better  H/O C Diff Colitis Finished Vanco Pulse therapy Doing better Continue to monitor Weakness Dependent for his ADLS Will stay in SNF now Neuropathy Symptoms controlled on high-dose of Neurontin Benign prostatic hyperplasia with urinary obstruction Continue with Foley catheter Chronic pain syndrome Pain seems controlled on as needed Norco OSA on CPAP Will need follow up with Pulmonology for Adju of his CPAP Hyperlipidemia Repeat Lipid Panel   Family/ staff Communication:   Labs/tests ordered:

## 2020-05-04 DIAGNOSIS — R52 Pain, unspecified: Secondary | ICD-10-CM | POA: Diagnosis not present

## 2020-05-04 DIAGNOSIS — R2689 Other abnormalities of gait and mobility: Secondary | ICD-10-CM | POA: Diagnosis not present

## 2020-05-04 DIAGNOSIS — N3000 Acute cystitis without hematuria: Secondary | ICD-10-CM | POA: Diagnosis not present

## 2020-05-04 DIAGNOSIS — R2681 Unsteadiness on feet: Secondary | ICD-10-CM | POA: Diagnosis not present

## 2020-05-04 DIAGNOSIS — M545 Low back pain: Secondary | ICD-10-CM | POA: Diagnosis not present

## 2020-05-04 DIAGNOSIS — R293 Abnormal posture: Secondary | ICD-10-CM | POA: Diagnosis not present

## 2020-05-06 LAB — COMPREHENSIVE METABOLIC PANEL
Albumin: 3.5 (ref 3.5–5.0)
Calcium: 8.8 (ref 8.7–10.7)
GFR calc Af Amer: 96
GFR calc non Af Amer: 83
Globulin: 2.8

## 2020-05-06 LAB — BASIC METABOLIC PANEL
BUN: 15 (ref 4–21)
CO2: 25 — AB (ref 13–22)
Chloride: 106 (ref 99–108)
Creatinine: 0.8 (ref 0.6–1.3)
Glucose: 65
Potassium: 4.2 (ref 3.4–5.3)
Sodium: 139 (ref 137–147)

## 2020-05-06 LAB — CBC AND DIFFERENTIAL
HCT: 39 — AB (ref 41–53)
Hemoglobin: 12.9 — AB (ref 13.5–17.5)
Platelets: 224 (ref 150–399)
WBC: 7.8

## 2020-05-06 LAB — HEPATIC FUNCTION PANEL
ALT: 9 — AB (ref 10–40)
AST: 27 (ref 14–40)
Alkaline Phosphatase: 91 (ref 25–125)
Bilirubin, Total: 0.4

## 2020-05-06 LAB — LIPID PANEL
HDL: 34 — AB (ref 35–70)
LDL Cholesterol: 103
Triglycerides: 154 (ref 40–160)

## 2020-05-06 LAB — CBC: RBC: 4.22 (ref 3.87–5.11)

## 2020-05-10 DIAGNOSIS — M545 Low back pain: Secondary | ICD-10-CM | POA: Diagnosis not present

## 2020-05-10 DIAGNOSIS — R52 Pain, unspecified: Secondary | ICD-10-CM | POA: Diagnosis not present

## 2020-05-10 DIAGNOSIS — R293 Abnormal posture: Secondary | ICD-10-CM | POA: Diagnosis not present

## 2020-05-10 DIAGNOSIS — R2689 Other abnormalities of gait and mobility: Secondary | ICD-10-CM | POA: Diagnosis not present

## 2020-05-10 DIAGNOSIS — N3000 Acute cystitis without hematuria: Secondary | ICD-10-CM | POA: Diagnosis not present

## 2020-05-10 DIAGNOSIS — R2681 Unsteadiness on feet: Secondary | ICD-10-CM | POA: Diagnosis not present

## 2020-05-11 DIAGNOSIS — M545 Low back pain: Secondary | ICD-10-CM | POA: Diagnosis not present

## 2020-05-11 DIAGNOSIS — R52 Pain, unspecified: Secondary | ICD-10-CM | POA: Diagnosis not present

## 2020-05-11 DIAGNOSIS — R293 Abnormal posture: Secondary | ICD-10-CM | POA: Diagnosis not present

## 2020-05-11 DIAGNOSIS — R2681 Unsteadiness on feet: Secondary | ICD-10-CM | POA: Diagnosis not present

## 2020-05-11 DIAGNOSIS — R2689 Other abnormalities of gait and mobility: Secondary | ICD-10-CM | POA: Diagnosis not present

## 2020-05-11 DIAGNOSIS — N3000 Acute cystitis without hematuria: Secondary | ICD-10-CM | POA: Diagnosis not present

## 2020-05-13 ENCOUNTER — Ambulatory Visit (INDEPENDENT_AMBULATORY_CARE_PROVIDER_SITE_OTHER): Payer: Medicare Other | Admitting: Orthopaedic Surgery

## 2020-05-13 ENCOUNTER — Encounter: Payer: Self-pay | Admitting: Orthopaedic Surgery

## 2020-05-13 ENCOUNTER — Telehealth: Payer: Self-pay | Admitting: *Deleted

## 2020-05-13 DIAGNOSIS — M2041 Other hammer toe(s) (acquired), right foot: Secondary | ICD-10-CM

## 2020-05-13 NOTE — Progress Notes (Signed)
Office Visit Note   Patient: Charles [name]           Date of Birth: 1931-01-15           MRN: [id number] Visit Date: 05/13/2020              Requested by: Charles Dad, MD [place],  [place] [id number] PCP: Charles Dad, MD   Assessment & Plan: Visit Diagnoses:  1. Hammertoe of second toe of right foot     Plan: My impression is painful right second hammertoe deformity. He has degenerative changes based on the x-rays I have reviewed. He has not received much relief from orthotics and shoes. We discussed the possibility of performing a second toe amputation as I do not feel that he is able to go through the rehab and recovery necessary for a hammertoe correction. He will think about this and discuss with his wife and he will let us know how he wants to proceed.  Follow-Up Instructions: Return if symptoms worsen or fail to improve.   Orders:  No orders of the defined types were placed in this encounter.  No orders of the defined types were placed in this encounter.     Procedures: No procedures performed   Clinical Data: No additional findings.   Subjective: Chief Complaint  Patient presents with  . Right Foot - Pain    Charles [name] is an 86 year old gentleman who comes in for evaluation of a painful right second toe due to hammertoe deformity. He is a resident at a friend's home [place]. He states that the toe is painful with walking and weightbearing. Has tried using spacers and wearing accommodative shoes by the does not feel much relief from these measures. He is having difficulty walking due to the pain. He walks with a walker. He is status post great toe amputation and partial fourth toe amputation.   Review of Systems  Constitutional: Negative.   All other systems reviewed and are negative.    Objective: Vital Signs: There were no vitals taken for this visit.  Physical Exam Vitals and nursing note reviewed.  Constitutional:      Appearance:  He is well-developed.  Pulmonary:     Effort: Pulmonary effort is normal.  Abdominal:     Palpations: Abdomen is soft.  Skin:    General: Skin is warm.  Neurological:     Mental Status: He is alert and oriented to person, place, and time.  Psychiatric:        Behavior: Behavior normal.        Thought Content: Thought content normal.        Judgment: Judgment normal.     Ortho Exam Right foot shows postsurgical changes without any complications. He has a rigid hammertoe deformity of the second toe. This is painful to passive motion and to palpation. There is no evidence of infection. Specialty Comments:  No specialty comments available.  Imaging: No results found.   PMFS History: Patient Active Problem List   Diagnosis Date Noted  . Weight loss 03/16/2020  . Pain of toe of right foot 03/01/2020  . Rash 02/24/2020  . C. difficile diarrhea 01/17/2020  . Neck muscle spasm 01/17/2020  . Weakness generalized 01/13/2020  . UTI (urinary tract infection) 01/13/2020  . Pain in right elbow 05/30/2019  . Osteomyelitis of second toe of right foot ([place]) 04/03/2019  . Hammertoe of second toe of right foot 04/03/2019  . Ulcer of second  toe of right foot ([place]) 03/21/2019  . Osteomyelitis of great toe of right foot ([place]) 12/05/2018  . Sacral insufficiency fracture 07/04/2018  . Unsteady gait 07/04/2018  . Mild cognitive impairment 07/04/2018  . Neuropathy 07/04/2018  . CAD (coronary artery disease) 05/29/2018  . AVM (arteriovenous malformation) of colon   . Radiation proctitis   . Amputation of right great toe ([place]) 03/01/2018  . Diverticulosis of colon with hemorrhage   . Acute blood loss anemia   . History of back surgery 10/03/2017  . Rectal bleeding 10/03/2017  . Hematochezia   . Lower GI bleeding 09/29/2017  . Treatment-emergent central sleep apnea 06/25/2017  . Chronic bilateral low back pain with bilateral sciatica 01/17/2017  . Post laminectomy syndrome 01/17/2017  .  Chronic pain syndrome 01/17/2017  . Sacroiliitis ([place]) 01/17/2017  . Piriformis syndrome of right side 01/17/2017  . Malignant neoplasm of prostate ([place]) 07/19/2016  . Fixation hardware in spine 05/15/2016  . Hypoglycemic reaction 02/02/2015  . Essential tremor 12/11/2014  . OSA on CPAP 04/24/2014  . 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 Hypersomnia with sleep apnea 04/24/2014  . Depression, recurrent ([place]) 04/18/2013  . BPH (benign prostatic hyperplasia) 04/18/2013  . Impingement syndrome of right shoulder 04/14/2013    Class: Acute  . Scoliosis or kyphoscoliosis 04/08/2013    Class: Chronic  . Lumbosacral spondylosis without myelopathy 04/08/2013    Class: Chronic  . Acute respiratory failure ([place]) 04/08/2013  . Essential hypertension 04/08/2013  . Lymphocytosis   . Diaphragmatic hernia 11/21/2010  . DIVERTICULAR DISEASE 11/21/2010  . BARRETT'S ESOPHAGUS 02/22/2010  . GERD 07/20/2008  . INGUINAL HERNIA, LEFT, SMALL 07/20/2008  . OTH VENTRAL HERN W/O MENTION OBSTRUCTION/GANGREN 07/20/2008  . CONSTIPATION 07/20/2008  . DYSPHAGIA 07/20/2008   Past Medical History:  Diagnosis Date  . Anemia   . Arthritis   . AVM (arteriovenous malformation)   . Barrett's esophagus   . BPH (benign prostatic hyperplasia)   . Cataract   . Constipation   . CPAP (continuous positive airway pressure) dependence   . Depression   . Diverticulosis    diverticular bleeding  . Diverticulosis   . GERD (gastroesophageal reflux disease)   . History of radiation therapy   . HOH (hard of hearing)    wears hearing aids  . Hyperlipidemia   . Hypoglycemia   . Lymphocytosis 06/2012  . Mastodynia   . Murmur    slight  . Nasal congestion   . Osteoporosis   . Osteoporosis   . Prostate cancer ([place])   . Sleep apnea    wears CPAP  . Urinary retention with incomplete bladder emptying    pt uses bathroom and within 30 mins needs to go again  . Vitamin D deficiency     Family History  Problem  Relation Age of Onset  . Prostate cancer Father   . CAD Mother   . Arthritis-Osteo Brother   . Colon cancer Neg Hx   . Stomach cancer Neg Hx     Past Surgical History:  Procedure Laterality Date  . AMPUTATION TOE Right 09/20/2017   Procedure: Right 4th toe amputation;  Surgeon: Wylene Simmer, MD;  Location: [facility];  Service: Orthopedics;  Laterality: Right;  foot block  . AMPUTATION TOE Right 02/05/2019   Procedure: RIGHT GREAT TOE AMPUTATION;  Surgeon: Leandrew Koyanagi, MD;  Location: [facility];  Service: Orthopedics;  Laterality: Right;  . BACK SURGERY  04/2013  . back surgury  f  . BLEPHAROPLASTY Bilateral 02/23/15  . CATARACT EXTRACTION    . COLONOSCOPY    . COLONOSCOPY N/A 10/02/2017   Procedure: COLONOSCOPY;  Surgeon: Doran Stabler, MD;  Location: [facility];  Service: Gastroenterology;  Laterality: N/A;  . COLONOSCOPY WITH PROPOFOL N/A 10/01/2017   Procedure: COLONOSCOPY WITH PROPOFOL;  Surgeon: Doran Stabler, MD;  [facility];  Service: Gastroenterology;  Laterality: N/A;  . COLONOSCOPY WITH PROPOFOL N/A 03/07/2018   Procedure: COLONOSCOPY WITH PROPOFOL;  Surgeon: [name], MD;  [facility];  Service: Endoscopy;  Laterality: N/A;  . ESOPHAGOGASTRODUODENOSCOPY (EGD) WITH PROPOFOL N/A 10/01/2017   Procedure: ESOPHAGOGASTRODUODENOSCOPY (EGD) WITH PROPOFOL;  Surgeon: Doran Stabler, MD;  [facility];  Service: Gastroenterology;  Laterality: N/A;  . EYE SURGERY Bilateral   . FINGER SURGERY Right    right thumb  . HARDWARE REMOVAL Right 05/15/2016   Procedure: Right Lumbar Two-Lumbar Five Removal of Hardware;  Surgeon: Kristeen Miss, MD;  Location: [facility];  Service: Neurosurgery;  Laterality: Right;  Right L2-5 removal of hardware  . INGUINAL HERNIA REPAIR     x4  . IR RADIOLOGIST EVAL & MGMT  07/10/2018  . IR RADIOLOGIST EVAL & MGMT  09/05/2018  . IR SACROPLASTY BILATERAL  07/15/2018  .  REPAIR SPIGELIAN HERNIA  2011  . [place] ,2014  . TONSILLECTOMY    . UPPER GASTROINTESTINAL ENDOSCOPY    . VASECTOMY     Social History   Occupational History  . 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 Smoking status: Never Smoker  . Smokeless tobacco: Never Used  Vaping Use  . Vaping Use: Never used  Substance and Sexual Activity  . Alcohol use: No  . Drug use: No  . Sexual activity: Not Currently      z

## 2020-05-13 NOTE — Telephone Encounter (Signed)
I called [facility] and spoke to CS.  They have pt coming in for appt 05-20-20 at 1000 with technician.  They have orders, which from CB/RN she deleted.  They reached out to nursing home 05-05-20 and was made this appt. To get him back on cpap.

## 2020-05-14 ENCOUNTER — Encounter: Payer: Self-pay | Admitting: Nurse Practitioner

## 2020-05-14 ENCOUNTER — Non-Acute Institutional Stay (SKILLED_NURSING_FACILITY): Payer: Medicare Other | Admitting: Nurse Practitioner

## 2020-05-14 DIAGNOSIS — G894 Chronic pain syndrome: Secondary | ICD-10-CM | POA: Diagnosis not present

## 2020-05-14 DIAGNOSIS — N401 Enlarged prostate with lower urinary tract symptoms: Secondary | ICD-10-CM | POA: Diagnosis not present

## 2020-05-14 DIAGNOSIS — N138 Other obstructive and reflux uropathy: Secondary | ICD-10-CM | POA: Diagnosis not present

## 2020-05-14 DIAGNOSIS — F339 Major depressive disorder, recurrent, unspecified: Secondary | ICD-10-CM

## 2020-05-14 DIAGNOSIS — K219 Gastro-esophageal reflux disease without esophagitis: Secondary | ICD-10-CM

## 2020-05-14 DIAGNOSIS — G629 Polyneuropathy, unspecified: Secondary | ICD-10-CM | POA: Diagnosis not present

## 2020-05-14 DIAGNOSIS — G250 Essential tremor: Secondary | ICD-10-CM | POA: Diagnosis not present

## 2020-05-14 DIAGNOSIS — M79674 Pain in right toe(s): Secondary | ICD-10-CM | POA: Diagnosis not present

## 2020-05-14 DIAGNOSIS — G4752 REM sleep behavior disorder: Secondary | ICD-10-CM

## 2020-05-14 NOTE — Assessment & Plan Note (Signed)
Stable, continue Pantoprazole 40mg qd.  

## 2020-05-14 NOTE — Assessment & Plan Note (Signed)
Stable, continue Propranolol.

## 2020-05-14 NOTE — Assessment & Plan Note (Signed)
Tingling, numbness BLE, continue Gabapentin.

## 2020-05-14 NOTE — Assessment & Plan Note (Signed)
Stable, continue Requip

## 2020-05-14 NOTE — Assessment & Plan Note (Signed)
Urinary retention, failed Foley removal, see Urology 06/10/20

## 2020-05-14 NOTE — Assessment & Plan Note (Signed)
His mood is stable, continue Wellbutrin.  

## 2020-05-14 NOTE — Assessment & Plan Note (Signed)
Underwent surgeon eval.

## 2020-05-14 NOTE — Assessment & Plan Note (Signed)
Controlled, continue Tylenol 500mg  tid, Norco q6hr prn.

## 2020-05-14 NOTE — Progress Notes (Signed)
Location:   Room 26   Place of Service:   [place], [place] Provider:  Teryl Mcconaghy Valinda Party, MD  Patient Care Team: [name], MD as PCP - General (Internal Medicine) [name], MD (Hematology and Oncology) [name], MD as Attending Physician (Urology) [name], MD (Gastroenterology)  Extended Emergency Contact Information Primary Emergency Contact: Teaghan, [place] Mobile Phone: [phone] Relation: Son Preferred language: English Interpreter needed? No Secondary Emergency Contact: Savoia,Janet H Address: [address]          [address]          [place], [place] [address] [name] of [place] Phone: [phone] Mobile Phone: [phone] Relation: Spouse  Code Status:  DNR Goals of care: Advanced Directive information Advanced Directives 05/14/2020  Does Patient Have a Medical Advance Directive? Yes  Type of Paramedic of [place];Living will  Does patient want to make changes to medical advance directive? No - Patient declined  Copy of [place] in Chart? Yes - validated most recent copy scanned in chart (See row information)  Would patient like information on creating a medical advance directive? -     Chief Complaint  Patient presents with  . Medical Management of Chronic Issues    Routine visit    HPI:  Pt is a 86 y.o. male seen today for medical management of chronic diseases.    Hx of RLS/REM, stable, on Requip 2mg  qd. Essential tremor, stable, on Propranolol 10mg  qd. GERD, stable, on Pantoprazole 40mg  qd. Neuropathic pain in BLE, stable on Gabapentin 300mg  bid, 100mg  qd, Tylenol 500mg  tid, Norco q6hr prn. His mood is stable, on Wellbutrin 150mg  bid. Foley chronic, f/u Urology 06/10/20. Right 2nd toe pain, saw surgeon.      Past Medical History:  Diagnosis Date  . Anemia   . Arthritis   . AVM (arteriovenous malformation)   . Barrett's esophagus   . BPH  (benign prostatic hyperplasia)   . Cataract   . Constipation   . CPAP (continuous positive airway pressure) dependence   . Depression   . Diverticulosis    diverticular bleeding  . Diverticulosis   . GERD (gastroesophageal reflux disease)   . History of radiation therapy   . HOH (hard of hearing)    wears hearing aids  . Hyperlipidemia   . Hypoglycemia   . Lymphocytosis 06/2012  . Mastodynia   . Murmur    slight  . Nasal congestion   . Osteoporosis   . Osteoporosis   . Prostate cancer ([place])   . Sleep apnea    wears CPAP  . Urinary retention with incomplete bladder emptying    pt uses bathroom and within 30 mins needs to go again  . Vitamin D deficiency    Past Surgical History:  Procedure Laterality Date  . AMPUTATION TOE Right 09/20/2017   Procedure: Right 4th toe amputation;  Surgeon: Wylene Simmer, MD;  Location: [facility];  Service: Orthopedics;  Laterality: Right;  foot block  . AMPUTATION TOE Right 02/05/2019   Procedure: RIGHT GREAT TOE AMPUTATION;  Surgeon: Leandrew Koyanagi, MD;  Location: [facility];  Service: Orthopedics;  Laterality: Right;  . BACK SURGERY  04/2013  . back surgury     f  . BLEPHAROPLASTY Bilateral 02/23/15  . CATARACT EXTRACTION    . COLONOSCOPY    . COLONOSCOPY N/A 10/02/2017   Procedure: COLONOSCOPY;  Surgeon: Nelida Meuse  III, MD;  Location: [facility];  Service: Gastroenterology;  Laterality: N/A;  . COLONOSCOPY WITH PROPOFOL N/A 10/01/2017   Procedure: COLONOSCOPY WITH PROPOFOL;  Surgeon: Doran Stabler, MD;  Location: [facility];  Service: Gastroenterology;  Laterality: N/A;  . COLONOSCOPY WITH PROPOFOL N/A 03/07/2018   Procedure: COLONOSCOPY WITH PROPOFOL;  Surgeon: [name], MD;  Location: [facility];  Service: Endoscopy;  Laterality: N/A;  . ESOPHAGOGASTRODUODENOSCOPY (EGD) WITH PROPOFOL N/A 10/01/2017   Procedure: ESOPHAGOGASTRODUODENOSCOPY (EGD) WITH PROPOFOL;  Surgeon: Doran Stabler,  MD;  Location: [facility];  Service: Gastroenterology;  Laterality: N/A;  . EYE SURGERY Bilateral   . FINGER SURGERY Right    right thumb  . HARDWARE REMOVAL Right 05/15/2016   Procedure: Right Lumbar Two-Lumbar Five Removal of Hardware;  Surgeon: Kristeen Miss, MD;  Location: [facility];  Service: Neurosurgery;  Laterality: Right;  Right L2-5 removal of hardware  . INGUINAL HERNIA REPAIR     x4  . IR RADIOLOGIST EVAL & MGMT  07/10/2018  . IR RADIOLOGIST EVAL & MGMT  09/05/2018  . IR SACROPLASTY BILATERAL  07/15/2018  . REPAIR SPIGELIAN HERNIA  2011  . [place] ,2014  . TONSILLECTOMY    . UPPER GASTROINTESTINAL ENDOSCOPY    . VASECTOMY      Allergies  Allergen Reactions  . Celebrex [Celecoxib] Other (See Comments)    Created stomach ulcers.  Yvette Rack [Cyclobenzaprine] Other (See Comments)    Caught in hiatal hernia and caused extreme pain    Allergies as of 05/14/2020      Reactions   Celebrex [celecoxib] Other (See Comments)   Created stomach ulcers.   Flexeril [cyclobenzaprine] Other (See Comments)   Caught in hiatal hernia and caused extreme pain      Medication List       Accurate as of May 14, 2020  5:18 PM. If you have any questions, ask your nurse or doctor.        acetaminophen 500 MG tablet Commonly known as: TYLENOL Take 500 mg by mouth 3 (three) times daily.   buPROPion 150 MG 12 hr tablet Commonly known as: WELLBUTRIN SR Take 150 mg by mouth 2 (two) times daily.   CENTRUM ADULTS PO Take 1 tablet by mouth daily.   PRESERVISION AREDS 2 PO Take 1 capsule by mouth 2 (two) times daily.   cholecalciferol 1000 units tablet Commonly known as: VITAMIN D Take 1,000 Units by mouth daily.   diclofenac Sodium 1 % Gel Commonly known as: VOLTAREN Apply 2 g topically 4 (four) times daily.   gabapentin 300 MG capsule Commonly known as: NEURONTIN Take 300 mg by mouth 2 (two) times daily. due to numbness and tingling   gabapentin 100 MG  capsule Commonly known as: NEURONTIN Take 100 mg by mouth every morning.   HYDROcodone-acetaminophen 5-325 MG tablet Commonly known as: NORCO/VICODIN Take one tablet by mouth every 6 hours as needed for moderate pain. Do not exceed 4gm of Tylenol in 24 hours.   Lactase Enzyme 3000 units tablet Generic drug: lactase Take 4,500 Units by mouth 3 (three) times daily with meals.   lovastatin 20 MG tablet Commonly known as: MEVACOR Take 20 mg by mouth at bedtime.   pantoprazole 40 MG tablet Commonly known as: PROTONIX Take 40 mg by mouth every morning.   propranolol 10 MG tablet Commonly known as: INDERAL Take 10 mg by mouth daily.   Refresh Repair 0.5-0.9 % ophthalmic solution Generic drug: carboxymethylcellul-glycerin  Place 1 drop into both eyes 3 (three) times daily.   rOPINIRole 1 MG tablet Commonly known as: REQUIP TAKE 2 TABLETS(2 MG) BY MOUTH AT BEDTIME   saccharomyces boulardii 250 MG capsule Commonly known as: FLORASTOR Take 250 mg by mouth 2 (two) times daily.   zinc oxide 20 % ointment Apply 1 application topically as needed for irritation.       Review of Systems  Constitutional: Negative for fatigue, fever and unexpected weight change.  HENT: Positive for hearing loss. Negative for congestion and voice change.   Eyes: Negative for visual disturbance.  Respiratory: Negative for cough and shortness of breath.   Cardiovascular: Negative for palpitations and leg swelling.  Gastrointestinal: Negative for abdominal distention, abdominal pain and diarrhea.       Fecal incontinence  Genitourinary: Positive for difficulty urinating. Negative for dysuria.       Foley  Musculoskeletal: Positive for arthralgias, back pain, gait problem and neck pain.       The right 2nd toe pain  Skin: Negative for color change.  Neurological: Positive for tremors. Negative for dizziness and speech difficulty.       Memory lapses.   Psychiatric/Behavioral: Negative for agitation,  behavioral problems and sleep disturbance.    Immunization History  Administered Date(s) Administered  . Influenza, High Dose Seasonal PF 09/17/2019  . Influenza-Unspecified 09/16/2018  . Moderna SARS-COVID-2 Vaccination 12/08/2019, 01/05/2020  . Pneumococcal Conjugate-13 12/29/2013  . Pneumococcal Polysaccharide-23 09/08/2005  . Tdap 05/25/2005, 08/08/2018  . Zoster Recombinat (Shingrix) 10/17/2017, 01/25/2018   Pertinent  Health Maintenance Due  Topic Date Due  . FOOT EXAM  Never done  . OPHTHALMOLOGY EXAM  Never done  . URINE MICROALBUMIN  Never done  . INFLUENZA VACCINE  07/04/2020  . PNA vac Low Risk Adult  Completed  . HEMOGLOBIN A1C  Discontinued   Fall Risk  01/01/2019 09/24/2018 01/04/2017 10/06/2016 09/29/2016  Falls in the past year? 0 No No No No   Functional Status Survey:    Vitals:   05/14/20 1211  BP: 136/63  Pulse: 71  Resp: 18  Temp: 97.6 F (36.4 C)  SpO2: 96%  Weight: 155 lb 14.4 oz (70.7 kg)  Height: 5\' 6"  (1.676 m)   Body mass index is 25.16 kg/m. Physical Exam Vitals and nursing note reviewed.  Constitutional:      Appearance: Normal appearance.  HENT:     Head: Normocephalic and atraumatic.     Mouth/Throat:     Mouth: Mucous membranes are moist.  Eyes:     Extraocular Movements: Extraocular movements intact.     Conjunctiva/sclera: Conjunctivae normal.     Pupils: Pupils are equal, round, and reactive to light.  Cardiovascular:     Rate and Rhythm: Normal rate and regular rhythm.     Heart sounds: Murmur heard.      Comments: Weak DP pulse on the right Pulmonary:     Effort: Pulmonary effort is normal.     Breath sounds: No rales.  Abdominal:     General: Bowel sounds are normal.     Palpations: Abdomen is soft.     Tenderness: There is no abdominal tenderness.  Musculoskeletal:     Cervical back: Normal range of motion and neck supple.     Right lower leg: No edema.     Left lower leg: No edema.     Comments: The  thick  callous at the tip of the right 2nd hammer toe. S/p the right great and  4th toe amputation.   Skin:    General: Skin is warm and dry.  Neurological:     General: No focal deficit present.     Mental Status: He is alert. Mental status is at baseline.     Gait: Gait abnormal.     Comments: Oriented to person, place.   Psychiatric:        Mood and Affect: Mood normal.        Behavior: Behavior normal.        Thought Content: Thought content normal.        Judgment: Judgment normal.     Labs reviewed: Recent Labs    01/15/20 0240 01/15/20 0240 01/16/20 0243 01/16/20 0243 01/17/20 0241 02/09/20 0000 05/06/20 0000  NA 135   < > 135   < > 135 138 139  K 3.9   < > 4.2   < > 3.8 4.1 4.2  CL 111   < > 112*   < > 108 105 106  CO2 16*   < > 18*   < > 20* 24* 25*  GLUCOSE 98  --  126*  --  100*  --   --   BUN 11   < > 11   < > 12 17 15   CREATININE 0.74   < > 0.70   < > 0.61 0.7 0.8  CALCIUM 7.6*   < > 7.6*   < > 7.7* 8.9 8.8  MG 1.8  --  1.8  --  1.7  --   --    < > = values in this interval not displayed.   Recent Labs    01/12/20 2110 02/09/20 0000 05/06/20 0000  AST 38 31 27  ALT 18 10 9*  ALKPHOS 76 98 91  BILITOT 0.6  --   --   PROT 6.5  --   --   ALBUMIN 3.0* 3.4* 3.5   Recent Labs    01/15/20 0240 01/15/20 0240 01/16/20 0243 01/16/20 0243 01/17/20 0241 02/09/20 0000 05/06/20 0000  WBC 18.0*   < > 16.8*   < > 11.6* 6.6 7.8  NEUTROABS 15.7*   < > 14.4*  --  9.4* 4,204  --   HGB 11.2*   < > 10.7*   < > 10.9* 13.0* 12.9*  HCT 33.9*   < > 33.2*   < > 34.0* 41 39*  MCV 91.1  --  93.0  --  91.6  --   --   PLT 256   < > 252   < > 256 272 224   < > = values in this interval not displayed.   Lab Results  Component Value Date   TSH 2.38 02/09/2020   Lab Results  Component Value Date   HGBA1C 5.2 08/29/2018   Lab Results  Component Value Date   CHOL 158 08/29/2018   HDL 34 (A) 05/06/2020   LDLCALC 103 05/06/2020   TRIG 154 05/06/2020    Significant  Diagnostic Results in last 30 days:  No results found.  Assessment/Plan GERD Stable, continue Pantoprazole 40mg  qd.   Neuropathy Tingling, numbness BLE, continue Gabapentin.   REM sleep behavior disorder Stable, continue Requip  Pain of toe of right foot Underwent surgeon eval.   Depression, recurrent ([place]) His mood is stable, continue Wellbutrin.   Essential tremor Stable, continue Propranolol.   Chronic pain syndrome Controlled, continue Tylenol 500mg  tid, Norco q6hr prn.  BPH (benign prostatic hyperplasia) Urinary retention, failed Foley removal,  see Urology 06/10/20     Family/ staff Communication: plan of care reviewed with the patient and charge nurse.   Labs/tests ordered:  None  Time spend 25 minutes.

## 2020-05-18 DIAGNOSIS — R52 Pain, unspecified: Secondary | ICD-10-CM | POA: Diagnosis not present

## 2020-05-18 DIAGNOSIS — R2689 Other abnormalities of gait and mobility: Secondary | ICD-10-CM | POA: Diagnosis not present

## 2020-05-18 DIAGNOSIS — R293 Abnormal posture: Secondary | ICD-10-CM | POA: Diagnosis not present

## 2020-05-18 DIAGNOSIS — M545 Low back pain: Secondary | ICD-10-CM | POA: Diagnosis not present

## 2020-05-18 DIAGNOSIS — R2681 Unsteadiness on feet: Secondary | ICD-10-CM | POA: Diagnosis not present

## 2020-05-18 DIAGNOSIS — N3000 Acute cystitis without hematuria: Secondary | ICD-10-CM | POA: Diagnosis not present

## 2020-05-20 DIAGNOSIS — R293 Abnormal posture: Secondary | ICD-10-CM | POA: Diagnosis not present

## 2020-05-20 DIAGNOSIS — M545 Low back pain: Secondary | ICD-10-CM | POA: Diagnosis not present

## 2020-05-20 DIAGNOSIS — N3000 Acute cystitis without hematuria: Secondary | ICD-10-CM | POA: Diagnosis not present

## 2020-05-20 DIAGNOSIS — R52 Pain, unspecified: Secondary | ICD-10-CM | POA: Diagnosis not present

## 2020-05-20 DIAGNOSIS — R2681 Unsteadiness on feet: Secondary | ICD-10-CM | POA: Diagnosis not present

## 2020-05-20 DIAGNOSIS — R2689 Other abnormalities of gait and mobility: Secondary | ICD-10-CM | POA: Diagnosis not present

## 2020-05-25 DIAGNOSIS — M545 Low back pain: Secondary | ICD-10-CM | POA: Diagnosis not present

## 2020-05-25 DIAGNOSIS — N3000 Acute cystitis without hematuria: Secondary | ICD-10-CM | POA: Diagnosis not present

## 2020-05-25 DIAGNOSIS — R2681 Unsteadiness on feet: Secondary | ICD-10-CM | POA: Diagnosis not present

## 2020-05-25 DIAGNOSIS — R293 Abnormal posture: Secondary | ICD-10-CM | POA: Diagnosis not present

## 2020-05-25 DIAGNOSIS — R52 Pain, unspecified: Secondary | ICD-10-CM | POA: Diagnosis not present

## 2020-05-25 DIAGNOSIS — R2689 Other abnormalities of gait and mobility: Secondary | ICD-10-CM | POA: Diagnosis not present

## 2020-05-31 DIAGNOSIS — R2681 Unsteadiness on feet: Secondary | ICD-10-CM | POA: Diagnosis not present

## 2020-05-31 DIAGNOSIS — R2689 Other abnormalities of gait and mobility: Secondary | ICD-10-CM | POA: Diagnosis not present

## 2020-05-31 DIAGNOSIS — R52 Pain, unspecified: Secondary | ICD-10-CM | POA: Diagnosis not present

## 2020-05-31 DIAGNOSIS — M545 Low back pain: Secondary | ICD-10-CM | POA: Diagnosis not present

## 2020-05-31 DIAGNOSIS — R293 Abnormal posture: Secondary | ICD-10-CM | POA: Diagnosis not present

## 2020-05-31 DIAGNOSIS — N3000 Acute cystitis without hematuria: Secondary | ICD-10-CM | POA: Diagnosis not present

## 2020-06-11 ENCOUNTER — Encounter: Payer: Self-pay | Admitting: Nurse Practitioner

## 2020-06-11 ENCOUNTER — Non-Acute Institutional Stay (SKILLED_NURSING_FACILITY): Payer: Medicare Other | Admitting: Nurse Practitioner

## 2020-06-11 DIAGNOSIS — K219 Gastro-esophageal reflux disease without esophagitis: Secondary | ICD-10-CM

## 2020-06-11 DIAGNOSIS — G894 Chronic pain syndrome: Secondary | ICD-10-CM

## 2020-06-11 DIAGNOSIS — N138 Other obstructive and reflux uropathy: Secondary | ICD-10-CM

## 2020-06-11 DIAGNOSIS — F339 Major depressive disorder, recurrent, unspecified: Secondary | ICD-10-CM | POA: Diagnosis not present

## 2020-06-11 DIAGNOSIS — G4752 REM sleep behavior disorder: Secondary | ICD-10-CM | POA: Diagnosis not present

## 2020-06-11 DIAGNOSIS — M79674 Pain in right toe(s): Secondary | ICD-10-CM

## 2020-06-11 DIAGNOSIS — G250 Essential tremor: Secondary | ICD-10-CM

## 2020-06-11 DIAGNOSIS — N401 Enlarged prostate with lower urinary tract symptoms: Secondary | ICD-10-CM

## 2020-06-11 NOTE — Assessment & Plan Note (Signed)
His mood is stable, continue Wellbutrin 150mg  bid.

## 2020-06-11 NOTE — Assessment & Plan Note (Signed)
Chronic Foley, f/u Urology.

## 2020-06-11 NOTE — Assessment & Plan Note (Signed)
Chronic, Hx of lower GI bleed, continue Pantoprazole.

## 2020-06-11 NOTE — Assessment & Plan Note (Signed)
Neuropathic pain in BLE,OA pain in multiple sites, stable continue Gabapentin 300mg  bid, 100mg  qd, Tylenol 500mg  tid, Norco q6hr prn.

## 2020-06-11 NOTE — Assessment & Plan Note (Signed)
Stable, continue Propranolol, not disabling.

## 2020-06-11 NOTE — Progress Notes (Signed)
Location:   [place] Room Number: 26Room Number 26 Place of Service:  SNF (31)SNF Provider: Lennie Odor Kashmir Leedy NP  [name], MD  Patient Care Team: [name], MD as PCP - General (Internal Medicine) [name], MD (Hematology and Oncology) [name], MD as Attending Physician (Urology) [name], MD (Gastroenterology)  Extended Emergency Contact Information Primary Emergency Contact: Karder, Goodin Mobile Phone: [phone] Relation: Son Preferred language: English Interpreter needed? No Secondary Emergency Contact: Doughman,Janet H Address: [address]          [address]          [place], [place] [address] [name] of [place] Phone: [phone] Mobile Phone: [phone] Relation: Spouse  Code Status:  DNR Goals of care: Advanced Directive information Advanced Directives 06/11/2020  Does Patient Have a Medical Advance Directive? Yes  Type of Paramedic of [place];Living will  Does patient want to make changes to medical advance directive? No - Patient declined  Copy of [place] in Chart? Yes - validated most recent copy scanned in chart (See row information)  Would patient like information on creating a medical advance directive? -     Chief Complaint  Patient presents with  . Medical Management of Chronic Issues    Routine visit.    HPI:  Pt is a 86 y.o. male seen today for medical management of chronic diseases.      Hx of RLS/REM, stable, on Requip 2mg  qd.   Essential tremor, stable, on Propranolol 10mg  qd.   GERD, stable, on Pantoprazole 40mg  qd.   Neuropathic pain in BLE, stable on Gabapentin 300mg  bid, 100mg  qd, Tylenol 500mg  tid, Norco q6hr prn.   His mood is stable, on Wellbutrin 150mg  bid.  Foley chronic, f/u Urology 06/10/20.  Right 2nd toe pain, saw surgeon.   Past Medical History:  Diagnosis Date  . Anemia   . Arthritis   . AVM  (arteriovenous malformation)   . Barrett's esophagus   . BPH (benign prostatic hyperplasia)   . Cataract   . Constipation   . CPAP (continuous positive airway pressure) dependence   . Depression   . Diverticulosis    diverticular bleeding  . Diverticulosis   . GERD (gastroesophageal reflux disease)   . History of radiation therapy   . HOH (hard of hearing)    wears hearing aids  . Hyperlipidemia   . Hypoglycemia   . Lymphocytosis 06/2012  . Mastodynia   . Murmur    slight  . Nasal congestion   . Osteoporosis   . Osteoporosis   . Prostate cancer ([place])   . Sleep apnea    wears CPAP  . Urinary retention with incomplete bladder emptying    pt uses bathroom and within 30 mins needs to go again  . Vitamin D deficiency    Past Surgical History:  Procedure Laterality Date  . AMPUTATION TOE Right 09/20/2017   Procedure: Right 4th toe amputation;  Surgeon: Wylene Simmer, MD;  Location: [facility];  Service: Orthopedics;  Laterality: Right;  foot block  . AMPUTATION TOE Right 02/05/2019   Procedure: RIGHT GREAT TOE AMPUTATION;  Surgeon: Leandrew Koyanagi, MD;  Location: [facility];  Service: Orthopedics;  Laterality: Right;  . BACK SURGERY  04/2013  . back surgury     f  . BLEPHAROPLASTY Bilateral 02/23/15  . CATARACT EXTRACTION    . COLONOSCOPY    .  COLONOSCOPY N/A 10/02/2017   Procedure: COLONOSCOPY;  Surgeon: Doran Stabler, MD;  Location: [facility];  Service: Gastroenterology;  Laterality: N/A;  . COLONOSCOPY WITH PROPOFOL N/A 10/01/2017   Procedure: COLONOSCOPY WITH PROPOFOL;  Surgeon: Doran Stabler, MD;  Location: [facility];  Service: Gastroenterology;  Laterality: N/A;  . COLONOSCOPY WITH PROPOFOL N/A 03/07/2018   Procedure: COLONOSCOPY WITH PROPOFOL;  Surgeon: [name], MD;  Location: [facility];  Service: Endoscopy;  Laterality: N/A;  . ESOPHAGOGASTRODUODENOSCOPY (EGD) WITH PROPOFOL N/A 10/01/2017   Procedure:  ESOPHAGOGASTRODUODENOSCOPY (EGD) WITH PROPOFOL;  Surgeon: Doran Stabler, MD;  Location: [facility];  Service: Gastroenterology;  Laterality: N/A;  . EYE SURGERY Bilateral   . FINGER SURGERY Right    right thumb  . HARDWARE REMOVAL Right 05/15/2016   Procedure: Right Lumbar Two-Lumbar Five Removal of Hardware;  Surgeon: Kristeen Miss, MD;  Location: [facility];  Service: Neurosurgery;  Laterality: Right;  Right L2-5 removal of hardware  . INGUINAL HERNIA REPAIR     x4  . IR RADIOLOGIST EVAL & MGMT  07/10/2018  . IR RADIOLOGIST EVAL & MGMT  09/05/2018  . IR SACROPLASTY BILATERAL  07/15/2018  . REPAIR SPIGELIAN HERNIA  2011  . [place] ,2014  . TONSILLECTOMY    . UPPER GASTROINTESTINAL ENDOSCOPY    . VASECTOMY      Allergies  Allergen Reactions  . Celebrex [Celecoxib] Other (See Comments)    Created stomach ulcers.  Yvette Rack [Cyclobenzaprine] Other (See Comments)    Caught in hiatal hernia and caused extreme pain    Allergies as of 06/11/2020      Reactions   Celebrex [celecoxib] Other (See Comments)   Created stomach ulcers.   Flexeril [cyclobenzaprine] Other (See Comments)   Caught in hiatal hernia and caused extreme pain      Medication List       Accurate as of June 11, 2020 11:59 PM. If you have any questions, ask your nurse or doctor.        acetaminophen 500 MG tablet Commonly known as: TYLENOL Take 500 mg by mouth 3 (three) times daily.   buPROPion 150 MG 12 hr tablet Commonly known as: WELLBUTRIN SR Take 150 mg by mouth 2 (two) times daily.   CENTRUM ADULTS PO Take 1 tablet by mouth daily.   PRESERVISION AREDS 2 PO Take 1 capsule by mouth 2 (two) times daily.   cholecalciferol 1000 units tablet Commonly known as: VITAMIN D Take 1,000 Units by mouth daily.   diclofenac Sodium 1 % Gel Commonly known as: VOLTAREN Apply 2 g topically 4 (four) times daily.   gabapentin 300 MG capsule Commonly known as: NEURONTIN Take 300 mg by mouth 2  (two) times daily. due to numbness and tingling   gabapentin 100 MG capsule Commonly known as: NEURONTIN Take 100 mg by mouth every morning.   HYDROcodone-acetaminophen 5-325 MG tablet Commonly known as: NORCO/VICODIN Take one tablet by mouth every 6 hours as needed for moderate pain. Do not exceed 4gm of Tylenol in 24 hours.   Lactase Enzyme 3000 units tablet Generic drug: lactase Take 4,500 Units by mouth 3 (three) times daily with meals.   lovastatin 20 MG tablet Commonly known as: MEVACOR Take 20 mg by mouth at bedtime.   pantoprazole 40 MG tablet Commonly known as: PROTONIX Take 40 mg by mouth every morning.   propranolol 10 MG tablet Commonly known as: INDERAL Take 10 mg by mouth daily.  Refresh Repair 0.5-0.9 % ophthalmic solution Generic drug: carboxymethylcellul-glycerin Place 1 drop into both eyes 3 (three) times daily.   rOPINIRole 1 MG tablet Commonly known as: REQUIP TAKE 2 TABLETS(2 MG) BY MOUTH AT BEDTIME   saccharomyces boulardii 250 MG capsule Commonly known as: FLORASTOR Take 250 mg by mouth 2 (two) times daily.   zinc oxide 20 % ointment Apply 1 application topically as needed for irritation.       Review of Systems  Constitutional: Negative for activity change, fever and unexpected weight change.  HENT: Positive for hearing loss. Negative for congestion and voice change.   Eyes: Negative for visual disturbance.  Respiratory: Negative for cough, shortness of breath and wheezing.   Cardiovascular: Negative for leg swelling.  Gastrointestinal: Negative for abdominal pain and diarrhea.       Fecal incontinence  Genitourinary: Positive for difficulty urinating. Negative for dysuria.       Foley  Musculoskeletal: Positive for arthralgias, back pain, gait problem and neck pain.       The right 2nd toe pain is better  Skin: Negative for color change.  Neurological: Positive for tremors. Negative for speech difficulty, light-headedness and  headaches.       Memory lapses. Essential tremor, peripheral neuropathy, RLS  Psychiatric/Behavioral: Negative for behavioral problems and sleep disturbance. The patient is not nervous/anxious.     Immunization History  Administered Date(s) Administered  . Influenza, High Dose Seasonal PF 09/17/2019  . Influenza-Unspecified 09/16/2018  . Moderna SARS-COVID-2 Vaccination 12/08/2019, 01/05/2020  . Pneumococcal Conjugate-13 12/29/2013  . Pneumococcal Polysaccharide-23 09/08/2005  . Tdap 05/25/2005, 08/08/2018  . Zoster Recombinat (Shingrix) 10/17/2017, 01/25/2018   Pertinent  Health Maintenance Due  Topic Date Due  . FOOT EXAM  Never done  . OPHTHALMOLOGY EXAM  Never done  . URINE MICROALBUMIN  Never done  . INFLUENZA VACCINE  07/04/2020  . PNA vac Low Risk Adult  Completed  . HEMOGLOBIN A1C  Discontinued   Fall Risk  01/01/2019 09/24/2018 01/04/2017 10/06/2016 09/29/2016  Falls in the past year? 0 No No No No   Functional Status Survey:    Vitals:   06/11/20 1540  BP: 104/67  Pulse: 61  Resp: 20  Temp: (!) 97.1 F (36.2 C)  SpO2: 97%  Weight: 155 lb 12.8 oz (70.7 kg)  Height: 5\' 6"  (1.676 m)   Body mass index is 25.15 kg/m. Physical Exam Vitals and nursing note reviewed.  Constitutional:      General: He is not in acute distress.    Appearance: Normal appearance. He is not ill-appearing, toxic-appearing or diaphoretic.  HENT:     Head: Normocephalic and atraumatic.  Eyes:     Extraocular Movements: Extraocular movements intact.     Conjunctiva/sclera: Conjunctivae normal.     Pupils: Pupils are equal, round, and reactive to light.  Cardiovascular:     Rate and Rhythm: Normal rate and regular rhythm.     Heart sounds: Murmur heard.      Comments: Weak DP pulse on the right Pulmonary:     Effort: Pulmonary effort is normal.     Breath sounds: No rales.  Abdominal:     General: Bowel sounds are normal.     Palpations: Abdomen is soft.     Tenderness: There is  no abdominal tenderness.  Musculoskeletal:     Cervical back: Normal range of motion and neck supple. No rigidity or tenderness.     Right lower leg: No edema.     Left  lower leg: No edema.     Comments: The  thick callous at the tip of the right 2nd hammer toe. S/p the right great and 4th toe amputation.   Skin:    General: Skin is warm and dry.  Neurological:     General: No focal deficit present.     Mental Status: He is alert. Mental status is at baseline.     Gait: Gait abnormal.     Comments: Oriented to person, place.   Psychiatric:        Mood and Affect: Mood normal.        Behavior: Behavior normal.        Thought Content: Thought content normal.        Judgment: Judgment normal.     Labs reviewed: Recent Labs    01/15/20 0240 01/15/20 0240 01/16/20 0243 01/16/20 0243 01/17/20 0241 02/09/20 0000 05/06/20 0000  NA 135   < > 135   < > 135 138 139  K 3.9   < > 4.2   < > 3.8 4.1 4.2  CL 111   < > 112*   < > 108 105 106  CO2 16*   < > 18*   < > 20* 24* 25*  GLUCOSE 98  --  126*  --  100*  --   --   BUN 11   < > 11   < > 12 17 15   CREATININE 0.74   < > 0.70   < > 0.61 0.7 0.8  CALCIUM 7.6*   < > 7.6*   < > 7.7* 8.9 8.8  MG 1.8  --  1.8  --  1.7  --   --    < > = values in this interval not displayed.   Recent Labs    01/12/20 2110 02/09/20 0000 05/06/20 0000  AST 38 31 27  ALT 18 10 9*  ALKPHOS 76 98 91  BILITOT 0.6  --   --   PROT 6.5  --   --   ALBUMIN 3.0* 3.4* 3.5   Recent Labs    01/15/20 0240 01/15/20 0240 01/16/20 0243 01/16/20 0243 01/17/20 0241 02/09/20 0000 05/06/20 0000  WBC 18.0*   < > 16.8*   < > 11.6* 6.6 7.8  NEUTROABS 15.7*   < > 14.4*  --  9.4* 4,204  --   HGB 11.2*   < > 10.7*   < > 10.9* 13.0* 12.9*  HCT 33.9*   < > 33.2*   < > 34.0* 41 39*  MCV 91.1  --  93.0  --  91.6  --   --   PLT 256   < > 252   < > 256 272 224   < > = values in this interval not displayed.   Lab Results  Component Value Date   TSH 2.38 02/09/2020    Lab Results  Component Value Date   HGBA1C 5.2 08/29/2018   Lab Results  Component Value Date   CHOL 158 08/29/2018   HDL 34 (A) 05/06/2020   LDLCALC 103 05/06/2020   TRIG 154 05/06/2020    Significant Diagnostic Results in last 30 days:  No results found.  Assessment/Plan REM sleep behavior disorder stable, continue  Requip 2mg  qd.    Pain of toe of right foot Right 2nd toe pain has been improved,  saw surgeon.  Depression, recurrent ([place]) His mood is stable, continue Wellbutrin 150mg  bid.   Chronic pain syndrome Neuropathic  pain in BLE,OA pain in multiple sites, stable continue Gabapentin 300mg  bid, 100mg  qd, Tylenol 500mg  tid, Norco q6hr prn.    BPH (benign prostatic hyperplasia) Chronic Foley, f/u Urology.   Essential tremor Stable, continue Propranolol, not disabling.   GERD Chronic, Hx of lower GI bleed, continue Pantoprazole.    Family/ staff Communication: plan of care reviewed with the patient and charge nurse.   Labs/tests ordered:  none  Time spend 25 minutes.

## 2020-06-11 NOTE — Assessment & Plan Note (Signed)
stable, continue  Requip 2mg  qd.

## 2020-06-11 NOTE — Assessment & Plan Note (Addendum)
Right 2nd toe pain has been improved,  saw surgeon.

## 2020-06-12 ENCOUNTER — Encounter: Payer: Self-pay | Admitting: Nurse Practitioner

## 2020-07-20 DIAGNOSIS — A0472 Enterocolitis due to Clostridium difficile, not specified as recurrent: Secondary | ICD-10-CM | POA: Diagnosis not present

## 2020-07-21 ENCOUNTER — Telehealth: Payer: Self-pay | Admitting: Adult Health

## 2020-07-21 DIAGNOSIS — A0472 Enterocolitis due to Clostridium difficile, not specified as recurrent: Secondary | ICD-10-CM | POA: Diagnosis not present

## 2020-07-21 NOTE — Telephone Encounter (Signed)
[name],RN @ [facility] has called to report that pt informed her that his CPAP is still too loud. Charles [name] can be reached at [phone]

## 2020-07-21 NOTE — Telephone Encounter (Signed)
Pt last seen 12-2018.  Got new mask from [employer], pt states that too much air, noisy.  Using sporadically.  Lives at friends home [place] to come with him, (son or aide).  He is in wheelchair. Will bring machine.

## 2020-07-22 DIAGNOSIS — A0472 Enterocolitis due to Clostridium difficile, not specified as recurrent: Secondary | ICD-10-CM | POA: Diagnosis not present

## 2020-07-24 ENCOUNTER — Encounter: Payer: Self-pay | Admitting: Adult Health

## 2020-07-26 ENCOUNTER — Ambulatory Visit: Payer: Self-pay | Admitting: Adult Health

## 2020-07-26 ENCOUNTER — Telehealth: Payer: Self-pay | Admitting: *Deleted

## 2020-07-26 NOTE — Progress Notes (Deleted)
PATIENT: Charles [name] DOB: 04-Jun-1931  REASON FOR VISIT: follow up HISTORY FROM: patient  HISTORY OF PRESENT ILLNESS:  Today 07/26/20 Charles [name] is a 86 year old male patient who presents today for follow up of OSA with CPAP. He is using mask daily but does report that he has had some trouble with an ill fitting mask over the past few months. He was seen by [facility] and had a refitting about a month ago. He reports that he has felt better since refitting. Compliance reports shows he is using machine 30/30 days for compliance of 100%. He is using machine  Greater than 4 hours 29/30 days for compliance of 97%. He is using machine an average of 7 hours and 2 minutes. His AHI is 10.4 on 8 to 16 cmH2O. His leak in the 95th percentile is 23.8. His average pressure is 15.4. he does feel better. Epworth Sleepiness Scale is 7. He continues ropinirole and gabapentin for restless legs and chronic back pain.   HISTORY 06/12/18 Charles [name] is an 86 year old male with a history of obstructive sleep apnea, restless leg syndrome and chronic back pain.  He returns today for follow-up.  He remains on gabapentin for neuropathy.  He reports that he does not have any burning in the lower extremities.  He does report numbness but mainly in the right lower extremity.  Denies any significant changes with his gait or balance.  He does use a walker when ambulating.  Denies any falls.  His CPAP download indicates that he uses machine 25 out of 30 days for compliance of 83%.  He uses machine greater than 4 hours every night that he use the machine.  On average he uses his machine 8 hours and 15 minutes.  His residual AHI is 9.5 on 8 to 15 cm of water.  His leak in the 95th percentile is 23.5 L/min.  His average pressure is 14.7 cm of water.  The patient states that he does not feel the mask leaking at night he states that there are occasions that the tubing disconnect from the mask.  He denies any new neurological  symptoms.  He returns today for an evaluation.   REVIEW OF SYSTEMS: Out of a complete 14 system review of symptoms, the patient complains only of the following symptoms, restless legs, insomnia, pain, and all other reviewed systems are negative.  ALLERGIES: Allergies  Allergen Reactions  . Celebrex [Celecoxib] Other (See Comments)    Created stomach ulcers.  Marland Kitchen Flexeril [Cyclobenzaprine] Other (See Comments)    Caught in hiatal hernia and caused extreme pain    HOME MEDICATIONS: Outpatient Medications Prior to Visit  Medication Sig Dispense Refill  . acetaminophen (TYLENOL) 500 MG tablet Take 500 mg by mouth 3 (three) times daily.     Marland Kitchen buPROPion (WELLBUTRIN SR) 150 MG 12 hr tablet Take 150 mg by mouth 2 (two) times daily.     . carboxymethylcellul-glycerin (REFRESH REPAIR) 0.5-0.9 % ophthalmic solution Place 1 drop into both eyes 3 (three) times daily.    . cholecalciferol (VITAMIN D) 1000 UNITS tablet Take 1,000 Units by mouth daily.    . diclofenac Sodium (VOLTAREN) 1 % GEL Apply 2 g topically 4 (four) times daily.    Marland Kitchen gabapentin (NEURONTIN) 100 MG capsule Take 100 mg by mouth every morning.    . gabapentin (NEURONTIN) 300 MG capsule Take 300 mg by mouth 2 (two) times daily. due to numbness and tingling    . HYDROcodone-acetaminophen (  NORCO/VICODIN) 5-325 MG tablet Take one tablet by mouth every 6 hours as needed for moderate pain. Do not exceed 4gm of Tylenol in 24 hours. 120 tablet 0  . lactase (LACTASE ENZYME) 3000 units tablet Take 4,500 Units by mouth 3 (three) times daily with meals.     . lovastatin (MEVACOR) 20 MG tablet Take 20 mg by mouth at bedtime.     . Multiple Vitamins-Minerals (CENTRUM ADULTS PO) Take 1 tablet by mouth daily.    . Multiple Vitamins-Minerals (PRESERVISION AREDS 2 PO) Take 1 capsule by mouth 2 (two) times daily.    . pantoprazole (PROTONIX) 40 MG tablet Take 40 mg by mouth every morning.     . propranolol (INDERAL) 10 MG tablet Take 10 mg by mouth  daily.     Marland Kitchen rOPINIRole (REQUIP) 1 MG tablet TAKE 2 TABLETS(2 MG) BY MOUTH AT BEDTIME 180 tablet 2  . saccharomyces boulardii (FLORASTOR) 250 MG capsule Take 250 mg by mouth 2 (two) times daily.    Marland Kitchen zinc oxide 20 % ointment Apply 1 application topically as needed for irritation.     No facility-administered medications prior to visit.    PAST MEDICAL HISTORY: Past Medical History:  Diagnosis Date  . Anemia   . Arthritis   . AVM (arteriovenous malformation)   . Barrett's esophagus   . BPH (benign prostatic hyperplasia)   . Cataract   . Constipation   . CPAP (continuous positive airway pressure) dependence   . Depression   . Diverticulosis    diverticular bleeding  . Diverticulosis   . GERD (gastroesophageal reflux disease)   . History of radiation therapy   . HOH (hard of hearing)    wears hearing aids  . Hyperlipidemia   . Hypoglycemia   . Lymphocytosis 06/2012  . Mastodynia   . Murmur    slight  . Nasal congestion   . Osteoporosis   . Osteoporosis   . Prostate cancer ([place])   . Sleep apnea    wears CPAP  . Urinary retention with incomplete bladder emptying    pt uses bathroom and within 30 mins needs to go again  . Vitamin D deficiency     PAST SURGICAL HISTORY: Past Surgical History:  Procedure Laterality Date  . AMPUTATION TOE Right 09/20/2017   Procedure: Right 4th toe amputation;  Surgeon: Wylene Simmer, MD;  Location: [facility];  Service: Orthopedics;  Laterality: Right;  foot block  . AMPUTATION TOE Right 02/05/2019   Procedure: RIGHT GREAT TOE AMPUTATION;  Surgeon: Leandrew Koyanagi, MD;  Location: [facility];  Service: Orthopedics;  Laterality: Right;  . BACK SURGERY  04/2013  . back surgury     f  . BLEPHAROPLASTY Bilateral 02/23/15  . CATARACT EXTRACTION    . COLONOSCOPY    . COLONOSCOPY N/A 10/02/2017   Procedure: COLONOSCOPY;  Surgeon: Doran Stabler, MD;  Location: [facility];  Service: Gastroenterology;  Laterality:  N/A;  . COLONOSCOPY WITH PROPOFOL N/A 10/01/2017   Procedure: COLONOSCOPY WITH PROPOFOL;  Surgeon: Doran Stabler, MD;  Location: [facility];  Service: Gastroenterology;  Laterality: N/A;  . COLONOSCOPY WITH PROPOFOL N/A 03/07/2018   Procedure: COLONOSCOPY WITH PROPOFOL;  Surgeon: [name], MD;  Location: [facility];  Service: Endoscopy;  Laterality: N/A;  . ESOPHAGOGASTRODUODENOSCOPY (EGD) WITH PROPOFOL N/A 10/01/2017   Procedure: ESOPHAGOGASTRODUODENOSCOPY (EGD) WITH PROPOFOL;  Surgeon: Doran Stabler, MD;  Location: [facility];  Service: Gastroenterology;  Laterality: N/A;  .  EYE SURGERY Bilateral   . FINGER SURGERY Right    right thumb  . HARDWARE REMOVAL Right 05/15/2016   Procedure: Right Lumbar Two-Lumbar Five Removal of Hardware;  Surgeon: Kristeen Miss, MD;  Location: [facility];  Service: Neurosurgery;  Laterality: Right;  Right L2-5 removal of hardware  . INGUINAL HERNIA REPAIR     x4  . IR RADIOLOGIST EVAL & MGMT  07/10/2018  . IR RADIOLOGIST EVAL & MGMT  09/05/2018  . IR SACROPLASTY BILATERAL  07/15/2018  . REPAIR SPIGELIAN HERNIA  2011  . [place] ,2014  . TONSILLECTOMY    . UPPER GASTROINTESTINAL ENDOSCOPY    . VASECTOMY      FAMILY HISTORY: Family History  Problem Relation Age of Onset  . Prostate cancer Father   . CAD Mother   . Arthritis-Osteo Brother   . Colon cancer Neg Hx   . Stomach cancer Neg Hx     SOCIAL HISTORY: Social History   Socioeconomic History  . Marital status: Married    Spouse name: [name]  . Number of children: 3  . Years of education: College  . Highest education level: Not on file  Occupational History  . 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 Smoking status: Never Smoker  . Smokeless tobacco: Never Used  Vaping Use  . Vaping Use: Never used  Substance and Sexual Activity  . Alcohol use: No  . Drug use: No  . Sexual activity: Not Currently  Other  Topics Concern  . Not on file  Social History Narrative   Patient is married [name]).   Patient is a retired Arboriculturist.   Patient lives in [facility] living facility.   Patient has three adult children.   Patient does not drink any caffeine.   Patient is right-handed.   Social Determinants of Health   Financial Resource Strain:   . Difficulty of Paying Living Expenses: Not on file  Food Insecurity:   . Worried About Charity fundraiser in the Last Year: Not on file  . Ran Out of Food in the Last Year: Not on file  Transportation Needs:   . Lack of Transportation (Medical): Not on file  . Lack of Transportation (Non-Medical): Not on file  Physical Activity:   . Days of Exercise per Week: Not on file  . Minutes of Exercise per Session: Not on file  Stress:   . Feeling of Stress : Not on file  Social Connections:   . Frequency of Communication with Friends and Family: Not on file  . Frequency of Social Gatherings with Friends and Family: Not on file  . Attends Religious Services: Not on file  . Active Member of Clubs or Organizations: Not on file  . Attends Archivist Meetings: Not on file  . Marital Status: Not on file  Intimate Partner Violence:   . Fear of Current or Ex-Partner: Not on file  . Emotionally Abused: Not on file  . Physically Abused: Not on file  . Sexually Abused: Not on file      PHYSICAL EXAM  There were no vitals filed for this visit. There is no height or weight on file to calculate BMI.  Generalized: Well developed, in no acute distress   Neurological examination  Mentation: Alert oriented to time, place, history taking. Follows all commands speech and language fluent Cranial nerve II-XII: Pupils were equal round reactive to light. Extraocular  movements were full, visual field were full on confrontational test. Facial sensation and strength were normal. Uvula tongue midline. Head turning and shoulder shrug  were normal and  symmetric. Motor: The motor testing reveals 5 over 5 strength of all 4 extremities. Good symmetric motor tone is noted throughout.  Sensory: Sensory testing is intact to soft touch on all 4 extremities. No evidence of extinction is noted.  Coordination: Cerebellar testing reveals good finger-nose-finger and heel-to-shin bilaterally.  Gait and station: Gait is normal. Tandem gait is normal. Romberg is negative. No drift is seen.  Reflexes: Deep tendon reflexes are symmetric and normal bilaterally.   DIAGNOSTIC DATA (LABS, IMAGING, TESTING) - I reviewed patient records, labs, notes, testing and imaging myself where available.  Lab Results  Component Value Date   WBC 7.8 05/06/2020   HGB 12.9 (A) 05/06/2020   HCT 39 (A) 05/06/2020   MCV 91.6 01/17/2020   PLT 224 05/06/2020      Component Value Date/Time   NA 139 05/06/2020 0000   NA 137 08/06/2018 0000   K 4.2 05/06/2020 0000   K 4.0 08/06/2018 0000   CL 106 05/06/2020 0000   CL 104 05/30/2018 0000   CO2 25 (A) 05/06/2020 0000   CO2 26 05/30/2018 0000   GLUCOSE 100 (H) 01/17/2020 0241   BUN 15 05/06/2020 0000   CREATININE 0.8 05/06/2020 0000   CREATININE 0.61 01/17/2020 0241   CREATININE 0.69 08/06/2018 0000   CALCIUM 8.8 05/06/2020 0000   CALCIUM 8.2 08/06/2018 0000   PROT 6.5 01/12/2020 2110   PROT 5.3 08/06/2018 0000   PROT 6.2 05/30/2018 0000   ALBUMIN 3.5 05/06/2020 0000   ALBUMIN 2.8 08/06/2018 0000   AST 27 05/06/2020 0000   AST 27 08/06/2018 0000   ALT 9 (A) 05/06/2020 0000   ALT 11 08/06/2018 0000   ALT 67 07/08/2018 0000   ALKPHOS 91 05/06/2020 0000   ALKPHOS 90 08/06/2018 0000   BILITOT 0.6 01/12/2020 2110   BILITOT 0.3 08/06/2018 0000   GFRNONAA 83 05/06/2020 0000   GFRNONAA 89 07/08/2018 0000   GFRAA 96 05/06/2020 0000   Lab Results  Component Value Date   CHOL 158 08/29/2018   HDL 34 (A) 05/06/2020   LDLCALC 103 05/06/2020   TRIG 154 05/06/2020   Lab Results  Component Value Date   HGBA1C 5.2  08/29/2018   Lab Results  Component Value Date   VITAMINB12 352 02/09/2020   Lab Results  Component Value Date   TSH 2.38 02/09/2020      ASSESSMENT AND PLAN 86 y.o. year old male  has a past medical history of Anemia, Arthritis, AVM (arteriovenous malformation), Barrett's esophagus, BPH (benign prostatic hyperplasia), Cataract, Constipation, CPAP (continuous positive airway pressure) dependence, Depression, Diverticulosis, Diverticulosis, GERD (gastroesophageal reflux disease), History of radiation therapy, HOH (hard of hearing), Hyperlipidemia, Hypoglycemia, Lymphocytosis (06/2012), Mastodynia, Murmur, Nasal congestion, Osteoporosis, Osteoporosis, Prostate cancer ([place]), Sleep apnea, Urinary retention with incomplete bladder emptying, and Vitamin D deficiency. here with   1. OSA on CPAP  2. Restless legs  He is doing well. He feels that sleep has improved. We will adjust pressures to 8-20cmH2O. I have encouraged him to continue using CPAP nightly and greater than 4 hours each night. Continue gabapentin and ropinirole as prescribed for restless legs and pain. We will follow up in 6 months.    I spent *** minutes of face-to-face and non-face-to-face time with patient.  This included previsit chart review, lab review, study review, order  entry, electronic health record documentation, patient education  Frann Rider, [facility]  [facility] [address] [place], [place] [address]  Phone [phone] Fax [phone] Note: This document was prepared with digital dictation and possible smart phrase technology. Any transcriptional errors that result from this process are unintentional.

## 2020-07-26 NOTE — Telephone Encounter (Signed)
I called and LMVM for [name], re: pts appt, needing to cancel, per NP request as pt has not used cpap machine and would need to see DME for mask/machine issue.  I called and spoke to [facility] in security I explained trying to get transportation to cancel appt for pt as per above.  I called and LMVM for CS at [facility]/ and then RT as well.

## 2020-07-27 DIAGNOSIS — A0472 Enterocolitis due to Clostridium difficile, not specified as recurrent: Secondary | ICD-10-CM | POA: Diagnosis not present

## 2020-07-27 NOTE — Telephone Encounter (Addendum)
I called [name] with [facility],  Made VV for him 07-28-20 at 1030 with MM/NP.  jenie to be present.  MYchart appt made. Amado Coe to see about password for pt. Will call back if needed. Changed per Amado Coe and was able to log in to his mychart account. This is to re establish pt on cpap has he is eligible for new machine since 2016. Using [facility] now for supplies.

## 2020-07-27 NOTE — Telephone Encounter (Signed)
Spoke with Wells Guiles at [employer] at [employer].   Pt bought machine in 2016.  (eligible now for new one).  Had initial SS by Dr. Philip Aspen, and ordered the cpap at [facility].  [employer] Serial # resmed [id number].  Most recently getting supplies from [facility].  They cannot do anything with machine.  This would need to go to [facility].  (only 2 yr warranty on machine) pt would have to pay out of pocket now if wants to repair,  This would need appt at [facility].  Do you want to order new machine for pt?

## 2020-07-28 ENCOUNTER — Telehealth (INDEPENDENT_AMBULATORY_CARE_PROVIDER_SITE_OTHER): Payer: Medicare Other | Admitting: Adult Health

## 2020-07-28 ENCOUNTER — Ambulatory Visit: Payer: Self-pay | Admitting: Family Medicine

## 2020-07-28 DIAGNOSIS — A0472 Enterocolitis due to Clostridium difficile, not specified as recurrent: Secondary | ICD-10-CM | POA: Diagnosis not present

## 2020-07-28 DIAGNOSIS — G4733 Obstructive sleep apnea (adult) (pediatric): Secondary | ICD-10-CM

## 2020-07-28 DIAGNOSIS — Z9989 Dependence on other enabling machines and devices: Secondary | ICD-10-CM

## 2020-07-28 NOTE — Progress Notes (Signed)
PATIENT: Charles [name] DOB: [birth year]  REASON FOR VISIT: follow up HISTORY FROM: patient  Virtual Visit via Video Note  I connected with Julianne Rice on 07/28/20 at 10:30 AM EDT by a video enabled telemedicine application located remotely at [facility] and verified that I am speaking with the correct person using two identifiers who was located at their own home.   I discussed the limitations of evaluation and management by telemedicine and the availability of in person appointments. The patient expressed understanding and agreed to proceed.   PATIENT: Charles [name] DOB: 12-09-1930  REASON FOR VISIT: follow up HISTORY FROM: patient  HISTORY OF PRESENT ILLNESS: Today 07/28/20  HISTORY   REVIEW OF SYSTEMS: Out of a complete 14 system review of symptoms, the patient complains only of the following symptoms, and all other reviewed systems are negative.  ALLERGIES: Allergies  Allergen Reactions  . Celebrex [Celecoxib] Other (See Comments)    Created stomach ulcers.  Marland Kitchen Flexeril [Cyclobenzaprine] Other (See Comments)    Caught in hiatal hernia and caused extreme pain    HOME MEDICATIONS: Outpatient Medications Prior to Visit  Medication Sig Dispense Refill  . acetaminophen (TYLENOL) 500 MG tablet Take 500 mg by mouth 3 (three) times daily.     Marland Kitchen buPROPion (WELLBUTRIN SR) 150 MG 12 hr tablet Take 150 mg by mouth 2 (two) times daily.     . carboxymethylcellul-glycerin (REFRESH REPAIR) 0.5-0.9 % ophthalmic solution Place 1 drop into both eyes 3 (three) times daily.    . cholecalciferol (VITAMIN D) 1000 UNITS tablet Take 1,000 Units by mouth daily.    . diclofenac Sodium (VOLTAREN) 1 % GEL Apply 2 g topically 4 (four) times daily.    Marland Kitchen gabapentin (NEURONTIN) 100 MG capsule Take 100 mg by mouth every morning.    . gabapentin (NEURONTIN) 300 MG capsule Take 300 mg by mouth 2 (two) times daily. due to numbness and tingling    .  HYDROcodone-acetaminophen (NORCO/VICODIN) 5-325 MG tablet Take one tablet by mouth every 6 hours as needed for moderate pain. Do not exceed 4gm of Tylenol in 24 hours. 120 tablet 0  . lactase (LACTASE ENZYME) 3000 units tablet Take 4,500 Units by mouth 3 (three) times daily with meals.     . lovastatin (MEVACOR) 20 MG tablet Take 20 mg by mouth at bedtime.     . Multiple Vitamins-Minerals (CENTRUM ADULTS PO) Take 1 tablet by mouth daily.    . Multiple Vitamins-Minerals (PRESERVISION AREDS 2 PO) Take 1 capsule by mouth 2 (two) times daily.    . pantoprazole (PROTONIX) 40 MG tablet Take 40 mg by mouth every morning.     . propranolol (INDERAL) 10 MG tablet Take 10 mg by mouth daily.     Marland Kitchen rOPINIRole (REQUIP) 1 MG tablet TAKE 2 TABLETS(2 MG) BY MOUTH AT BEDTIME 180 tablet 2  . saccharomyces boulardii (FLORASTOR) 250 MG capsule Take 250 mg by mouth 2 (two) times daily.    Marland Kitchen zinc oxide 20 % ointment Apply 1 application topically as needed for irritation.     No facility-administered medications prior to visit.    PAST MEDICAL HISTORY: Past Medical History:  Diagnosis Date  . Anemia   . Arthritis   . AVM (arteriovenous malformation)   . Barrett's esophagus   . BPH (benign prostatic hyperplasia)   . Cataract   . Constipation   . CPAP (continuous positive airway pressure) dependence   . Depression   .  Diverticulosis    diverticular bleeding  . Diverticulosis   . GERD (gastroesophageal reflux disease)   . History of radiation therapy   . HOH (hard of hearing)    wears hearing aids  . Hyperlipidemia   . Hypoglycemia   . Lymphocytosis 06/2012  . Mastodynia   . Murmur    slight  . Nasal congestion   . Osteoporosis   . Osteoporosis   . Prostate cancer ([place])   . Sleep apnea    wears CPAP  . Urinary retention with incomplete bladder emptying    pt uses bathroom and within 30 mins needs to go again  . Vitamin D deficiency     PAST SURGICAL HISTORY: Past Surgical History:  Procedure  Laterality Date  . AMPUTATION TOE Right 09/20/2017   Procedure: Right 4th toe amputation;  Surgeon: Wylene Simmer, MD;  Location: [place];  Service: Orthopedics;  Laterality: Right;  foot block  . AMPUTATION TOE Right 02/05/2019   Procedure: RIGHT GREAT TOE AMPUTATION;  Surgeon: Leandrew Koyanagi, MD;  Location: [place];  Service: Orthopedics;  Laterality: Right;  . BACK SURGERY  04/2013  . back surgury     f  . BLEPHAROPLASTY Bilateral 02/23/15  . CATARACT EXTRACTION    . COLONOSCOPY    . COLONOSCOPY N/A 10/02/2017   Procedure: COLONOSCOPY;  Surgeon: Doran Stabler, MD;  [facility];  Service: Gastroenterology;  Laterality: N/A;  . COLONOSCOPY WITH PROPOFOL N/A 10/01/2017   Procedure: COLONOSCOPY WITH PROPOFOL;  Surgeon: Doran Stabler, MD;  [facility];  Service: Gastroenterology;  Laterality: N/A;  . COLONOSCOPY WITH PROPOFOL N/A 03/07/2018   Procedure: COLONOSCOPY WITH PROPOFOL;  Surgeon: [name], MD;  [facility];  Service: Endoscopy;  Laterality: N/A;  . ESOPHAGOGASTRODUODENOSCOPY (EGD) WITH PROPOFOL N/A 10/01/2017   Procedure: ESOPHAGOGASTRODUODENOSCOPY (EGD) WITH PROPOFOL;  Surgeon: Doran Stabler, MD;  [facility];  Service: Gastroenterology;  Laterality: N/A;  . EYE SURGERY Bilateral   . FINGER SURGERY Right    right thumb  . HARDWARE REMOVAL Right 05/15/2016   Procedure: Right Lumbar Two-Lumbar Five Removal of Hardware;  Surgeon: Kristeen Miss, MD;  Location: [place] [facility];  Service: Neurosurgery;  Laterality: Right;  Right L2-5 removal of hardware  . INGUINAL HERNIA REPAIR     x4  . IR RADIOLOGIST EVAL & MGMT  07/10/2018  . IR RADIOLOGIST EVAL & MGMT  09/05/2018  . IR SACROPLASTY BILATERAL  07/15/2018  . REPAIR SPIGELIAN HERNIA  2011  . [place] ,2014  . TONSILLECTOMY    . UPPER GASTROINTESTINAL ENDOSCOPY    . VASECTOMY      FAMILY HISTORY: Family History  Problem Relation  Age of Onset  . Prostate cancer Father   . CAD Mother   . Arthritis-Osteo Brother   . Colon cancer Neg Hx   . Stomach cancer Neg Hx     SOCIAL HISTORY: Social History   Socioeconomic History  . Marital status: Married    Spouse name: [name]  . Number of children: 3  . Years of education: College  . Highest education level: Not on file  Occupational History  . 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 Smoking status: Never Smoker  . Smokeless tobacco: Never Used  Vaping Use  . Vaping Use: Never used  Substance and Sexual Activity  . Alcohol use: No  .  Drug use: No  . Sexual activity: Not Currently  Other Topics Concern  . Not on file  Social History Narrative   Patient is married [name]).   Patient is a retired Arboriculturist.   Patient lives in [facility] living facility.   Patient has three adult children.   Patient does not drink any caffeine.   Patient is right-handed.   Social Determinants of Health   Financial Resource Strain:   . Difficulty of Paying Living Expenses: Not on file  Food Insecurity:   . Worried About Charity fundraiser in the Last Year: Not on file  . Ran Out of Food in the Last Year: Not on file  Transportation Needs:   . Lack of Transportation (Medical): Not on file  . Lack of Transportation (Non-Medical): Not on file  Physical Activity:   . Days of Exercise per Week: Not on file  . Minutes of Exercise per Session: Not on file  Stress:   . Feeling of Stress : Not on file  Social Connections:   . Frequency of Communication with Friends and Family: Not on file  . Frequency of Social Gatherings with Friends and Family: Not on file  . Attends Religious Services: Not on file  . Active Member of Clubs or Organizations: Not on file  . Attends Archivist Meetings: Not on file  . Marital Status: Not on file  Intimate Partner Violence:   . Fear of Current or Ex-Partner: Not on file  .  Emotionally Abused: Not on file  . Physically Abused: Not on file  . Sexually Abused: Not on file      PHYSICAL EXAM Generalized: Well developed, in no acute distress   Neurological examination  Mentation: Alert oriented to time, place, history taking. Follows all commands speech and language fluent Cranial nerve II-XII:Extraocular movements were full. Facial symmetry noted. uvula tongue midline. Head turning and shoulder shrug  were normal and symmetric. Motor: Good strength throughout subjectively per patient Sensory: Sensory testing is intact to soft touch on all 4 extremities subjectively per patient Coordination: Cerebellar testing reveals good finger-nose-finger  Gait and station: Patient is able to stand from a seated position. gait is normal.  Reflexes: UTA  DIAGNOSTIC DATA (LABS, IMAGING, TESTING) - I reviewed patient records, labs, notes, testing and imaging myself where available.  Lab Results  Component Value Date   WBC 7.8 05/06/2020   HGB 12.9 (A) 05/06/2020   HCT 39 (A) 05/06/2020   MCV 91.6 01/17/2020   PLT 224 05/06/2020      Component Value Date/Time   NA 139 05/06/2020 0000   NA 137 08/06/2018 0000   K 4.2 05/06/2020 0000   K 4.0 08/06/2018 0000   CL 106 05/06/2020 0000   CL 104 05/30/2018 0000   CO2 25 (A) 05/06/2020 0000   CO2 26 05/30/2018 0000   GLUCOSE 100 (H) 01/17/2020 0241   BUN 15 05/06/2020 0000   CREATININE 0.8 05/06/2020 0000   CREATININE 0.61 01/17/2020 0241   CREATININE 0.69 08/06/2018 0000   CALCIUM 8.8 05/06/2020 0000   CALCIUM 8.2 08/06/2018 0000   PROT 6.5 01/12/2020 2110   PROT 5.3 08/06/2018 0000   PROT 6.2 05/30/2018 0000   ALBUMIN 3.5 05/06/2020 0000   ALBUMIN 2.8 08/06/2018 0000   AST 27 05/06/2020 0000   AST 27 08/06/2018 0000   ALT 9 (A) 05/06/2020 0000   ALT 11 08/06/2018 0000   ALT 67 07/08/2018 0000   ALKPHOS 91 05/06/2020 0000  ALKPHOS 90 08/06/2018 0000   BILITOT 0.6 01/12/2020 2110   BILITOT 0.3 08/06/2018  0000   GFRNONAA 83 05/06/2020 0000   GFRNONAA 89 07/08/2018 0000   GFRAA 96 05/06/2020 0000   Lab Results  Component Value Date   CHOL 158 08/29/2018   HDL 34 (A) 05/06/2020   LDLCALC 103 05/06/2020   TRIG 154 05/06/2020   Lab Results  Component Value Date   HGBA1C 5.2 08/29/2018   Lab Results  Component Value Date   VITAMINB12 352 02/09/2020   Lab Results  Component Value Date   TSH 2.38 02/09/2020      ASSESSMENT AND PLAN 86 y.o. year old male  has a past medical history of Anemia, Arthritis, AVM (arteriovenous malformation), Barrett's esophagus, BPH (benign prostatic hyperplasia), Cataract, Constipation, CPAP (continuous positive airway pressure) dependence, Depression, Diverticulosis, Diverticulosis, GERD (gastroesophageal reflux disease), History of radiation therapy, HOH (hard of hearing), Hyperlipidemia, Hypoglycemia, Lymphocytosis (06/2012), Mastodynia, Murmur, Nasal congestion, Osteoporosis, Osteoporosis, Prostate cancer ([place]), Sleep apnea, Urinary retention with incomplete bladder emptying, and Vitamin D deficiency. here with:  OSA on CPAP  . Restart CPAP . New machine ordered-will change pressure 8 to 17 cm of water . Encouraged patient to continue using CPAP nightly and > 4 hours each night . F/U after starting new machine in recommended timeframe  I spent 20 minutes of face-to-face and non-face-to-face time with patient.  This included previsit chart review, lab review, study review, order entry, electronic health record documentation, patient education.  Ward Givens, MSN, NP-C 07/28/2020, 10:41 AM [facility] [address] [place], [place] [address] [phone]

## 2020-07-30 DIAGNOSIS — A0472 Enterocolitis due to Clostridium difficile, not specified as recurrent: Secondary | ICD-10-CM | POA: Diagnosis not present

## 2020-08-02 NOTE — Progress Notes (Signed)
Message sent to [facility] for new machine and pressure changes.  Charles [name]

## 2020-08-02 NOTE — Progress Notes (Signed)
Towanda Octave, RN Got it! Thank you.

## 2020-08-03 DIAGNOSIS — A0472 Enterocolitis due to Clostridium difficile, not specified as recurrent: Secondary | ICD-10-CM | POA: Diagnosis not present

## 2020-08-04 DIAGNOSIS — R2681 Unsteadiness on feet: Secondary | ICD-10-CM | POA: Diagnosis not present

## 2020-08-04 DIAGNOSIS — R293 Abnormal posture: Secondary | ICD-10-CM | POA: Diagnosis not present

## 2020-08-04 DIAGNOSIS — A0472 Enterocolitis due to Clostridium difficile, not specified as recurrent: Secondary | ICD-10-CM | POA: Diagnosis not present

## 2020-08-04 DIAGNOSIS — R2689 Other abnormalities of gait and mobility: Secondary | ICD-10-CM | POA: Diagnosis not present

## 2020-08-06 ENCOUNTER — Encounter: Payer: Self-pay | Admitting: Nurse Practitioner

## 2020-08-06 ENCOUNTER — Non-Acute Institutional Stay (SKILLED_NURSING_FACILITY): Payer: Medicare Other | Admitting: Nurse Practitioner

## 2020-08-06 DIAGNOSIS — M5442 Lumbago with sciatica, left side: Secondary | ICD-10-CM

## 2020-08-06 DIAGNOSIS — R2689 Other abnormalities of gait and mobility: Secondary | ICD-10-CM | POA: Diagnosis not present

## 2020-08-06 DIAGNOSIS — M5441 Lumbago with sciatica, right side: Secondary | ICD-10-CM

## 2020-08-06 DIAGNOSIS — K219 Gastro-esophageal reflux disease without esophagitis: Secondary | ICD-10-CM

## 2020-08-06 DIAGNOSIS — Z7189 Other specified counseling: Secondary | ICD-10-CM | POA: Diagnosis not present

## 2020-08-06 DIAGNOSIS — R339 Retention of urine, unspecified: Secondary | ICD-10-CM | POA: Diagnosis not present

## 2020-08-06 DIAGNOSIS — M79674 Pain in right toe(s): Secondary | ICD-10-CM | POA: Diagnosis not present

## 2020-08-06 DIAGNOSIS — A0472 Enterocolitis due to Clostridium difficile, not specified as recurrent: Secondary | ICD-10-CM | POA: Diagnosis not present

## 2020-08-06 DIAGNOSIS — G4752 REM sleep behavior disorder: Secondary | ICD-10-CM

## 2020-08-06 DIAGNOSIS — G250 Essential tremor: Secondary | ICD-10-CM

## 2020-08-06 DIAGNOSIS — G8929 Other chronic pain: Secondary | ICD-10-CM

## 2020-08-06 DIAGNOSIS — R2681 Unsteadiness on feet: Secondary | ICD-10-CM | POA: Diagnosis not present

## 2020-08-06 DIAGNOSIS — F339 Major depressive disorder, recurrent, unspecified: Secondary | ICD-10-CM

## 2020-08-06 DIAGNOSIS — R293 Abnormal posture: Secondary | ICD-10-CM | POA: Diagnosis not present

## 2020-08-06 NOTE — Progress Notes (Signed)
Location:   SNF [place] Room Number: 17 Place of Service:  SNF (31) Provider:  Nissa Stannard Bretta Bang NP  [name], MD  Patient Care Team: [name], MD as PCP - General (Internal Medicine) [name], MD (Hematology and Oncology) [name], MD as Attending Physician (Urology) [name], MD (Gastroenterology)  Extended Emergency Contact Information Primary Emergency Contact: Trase, Bunda Mobile Phone: [phone] Relation: Son Preferred language: English Interpreter needed? No Secondary Emergency Contact: Kirchgessner,Janet H Address: [address]          [address]          [place], [place] [address] [name] of [place] Phone: [phone] Mobile Phone: [phone] Relation: Spouse  Code Status:  Full Code Goals of care: Advanced Directive information Advanced Directives 08/06/2020  Does Patient Have a Medical Advance Directive? Yes  Type of Paramedic of [place];Living will  Does patient want to make changes to medical advance directive? No - Patient declined  Copy of [place] in Chart? Yes - validated most recent copy scanned in chart (See row information)  Would patient like information on creating a medical advance directive? -     Chief Complaint  Patient presents with  . Acute Visit    Plan of care    HPI:  Pt is a 86 y.o. male seen today for an acute visit for advanced plan of care discussion requested by social worker and the patient.   Hx ofRLS/REM, stable, on Requip 2mg  qd.              Essential tremor, stable, on Propranolol 10mg  qd.              GERD, stable, on Pantoprazole 40mg  qd.              Neuropathic pain in BLE, stable on Gabapentin 300mg  bid, 100mg  qd, Tylenol 500mg  tid, Norco q6hr prn.             His mood is stable, on Wellbutrin 150mg  bid.             Foley chronic, f/u Urology 06/10/20.         Right 2nd toe pain, saw surgeon.  Past Medical  History:  Diagnosis Date  . Anemia   . Arthritis   . AVM (arteriovenous malformation)   . Barrett's esophagus   . BPH (benign prostatic hyperplasia)   . Cataract   . Constipation   . CPAP (continuous positive airway pressure) dependence   . Depression   . Diverticulosis    diverticular bleeding  . Diverticulosis   . GERD (gastroesophageal reflux disease)   . History of radiation therapy   . HOH (hard of hearing)    wears hearing aids  . Hyperlipidemia   . Hypoglycemia   . Lymphocytosis 06/2012  . Mastodynia   . Murmur    slight  . Nasal congestion   . Osteoporosis   . Osteoporosis   . Prostate cancer ([place])   . Sleep apnea    wears CPAP  . Urinary retention with incomplete bladder emptying    pt uses bathroom and within 30 mins needs to go again  . Vitamin D deficiency    Past Surgical History:  Procedure Laterality Date  . AMPUTATION TOE Right 09/20/2017   Procedure: Right 4th toe amputation;  Surgeon: Wylene Simmer, MD;  Location: [place];  Service: Orthopedics;  Laterality: Right;  foot  block  . AMPUTATION TOE Right 02/05/2019   Procedure: RIGHT GREAT TOE AMPUTATION;  Surgeon: Leandrew Koyanagi, MD;  Location: [place];  Service: Orthopedics;  Laterality: Right;  . BACK SURGERY  04/2013  . back surgury     f  . BLEPHAROPLASTY Bilateral 02/23/15  . CATARACT EXTRACTION    . COLONOSCOPY    . COLONOSCOPY N/A 10/02/2017   Procedure: COLONOSCOPY;  Surgeon: Doran Stabler, MD;  Location: [facility];  Service: Gastroenterology;  Laterality: N/A;  . COLONOSCOPY WITH PROPOFOL N/A 10/01/2017   Procedure: COLONOSCOPY WITH PROPOFOL;  Surgeon: Doran Stabler, MD;  [facility];  Service: Gastroenterology;  Laterality: N/A;  . COLONOSCOPY WITH PROPOFOL N/A 03/07/2018   Procedure: COLONOSCOPY WITH PROPOFOL;  Surgeon: [name], MD;  [facility];  Service: Endoscopy;  Laterality: N/A;  . ESOPHAGOGASTRODUODENOSCOPY  (EGD) WITH PROPOFOL N/A 10/01/2017   Procedure: ESOPHAGOGASTRODUODENOSCOPY (EGD) WITH PROPOFOL;  Surgeon: Doran Stabler, MD;  [facility];  Service: Gastroenterology;  Laterality: N/A;  . EYE SURGERY Bilateral   . FINGER SURGERY Right    right thumb  . HARDWARE REMOVAL Right 05/15/2016   Procedure: Right Lumbar Two-Lumbar Five Removal of Hardware;  Surgeon: Kristeen Miss, MD;  Location: [place] [facility];  Service: Neurosurgery;  Laterality: Right;  Right L2-5 removal of hardware  . INGUINAL HERNIA REPAIR     x4  . IR RADIOLOGIST EVAL & MGMT  07/10/2018  . IR RADIOLOGIST EVAL & MGMT  09/05/2018  . IR SACROPLASTY BILATERAL  07/15/2018  . REPAIR SPIGELIAN HERNIA  2011  . [place] ,2014  . TONSILLECTOMY    . UPPER GASTROINTESTINAL ENDOSCOPY    . VASECTOMY      Allergies  Allergen Reactions  . Celebrex [Celecoxib] Other (See Comments)    Created stomach ulcers.  Yvette Rack [Cyclobenzaprine] Other (See Comments)    Caught in hiatal hernia and caused extreme pain    Allergies as of 08/06/2020      Reactions   Celebrex [celecoxib] Other (See Comments)   Created stomach ulcers.   Flexeril [cyclobenzaprine] Other (See Comments)   Caught in hiatal hernia and caused extreme pain      Medication List       Accurate as of August 06, 2020 11:59 PM. If you have any questions, ask your nurse or doctor.        STOP taking these medications   saccharomyces boulardii 250 MG capsule Commonly known as: FLORASTOR Stopped by: Mikita Lesmeister X Susana Duell, NP     TAKE these medications   acetaminophen 500 MG tablet Commonly known as: TYLENOL Take 500 mg by mouth 3 (three) times daily.   buPROPion 150 MG 12 hr tablet Commonly known as: WELLBUTRIN SR Take 150 mg by mouth 2 (two) times daily.   CENTRUM ADULTS PO Take 1 tablet by mouth daily.   PRESERVISION AREDS 2 PO Take 1 capsule by mouth 2 (two) times daily.   cholecalciferol 1000 units tablet Commonly known as: VITAMIN D Take  1,000 Units by mouth daily.   diclofenac Sodium 1 % Gel Commonly known as: VOLTAREN Apply 2 g topically 4 (four) times daily.   gabapentin 300 MG capsule Commonly known as: NEURONTIN Take 300 mg by mouth 2 (two) times daily. due to numbness and tingling   gabapentin 100 MG capsule Commonly known as: NEURONTIN Take 100 mg by mouth every morning.   HYDROcodone-acetaminophen 5-325 MG tablet Commonly known  as: NORCO/VICODIN Take one tablet by mouth every 6 hours as needed for moderate pain. Do not exceed 4gm of Tylenol in 24 hours.   Lactase Enzyme 3000 units tablet Generic drug: lactase Take 4,500 Units by mouth 3 (three) times daily with meals.   lovastatin 20 MG tablet Commonly known as: MEVACOR Take 20 mg by mouth at bedtime.   pantoprazole 40 MG tablet Commonly known as: PROTONIX Take 40 mg by mouth every morning.   propranolol 10 MG tablet Commonly known as: INDERAL Take 10 mg by mouth daily.   Refresh Repair 0.5-0.9 % ophthalmic solution Generic drug: carboxymethylcellul-glycerin Place 1 drop into both eyes 3 (three) times daily.   rOPINIRole 1 MG tablet Commonly known as: REQUIP TAKE 2 TABLETS(2 MG) BY MOUTH AT BEDTIME   zinc oxide 20 % ointment Apply 1 application topically as needed for irritation.       Review of Systems  Constitutional: Negative for appetite change, fatigue and fever.  HENT: Positive for hearing loss. Negative for congestion and voice change.   Eyes: Negative for visual disturbance.  Respiratory: Negative for cough and shortness of breath.   Cardiovascular: Negative for leg swelling.  Gastrointestinal: Negative for diarrhea.       Fecal incontinence  Genitourinary: Positive for difficulty urinating. Negative for dysuria.       Foley  Musculoskeletal: Positive for arthralgias, back pain, gait problem and neck pain.       The right 2nd toe pain is better  Skin: Negative for color change.  Neurological: Positive for tremors. Negative  for speech difficulty.       Memory lapses. Essential tremor, peripheral neuropathy, RLS  Psychiatric/Behavioral: Negative for behavioral problems and sleep disturbance. The patient is not nervous/anxious.     Immunization History  Administered Date(s) Administered  . Influenza, High Dose Seasonal PF 09/17/2019  . Influenza-Unspecified 09/16/2018  . Moderna SARS-COVID-2 Vaccination 12/08/2019, 01/05/2020  . Pneumococcal Conjugate-13 12/29/2013  . Pneumococcal Polysaccharide-23 09/08/2005  . Tdap 05/25/2005, 08/08/2018  . Zoster Recombinat (Shingrix) 10/17/2017, 01/25/2018   Pertinent  Health Maintenance Due  Topic Date Due  . FOOT EXAM  Never done  . OPHTHALMOLOGY EXAM  Never done  . URINE MICROALBUMIN  Never done  . INFLUENZA VACCINE  07/04/2020  . PNA vac Low Risk Adult  Completed  . HEMOGLOBIN A1C  Discontinued   Fall Risk  01/01/2019 09/24/2018 01/04/2017 10/06/2016 09/29/2016  Falls in the past year? 0 No No No No   Functional Status Survey:    Vitals:   08/06/20 1344  BP: 102/67  Pulse: (!) 59  Resp: 20  Temp: (!) 97.3 F (36.3 C)  SpO2: 97%  Weight: 155 lb 8 oz (70.5 kg)  Height: 5\' 6"  (1.676 m)   Body mass index is 25.1 kg/m. Physical Exam Vitals and nursing note reviewed.  Constitutional:      Appearance: Normal appearance.  HENT:     Head: Normocephalic and atraumatic.     Mouth/Throat:     Mouth: Mucous membranes are moist.  Eyes:     Extraocular Movements: Extraocular movements intact.     Conjunctiva/sclera: Conjunctivae normal.     Pupils: Pupils are equal, round, and reactive to light.  Cardiovascular:     Rate and Rhythm: Normal rate and regular rhythm.     Heart sounds: Murmur heard.      Comments: Weak DP pulse on the right Pulmonary:     Effort: Pulmonary effort is normal.     Breath  sounds: No rales.  Abdominal:     General: Bowel sounds are normal.     Palpations: Abdomen is soft.     Tenderness: There is no abdominal tenderness.    Musculoskeletal:     Cervical back: Normal range of motion and neck supple.     Right lower leg: No edema.     Left lower leg: No edema.     Comments: The  thick callous at the tip of the right 2nd hammer toe. S/p the right great and 4th toe amputation.   Skin:    General: Skin is warm and dry.  Neurological:     General: No focal deficit present.     Mental Status: He is alert. Mental status is at baseline.     Gait: Gait abnormal.     Comments: Oriented to person, place.   Psychiatric:        Mood and Affect: Mood normal.        Behavior: Behavior normal.        Thought Content: Thought content normal.        Judgment: Judgment normal.     Labs reviewed: Recent Labs    01/15/20 0240 01/15/20 0240 01/16/20 0243 01/16/20 0243 01/17/20 0241 02/09/20 0000 05/06/20 0000  NA 135   < > 135   < > 135 138 139  K 3.9   < > 4.2   < > 3.8 4.1 4.2  CL 111   < > 112*   < > 108 105 106  CO2 16*   < > 18*   < > 20* 24* 25*  GLUCOSE 98  --  126*  --  100*  --   --   BUN 11   < > 11   < > 12 17 15   CREATININE 0.74   < > 0.70   < > 0.61 0.7 0.8  CALCIUM 7.6*   < > 7.6*   < > 7.7* 8.9 8.8  MG 1.8  --  1.8  --  1.7  --   --    < > = values in this interval not displayed.   Recent Labs    01/12/20 2110 02/09/20 0000 05/06/20 0000  AST 38 31 27  ALT 18 10 9*  ALKPHOS 76 98 91  BILITOT 0.6  --   --   PROT 6.5  --   --   ALBUMIN 3.0* 3.4* 3.5   Recent Labs    01/15/20 0240 01/15/20 0240 01/16/20 0243 01/16/20 0243 01/17/20 0241 02/09/20 0000 05/06/20 0000  WBC 18.0*   < > 16.8*   < > 11.6* 6.6 7.8  NEUTROABS 15.7*   < > 14.4*  --  9.4* 4,204  --   HGB 11.2*   < > 10.7*   < > 10.9* 13.0* 12.9*  HCT 33.9*   < > 33.2*   < > 34.0* 41 39*  MCV 91.1  --  93.0  --  91.6  --   --   PLT 256   < > 252   < > 256 272 224   < > = values in this interval not displayed.   Lab Results  Component Value Date   TSH 2.38 02/09/2020   Lab Results  Component Value Date   HGBA1C 5.2  08/29/2018   Lab Results  Component Value Date   CHOL 158 08/29/2018   HDL 34 (A) 05/06/2020   LDLCALC 103 05/06/2020   TRIG 154 05/06/2020  Significant Diagnostic Results in last 30 days:  No results found.  Assessment/Plan There are no diagnoses linked to this encounter.   Family/ staff Communication:   Labs/tests ordered:

## 2020-08-06 NOTE — Assessment & Plan Note (Signed)
Continue Requip. 

## 2020-08-06 NOTE — Assessment & Plan Note (Signed)
His mood is stable, continue Wellbutrin.

## 2020-08-06 NOTE — Assessment & Plan Note (Signed)
Stable, continue Pantoprazole.  

## 2020-08-06 NOTE — Assessment & Plan Note (Signed)
The patient desires DNR when the patient has no pulse and is not breathing. 30 minutes spend on explanation and discussion of MOST, pending decision regarding medical interventions when the patient has pulse and/or is breathing, antibiotics use, IVF or feeding tube when oral fluids and nutrition if physically not feasible.

## 2020-08-06 NOTE — Assessment & Plan Note (Signed)
The right 2nd toe pain, s/p surgeon consultation.

## 2020-08-06 NOTE — Assessment & Plan Note (Signed)
stable, on Propranolol 10mg qd. 

## 2020-08-06 NOTE — Assessment & Plan Note (Signed)
Chronic Foley, saw urology 06/10/20

## 2020-08-06 NOTE — Assessment & Plan Note (Signed)
Neuropathic pain in BLE, stable on Gabapentin 300mg  bid, 100mg  qd, Tylenol 500mg  tid, Norco q6hr prn.

## 2020-08-10 ENCOUNTER — Encounter: Payer: Self-pay | Admitting: Nurse Practitioner

## 2020-08-10 DIAGNOSIS — R2681 Unsteadiness on feet: Secondary | ICD-10-CM | POA: Diagnosis not present

## 2020-08-10 DIAGNOSIS — R2689 Other abnormalities of gait and mobility: Secondary | ICD-10-CM | POA: Diagnosis not present

## 2020-08-10 DIAGNOSIS — A0472 Enterocolitis due to Clostridium difficile, not specified as recurrent: Secondary | ICD-10-CM | POA: Diagnosis not present

## 2020-08-10 DIAGNOSIS — R293 Abnormal posture: Secondary | ICD-10-CM | POA: Diagnosis not present

## 2020-08-11 ENCOUNTER — Telehealth: Payer: Self-pay | Admitting: Adult Health

## 2020-08-11 DIAGNOSIS — R2681 Unsteadiness on feet: Secondary | ICD-10-CM | POA: Diagnosis not present

## 2020-08-11 DIAGNOSIS — R293 Abnormal posture: Secondary | ICD-10-CM | POA: Diagnosis not present

## 2020-08-11 DIAGNOSIS — R2689 Other abnormalities of gait and mobility: Secondary | ICD-10-CM | POA: Diagnosis not present

## 2020-08-11 DIAGNOSIS — A0472 Enterocolitis due to Clostridium difficile, not specified as recurrent: Secondary | ICD-10-CM | POA: Diagnosis not present

## 2020-08-11 NOTE — Telephone Encounter (Signed)
Sent message to [facility] for this pt.

## 2020-08-11 NOTE — Telephone Encounter (Signed)
Message Received: Today Towanda Octave, RN Hey there!   Replacement PAPs are being processed after high AHI patients.   Due to the Respironics recall - [employer] is behind on sending out equipment to DME providers. We are trying to make sure that NEW patients get setup to start therapy since the patients that are getting replacements already have a machine.   I hope that makes sense. :)   He is 2 down in my stack so I will see what I can do. We are scheduled 4 weeks out right now.

## 2020-08-11 NOTE — Telephone Encounter (Signed)
[facility]) asking an update on Pt's CPAP machine. Would like a call from the nurse.

## 2020-08-11 NOTE — Telephone Encounter (Signed)
I called and spoke to [place] with [facility] and let her know that they are back logged and he is 2 down in there orders for new machines.  She verbalized understanding.

## 2020-08-12 NOTE — Telephone Encounter (Signed)
Message Received: Today Towanda Octave, RN We will get him taken care of... I will also have an RT reach out to see if it's something that can be easily adjusted until we are able to replace.   I relayed to her that [facility] was DME that he purchased machine from, the new one will be from them.

## 2020-08-13 ENCOUNTER — Non-Acute Institutional Stay (SKILLED_NURSING_FACILITY): Payer: Medicare Other | Admitting: Internal Medicine

## 2020-08-13 ENCOUNTER — Encounter: Payer: Self-pay | Admitting: Internal Medicine

## 2020-08-13 DIAGNOSIS — G8929 Other chronic pain: Secondary | ICD-10-CM | POA: Diagnosis not present

## 2020-08-13 DIAGNOSIS — M5442 Lumbago with sciatica, left side: Secondary | ICD-10-CM | POA: Diagnosis not present

## 2020-08-13 DIAGNOSIS — R2681 Unsteadiness on feet: Secondary | ICD-10-CM | POA: Diagnosis not present

## 2020-08-13 DIAGNOSIS — M5441 Lumbago with sciatica, right side: Secondary | ICD-10-CM | POA: Diagnosis not present

## 2020-08-13 DIAGNOSIS — G629 Polyneuropathy, unspecified: Secondary | ICD-10-CM | POA: Diagnosis not present

## 2020-08-13 DIAGNOSIS — R293 Abnormal posture: Secondary | ICD-10-CM | POA: Diagnosis not present

## 2020-08-13 DIAGNOSIS — Z978 Presence of other specified devices: Secondary | ICD-10-CM | POA: Diagnosis not present

## 2020-08-13 DIAGNOSIS — M79672 Pain in left foot: Secondary | ICD-10-CM

## 2020-08-13 DIAGNOSIS — R2689 Other abnormalities of gait and mobility: Secondary | ICD-10-CM | POA: Diagnosis not present

## 2020-08-13 DIAGNOSIS — A0472 Enterocolitis due to Clostridium difficile, not specified as recurrent: Secondary | ICD-10-CM | POA: Diagnosis not present

## 2020-08-13 NOTE — Progress Notes (Signed)
Location:    [place] Room Number: 26 Place of Service:  SNF [phone]) Provider:  Veleta Miners MD  [name], MD  Patient Care Team: [name], MD as PCP - General (Internal Medicine) [name], MD (Hematology and Oncology) [name], MD as Attending Physician (Urology) [name], MD (Gastroenterology)  Extended Emergency Contact Information Primary Emergency Contact: Colman, [place] Mobile Phone: [phone] Relation: Son Preferred language: English Interpreter needed? No Secondary Emergency Contact: Melvin,Janet H Address: [address]          [address]          [place], [place] [address] [name] of [place] Phone: [phone] Mobile Phone: [phone] Relation: Spouse  Code Status:  Full Code Goals of care: Advanced Directive information Advanced Directives 08/06/2020  Does Patient Have a Medical Advance Directive? Yes  Type of Paramedic of [place];Living will  Does patient want to make changes to medical advance directive? No - Patient declined  Copy of [place] in Chart? Yes - validated most recent copy scanned in chart (See row information)  Would patient like information on creating a medical advance directive? -     Chief Complaint  Patient presents with  . Acute Visit    Bleeding from catheter     HPI:  Pt is a 86 y.o. male seen today for an acute visit for Bleeding around his Urinary Cathter Also c/o Pain in his Left Feet  Patient has number of medical prblems including Chronic Diarrhea, H/O AVM, Barretts Esphagus, OSA on CPAP, Hypertension, h/o Prostate cancer wih Chronic Foley Cathter, Chronic Pain due to Kyphosis andNeuropathy and C Diff colitis S/P Right Great Toe Amputation in 3/20 for Osteomyelitis  Has Chronic Foley Cathter. Some bleeding noticed around the Meatus.  No hematuria noticed.  No discharge no signs of any  infection.  Patient also has a complaint of pain in his left foot.  He states when he wakes up in the morning his leg really hurts.  No Open Wounds in Left feet  Past Medical History:  Diagnosis Date  . Anemia   . Arthritis   . AVM (arteriovenous malformation)   . Barrett's esophagus   . BPH (benign prostatic hyperplasia)   . Cataract   . Constipation   . CPAP (continuous positive airway pressure) dependence   . Depression   . Diverticulosis    diverticular bleeding  . Diverticulosis   . GERD (gastroesophageal reflux disease)   . History of radiation therapy   . HOH (hard of hearing)    wears hearing aids  . Hyperlipidemia   . Hypoglycemia   . Lymphocytosis 06/2012  . Mastodynia   . Murmur    slight  . Nasal congestion   . Osteoporosis   . Osteoporosis   . Prostate cancer ([place])   . Sleep apnea    wears CPAP  . Urinary retention with incomplete bladder emptying    pt uses bathroom and within 30 mins needs to go again  . Vitamin D deficiency    Past Surgical History:  Procedure Laterality Date  . AMPUTATION TOE Right 09/20/2017   Procedure: Right 4th toe amputation;  Surgeon: Wylene Simmer, MD;  Location: [place];  Service: Orthopedics;  Laterality: Right;  foot block  . AMPUTATION TOE Right 02/05/2019   Procedure: RIGHT GREAT TOE AMPUTATION;  Surgeon: Leandrew Koyanagi, MD;  Location: [place];  Service: Orthopedics;  Laterality: Right;  . BACK SURGERY  04/2013  . back surgury     f  . BLEPHAROPLASTY Bilateral 02/23/15  . CATARACT EXTRACTION    . COLONOSCOPY    . COLONOSCOPY N/A 10/02/2017   Procedure: COLONOSCOPY;  Surgeon: Doran Stabler, MD;  Location: [facility];  Service: Gastroenterology;  Laterality: N/A;  . COLONOSCOPY WITH PROPOFOL N/A 10/01/2017   Procedure: COLONOSCOPY WITH PROPOFOL;  Surgeon: Doran Stabler, MD;  Location: [facility];  Service: Gastroenterology;  Laterality: N/A;  . COLONOSCOPY WITH PROPOFOL N/A  03/07/2018   Procedure: COLONOSCOPY WITH PROPOFOL;  Surgeon: [name], MD;  [facility];  Service: Endoscopy;  Laterality: N/A;  . ESOPHAGOGASTRODUODENOSCOPY (EGD) WITH PROPOFOL N/A 10/01/2017   Procedure: ESOPHAGOGASTRODUODENOSCOPY (EGD) WITH PROPOFOL;  Surgeon: Doran Stabler, MD;  [facility];  Service: Gastroenterology;  Laterality: N/A;  . EYE SURGERY Bilateral   . FINGER SURGERY Right    right thumb  . HARDWARE REMOVAL Right 05/15/2016   Procedure: Right Lumbar Two-Lumbar Five Removal of Hardware;  Surgeon: Kristeen Miss, MD;  Location: [facility];  Service: Neurosurgery;  Laterality: Right;  Right L2-5 removal of hardware  . INGUINAL HERNIA REPAIR     x4  . IR RADIOLOGIST EVAL & MGMT  07/10/2018  . IR RADIOLOGIST EVAL & MGMT  09/05/2018  . IR SACROPLASTY BILATERAL  07/15/2018  . REPAIR SPIGELIAN HERNIA  2011  . [place] ,2014  . TONSILLECTOMY    . UPPER GASTROINTESTINAL ENDOSCOPY    . VASECTOMY      Allergies  Allergen Reactions  . Celebrex [Celecoxib] Other (See Comments)    Created stomach ulcers.  Yvette Rack [Cyclobenzaprine] Other (See Comments)    Caught in hiatal hernia and caused extreme pain    Allergies as of 08/13/2020      Reactions   Celebrex [celecoxib] Other (See Comments)   Created stomach ulcers.   Flexeril [cyclobenzaprine] Other (See Comments)   Caught in hiatal hernia and caused extreme pain      Medication List       Accurate as of August 13, 2020  3:56 PM. If you have any questions, ask your nurse or doctor.        acetaminophen 500 MG tablet Commonly known as: TYLENOL Take 500 mg by mouth 3 (three) times daily.   buPROPion 150 MG 12 hr tablet Commonly known as: WELLBUTRIN SR Take 150 mg by mouth 2 (two) times daily.   CENTRUM ADULTS PO Take 1 tablet by mouth daily.   PRESERVISION AREDS 2 PO Take 1 capsule by mouth 2 (two) times daily.   cholecalciferol 1000 units tablet Commonly known as:  VITAMIN D Take 1,000 Units by mouth daily.   diclofenac Sodium 1 % Gel Commonly known as: VOLTAREN Apply 2 g topically 4 (four) times daily.   docusate sodium 100 MG capsule Commonly known as: COLACE Take 100 mg by mouth daily.   gabapentin 300 MG capsule Commonly known as: NEURONTIN Take 300 mg by mouth 2 (two) times daily. due to numbness and tingling   gabapentin 100 MG capsule Commonly known as: NEURONTIN Take 100 mg by mouth every morning.   HYDROcodone-acetaminophen 5-325 MG tablet Commonly known as: NORCO/VICODIN Take one tablet by mouth every 6 hours as needed for moderate pain. Do not exceed 4gm of Tylenol in 24 hours.   Lactase Enzyme 3000 units tablet Generic drug: lactase Take 4,500 Units by mouth  3 (three) times daily with meals.   lovastatin 20 MG tablet Commonly known as: MEVACOR Take 20 mg by mouth at bedtime.   pantoprazole 40 MG tablet Commonly known as: PROTONIX Take 40 mg by mouth every morning.   propranolol 10 MG tablet Commonly known as: INDERAL Take 10 mg by mouth daily.   Refresh Repair 0.5-0.9 % ophthalmic solution Generic drug: carboxymethylcellul-glycerin Place 1 drop into both eyes 3 (three) times daily.   rOPINIRole 1 MG tablet Commonly known as: REQUIP TAKE 2 TABLETS(2 MG) BY MOUTH AT BEDTIME   zinc oxide 20 % ointment Apply 1 application topically as needed for irritation.       Review of Systems  Constitutional: Positive for activity change.  HENT: Negative.   Respiratory: Negative.   Cardiovascular: Negative.   Gastrointestinal: Negative.   Genitourinary: Negative for dysuria, frequency and penile pain.  Musculoskeletal: Positive for gait problem.  Skin: Positive for color change.  Neurological: Positive for weakness.  Psychiatric/Behavioral: Negative.     Immunization History  Administered Date(s) Administered  . Influenza, High Dose Seasonal PF 09/17/2019  . Influenza-Unspecified 09/16/2018  . Moderna  SARS-COVID-2 Vaccination 12/08/2019, 01/05/2020  . Pneumococcal Conjugate-13 12/29/2013  . Pneumococcal Polysaccharide-23 09/08/2005  . Tdap 05/25/2005, 08/08/2018  . Zoster Recombinat (Shingrix) 10/17/2017, 01/25/2018   Pertinent  Health Maintenance Due  Topic Date Due  . FOOT EXAM  Never done  . OPHTHALMOLOGY EXAM  Never done  . URINE MICROALBUMIN  Never done  . INFLUENZA VACCINE  07/04/2020  . PNA vac Low Risk Adult  Completed  . HEMOGLOBIN A1C  Discontinued   Fall Risk  01/01/2019 09/24/2018 01/04/2017 10/06/2016 09/29/2016  Falls in the past year? 0 No No No No   Functional Status Survey:    Vitals:   08/13/20 1552  BP: 123/77  Pulse: 63  Resp: 18  Temp: (!) 97.5 F (36.4 C)  SpO2: 97%  Weight: 155 lb 8 oz (70.5 kg)  Height: 5\' 6"  (1.676 m)   Body mass index is 25.1 kg/m. Physical Exam Vitals reviewed.  Constitutional:      Appearance: Normal appearance.  HENT:     Head: Normocephalic.     Nose: Nose normal.  Eyes:     Pupils: Pupils are equal, round, and reactive to light.  Cardiovascular:     Rate and Rhythm: Normal rate and regular rhythm.     Pulses: Normal pulses.  Pulmonary:     Effort: Pulmonary effort is normal.  Abdominal:     General: Abdomen is flat. Bowel sounds are normal.     Palpations: Abdomen is soft.  Genitourinary:    Comments: Small place on Meatus which had Tear due to Foley. No Signs of Infection Musculoskeletal:     Cervical back: Neck supple.     Comments: Left Leg seems cold and red with Feeble Pulses  Neurological:     General: No focal deficit present.     Mental Status: He is alert.  Psychiatric:        Mood and Affect: Mood normal.     Labs reviewed: Recent Labs    01/15/20 0240 01/15/20 0240 01/16/20 0243 01/16/20 0243 01/17/20 0241 02/09/20 0000 05/06/20 0000  NA 135   < > 135   < > 135 138 139  K 3.9   < > 4.2   < > 3.8 4.1 4.2  CL 111   < > 112*   < > 108 105 106  CO2 16*   < >  18*   < > 20* 24* 25*   GLUCOSE 98  --  126*  --  100*  --   --   BUN 11   < > 11   < > 12 17 15   CREATININE 0.74   < > 0.70   < > 0.61 0.7 0.8  CALCIUM 7.6*   < > 7.6*   < > 7.7* 8.9 8.8  MG 1.8  --  1.8  --  1.7  --   --    < > = values in this interval not displayed.   Recent Labs    01/12/20 2110 02/09/20 0000 05/06/20 0000  AST 38 31 27  ALT 18 10 9*  ALKPHOS 76 98 91  BILITOT 0.6  --   --   PROT 6.5  --   --   ALBUMIN 3.0* 3.4* 3.5   Recent Labs    01/15/20 0240 01/15/20 0240 01/16/20 0243 01/16/20 0243 01/17/20 0241 02/09/20 0000 05/06/20 0000  WBC 18.0*   < > 16.8*   < > 11.6* 6.6 7.8  NEUTROABS 15.7*   < > 14.4*  --  9.4* 4,204  --   HGB 11.2*   < > 10.7*   < > 10.9* 13.0* 12.9*  HCT 33.9*   < > 33.2*   < > 34.0* 41 39*  MCV 91.1  --  93.0  --  91.6  --   --   PLT 256   < > 252   < > 256 272 224   < > = values in this interval not displayed.   Lab Results  Component Value Date   TSH 2.38 02/09/2020   Lab Results  Component Value Date   HGBA1C 5.2 08/29/2018   Lab Results  Component Value Date   CHOL 158 08/29/2018   HDL 34 (A) 05/06/2020   LDLCALC 103 05/06/2020   TRIG 154 05/06/2020    Significant Diagnostic Results in last 30 days:  No results found.  Assessment/Plan Chronic indwelling Foley catheter Will apply ointment in Meatal area for the tear Loosen the Foley  Left foot pain ? PAD Get ABI in Facility  Chronic bilateral low back pain with bilateral sciatica On Norco PRN Neuropathy High dose of Neurontin OSA On CPAP H/O C Diff colitis Resolved Symptoms Hyperlipidemia On Mevacor Restless  On Requip Depression On Wellbutrin Weight has stabilized Tremors On Inderal Low dose Family/ staff Communication:   Labs/tests ordered:

## 2020-08-16 DIAGNOSIS — E785 Hyperlipidemia, unspecified: Secondary | ICD-10-CM | POA: Diagnosis not present

## 2020-08-16 DIAGNOSIS — D649 Anemia, unspecified: Secondary | ICD-10-CM | POA: Diagnosis not present

## 2020-08-16 DIAGNOSIS — R52 Pain, unspecified: Secondary | ICD-10-CM | POA: Diagnosis not present

## 2020-08-16 LAB — HEPATIC FUNCTION PANEL
ALT: 10 (ref 10–40)
AST: 27 (ref 14–40)
Alkaline Phosphatase: 91 (ref 25–125)
Bilirubin, Total: 0.4

## 2020-08-16 LAB — COMPREHENSIVE METABOLIC PANEL
Albumin: 3.7 (ref 3.5–5.0)
Calcium: 8.6 — AB (ref 8.7–10.7)
Globulin: 2.8

## 2020-08-16 LAB — BASIC METABOLIC PANEL
BUN: 15 (ref 4–21)
CO2: 24 — AB (ref 13–22)
Chloride: 108 (ref 99–108)
Creatinine: 0.8 (ref 0.6–1.3)
Glucose: 70
Potassium: 4 (ref 3.4–5.3)
Sodium: 139 (ref 137–147)

## 2020-08-16 LAB — CBC: RBC: 4.27 (ref 3.87–5.11)

## 2020-08-16 LAB — CBC AND DIFFERENTIAL
HCT: 39 — AB (ref 41–53)
Hemoglobin: 12.9 — AB (ref 13.5–17.5)
Platelets: 199 (ref 150–399)
WBC: 6.5

## 2020-08-17 DIAGNOSIS — A0472 Enterocolitis due to Clostridium difficile, not specified as recurrent: Secondary | ICD-10-CM | POA: Diagnosis not present

## 2020-08-17 DIAGNOSIS — R2681 Unsteadiness on feet: Secondary | ICD-10-CM | POA: Diagnosis not present

## 2020-08-17 DIAGNOSIS — R293 Abnormal posture: Secondary | ICD-10-CM | POA: Diagnosis not present

## 2020-08-17 DIAGNOSIS — R2689 Other abnormalities of gait and mobility: Secondary | ICD-10-CM | POA: Diagnosis not present

## 2020-08-18 DIAGNOSIS — R2689 Other abnormalities of gait and mobility: Secondary | ICD-10-CM | POA: Diagnosis not present

## 2020-08-18 DIAGNOSIS — R293 Abnormal posture: Secondary | ICD-10-CM | POA: Diagnosis not present

## 2020-08-18 DIAGNOSIS — R2681 Unsteadiness on feet: Secondary | ICD-10-CM | POA: Diagnosis not present

## 2020-08-18 DIAGNOSIS — A0472 Enterocolitis due to Clostridium difficile, not specified as recurrent: Secondary | ICD-10-CM | POA: Diagnosis not present

## 2020-08-20 DIAGNOSIS — A0472 Enterocolitis due to Clostridium difficile, not specified as recurrent: Secondary | ICD-10-CM | POA: Diagnosis not present

## 2020-08-20 DIAGNOSIS — R293 Abnormal posture: Secondary | ICD-10-CM | POA: Diagnosis not present

## 2020-08-20 DIAGNOSIS — R2689 Other abnormalities of gait and mobility: Secondary | ICD-10-CM | POA: Diagnosis not present

## 2020-08-20 DIAGNOSIS — R2681 Unsteadiness on feet: Secondary | ICD-10-CM | POA: Diagnosis not present

## 2020-08-24 DIAGNOSIS — R2681 Unsteadiness on feet: Secondary | ICD-10-CM | POA: Diagnosis not present

## 2020-08-24 DIAGNOSIS — R293 Abnormal posture: Secondary | ICD-10-CM | POA: Diagnosis not present

## 2020-08-24 DIAGNOSIS — R2689 Other abnormalities of gait and mobility: Secondary | ICD-10-CM | POA: Diagnosis not present

## 2020-08-24 DIAGNOSIS — A0472 Enterocolitis due to Clostridium difficile, not specified as recurrent: Secondary | ICD-10-CM | POA: Diagnosis not present

## 2020-08-25 DIAGNOSIS — R2681 Unsteadiness on feet: Secondary | ICD-10-CM | POA: Diagnosis not present

## 2020-08-25 DIAGNOSIS — R293 Abnormal posture: Secondary | ICD-10-CM | POA: Diagnosis not present

## 2020-08-25 DIAGNOSIS — R2689 Other abnormalities of gait and mobility: Secondary | ICD-10-CM | POA: Diagnosis not present

## 2020-08-25 DIAGNOSIS — A0472 Enterocolitis due to Clostridium difficile, not specified as recurrent: Secondary | ICD-10-CM | POA: Diagnosis not present

## 2020-08-27 DIAGNOSIS — B351 Tinea unguium: Secondary | ICD-10-CM | POA: Diagnosis not present

## 2020-08-27 DIAGNOSIS — M79671 Pain in right foot: Secondary | ICD-10-CM | POA: Diagnosis not present

## 2020-08-27 DIAGNOSIS — M79672 Pain in left foot: Secondary | ICD-10-CM | POA: Diagnosis not present

## 2020-08-30 ENCOUNTER — Non-Acute Institutional Stay (SKILLED_NURSING_FACILITY): Payer: Medicare Other | Admitting: Internal Medicine

## 2020-08-30 ENCOUNTER — Encounter: Payer: Self-pay | Admitting: Internal Medicine

## 2020-08-30 DIAGNOSIS — M79672 Pain in left foot: Secondary | ICD-10-CM | POA: Diagnosis not present

## 2020-08-30 DIAGNOSIS — Z978 Presence of other specified devices: Secondary | ICD-10-CM | POA: Diagnosis not present

## 2020-08-30 DIAGNOSIS — M5441 Lumbago with sciatica, right side: Secondary | ICD-10-CM | POA: Diagnosis not present

## 2020-08-30 DIAGNOSIS — G629 Polyneuropathy, unspecified: Secondary | ICD-10-CM | POA: Diagnosis not present

## 2020-08-30 DIAGNOSIS — M5442 Lumbago with sciatica, left side: Secondary | ICD-10-CM | POA: Diagnosis not present

## 2020-08-30 DIAGNOSIS — N62 Hypertrophy of breast: Secondary | ICD-10-CM

## 2020-08-30 DIAGNOSIS — G8929 Other chronic pain: Secondary | ICD-10-CM | POA: Diagnosis not present

## 2020-08-30 DIAGNOSIS — R293 Abnormal posture: Secondary | ICD-10-CM | POA: Diagnosis not present

## 2020-08-30 DIAGNOSIS — I10 Essential (primary) hypertension: Secondary | ICD-10-CM

## 2020-08-30 DIAGNOSIS — R2681 Unsteadiness on feet: Secondary | ICD-10-CM | POA: Diagnosis not present

## 2020-08-30 DIAGNOSIS — A0472 Enterocolitis due to Clostridium difficile, not specified as recurrent: Secondary | ICD-10-CM | POA: Diagnosis not present

## 2020-08-30 DIAGNOSIS — R2689 Other abnormalities of gait and mobility: Secondary | ICD-10-CM | POA: Diagnosis not present

## 2020-08-30 NOTE — Progress Notes (Signed)
Location:  [place] Room Number: 26 Place of Service:  SNF [phone])  Provider: Veleta Miners MD  Code Status: Managed Care Goals of Care:  Advanced Directives 08/30/2020  Does Patient Have a Medical Advance Directive? Yes  Type of Advance Directive Out of facility DNR (pink MOST or yellow form);Living will;Healthcare Power of Attorney  Does patient want to make changes to medical advance directive? No - Patient declined  Copy of [place] in Chart? Yes - validated most recent copy scanned in chart (See row information)  Would patient like information on creating a medical advance directive? -  Pre-existing out of facility DNR order (yellow form or pink MOST form) Yellow form placed in chart (order not valid for inpatient use);Pink MOST form placed in chart (order not valid for inpatient use)     Chief Complaint  Patient presents with  . Medical Management of Chronic Issues  . Health Maintenance    Foot and eye exam, urine microalbumin, Influenza vaccine    HPI: Patient is a 86 y.o. male seen today for medical management of chronic diseases.    Patient has number of medical prblems including Chronic Diarrhea, H/O AVM, Barretts Esphagus, OSA on CPAP, Hypertension, h/o Prostate cancer wih Chronic Foley Catheter, Chronic Pain due to Kyphosis andNeuropathy C Diff Colitis in 02/21  Now stays n SNF Wheelchair dependent  He has been c/o tingling and Numbness in his Breast and Nurse felt Small lump in his Left breast No Pain or Skin lesion  He also c/o Pain and Numbness in his Feet Left more then Right  Other wise he is doing well. Has gained weight. Appetite is fair No Diarrhea.       Past Medical History:  Diagnosis Date  . Anemia   . Arthritis   . AVM (arteriovenous malformation)   . Barrett's esophagus   . BPH (benign prostatic hyperplasia)   . Cataract   . Constipation   . CPAP (continuous positive airway pressure) dependence     . Depression   . Diverticulosis    diverticular bleeding  . Diverticulosis   . GERD (gastroesophageal reflux disease)   . History of radiation therapy   . HOH (hard of hearing)    wears hearing aids  . Hyperlipidemia   . Hypoglycemia   . Lymphocytosis 06/2012  . Mastodynia   . Murmur    slight  . Nasal congestion   . Osteoporosis   . Osteoporosis   . Prostate cancer ([place])   . Sleep apnea    wears CPAP  . Urinary retention with incomplete bladder emptying    pt uses bathroom and within 30 mins needs to go again  . Vitamin D deficiency     Past Surgical History:  Procedure Laterality Date  . AMPUTATION TOE Right 09/20/2017   Procedure: Right 4th toe amputation;  Surgeon: Wylene Simmer, MD;  Location: [place];  Service: Orthopedics;  Laterality: Right;  foot block  . AMPUTATION TOE Right 02/05/2019   Procedure: RIGHT GREAT TOE AMPUTATION;  Surgeon: Leandrew Koyanagi, MD;  Location: [place];  Service: Orthopedics;  Laterality: Right;  . BACK SURGERY  04/2013  . back surgury     f  . BLEPHAROPLASTY Bilateral 02/23/15  . CATARACT EXTRACTION    . COLONOSCOPY    . COLONOSCOPY N/A 10/02/2017   Procedure: COLONOSCOPY;  Surgeon: Doran Stabler, MD;  Location: [facility];  Service: Gastroenterology;  Laterality: N/A;  . COLONOSCOPY WITH PROPOFOL N/A 10/01/2017   Procedure: COLONOSCOPY WITH PROPOFOL;  Surgeon: Doran Stabler, MD;  [facility];  Service: Gastroenterology;  Laterality: N/A;  . COLONOSCOPY WITH PROPOFOL N/A 03/07/2018   Procedure: COLONOSCOPY WITH PROPOFOL;  Surgeon: [name], MD;  [facility];  Service: Endoscopy;  Laterality: N/A;  . ESOPHAGOGASTRODUODENOSCOPY (EGD) WITH PROPOFOL N/A 10/01/2017   Procedure: ESOPHAGOGASTRODUODENOSCOPY (EGD) WITH PROPOFOL;  Surgeon: Doran Stabler, MD;  [facility];  Service: Gastroenterology;  Laterality: N/A;  . EYE SURGERY Bilateral   . FINGER SURGERY  Right    right thumb  . HARDWARE REMOVAL Right 05/15/2016   Procedure: Right Lumbar Two-Lumbar Five Removal of Hardware;  Surgeon: Kristeen Miss, MD;  Location: [facility];  Service: Neurosurgery;  Laterality: Right;  Right L2-5 removal of hardware  . INGUINAL HERNIA REPAIR     x4  . IR RADIOLOGIST EVAL & MGMT  07/10/2018  . IR RADIOLOGIST EVAL & MGMT  09/05/2018  . IR SACROPLASTY BILATERAL  07/15/2018  . REPAIR SPIGELIAN HERNIA  2011  . [place] ,2014  . TONSILLECTOMY    . UPPER GASTROINTESTINAL ENDOSCOPY    . VASECTOMY      Allergies  Allergen Reactions  . Celebrex [Celecoxib] Other (See Comments)    Created stomach ulcers.  Yvette Rack [Cyclobenzaprine] Other (See Comments)    Caught in hiatal hernia and caused extreme pain    Outpatient Encounter Medications as of 08/30/2020  Medication Sig  . acetaminophen (TYLENOL) 500 MG tablet Take 500 mg by mouth 3 (three) times daily.   Marland Kitchen buPROPion (WELLBUTRIN SR) 150 MG 12 hr tablet Take 150 mg by mouth 2 (two) times daily.   . carboxymethylcellul-glycerin (REFRESH REPAIR) 0.5-0.9 % ophthalmic solution Place 1 drop into both eyes 3 (three) times daily.  . cholecalciferol (VITAMIN D) 1000 UNITS tablet Take 1,000 Units by mouth daily.  . diclofenac Sodium (VOLTAREN) 1 % GEL Apply 2 g topically 4 (four) times daily.  Marland Kitchen docusate sodium (COLACE) 100 MG capsule Take 100 mg by mouth daily.  Marland Kitchen gabapentin (NEURONTIN) 100 MG capsule Take 100 mg by mouth every morning.  . gabapentin (NEURONTIN) 300 MG capsule Take 300 mg by mouth 2 (two) times daily. due to numbness and tingling  . HYDROcodone-acetaminophen (NORCO/VICODIN) 5-325 MG tablet Take one tablet by mouth every 6 hours as needed for moderate pain. Do not exceed 4gm of Tylenol in 24 hours.  Marland Kitchen lactase (LACTASE ENZYME) 3000 units tablet Take 4,500 Units by mouth 3 (three) times daily with meals.   . lovastatin (MEVACOR) 20 MG tablet Take 20 mg by mouth at bedtime.   . Multiple  Vitamins-Minerals (CENTRUM ADULTS PO) Take 1 tablet by mouth daily.  . Multiple Vitamins-Minerals (PRESERVISION AREDS 2 PO) Take 1 capsule by mouth 2 (two) times daily.  . pantoprazole (PROTONIX) 40 MG tablet Take 40 mg by mouth every morning.   . propranolol (INDERAL) 10 MG tablet Take 10 mg by mouth daily.   Marland Kitchen rOPINIRole (REQUIP) 1 MG tablet TAKE 2 TABLETS(2 MG) BY MOUTH AT BEDTIME  . zinc oxide 20 % ointment Apply 1 application topically as needed for irritation.   No facility-administered encounter medications on file as of 08/30/2020.    Review of Systems:  Review of Systems  Constitutional: Negative.   HENT: Negative.   Respiratory: Negative.   Cardiovascular: Negative.   Gastrointestinal: Negative.   Genitourinary: Positive for penile pain.  Musculoskeletal: Positive for gait problem.  Neurological: Positive for weakness and numbness.  Psychiatric/Behavioral: Negative.   All other systems reviewed and are negative.   Health Maintenance  Topic Date Due  . OPHTHALMOLOGY EXAM  Never done  . URINE MICROALBUMIN  Never done  . FOOT EXAM  09/28/2019  . INFLUENZA VACCINE  07/04/2020  . TETANUS/TDAP  08/08/2028  . COVID-19 Vaccine  Completed  . PNA vac Low Risk Adult  Completed  . HEMOGLOBIN A1C  Discontinued    Physical Exam: There were no vitals filed for this visit. There is no height or weight on file to calculate BMI. Physical Exam Vitals reviewed.  Constitutional:      Appearance: Normal appearance.  HENT:     Head: Normocephalic.     Nose: Nose normal.     Mouth/Throat:     Mouth: Mucous membranes are moist.     Pharynx: Oropharynx is clear.  Eyes:     Pupils: Pupils are equal, round, and reactive to light.  Cardiovascular:     Rate and Rhythm: Normal rate and regular rhythm.     Pulses: Normal pulses.  Pulmonary:     Effort: Pulmonary effort is normal.     Breath sounds: Normal breath sounds.     Comments: Can feel Small Lump in Left breast Smooth not  tender. Right breast was fine Abdominal:     General: Abdomen is flat. Bowel sounds are normal.     Palpations: Abdomen is soft.  Musculoskeletal:        General: No swelling.     Cervical back: Neck supple.  Skin:    General: Skin is warm.  Neurological:     Mental Status: He is alert and oriented to person, place, and time.  Psychiatric:        Mood and Affect: Mood normal.     Labs reviewed: Basic Metabolic Panel: Recent Labs    01/15/20 0240 01/15/20 0240 01/16/20 0243 01/16/20 0243 01/17/20 0241 01/17/20 0241 02/09/20 0000 05/06/20 0000 08/16/20 0000  NA 135   < > 135   < > 135  --  138 139 139  K 3.9   < > 4.2   < > 3.8   < > 4.1 4.2 4.0  CL 111   < > 112*   < > 108   < > 105 106 108  CO2 16*   < > 18*   < > 20*   < > 24* 25* 24*  GLUCOSE 98  --  126*  --  100*  --   --   --   --   BUN 11   < > 11   < > 12  --  17 15 15   CREATININE 0.74   < > 0.70   < > 0.61  --  0.7 0.8 0.8  CALCIUM 7.6*   < > 7.6*   < > 7.7*   < > 8.9 8.8 8.6*  MG 1.8  --  1.8  --  1.7  --   --   --   --   TSH  --   --   --   --   --   --  2.38  --   --    < > = values in this interval not displayed.   Liver Function Tests: Recent Labs    01/12/20 2110 01/12/20 2110 02/09/20 0000 05/06/20 0000 08/16/20 0000  AST 38   < > 31 27 27  ALT 18   < > 10 9* 10  ALKPHOS 76   < > 98 91 91  BILITOT 0.6  --   --   --   --   PROT 6.5  --   --   --   --   ALBUMIN 3.0*   < > 3.4* 3.5 3.7   < > = values in this interval not displayed.   Recent Labs    01/12/20 2110  LIPASE 15   No results for input(s): AMMONIA in the last 8760 hours. CBC: Recent Labs    01/15/20 0240 01/15/20 0240 01/16/20 0243 01/16/20 0243 01/17/20 0241 01/17/20 0241 02/09/20 0000 05/06/20 0000 08/16/20 0000  WBC 18.0*   < > 16.8*   < > 11.6*  --  6.6 7.8 6.5  NEUTROABS 15.7*   < > 14.4*  --  9.4*  --  4,204  --   --   HGB 11.2*   < > 10.7*   < > 10.9*   < > 13.0* 12.9* 12.9*  HCT 33.9*   < > 33.2*   < > 34.0*    < > 41 39* 39*  MCV 91.1  --  93.0  --  91.6  --   --   --   --   PLT 256   < > 252   < > 256   < > 272 224 199   < > = values in this interval not displayed.   Lipid Panel: Recent Labs    05/06/20 0000  HDL 34*  LDLCALC 103  TRIG 154   Lab Results  Component Value Date   HGBA1C 5.2 08/29/2018    Procedures since last visit: No results found.  Assessment/Plan Gynecomastia, male [place] of Breast  Left foot pain Ruled out PAD with normal ABI Most likely Neuropathy increase Neurontin to 200 mg in am Continue Neurontin 300 mg BID  Chronic bilateral low back pain with bilateral sciatica Neurontin and Norco for pain  Chronic indwelling Foley catheter No More Hematuria  Depression Seems stable on Wellbutrin Hyperlipidemia On Mevacor LDL 10 in 6/21 OSA  On CPAP Essential Tremor On Inderal   Labs/tests ordered:  * No order type specified *   Total time spent in this patient care encounter was  45_  minutes; greater than 50% of the visit spent counseling patient and staff, reviewing records , Labs and coordinating care for problems addressed at this encounter.  Next appt:  Visit date not found

## 2020-09-02 ENCOUNTER — Encounter: Payer: Self-pay | Admitting: Internal Medicine

## 2020-09-02 DIAGNOSIS — R2689 Other abnormalities of gait and mobility: Secondary | ICD-10-CM | POA: Diagnosis not present

## 2020-09-02 DIAGNOSIS — A0472 Enterocolitis due to Clostridium difficile, not specified as recurrent: Secondary | ICD-10-CM | POA: Diagnosis not present

## 2020-09-02 DIAGNOSIS — R293 Abnormal posture: Secondary | ICD-10-CM | POA: Diagnosis not present

## 2020-09-02 DIAGNOSIS — R2681 Unsteadiness on feet: Secondary | ICD-10-CM | POA: Diagnosis not present

## 2020-09-03 DIAGNOSIS — R52 Pain, unspecified: Secondary | ICD-10-CM | POA: Diagnosis not present

## 2020-09-03 DIAGNOSIS — M6281 Muscle weakness (generalized): Secondary | ICD-10-CM | POA: Diagnosis not present

## 2020-09-03 DIAGNOSIS — R2681 Unsteadiness on feet: Secondary | ICD-10-CM | POA: Diagnosis not present

## 2020-09-03 DIAGNOSIS — R293 Abnormal posture: Secondary | ICD-10-CM | POA: Diagnosis not present

## 2020-09-03 DIAGNOSIS — R2689 Other abnormalities of gait and mobility: Secondary | ICD-10-CM | POA: Diagnosis not present

## 2020-09-05 DIAGNOSIS — R229 Localized swelling, mass and lump, unspecified: Secondary | ICD-10-CM | POA: Diagnosis not present

## 2020-09-06 DIAGNOSIS — R52 Pain, unspecified: Secondary | ICD-10-CM | POA: Diagnosis not present

## 2020-09-06 DIAGNOSIS — R2681 Unsteadiness on feet: Secondary | ICD-10-CM | POA: Diagnosis not present

## 2020-09-06 DIAGNOSIS — R293 Abnormal posture: Secondary | ICD-10-CM | POA: Diagnosis not present

## 2020-09-06 DIAGNOSIS — R2689 Other abnormalities of gait and mobility: Secondary | ICD-10-CM | POA: Diagnosis not present

## 2020-09-06 DIAGNOSIS — M6281 Muscle weakness (generalized): Secondary | ICD-10-CM | POA: Diagnosis not present

## 2020-09-08 DIAGNOSIS — M6281 Muscle weakness (generalized): Secondary | ICD-10-CM | POA: Diagnosis not present

## 2020-09-08 DIAGNOSIS — R2689 Other abnormalities of gait and mobility: Secondary | ICD-10-CM | POA: Diagnosis not present

## 2020-09-08 DIAGNOSIS — R293 Abnormal posture: Secondary | ICD-10-CM | POA: Diagnosis not present

## 2020-09-08 DIAGNOSIS — R52 Pain, unspecified: Secondary | ICD-10-CM | POA: Diagnosis not present

## 2020-09-08 DIAGNOSIS — R2681 Unsteadiness on feet: Secondary | ICD-10-CM | POA: Diagnosis not present

## 2020-09-10 DIAGNOSIS — M6281 Muscle weakness (generalized): Secondary | ICD-10-CM | POA: Diagnosis not present

## 2020-09-10 DIAGNOSIS — R2681 Unsteadiness on feet: Secondary | ICD-10-CM | POA: Diagnosis not present

## 2020-09-10 DIAGNOSIS — R2689 Other abnormalities of gait and mobility: Secondary | ICD-10-CM | POA: Diagnosis not present

## 2020-09-10 DIAGNOSIS — R293 Abnormal posture: Secondary | ICD-10-CM | POA: Diagnosis not present

## 2020-09-10 DIAGNOSIS — R52 Pain, unspecified: Secondary | ICD-10-CM | POA: Diagnosis not present

## 2020-09-13 DIAGNOSIS — R293 Abnormal posture: Secondary | ICD-10-CM | POA: Diagnosis not present

## 2020-09-13 DIAGNOSIS — R2689 Other abnormalities of gait and mobility: Secondary | ICD-10-CM | POA: Diagnosis not present

## 2020-09-13 DIAGNOSIS — M6281 Muscle weakness (generalized): Secondary | ICD-10-CM | POA: Diagnosis not present

## 2020-09-13 DIAGNOSIS — R52 Pain, unspecified: Secondary | ICD-10-CM | POA: Diagnosis not present

## 2020-09-13 DIAGNOSIS — R2681 Unsteadiness on feet: Secondary | ICD-10-CM | POA: Diagnosis not present

## 2020-09-14 ENCOUNTER — Non-Acute Institutional Stay (SKILLED_NURSING_FACILITY): Payer: Medicare Other | Admitting: Nurse Practitioner

## 2020-09-14 ENCOUNTER — Encounter: Payer: Self-pay | Admitting: Nurse Practitioner

## 2020-09-14 DIAGNOSIS — R2681 Unsteadiness on feet: Secondary | ICD-10-CM | POA: Diagnosis not present

## 2020-09-14 DIAGNOSIS — R52 Pain, unspecified: Secondary | ICD-10-CM | POA: Diagnosis not present

## 2020-09-14 DIAGNOSIS — G4752 REM sleep behavior disorder: Secondary | ICD-10-CM

## 2020-09-14 DIAGNOSIS — M6281 Muscle weakness (generalized): Secondary | ICD-10-CM | POA: Diagnosis not present

## 2020-09-14 DIAGNOSIS — R293 Abnormal posture: Secondary | ICD-10-CM | POA: Diagnosis not present

## 2020-09-14 DIAGNOSIS — R2689 Other abnormalities of gait and mobility: Secondary | ICD-10-CM | POA: Diagnosis not present

## 2020-09-14 DIAGNOSIS — F339 Major depressive disorder, recurrent, unspecified: Secondary | ICD-10-CM | POA: Diagnosis not present

## 2020-09-14 DIAGNOSIS — G250 Essential tremor: Secondary | ICD-10-CM

## 2020-09-14 DIAGNOSIS — R339 Retention of urine, unspecified: Secondary | ICD-10-CM | POA: Diagnosis not present

## 2020-09-14 DIAGNOSIS — G629 Polyneuropathy, unspecified: Secondary | ICD-10-CM

## 2020-09-14 DIAGNOSIS — K219 Gastro-esophageal reflux disease without esophagitis: Secondary | ICD-10-CM | POA: Diagnosis not present

## 2020-09-14 NOTE — Assessment & Plan Note (Signed)
Foley chronic, f/u Urology 06/10/20. 

## 2020-09-14 NOTE — Assessment & Plan Note (Signed)
His mood is stable, on Wellbutrin 150mg bid.  

## 2020-09-14 NOTE — Assessment & Plan Note (Signed)
Essential tremor, stable, on Propranolol 10mg qd.   

## 2020-09-14 NOTE — Progress Notes (Signed)
Location:    [facility] Room Number: 26 Place of Service:  SNF ([phone]) Provider:  Marda Stalker, Lennie Odor NP  [name], MD  Patient Care Team: [name], MD as PCP - General (Internal Medicine) [name], MD (Hematology and Oncology) [name], MD as Attending Physician (Urology) [name], MD (Gastroenterology)  Extended Emergency Contact Information Primary Emergency Contact: Jehu, Mccauslin Mobile Phone: [phone] Relation: Son Preferred language: English Interpreter needed? No Secondary Emergency Contact: Duet,Janet H Address: [address]          [address]          [place], [place] [address] [name] of [place] Phone: [phone] Mobile Phone: [phone] Relation: Spouse  Code Status:  DNR Goals of care: Advanced Directive information Advanced Directives 09/14/2020  Does Patient Have a Medical Advance Directive? Yes  Type of Paramedic of [place];Living will;Out of facility DNR (pink MOST or yellow form)  Does patient want to make changes to medical advance directive? No - Patient declined  Copy of [place] in Chart? Yes - validated most recent copy scanned in chart (See row information)  Would patient like information on creating a medical advance directive? -  Pre-existing out of facility DNR order (yellow form or pink MOST form) Yellow form placed in chart (order not valid for inpatient use);Pink MOST form placed in chart (order not valid for inpatient use)     Chief Complaint  Patient presents with  . Medical Management of Chronic Issues  . Health Maintenance    Eye and foot exam, urine microalbumin    HPI:  Pt is a 86 y.o. male seen today for medical management of chronic diseases.      Hx ofRLS/REM, stable, on Requip 2mg  qd.              Essential tremor, stable, on Propranolol 10mg  qd.              GERD, stable, on Pantoprazole 40mg  qd.               Neuropathic pain in BLE, takes Gabapentin 300mg  bid, 200mg  qd, Tylenol 500mg  tid, Norco q6hr prn.c/o R 2nd 3rd hammer toes pain with walking, L 4th, 5th toes pain when touched, right lower back to the right leg pain when sitting in w/c for a long period of time.              His mood is stable, on Wellbutrin 150mg  bid.             Foley chronic, f/u Urology 06/10/20.          Past Medical History:  Diagnosis Date  . Anemia   . Arthritis   . AVM (arteriovenous malformation)   . Barrett's esophagus   . BPH (benign prostatic hyperplasia)   . Cataract   . Constipation   . CPAP (continuous positive airway pressure) dependence   . Depression   . Diverticulosis    diverticular bleeding  . Diverticulosis   . GERD (gastroesophageal reflux disease)   . History of radiation therapy   . HOH (hard of hearing)    wears hearing aids  . Hyperlipidemia   . Hypoglycemia   . Lymphocytosis 06/2012  . Mastodynia   . Murmur    slight  . Nasal congestion   . Osteoporosis   . Osteoporosis   . Prostate cancer ([place])   . Sleep apnea  wears CPAP  . Urinary retention with incomplete bladder emptying    pt uses bathroom and within 30 mins needs to go again  . Vitamin D deficiency    Past Surgical History:  Procedure Laterality Date  . AMPUTATION TOE Right 09/20/2017   Procedure: Right 4th toe amputation;  Surgeon: Wylene Simmer, MD;  Location: [facility];  Service: Orthopedics;  Laterality: Right;  foot block  . AMPUTATION TOE Right 02/05/2019   Procedure: RIGHT GREAT TOE AMPUTATION;  Surgeon: Leandrew Koyanagi, MD;  Location: [facility];  Service: Orthopedics;  Laterality: Right;  . BACK SURGERY  04/2013  . back surgury     f  . BLEPHAROPLASTY Bilateral 02/23/15  . CATARACT EXTRACTION    . COLONOSCOPY    . COLONOSCOPY N/A 10/02/2017   Procedure: COLONOSCOPY;  Surgeon: Doran Stabler, MD;  Location: [facility];  Service: Gastroenterology;  Laterality: N/A;  .  COLONOSCOPY WITH PROPOFOL N/A 10/01/2017   Procedure: COLONOSCOPY WITH PROPOFOL;  Surgeon: Doran Stabler, MD;  Location: [facility];  Service: Gastroenterology;  Laterality: N/A;  . COLONOSCOPY WITH PROPOFOL N/A 03/07/2018   Procedure: COLONOSCOPY WITH PROPOFOL;  Surgeon: [name], MD;  Location: [facility];  Service: Endoscopy;  Laterality: N/A;  . ESOPHAGOGASTRODUODENOSCOPY (EGD) WITH PROPOFOL N/A 10/01/2017   Procedure: ESOPHAGOGASTRODUODENOSCOPY (EGD) WITH PROPOFOL;  Surgeon: Doran Stabler, MD;  Location: [facility];  Service: Gastroenterology;  Laterality: N/A;  . EYE SURGERY Bilateral   . FINGER SURGERY Right    right thumb  . HARDWARE REMOVAL Right 05/15/2016   Procedure: Right Lumbar Two-Lumbar Five Removal of Hardware;  Surgeon: Kristeen Miss, MD;  Location: [facility];  Service: Neurosurgery;  Laterality: Right;  Right L2-5 removal of hardware  . INGUINAL HERNIA REPAIR     x4  . IR RADIOLOGIST EVAL & MGMT  07/10/2018  . IR RADIOLOGIST EVAL & MGMT  09/05/2018  . IR SACROPLASTY BILATERAL  07/15/2018  . REPAIR SPIGELIAN HERNIA  2011  . [place] ,2014  . TONSILLECTOMY    . UPPER GASTROINTESTINAL ENDOSCOPY    . VASECTOMY      Allergies  Allergen Reactions  . Celebrex [Celecoxib] Other (See Comments)    Created stomach ulcers.  Yvette Rack [Cyclobenzaprine] Other (See Comments)    Caught in hiatal hernia and caused extreme pain    Allergies as of 09/14/2020      Reactions   Celebrex [celecoxib] Other (See Comments)   Created stomach ulcers.   Flexeril [cyclobenzaprine] Other (See Comments)   Caught in hiatal hernia and caused extreme pain      Medication List       Accurate as of September 14, 2020 11:59 PM. If you have any questions, ask your nurse or doctor.        acetaminophen 500 MG tablet Commonly known as: TYLENOL Take 500 mg by mouth 3 (three) times daily.   buPROPion 150 MG 12 hr tablet Commonly known as: WELLBUTRIN SR Take  150 mg by mouth 2 (two) times daily.   CENTRUM ADULTS PO Take 1 tablet by mouth daily.   PRESERVISION AREDS 2 PO Take 1 capsule by mouth 2 (two) times daily.   cholecalciferol 1000 units tablet Commonly known as: VITAMIN D Take 1,000 Units by mouth daily.   cyanocobalamin 1000 MCG tablet Take 1,000 mcg by mouth daily.   diclofenac Sodium 1 % Gel Commonly known as: VOLTAREN Apply 2 g topically  4 (four) times daily.   docusate sodium 100 MG capsule Commonly known as: COLACE Take 100 mg by mouth daily.   gabapentin 300 MG capsule Commonly known as: NEURONTIN Take 300 mg by mouth 2 (two) times daily. due to numbness and tingling   gabapentin 100 MG capsule Commonly known as: NEURONTIN Take 200 mg by mouth every morning.   HYDROcodone-acetaminophen 5-325 MG tablet Commonly known as: NORCO/VICODIN Take one tablet by mouth every 6 hours as needed for moderate pain. Do not exceed 4gm of Tylenol in 24 hours.   Lactase Enzyme 3000 units tablet Generic drug: lactase Take 4,500 Units by mouth 3 (three) times daily with meals.   lovastatin 20 MG tablet Commonly known as: MEVACOR Take 20 mg by mouth at bedtime.   pantoprazole 40 MG tablet Commonly known as: PROTONIX Take 40 mg by mouth every morning.   polyethylene glycol 17 g packet Commonly known as: MIRALAX / GLYCOLAX Take 17 g by mouth daily as needed.   propranolol 10 MG tablet Commonly known as: INDERAL Take 10 mg by mouth daily.   Refresh Repair 0.5-0.9 % ophthalmic solution Generic drug: carboxymethylcellul-glycerin Place 1 drop into both eyes 3 (three) times daily.   rOPINIRole 1 MG tablet Commonly known as: REQUIP TAKE 2 TABLETS(2 MG) BY MOUTH AT BEDTIME   zinc oxide 20 % ointment Apply 1 application topically as needed for irritation.       Review of Systems  Constitutional: Negative for fatigue, fever and unexpected weight change.  HENT: Positive for hearing loss. Negative for congestion and voice  change.   Eyes: Negative for visual disturbance.  Respiratory: Negative for cough and shortness of breath.   Cardiovascular: Negative for leg swelling.  Gastrointestinal: Negative for diarrhea.       Fecal incontinence  Genitourinary: Positive for difficulty urinating. Negative for dysuria.       Foley  Musculoskeletal: Positive for arthralgias, back pain, gait problem and neck pain.       R 2nd 3rd hammer toes pain with walking, L 4th, 5th toes pain when touched, right lower back to the right leg pain when sitting in w/c for a long period of time.   Skin: Negative for color change.  Neurological: Positive for tremors. Negative for speech difficulty.       Memory lapses. Essential tremor, peripheral neuropathy, RLS  Psychiatric/Behavioral: Negative for behavioral problems and sleep disturbance. The patient is not nervous/anxious.     Immunization History  Administered Date(s) Administered  . Influenza, High Dose Seasonal PF 09/17/2019  . Influenza-Unspecified 09/16/2018, 09/07/2020  . Moderna SARS-COVID-2 Vaccination 12/08/2019, 01/05/2020  . Pneumococcal Conjugate-13 12/29/2013  . Pneumococcal Polysaccharide-23 09/08/2005  . Tdap 05/25/2005, 08/08/2018  . Zoster Recombinat (Shingrix) 10/17/2017, 01/25/2018   Pertinent  Health Maintenance Due  Topic Date Due  . OPHTHALMOLOGY EXAM  Never done  . URINE MICROALBUMIN  Never done  . FOOT EXAM  09/28/2019  . INFLUENZA VACCINE  Completed  . PNA vac Low Risk Adult  Completed  . HEMOGLOBIN A1C  Discontinued   Fall Risk  01/01/2019 09/24/2018 01/04/2017 10/06/2016 09/29/2016  Falls in the past year? 0 No No No No   Functional Status Survey:    Vitals:   09/14/20 0848  BP: 121/68  Pulse: 66  Resp: 19  Temp: (!) 97.1 F (36.2 C)  SpO2: 97%  Weight: 158 lb 8 oz (71.9 kg)  Height: 5\' 6"  (1.676 m)   Body mass index is 25.58 kg/m. Physical Exam Vitals  and nursing note reviewed.  Constitutional:      Appearance: Normal appearance.   HENT:     Head: Normocephalic and atraumatic.  Eyes:     Extraocular Movements: Extraocular movements intact.     Conjunctiva/sclera: Conjunctivae normal.     Pupils: Pupils are equal, round, and reactive to light.  Cardiovascular:     Rate and Rhythm: Normal rate and regular rhythm.     Heart sounds: Murmur heard.      Comments: Weak DP pulse on the right Pulmonary:     Effort: Pulmonary effort is normal.     Breath sounds: No rales.  Abdominal:     General: Bowel sounds are normal.     Palpations: Abdomen is soft.     Tenderness: There is no abdominal tenderness.  Genitourinary:    Comments: Foley  Musculoskeletal:        General: Tenderness present.     Cervical back: Normal range of motion and neck supple.     Right lower leg: No edema.     Left lower leg: No edema.     Comments:  S/p the right great and 4th toe amputation. R 2nd 3rd hammer toes pain with walking, L 4th, 5th toes pain when touched, right lower back to the right leg pain when sitting in w/c for a long period of time.   Skin:    General: Skin is warm and dry.  Neurological:     General: No focal deficit present.     Mental Status: He is alert. Mental status is at baseline.     Gait: Gait abnormal.     Comments: Oriented to person, place.   Psychiatric:        Mood and Affect: Mood normal.        Behavior: Behavior normal.        Thought Content: Thought content normal.        Judgment: Judgment normal.     Labs reviewed: Recent Labs    01/15/20 0240 01/15/20 0240 01/16/20 0243 01/16/20 0243 01/17/20 0241 01/17/20 0241 02/09/20 0000 05/06/20 0000 08/16/20 0000  NA 135   < > 135   < > 135  --  138 139 139  K 3.9   < > 4.2   < > 3.8   < > 4.1 4.2 4.0  CL 111   < > 112*   < > 108   < > 105 106 108  CO2 16*   < > 18*   < > 20*   < > 24* 25* 24*  GLUCOSE 98  --  126*  --  100*  --   --   --   --   BUN 11   < > 11   < > 12  --  17 15 15   CREATININE 0.74   < > 0.70   < > 0.61  --  0.7 0.8 0.8    CALCIUM 7.6*   < > 7.6*   < > 7.7*   < > 8.9 8.8 8.6*  MG 1.8  --  1.8  --  1.7  --   --   --   --    < > = values in this interval not displayed.   Recent Labs    01/12/20 2110 01/12/20 2110 02/09/20 0000 05/06/20 0000 08/16/20 0000  AST 38   < > 31 27 27   ALT 18   < > 10 9* 10  ALKPHOS 76   < >  98 91 91  BILITOT 0.6  --   --   --   --   PROT 6.5  --   --   --   --   ALBUMIN 3.0*   < > 3.4* 3.5 3.7   < > = values in this interval not displayed.   Recent Labs    01/15/20 0240 01/15/20 0240 01/16/20 0243 01/16/20 0243 01/17/20 0241 01/17/20 0241 02/09/20 0000 05/06/20 0000 08/16/20 0000  WBC 18.0*   < > 16.8*   < > 11.6*  --  6.6 7.8 6.5  NEUTROABS 15.7*   < > 14.4*  --  9.4*  --  4,204  --   --   HGB 11.2*   < > 10.7*   < > 10.9*   < > 13.0* 12.9* 12.9*  HCT 33.9*   < > 33.2*   < > 34.0*   < > 41 39* 39*  MCV 91.1  --  93.0  --  91.6  --   --   --   --   PLT 256   < > 252   < > 256   < > 272 224 199   < > = values in this interval not displayed.   Lab Results  Component Value Date   TSH 2.38 02/09/2020   Lab Results  Component Value Date   HGBA1C 5.2 08/29/2018   Lab Results  Component Value Date   CHOL 158 08/29/2018   HDL 34 (A) 05/06/2020   LDLCALC 103 05/06/2020   TRIG 154 05/06/2020    Significant Diagnostic Results in last 30 days:  No results found.  Assessment/Plan Neuropathy Neuropathic pain in BLE, takes Gabapentin 300mg  bid, 200mg  qd, Tylenol 500mg  tid, Norco q6hr prn.c/o R 2nd 3rd hammer toes pain with walking, L 4th, 5th toes pain when touched.    Depression, recurrent ([place]) His mood is stable, on Wellbutrin 150mg  bid.   Urinary retention Foley chronic, f/u Urology 06/10/20.   Essential tremor Essential tremor, stable, on Propranolol 10mg  qd.   GERD Stable, continue Pantoprazole.   REM sleep behavior disorder Hx ofRLS/REM, stable, on Requip 2mg  qd.       Family/ staff Communication: plan of care reviewed with the  patient and charge nurse.   Labs/tests ordered:  none  Time spend 40 minutes.

## 2020-09-14 NOTE — Assessment & Plan Note (Signed)
Stable, continue Pantoprazole.  

## 2020-09-14 NOTE — Assessment & Plan Note (Signed)
Hx ofRLS/REM, stable, on Requip 2mg qd.   

## 2020-09-14 NOTE — Assessment & Plan Note (Addendum)
Neuropathic pain in BLE, takes Gabapentin 300mg  bid, 200mg  qd, Tylenol 500mg  tid, Norco q6hr prn.c/o R 2nd 3rd hammer toes pain with walking, L 4th, 5th toes pain when touched.

## 2020-09-15 ENCOUNTER — Encounter: Payer: Self-pay | Admitting: Nurse Practitioner

## 2020-09-16 ENCOUNTER — Telehealth: Payer: Self-pay | Admitting: *Deleted

## 2020-09-16 ENCOUNTER — Other Ambulatory Visit: Payer: Self-pay | Admitting: Neurology

## 2020-09-16 DIAGNOSIS — R293 Abnormal posture: Secondary | ICD-10-CM | POA: Diagnosis not present

## 2020-09-16 DIAGNOSIS — R2681 Unsteadiness on feet: Secondary | ICD-10-CM | POA: Diagnosis not present

## 2020-09-16 DIAGNOSIS — R2689 Other abnormalities of gait and mobility: Secondary | ICD-10-CM | POA: Diagnosis not present

## 2020-09-16 DIAGNOSIS — G4733 Obstructive sleep apnea (adult) (pediatric): Secondary | ICD-10-CM

## 2020-09-16 DIAGNOSIS — Z9989 Dependence on other enabling machines and devices: Secondary | ICD-10-CM

## 2020-09-16 DIAGNOSIS — M6281 Muscle weakness (generalized): Secondary | ICD-10-CM | POA: Diagnosis not present

## 2020-09-16 DIAGNOSIS — R52 Pain, unspecified: Secondary | ICD-10-CM | POA: Diagnosis not present

## 2020-09-16 NOTE — Telephone Encounter (Signed)
Called jenie with [facility] and and made 30 day iniital cpap visit.  Download showed compliance.  Monday 09-20-20 at 1400.

## 2020-09-17 DIAGNOSIS — R293 Abnormal posture: Secondary | ICD-10-CM | POA: Diagnosis not present

## 2020-09-17 DIAGNOSIS — M6281 Muscle weakness (generalized): Secondary | ICD-10-CM | POA: Diagnosis not present

## 2020-09-17 DIAGNOSIS — R2681 Unsteadiness on feet: Secondary | ICD-10-CM | POA: Diagnosis not present

## 2020-09-17 DIAGNOSIS — R52 Pain, unspecified: Secondary | ICD-10-CM | POA: Diagnosis not present

## 2020-09-17 DIAGNOSIS — R2689 Other abnormalities of gait and mobility: Secondary | ICD-10-CM | POA: Diagnosis not present

## 2020-09-20 ENCOUNTER — Encounter: Payer: Self-pay | Admitting: Nurse Practitioner

## 2020-09-20 ENCOUNTER — Ambulatory Visit: Payer: Self-pay | Admitting: Adult Health

## 2020-09-20 ENCOUNTER — Non-Acute Institutional Stay (SKILLED_NURSING_FACILITY): Payer: Medicare Other | Admitting: Nurse Practitioner

## 2020-09-20 DIAGNOSIS — R339 Retention of urine, unspecified: Secondary | ICD-10-CM | POA: Diagnosis not present

## 2020-09-20 DIAGNOSIS — K219 Gastro-esophageal reflux disease without esophagitis: Secondary | ICD-10-CM

## 2020-09-20 DIAGNOSIS — G629 Polyneuropathy, unspecified: Secondary | ICD-10-CM

## 2020-09-20 DIAGNOSIS — L0232 Furuncle of buttock: Secondary | ICD-10-CM | POA: Insufficient documentation

## 2020-09-20 DIAGNOSIS — G250 Essential tremor: Secondary | ICD-10-CM

## 2020-09-20 DIAGNOSIS — F339 Major depressive disorder, recurrent, unspecified: Secondary | ICD-10-CM | POA: Diagnosis not present

## 2020-09-20 DIAGNOSIS — G4752 REM sleep behavior disorder: Secondary | ICD-10-CM

## 2020-09-20 NOTE — Assessment & Plan Note (Signed)
Neuropathic pain in BLE, takes Gabapentin 300mg  bid, 200mg  qd, Tylenol 500mg  tid, Norco q6hr prn.c/o R 2nd 3rd hammer toes pain with walking, L 4th, 5th toes pain when touched, right lower back to the right leg pain when sitting in w/c for a long period of time.

## 2020-09-20 NOTE — Assessment & Plan Note (Signed)
Foley chronic, f/u Urology 06/10/20. 

## 2020-09-20 NOTE — Assessment & Plan Note (Signed)
Warm compress 20 minu/eah, 4x/day x 10 days. Will start Doxycycline 100mg  bid x 7 days. Hx of C-diff, monitor for diarrhea. May consider [facility] or surgeon if not healing.

## 2020-09-20 NOTE — Assessment & Plan Note (Signed)
GERD, stable, on Pantoprazole 40mg qd.  

## 2020-09-20 NOTE — Assessment & Plan Note (Signed)
Essential tremor, stable, on Propranolol 10mg  qd.

## 2020-09-20 NOTE — Progress Notes (Signed)
Location:   SNF [place] Room Number: 26 Place of Service:  SNF (31) Provider: [facility] Lyonel Morejon NP  [name], MD  Patient Care Team: [name], MD as PCP - General (Internal Medicine) [name], MD (Hematology and Oncology) [name], MD as Attending Physician (Urology) [name], MD (Gastroenterology)  Extended Emergency Contact Information Primary Emergency Contact: Aizen, Duval Mobile Phone: [phone] Relation: Son Preferred language: English Interpreter needed? No Secondary Emergency Contact: Rabago,Janet H Address: [address]          [address]          [place], [place] [address] [name] of [place] Phone: [phone] Mobile Phone: [phone] Relation: Spouse  Code Status:  DNR Goals of care: Advanced Directive information Advanced Directives 09/14/2020  Does Patient Have a Medical Advance Directive? Yes  Type of Paramedic of [place];Living will;Out of facility DNR (pink MOST or yellow form)  Does patient want to make changes to medical advance directive? No - Patient declined  Copy of [place] in Chart? Yes - validated most recent copy scanned in chart (See row information)  Would patient like information on creating a medical advance directive? -  Pre-existing out of facility DNR order (yellow form or pink MOST form) Yellow form placed in chart (order not valid for inpatient use);Pink MOST form placed in chart (order not valid for inpatient use)     Chief Complaint  Patient presents with  . Acute Visit    Pain left buttock    HPI:  Pt is a 86 y.o. male seen today for an acute visit for the left buttock at the intergluteal aspect near anal, induration size of large marble, tender, warmth, and redness noted.   Hx ofRLS/REM, stable, on Requip 2mg  qd.  Essential tremor, stable, on Propranolol 10mg  qd.  GERD, stable, on Pantoprazole  40mg  qd.  Neuropathic pain in BLE, takes Gabapentin 300mg  bid, 200mg  qd, Tylenol 500mg  tid, Norco q6hr prn.c/o R 2nd 3rd hammer toes pain with walking, L 4th, 5th toes pain when touched, right lower back to the right leg pain when sitting in w/c for a long period of time.  His mood is stable, on Wellbutrin 150mg  bid. Foley chronic, f/u Urology 06/10/20.          Past Medical History:  Diagnosis Date  . Anemia   . Arthritis   . AVM (arteriovenous malformation)   . Barrett's esophagus   . BPH (benign prostatic hyperplasia)   . Cataract   . Constipation   . CPAP (continuous positive airway pressure) dependence   . Depression   . Diverticulosis    diverticular bleeding  . Diverticulosis   . GERD (gastroesophageal reflux disease)   . History of radiation therapy   . HOH (hard of hearing)    wears hearing aids  . Hyperlipidemia   . Hypoglycemia   . Lymphocytosis 06/2012  . Mastodynia   . Murmur    slight  . Nasal congestion   . Osteoporosis   . Osteoporosis   . Prostate cancer ([place])   . Sleep apnea    wears CPAP  . Urinary retention with incomplete bladder emptying    pt uses bathroom and within 30 mins needs to go again  . Vitamin D deficiency    Past Surgical History:  Procedure Laterality Date  . AMPUTATION TOE Right 09/20/2017   Procedure: Right 4th toe amputation;  Surgeon: Wylene Simmer, MD;  Location: [place];  Service: Orthopedics;  Laterality: Right;  foot block  . AMPUTATION TOE Right 02/05/2019   Procedure: RIGHT GREAT TOE AMPUTATION;  Surgeon: Leandrew Koyanagi, MD;  Location: [place];  Service: Orthopedics;  Laterality: Right;  . BACK SURGERY  04/2013  . back surgury     f  . BLEPHAROPLASTY Bilateral 02/23/15  . CATARACT EXTRACTION    . COLONOSCOPY    . COLONOSCOPY N/A 10/02/2017   Procedure: COLONOSCOPY;  Surgeon: Doran Stabler, MD;  Location: [facility];  Service:  Gastroenterology;  Laterality: N/A;  . COLONOSCOPY WITH PROPOFOL N/A 10/01/2017   Procedure: COLONOSCOPY WITH PROPOFOL;  Surgeon: Doran Stabler, MD;  Location: [facility];  Service: Gastroenterology;  Laterality: N/A;  . COLONOSCOPY WITH PROPOFOL N/A 03/07/2018   Procedure: COLONOSCOPY WITH PROPOFOL;  Surgeon: [name], MD;  Location: [facility];  Service: Endoscopy;  Laterality: N/A;  . ESOPHAGOGASTRODUODENOSCOPY (EGD) WITH PROPOFOL N/A 10/01/2017   Procedure: ESOPHAGOGASTRODUODENOSCOPY (EGD) WITH PROPOFOL;  Surgeon: Doran Stabler, MD;  Location: [facility];  Service: Gastroenterology;  Laterality: N/A;  . EYE SURGERY Bilateral   . FINGER SURGERY Right    right thumb  . HARDWARE REMOVAL Right 05/15/2016   Procedure: Right Lumbar Two-Lumbar Five Removal of Hardware;  Surgeon: Kristeen Miss, MD;  Location: [facility];  Service: Neurosurgery;  Laterality: Right;  Right L2-5 removal of hardware  . INGUINAL HERNIA REPAIR     x4  . IR RADIOLOGIST EVAL & MGMT  07/10/2018  . IR RADIOLOGIST EVAL & MGMT  09/05/2018  . IR SACROPLASTY BILATERAL  07/15/2018  . REPAIR SPIGELIAN HERNIA  2011  . [place] ,2014  . TONSILLECTOMY    . UPPER GASTROINTESTINAL ENDOSCOPY    . VASECTOMY      Allergies  Allergen Reactions  . Celebrex [Celecoxib] Other (See Comments)    Created stomach ulcers.  Yvette Rack [Cyclobenzaprine] Other (See Comments)    Caught in hiatal hernia and caused extreme pain    Allergies as of 09/20/2020      Reactions   Celebrex [celecoxib] Other (See Comments)   Created stomach ulcers.   Flexeril [cyclobenzaprine] Other (See Comments)   Caught in hiatal hernia and caused extreme pain      Medication List       Accurate as of September 20, 2020 11:59 PM. If you have any questions, ask your nurse or doctor.        acetaminophen 500 MG tablet Commonly known as: TYLENOL Take 500 mg by mouth 3 (three) times daily.   buPROPion 150 MG 12 hr  tablet Commonly known as: WELLBUTRIN SR Take 150 mg by mouth 2 (two) times daily.   CENTRUM ADULTS PO Take 1 tablet by mouth daily.   PRESERVISION AREDS 2 PO Take 1 capsule by mouth 2 (two) times daily.   cholecalciferol 1000 units tablet Commonly known as: VITAMIN D Take 1,000 Units by mouth daily.   cyanocobalamin 1000 MCG tablet Take 1,000 mcg by mouth daily.   diclofenac Sodium 1 % Gel Commonly known as: VOLTAREN Apply 2 g topically 4 (four) times daily.   docusate sodium 100 MG capsule Commonly known as: COLACE Take 100 mg by mouth daily.   gabapentin 300 MG capsule Commonly known as: NEURONTIN Take 300 mg by mouth 2 (two) times daily. due to numbness and tingling   gabapentin 100 MG capsule Commonly known as: NEURONTIN Take 200 mg  by mouth every morning.   HYDROcodone-acetaminophen 5-325 MG tablet Commonly known as: NORCO/VICODIN Take one tablet by mouth every 6 hours as needed for moderate pain. Do not exceed 4gm of Tylenol in 24 hours.   Lactase Enzyme 3000 units tablet Generic drug: lactase Take 4,500 Units by mouth 3 (three) times daily with meals.   lovastatin 20 MG tablet Commonly known as: MEVACOR Take 20 mg by mouth at bedtime.   pantoprazole 40 MG tablet Commonly known as: PROTONIX Take 40 mg by mouth every morning.   polyethylene glycol 17 g packet Commonly known as: MIRALAX / GLYCOLAX Take 17 g by mouth daily as needed.   propranolol 10 MG tablet Commonly known as: INDERAL Take 10 mg by mouth daily.   Refresh Repair 0.5-0.9 % ophthalmic solution Generic drug: carboxymethylcellul-glycerin Place 1 drop into both eyes 3 (three) times daily.   rOPINIRole 1 MG tablet Commonly known as: REQUIP TAKE 2 TABLETS(2 MG) BY MOUTH AT BEDTIME   zinc oxide 20 % ointment Apply 1 application topically as needed for irritation.       Review of Systems  Constitutional: Negative for fatigue, fever and unexpected weight change.  HENT: Positive for  hearing loss. Negative for congestion and voice change.   Eyes: Negative for visual disturbance.  Respiratory: Negative for cough and shortness of breath.   Cardiovascular: Negative for leg swelling.  Gastrointestinal: Negative for diarrhea.       Fecal incontinence  Genitourinary: Positive for difficulty urinating. Negative for dysuria.       Foley  Musculoskeletal: Positive for arthralgias, back pain, gait problem and neck pain.       R 2nd 3rd hammer toes pain with walking, L 4th, 5th toes pain when touched, right lower back to the right leg pain when sitting in w/c for a long period of time.   Skin: Negative for color change.        the left buttock at the intergluteal aspect near anal, induration size of large marble, tender, warmth, and redness noted.    Neurological: Positive for tremors. Negative for speech difficulty.       Memory lapses. Essential tremor, peripheral neuropathy, RLS  Psychiatric/Behavioral: Negative for behavioral problems and sleep disturbance. The patient is not nervous/anxious.     Immunization History  Administered Date(s) Administered  . Influenza, High Dose Seasonal PF 09/17/2019  . Influenza-Unspecified 09/16/2018, 09/07/2020  . Moderna SARS-COVID-2 Vaccination 12/08/2019, 01/05/2020  . Pneumococcal Conjugate-13 12/29/2013  . Pneumococcal Polysaccharide-23 09/08/2005  . Tdap 05/25/2005, 08/08/2018  . Zoster Recombinat (Shingrix) 10/17/2017, 01/25/2018   Pertinent  Health Maintenance Due  Topic Date Due  . OPHTHALMOLOGY EXAM  Never done  . URINE MICROALBUMIN  Never done  . FOOT EXAM  09/28/2019  . INFLUENZA VACCINE  Completed  . PNA vac Low Risk Adult  Completed  . HEMOGLOBIN A1C  Discontinued   Fall Risk  01/01/2019 09/24/2018 01/04/2017 10/06/2016 09/29/2016  Falls in the past year? 0 No No No No   Functional Status Survey:    Vitals:   09/20/20 1447  BP: 115/63  Pulse: (!) 58  Resp: 20  Temp: 97.6 F (36.4 C)  SpO2: 97%  Weight: 158 lb  8 oz (71.9 kg)  Height: 5\' 6"  (1.676 m)   Body mass index is 25.58 kg/m. Physical Exam Vitals and nursing note reviewed.  Constitutional:      Appearance: Normal appearance.  HENT:     Head: Normocephalic and atraumatic.  Eyes:  Extraocular Movements: Extraocular movements intact.     Conjunctiva/sclera: Conjunctivae normal.     Pupils: Pupils are equal, round, and reactive to light.  Cardiovascular:     Rate and Rhythm: Normal rate and regular rhythm.     Heart sounds: Murmur heard.      Comments: Weak DP pulse on the right Pulmonary:     Effort: Pulmonary effort is normal.     Breath sounds: No rales.  Abdominal:     General: Bowel sounds are normal.     Palpations: Abdomen is soft.     Tenderness: There is no abdominal tenderness.  Genitourinary:    Comments: Foley  Musculoskeletal:        General: Tenderness present.     Cervical back: Normal range of motion and neck supple.     Right lower leg: No edema.     Left lower leg: No edema.     Comments:  S/p the right great and 4th toe amputation. R 2nd 3rd hammer toes pain with walking, L 4th, 5th toes pain when touched, right lower back to the right leg pain when sitting in w/c for a long period of time.   Skin:    General: Skin is warm and dry.     Comments:  the left buttock at the intergluteal aspect near anal, induration size of a large marble, tender, warmth, and redness noted.    Neurological:     General: No focal deficit present.     Mental Status: He is alert. Mental status is at baseline.     Gait: Gait abnormal.     Comments: Oriented to person, place.   Psychiatric:        Mood and Affect: Mood normal.        Behavior: Behavior normal.        Thought Content: Thought content normal.        Judgment: Judgment normal.     Labs reviewed: Recent Labs    01/15/20 0240 01/15/20 0240 01/16/20 0243 01/16/20 0243 01/17/20 0241 01/17/20 0241 02/09/20 0000 05/06/20 0000 08/16/20 0000  NA 135   < >  135   < > 135  --  138 139 139  K 3.9   < > 4.2   < > 3.8   < > 4.1 4.2 4.0  CL 111   < > 112*   < > 108   < > 105 106 108  CO2 16*   < > 18*   < > 20*   < > 24* 25* 24*  GLUCOSE 98  --  126*  --  100*  --   --   --   --   BUN 11   < > 11   < > 12  --  17 15 15   CREATININE 0.74   < > 0.70   < > 0.61  --  0.7 0.8 0.8  CALCIUM 7.6*   < > 7.6*   < > 7.7*   < > 8.9 8.8 8.6*  MG 1.8  --  1.8  --  1.7  --   --   --   --    < > = values in this interval not displayed.   Recent Labs    01/12/20 2110 01/12/20 2110 02/09/20 0000 05/06/20 0000 08/16/20 0000  AST 38   < > 31 27 27   ALT 18   < > 10 9* 10  ALKPHOS 76   < > 98  91 91  BILITOT 0.6  --   --   --   --   PROT 6.5  --   --   --   --   ALBUMIN 3.0*   < > 3.4* 3.5 3.7   < > = values in this interval not displayed.   Recent Labs    01/15/20 0240 01/15/20 0240 01/16/20 0243 01/16/20 0243 01/17/20 0241 01/17/20 0241 02/09/20 0000 05/06/20 0000 08/16/20 0000  WBC 18.0*   < > 16.8*   < > 11.6*  --  6.6 7.8 6.5  NEUTROABS 15.7*   < > 14.4*  --  9.4*  --  4,204  --   --   HGB 11.2*   < > 10.7*   < > 10.9*   < > 13.0* 12.9* 12.9*  HCT 33.9*   < > 33.2*   < > 34.0*   < > 41 39* 39*  MCV 91.1  --  93.0  --  91.6  --   --   --   --   PLT 256   < > 252   < > 256   < > 272 224 199   < > = values in this interval not displayed.   Lab Results  Component Value Date   TSH 2.38 02/09/2020   Lab Results  Component Value Date   HGBA1C 5.2 08/29/2018   Lab Results  Component Value Date   CHOL 158 08/29/2018   HDL 34 (A) 05/06/2020   LDLCALC 103 05/06/2020   TRIG 154 05/06/2020    Significant Diagnostic Results in last 30 days:  No results found.  Assessment/Plan Boil of buttock Warm compress 20 minu/eah, 4x/day x 10 days. Will start Doxycycline 100mg  bid x 7 days. Hx of C-diff, monitor for diarrhea. May consider I+D SNF [facility] or surgeon if not healing.   REM sleep behavior disorder Hx ofRLS/REM, stable, on Requip 2mg  qd.     Essential tremor Essential tremor, stable, on Propranolol 10mg  qd.    GERD GERD, stable, on Pantoprazole 40mg  qd.    Neuropathy Neuropathic pain in BLE, takes Gabapentin 300mg  bid, 200mg  qd, Tylenol 500mg  tid, Norco q6hr prn.c/o R 2nd 3rd hammer toes pain with walking, L 4th, 5th toes pain when touched, right lower back to the right leg pain when sitting in w/c for a long period of time.    Depression, recurrent ([place]) His mood is stable, on Wellbutrin 150mg  bid.  Urinary retention Foley chronic, f/u Urology 06/10/20.             Family/ staff Communication: plan of care reviewed with the patient and charge nurse.   Labs/tests ordered:  None  Time spend 35 minutes.

## 2020-09-20 NOTE — Assessment & Plan Note (Signed)
Hx ofRLS/REM, stable, on Requip 2mg  qd.

## 2020-09-20 NOTE — Assessment & Plan Note (Signed)
His mood is stable, on Wellbutrin 150mg  bid.

## 2020-09-21 ENCOUNTER — Encounter: Payer: Self-pay | Admitting: Nurse Practitioner

## 2020-09-23 ENCOUNTER — Other Ambulatory Visit: Payer: Self-pay | Admitting: *Deleted

## 2020-09-23 DIAGNOSIS — R293 Abnormal posture: Secondary | ICD-10-CM | POA: Diagnosis not present

## 2020-09-23 DIAGNOSIS — M6281 Muscle weakness (generalized): Secondary | ICD-10-CM | POA: Diagnosis not present

## 2020-09-23 DIAGNOSIS — R2689 Other abnormalities of gait and mobility: Secondary | ICD-10-CM | POA: Diagnosis not present

## 2020-09-23 DIAGNOSIS — R2681 Unsteadiness on feet: Secondary | ICD-10-CM | POA: Diagnosis not present

## 2020-09-23 DIAGNOSIS — R52 Pain, unspecified: Secondary | ICD-10-CM | POA: Diagnosis not present

## 2020-09-23 MED ORDER — HYDROCODONE-ACETAMINOPHEN 5-325 MG PO TABS: ORAL_TABLET | ORAL | 0 refills | Status: AC

## 2020-09-23 NOTE — Telephone Encounter (Signed)
[place] Pended Rx and sent to Dr. Lyndel Safe for approval.

## 2020-09-24 DIAGNOSIS — R2681 Unsteadiness on feet: Secondary | ICD-10-CM | POA: Diagnosis not present

## 2020-09-24 DIAGNOSIS — R293 Abnormal posture: Secondary | ICD-10-CM | POA: Diagnosis not present

## 2020-09-24 DIAGNOSIS — R2689 Other abnormalities of gait and mobility: Secondary | ICD-10-CM | POA: Diagnosis not present

## 2020-09-24 DIAGNOSIS — M6281 Muscle weakness (generalized): Secondary | ICD-10-CM | POA: Diagnosis not present

## 2020-09-24 DIAGNOSIS — R52 Pain, unspecified: Secondary | ICD-10-CM | POA: Diagnosis not present

## 2020-09-28 ENCOUNTER — Non-Acute Institutional Stay (SKILLED_NURSING_FACILITY): Payer: Medicare Other | Admitting: Nurse Practitioner

## 2020-09-28 ENCOUNTER — Encounter: Payer: Self-pay | Admitting: Nurse Practitioner

## 2020-09-28 DIAGNOSIS — G4752 REM sleep behavior disorder: Secondary | ICD-10-CM

## 2020-09-28 DIAGNOSIS — M5442 Lumbago with sciatica, left side: Secondary | ICD-10-CM | POA: Diagnosis not present

## 2020-09-28 DIAGNOSIS — M5441 Lumbago with sciatica, right side: Secondary | ICD-10-CM

## 2020-09-28 DIAGNOSIS — G8929 Other chronic pain: Secondary | ICD-10-CM | POA: Diagnosis not present

## 2020-09-28 DIAGNOSIS — K219 Gastro-esophageal reflux disease without esophagitis: Secondary | ICD-10-CM

## 2020-09-28 DIAGNOSIS — C61 Malignant neoplasm of prostate: Secondary | ICD-10-CM | POA: Diagnosis not present

## 2020-09-28 DIAGNOSIS — F339 Major depressive disorder, recurrent, unspecified: Secondary | ICD-10-CM | POA: Diagnosis not present

## 2020-09-28 DIAGNOSIS — R339 Retention of urine, unspecified: Secondary | ICD-10-CM | POA: Diagnosis not present

## 2020-09-28 DIAGNOSIS — N62 Hypertrophy of breast: Secondary | ICD-10-CM

## 2020-09-28 DIAGNOSIS — G250 Essential tremor: Secondary | ICD-10-CM | POA: Diagnosis not present

## 2020-09-28 DIAGNOSIS — L0232 Furuncle of buttock: Secondary | ICD-10-CM

## 2020-09-28 NOTE — Assessment & Plan Note (Signed)
Improved on Doxy, warm compress, there is pus point, lanced and drainage. Observe.

## 2020-09-28 NOTE — Assessment & Plan Note (Signed)
Essential tremor, stable, on Propranolol 10mg  qd.

## 2020-09-28 NOTE — Assessment & Plan Note (Signed)
Foley chronic, f/u Urology 06/10/20. 

## 2020-09-28 NOTE — Assessment & Plan Note (Signed)
GERD, stable, on Pantoprazole 40mg qd.  

## 2020-09-28 NOTE — Assessment & Plan Note (Signed)
Hx ofRLS/REM, stable, on Requip 2mg  qd.

## 2020-09-28 NOTE — Assessment & Plan Note (Signed)
His mood is stable, on Wellbutrin 150mg  bid.

## 2020-09-28 NOTE — Assessment & Plan Note (Signed)
Neuropathic pain in BLE,takesGabapentin 300mg  bid,200mg  qd, Tylenol 500mg  tid, Norco q6hr prn.c/o R 2nd 3rd hammer toes pain with walking, L 4th, 5th toes pain when touched, right lower back to the right leg pain when sitting in w/c for a long period of time.

## 2020-09-28 NOTE — Progress Notes (Signed)
Location:  [facility] Number: 26 Place of Service:  SNF [phone]) Provider: Lennie Odor Denica Web NP  [name], MD  Patient Care Team: [name], MD as PCP - General (Internal Medicine) [name], MD (Hematology and Oncology) [name], MD as Attending Physician (Urology) [name], MD (Gastroenterology)  Extended Emergency Contact Information Primary Emergency Contact: Castor, Gittleman Mobile Phone: [phone] Relation: Son Preferred language: English Interpreter needed? No Secondary Emergency Contact: Orndoff,Janet H Address: [address]          [address]          [place], [place] [address] [name] of [place] Phone: [phone] Mobile Phone: [phone] Relation: Spouse  Code Status:  DNR Goals of care: Advanced Directive information Advanced Directives 09/14/2020  Does Patient Have a Medical Advance Directive? Yes  Type of Paramedic of [place];Living will;Out of facility DNR (pink MOST or yellow form)  Does patient want to make changes to medical advance directive? No - Patient declined  Copy of [place] in Chart? Yes - validated most recent copy scanned in chart (See row information)  Would patient like information on creating a medical advance directive? -  Pre-existing out of facility DNR order (yellow form or pink MOST form) Yellow form placed in chart (order not valid for inpatient use);Pink MOST form placed in chart (order not valid for inpatient use)     Chief Complaint  Patient presents with  . Acute Visit    Nipple drainage    HPI:  Pt is a 86 y.o. male seen today for an acute visit for reported left nipple drainage which is not present upon my examination even if with nipple pressed. Hx of Gynecomastia, [place] 09/05/20 US breasts no definite ultrasonic lesions. Denied increased tenderness, warmth, or swelling.   Hx ofRLS/REM, stable, on Requip 2mg  qd.   Essential tremor, stable, on Propranolol 10mg  qd.  GERD, stable, on Pantoprazole 40mg  qd.  Neuropathic pain in BLE,takesGabapentin 300mg  bid,200mg  qd, Tylenol 500mg  tid, Norco q6hr prn.c/o R 2nd 3rd hammer toes pain with walking, L 4th, 5th toes pain when touched, right lower back to the right leg pain when sitting in w/c for a long period of time. His mood is stable, on Wellbutrin 150mg  bid. Foley chronic, f/u Urology 06/10/20.  Past Medical History:  Diagnosis Date  . Anemia   . Arthritis   . AVM (arteriovenous malformation)   . Barrett's esophagus   . BPH (benign prostatic hyperplasia)   . Cataract   . Constipation   . CPAP (continuous positive airway pressure) dependence   . Depression   . Diverticulosis    diverticular bleeding  . Diverticulosis   . GERD (gastroesophageal reflux disease)   . History of radiation therapy   . HOH (hard of hearing)    wears hearing aids  . Hyperlipidemia   . Hypoglycemia   . Lymphocytosis 06/2012  . Mastodynia   . Murmur    slight  . Nasal congestion   . Osteoporosis   . Osteoporosis   . Prostate cancer ([place])   . Sleep apnea    wears CPAP  . Urinary retention with incomplete bladder emptying    pt uses bathroom and within 30 mins needs to go again  . Vitamin D deficiency    Past Surgical History:  Procedure Laterality Date  . AMPUTATION TOE Right 09/20/2017   Procedure: Right 4th toe amputation;  Surgeon: Wylene Simmer,  MD;  Location: [facility];  Service: Orthopedics;  Laterality: Right;  foot block  . AMPUTATION TOE Right 02/05/2019   Procedure: RIGHT GREAT TOE AMPUTATION;  Surgeon: Leandrew Koyanagi, MD;  Location: [facility];  Service: Orthopedics;  Laterality: Right;  . BACK SURGERY  04/2013  . back surgury     f  . BLEPHAROPLASTY Bilateral 02/23/15  . CATARACT EXTRACTION    . COLONOSCOPY    . COLONOSCOPY N/A 10/02/2017    Procedure: COLONOSCOPY;  Surgeon: Doran Stabler, MD;  Location: [facility];  Service: Gastroenterology;  Laterality: N/A;  . COLONOSCOPY WITH PROPOFOL N/A 10/01/2017   Procedure: COLONOSCOPY WITH PROPOFOL;  Surgeon: Doran Stabler, MD;  Location: [facility];  Service: Gastroenterology;  Laterality: N/A;  . COLONOSCOPY WITH PROPOFOL N/A 03/07/2018   Procedure: COLONOSCOPY WITH PROPOFOL;  Surgeon: [name], MD;  Location: [facility];  Service: Endoscopy;  Laterality: N/A;  . ESOPHAGOGASTRODUODENOSCOPY (EGD) WITH PROPOFOL N/A 10/01/2017   Procedure: ESOPHAGOGASTRODUODENOSCOPY (EGD) WITH PROPOFOL;  Surgeon: Doran Stabler, MD;  Location: [facility];  Service: Gastroenterology;  Laterality: N/A;  . EYE SURGERY Bilateral   . FINGER SURGERY Right    right thumb  . HARDWARE REMOVAL Right 05/15/2016   Procedure: Right Lumbar Two-Lumbar Five Removal of Hardware;  Surgeon: Kristeen Miss, MD;  Location: [facility];  Service: Neurosurgery;  Laterality: Right;  Right L2-5 removal of hardware  . INGUINAL HERNIA REPAIR     x4  . IR RADIOLOGIST EVAL & MGMT  07/10/2018  . IR RADIOLOGIST EVAL & MGMT  09/05/2018  . IR SACROPLASTY BILATERAL  07/15/2018  . REPAIR SPIGELIAN HERNIA  2011  . [place] ,2014  . TONSILLECTOMY    . UPPER GASTROINTESTINAL ENDOSCOPY    . VASECTOMY      Allergies  Allergen Reactions  . Celebrex [Celecoxib] Other (See Comments)    Created stomach ulcers.  Yvette Rack [Cyclobenzaprine] Other (See Comments)    Caught in hiatal hernia and caused extreme pain    Allergies as of 09/28/2020      Reactions   Celebrex [celecoxib] Other (See Comments)   Created stomach ulcers.   Flexeril [cyclobenzaprine] Other (See Comments)   Caught in hiatal hernia and caused extreme pain      Medication List       Accurate as of September 28, 2020 11:59 PM. If you have any questions, ask your nurse or doctor.        acetaminophen 500 MG tablet Commonly known  as: TYLENOL Take 500 mg by mouth 3 (three) times daily.   buPROPion 150 MG 12 hr tablet Commonly known as: WELLBUTRIN SR Take 150 mg by mouth 2 (two) times daily.   CENTRUM ADULTS PO Take 1 tablet by mouth daily.   PRESERVISION AREDS 2 PO Take 1 capsule by mouth 2 (two) times daily.   cholecalciferol 1000 units tablet Commonly known as: VITAMIN D Take 1,000 Units by mouth daily.   cyanocobalamin 1000 MCG tablet Take 1,000 mcg by mouth daily.   diclofenac Sodium 1 % Gel Commonly known as: VOLTAREN Apply 2 g topically 4 (four) times daily.   docusate sodium 100 MG capsule Commonly known as: COLACE Take 100 mg by mouth daily.   gabapentin 300 MG capsule Commonly known as: NEURONTIN Take 300 mg by mouth 2 (two) times daily. due to numbness and tingling   gabapentin 100 MG capsule Commonly known as: NEURONTIN Take  200 mg by mouth every morning.   HYDROcodone-acetaminophen 5-325 MG tablet Commonly known as: NORCO/VICODIN Take one tablet by mouth every 6 hours as needed for moderate pain. Do not exceed 4gm of Tylenol in 24 hours.   Lactase Enzyme 3000 units tablet Generic drug: lactase Take 4,500 Units by mouth 3 (three) times daily with meals.   lovastatin 20 MG tablet Commonly known as: MEVACOR Take 20 mg by mouth at bedtime.   pantoprazole 40 MG tablet Commonly known as: PROTONIX Take 40 mg by mouth every morning.   polyethylene glycol 17 g packet Commonly known as: MIRALAX / GLYCOLAX Take 17 g by mouth daily as needed.   propranolol 10 MG tablet Commonly known as: INDERAL Take 10 mg by mouth daily.   Refresh Repair 0.5-0.9 % ophthalmic solution Generic drug: carboxymethylcellul-glycerin Place 1 drop into both eyes 3 (three) times daily.   rOPINIRole 1 MG tablet Commonly known as: REQUIP TAKE 2 TABLETS(2 MG) BY MOUTH AT BEDTIME   zinc oxide 20 % ointment Apply 1 application topically as needed for irritation.       Review of Systems    Constitutional: Negative for activity change, appetite change and fever.  HENT: Positive for hearing loss. Negative for congestion and voice change.   Eyes: Negative for visual disturbance.  Respiratory: Negative for cough and shortness of breath.   Cardiovascular: Negative for leg swelling.  Gastrointestinal: Negative for abdominal pain and diarrhea.       Fecal incontinence  Genitourinary: Positive for difficulty urinating. Negative for dysuria.       Foley  Musculoskeletal: Positive for arthralgias, back pain, gait problem and neck pain.       R 2nd 3rd hammer toes pain with walking, L 4th, 5th toes pain when touched, right lower back to the right leg pain when sitting in w/c for a long period of time.   Skin: Negative for color change.        the left buttock at the intergluteal aspect near anal, induration size of large marble, tender, warmth, and redness improved, a pus point in the center.    Neurological: Positive for tremors. Negative for speech difficulty, light-headedness and headaches.       Memory lapses. Essential tremor, peripheral neuropathy, RLS  Psychiatric/Behavioral: Negative for behavioral problems and sleep disturbance. The patient is not nervous/anxious.     Immunization History  Administered Date(s) Administered  . Influenza, High Dose Seasonal PF 09/17/2019  . Influenza-Unspecified 09/16/2018, 09/07/2020  . Moderna SARS-COVID-2 Vaccination 12/08/2019, 01/05/2020  . Pneumococcal Conjugate-13 12/29/2013  . Pneumococcal Polysaccharide-23 09/08/2005  . Tdap 05/25/2005, 08/08/2018  . Zoster Recombinat (Shingrix) 10/17/2017, 01/25/2018   Pertinent  Health Maintenance Due  Topic Date Due  . OPHTHALMOLOGY EXAM  Never done  . URINE MICROALBUMIN  Never done  . FOOT EXAM  09/28/2019  . INFLUENZA VACCINE  Completed  . PNA vac Low Risk Adult  Completed  . HEMOGLOBIN A1C  Discontinued   Fall Risk  01/01/2019 09/24/2018 01/04/2017 10/06/2016 09/29/2016  Falls in the  past year? 0 No No No No   Functional Status Survey:    Vitals:   09/28/20 1110  BP: 123/60  Pulse: 62  Resp: 18  Temp: 97.8 F (36.6 C)  SpO2: 97%  Weight: 158 lb 8 oz (71.9 kg)  Height: 5\' 6"  (1.676 m)   Body mass index is 25.58 kg/m. Physical Exam Vitals and nursing note reviewed.  Constitutional:      Appearance: Normal appearance.  HENT:     Head: Normocephalic and atraumatic.  Eyes:     Extraocular Movements: Extraocular movements intact.     Conjunctiva/sclera: Conjunctivae normal.     Pupils: Pupils are equal, round, and reactive to light.  Cardiovascular:     Rate and Rhythm: Normal rate and regular rhythm.     Heart sounds: Murmur heard.      Comments: Weak DP pulse on the right Pulmonary:     Effort: Pulmonary effort is normal.     Breath sounds: No rales.  Abdominal:     General: Bowel sounds are normal.     Palpations: Abdomen is soft.     Tenderness: There is no abdominal tenderness.  Genitourinary:    Comments: Foley  Musculoskeletal:        General: Tenderness present.     Cervical back: Normal range of motion and neck supple.     Right lower leg: No edema.     Left lower leg: No edema.     Comments:  S/p the right great and 4th toe amputation. R 2nd 3rd hammer toes pain with walking, L 4th, 5th toes pain when touched, right lower back to the right leg pain when sitting in w/c for a long period of time.   Skin:    General: Skin is warm and dry.     Comments:  the left buttock at the intergluteal aspect near anal, induration size of a large marble, tender, warmth, and redness improved, center pus point was I+D. Left breast gynecomastia, no increased tenderness, warmth, or nipple drainage noted upon my examination.    Neurological:     General: No focal deficit present.     Mental Status: He is alert. Mental status is at baseline.     Gait: Gait abnormal.     Comments: Oriented to person, place.   Psychiatric:        Mood and Affect: Mood  normal.        Behavior: Behavior normal.        Thought Content: Thought content normal.        Judgment: Judgment normal.     Labs reviewed: Recent Labs    01/15/20 0240 01/15/20 0240 01/16/20 0243 01/16/20 0243 01/17/20 0241 01/17/20 0241 02/09/20 0000 05/06/20 0000 08/16/20 0000  NA 135   < > 135   < > 135  --  138 139 139  K 3.9   < > 4.2   < > 3.8   < > 4.1 4.2 4.0  CL 111   < > 112*   < > 108   < > 105 106 108  CO2 16*   < > 18*   < > 20*   < > 24* 25* 24*  GLUCOSE 98  --  126*  --  100*  --   --   --   --   BUN 11   < > 11   < > 12  --  17 15 15   CREATININE 0.74   < > 0.70   < > 0.61  --  0.7 0.8 0.8  CALCIUM 7.6*   < > 7.6*   < > 7.7*   < > 8.9 8.8 8.6*  MG 1.8  --  1.8  --  1.7  --   --   --   --    < > = values in this interval not displayed.   Recent Labs    01/12/20 2110 01/12/20 2110  02/09/20 0000 05/06/20 0000 08/16/20 0000  AST 38   < > 31 27 27   ALT 18   < > 10 9* 10  ALKPHOS 76   < > 98 91 91  BILITOT 0.6  --   --   --   --   PROT 6.5  --   --   --   --   ALBUMIN 3.0*   < > 3.4* 3.5 3.7   < > = values in this interval not displayed.   Recent Labs    01/15/20 0240 01/15/20 0240 01/16/20 0243 01/16/20 0243 01/17/20 0241 01/17/20 0241 02/09/20 0000 05/06/20 0000 08/16/20 0000  WBC 18.0*   < > 16.8*   < > 11.6*  --  6.6 7.8 6.5  NEUTROABS 15.7*   < > 14.4*  --  9.4*  --  4,204  --   --   HGB 11.2*   < > 10.7*   < > 10.9*   < > 13.0* 12.9* 12.9*  HCT 33.9*   < > 33.2*   < > 34.0*   < > 41 39* 39*  MCV 91.1  --  93.0  --  91.6  --   --   --   --   PLT 256   < > 252   < > 256   < > 272 224 199   < > = values in this interval not displayed.   Lab Results  Component Value Date   TSH 2.38 02/09/2020   Lab Results  Component Value Date   HGBA1C 5.2 08/29/2018   Lab Results  Component Value Date   CHOL 158 08/29/2018   HDL 34 (A) 05/06/2020   LDLCALC 103 05/06/2020   TRIG 154 05/06/2020    Significant Diagnostic Results in last 30  days:  No results found.  Assessment/Plan Malignant neoplasm of prostate ([place])  reported left nipple drainage which is not present upon my examination even if with nipple pressed. Hx of Gynecomastia, [place] 09/05/20 US breasts no definite ultrasonic lesions. Denied increased tenderness, warmth, or swelling. Will observe.     Boil of buttock Improved on Doxy, warm compress, there is pus point, lanced and drainage. Observe.   Urinary retention Foley chronic, f/u Urology 06/10/20.   Depression, recurrent ([place]) His mood is stable, on Wellbutrin 150mg  bid.  Chronic bilateral low back pain with bilateral sciatica Neuropathic pain in BLE,takesGabapentin 300mg  bid,200mg  qd, Tylenol 500mg  tid, Norco q6hr prn.c/o R 2nd 3rd hammer toes pain with walking, L 4th, 5th toes pain when touched, right lower back to the right leg pain when sitting in w/c for a long period of time.  REM sleep behavior disorder Hx ofRLS/REM, stable, on Requip 2mg  qd.    Essential tremor Essential tremor, stable, on Propranolol 10mg  qd.    GERD GERD, stable, on Pantoprazole 40mg  qd.      Family/ staff Communication: plan of care reviewed with the patient and charge nurse.   Labs/tests ordered: none  Time spend 35 minutes.

## 2020-09-28 NOTE — Assessment & Plan Note (Signed)
reported left nipple drainage which is not present upon my examination even if with nipple pressed. Hx of Gynecomastia, [place] 09/05/20 US breasts no definite ultrasonic lesions. Denied increased tenderness, warmth, or swelling. Will observe.

## 2020-09-29 ENCOUNTER — Encounter: Payer: Self-pay | Admitting: Nurse Practitioner

## 2020-09-30 DIAGNOSIS — R293 Abnormal posture: Secondary | ICD-10-CM | POA: Diagnosis not present

## 2020-09-30 DIAGNOSIS — R52 Pain, unspecified: Secondary | ICD-10-CM | POA: Diagnosis not present

## 2020-09-30 DIAGNOSIS — M6281 Muscle weakness (generalized): Secondary | ICD-10-CM | POA: Diagnosis not present

## 2020-09-30 DIAGNOSIS — R2689 Other abnormalities of gait and mobility: Secondary | ICD-10-CM | POA: Diagnosis not present

## 2020-09-30 DIAGNOSIS — R2681 Unsteadiness on feet: Secondary | ICD-10-CM | POA: Diagnosis not present

## 2020-10-01 ENCOUNTER — Non-Acute Institutional Stay (SKILLED_NURSING_FACILITY): Payer: Medicare Other | Admitting: Nurse Practitioner

## 2020-10-01 ENCOUNTER — Encounter: Payer: Self-pay | Admitting: Nurse Practitioner

## 2020-10-01 DIAGNOSIS — G4752 REM sleep behavior disorder: Secondary | ICD-10-CM

## 2020-10-01 DIAGNOSIS — G629 Polyneuropathy, unspecified: Secondary | ICD-10-CM

## 2020-10-01 DIAGNOSIS — R339 Retention of urine, unspecified: Secondary | ICD-10-CM | POA: Diagnosis not present

## 2020-10-01 DIAGNOSIS — M6281 Muscle weakness (generalized): Secondary | ICD-10-CM | POA: Diagnosis not present

## 2020-10-01 DIAGNOSIS — K219 Gastro-esophageal reflux disease without esophagitis: Secondary | ICD-10-CM

## 2020-10-01 DIAGNOSIS — F339 Major depressive disorder, recurrent, unspecified: Secondary | ICD-10-CM

## 2020-10-01 DIAGNOSIS — R2689 Other abnormalities of gait and mobility: Secondary | ICD-10-CM | POA: Diagnosis not present

## 2020-10-01 DIAGNOSIS — H1131 Conjunctival hemorrhage, right eye: Secondary | ICD-10-CM | POA: Insufficient documentation

## 2020-10-01 DIAGNOSIS — R2681 Unsteadiness on feet: Secondary | ICD-10-CM | POA: Diagnosis not present

## 2020-10-01 DIAGNOSIS — R52 Pain, unspecified: Secondary | ICD-10-CM | POA: Diagnosis not present

## 2020-10-01 DIAGNOSIS — G250 Essential tremor: Secondary | ICD-10-CM

## 2020-10-01 DIAGNOSIS — R293 Abnormal posture: Secondary | ICD-10-CM | POA: Diagnosis not present

## 2020-10-01 NOTE — Assessment & Plan Note (Signed)
Neuropathic pain in BLE,takesGabapentin 300mg  bid,200mg  qd, Tylenol 500mg  tid, Norco q6hr prn.c/o R 2nd 3rd hammer toes pain with walking, L 4th, 5th toes pain when touched, right lower back to the right leg pain when sitting in w/c for a long period of time.

## 2020-10-01 NOTE — Progress Notes (Signed)
Location:    [facility] of Service:  SNF (31) Provider: Lennie Odor Joua Bake NP  [name], MD  Patient Care Team: [name], MD as PCP - General (Internal Medicine) [name], MD (Hematology and Oncology) [name], MD as Attending Physician (Urology) [name], MD (Gastroenterology)  Extended Emergency Contact Information Primary Emergency Contact: Franck, Vinal Mobile Phone: [phone] Relation: Son Preferred language: English Interpreter needed? No Secondary Emergency Contact: Kritikos,Janet H Address: [address]          [address]          [place], [place] [address] [name] of [place] Phone: [phone] Mobile Phone: [phone] Relation: Spouse  Code Status:  DNR Goals of care: Advanced Directive information Advanced Directives 09/14/2020  Does Patient Have a Medical Advance Directive? Yes  Type of Paramedic of [place];Living will;Out of facility DNR (pink MOST or yellow form)  Does patient want to make changes to medical advance directive? No - Patient declined  Copy of [place] in Chart? Yes - validated most recent copy scanned in chart (See row information)  Would patient like information on creating a medical advance directive? -  Pre-existing out of facility DNR order (yellow form or pink MOST form) Yellow form placed in chart (order not valid for inpatient use);Pink MOST form placed in chart (order not valid for inpatient use)     Chief Complaint  Patient presents with  . Acute Visit    Redness in the right eye.     HPI:  Pt is a 86 y.o. male seen today for an acute visit for redness in the right eye, denied pain or change of vision, felt scratchy at times, no discharge seen.     Hx ofRLS/REM, stable, on Requip 2mg  qd.  Essential tremor, stable, on Propranolol 10mg  qd.  GERD, stable, on Pantoprazole 40mg  qd.    Neuropathic pain in BLE,takesGabapentin 300mg  bid,200mg  qd, Tylenol 500mg  tid, Norco q6hr prn.c/o R 2nd 3rd hammer toes pain with walking, L 4th, 5th toes pain when touched, right lower back to the right leg pain when sitting in w/c for a long period of time. His mood is stable, on Wellbutrin 150mg  bid. Foley chronic, f/u Urology 06/10/20.  Past Medical History:  Diagnosis Date  . Anemia   . Arthritis   . AVM (arteriovenous malformation)   . Barrett's esophagus   . BPH (benign prostatic hyperplasia)   . Cataract   . Constipation   . CPAP (continuous positive airway pressure) dependence   . Depression   . Diverticulosis    diverticular bleeding  . Diverticulosis   . GERD (gastroesophageal reflux disease)   . History of radiation therapy   . HOH (hard of hearing)    wears hearing aids  . Hyperlipidemia   . Hypoglycemia   . Lymphocytosis 06/2012  . Mastodynia   . Murmur    slight  . Nasal congestion   . Osteoporosis   . Osteoporosis   . Prostate cancer ([place])   . Sleep apnea    wears CPAP  . Urinary retention with incomplete bladder emptying    pt uses bathroom and within 30 mins needs to go again  . Vitamin D deficiency    Past Surgical History:  Procedure Laterality Date  . AMPUTATION TOE Right 09/20/2017   Procedure: Right 4th toe amputation;  Surgeon: Wylene Simmer, MD;  Location: [facility];  Service: Orthopedics;  Laterality: Right;  foot block  . AMPUTATION TOE Right 02/05/2019   Procedure: RIGHT GREAT TOE AMPUTATION;  Surgeon: Leandrew Koyanagi, MD;  Location: [facility];  Service: Orthopedics;  Laterality: Right;  . BACK SURGERY  04/2013  . back surgury     f  . BLEPHAROPLASTY Bilateral 02/23/15  . CATARACT EXTRACTION    . COLONOSCOPY    . COLONOSCOPY N/A 10/02/2017   Procedure: COLONOSCOPY;  Surgeon: Doran Stabler, MD;  Location: [facility];  Service: Gastroenterology;   Laterality: N/A;  . COLONOSCOPY WITH PROPOFOL N/A 10/01/2017   Procedure: COLONOSCOPY WITH PROPOFOL;  Surgeon: Doran Stabler, MD;  Location: [facility];  Service: Gastroenterology;  Laterality: N/A;  . COLONOSCOPY WITH PROPOFOL N/A 03/07/2018   Procedure: COLONOSCOPY WITH PROPOFOL;  Surgeon: [name], MD;  Location: [facility];  Service: Endoscopy;  Laterality: N/A;  . ESOPHAGOGASTRODUODENOSCOPY (EGD) WITH PROPOFOL N/A 10/01/2017   Procedure: ESOPHAGOGASTRODUODENOSCOPY (EGD) WITH PROPOFOL;  Surgeon: Doran Stabler, MD;  Location: [facility];  Service: Gastroenterology;  Laterality: N/A;  . EYE SURGERY Bilateral   . FINGER SURGERY Right    right thumb  . HARDWARE REMOVAL Right 05/15/2016   Procedure: Right Lumbar Two-Lumbar Five Removal of Hardware;  Surgeon: Kristeen Miss, MD;  Location: [facility];  Service: Neurosurgery;  Laterality: Right;  Right L2-5 removal of hardware  . INGUINAL HERNIA REPAIR     x4  . IR RADIOLOGIST EVAL & MGMT  07/10/2018  . IR RADIOLOGIST EVAL & MGMT  09/05/2018  . IR SACROPLASTY BILATERAL  07/15/2018  . REPAIR SPIGELIAN HERNIA  2011  . [address] ,2014  . TONSILLECTOMY    . UPPER GASTROINTESTINAL ENDOSCOPY    . VASECTOMY      Allergies  Allergen Reactions  . Celebrex [Celecoxib] Other (See Comments)    Created stomach ulcers.  Yvette Rack [Cyclobenzaprine] Other (See Comments)    Caught in hiatal hernia and caused extreme pain    Allergies as of 10/01/2020      Reactions   Celebrex [celecoxib] Other (See Comments)   Created stomach ulcers.   Flexeril [cyclobenzaprine] Other (See Comments)   Caught in hiatal hernia and caused extreme pain      Medication List       Accurate as of October 01, 2020 11:59 PM. If you have any questions, ask your nurse or doctor.        acetaminophen 500 MG tablet Commonly known as: TYLENOL Take 500 mg by mouth 3 (three) times daily.   buPROPion 150 MG 12 hr tablet Commonly known as:  WELLBUTRIN SR Take 150 mg by mouth 2 (two) times daily.   CENTRUM ADULTS PO Take 1 tablet by mouth daily.   PRESERVISION AREDS 2 PO Take 1 capsule by mouth 2 (two) times daily.   cholecalciferol 1000 units tablet Commonly known as: VITAMIN D Take 1,000 Units by mouth daily.   cyanocobalamin 1000 MCG tablet Take 1,000 mcg by mouth daily.   diclofenac Sodium 1 % Gel Commonly known as: VOLTAREN Apply 2 g topically 4 (four) times daily.   docusate sodium 100 MG capsule Commonly known as: COLACE Take 100 mg by mouth daily.   gabapentin 300 MG capsule Commonly known as: NEURONTIN Take 300 mg by mouth 2 (two) times daily. due to numbness and tingling   gabapentin 100 MG capsule Commonly known as: NEURONTIN Take 200 mg by mouth every morning.   HYDROcodone-acetaminophen 5-325 MG  tablet Commonly known as: NORCO/VICODIN Take one tablet by mouth every 6 hours as needed for moderate pain. Do not exceed 4gm of Tylenol in 24 hours.   Lactase Enzyme 3000 units tablet Generic drug: lactase Take 4,500 Units by mouth 3 (three) times daily with meals.   lovastatin 20 MG tablet Commonly known as: MEVACOR Take 20 mg by mouth at bedtime.   pantoprazole 40 MG tablet Commonly known as: PROTONIX Take 40 mg by mouth every morning.   polyethylene glycol 17 g packet Commonly known as: MIRALAX / GLYCOLAX Take 17 g by mouth daily as needed.   propranolol 10 MG tablet Commonly known as: INDERAL Take 10 mg by mouth daily.   Refresh Repair 0.5-0.9 % ophthalmic solution Generic drug: carboxymethylcellul-glycerin Place 1 drop into both eyes 3 (three) times daily.   rOPINIRole 1 MG tablet Commonly known as: REQUIP TAKE 2 TABLETS(2 MG) BY MOUTH AT BEDTIME   zinc oxide 20 % ointment Apply 1 application topically as needed for irritation.       Review of Systems  Constitutional: Negative for activity change, appetite change and fever.  HENT: Positive for hearing loss. Negative for  congestion and voice change.   Eyes: Positive for redness. Negative for photophobia, pain, discharge, itching and visual disturbance.  Respiratory: Negative for cough.   Cardiovascular: Negative for leg swelling.  Gastrointestinal: Negative for abdominal pain.       Fecal incontinence  Genitourinary: Positive for difficulty urinating. Negative for dysuria.       Foley  Musculoskeletal: Positive for arthralgias, back pain, gait problem and neck pain.       R 2nd 3rd hammer toes pain with walking, L 4th, 5th toes pain when touched, right lower back to the right leg pain when sitting in w/c for a long period of time.   Skin: Negative for color change.          Neurological: Positive for tremors. Negative for speech difficulty, light-headedness and headaches.       Memory lapses. Essential tremor, peripheral neuropathy, RLS  Psychiatric/Behavioral: Negative for behavioral problems and sleep disturbance.    Immunization History  Administered Date(s) Administered  . Influenza, High Dose Seasonal PF 09/17/2019  . Influenza-Unspecified 09/16/2018, 09/07/2020  . Moderna SARS-COVID-2 Vaccination 12/08/2019, 01/05/2020  . Pneumococcal Conjugate-13 12/29/2013  . Pneumococcal Polysaccharide-23 09/08/2005  . Tdap 05/25/2005, 08/08/2018  . Zoster Recombinat (Shingrix) 10/17/2017, 01/25/2018   Pertinent  Health Maintenance Due  Topic Date Due  . OPHTHALMOLOGY EXAM  Never done  . URINE MICROALBUMIN  Never done  . FOOT EXAM  09/28/2019  . INFLUENZA VACCINE  Completed  . PNA vac Low Risk Adult  Completed  . HEMOGLOBIN A1C  Discontinued   Fall Risk  01/01/2019 09/24/2018 01/04/2017 10/06/2016 09/29/2016  Falls in the past year? 0 No No No No   Functional Status Survey:    Vitals:   10/01/20 1337  BP: 135/76  Pulse: 61  Resp: 20  Temp: 97.6 F (36.4 C)  SpO2: 97%  Weight: 158 lb 8 oz (71.9 kg)  Height: 5\' 6"  (1.676 m)   Body mass index is 25.58 kg/m. Physical Exam Vitals and nursing  note reviewed.  Constitutional:      Appearance: Normal appearance.  HENT:     Head: Normocephalic and atraumatic.  Eyes:     Extraocular Movements: Extraocular movements intact.     Conjunctiva/sclera: Conjunctivae normal.     Pupils: Pupils are equal, round, and reactive to light.  Comments: Medical right subconjunctival hemorrhage   Cardiovascular:     Rate and Rhythm: Normal rate and regular rhythm.     Heart sounds: Murmur heard.      Comments: Weak DP pulse on the right Pulmonary:     Effort: Pulmonary effort is normal.     Breath sounds: No rales.  Abdominal:     General: Bowel sounds are normal.     Palpations: Abdomen is soft.     Tenderness: There is no abdominal tenderness.  Genitourinary:    Comments: Foley  Musculoskeletal:        General: Tenderness present.     Cervical back: Normal range of motion and neck supple.     Right lower leg: No edema.     Left lower leg: No edema.     Comments: S/p the right great and 4th toe amputation. R 2nd 3rd hammer toes pain with walking, L 4th, 5th toes pain when touched, right lower back to the right leg pain when sitting in w/c for a long period of time.   Skin:    General: Skin is warm and dry.  Neurological:     General: No focal deficit present.     Mental Status: He is alert. Mental status is at baseline.     Gait: Gait abnormal.     Comments: Oriented to person, place.   Psychiatric:        Mood and Affect: Mood normal.        Behavior: Behavior normal.        Thought Content: Thought content normal.        Judgment: Judgment normal.     Labs reviewed: Recent Labs    01/15/20 0240 01/15/20 0240 01/16/20 0243 01/16/20 0243 01/17/20 0241 01/17/20 0241 02/09/20 0000 05/06/20 0000 08/16/20 0000  NA 135   < > 135   < > 135  --  138 139 139  K 3.9   < > 4.2   < > 3.8   < > 4.1 4.2 4.0  CL 111   < > 112*   < > 108   < > 105 106 108  CO2 16*   < > 18*   < > 20*   < > 24* 25* 24*  GLUCOSE 98  --  126*  --   100*  --   --   --   --   BUN 11   < > 11   < > 12  --  17 15 15   CREATININE 0.74   < > 0.70   < > 0.61  --  0.7 0.8 0.8  CALCIUM 7.6*   < > 7.6*   < > 7.7*   < > 8.9 8.8 8.6*  MG 1.8  --  1.8  --  1.7  --   --   --   --    < > = values in this interval not displayed.   Recent Labs    01/12/20 2110 01/12/20 2110 02/09/20 0000 05/06/20 0000 08/16/20 0000  AST 38   < > 31 27 27   ALT 18   < > 10 9* 10  ALKPHOS 76   < > 98 91 91  BILITOT 0.6  --   --   --   --   PROT 6.5  --   --   --   --   ALBUMIN 3.0*   < > 3.4* 3.5 3.7   < > = values  in this interval not displayed.   Recent Labs    01/15/20 0240 01/15/20 0240 01/16/20 0243 01/16/20 0243 01/17/20 0241 01/17/20 0241 02/09/20 0000 05/06/20 0000 08/16/20 0000  WBC 18.0*   < > 16.8*   < > 11.6*  --  6.6 7.8 6.5  NEUTROABS 15.7*   < > 14.4*  --  9.4*  --  4,204  --   --   HGB 11.2*   < > 10.7*   < > 10.9*   < > 13.0* 12.9* 12.9*  HCT 33.9*   < > 33.2*   < > 34.0*   < > 41 39* 39*  MCV 91.1  --  93.0  --  91.6  --   --   --   --   PLT 256   < > 252   < > 256   < > 272 224 199   < > = values in this interval not displayed.   Lab Results  Component Value Date   TSH 2.38 02/09/2020   Lab Results  Component Value Date   HGBA1C 5.2 08/29/2018   Lab Results  Component Value Date   CHOL 158 08/29/2018   HDL 34 (A) 05/06/2020   LDLCALC 103 05/06/2020   TRIG 154 05/06/2020    Significant Diagnostic Results in last 30 days:  No results found.  Assessment/Plan Subconjunctival hemorrhage of right eye Medial the right conjunctiva, no pain or change of vision, already have artificial tears, it should heal.   REM sleep behavior disorder Hx ofRLS/REM, stable, on Requip 2mg  qd.    Essential tremor Essential tremor, stable, on Propranolol 10mg  qd.    GERD GERD, stable, on Pantoprazole 40mg  qd.  Neuropathy Neuropathic pain in BLE,takesGabapentin 300mg  bid,200mg  qd, Tylenol 500mg  tid, Norco q6hr prn.c/o R 2nd 3rd  hammer toes pain with walking, L 4th, 5th toes pain when touched, right lower back to the right leg pain when sitting in w/c for a long period of time.  Depression, recurrent ([place]) His mood is stable, on Wellbutrin 150mg  bid.   Urinary retention Foley chronic, f/u Urology 06/10/20.    Family/ staff Communication: plan of care reviewed with the patient and charge nurse.   Labs/tests ordered:  None  Time spend 25 minutes.

## 2020-10-01 NOTE — Assessment & Plan Note (Signed)
Medial the right conjunctiva, no pain or change of vision, already have artificial tears, it should heal.

## 2020-10-01 NOTE — Assessment & Plan Note (Signed)
Essential tremor, stable, on Propranolol 10mg  qd.

## 2020-10-01 NOTE — Assessment & Plan Note (Signed)
GERD, stable, on Pantoprazole 40mg qd.  

## 2020-10-01 NOTE — Assessment & Plan Note (Signed)
His mood is stable, on Wellbutrin 150mg  bid.

## 2020-10-01 NOTE — Assessment & Plan Note (Signed)
Hx ofRLS/REM, stable, on Requip 2mg  qd.

## 2020-10-01 NOTE — Assessment & Plan Note (Signed)
Foley chronic, f/u Urology 06/10/20. 

## 2020-10-04 ENCOUNTER — Encounter: Payer: Self-pay | Admitting: Nurse Practitioner

## 2020-10-04 DIAGNOSIS — R293 Abnormal posture: Secondary | ICD-10-CM | POA: Diagnosis not present

## 2020-10-04 DIAGNOSIS — R1313 Dysphagia, pharyngeal phase: Secondary | ICD-10-CM | POA: Diagnosis not present

## 2020-10-04 DIAGNOSIS — R52 Pain, unspecified: Secondary | ICD-10-CM | POA: Diagnosis not present

## 2020-10-04 DIAGNOSIS — M6281 Muscle weakness (generalized): Secondary | ICD-10-CM | POA: Diagnosis not present

## 2020-10-06 DIAGNOSIS — H903 Sensorineural hearing loss, bilateral: Secondary | ICD-10-CM | POA: Diagnosis not present

## 2020-10-07 ENCOUNTER — Encounter: Payer: Self-pay | Admitting: *Deleted

## 2020-10-07 ENCOUNTER — Encounter: Payer: Self-pay | Admitting: Internal Medicine

## 2020-10-07 ENCOUNTER — Non-Acute Institutional Stay (SKILLED_NURSING_FACILITY): Payer: Medicare Other | Admitting: Internal Medicine

## 2020-10-07 DIAGNOSIS — R339 Retention of urine, unspecified: Secondary | ICD-10-CM | POA: Diagnosis not present

## 2020-10-07 DIAGNOSIS — M5442 Lumbago with sciatica, left side: Secondary | ICD-10-CM | POA: Diagnosis not present

## 2020-10-07 DIAGNOSIS — J40 Bronchitis, not specified as acute or chronic: Secondary | ICD-10-CM

## 2020-10-07 DIAGNOSIS — G8929 Other chronic pain: Secondary | ICD-10-CM | POA: Diagnosis not present

## 2020-10-07 DIAGNOSIS — F339 Major depressive disorder, recurrent, unspecified: Secondary | ICD-10-CM

## 2020-10-07 DIAGNOSIS — M5441 Lumbago with sciatica, right side: Secondary | ICD-10-CM | POA: Diagnosis not present

## 2020-10-07 DIAGNOSIS — G250 Essential tremor: Secondary | ICD-10-CM

## 2020-10-07 DIAGNOSIS — G629 Polyneuropathy, unspecified: Secondary | ICD-10-CM | POA: Diagnosis not present

## 2020-10-07 NOTE — Progress Notes (Signed)
Location:    [facility] Room Number: 26 Place of Service:  SNF [id number]) Provider:  Veleta Miners MD  [name], MD  Patient Care Team: [name], MD as PCP - General (Internal Medicine) [name], MD (Hematology and Oncology) [name], MD as Attending Physician (Urology) [name], MD (Gastroenterology)  Extended Emergency Contact Information Primary Emergency Contact: Marce, [place] Mobile Phone: [phone] Relation: Son Preferred language: English Interpreter needed? No Secondary Emergency Contact: Stuck,Janet H Address: [address]          [address]          [place], [place] [address] [name] of [place] Phone: [phone] Mobile Phone: [phone] Relation: Spouse  Code Status:  DNR Goals of care: Advanced Directive information Advanced Directives 09/14/2020  Does Patient Have a Medical Advance Directive? Yes  Type of Paramedic of [place];Living will;Out of facility DNR (pink MOST or yellow form)  Does patient want to make changes to medical advance directive? No - Patient declined  Copy of [address] in Chart? Yes - validated most recent copy scanned in chart (See row information)  Would patient like information on creating a medical advance directive? -  Pre-existing out of facility DNR order (yellow form or pink MOST form) Yellow form placed in chart (order not valid for inpatient use);Pink MOST form placed in chart (order not valid for inpatient use)     Chief Complaint  Patient presents with  . Acute Visit    Cough    HPI:  Pt is a 86 y.o. 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 With Productive sputum. No Fever or SOB No Chest pain. Says Cough is worse with CPAP at night    Past Medical History:  Diagnosis Date  . Anemia   . Arthritis   . AVM (arteriovenous malformation)   . Barrett's esophagus   . BPH (benign prostatic hyperplasia)   . Cataract   . Constipation   . CPAP (continuous positive airway pressure) dependence   . Depression   . Diverticulosis    diverticular bleeding  . Diverticulosis   . GERD (gastroesophageal reflux disease)   . History of radiation therapy   . HOH (hard of hearing)    wears hearing aids  . Hyperlipidemia   . Hypoglycemia   . Lymphocytosis 06/2012  . Mastodynia   . Murmur    slight  . Nasal congestion   . Osteoporosis   . Osteoporosis   . Prostate cancer ([place])   . Sleep apnea    wears CPAP  . Urinary retention with incomplete bladder emptying    pt uses bathroom and within 30 mins needs to go again  . Vitamin D deficiency    Past Surgical History:  Procedure Laterality Date  . AMPUTATION TOE Right 09/20/2017   Procedure: Right 4th toe amputation;  Surgeon: Wylene Simmer, MD;  Location: [facility];  Service: Orthopedics;  Laterality: Right;  foot block  . AMPUTATION TOE Right 02/05/2019   Procedure: RIGHT GREAT TOE AMPUTATION;  Surgeon: Frankey Shown  M, MD;  Location: [facility];  Service: Orthopedics;  Laterality: Right;  . BACK SURGERY  04/2013  . back surgury     f  . BLEPHAROPLASTY Bilateral 02/23/15  . CATARACT EXTRACTION    . COLONOSCOPY    . COLONOSCOPY N/A 10/02/2017   Procedure: COLONOSCOPY;  Surgeon: Doran Stabler, MD;  Location: [facility];  Service: Gastroenterology;  Laterality: N/A;  . COLONOSCOPY WITH PROPOFOL N/A 10/01/2017   Procedure: COLONOSCOPY WITH PROPOFOL;  Surgeon: Doran Stabler, MD;  Location: [facility];  Service: Gastroenterology;   Laterality: N/A;  . COLONOSCOPY WITH PROPOFOL N/A 03/07/2018   Procedure: COLONOSCOPY WITH PROPOFOL;  Surgeon: [name], MD;  Location: [facility];  Service: Endoscopy;  Laterality: N/A;  . ESOPHAGOGASTRODUODENOSCOPY (EGD) WITH PROPOFOL N/A 10/01/2017   Procedure: ESOPHAGOGASTRODUODENOSCOPY (EGD) WITH PROPOFOL;  Surgeon: Doran Stabler, MD;  Location: [facility];  Service: Gastroenterology;  Laterality: N/A;  . EYE SURGERY Bilateral   . FINGER SURGERY Right    right thumb  . HARDWARE REMOVAL Right 05/15/2016   Procedure: Right Lumbar Two-Lumbar Five Removal of Hardware;  Surgeon: Kristeen Miss, MD;  Location: [facility];  Service: Neurosurgery;  Laterality: Right;  Right L2-5 removal of hardware  . INGUINAL HERNIA REPAIR     x4  . IR RADIOLOGIST EVAL & MGMT  07/10/2018  . IR RADIOLOGIST EVAL & MGMT  09/05/2018  . IR SACROPLASTY BILATERAL  07/15/2018  . REPAIR SPIGELIAN HERNIA  2011  . [place] ,2014  . TONSILLECTOMY    . UPPER GASTROINTESTINAL ENDOSCOPY    . VASECTOMY      Allergies  Allergen Reactions  . Celebrex [Celecoxib] Other (See Comments)    Created stomach ulcers.  Yvette Rack [Cyclobenzaprine] Other (See Comments)    Caught in hiatal hernia and caused extreme pain    Allergies as of 10/07/2020      Reactions   Celebrex [celecoxib] Other (See Comments)   Created stomach ulcers.   Flexeril [cyclobenzaprine] Other (See Comments)   Caught in hiatal hernia and caused extreme pain      Medication List       Accurate as of October 07, 2020  3:00 PM. If you have any questions, ask your nurse or doctor.        acetaminophen 500 MG tablet Commonly known as: TYLENOL Take 500 mg by mouth 3 (three) times daily.   buPROPion 150 MG 12 hr tablet Commonly known as: WELLBUTRIN SR Take 150 mg by mouth 2 (two) times daily.   CENTRUM ADULTS PO Take 1 tablet by mouth daily.   PRESERVISION AREDS 2 PO Take 1 capsule by mouth 2 (two) times daily.     cholecalciferol 1000 units tablet Commonly known as: VITAMIN D Take 1,000 Units by mouth daily.   cyanocobalamin 1000 MCG tablet Take 1,000 mcg by mouth daily.   diclofenac Sodium 1 % Gel Commonly known as: VOLTAREN Apply 2 g topically 4 (four) times daily.   docusate sodium 100 MG capsule Commonly known as: COLACE Take 100 mg by mouth daily.   gabapentin 300 MG capsule Commonly known as: NEURONTIN Take 300 mg by mouth 2 (two) times daily. due to numbness and tingling   gabapentin 100 MG capsule Commonly known as: NEURONTIN Take 200 mg by mouth every morning.   HYDROcodone-acetaminophen 5-325 MG tablet Commonly known as: NORCO/VICODIN Take one tablet by mouth every 6 hours as needed for moderate pain. Do not  exceed 4gm of Tylenol in 24 hours.   Lactase Enzyme 3000 units tablet Generic drug: lactase Take 4,500 Units by mouth 3 (three) times daily with meals.   lovastatin 20 MG tablet Commonly known as: MEVACOR Take 20 mg by mouth at bedtime.   pantoprazole 40 MG tablet Commonly known as: PROTONIX Take 40 mg by mouth every morning.   polyethylene glycol 17 g packet Commonly known as: MIRALAX / GLYCOLAX Take 17 g by mouth daily as needed.   propranolol 10 MG tablet Commonly known as: INDERAL Take 10 mg by mouth daily.   Refresh Repair 0.5-0.9 % ophthalmic solution Generic drug: carboxymethylcellul-glycerin Place 1 drop into both eyes 3 (three) times daily.   rOPINIRole 1 MG tablet Commonly known as: REQUIP TAKE 2 TABLETS(2 MG) BY MOUTH AT BEDTIME   zinc oxide 20 % ointment Apply 1 application topically as needed for irritation.       Review of Systems  Constitutional: Negative.   HENT: Positive for congestion and rhinorrhea.   Respiratory: Positive for cough and shortness of breath. Negative for wheezing.   Cardiovascular: Negative.   Gastrointestinal: Negative.   Genitourinary: Negative.   Musculoskeletal: Positive for gait problem.  Skin:  Negative.   Psychiatric/Behavioral: Positive for sleep disturbance.    Immunization History  Administered Date(s) Administered  . Influenza, High Dose Seasonal PF 09/17/2019  . Influenza-Unspecified 09/16/2018, 09/07/2020  . Moderna SARS-COVID-2 Vaccination 12/08/2019, 01/05/2020  . Pneumococcal Conjugate-13 12/29/2013  . Pneumococcal Polysaccharide-23 09/08/2005  . Tdap 05/25/2005, 08/08/2018  . Zoster Recombinat (Shingrix) 10/17/2017, 01/25/2018   Pertinent  Health Maintenance Due  Topic Date Due  . OPHTHALMOLOGY EXAM  Never done  . URINE MICROALBUMIN  Never done  . FOOT EXAM  09/28/2019  . INFLUENZA VACCINE  Completed  . PNA vac Low Risk Adult  Completed  . HEMOGLOBIN A1C  Discontinued   Fall Risk  01/01/2019 09/24/2018 01/04/2017 10/06/2016 09/29/2016  Falls in the past year? 0 No No No No   Functional Status Survey:    Vitals:   10/07/20 1454  BP: 120/70  Pulse: (!) 58  Resp: 18  Temp: 97.6 F (36.4 C)  SpO2: 97%  Weight: 161 lb 3.2 oz (73.1 kg)  Height: 5\' 6"  (1.676 m)   Body mass index is 26.02 kg/m. Physical Exam Vitals reviewed.  Constitutional:      Appearance: Normal appearance.  HENT:     Head: Normocephalic.     Nose: Nose normal.     Mouth/Throat:     Mouth: Mucous membranes are moist.     Pharynx: Oropharynx is clear.  Eyes:     Pupils: Pupils are equal, round, and reactive to light.  Cardiovascular:     Rate and Rhythm: Normal rate and regular rhythm.     Pulses: Normal pulses.     Heart sounds: Normal heart sounds.  Pulmonary:     Effort: Pulmonary effort is normal.     Comments: Few Rales in the base Abdominal:     General: Abdomen is flat. Bowel sounds are normal.     Palpations: Abdomen is soft.  Musculoskeletal:        General: No swelling.     Cervical back: Neck supple.  Skin:    General: Skin is warm.  Neurological:     General: No focal deficit present.     Mental Status: He is alert and oriented to person, place, and time.   Psychiatric:        Mood and  Affect: Mood normal.        Thought Content: Thought content normal.     Labs reviewed: Recent Labs    01/15/20 0240 01/15/20 0240 01/16/20 0243 01/16/20 0243 01/17/20 0241 01/17/20 0241 02/09/20 0000 05/06/20 0000 08/16/20 0000  NA 135   < > 135   < > 135  --  138 139 139  K 3.9   < > 4.2   < > 3.8   < > 4.1 4.2 4.0  CL 111   < > 112*   < > 108   < > 105 106 108  CO2 16*   < > 18*   < > 20*   < > 24* 25* 24*  GLUCOSE 98  --  126*  --  100*  --   --   --   --   BUN 11   < > 11   < > 12  --  17 15 15   CREATININE 0.74   < > 0.70   < > 0.61  --  0.7 0.8 0.8  CALCIUM 7.6*   < > 7.6*   < > 7.7*   < > 8.9 8.8 8.6*  MG 1.8  --  1.8  --  1.7  --   --   --   --    < > = values in this interval not displayed.   Recent Labs    01/12/20 2110 01/12/20 2110 02/09/20 0000 05/06/20 0000 08/16/20 0000  AST 38   < > 31 27 27   ALT 18   < > 10 9* 10  ALKPHOS 76   < > 98 91 91  BILITOT 0.6  --   --   --   --   PROT 6.5  --   --   --   --   ALBUMIN 3.0*   < > 3.4* 3.5 3.7   < > = values in this interval not displayed.   Recent Labs    01/15/20 0240 01/15/20 0240 01/16/20 0243 01/16/20 0243 01/17/20 0241 01/17/20 0241 02/09/20 0000 05/06/20 0000 08/16/20 0000  WBC 18.0*   < > 16.8*   < > 11.6*  --  6.6 7.8 6.5  NEUTROABS 15.7*   < > 14.4*  --  9.4*  --  4,204  --   --   HGB 11.2*   < > 10.7*   < > 10.9*   < > 13.0* 12.9* 12.9*  HCT 33.9*   < > 33.2*   < > 34.0*   < > 41 39* 39*  MCV 91.1  --  93.0  --  91.6  --   --   --   --   PLT 256   < > 252   < > 256   < > 272 224 199   < > = values in this interval not displayed.   Lab Results  Component Value Date   TSH 2.38 02/09/2020   Lab Results  Component Value Date   HGBA1C 5.2 08/29/2018   Lab Results  Component Value Date   CHOL 158 08/29/2018   HDL 34 (A) 05/06/2020   LDLCALC 103 05/06/2020   TRIG 154 05/06/2020    Significant Diagnostic Results in last 30 days:  No results  found.  Assessment/Plan Bronchitis Recently was treated for Skin infection with Doxycyline Will order Chest Xray  Start on Low dose of Prednisone 40 mg  Taper over next few days for 1 week Start  on Claritin and Mucinex  Neuropathy On High Dose of Neurontin Depression, recurrent ([place]) Continue Well butrin Urinary retention Has Foley Cathter Chronic bilateral low back pain with bilateral sciatica On NOcro Tremor, essential On Low dose of Propranol Hyperlipidemia On Mevacor Restless  On Requip   Family/ staff Communication:   Labs/tests ordered:

## 2020-10-07 NOTE — Progress Notes (Signed)
This encounter was created in error - please disregard.

## 2020-10-11 DIAGNOSIS — R1313 Dysphagia, pharyngeal phase: Secondary | ICD-10-CM | POA: Diagnosis not present

## 2020-10-11 DIAGNOSIS — R293 Abnormal posture: Secondary | ICD-10-CM | POA: Diagnosis not present

## 2020-10-11 DIAGNOSIS — R52 Pain, unspecified: Secondary | ICD-10-CM | POA: Diagnosis not present

## 2020-10-11 DIAGNOSIS — M6281 Muscle weakness (generalized): Secondary | ICD-10-CM | POA: Diagnosis not present

## 2020-10-12 DIAGNOSIS — R293 Abnormal posture: Secondary | ICD-10-CM | POA: Diagnosis not present

## 2020-10-12 DIAGNOSIS — M6281 Muscle weakness (generalized): Secondary | ICD-10-CM | POA: Diagnosis not present

## 2020-10-12 DIAGNOSIS — R1313 Dysphagia, pharyngeal phase: Secondary | ICD-10-CM | POA: Diagnosis not present

## 2020-10-12 DIAGNOSIS — R52 Pain, unspecified: Secondary | ICD-10-CM | POA: Diagnosis not present

## 2020-10-14 DIAGNOSIS — R293 Abnormal posture: Secondary | ICD-10-CM | POA: Diagnosis not present

## 2020-10-14 DIAGNOSIS — R52 Pain, unspecified: Secondary | ICD-10-CM | POA: Diagnosis not present

## 2020-10-14 DIAGNOSIS — M6281 Muscle weakness (generalized): Secondary | ICD-10-CM | POA: Diagnosis not present

## 2020-10-14 DIAGNOSIS — R1313 Dysphagia, pharyngeal phase: Secondary | ICD-10-CM | POA: Diagnosis not present

## 2020-10-18 ENCOUNTER — Encounter: Payer: Self-pay | Admitting: Nurse Practitioner

## 2020-10-18 ENCOUNTER — Non-Acute Institutional Stay (SKILLED_NURSING_FACILITY): Payer: Medicare Other | Admitting: Nurse Practitioner

## 2020-10-18 DIAGNOSIS — R293 Abnormal posture: Secondary | ICD-10-CM | POA: Diagnosis not present

## 2020-10-18 DIAGNOSIS — J40 Bronchitis, not specified as acute or chronic: Secondary | ICD-10-CM | POA: Diagnosis not present

## 2020-10-18 DIAGNOSIS — R52 Pain, unspecified: Secondary | ICD-10-CM | POA: Diagnosis not present

## 2020-10-18 DIAGNOSIS — R1313 Dysphagia, pharyngeal phase: Secondary | ICD-10-CM | POA: Diagnosis not present

## 2020-10-18 DIAGNOSIS — M6281 Muscle weakness (generalized): Secondary | ICD-10-CM | POA: Diagnosis not present

## 2020-10-18 DIAGNOSIS — Z23 Encounter for immunization: Secondary | ICD-10-CM | POA: Diagnosis not present

## 2020-10-18 NOTE — Progress Notes (Signed)
Location:   SNF [place] Room Number: 26 Place of Service:  SNF (31) Provider: [facility] Radwan Cowley NP  [name], MD  Patient Care Team: [name], MD as PCP - General (Internal Medicine) [name], MD (Hematology and Oncology) [name], MD as Attending Physician (Urology) [name], MD (Gastroenterology)  Extended Emergency Contact Information Primary Emergency Contact: Dashan, Chizmar Mobile Phone: [phone] Relation: Son Preferred language: English Interpreter needed? No Secondary Emergency Contact: Bieker,Janet H Address: [address]          [address]          [place], [place] [address] [name] of [place] Phone: [phone] Mobile Phone: [phone] Relation: Spouse  Code Status:  DNR Goals of care: Advanced Directive information Advanced Directives 09/14/2020  Does Patient Have a Medical Advance Directive? Yes  Type of Paramedic of [place];Living will;Out of facility DNR (pink MOST or yellow form)  Does patient want to make changes to medical advance directive? No - Patient declined  Copy of [place] in Chart? Yes - validated most recent copy scanned in chart (See row information)  Would patient like information on creating a medical advance directive? -  Pre-existing out of facility DNR order (yellow form or pink MOST form) Yellow form placed in chart (order not valid for inpatient use);Pink MOST form placed in chart (order not valid for inpatient use)     Chief Complaint  Patient presents with  . Medical Management of Chronic Issues    HPI:  Pt is a 86 y.o. male seen today for medical management of chronic diseases.    Treated Bronchitis 10/07/20 Prednisone taper dose, Claritin, Mucinex, negative COVID, persisted cough, brownish mucous, chronic. 10/08/20 CXR no acute pulmonary disease.    Hx ofRLS/REM, stable, on Requip 2mg  qd.  Essential tremor,  stable, on Propranolol 10mg  qd.  GERD, stable, on Pantoprazole 40mg  qd.  Neuropathic pain in BLE,takesGabapentin 300mg  bid,200mg  qd, Tylenol 500mg  tid, Norco q6hr prn.c/o R 2nd 3rd hammer toes pain with walking, L 4th, 5th toes pain when touched, right lower back to the right leg pain when sitting in w/c for a long period of time. His mood is stable, on Wellbutrin 150mg  bid. Foley chronic, f/u Urology 06/10/20.   Past Medical History:  Diagnosis Date  . Anemia   . Arthritis   . AVM (arteriovenous malformation)   . Barrett's esophagus   . BPH (benign prostatic hyperplasia)   . Cataract   . Constipation   . CPAP (continuous positive airway pressure) dependence   . Depression   . Diverticulosis    diverticular bleeding  . Diverticulosis   . GERD (gastroesophageal reflux disease)   . History of radiation therapy   . HOH (hard of hearing)    wears hearing aids  . Hyperlipidemia   . Hypoglycemia   . Lymphocytosis 06/2012  . Mastodynia   . Murmur    slight  . Nasal congestion   . Osteoporosis   . Osteoporosis   . Prostate cancer ([place])   . Sleep apnea    wears CPAP  . Urinary retention with incomplete bladder emptying    pt uses bathroom and within 30 mins needs to go again  . Vitamin D deficiency    Past Surgical History:  Procedure Laterality Date  . AMPUTATION TOE Right 09/20/2017   Procedure: Right 4th toe amputation;  Surgeon: Wylene Simmer, MD;  Location: [place];  Service: Orthopedics;  Laterality: Right;  foot block  . AMPUTATION TOE Right 02/05/2019   Procedure: RIGHT GREAT TOE AMPUTATION;  Surgeon: Leandrew Koyanagi, MD;  Location: [place];  Service: Orthopedics;  Laterality: Right;  . BACK SURGERY  04/2013  . back surgury     f  . BLEPHAROPLASTY Bilateral 02/23/15  . CATARACT EXTRACTION    . COLONOSCOPY    . COLONOSCOPY N/A 10/02/2017   Procedure: COLONOSCOPY;  Surgeon:  Doran Stabler, MD;  Location: [facility];  Service: Gastroenterology;  Laterality: N/A;  . COLONOSCOPY WITH PROPOFOL N/A 10/01/2017   Procedure: COLONOSCOPY WITH PROPOFOL;  Surgeon: Doran Stabler, MD;  Location: [facility];  Service: Gastroenterology;  Laterality: N/A;  . COLONOSCOPY WITH PROPOFOL N/A 03/07/2018   Procedure: COLONOSCOPY WITH PROPOFOL;  Surgeon: [name], MD;  Location: [facility];  Service: Endoscopy;  Laterality: N/A;  . ESOPHAGOGASTRODUODENOSCOPY (EGD) WITH PROPOFOL N/A 10/01/2017   Procedure: ESOPHAGOGASTRODUODENOSCOPY (EGD) WITH PROPOFOL;  Surgeon: Doran Stabler, MD;  Location: [facility];  Service: Gastroenterology;  Laterality: N/A;  . EYE SURGERY Bilateral   . FINGER SURGERY Right    right thumb  . HARDWARE REMOVAL Right 05/15/2016   Procedure: Right Lumbar Two-Lumbar Five Removal of Hardware;  Surgeon: Kristeen Miss, MD;  Location: [facility];  Service: Neurosurgery;  Laterality: Right;  Right L2-5 removal of hardware  . INGUINAL HERNIA REPAIR     x4  . IR RADIOLOGIST EVAL & MGMT  07/10/2018  . IR RADIOLOGIST EVAL & MGMT  09/05/2018  . IR SACROPLASTY BILATERAL  07/15/2018  . REPAIR SPIGELIAN HERNIA  2011  . [place] ,2014  . TONSILLECTOMY    . UPPER GASTROINTESTINAL ENDOSCOPY    . VASECTOMY      Allergies  Allergen Reactions  . Celebrex [Celecoxib] Other (See Comments)    Created stomach ulcers.  Yvette Rack [Cyclobenzaprine] Other (See Comments)    Caught in hiatal hernia and caused extreme pain    Allergies as of 10/18/2020      Reactions   Celebrex [celecoxib] Other (See Comments)   Created stomach ulcers.   Flexeril [cyclobenzaprine] Other (See Comments)   Caught in hiatal hernia and caused extreme pain      Medication List       Accurate as of October 18, 2020 11:59 PM. If you have any questions, ask your nurse or doctor.        acetaminophen 500 MG tablet Commonly known as: TYLENOL Take 500 mg by mouth  3 (three) times daily.   buPROPion 150 MG 12 hr tablet Commonly known as: WELLBUTRIN SR Take 150 mg by mouth 2 (two) times daily.   CENTRUM ADULTS PO Take 1 tablet by mouth daily.   PRESERVISION AREDS 2 PO Take 1 capsule by mouth 2 (two) times daily.   cholecalciferol 1000 units tablet Commonly known as: VITAMIN D Take 1,000 Units by mouth daily.   cyanocobalamin 1000 MCG tablet Take 1,000 mcg by mouth daily.   dextromethorphan-guaiFENesin 30-600 MG 12hr tablet Commonly known as: MUCINEX DM Take 1 tablet by mouth 2 (two) times daily.   diclofenac Sodium 1 % Gel Commonly known as: VOLTAREN Apply 2 g topically 4 (four) times daily.   docusate sodium 100 MG capsule Commonly known as: COLACE Take 100 mg by mouth daily.   gabapentin 300 MG capsule Commonly known as: NEURONTIN Take 300 mg by mouth 2 (two) times daily. due to numbness  and tingling   gabapentin 100 MG capsule Commonly known as: NEURONTIN Take 200 mg by mouth every morning.   HYDROcodone-acetaminophen 5-325 MG tablet Commonly known as: NORCO/VICODIN Take one tablet by mouth every 6 hours as needed for moderate pain. Do not exceed 4gm of Tylenol in 24 hours.   Lactase Enzyme 3000 units tablet Generic drug: lactase Take 4,500 Units by mouth 3 (three) times daily with meals.   loratadine 10 MG tablet Commonly known as: CLARITIN Take 10 mg by mouth daily.   lovastatin 20 MG tablet Commonly known as: MEVACOR Take 20 mg by mouth at bedtime.   pantoprazole 40 MG tablet Commonly known as: PROTONIX Take 40 mg by mouth every morning.   polyethylene glycol 17 g packet Commonly known as: MIRALAX / GLYCOLAX Take 17 g by mouth daily as needed.   propranolol 10 MG tablet Commonly known as: INDERAL Take 10 mg by mouth daily.   Refresh Repair 0.5-0.9 % ophthalmic solution Generic drug: carboxymethylcellul-glycerin Place 1 drop into both eyes 3 (three) times daily.   rOPINIRole 1 MG tablet Commonly known  as: REQUIP TAKE 2 TABLETS(2 MG) BY MOUTH AT BEDTIME   zinc oxide 20 % ointment Apply 1 application topically as needed for irritation.       Review of Systems  Constitutional: Negative for activity change, appetite change and fever.  HENT: Positive for hearing loss. Negative for congestion and voice change.   Eyes: Negative for visual disturbance.  Respiratory: Positive for cough. Negative for choking, chest tightness, shortness of breath and wheezing.        Brownish phlegm.   Cardiovascular: Negative for leg swelling.  Gastrointestinal: Negative for abdominal pain.       Fecal incontinence  Genitourinary: Positive for difficulty urinating. Negative for dysuria.       Foley  Musculoskeletal: Positive for arthralgias, back pain, gait problem and neck pain.       R 2nd 3rd hammer toes pain with walking, L 4th, 5th toes pain when touched, right lower back to the right leg pain when sitting in w/c for a long period of time.   Skin: Negative for color change.          Neurological: Positive for tremors. Negative for speech difficulty, light-headedness and headaches.       Memory lapses. Essential tremor, peripheral neuropathy, RLS  Psychiatric/Behavioral: Negative for behavioral problems and sleep disturbance.    Immunization History  Administered Date(s) Administered  . Influenza, High Dose Seasonal PF 09/17/2019  . Influenza-Unspecified 09/16/2018, 09/07/2020  . Moderna SARS-COVID-2 Vaccination 12/08/2019, 01/05/2020  . Pneumococcal Conjugate-13 12/29/2013  . Pneumococcal Polysaccharide-23 09/08/2005  . Tdap 05/25/2005, 08/08/2018  . Zoster Recombinat (Shingrix) 10/17/2017, 01/25/2018   Pertinent  Health Maintenance Due  Topic Date Due  . OPHTHALMOLOGY EXAM  Never done  . URINE MICROALBUMIN  Never done  . FOOT EXAM  09/28/2019  . INFLUENZA VACCINE  Completed  . PNA vac Low Risk Adult  Completed  . HEMOGLOBIN A1C  Discontinued   Fall Risk  01/01/2019 09/24/2018 01/04/2017  10/06/2016 09/29/2016  Falls in the past year? 0 No No No No   Functional Status Survey:    Vitals:   10/18/20 1555  BP: 128/60  Pulse: 61  Resp: 20  Temp: (!) 97.1 F (36.2 C)  SpO2: 96%  Weight: 161 lb 3.2 oz (73.1 kg)  Height: 5\' 6"  (1.676 m)   Body mass index is 26.02 kg/m. Physical Exam Vitals and nursing note reviewed.  Constitutional:      Appearance: Normal appearance.  HENT:     Head: Normocephalic and atraumatic.     Mouth/Throat:     Mouth: Mucous membranes are moist.  Eyes:     Extraocular Movements: Extraocular movements intact.     Conjunctiva/sclera: Conjunctivae normal.     Pupils: Pupils are equal, round, and reactive to light.     Comments: Medical right subconjunctival hemorrhage   Cardiovascular:     Rate and Rhythm: Normal rate and regular rhythm.     Heart sounds: Murmur heard.      Comments: Weak DP pulse on the right Pulmonary:     Effort: Pulmonary effort is normal.     Breath sounds: No wheezing, rhonchi or rales.  Abdominal:     General: Bowel sounds are normal.     Palpations: Abdomen is soft.     Tenderness: There is no abdominal tenderness.  Genitourinary:    Comments: Foley  Musculoskeletal:        General: Tenderness present.     Cervical back: Normal range of motion and neck supple.     Right lower leg: No edema.     Left lower leg: No edema.     Comments: S/p the right great and 4th toe amputation. R 2nd 3rd hammer toes pain with walking, L 4th, 5th toes pain when touched, right lower back to the right leg pain when sitting in w/c for a long period of time.   Skin:    General: Skin is warm and dry.  Neurological:     General: No focal deficit present.     Mental Status: He is alert. Mental status is at baseline.     Gait: Gait abnormal.     Comments: Oriented to person, place.   Psychiatric:        Mood and Affect: Mood normal.        Behavior: Behavior normal.        Thought Content: Thought content normal.         Judgment: Judgment normal.     Labs reviewed: Recent Labs    01/15/20 0240 01/15/20 0240 01/16/20 0243 01/16/20 0243 01/17/20 0241 01/17/20 0241 02/09/20 0000 05/06/20 0000 08/16/20 0000  NA 135   < > 135   < > 135  --  138 139 139  K 3.9   < > 4.2   < > 3.8   < > 4.1 4.2 4.0  CL 111   < > 112*   < > 108   < > 105 106 108  CO2 16*   < > 18*   < > 20*   < > 24* 25* 24*  GLUCOSE 98  --  126*  --  100*  --   --   --   --   BUN 11   < > 11   < > 12  --  17 15 15   CREATININE 0.74   < > 0.70   < > 0.61  --  0.7 0.8 0.8  CALCIUM 7.6*   < > 7.6*   < > 7.7*   < > 8.9 8.8 8.6*  MG 1.8  --  1.8  --  1.7  --   --   --   --    < > = values in this interval not displayed.   Recent Labs    01/12/20 2110 01/12/20 2110 02/09/20 0000 05/06/20 0000 08/16/20 0000  AST 38   < >  31 27 27   ALT 18   < > 10 9* 10  ALKPHOS 76   < > 98 91 91  BILITOT 0.6  --   --   --   --   PROT 6.5  --   --   --   --   ALBUMIN 3.0*   < > 3.4* 3.5 3.7   < > = values in this interval not displayed.   Recent Labs    01/15/20 0240 01/15/20 0240 01/16/20 0243 01/16/20 0243 01/17/20 0241 01/17/20 0241 02/09/20 0000 05/06/20 0000 08/16/20 0000  WBC 18.0*   < > 16.8*   < > 11.6*  --  6.6 7.8 6.5  NEUTROABS 15.7*   < > 14.4*  --  9.4*  --  4,204  --   --   HGB 11.2*   < > 10.7*   < > 10.9*   < > 13.0* 12.9* 12.9*  HCT 33.9*   < > 33.2*   < > 34.0*   < > 41 39* 39*  MCV 91.1  --  93.0  --  91.6  --   --   --   --   PLT 256   < > 252   < > 256   < > 272 224 199   < > = values in this interval not displayed.   Lab Results  Component Value Date   TSH 2.38 02/09/2020   Lab Results  Component Value Date   HGBA1C 5.2 08/29/2018   Lab Results  Component Value Date   CHOL 158 08/29/2018   HDL 34 (A) 05/06/2020   LDLCALC 103 05/06/2020   TRIG 154 05/06/2020    Significant Diagnostic Results in last 30 days:  No results found.  Assessment/Plan Bronchitis Treated Bronchitis 10/07/20 Prednisone  taper dose, Claritin, Mucinex, negative COVID, persisted cough, brownish mucous phlegm, chronic. 10/08/20 CXR no acute pulmonary disease.  Will repeat CXR, speech eval.    Family/ staff Communication: plan of care reviewed with the patient and charge nurse.   Labs/tests ordered:  Repeat CXR ap/lateral.   Time spend 35 minutes.

## 2020-10-19 ENCOUNTER — Encounter: Payer: Self-pay | Admitting: Nurse Practitioner

## 2020-10-19 DIAGNOSIS — J40 Bronchitis, not specified as acute or chronic: Secondary | ICD-10-CM | POA: Insufficient documentation

## 2020-10-19 NOTE — Assessment & Plan Note (Signed)
Treated Bronchitis 10/07/20 Prednisone taper dose, Claritin, Mucinex, negative COVID, persisted cough, brownish mucous phlegm, chronic. 10/08/20 CXR no acute pulmonary disease.  Will repeat CXR, speech eval.

## 2020-10-20 DIAGNOSIS — R059 Cough, unspecified: Secondary | ICD-10-CM | POA: Diagnosis not present

## 2020-10-21 DIAGNOSIS — M6281 Muscle weakness (generalized): Secondary | ICD-10-CM | POA: Diagnosis not present

## 2020-10-21 DIAGNOSIS — R52 Pain, unspecified: Secondary | ICD-10-CM | POA: Diagnosis not present

## 2020-10-21 DIAGNOSIS — R293 Abnormal posture: Secondary | ICD-10-CM | POA: Diagnosis not present

## 2020-10-21 DIAGNOSIS — R1313 Dysphagia, pharyngeal phase: Secondary | ICD-10-CM | POA: Diagnosis not present

## 2020-10-22 DIAGNOSIS — M6281 Muscle weakness (generalized): Secondary | ICD-10-CM | POA: Diagnosis not present

## 2020-10-22 DIAGNOSIS — R293 Abnormal posture: Secondary | ICD-10-CM | POA: Diagnosis not present

## 2020-10-22 DIAGNOSIS — R52 Pain, unspecified: Secondary | ICD-10-CM | POA: Diagnosis not present

## 2020-10-22 DIAGNOSIS — R1313 Dysphagia, pharyngeal phase: Secondary | ICD-10-CM | POA: Diagnosis not present

## 2020-10-25 ENCOUNTER — Ambulatory Visit (INDEPENDENT_AMBULATORY_CARE_PROVIDER_SITE_OTHER): Payer: Medicare Other | Admitting: Neurology

## 2020-10-25 DIAGNOSIS — G4733 Obstructive sleep apnea (adult) (pediatric): Secondary | ICD-10-CM | POA: Diagnosis not present

## 2020-10-25 DIAGNOSIS — R293 Abnormal posture: Secondary | ICD-10-CM | POA: Diagnosis not present

## 2020-10-25 DIAGNOSIS — R1313 Dysphagia, pharyngeal phase: Secondary | ICD-10-CM | POA: Diagnosis not present

## 2020-10-25 DIAGNOSIS — Z9989 Dependence on other enabling machines and devices: Secondary | ICD-10-CM

## 2020-10-25 DIAGNOSIS — Z789 Other specified health status: Secondary | ICD-10-CM

## 2020-10-25 DIAGNOSIS — M6281 Muscle weakness (generalized): Secondary | ICD-10-CM | POA: Diagnosis not present

## 2020-10-25 DIAGNOSIS — R52 Pain, unspecified: Secondary | ICD-10-CM | POA: Diagnosis not present

## 2020-10-27 DIAGNOSIS — M6281 Muscle weakness (generalized): Secondary | ICD-10-CM | POA: Diagnosis not present

## 2020-10-27 DIAGNOSIS — R1313 Dysphagia, pharyngeal phase: Secondary | ICD-10-CM | POA: Diagnosis not present

## 2020-10-27 DIAGNOSIS — R52 Pain, unspecified: Secondary | ICD-10-CM | POA: Diagnosis not present

## 2020-10-27 DIAGNOSIS — R293 Abnormal posture: Secondary | ICD-10-CM | POA: Diagnosis not present

## 2020-10-29 DIAGNOSIS — M6281 Muscle weakness (generalized): Secondary | ICD-10-CM | POA: Diagnosis not present

## 2020-10-29 DIAGNOSIS — R293 Abnormal posture: Secondary | ICD-10-CM | POA: Diagnosis not present

## 2020-10-29 DIAGNOSIS — R1313 Dysphagia, pharyngeal phase: Secondary | ICD-10-CM | POA: Diagnosis not present

## 2020-10-29 DIAGNOSIS — R52 Pain, unspecified: Secondary | ICD-10-CM | POA: Diagnosis not present

## 2020-11-01 DIAGNOSIS — R293 Abnormal posture: Secondary | ICD-10-CM | POA: Diagnosis not present

## 2020-11-01 DIAGNOSIS — M6281 Muscle weakness (generalized): Secondary | ICD-10-CM | POA: Diagnosis not present

## 2020-11-01 DIAGNOSIS — R52 Pain, unspecified: Secondary | ICD-10-CM | POA: Diagnosis not present

## 2020-11-01 DIAGNOSIS — R1313 Dysphagia, pharyngeal phase: Secondary | ICD-10-CM | POA: Diagnosis not present

## 2020-11-02 DIAGNOSIS — R52 Pain, unspecified: Secondary | ICD-10-CM | POA: Diagnosis not present

## 2020-11-02 DIAGNOSIS — R1313 Dysphagia, pharyngeal phase: Secondary | ICD-10-CM | POA: Diagnosis not present

## 2020-11-02 DIAGNOSIS — R293 Abnormal posture: Secondary | ICD-10-CM | POA: Diagnosis not present

## 2020-11-02 DIAGNOSIS — M6281 Muscle weakness (generalized): Secondary | ICD-10-CM | POA: Diagnosis not present

## 2020-11-03 DIAGNOSIS — R1313 Dysphagia, pharyngeal phase: Secondary | ICD-10-CM | POA: Diagnosis not present

## 2020-11-03 DIAGNOSIS — A0472 Enterocolitis due to Clostridium difficile, not specified as recurrent: Secondary | ICD-10-CM | POA: Diagnosis not present

## 2020-11-08 NOTE — Procedures (Signed)
There were no data on HST device for review. We will advise this patient to come in for in lab polysomnography. A new CPAP machine is needed.

## 2020-11-08 NOTE — Addendum Note (Signed)
Addended by: Larey Seat on: 11/08/2020 04:33 PM   Modules accepted: Orders

## 2020-11-08 NOTE — Progress Notes (Signed)
No data were recorded for the second attempt at HST. Patient will be asked to come for a night into the sleep lab to obtain finally data that will help to reorder CPAP and supplies for him.

## 2020-11-10 ENCOUNTER — Encounter: Payer: Self-pay | Admitting: Neurology

## 2020-11-11 ENCOUNTER — Telehealth: Payer: Self-pay | Admitting: Neurology

## 2020-11-11 NOTE — Telephone Encounter (Signed)
-----   Message from Larey Seat, MD sent at 11/08/2020  4:33 PM EST ----- No data were recorded for the second attempt at [place]. Patient will be asked to come for a night into the sleep lab to obtain finally data that will help to reorder CPAP and supplies for him.

## 2020-11-11 NOTE — Telephone Encounter (Signed)
I called the [facility], [facility] attempted to transfer to his room so I could discuss results but received a busy tone.  A message was left with front staff for the patient to call back.   ** If pt calls back and I am unable to speak with him, Please make him aware that the recent home sleep test we had him complete was invalid and was never turned on. There was no data received. Advise the patient that since this was 2nd time we tried this we would have to bring him in for a sleep study in lab to ensure a valid study. Our sleep lab will be in touch to get him scheduled.

## 2020-11-15 ENCOUNTER — Non-Acute Institutional Stay (SKILLED_NURSING_FACILITY): Payer: Medicare Other | Admitting: Nurse Practitioner

## 2020-11-15 ENCOUNTER — Telehealth: Payer: Self-pay

## 2020-11-15 ENCOUNTER — Encounter: Payer: Self-pay | Admitting: Nurse Practitioner

## 2020-11-15 DIAGNOSIS — R339 Retention of urine, unspecified: Secondary | ICD-10-CM | POA: Diagnosis not present

## 2020-11-15 DIAGNOSIS — G250 Essential tremor: Secondary | ICD-10-CM | POA: Diagnosis not present

## 2020-11-15 DIAGNOSIS — J40 Bronchitis, not specified as acute or chronic: Secondary | ICD-10-CM

## 2020-11-15 DIAGNOSIS — K219 Gastro-esophageal reflux disease without esophagitis: Secondary | ICD-10-CM

## 2020-11-15 DIAGNOSIS — L0231 Cutaneous abscess of buttock: Secondary | ICD-10-CM

## 2020-11-15 DIAGNOSIS — F339 Major depressive disorder, recurrent, unspecified: Secondary | ICD-10-CM

## 2020-11-15 DIAGNOSIS — G629 Polyneuropathy, unspecified: Secondary | ICD-10-CM | POA: Diagnosis not present

## 2020-11-15 DIAGNOSIS — G4733 Obstructive sleep apnea (adult) (pediatric): Secondary | ICD-10-CM

## 2020-11-15 DIAGNOSIS — L0291 Cutaneous abscess, unspecified: Secondary | ICD-10-CM | POA: Insufficient documentation

## 2020-11-15 DIAGNOSIS — Z9989 Dependence on other enabling machines and devices: Secondary | ICD-10-CM | POA: Diagnosis not present

## 2020-11-15 NOTE — Assessment & Plan Note (Signed)
Neuropathic pain in BLE,takesGabapentin 300mg  bid,200mg  qd, Tylenol 500mg  tid, Norco q6hr prn.c/o R 2nd 3rd hammer toes pain with walking, L 4th, 5th toes pain when touched, right lower back to the right leg pain when sitting in w/c for a long period of time.

## 2020-11-15 NOTE — Assessment & Plan Note (Signed)
GERD, stable, on Pantoprazole 40mg qd.  

## 2020-11-15 NOTE — Assessment & Plan Note (Signed)
The right buttock a golf ball sized boil/abscess, ruptured, pus drainage, warmth, tenderness, redness noted. Will apply warm compress 91min/each, qid x 7 days. Doxycycline 100mg  bid/FloraStor bid x 7 days. Observe.

## 2020-11-15 NOTE — Telephone Encounter (Signed)
Called pt to schedule sleep study. Spoke with KD, RN for patient and she states he is unable to come into sleep lab for sleep study. Please advise next step for patient.

## 2020-11-15 NOTE — Assessment & Plan Note (Signed)
His mood is stable, on Wellbutrin 150mg  bid.

## 2020-11-15 NOTE — Assessment & Plan Note (Signed)
Essential tremor, stable, on Propranolol 10mg  qd.

## 2020-11-15 NOTE — Assessment & Plan Note (Signed)
Foley chronic, f/u Urology 06/10/20. 

## 2020-11-15 NOTE — Progress Notes (Signed)
Location:    [facility] Room Number: 26 Place of Service:  SNF (31) Provider: Lennie Odor Kylan Veach NP  [name], MD  Patient Care Team: [name], MD as PCP - General (Internal Medicine) [name], MD (Hematology and Oncology) [name], MD as Attending Physician (Urology) [name], MD (Gastroenterology)  Extended Emergency Contact Information Primary Emergency Contact: Nole, Robey Mobile Phone: [phone] Relation: Son Preferred language: English Interpreter needed? No Secondary Emergency Contact: Ryser,Janet H Address: [address]          [address]          [place], [place] [address] [name] of [place] Phone: [phone] Mobile Phone: [phone] Relation: Spouse  Code Status:  DNR Goals of care: Advanced Directive information Advanced Directives 09/14/2020  Does Patient Have a Medical Advance Directive? Yes  Type of Paramedic of [place];Living will;Out of facility DNR (pink MOST or yellow form)  Does patient want to make changes to medical advance directive? No - Patient declined  Copy of [place] in Chart? Yes - validated most recent copy scanned in chart (See row information)  Would patient like information on creating a medical advance directive? -  Pre-existing out of facility DNR order (yellow form or pink MOST form) Yellow form placed in chart (order not valid for inpatient use);Pink MOST form placed in chart (order not valid for inpatient use)     Chief Complaint  Patient presents with  . Acute Visit    Right buttock boil    HPI:  Pt is a 86 y.o. male seen today for an acute visit for the right buttock a golf ball sized boil/abscess, ruptured, pus drainage, warmth, tenderness, redness noted.    Treated Bronchitis 10/07/20 Prednisone taper dose, Claritin, Mucinex, negative COVID, persisted cough, brownish mucous, chronic. 10/08/20 CXR no  acute pulmonary disease.               Hx ofRLS/REM, stable, on Requip 2mg  qd. Sleep study 10/25/20 Essential tremor, stable, on Propranolol 10mg  qd.  GERD, stable, on Pantoprazole 40mg  qd.  Neuropathic pain in BLE,takesGabapentin 300mg  bid,200mg  qd, Tylenol 500mg  tid, Norco q6hr prn.c/o R 2nd 3rd hammer toes pain with walking, L 4th, 5th toes pain when touched, right lower back to the right leg pain when sitting in w/c for a long period of time. His mood is stable, on Wellbutrin 150mg  bid. Foley chronic, f/u Urology 06/10/20.     Past Medical History:  Diagnosis Date  . Anemia   . Arthritis   . AVM (arteriovenous malformation)   . Barrett's esophagus   . BPH (benign prostatic hyperplasia)   . Cataract   . Constipation   . CPAP (continuous positive airway pressure) dependence   . Depression   . Diverticulosis    diverticular bleeding  . Diverticulosis   . GERD (gastroesophageal reflux disease)   . History of radiation therapy   . HOH (hard of hearing)    wears hearing aids  . Hyperlipidemia   . Hypoglycemia   . Lymphocytosis 06/2012  . Mastodynia   . Murmur    slight  . Nasal congestion   . Osteoporosis   . Osteoporosis   . Prostate cancer ([place])   . Sleep apnea    wears CPAP  . Urinary retention with incomplete bladder emptying    pt uses bathroom and within 30 mins needs to go again  . Vitamin D deficiency  Past Surgical History:  Procedure Laterality Date  . AMPUTATION TOE Right 09/20/2017   Procedure: Right 4th toe amputation;  Surgeon: Wylene Simmer, MD;  Location: [facility];  Service: Orthopedics;  Laterality: Right;  foot block  . AMPUTATION TOE Right 02/05/2019   Procedure: RIGHT GREAT TOE AMPUTATION;  Surgeon: Leandrew Koyanagi, MD;  Location: [facility];  Service: Orthopedics;  Laterality: Right;  . BACK SURGERY  04/2013  . back surgury     f  .  BLEPHAROPLASTY Bilateral 02/23/15  . CATARACT EXTRACTION    . COLONOSCOPY    . COLONOSCOPY N/A 10/02/2017   Procedure: COLONOSCOPY;  Surgeon: Doran Stabler, MD;  Location: [facility];  Service: Gastroenterology;  Laterality: N/A;  . COLONOSCOPY WITH PROPOFOL N/A 10/01/2017   Procedure: COLONOSCOPY WITH PROPOFOL;  Surgeon: Doran Stabler, MD;  Location: [facility];  Service: Gastroenterology;  Laterality: N/A;  . COLONOSCOPY WITH PROPOFOL N/A 03/07/2018   Procedure: COLONOSCOPY WITH PROPOFOL;  Surgeon: [name], MD;  Location: [facility];  Service: Endoscopy;  Laterality: N/A;  . ESOPHAGOGASTRODUODENOSCOPY (EGD) WITH PROPOFOL N/A 10/01/2017   Procedure: ESOPHAGOGASTRODUODENOSCOPY (EGD) WITH PROPOFOL;  Surgeon: Doran Stabler, MD;  Location: [facility];  Service: Gastroenterology;  Laterality: N/A;  . EYE SURGERY Bilateral   . FINGER SURGERY Right    right thumb  . HARDWARE REMOVAL Right 05/15/2016   Procedure: Right Lumbar Two-Lumbar Five Removal of Hardware;  Surgeon: Kristeen Miss, MD;  Location: [facility];  Service: Neurosurgery;  Laterality: Right;  Right L2-5 removal of hardware  . INGUINAL HERNIA REPAIR     x4  . IR RADIOLOGIST EVAL & MGMT  07/10/2018  . IR RADIOLOGIST EVAL & MGMT  09/05/2018  . IR SACROPLASTY BILATERAL  07/15/2018  . REPAIR SPIGELIAN HERNIA  2011  . [place] ,2014  . TONSILLECTOMY    . UPPER GASTROINTESTINAL ENDOSCOPY    . VASECTOMY      Allergies  Allergen Reactions  . Celebrex [Celecoxib] Other (See Comments)    Created stomach ulcers.  Yvette Rack [Cyclobenzaprine] Other (See Comments)    Caught in hiatal hernia and caused extreme pain    Allergies as of 11/15/2020      Reactions   Celebrex [celecoxib] Other (See Comments)   Created stomach ulcers.   Flexeril [cyclobenzaprine] Other (See Comments)   Caught in hiatal hernia and caused extreme pain      Medication List       Accurate as of November 15, 2020 11:59  PM. If you have any questions, ask your nurse or doctor.        acetaminophen 500 MG tablet Commonly known as: TYLENOL Take 500 mg by mouth 3 (three) times daily.   buPROPion 150 MG 12 hr tablet Commonly known as: WELLBUTRIN SR Take 150 mg by mouth 2 (two) times daily.   carboxymethylcellul-glycerin 0.5-0.9 % ophthalmic solution Commonly known as: REFRESH OPTIVE Place 1 drop into both eyes 3 (three) times daily.   CENTRUM ADULTS PO Take 1 tablet by mouth daily.   PRESERVISION AREDS 2 PO Take 1 capsule by mouth 2 (two) times daily.   cholecalciferol 1000 units tablet Commonly known as: VITAMIN D Take 1,000 Units by mouth daily.   cyanocobalamin 1000 MCG tablet Take 1,000 mcg by mouth daily.   dextromethorphan-guaiFENesin 30-600 MG 12hr tablet Commonly known as: MUCINEX DM Take 1 tablet by mouth 2 (two) times daily.   diclofenac Sodium  1 % Gel Commonly known as: VOLTAREN Apply 2 g topically 4 (four) times daily.   docusate sodium 100 MG capsule Commonly known as: COLACE Take 100 mg by mouth daily.   gabapentin 300 MG capsule Commonly known as: NEURONTIN Take 300 mg by mouth 2 (two) times daily. due to numbness and tingling   gabapentin 100 MG capsule Commonly known as: NEURONTIN Take 200 mg by mouth every morning.   HYDROcodone-acetaminophen 5-325 MG tablet Commonly known as: NORCO/VICODIN Take one tablet by mouth every 6 hours as needed for moderate pain. Do not exceed 4gm of Tylenol in 24 hours.   lactase 3000 units tablet Commonly known as: LACTAID Take 4,500 Units by mouth 3 (three) times daily with meals.   loratadine 10 MG tablet Commonly known as: CLARITIN Take 10 mg by mouth daily.   lovastatin 20 MG tablet Commonly known as: MEVACOR Take 20 mg by mouth at bedtime.   pantoprazole 40 MG tablet Commonly known as: PROTONIX Take 40 mg by mouth every morning.   polyethylene glycol 17 g packet Commonly known as: MIRALAX / GLYCOLAX Take 17 g by  mouth daily as needed.   propranolol 10 MG tablet Commonly known as: INDERAL Take 10 mg by mouth daily.   rOPINIRole 1 MG tablet Commonly known as: REQUIP TAKE 2 TABLETS(2 MG) BY MOUTH AT BEDTIME   zinc oxide 20 % ointment Apply 1 application topically as needed for irritation.       Review of Systems  Constitutional: Negative for activity change, appetite change and fever.  HENT: Positive for hearing loss. Negative for congestion and voice change.   Eyes: Negative for visual disturbance.  Respiratory: Positive for cough. Negative for choking, chest tightness, shortness of breath and wheezing.        Brownish phlegm.   Cardiovascular: Negative for leg swelling.  Gastrointestinal: Negative for abdominal pain.       Fecal incontinence  Genitourinary: Positive for difficulty urinating. Negative for dysuria.       Foley  Musculoskeletal: Positive for arthralgias, back pain, gait problem and neck pain.       R 2nd 3rd hammer toes pain with walking, L 4th, 5th toes pain when touched, right lower back to the right leg pain when sitting in w/c for a long period of time.   Skin: Negative for color change.       The right buttock a golf ball sized boil/abscess, ruptured, pus drainage, warmth, tenderness, redness noted.    Neurological: Positive for tremors. Negative for speech difficulty, light-headedness and headaches.       Memory lapses. Essential tremor, peripheral neuropathy, RLS  Psychiatric/Behavioral: Negative for behavioral problems and sleep disturbance.    Immunization History  Administered Date(s) Administered  . Influenza, High Dose Seasonal PF 09/17/2019  . Influenza-Unspecified 09/16/2018, 09/07/2020  . Moderna Sars-Covid-2 Vaccination 12/08/2019, 01/05/2020  . Pneumococcal Conjugate-13 12/29/2013  . Pneumococcal Polysaccharide-23 09/08/2005  . Tdap 05/25/2005, 08/08/2018  . Zoster Recombinat (Shingrix) 10/17/2017, 01/25/2018   Pertinent  Health Maintenance Due   Topic Date Due  . OPHTHALMOLOGY EXAM  Never done  . URINE MICROALBUMIN  Never done  . FOOT EXAM  09/28/2019  . INFLUENZA VACCINE  Completed  . PNA vac Low Risk Adult  Completed  . HEMOGLOBIN A1C  Discontinued   Fall Risk  01/01/2019 09/24/2018 01/04/2017 10/06/2016 09/29/2016  Falls in the past year? 0 No No No No   Functional Status Survey:    Vitals:   11/15/20 1422  BP: 136/75  Pulse: 61  Resp: (!) 21  Temp: (!) 97 F (36.1 C)  SpO2: 97%  Weight: 160 lb 4.8 oz (72.7 kg)  Height: 5\' 6"  (1.676 m)   Body mass index is 25.87 kg/m. Physical Exam Vitals and nursing note reviewed.  Constitutional:      Appearance: Normal appearance.  HENT:     Head: Normocephalic and atraumatic.     Nose: Nose normal.     Mouth/Throat:     Mouth: Mucous membranes are moist.  Eyes:     Extraocular Movements: Extraocular movements intact.     Conjunctiva/sclera: Conjunctivae normal.     Pupils: Pupils are equal, round, and reactive to light.     Comments: Medical right subconjunctival hemorrhage   Cardiovascular:     Rate and Rhythm: Normal rate and regular rhythm.     Heart sounds: Murmur heard.      Comments: Weak DP pulse on the right Pulmonary:     Effort: Pulmonary effort is normal.     Breath sounds: Rales present. No wheezing or rhonchi.     Comments: Posterior left basilar rales.  Abdominal:     General: Bowel sounds are normal.     Palpations: Abdomen is soft.     Tenderness: There is no abdominal tenderness.  Genitourinary:    Comments: Foley  Musculoskeletal:        General: Tenderness present.     Cervical back: Normal range of motion and neck supple.     Right lower leg: No edema.     Left lower leg: No edema.     Comments: S/p the right great and 4th toe amputation. R 2nd 3rd hammer toes pain with walking, L 4th, 5th toes pain when touched, right lower back to the right leg pain when sitting in w/c for a long period of time.   Skin:    General: Skin is warm and  dry.     Comments: The right buttock a golf ball sized boil/abscess, ruptured, pus drainage, warmth, tenderness, redness noted.   Neurological:     General: No focal deficit present.     Mental Status: He is alert. Mental status is at baseline.     Gait: Gait abnormal.     Comments: Oriented to person, place.   Psychiatric:        Mood and Affect: Mood normal.        Behavior: Behavior normal.        Thought Content: Thought content normal.        Judgment: Judgment normal.     Labs reviewed: Recent Labs    01/15/20 0240 01/16/20 0243 01/17/20 0241 02/09/20 0000 05/06/20 0000 08/16/20 0000  NA 135 135 135 138 139 139  K 3.9 4.2 3.8 4.1 4.2 4.0  CL 111 112* 108 105 106 108  CO2 16* 18* 20* 24* 25* 24*  GLUCOSE 98 126* 100*  --   --   --   BUN 11 11 12 17 15 15   CREATININE 0.74 0.70 0.61 0.7 0.8 0.8  CALCIUM 7.6* 7.6* 7.7* 8.9 8.8 8.6*  MG 1.8 1.8 1.7  --   --   --    Recent Labs    01/12/20 2110 02/09/20 0000 05/06/20 0000 08/16/20 0000  AST 38 31 27 27   ALT 18 10 9* 10  ALKPHOS 76 98 91 91  BILITOT 0.6  --   --   --   PROT 6.5  --   --   --  ALBUMIN 3.0* 3.4* 3.5 3.7   Recent Labs    01/15/20 0240 01/16/20 0243 01/17/20 0241 02/09/20 0000 05/06/20 0000 08/16/20 0000  WBC 18.0* 16.8* 11.6* 6.6 7.8 6.5  NEUTROABS 15.7* 14.4* 9.4* 4,204  --   --   HGB 11.2* 10.7* 10.9* 13.0* 12.9* 12.9*  HCT 33.9* 33.2* 34.0* 41 39* 39*  MCV 91.1 93.0 91.6  --   --   --   PLT 256 252 256 272 224 199   Lab Results  Component Value Date   TSH 2.38 02/09/2020   Lab Results  Component Value Date   HGBA1C 5.2 08/29/2018   Lab Results  Component Value Date   CHOL 158 08/29/2018   HDL 34 (A) 05/06/2020   LDLCALC 103 05/06/2020   TRIG 154 05/06/2020    Significant Diagnostic Results in last 30 days:  Home sleep test  Result Date: 10/25/2020 Larey Seat, MD     11/08/2020  4:28 PM There were no data on HST device for review. We will advise this patient to  come in for in lab polysomnography. A new CPAP machine is needed.    Assessment/Plan Skin abscess The right buttock a golf ball sized boil/abscess, ruptured, pus drainage, warmth, tenderness, redness noted. Will apply warm compress 64min/each, qid x 7 days. Doxycycline 100mg  bid/FloraStor bid x 7 days. Observe.    Bronchitis Treated Bronchitis 10/07/20 Prednisone taper dose, Claritin, Mucinex, negative COVID, persisted cough, brownish mucous, chronic. 10/08/20 CXR no acute pulmonary disease.    OSA on CPAP Hx ofRLS/REM, stable, on Requip 2mg  qd. Sleep study 10/25/20   Essential tremor Essential tremor, stable, on Propranolol 10mg  qd.   GERD GERD, stable, on Pantoprazole 40mg  qd.    Neuropathy Neuropathic pain in BLE,takesGabapentin 300mg  bid,200mg  qd, Tylenol 500mg  tid, Norco q6hr prn.c/o R 2nd 3rd hammer toes pain with walking, L 4th, 5th toes pain when touched, right lower back to the right leg pain when sitting in w/c for a long period of time.   Depression, recurrent ([place]) His mood is stable, on Wellbutrin 150mg  bid.  Urinary retention Foley chronic, f/u Urology 06/10/20.      Family/ staff Communication: plan of care reviewed with the patient and charge nurse   Labs/tests ordered:  none  Time spend 35 minutes.

## 2020-11-15 NOTE — Assessment & Plan Note (Signed)
Treated Bronchitis 10/07/20 Prednisone taper dose, Claritin, Mucinex, negative COVID, persisted cough, brownish mucous, chronic. 10/08/20 CXR no acute pulmonary disease.

## 2020-11-15 NOTE — Assessment & Plan Note (Signed)
Hx ofRLS/REM, stable, on Requip 2mg qd.Sleep study 10/25/20  

## 2020-11-16 ENCOUNTER — Encounter: Payer: Self-pay | Admitting: Neurology

## 2020-11-16 ENCOUNTER — Encounter: Payer: Self-pay | Admitting: Nurse Practitioner

## 2020-11-16 NOTE — Telephone Encounter (Signed)
Attempted to call the son. There was no answer. Was reaching out to a family member due to the lack of assistance we have gotten from [facility] in order to have the patient taken care of with his new machine.  We have attempted 2 HST's which have came back with no data. We are in need of either HST or baseline sleep study in the lab to be completed in order to be able to have medicare continue to cover the cost of his cpap machine.   **When son calls back please see if myself or Meagan Allen in sleep lab is available to discuss further.

## 2020-11-16 NOTE — Telephone Encounter (Signed)
This encounter was created in error - please disregard.

## 2020-11-17 ENCOUNTER — Ambulatory Visit (INDEPENDENT_AMBULATORY_CARE_PROVIDER_SITE_OTHER): Payer: Medicare Other | Admitting: Family Medicine

## 2020-11-17 ENCOUNTER — Other Ambulatory Visit: Payer: Self-pay

## 2020-11-17 DIAGNOSIS — Z9989 Dependence on other enabling machines and devices: Secondary | ICD-10-CM | POA: Diagnosis not present

## 2020-11-17 DIAGNOSIS — G4733 Obstructive sleep apnea (adult) (pediatric): Secondary | ICD-10-CM

## 2020-11-17 NOTE — Telephone Encounter (Signed)
I have reached out to [facility]) ,Margreta Journey, to discuss this further. I just wanted them to review records once more and make sure this repeating of sleep study was absolutely necessary considering the patient is in a facility and it is hard to get what they are wanting.   Per Margreta Journey: "This is what I have notated in his account. I have assigned the sales order that is holding... back to billing. If, for whatever reason, they come back with anything different, I will let you know.  As I see it... he is good to go and only needs to complete his initial CPAP compliance visit asap. This can be a phone visit. This is to document compliance for the new machine that was set up 08/10/2020. After that is completed, at this time I see no need for further sleep study.  I will let you know if anything changes."  I called the facility and was able to schedule the patient for phone visit with Amy today as she was only person with an opening. Apt was needed asap because already over the 90 day mark. Pt will be ready for visit along with nurse and the number to call will be [phone].

## 2020-11-17 NOTE — Progress Notes (Signed)
PATIENT: Charles [name] DOB: 1931/07/30  REASON FOR VISIT: follow up HISTORY FROM: patient  Virtual Visit via Telephone Note  I connected with Charles [name] on 11/17/20 at  3:00 PM EST by telephone and verified that I am speaking with the correct person using two identifiers.   I discussed the limitations, risks, security and privacy concerns of performing an evaluation and management service by telephone and the availability of in person appointments. I also discussed with the patient that there may be a patient responsible charge related to this service. The patient expressed understanding and agreed to proceed.   History of Present Illness:  11/17/20 Charles [name] is a 86 y.o. male here today for follow up for OSA on CPAP.  He reports that he has received his new CPAP machine and doing well.  He admits that he has some difficulty meeting 4-hour compliance but tries to comply.  He denies any difficulty with his machine but does state that he can feel a lot of air leaking from around his mask.  He reports replacing mask but uncertain it has helped.  He is doing fairly well otherwise and without concerns today.  [facility]    Compliance report dated 10/18/2020 through 11/16/2020 reveals that he used CPAP 20 for the past 30 days for compliance of 80%.  He used CPAP greater than 4 hours 22 of the past 30 days for compliance of 73%.  Average usage on days used was 9 hours and 16 minutes.  Residual AHI was 29 on 8 to 17 cm of water pressure and an EPR of one.  Pressure in the 95th percentile of 11.3.  Large leak noted in the 95th percentile of 116.6 L/min.  History (copied from [employer] note on 01/01/2019)  Today 01/01/19 Charles [name] is a 86 year old male patient who presents today for follow up of OSA with CPAP. He is using mask daily but does report that he has had some trouble with an ill fitting mask over the past few months. He was seen by  [facility] and had a refitting about a month ago. He reports that he has felt better since refitting. Compliance reports shows he is using machine 30/30 days for compliance of 100%. He is using machine  Greater than 4 hours 29/30 days for compliance of 97%. He is using machine an average of 7 hours and 2 minutes. His AHI is 10.4 on 8 to 16 cmH2O. His leak in the 95th percentile is 23.8. His average pressure is 15.4. he does feel better. Epworth Sleepiness Scale is 7. He continues ropinirole and gabapentin for restless legs and chronic back pain.   HISTORY 06/12/18 Charles [name] is an 86 year old male with a history of obstructive sleep apnea, restless leg syndrome and chronic back pain. He returns today for follow-up. He remains on gabapentin for neuropathy. He reports that he does not have any burning in the lower extremities. He does report numbness but mainly in the right lower extremity. Denies any significant changes with his gait or balance. He does use a walker when ambulating. Denies any falls. His CPAP download indicates that he uses machine 25 out of 30 days for compliance of 83%. He uses machine greater than 4 hours every night that he use the machine. On average he uses his machine 8 hours and 15 minutes. His residual AHI is 9.5 on 8 to 15 cm of water. His leak in the 95th  percentile is 23.5L/min. Hisaverage pressure is 14.7 cm of water. The patient states that he does not feel the mask leaking at night he states that there are occasions that the tubing disconnect from the mask. He denies any new neurological symptoms. He returns today for an evaluation.  Observations/Objective:  Generalized: Well developed, in no acute distress  Mentation: Alert oriented to time, place, history taking. Follows all commands speech and language fluent   Assessment and Plan:  86 y.o. year old male  has a past medical history of Anemia, Arthritis, AVM (arteriovenous malformation), Barrett's  esophagus, BPH (benign prostatic hyperplasia), Cataract, Constipation, CPAP (continuous positive airway pressure) dependence, Depression, Diverticulosis, Diverticulosis, GERD (gastroesophageal reflux disease), History of radiation therapy, HOH (hard of hearing), Hyperlipidemia, Hypoglycemia, Lymphocytosis (06/2012), Mastodynia, Murmur, Nasal congestion, Osteoporosis, Osteoporosis, Prostate cancer ([place]), Sleep apnea, Urinary retention with incomplete bladder emptying, and Vitamin D deficiency. here with    ICD-10-CM   1. OSA on CPAP  G47.33    Z99.89    Charles [name] has received his new machine and states that it is working well.  He does have concerns of a large air leak that I have identified as well on his compliance report.  Compliance is acceptable from a 4-hour and daily perspective.  AHI is 29 with large leak in the 95th percentile of 116 L/min.  I have placed a mask refitting order today.  I will asked the DME company to contact [facility] and arrange for Charles [name] to be reeducated and assessed for correct mask fit.  He was encouraged to continue using CPAP nightly and for greater than 4 hours each night.  We will repeat download in 4 to 6 weeks to assess leak and AHI following mask refitting.  We will plan to see him back in 1 year, sooner if needed.  He verbalizes understanding and agreement with this plan.   No orders of the defined types were placed in this encounter.   No orders of the defined types were placed in this encounter.    Follow Up Instructions:  I discussed the assessment and treatment plan with the patient. The patient was provided an opportunity to ask questions and all were answered. The patient agreed with the plan and demonstrated an understanding of the instructions.   The patient was advised to call back or seek an in-person evaluation if the symptoms worsen or if the condition fails to improve as anticipated.  I provided 15 minutes of non-face-to-face time during  this encounter.  Patient is located at his place of residence during [place].  Provider is in the office.   Debbora Presto, NP

## 2020-12-07 ENCOUNTER — Encounter: Payer: Self-pay | Admitting: Nurse Practitioner

## 2020-12-07 ENCOUNTER — Non-Acute Institutional Stay (SKILLED_NURSING_FACILITY): Payer: Medicare Other | Admitting: Nurse Practitioner

## 2020-12-07 DIAGNOSIS — G629 Polyneuropathy, unspecified: Secondary | ICD-10-CM

## 2020-12-07 DIAGNOSIS — R5381 Other malaise: Secondary | ICD-10-CM | POA: Diagnosis not present

## 2020-12-07 DIAGNOSIS — F339 Major depressive disorder, recurrent, unspecified: Secondary | ICD-10-CM

## 2020-12-07 DIAGNOSIS — R5383 Other fatigue: Secondary | ICD-10-CM

## 2020-12-07 DIAGNOSIS — G250 Essential tremor: Secondary | ICD-10-CM

## 2020-12-07 DIAGNOSIS — G4733 Obstructive sleep apnea (adult) (pediatric): Secondary | ICD-10-CM | POA: Diagnosis not present

## 2020-12-07 DIAGNOSIS — G4752 REM sleep behavior disorder: Secondary | ICD-10-CM

## 2020-12-07 DIAGNOSIS — Z9989 Dependence on other enabling machines and devices: Secondary | ICD-10-CM

## 2020-12-07 DIAGNOSIS — K219 Gastro-esophageal reflux disease without esophagitis: Secondary | ICD-10-CM

## 2020-12-07 NOTE — Progress Notes (Signed)
Location:    [facility] Room Number: 26 Place of Service:  SNF ([phone]) Provider:  Marda Stalker, Lennie Odor NP  [name], MD  Patient Care Team: [name], MD as PCP - General (Internal Medicine) [name], MD (Hematology and Oncology) [name], MD as Attending Physician (Urology) [name], MD (Gastroenterology)  Extended Emergency Contact Information Primary Emergency Contact: Danh, Bayus Mobile Phone: [phone] Relation: Son Preferred language: English Interpreter needed? No Secondary Emergency Contact: Dang,Janet H Address: [address]          [address]          [place], [place] [address] [name] of [place] Phone: [phone] Mobile Phone: [phone] Relation: Spouse  Code Status:  DNR Goals of care: Advanced Directive information Advanced Directives 09/14/2020  Does Patient Have a Medical Advance Directive? Yes  Type of Paramedic of [place];Living will;Out of facility DNR (pink MOST or yellow form)  Does patient want to make changes to medical advance directive? No - Patient declined  Copy of [place] in Chart? Yes - validated most recent copy scanned in chart (See row information)  Would patient like information on creating a medical advance directive? -  Pre-existing out of facility DNR order (yellow form or pink MOST form) Yellow form placed in chart (order not valid for inpatient use);Pink MOST form placed in chart (order not valid for inpatient use)     Chief Complaint  Patient presents with  . Acute Visit    Fatigue     HPI:  Pt is a 87 y.o. male seen today for an acute visit for c/o fatigue, denied increased cough, chest pain, SOB, constipation, upset stomach, or diarrhea. Stated his Foley cath causes his penial discomfort/pain as usual. He is afebrile.   OSA, saw Neurology 11/17/20, uses CPAP  Hx ofRLS/REM, stable, on Requip 62m qd. Sleep  study 10/25/20 Essential tremor, stable, on Propranolol 158mqd.  GERD, stable, on Pantoprazole 4062md.  Neuropathic pain in BLE,takesGabapentin 300m60md,200mg39m Tylenol 500mg 33m Norco q6hr prn.c/o R 2nd 3rd hammer toes pain with walking, L 4th, 5th toes pain when touched, right lower back to the right leg pain when sitting in w/c for a long period of time. His mood is stable, on Wellbutrin 150mg b7mFoley chronic, f/u Urology 06/10/20.  Past Medical History:  Diagnosis Date  . Anemia   . Arthritis   . AVM (arteriovenous malformation)   . Barrett's esophagus   . BPH (benign prostatic hyperplasia)   . Cataract   . Constipation   . CPAP (continuous positive airway pressure) dependence   . Depression   . Diverticulosis    diverticular bleeding  . Diverticulosis   . GERD (gastroesophageal reflux disease)   . History of radiation therapy   . HOH (hard of hearing)    wears hearing aids  . Hyperlipidemia   . Hypoglycemia   . Lymphocytosis 06/2012  . Mastodynia   . Murmur    slight  . Nasal congestion   . Osteoporosis   . Osteoporosis   . Prostate cancer (HCC)   [place]    wears CPAP  . Urinary retention with incomplete bladder emptying    pt uses bathroom and within 30 mins needs to go again  . Vitamin D deficiency    Past Surgical History:  Procedure Laterality Date  . AMPUTATION TOE Right 09/20/2017   Procedure: Right 4th toe  amputation;  Surgeon: Wylene Simmer, MD;  Location: [facility];  Service: Orthopedics;  Laterality: Right;  foot block  . AMPUTATION TOE Right 02/05/2019   Procedure: RIGHT GREAT TOE AMPUTATION;  Surgeon: Leandrew Koyanagi, MD;  Location: [facility];  Service: Orthopedics;  Laterality: Right;  . BACK SURGERY  04/2013  . back surgury     f  . BLEPHAROPLASTY Bilateral 02/23/15  . CATARACT EXTRACTION    . COLONOSCOPY    . COLONOSCOPY N/A  10/02/2017   Procedure: COLONOSCOPY;  Surgeon: Doran Stabler, MD;  Location: [facility];  Service: Gastroenterology;  Laterality: N/A;  . COLONOSCOPY WITH PROPOFOL N/A 10/01/2017   Procedure: COLONOSCOPY WITH PROPOFOL;  Surgeon: Doran Stabler, MD;  Location: [facility];  Service: Gastroenterology;  Laterality: N/A;  . COLONOSCOPY WITH PROPOFOL N/A 03/07/2018   Procedure: COLONOSCOPY WITH PROPOFOL;  Surgeon: [name], MD;  Location: [facility];  Service: Endoscopy;  Laterality: N/A;  . ESOPHAGOGASTRODUODENOSCOPY (EGD) WITH PROPOFOL N/A 10/01/2017   Procedure: ESOPHAGOGASTRODUODENOSCOPY (EGD) WITH PROPOFOL;  Surgeon: Doran Stabler, MD;  Location: [facility];  Service: Gastroenterology;  Laterality: N/A;  . EYE SURGERY Bilateral   . FINGER SURGERY Right    right thumb  . HARDWARE REMOVAL Right 05/15/2016   Procedure: Right Lumbar Two-Lumbar Five Removal of Hardware;  Surgeon: Kristeen Miss, MD;  Location: [facility];  Service: Neurosurgery;  Laterality: Right;  Right L2-5 removal of hardware  . INGUINAL HERNIA REPAIR     x4  . IR RADIOLOGIST EVAL & MGMT  07/10/2018  . IR RADIOLOGIST EVAL & MGMT  09/05/2018  . IR SACROPLASTY BILATERAL  07/15/2018  . REPAIR SPIGELIAN HERNIA  2011  . [place] ,2014  . TONSILLECTOMY    . UPPER GASTROINTESTINAL ENDOSCOPY    . VASECTOMY      Allergies  Allergen Reactions  . Celebrex [Celecoxib] Other (See Comments)    Created stomach ulcers.  Yvette Rack [Cyclobenzaprine] Other (See Comments)    Caught in hiatal hernia and caused extreme pain    Allergies as of 12/07/2020      Reactions   Celebrex [celecoxib] Other (See Comments)   Created stomach ulcers.   Flexeril [cyclobenzaprine] Other (See Comments)   Caught in hiatal hernia and caused extreme pain      Medication List       Accurate as of December 07, 2020 11:59 PM. If you have any questions, ask your nurse or doctor.        acetaminophen 500 MG  tablet Commonly known as: TYLENOL Take 500 mg by mouth 3 (three) times daily.   buPROPion 150 MG 12 hr tablet Commonly known as: WELLBUTRIN SR Take 150 mg by mouth 2 (two) times daily.   carboxymethylcellul-glycerin 0.5-0.9 % ophthalmic solution Commonly known as: REFRESH OPTIVE Place 1 drop into both eyes 3 (three) times daily.   CENTRUM ADULTS PO Take 1 tablet by mouth daily.   PRESERVISION AREDS 2 PO Take 1 capsule by mouth 2 (two) times daily.   cholecalciferol 1000 units tablet Commonly known as: VITAMIN D Take 1,000 Units by mouth daily.   cyanocobalamin 1000 MCG tablet Take 1,000 mcg by mouth daily.   dextromethorphan-guaiFENesin 30-600 MG 12hr tablet Commonly known as: MUCINEX DM Take 1 tablet by mouth 2 (two) times daily.   diclofenac Sodium 1 % Gel Commonly known as: VOLTAREN Apply 2 g topically 4 (four) times daily.   docusate sodium  100 MG capsule Commonly known as: COLACE Take 100 mg by mouth daily.   gabapentin 300 MG capsule Commonly known as: NEURONTIN Take 300 mg by mouth 2 (two) times daily. due to numbness and tingling   gabapentin 100 MG capsule Commonly known as: NEURONTIN Take 200 mg by mouth every morning.   HYDROcodone-acetaminophen 5-325 MG tablet Commonly known as: NORCO/VICODIN Take one tablet by mouth every 6 hours as needed for moderate pain. Do not exceed 4gm of Tylenol in 24 hours.   lactase 3000 units tablet Commonly known as: LACTAID Take 4,500 Units by mouth 3 (three) times daily with meals.   loratadine 10 MG tablet Commonly known as: CLARITIN Take 10 mg by mouth daily.   lovastatin 20 MG tablet Commonly known as: MEVACOR Take 20 mg by mouth at bedtime.   pantoprazole 40 MG tablet Commonly known as: PROTONIX Take 40 mg by mouth every morning.   polyethylene glycol 17 g packet Commonly known as: MIRALAX / GLYCOLAX Take 17 g by mouth daily as needed.   propranolol 10 MG tablet Commonly known as: INDERAL Take 10 mg  by mouth daily.   rOPINIRole 1 MG tablet Commonly known as: REQUIP TAKE 2 TABLETS(2 MG) BY MOUTH AT BEDTIME   zinc oxide 20 % ointment Apply 1 application topically as needed for irritation.       Review of Systems  Constitutional: Negative for appetite change and fever.       Feel tired.   HENT: Positive for hearing loss. Negative for congestion and voice change.   Eyes: Negative for visual disturbance.  Respiratory: Positive for cough. Negative for shortness of breath and wheezing.        Brownish phlegm.   Cardiovascular: Negative for leg swelling.  Gastrointestinal: Negative for abdominal pain, constipation, nausea and vomiting.       Fecal incontinence  Genitourinary: Positive for difficulty urinating. Negative for dysuria.       Foley  Musculoskeletal: Positive for arthralgias, back pain, gait problem and neck pain.       R 2nd 3rd hammer toes pain with walking, L 4th, 5th toes pain when touched, right lower back to the right leg pain when sitting in w/c for a long period of time.   Skin: Negative for color change.  Neurological: Positive for tremors. Negative for speech difficulty, light-headedness and headaches.       Memory lapses. Essential tremor, peripheral neuropathy, RLS  Psychiatric/Behavioral: Negative for behavioral problems and sleep disturbance.    Immunization History  Administered Date(s) Administered  . Influenza, High Dose Seasonal PF 09/17/2019  . Influenza-Unspecified 09/16/2018, 09/07/2020  . Moderna Sars-Covid-2 Vaccination 12/08/2019, 01/05/2020  . Pneumococcal Conjugate-13 12/29/2013  . Pneumococcal Polysaccharide-23 09/08/2005  . Tdap 05/25/2005, 08/08/2018  . Zoster Recombinat (Shingrix) 10/17/2017, 01/25/2018   Pertinent  Health Maintenance Due  Topic Date Due  . OPHTHALMOLOGY EXAM  Never done  . URINE MICROALBUMIN  Never done  . FOOT EXAM  09/28/2019  . INFLUENZA VACCINE  Completed  . PNA vac Low Risk Adult  Completed  . HEMOGLOBIN A1C   Discontinued   Fall Risk  01/01/2019 09/24/2018 01/04/2017 10/06/2016 09/29/2016  Falls in the past year? 0 No No No No   Functional Status Survey:    Vitals:   12/07/20 1418  BP: (!) 134/58  Pulse: (!) 59  Resp: 20  Temp: (!) 97.3 F (36.3 C)  SpO2: 96%  Weight: 160 lb 12.8 oz (72.9 kg)  Height: 5' 6" (1.676 m)  Body mass index is 25.95 kg/m. Physical Exam Vitals and nursing note reviewed.  Constitutional:      Comments: Feel tired.   HENT:     Head: Normocephalic and atraumatic.     Nose: Nose normal.     Mouth/Throat:     Mouth: Mucous membranes are moist.  Eyes:     Extraocular Movements: Extraocular movements intact.     Conjunctiva/sclera: Conjunctivae normal.     Pupils: Pupils are equal, round, and reactive to light.     Comments: Medical right subconjunctival hemorrhage   Cardiovascular:     Rate and Rhythm: Normal rate and regular rhythm.     Heart sounds: Murmur heard.      Comments: Weak DP pulse on the right Pulmonary:     Effort: Pulmonary effort is normal.     Breath sounds: Rales present. No wheezing or rhonchi.     Comments: Posterior left basilar rales.  Abdominal:     General: Bowel sounds are normal.     Palpations: Abdomen is soft.     Tenderness: There is no abdominal tenderness.  Genitourinary:    Rectum: Normal.     Comments: Foley  Musculoskeletal:        General: Tenderness present.     Cervical back: Normal range of motion and neck supple.     Right lower leg: No edema.     Left lower leg: No edema.     Comments: S/p the right great and 4th toe amputation. R 2nd 3rd hammer toes pain with walking, L 4th, 5th toes pain when touched, right lower back to the right leg pain when sitting in w/c for a long period of time.   Skin:    General: Skin is warm and dry.  Neurological:     General: No focal deficit present.     Mental Status: He is alert. Mental status is at baseline.     Gait: Gait abnormal.     Comments: Oriented to person,  place.   Psychiatric:        Mood and Affect: Mood normal.        Behavior: Behavior normal.        Thought Content: Thought content normal.        Judgment: Judgment normal.     Labs reviewed: Recent Labs    01/15/20 0240 01/16/20 0243 01/17/20 0241 02/09/20 0000 05/06/20 0000 08/16/20 0000  NA 135 135 135 138 139 139  K 3.9 4.2 3.8 4.1 4.2 4.0  CL 111 112* 108 105 106 108  CO2 16* 18* 20* 24* 25* 24*  GLUCOSE 98 126* 100*  --   --   --   BUN _0 CREATININE 0.74 0.70 0.61 0.7 0.8 0.8  CALCIUM 7.6* 7.6* 7.7* 8.9 8.8 8.6*  MG 1.8 1.8 1.7  --   --   --    Recent Labs    01/12/20 2110 02/09/20 0000 05/06/20 0000 08/16/20 0000  AST 38 _1 ALT 18 10 9* 10  ALKPHOS 76 98 91 91  BILITOT 0.6  --   --   --   PROT 6.5  --   --   --   ALBUMIN 3.0* 3.4* 3.5 3.7   Recent Labs    01/15/20 0240 01/16/20 0243 01/17/20 0241 02/09/20 0000 05/06/20 0000 08/16/20 0000  WBC 18.0* 16.8* 11.6* 6.6 7.8 6.5  NEUTROABS 15.7* 14.4* 9.4* 4,204  --   --  HGB 11.2* 10.7* 10.9* 13.0* 12.9* 12.9*  HCT 33.9* 33.2* 34.0* 41 39* 39*  MCV 91.1 93.0 91.6  --   --   --   PLT 256 252 256 272 224 199   Lab Results  Component Value Date   TSH 2.38 02/09/2020   Lab Results  Component Value Date   HGBA1C 5.2 08/29/2018   Lab Results  Component Value Date   CHOL 158 08/29/2018   HDL 34 (A) 05/06/2020   LDLCALC 103 05/06/2020   TRIG 154 05/06/2020    Significant Diagnostic Results in last 30 days:  No results found.  Assessment/Plan Malaise and fatigue c/o fatigue, denied increased cough, chest pain, SOB, constipation, upset stomach, or diarrhea. Stated his Foley cath causes his penial discomfort/pain as usual. He is afebrile. Will obtain CBC/diff, CMP/eGFR, TSH to evaluate further.    OSA on CPAP OSA, saw Neurology 11/17/20, uses CPAP   REM sleep behavior disorder Hx ofRLS/REM, stable, on Requip 26m qd. Sleep study 10/25/20   Essential  tremor Essential tremor, stable, on Propranolol 162mqd.   GERD GERD, stable, on Pantoprazole 4061md.    Neuropathy Neuropathic pain in BLE,takesGabapentin 300m16md,200mg57m Tylenol 500mg 42m Norco q6hr prn.c/o R 2nd 3rd hammer toes pain with walking, L 4th, 5th toes pain when touched, right lower back to the right leg pain when sitting in w/c for a long period of time.   Depression, recurrent (HCC) HSociety Hillmood is stable, on Wellbutrin 150mg b66m    Family/ staff Communication: plan of care reviewed with the patient and charge nurse.   Labs/tests ordered:  CBC/diff, CMP/eGFR, TSH

## 2020-12-07 NOTE — Assessment & Plan Note (Signed)
Hx ofRLS/REM, stable, on Requip 2mg qd.Sleep study 10/25/20  

## 2020-12-07 NOTE — Assessment & Plan Note (Signed)
Neuropathic pain in BLE,takesGabapentin 300mg bid,200mg qd, Tylenol 500mg tid, Norco q6hr prn.c/o R 2nd 3rd hammer toes pain with walking, L 4th, 5th toes pain when touched, right lower back to the right leg pain when sitting in w/c for a long period of time. 

## 2020-12-07 NOTE — Assessment & Plan Note (Signed)
His mood is stable, on Wellbutrin 150mg bid.  

## 2020-12-07 NOTE — Assessment & Plan Note (Signed)
c/o fatigue, denied increased cough, chest pain, SOB, constipation, upset stomach, or diarrhea. Stated his Foley cath causes his penial discomfort/pain as usual. He is afebrile. Will obtain CBC/diff, CMP/eGFR, TSH to evaluate further.

## 2020-12-07 NOTE — Assessment & Plan Note (Signed)
OSA, saw Neurology 11/17/20, uses CPAP  

## 2020-12-07 NOTE — Assessment & Plan Note (Signed)
Essential tremor, stable, on Propranolol 10mg qd.   

## 2020-12-07 NOTE — Assessment & Plan Note (Signed)
GERD, stable, on Pantoprazole 40mg qd.  

## 2020-12-08 ENCOUNTER — Encounter: Payer: Self-pay | Admitting: Nurse Practitioner

## 2020-12-09 DIAGNOSIS — E039 Hypothyroidism, unspecified: Secondary | ICD-10-CM | POA: Diagnosis not present

## 2020-12-09 DIAGNOSIS — F0390 Unspecified dementia without behavioral disturbance: Secondary | ICD-10-CM | POA: Diagnosis not present

## 2020-12-09 DIAGNOSIS — D649 Anemia, unspecified: Secondary | ICD-10-CM | POA: Diagnosis not present

## 2020-12-09 LAB — CBC: RBC: 3.82 — AB (ref 3.87–5.11)

## 2020-12-09 LAB — BASIC METABOLIC PANEL
BUN: 19 (ref 4–21)
CO2: 24 — AB (ref 13–22)
Chloride: 108 (ref 99–108)
Creatinine: 0.9 (ref 0.6–1.3)
Glucose: 68
Potassium: 4.1 (ref 3.4–5.3)
Sodium: 138 (ref 137–147)

## 2020-12-09 LAB — HEPATIC FUNCTION PANEL
ALT: 7 — AB (ref 10–40)
AST: 27 (ref 14–40)
Alkaline Phosphatase: 84 (ref 25–125)
Bilirubin, Total: 0.4

## 2020-12-09 LAB — CBC AND DIFFERENTIAL
HCT: 35 — AB (ref 41–53)
Hemoglobin: 11.5 — AB (ref 13.5–17.5)
Neutrophils Absolute: 4885
Platelets: 202 (ref 150–399)
WBC: 6.9

## 2020-12-09 LAB — TSH: TSH: 2.68 (ref 0.41–5.90)

## 2020-12-09 LAB — COMPREHENSIVE METABOLIC PANEL
Albumin: 3.3 — AB (ref 3.5–5.0)
Calcium: 8.5 — AB (ref 8.7–10.7)
Globulin: 3

## 2020-12-10 LAB — CBC: RBC: 3.82 — AB (ref 3.87–5.11)

## 2020-12-10 LAB — BASIC METABOLIC PANEL
BUN: 19 (ref 4–21)
CO2: 24 — AB (ref 13–22)
Chloride: 108 (ref 99–108)
Creatinine: 0.9 (ref 0.6–1.3)
Glucose: 68
Potassium: 4.1 (ref 3.4–5.3)
Sodium: 138 (ref 137–147)

## 2020-12-10 LAB — COMPREHENSIVE METABOLIC PANEL
Albumin: 3.3 — AB (ref 3.5–5.0)
Calcium: 8.5 — AB (ref 8.7–10.7)
GFR calc Af Amer: 91
GFR calc non Af Amer: 78
Globulin: 3

## 2020-12-10 LAB — CBC AND DIFFERENTIAL
HCT: 35 — AB (ref 41–53)
Hemoglobin: 11.5 — AB (ref 13.5–17.5)
Neutrophils Absolute: 4885
Platelets: 202 (ref 150–399)
WBC: 6.9

## 2020-12-10 LAB — HEPATIC FUNCTION PANEL
ALT: 7 — AB (ref 10–40)
AST: 27 (ref 14–40)
Alkaline Phosphatase: 84 (ref 25–125)
Bilirubin, Total: 0.4

## 2020-12-10 LAB — TSH: TSH: 2.68 (ref 0.41–5.90)

## 2020-12-16 ENCOUNTER — Encounter: Payer: Self-pay | Admitting: Internal Medicine

## 2020-12-16 NOTE — Progress Notes (Signed)
A user error has taken place.

## 2020-12-27 ENCOUNTER — Encounter: Payer: Self-pay | Admitting: Nurse Practitioner

## 2020-12-27 ENCOUNTER — Non-Acute Institutional Stay (SKILLED_NURSING_FACILITY): Payer: Medicare Other | Admitting: Nurse Practitioner

## 2020-12-27 DIAGNOSIS — G4733 Obstructive sleep apnea (adult) (pediatric): Secondary | ICD-10-CM

## 2020-12-27 DIAGNOSIS — R5381 Other malaise: Secondary | ICD-10-CM

## 2020-12-27 DIAGNOSIS — R5383 Other fatigue: Secondary | ICD-10-CM

## 2020-12-27 DIAGNOSIS — J310 Chronic rhinitis: Secondary | ICD-10-CM | POA: Diagnosis not present

## 2020-12-27 DIAGNOSIS — G4752 REM sleep behavior disorder: Secondary | ICD-10-CM | POA: Diagnosis not present

## 2020-12-27 DIAGNOSIS — R630 Anorexia: Secondary | ICD-10-CM | POA: Diagnosis not present

## 2020-12-27 DIAGNOSIS — F339 Major depressive disorder, recurrent, unspecified: Secondary | ICD-10-CM

## 2020-12-27 DIAGNOSIS — Z9989 Dependence on other enabling machines and devices: Secondary | ICD-10-CM

## 2020-12-27 DIAGNOSIS — G250 Essential tremor: Secondary | ICD-10-CM | POA: Diagnosis not present

## 2020-12-27 DIAGNOSIS — R339 Retention of urine, unspecified: Secondary | ICD-10-CM

## 2020-12-27 DIAGNOSIS — K219 Gastro-esophageal reflux disease without esophagitis: Secondary | ICD-10-CM | POA: Diagnosis not present

## 2020-12-27 DIAGNOSIS — G629 Polyneuropathy, unspecified: Secondary | ICD-10-CM

## 2020-12-27 NOTE — Assessment & Plan Note (Signed)
Essential tremor, stable, on Propranolol 10mg  qd. Bun/creat 19/0.9 12/09/20

## 2020-12-27 NOTE — Assessment & Plan Note (Signed)
c/o increased drowsiness, lethargy, and weakness, afebrile, no new focal weakness. The patient denied headache, change of vision, chest pain/pressure, or palpitation.   Will decrease Gabapentin to 329m bid, update CBC/diff, CMP/eGFR.

## 2020-12-27 NOTE — Assessment & Plan Note (Addendum)
Comes and goes, Claritin is not adequate, will add Flonase bid x 2 weeks.

## 2020-12-27 NOTE — Assessment & Plan Note (Addendum)
Neuropathic pain in BLE,reduce Gabapentin 300mg  bid due to lethargy, continue Tylenol 500mg  tid, Norco q6hr prn.c/o R 2nd 3rd hammer toes pain with walking, L 4th, 5th toes pain when touched, right lower back to the right leg pain when sitting in w/c for a long period of time.

## 2020-12-27 NOTE — Assessment & Plan Note (Signed)
GERD, stable, on Pantoprazole 40mg  qd. Hgb 11.5 12/09/20

## 2020-12-27 NOTE — Assessment & Plan Note (Signed)
Continue chronic Foley.

## 2020-12-27 NOTE — Assessment & Plan Note (Signed)
Denied nausea, vomiting, abd pain, constipation, agreed to try Mirtazapine 7.5mg  hs.

## 2020-12-27 NOTE — Assessment & Plan Note (Signed)
His mood is stable, on Wellbutrin 150mg bid.TSH 2.68 12/09/20  

## 2020-12-27 NOTE — Assessment & Plan Note (Signed)
OSA, saw Neurology 11/17/20, uses CPAP  

## 2020-12-27 NOTE — Assessment & Plan Note (Signed)
Hx ofRLS/REM, stable, on Requip 2mg qd.Sleep study 10/25/20  

## 2020-12-27 NOTE — Progress Notes (Signed)
Location:   SNF [facility] Room Number: 14-D Place of Service:  SNF (31) Provider: [facility] Tylene Quashie NP  [name], MD  Patient Care Team: [name], MD as PCP - General (Internal Medicine) [name], MD (Hematology and Oncology) [name], MD as Attending Physician (Urology) [name], MD (Gastroenterology)  Extended Emergency Contact Information Primary Emergency Contact: Oskar, Cretella Mobile Phone: [phone] Relation: Son Preferred language: English Interpreter needed? No Secondary Emergency Contact: Templeman,Janet H Address: [address]          [address]          [place], [place] [address] [name] of [place] Phone: [phone] Mobile Phone: [phone] Relation: Spouse  Code Status:  DNR Goals of care: Advanced Directive information Advanced Directives 12/27/2020  Does Patient Have a Medical Advance Directive? Yes  Type of Paramedic of [place];Living will;Out of facility DNR (pink MOST or yellow form)  Does patient want to make changes to medical advance directive? No - Patient declined  Copy of [place] in Chart? Yes - validated most recent copy scanned in chart (See row information)  Would patient like information on creating a medical advance directive? -  Pre-existing out of facility DNR order (yellow form or pink MOST form) Pink MOST form placed in chart (order not valid for inpatient use)     Chief Complaint  Patient presents with  . Acute Visit    Patient is seen acutely for increased weakness and drowsiness.     HPI:  Pt is a 87 y.o. male seen today for an acute visit for c/o increased drowsiness, lethargy, and weakness, afebrile, no new focal weakness. The patient denied headache, change of vision, chest pain/pressure, or palpitation.    OSA, saw Neurology 11/17/20, uses CPAP             Hx ofRLS/REM, stable, on Requip 86m qd.Sleep study  10/25/20 Essential tremor, stable, on Propranolol 128mqd. Bun/creat 19/0.9 12/09/20 GERD, stable, on Pantoprazole 409md. Hgb 11.5 12/09/20 Neuropathic pain in BLE,takesGabapentin 300m32md, Tylenol 500mg52m, Norco q6hr prn.c/o R 2nd 3rd hammer toes pain with walking, L 4th, 5th toes pain when touched, right lower back to the right leg pain when sitting in w/c for a long period of time. His mood is stable, on Wellbutrin 150mg 107mTSH 2.68 12/09/20 Foley chronic, f/u Urology 06/10/20.     Past Medical History:  Diagnosis Date  . Anemia   . Arthritis   . AVM (arteriovenous malformation)   . Barrett's esophagus   . BPH (benign prostatic hyperplasia)   . Cataract   . Constipation   . CPAP (continuous positive airway pressure) dependence   . Depression   . Diverticulosis    diverticular bleeding  . Diverticulosis   . GERD (gastroesophageal reflux disease)   . History of radiation therapy   . HOH (hard of hearing)    wears hearing aids  . Hyperlipidemia   . Hypoglycemia   . Lymphocytosis 06/2012  . Mastodynia   . Murmur    slight  . Nasal congestion   . Osteoporosis   . Osteoporosis   . Prostate cancer (HCC)  [place]    [place]  . Urinary retention with incomplete bladder emptying    pt uses bathroom and within 30 mins needs to go again  . Vitamin D deficiency    Past Surgical History:  Procedure Laterality Date  .  AMPUTATION TOE Right 09/20/2017   Procedure: Right 4th toe amputation;  Surgeon: Wylene Simmer, MD;  Location: [facility];  Service: Orthopedics;  Laterality: Right;  foot block  . AMPUTATION TOE Right 02/05/2019   Procedure: RIGHT GREAT TOE AMPUTATION;  Surgeon: Leandrew Koyanagi, MD;  Location: [facility];  Service: Orthopedics;  Laterality: Right;  . BACK SURGERY  04/2013  . back surgury     f  . BLEPHAROPLASTY Bilateral 02/23/15  . CATARACT EXTRACTION     . COLONOSCOPY    . COLONOSCOPY N/A 10/02/2017   Procedure: COLONOSCOPY;  Surgeon: Doran Stabler, MD;  Location: [facility];  Service: Gastroenterology;  Laterality: N/A;  . COLONOSCOPY WITH PROPOFOL N/A 10/01/2017   Procedure: COLONOSCOPY WITH PROPOFOL;  Surgeon: Doran Stabler, MD;  Location: [facility];  Service: Gastroenterology;  Laterality: N/A;  . COLONOSCOPY WITH PROPOFOL N/A 03/07/2018   Procedure: COLONOSCOPY WITH PROPOFOL;  Surgeon: [name], MD;  Location: [facility];  Service: Endoscopy;  Laterality: N/A;  . ESOPHAGOGASTRODUODENOSCOPY (EGD) WITH PROPOFOL N/A 10/01/2017   Procedure: ESOPHAGOGASTRODUODENOSCOPY (EGD) WITH PROPOFOL;  Surgeon: Doran Stabler, MD;  Location: [facility];  Service: Gastroenterology;  Laterality: N/A;  . EYE SURGERY Bilateral   . FINGER SURGERY Right    right thumb  . HARDWARE REMOVAL Right 05/15/2016   Procedure: Right Lumbar Two-Lumbar Five Removal of Hardware;  Surgeon: Kristeen Miss, MD;  Location: [facility];  Service: Neurosurgery;  Laterality: Right;  Right L2-5 removal of hardware  . INGUINAL HERNIA REPAIR     x4  . IR RADIOLOGIST EVAL & MGMT  07/10/2018  . IR RADIOLOGIST EVAL & MGMT  09/05/2018  . IR SACROPLASTY BILATERAL  07/15/2018  . REPAIR SPIGELIAN HERNIA  2011  . [place] ,2014  . TONSILLECTOMY    . UPPER GASTROINTESTINAL ENDOSCOPY    . VASECTOMY      Allergies  Allergen Reactions  . Celebrex [Celecoxib] Other (See Comments)    Created stomach ulcers.  Yvette Rack [Cyclobenzaprine] Other (See Comments)    Caught in hiatal hernia and caused extreme pain    Allergies as of 12/27/2020      Reactions   Celebrex [celecoxib] Other (See Comments)   Created stomach ulcers.   Flexeril [cyclobenzaprine] Other (See Comments)   Caught in hiatal hernia and caused extreme pain      Medication List       Accurate as of December 27, 2020 11:59 PM. If you have any questions, ask your nurse or doctor.         acetaminophen 500 MG tablet Commonly known as: TYLENOL Take 500 mg by mouth 3 (three) times daily.   buPROPion 150 MG 12 hr tablet Commonly known as: WELLBUTRIN SR Take 150 mg by mouth 2 (two) times daily.   carboxymethylcellul-glycerin 0.5-0.9 % ophthalmic solution Commonly known as: REFRESH OPTIVE Place 1 drop into both eyes 3 (three) times daily.   CENTRUM ADULTS PO Take 1 tablet by mouth daily.   PRESERVISION AREDS 2 PO Take 1 capsule by mouth 2 (two) times daily.   cholecalciferol 1000 units tablet Commonly known as: VITAMIN D Take 1,000 Units by mouth daily.   cyanocobalamin 1000 MCG tablet Take 1,000 mcg by mouth daily.   dextromethorphan-guaiFENesin 30-600 MG 12hr tablet Commonly known as: MUCINEX DM Take 1 tablet by mouth 2 (two) times daily as needed.   diclofenac Sodium 1 % Gel Commonly known as: VOLTAREN  Apply 2 g topically 4 (four) times daily.   docusate sodium 100 MG capsule Commonly known as: COLACE Take 100 mg by mouth daily.   gabapentin 300 MG capsule Commonly known as: NEURONTIN Take 300 mg by mouth 2 (two) times daily. due to numbness and tingling   gabapentin 100 MG capsule Commonly known as: NEURONTIN Take 200 mg by mouth every morning.   HYDROcodone-acetaminophen 5-325 MG tablet Commonly known as: NORCO/VICODIN Take one tablet by mouth every 6 hours as needed for moderate pain. Do not exceed 4gm of Tylenol in 24 hours.   lactase 3000 units tablet Commonly known as: LACTAID Take 4,500 Units by mouth 3 (three) times daily with meals.   loratadine 10 MG tablet Commonly known as: CLARITIN Take 10 mg by mouth daily.   lovastatin 20 MG tablet Commonly known as: MEVACOR Take 20 mg by mouth at bedtime.   pantoprazole 40 MG tablet Commonly known as: PROTONIX Take 40 mg by mouth every morning.   polyethylene glycol 17 g packet Commonly known as: MIRALAX / GLYCOLAX Take 17 g by mouth daily as needed.   propranolol 10 MG  tablet Commonly known as: INDERAL Take 10 mg by mouth daily.   rOPINIRole 1 MG tablet Commonly known as: REQUIP TAKE 2 TABLETS(2 MG) BY MOUTH AT BEDTIME   zinc oxide 20 % ointment Apply 1 application topically as needed for irritation.       Review of Systems  Constitutional: Positive for activity change, appetite change and fatigue.       Feel tired.   HENT: Positive for congestion and hearing loss. Negative for postnasal drip, sinus pain and voice change.   Eyes: Negative for visual disturbance.  Respiratory: Positive for cough and shortness of breath. Negative for wheezing.        Chronic non productive cough. DOE  Cardiovascular: Negative for leg swelling.  Gastrointestinal: Negative for abdominal pain, constipation, nausea and vomiting.       Fecal incontinence  Genitourinary: Positive for difficulty urinating. Negative for dysuria.       Foley  Musculoskeletal: Positive for arthralgias, back pain, gait problem and neck pain.       R 2nd 3rd hammer toes pain with walking, L 4th, 5th toes pain when touched, right lower back to the right leg pain when sitting in w/c for a long period of time.   Skin: Negative for color change.  Neurological: Positive for tremors. Negative for speech difficulty, light-headedness and headaches.       Memory lapses. Essential tremor, peripheral neuropathy, RLS  Psychiatric/Behavioral: Negative for behavioral problems and sleep disturbance.    Immunization History  Administered Date(s) Administered  . Influenza, High Dose Seasonal PF 09/17/2019  . Influenza-Unspecified 09/16/2018, 09/07/2020  . Moderna Sars-Covid-2 Vaccination 12/08/2019, 01/05/2020  . Pneumococcal Conjugate-13 12/29/2013  . Pneumococcal Polysaccharide-23 09/08/2005  . Tdap 05/25/2005, 08/08/2018  . Zoster Recombinat (Shingrix) 10/17/2017, 01/25/2018   Pertinent  Health Maintenance Due  Topic Date Due  . OPHTHALMOLOGY EXAM  Never done  . URINE MICROALBUMIN  Never done   . FOOT EXAM  09/28/2019  . INFLUENZA VACCINE  Completed  . PNA vac Low Risk Adult  Completed  . HEMOGLOBIN A1C  Discontinued   Fall Risk  01/01/2019 09/24/2018 01/04/2017 10/06/2016 09/29/2016  Falls in the past year? 0 No No No No   Functional Status Survey:    Vitals:   12/27/20 1455  BP: 120/81  Pulse: 62  Resp: 18  Temp: 97.6 F (36.4  C)  TempSrc: Oral  SpO2: 97%  Weight: 160 lb 12.8 oz (72.9 kg)  Height: '5\' 6"'  (1.676 m)   Body mass index is 25.95 kg/m. Physical Exam Vitals and nursing note reviewed.  Constitutional:      Comments: Feel tired.   HENT:     Head: Normocephalic and atraumatic.     Nose: Nose normal. No congestion or rhinorrhea.     Mouth/Throat:     Mouth: Mucous membranes are moist.  Eyes:     Extraocular Movements: Extraocular movements intact.     Conjunctiva/sclera: Conjunctivae normal.     Pupils: Pupils are equal, round, and reactive to light.  Cardiovascular:     Rate and Rhythm: Normal rate and regular rhythm.     Heart sounds: Murmur heard.      Comments: Weak DP pulse on the right Pulmonary:     Effort: Pulmonary effort is normal.     Breath sounds: Rales present. No wheezing or rhonchi.     Comments: Posterior left basilar rales.  Abdominal:     General: Bowel sounds are normal.     Palpations: Abdomen is soft.     Tenderness: There is no abdominal tenderness.  Genitourinary:    Rectum: Normal.     Comments: Foley  Musculoskeletal:        General: Tenderness present.     Cervical back: Normal range of motion and neck supple.     Right lower leg: No edema.     Left lower leg: No edema.     Comments: S/p the right great and 4th toe amputation. R 2nd 3rd hammer toes pain with walking, L 4th, 5th toes pain when touched, right lower back to the right leg pain when sitting in w/c for a long period of time.   Skin:    General: Skin is warm and dry.  Neurological:     General: No focal deficit present.     Mental Status: He is alert.  Mental status is at baseline.     Gait: Gait abnormal.     Comments: Oriented to person, place.   Psychiatric:        Mood and Affect: Mood normal.        Behavior: Behavior normal.        Thought Content: Thought content normal.        Judgment: Judgment normal.     Labs reviewed: Recent Labs    01/15/20 0240 01/16/20 0243 01/17/20 0241 02/09/20 0000 08/16/20 0000 12/09/20 0000 12/10/20 0000  NA 135 135 135   < > 139 138 138  K 3.9 4.2 3.8   < > 4.0 4.1 4.1  CL 111 112* 108   < > 108 108 108  CO2 16* 18* 20*   < > 24* 24* 24*  GLUCOSE 98 126* 100*  --   --   --   --   BUN '11 11 12   ' < > '15 19 19  ' CREATININE 0.74 0.70 0.61   < > 0.8 0.9 0.9  CALCIUM 7.6* 7.6* 7.7*   < > 8.6* 8.5* 8.5*  MG 1.8 1.8 1.7  --   --   --   --    < > = values in this interval not displayed.   Recent Labs    01/12/20 2110 02/09/20 0000 08/16/20 0000 12/09/20 0000 12/10/20 0000  AST 38   < > '27 27 27  ' ALT 18   < > 10 7*  7*  ALKPHOS 76   < > 91 84 84  BILITOT 0.6  --   --   --   --   PROT 6.5  --   --   --   --   ALBUMIN 3.0*   < > 3.7 3.3* 3.3*   < > = values in this interval not displayed.   Recent Labs    01/15/20 0240 01/16/20 0243 01/17/20 0241 02/09/20 0000 05/06/20 0000 08/16/20 0000 12/09/20 0000 12/10/20 0000  WBC 18.0* 16.8* 11.6* 6.6   < > 6.5 6.9 6.9  NEUTROABS 15.7* 14.4* 9.4* 4,204  --   --  4,885.00 4,885.00  HGB 11.2* 10.7* 10.9* 13.0*   < > 12.9* 11.5* 11.5*  HCT 33.9* 33.2* 34.0* 41   < > 39* 35* 35*  MCV 91.1 93.0 91.6  --   --   --   --   --   PLT 256 252 256 272   < > 199 202 202   < > = values in this interval not displayed.   Lab Results  Component Value Date   TSH 2.68 12/10/2020   Lab Results  Component Value Date   HGBA1C 5.2 08/29/2018   Lab Results  Component Value Date   CHOL 158 08/29/2018   HDL 34 (A) 05/06/2020   LDLCALC 103 05/06/2020   TRIG 154 05/06/2020    Significant Diagnostic Results in last 30 days:  No results  found.  Assessment/Plan Malaise and fatigue c/o increased drowsiness, lethargy, and weakness, afebrile, no new focal weakness. The patient denied headache, change of vision, chest pain/pressure, or palpitation.   Will decrease Gabapentin to 333m bid, update CBC/diff, CMP/eGFR.    OSA on CPAP OSA, saw Neurology 11/17/20, uses CPAP   REM sleep behavior disorder Hx ofRLS/REM, stable, on Requip 245mqd.Sleep study 10/25/20   Essential tremor Essential tremor, stable, on Propranolol 105md. Bun/creat 19/0.9 12/09/20  GERD GERD, stable, on Pantoprazole 63m51m. Hgb 11.5 12/09/20   Neuropathy Neuropathic pain in BLE,reduce Gabapentin 300mg71m due to lethargy, continue Tylenol 500mg 34m Norco q6hr prn.c/o R 2nd 3rd hammer toes pain with walking, L 4th, 5th toes pain when touched, right lower back to the right leg pain when sitting in w/c for a long period of time.  Urinary retention Continue chronic Foley.   Depression, recurrent (HCC) HAdrianmood is stable, on Wellbutrin 150mg b34mSH 2.68 12/09/20   Lack of appetite Denied nausea, vomiting, abd pain, constipation, agreed to try Mirtazapine 7.5mg hs.72mRhinitis Comes and goes, Claritin is not adequate, will add Flonase bid x 2 weeks.     Family/ staff Communication: plan of care reviewed with the patient and charge nurse.   Labs/tests ordered:  CBC/diff, CMP/eGFR  Time spend 35 minutes.

## 2020-12-28 ENCOUNTER — Encounter: Payer: Self-pay | Admitting: Nurse Practitioner

## 2020-12-30 DIAGNOSIS — D649 Anemia, unspecified: Secondary | ICD-10-CM | POA: Diagnosis not present

## 2020-12-30 DIAGNOSIS — F0390 Unspecified dementia without behavioral disturbance: Secondary | ICD-10-CM | POA: Diagnosis not present

## 2020-12-30 LAB — BASIC METABOLIC PANEL
BUN: 21 (ref 4–21)
CO2: 20 (ref 13–22)
Chloride: 110 — AB (ref 99–108)
Creatinine: 0.7 (ref 0.6–1.3)
Glucose: 74
Potassium: 4.3 (ref 3.4–5.3)
Sodium: 139 (ref 137–147)

## 2020-12-30 LAB — HEPATIC FUNCTION PANEL
ALT: 10 (ref 10–40)
AST: 30 (ref 14–40)
Alkaline Phosphatase: 91 (ref 25–125)
Bilirubin, Total: 0.4

## 2020-12-30 LAB — CBC AND DIFFERENTIAL
HCT: 36 — AB (ref 41–53)
Hemoglobin: 12 — AB (ref 13.5–17.5)
Neutrophils Absolute: 5775
Platelets: 260 (ref 150–399)
WBC: 8.1

## 2020-12-30 LAB — CBC: RBC: 4.02 (ref 3.87–5.11)

## 2020-12-30 LAB — COMPREHENSIVE METABOLIC PANEL
Albumin: 3.4 — AB (ref 3.5–5.0)
Calcium: 8.7 (ref 8.7–10.7)
Globulin: 3

## 2021-01-06 ENCOUNTER — Telehealth: Payer: Self-pay | Admitting: Family Medicine

## 2021-01-06 NOTE — Telephone Encounter (Addendum)
I sent a CM to [facility] re: this.  Awaiting response. Did hear back, they did not get order.  She will work on this for pt.

## 2021-01-06 NOTE — Telephone Encounter (Signed)
Can you check with DME that mask refitting has occurred. Patient continues to have large air leak and high AHI. Orders were placed for change of mask in 11/2020. [facility] resident that can not get to out office.

## 2021-01-18 ENCOUNTER — Ambulatory Visit: Payer: Self-pay | Admitting: Adult Health

## 2021-01-18 DIAGNOSIS — R2689 Other abnormalities of gait and mobility: Secondary | ICD-10-CM | POA: Diagnosis not present

## 2021-01-18 DIAGNOSIS — R2681 Unsteadiness on feet: Secondary | ICD-10-CM | POA: Diagnosis not present

## 2021-01-18 DIAGNOSIS — R531 Weakness: Secondary | ICD-10-CM | POA: Diagnosis not present

## 2021-01-18 DIAGNOSIS — M6281 Muscle weakness (generalized): Secondary | ICD-10-CM | POA: Diagnosis not present

## 2021-01-18 DIAGNOSIS — R293 Abnormal posture: Secondary | ICD-10-CM | POA: Diagnosis not present

## 2021-01-18 DIAGNOSIS — M25551 Pain in right hip: Secondary | ICD-10-CM | POA: Diagnosis not present

## 2021-01-18 DIAGNOSIS — R52 Pain, unspecified: Secondary | ICD-10-CM | POA: Diagnosis not present

## 2021-01-20 ENCOUNTER — Encounter: Payer: Self-pay | Admitting: Internal Medicine

## 2021-01-20 ENCOUNTER — Non-Acute Institutional Stay (SKILLED_NURSING_FACILITY): Payer: Medicare Other | Admitting: Internal Medicine

## 2021-01-20 DIAGNOSIS — M24575 Contracture, left foot: Secondary | ICD-10-CM | POA: Diagnosis not present

## 2021-01-20 DIAGNOSIS — R293 Abnormal posture: Secondary | ICD-10-CM | POA: Diagnosis not present

## 2021-01-20 DIAGNOSIS — M25551 Pain in right hip: Secondary | ICD-10-CM | POA: Diagnosis not present

## 2021-01-20 DIAGNOSIS — F339 Major depressive disorder, recurrent, unspecified: Secondary | ICD-10-CM | POA: Diagnosis not present

## 2021-01-20 DIAGNOSIS — R531 Weakness: Secondary | ICD-10-CM | POA: Diagnosis not present

## 2021-01-20 DIAGNOSIS — R52 Pain, unspecified: Secondary | ICD-10-CM | POA: Diagnosis not present

## 2021-01-20 DIAGNOSIS — M6281 Muscle weakness (generalized): Secondary | ICD-10-CM | POA: Diagnosis not present

## 2021-01-20 DIAGNOSIS — R2689 Other abnormalities of gait and mobility: Secondary | ICD-10-CM | POA: Diagnosis not present

## 2021-01-20 NOTE — Progress Notes (Signed)
Location:    [facility] Room Number: 26 Place of Service:  SNF [phone]) Provider:  Veleta Miners MD  [name], MD  Patient Care Team: [name], MD as PCP - General (Internal Medicine) [name], MD (Hematology and Oncology) [name], MD as Attending Physician (Urology) [name], MD (Gastroenterology)  Extended Emergency Contact Information Primary Emergency Contact: Corrion, Stirewalt Mobile Phone: [phone] Relation: Son Preferred language: English Interpreter needed? No Secondary Emergency Contact: Disla,Janet H Address: [address]          [address]          [place], [place] [address] [name] Phone: [phone] Mobile Phone: [phone] Relation: Spouse  Code Status:  DNR Goals of care: Advanced Directive information Advanced Directives 12/27/2020  Does Patient Have a Medical Advance Directive? Yes  Type of Paramedic of [place];Living will;Out of facility DNR (pink MOST or yellow form)  Does patient want to make changes to medical advance directive? No - Patient declined  Copy of [place] in Chart? Yes - validated most recent copy scanned in chart (See row information)  Would patient like information on creating a medical advance directive? -  Pre-existing out of facility DNR order (yellow form or pink MOST form) Pink MOST form placed in chart (order not valid for inpatient use)     Chief Complaint  Patient presents with  . Acute Visit    Toe pain     HPI:  Pt is a 87 y.o. male seen today for an acute visit for Toe pain  Patient has number of medical prblems including Chronic Diarrhea, H/O AVM, Barretts Esphagus, OSA on CPAP, Hypertension, h/o Prostate cancer wih Chronic Foley Cathter, Chronic Pain due to Kyphosis andNeuropathyand C Diff colitis S/P Right Great Toe Amputation in 3/20 for Osteomyelitis  Seen at his request as he is  having Toe pain in his left foot His toes are all flexed inside and he is putting pressure on them when he uses his wheelchair Staff is worried that he will cause Ulcer or Wound due to pressure His family has also noticed that he is having more issues with depression Patient denies it but tries to stay in his room more often  Past Medical History:  Diagnosis Date  . Anemia   . Arthritis   . AVM (arteriovenous malformation)   . Barrett's esophagus   . BPH (benign prostatic hyperplasia)   . Cataract   . Constipation   . CPAP (continuous positive airway pressure) dependence   . Depression   . Diverticulosis    diverticular bleeding  . Diverticulosis   . GERD (gastroesophageal reflux disease)   . History of radiation therapy   . HOH (hard of hearing)    wears hearing aids  . Hyperlipidemia   . Hypoglycemia   . Lymphocytosis 06/2012  . Mastodynia   . Murmur    slight  . Nasal congestion   . Osteoporosis   . Osteoporosis   . Prostate cancer ([place])   . Sleep apnea    wears CPAP  . Urinary retention with incomplete bladder emptying    pt uses bathroom and within 30 mins needs to go again  . Vitamin D deficiency    Past Surgical History:  Procedure Laterality Date  . AMPUTATION TOE Right 09/20/2017   Procedure: Right 4th toe amputation;  Surgeon: Wylene Simmer, MD;  Location: [facility];  Service: Orthopedics;  Laterality: Right;  foot block  . AMPUTATION TOE Right 02/05/2019   Procedure: RIGHT GREAT TOE AMPUTATION;  Surgeon: Leandrew Koyanagi, MD;  Location: [facility];  Service: Orthopedics;  Laterality: Right;  . BACK SURGERY  04/2013  . back surgury     f  . BLEPHAROPLASTY Bilateral 02/23/15  . CATARACT EXTRACTION    . COLONOSCOPY    . COLONOSCOPY N/A 10/02/2017   Procedure: COLONOSCOPY;  Surgeon: Doran Stabler, MD;  Location: [facility];  Service: Gastroenterology;  Laterality: N/A;  . COLONOSCOPY WITH PROPOFOL N/A 10/01/2017   Procedure:  COLONOSCOPY WITH PROPOFOL;  Surgeon: Doran Stabler, MD;  [facility];  Service: Gastroenterology;  Laterality: N/A;  . COLONOSCOPY WITH PROPOFOL N/A 03/07/2018   Procedure: COLONOSCOPY WITH PROPOFOL;  Surgeon: [name], MD;  [facility];  Service: Endoscopy;  Laterality: N/A;  . ESOPHAGOGASTRODUODENOSCOPY (EGD) WITH PROPOFOL N/A 10/01/2017   Procedure: ESOPHAGOGASTRODUODENOSCOPY (EGD) WITH PROPOFOL;  Surgeon: Doran Stabler, MD;  [facility];  Service: Gastroenterology;  Laterality: N/A;  . EYE SURGERY Bilateral   . FINGER SURGERY Right    right thumb  . HARDWARE REMOVAL Right 05/15/2016   Procedure: Right Lumbar Two-Lumbar Five Removal of Hardware;  Surgeon: Kristeen Miss, MD;  Location: [facility];  Service: Neurosurgery;  Laterality: Right;  Right L2-5 removal of hardware  . INGUINAL HERNIA REPAIR     x4  . IR RADIOLOGIST EVAL & MGMT  07/10/2018  . IR RADIOLOGIST EVAL & MGMT  09/05/2018  . IR SACROPLASTY BILATERAL  07/15/2018  . REPAIR SPIGELIAN HERNIA  2011  . [place] ,2014  . TONSILLECTOMY    . UPPER GASTROINTESTINAL ENDOSCOPY    . VASECTOMY      Allergies  Allergen Reactions  . Celebrex [Celecoxib] Other (See Comments)    Created stomach ulcers.  Yvette Rack [Cyclobenzaprine] Other (See Comments)    Caught in hiatal hernia and caused extreme pain    Allergies as of 01/20/2021      Reactions   Celebrex [celecoxib] Other (See Comments)   Created stomach ulcers.   Flexeril [cyclobenzaprine] Other (See Comments)   Caught in hiatal hernia and caused extreme pain      Medication List       Accurate as of January 20, 2021  4:02 PM. If you have any questions, ask your nurse or doctor.        acetaminophen 500 MG tablet Commonly known as: TYLENOL Take 500 mg by mouth 3 (three) times daily.   buPROPion 150 MG 12 hr tablet Commonly known as: WELLBUTRIN SR Take 150 mg by mouth 2 (two) times daily.    carboxymethylcellul-glycerin 0.5-0.9 % ophthalmic solution Commonly known as: REFRESH OPTIVE Place 1 drop into both eyes 3 (three) times daily.   CENTRUM ADULTS PO Take 1 tablet by mouth daily.   PRESERVISION AREDS 2 PO Take 1 capsule by mouth 2 (two) times daily.   cholecalciferol 1000 units tablet Commonly known as: VITAMIN D Take 1,000 Units by mouth daily.   cyanocobalamin 1000 MCG tablet Take 1,000 mcg by mouth daily.   dextromethorphan-guaiFENesin 30-600 MG 12hr tablet Commonly known as: MUCINEX DM Take 1 tablet by mouth 2 (two) times daily as needed.   diclofenac Sodium 1 % Gel Commonly known as: VOLTAREN Apply 2 g topically 4 (four) times daily.   docusate sodium 100 MG capsule Commonly known as: COLACE Take 100 mg  by mouth daily.   gabapentin 300 MG capsule Commonly known as: NEURONTIN Take 300 mg by mouth 2 (two) times daily. due to numbness and tingling What changed: Another medication with the same name was removed. Continue taking this medication, and follow the directions you see here. Changed by: [name], MD   HYDROcodone-acetaminophen 5-325 MG tablet Commonly known as: NORCO/VICODIN Take one tablet by mouth every 6 hours as needed for moderate pain. Do not exceed 4gm of Tylenol in 24 hours.   lactase 3000 units tablet Commonly known as: LACTAID Take 4,500 Units by mouth 3 (three) times daily with meals.   loratadine 10 MG tablet Commonly known as: CLARITIN Take 10 mg by mouth daily.   lovastatin 20 MG tablet Commonly known as: MEVACOR Take 20 mg by mouth at bedtime.   mirtazapine 7.5 MG tablet Commonly known as: REMERON Take 7.5 mg by mouth at bedtime.   pantoprazole 40 MG tablet Commonly known as: PROTONIX Take 40 mg by mouth every morning.   polyethylene glycol 17 g packet Commonly known as: MIRALAX / GLYCOLAX Take 17 g by mouth daily as needed.   propranolol 10 MG tablet Commonly known as: INDERAL Take 10 mg by mouth  daily.   rOPINIRole 1 MG tablet Commonly known as: REQUIP TAKE 2 TABLETS(2 MG) BY MOUTH AT BEDTIME   zinc oxide 20 % ointment Apply 1 application topically as needed for irritation.       Review of Systems  Constitutional: Positive for activity change.  HENT: Negative.   Respiratory: Negative.   Cardiovascular: Negative.   Gastrointestinal: Negative.   Genitourinary: Negative.   Musculoskeletal: Positive for arthralgias, gait problem and myalgias.  Skin: Negative.   Neurological: Negative for dizziness.  Psychiatric/Behavioral: Positive for dysphoric mood.    Immunization History  Administered Date(s) Administered  . Influenza, High Dose Seasonal PF 09/17/2019  . Influenza-Unspecified 09/16/2018, 09/07/2020  . Moderna Sars-Covid-2 Vaccination 12/08/2019, 01/05/2020  . Pneumococcal Conjugate-13 12/29/2013  . Pneumococcal Polysaccharide-23 09/08/2005  . Tdap 05/25/2005, 08/08/2018  . Zoster Recombinat (Shingrix) 10/17/2017, 01/25/2018   Pertinent  Health Maintenance Due  Topic Date Due  . OPHTHALMOLOGY EXAM  Never done  . URINE MICROALBUMIN  Never done  . FOOT EXAM  09/28/2019  . INFLUENZA VACCINE  Completed  . PNA vac Low Risk Adult  Completed  . HEMOGLOBIN A1C  Discontinued   Fall Risk  01/01/2019 09/24/2018 01/04/2017 10/06/2016 09/29/2016  Falls in the past year? 0 No No No No   Functional Status Survey:    Vitals:   01/20/21 1527  BP: 114/67  Pulse: 66  Resp: 18  Temp: 97.6 F (36.4 C)  SpO2: 96%  Weight: 156 lb 8 oz (71 kg)  Height: 5\' 6"  (1.676 m)   Body mass index is 25.26 kg/m. Physical Exam Vitals reviewed.  Constitutional:      Appearance: Normal appearance.  HENT:     Head: Normocephalic.     Nose: Nose normal.     Mouth/Throat:     Mouth: Mucous membranes are moist.     Pharynx: Oropharynx is clear.  Cardiovascular:     Rate and Rhythm: Normal rate and regular rhythm.     Pulses: Normal pulses.     Heart sounds: Normal heart sounds.   Pulmonary:     Effort: Pulmonary effort is normal.     Breath sounds: Normal breath sounds.  Abdominal:     General: Abdomen is flat. Bowel sounds are normal.  Palpations: Abdomen is soft.  Musculoskeletal:     Cervical back: Neck supple.     Comments: Left Feet has flexed toes No open area or pressure Wound. Cannot stretch it.   Skin:    General: Skin is warm.  Neurological:     General: No focal deficit present.     Mental Status: He is alert and oriented to person, place, and time.  Psychiatric:        Mood and Affect: Mood normal.        Thought Content: Thought content normal.     Labs reviewed: Recent Labs    12/09/20 0000 12/10/20 0000 12/30/20 0000  NA 138 138 139  K 4.1 4.1 4.3  CL 108 108 110*  CO2 24* 24* 20  BUN 19 19 21   CREATININE 0.9 0.9 0.7  CALCIUM 8.5* 8.5* 8.7   Recent Labs    12/09/20 0000 12/10/20 0000 12/30/20 0000  AST 27 27 30   ALT 7* 7* 10  ALKPHOS 84 84 91  ALBUMIN 3.3* 3.3* 3.4*   Recent Labs    12/09/20 0000 12/10/20 0000 12/30/20 0000  WBC 6.9 6.9 8.1  NEUTROABS 4,885.00 4,885.00 5,775.00  HGB 11.5* 11.5* 12.0*  HCT 35* 35* 36*  PLT 202 202 260   Lab Results  Component Value Date   TSH 2.68 12/10/2020   Lab Results  Component Value Date   HGBA1C 5.2 08/29/2018   Lab Results  Component Value Date   CHOL 158 08/29/2018   HDL 34 (A) 05/06/2020   LDLCALC 103 05/06/2020   TRIG 154 05/06/2020    Significant Diagnostic Results in last 30 days:  No results found.  Assessment/Plan  Flexion contracture of joint of foot, left Refer to Podiatry and his Ortho pedics  Depression, recurrent ([place]) Family trying to convince him to increase dose of Wellbutrin  Other issues Neuropathy On High Dose of Neurontin  Urinary retention Has Foley Cathter Chronic bilateral low back pain with bilateral sciatica On NOcro Tremor, essential On Low dose of Propranol Hyperlipidemia On Mevacor Restless  On Requip Family/  staff Communication:   Labs/tests ordered:

## 2021-01-24 DIAGNOSIS — R2689 Other abnormalities of gait and mobility: Secondary | ICD-10-CM | POA: Diagnosis not present

## 2021-01-24 DIAGNOSIS — M25551 Pain in right hip: Secondary | ICD-10-CM | POA: Diagnosis not present

## 2021-01-24 DIAGNOSIS — R293 Abnormal posture: Secondary | ICD-10-CM | POA: Diagnosis not present

## 2021-01-24 DIAGNOSIS — R531 Weakness: Secondary | ICD-10-CM | POA: Diagnosis not present

## 2021-01-24 DIAGNOSIS — M6281 Muscle weakness (generalized): Secondary | ICD-10-CM | POA: Diagnosis not present

## 2021-01-24 DIAGNOSIS — R52 Pain, unspecified: Secondary | ICD-10-CM | POA: Diagnosis not present

## 2021-01-25 DIAGNOSIS — R293 Abnormal posture: Secondary | ICD-10-CM | POA: Diagnosis not present

## 2021-01-25 DIAGNOSIS — R52 Pain, unspecified: Secondary | ICD-10-CM | POA: Diagnosis not present

## 2021-01-25 DIAGNOSIS — M25551 Pain in right hip: Secondary | ICD-10-CM | POA: Diagnosis not present

## 2021-01-25 DIAGNOSIS — R2689 Other abnormalities of gait and mobility: Secondary | ICD-10-CM | POA: Diagnosis not present

## 2021-01-25 DIAGNOSIS — R531 Weakness: Secondary | ICD-10-CM | POA: Diagnosis not present

## 2021-01-25 DIAGNOSIS — M6281 Muscle weakness (generalized): Secondary | ICD-10-CM | POA: Diagnosis not present

## 2021-01-26 DIAGNOSIS — R52 Pain, unspecified: Secondary | ICD-10-CM | POA: Diagnosis not present

## 2021-01-26 DIAGNOSIS — R2689 Other abnormalities of gait and mobility: Secondary | ICD-10-CM | POA: Diagnosis not present

## 2021-01-26 DIAGNOSIS — M25551 Pain in right hip: Secondary | ICD-10-CM | POA: Diagnosis not present

## 2021-01-26 DIAGNOSIS — R293 Abnormal posture: Secondary | ICD-10-CM | POA: Diagnosis not present

## 2021-01-26 DIAGNOSIS — R531 Weakness: Secondary | ICD-10-CM | POA: Diagnosis not present

## 2021-01-26 DIAGNOSIS — M6281 Muscle weakness (generalized): Secondary | ICD-10-CM | POA: Diagnosis not present

## 2021-01-27 DIAGNOSIS — R531 Weakness: Secondary | ICD-10-CM | POA: Diagnosis not present

## 2021-01-27 DIAGNOSIS — M6281 Muscle weakness (generalized): Secondary | ICD-10-CM | POA: Diagnosis not present

## 2021-01-27 DIAGNOSIS — R52 Pain, unspecified: Secondary | ICD-10-CM | POA: Diagnosis not present

## 2021-01-27 DIAGNOSIS — R2689 Other abnormalities of gait and mobility: Secondary | ICD-10-CM | POA: Diagnosis not present

## 2021-01-27 DIAGNOSIS — M25551 Pain in right hip: Secondary | ICD-10-CM | POA: Diagnosis not present

## 2021-01-27 DIAGNOSIS — R293 Abnormal posture: Secondary | ICD-10-CM | POA: Diagnosis not present

## 2021-01-28 DIAGNOSIS — R2689 Other abnormalities of gait and mobility: Secondary | ICD-10-CM | POA: Diagnosis not present

## 2021-01-28 DIAGNOSIS — R531 Weakness: Secondary | ICD-10-CM | POA: Diagnosis not present

## 2021-01-28 DIAGNOSIS — M6281 Muscle weakness (generalized): Secondary | ICD-10-CM | POA: Diagnosis not present

## 2021-01-28 DIAGNOSIS — R293 Abnormal posture: Secondary | ICD-10-CM | POA: Diagnosis not present

## 2021-01-28 DIAGNOSIS — M25551 Pain in right hip: Secondary | ICD-10-CM | POA: Diagnosis not present

## 2021-01-28 DIAGNOSIS — R52 Pain, unspecified: Secondary | ICD-10-CM | POA: Diagnosis not present

## 2021-01-31 DIAGNOSIS — R531 Weakness: Secondary | ICD-10-CM | POA: Diagnosis not present

## 2021-01-31 DIAGNOSIS — M25551 Pain in right hip: Secondary | ICD-10-CM | POA: Diagnosis not present

## 2021-01-31 DIAGNOSIS — R293 Abnormal posture: Secondary | ICD-10-CM | POA: Diagnosis not present

## 2021-01-31 DIAGNOSIS — M6281 Muscle weakness (generalized): Secondary | ICD-10-CM | POA: Diagnosis not present

## 2021-01-31 DIAGNOSIS — R52 Pain, unspecified: Secondary | ICD-10-CM | POA: Diagnosis not present

## 2021-01-31 DIAGNOSIS — R2689 Other abnormalities of gait and mobility: Secondary | ICD-10-CM | POA: Diagnosis not present

## 2021-02-01 DIAGNOSIS — R2681 Unsteadiness on feet: Secondary | ICD-10-CM | POA: Diagnosis not present

## 2021-02-01 DIAGNOSIS — M6281 Muscle weakness (generalized): Secondary | ICD-10-CM | POA: Diagnosis not present

## 2021-02-01 DIAGNOSIS — R52 Pain, unspecified: Secondary | ICD-10-CM | POA: Diagnosis not present

## 2021-02-01 DIAGNOSIS — R531 Weakness: Secondary | ICD-10-CM | POA: Diagnosis not present

## 2021-02-01 DIAGNOSIS — R2689 Other abnormalities of gait and mobility: Secondary | ICD-10-CM | POA: Diagnosis not present

## 2021-02-01 DIAGNOSIS — R293 Abnormal posture: Secondary | ICD-10-CM | POA: Diagnosis not present

## 2021-02-01 DIAGNOSIS — M25551 Pain in right hip: Secondary | ICD-10-CM | POA: Diagnosis not present

## 2021-02-02 DIAGNOSIS — M25551 Pain in right hip: Secondary | ICD-10-CM | POA: Diagnosis not present

## 2021-02-02 DIAGNOSIS — R52 Pain, unspecified: Secondary | ICD-10-CM | POA: Diagnosis not present

## 2021-02-02 DIAGNOSIS — M6281 Muscle weakness (generalized): Secondary | ICD-10-CM | POA: Diagnosis not present

## 2021-02-02 DIAGNOSIS — R2689 Other abnormalities of gait and mobility: Secondary | ICD-10-CM | POA: Diagnosis not present

## 2021-02-02 DIAGNOSIS — R293 Abnormal posture: Secondary | ICD-10-CM | POA: Diagnosis not present

## 2021-02-02 DIAGNOSIS — R531 Weakness: Secondary | ICD-10-CM | POA: Diagnosis not present

## 2021-02-03 ENCOUNTER — Ambulatory Visit (INDEPENDENT_AMBULATORY_CARE_PROVIDER_SITE_OTHER): Payer: Medicare Other | Admitting: Orthopedic Surgery

## 2021-02-03 ENCOUNTER — Encounter: Payer: Self-pay | Admitting: Orthopedic Surgery

## 2021-02-03 DIAGNOSIS — R2689 Other abnormalities of gait and mobility: Secondary | ICD-10-CM | POA: Diagnosis not present

## 2021-02-03 DIAGNOSIS — R52 Pain, unspecified: Secondary | ICD-10-CM | POA: Diagnosis not present

## 2021-02-03 DIAGNOSIS — M205X1 Other deformities of toe(s) (acquired), right foot: Secondary | ICD-10-CM | POA: Diagnosis not present

## 2021-02-03 DIAGNOSIS — M25551 Pain in right hip: Secondary | ICD-10-CM | POA: Diagnosis not present

## 2021-02-03 DIAGNOSIS — M6281 Muscle weakness (generalized): Secondary | ICD-10-CM | POA: Diagnosis not present

## 2021-02-03 DIAGNOSIS — R293 Abnormal posture: Secondary | ICD-10-CM | POA: Diagnosis not present

## 2021-02-03 DIAGNOSIS — R531 Weakness: Secondary | ICD-10-CM | POA: Diagnosis not present

## 2021-02-04 ENCOUNTER — Encounter: Payer: Self-pay | Admitting: Orthopedic Surgery

## 2021-02-04 DIAGNOSIS — R2689 Other abnormalities of gait and mobility: Secondary | ICD-10-CM | POA: Diagnosis not present

## 2021-02-04 DIAGNOSIS — M25551 Pain in right hip: Secondary | ICD-10-CM | POA: Diagnosis not present

## 2021-02-04 DIAGNOSIS — M6281 Muscle weakness (generalized): Secondary | ICD-10-CM | POA: Diagnosis not present

## 2021-02-04 DIAGNOSIS — R52 Pain, unspecified: Secondary | ICD-10-CM | POA: Diagnosis not present

## 2021-02-04 DIAGNOSIS — R293 Abnormal posture: Secondary | ICD-10-CM | POA: Diagnosis not present

## 2021-02-04 DIAGNOSIS — R531 Weakness: Secondary | ICD-10-CM | POA: Diagnosis not present

## 2021-02-04 NOTE — Progress Notes (Signed)
Office Visit Note   Patient: Charles [name]           Date of Birth: [birth year]           MRN: [id number] Visit Date: 02/03/2021              Requested by: [name], MD [address] [place],  [place] [address] PCP: [name], MD  Chief Complaint  Patient presents with  . Right Foot - Pain      HPI: Patient is an 87 year old gentleman who was seen in consultation for clawing of the toes right foot.  Patient has previously seen Dr.Xu.  Patient states he has pain with weightbearing primarily ambulates in a wheelchair.  Therapy has tried to get him to ambulate but he has pain across the toes with weightbearing.    Assessment & Plan: Visit Diagnoses:  1. Claw toe, acquired, right     Plan: Discussed with the patient and his son the best treatment option would be an amputation of the toes 2 through 5.  Discussed that with his microcirculatory disease and slow capillary refill any type of surgical intervention will be difficult to heal.  Surgery to straighten the toes definitely would not heal.  Patient states that he will call when he has more time to discuss this with his son.  Follow-Up Instructions: Return if symptoms worsen or fail to improve.   Ortho Exam  Patient is alert, oriented, no adenopathy, well-dressed, normal affect, normal respiratory effort. Examination patient has a good anterior tibial pulse bilaterally.  However the capillary refill in the toes of the right foot are greater than 4 seconds is status post amputation of the right great toe he has fixed clawing of the lesser toes without ulceration.  The toes are tender to palpation.  Patient has a good stretch shoe to unload pressure from the toes.  There is fixed clawing of the toes 2 through 5.  Imaging: No results found. No images are attached to the encounter.  Labs: Lab Results  Component Value Date   HGBA1C 5.2 08/29/2018   REPTSTATUS 01/15/2020 FINAL 01/14/2020   CULT  01/14/2020     NO GROWTH Performed at [facility], [place] [address] [place], [place] [address]      Lab Results  Component Value Date   ALBUMIN 3.4 (A) 12/30/2020   ALBUMIN 3.3 (A) 12/10/2020   ALBUMIN 3.3 (A) 12/09/2020    Lab Results  Component Value Date   MG 1.7 01/17/2020   MG 1.8 01/16/2020   MG 1.8 01/15/2020   No results found for: VD25OH  No results found for: PREALBUMIN CBC EXTENDED Latest Ref Rng & Units 12/30/2020 12/10/2020 12/09/2020  WBC - 8.1 6.9 6.9  RBC 3.87 - 5.11 4.02 3.82(A) 3.82(A)  HGB 13.5 - 17.5 12.0(A) 11.5(A) 11.5(A)  HCT 41 - 53 36(A) 35(A) 35(A)  PLT 150 - 399 260 202 202  NEUTROABS - 5,775.00 4,885.00 4,885.00  LYMPHSABS 0.7 - 4.0 K/uL - - -     There is no height or weight on file to calculate BMI.  Orders:  No orders of the defined types were placed in this encounter.  No orders of the defined types were placed in this encounter.    Procedures: No procedures performed  Clinical Data: No additional findings.  ROS:  All other systems negative, except as noted in the HPI. Review of Systems  Objective: Vital Signs: There were no vitals taken  for this visit.  Specialty Comments:  No specialty comments available.  PMFS History: Patient Active Problem List   Diagnosis Date Noted  . Lack of appetite 12/27/2020  . Rhinitis 12/27/2020  . Malaise and fatigue 12/07/2020  . Skin abscess 11/15/2020  . Bronchitis 10/19/2020  . Subconjunctival hemorrhage of right eye 10/01/2020  . Gynecomastia, male 09/28/2020  . Advance care planning 08/06/2020  . Weight loss 03/16/2020  . Pain of toe of right foot 03/01/2020  . C. difficile diarrhea 01/17/2020  . Neck muscle spasm 01/17/2020  . Weakness generalized 01/13/2020  . UTI (urinary tract infection) 01/13/2020  . Pain in right elbow 05/30/2019  . Osteomyelitis of second toe of right foot ([place]) 04/03/2019  . Hammertoe of second toe of right foot 04/03/2019  . Ulcer of second toe of right foot  ([place]) 03/21/2019  . Osteomyelitis of great toe of right foot ([place]) 12/05/2018  . Sacral insufficiency fracture 07/04/2018  . Unsteady gait 07/04/2018  . Mild cognitive impairment 07/04/2018  . Neuropathy 07/04/2018  . CAD (coronary artery disease) 05/29/2018  . AVM (arteriovenous malformation) of colon   . Radiation proctitis   . Amputation of right great toe ([place]) 03/01/2018  . Diverticulosis of colon with hemorrhage   . Acute blood loss anemia   . History of back surgery 10/03/2017  . Rectal bleeding 10/03/2017  . Hematochezia   . Lower GI bleeding 09/29/2017  . Treatment-emergent central sleep apnea 06/25/2017  . Chronic bilateral low back pain with bilateral sciatica 01/17/2017  . Post laminectomy syndrome 01/17/2017  . Chronic pain syndrome 01/17/2017  . Sacroiliitis ([employer]) 01/17/2017  . Piriformis syndrome of right side 01/17/2017  . Malignant neoplasm of prostate ([place]) 07/19/2016  . Fixation hardware in spine 05/15/2016  . Hypoglycemic reaction 02/02/2015  . Essential tremor 12/11/2014  . OSA on CPAP 04/24/2014  . 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 Hypersomnia with sleep apnea 04/24/2014  . Depression, recurrent ([place]) 04/18/2013  . Urinary retention 04/18/2013  . Impingement syndrome of right shoulder 04/14/2013    Class: Acute  . Scoliosis or kyphoscoliosis 04/08/2013    Class: Chronic  . Lumbosacral spondylosis without myelopathy 04/08/2013    Class: Chronic  . Acute respiratory failure ([place]) 04/08/2013  . Essential hypertension 04/08/2013  . Lymphocytosis   . Diaphragmatic hernia 11/21/2010  . DIVERTICULAR DISEASE 11/21/2010  . BARRETT'S ESOPHAGUS 02/22/2010  . GERD 07/20/2008  . INGUINAL HERNIA, LEFT, SMALL 07/20/2008  . OTH VENTRAL HERN W/O MENTION OBSTRUCTION/GANGREN 07/20/2008  . CONSTIPATION 07/20/2008  . DYSPHAGIA 07/20/2008   Past Medical History:  Diagnosis Date  . Anemia   . Arthritis   . AVM (arteriovenous malformation)   . Barrett's  esophagus   . BPH (benign prostatic hyperplasia)   . Cataract   . Constipation   . CPAP (continuous positive airway pressure) dependence   . Depression   . Diverticulosis    diverticular bleeding  . Diverticulosis   . GERD (gastroesophageal reflux disease)   . History of radiation therapy   . HOH (hard of hearing)    wears hearing aids  . Hyperlipidemia   . Hypoglycemia   . Lymphocytosis 06/2012  . Mastodynia   . Murmur    slight  . Nasal congestion   . Osteoporosis   . Osteoporosis   . Prostate cancer ([place])   . Sleep apnea    wears CPAP  . Urinary retention with incomplete bladder emptying    pt uses bathroom and within 30 mins  needs to go again  . Vitamin D deficiency     Family History  Problem Relation Age of Onset  . Prostate cancer Father   . CAD Mother   . Arthritis-Osteo Brother   . Colon cancer Neg Hx   . Stomach cancer Neg Hx     Past Surgical History:  Procedure Laterality Date  . AMPUTATION TOE Right 09/20/2017   Procedure: Right 4th toe amputation;  Surgeon: Wylene Simmer, MD;  Location: [facility];  Service: Orthopedics;  Laterality: Right;  foot block  . AMPUTATION TOE Right 02/05/2019   Procedure: RIGHT GREAT TOE AMPUTATION;  Surgeon: Leandrew Koyanagi, MD;  Location: [facility];  Service: Orthopedics;  Laterality: Right;  . BACK SURGERY  04/2013  . back surgury     f  . BLEPHAROPLASTY Bilateral 02/23/15  . CATARACT EXTRACTION    . COLONOSCOPY    . COLONOSCOPY N/A 10/02/2017   Procedure: COLONOSCOPY;  Surgeon: Doran Stabler, MD;  Location: [facility];  Service: Gastroenterology;  Laterality: N/A;  . COLONOSCOPY WITH PROPOFOL N/A 10/01/2017   Procedure: COLONOSCOPY WITH PROPOFOL;  Surgeon: Doran Stabler, MD;  [facility];  Service: Gastroenterology;  Laterality: N/A;  . COLONOSCOPY WITH PROPOFOL N/A 03/07/2018   Procedure: COLONOSCOPY WITH PROPOFOL;  Surgeon: [name], MD;  [facility];   Service: Endoscopy;  Laterality: N/A;  . ESOPHAGOGASTRODUODENOSCOPY (EGD) WITH PROPOFOL N/A 10/01/2017   Procedure: ESOPHAGOGASTRODUODENOSCOPY (EGD) WITH PROPOFOL;  Surgeon: Doran Stabler, MD;  [facility];  Service: Gastroenterology;  Laterality: N/A;  . EYE SURGERY Bilateral   . FINGER SURGERY Right    right thumb  . HARDWARE REMOVAL Right 05/15/2016   Procedure: Right Lumbar Two-Lumbar Five Removal of Hardware;  Surgeon: Kristeen Miss, MD;  Location: [facility];  Service: Neurosurgery;  Laterality: Right;  Right L2-5 removal of hardware  . INGUINAL HERNIA REPAIR     x4  . IR RADIOLOGIST EVAL & MGMT  07/10/2018  . IR RADIOLOGIST EVAL & MGMT  09/05/2018  . IR SACROPLASTY BILATERAL  07/15/2018  . REPAIR SPIGELIAN HERNIA  2011  . [place] ,2014  . TONSILLECTOMY    . UPPER GASTROINTESTINAL ENDOSCOPY    . VASECTOMY     Social History   Occupational History  . 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 Smoking status: Never Smoker  . Smokeless tobacco: Never Used  Vaping Use  . Vaping Use: Never used  Substance and Sexual Activity  . Alcohol use: No  . Drug use: No  . Sexual activity: Not Currently

## 2021-02-07 DIAGNOSIS — R531 Weakness: Secondary | ICD-10-CM | POA: Diagnosis not present

## 2021-02-07 DIAGNOSIS — M6281 Muscle weakness (generalized): Secondary | ICD-10-CM | POA: Diagnosis not present

## 2021-02-07 DIAGNOSIS — R2689 Other abnormalities of gait and mobility: Secondary | ICD-10-CM | POA: Diagnosis not present

## 2021-02-07 DIAGNOSIS — R52 Pain, unspecified: Secondary | ICD-10-CM | POA: Diagnosis not present

## 2021-02-07 DIAGNOSIS — R293 Abnormal posture: Secondary | ICD-10-CM | POA: Diagnosis not present

## 2021-02-07 DIAGNOSIS — M25551 Pain in right hip: Secondary | ICD-10-CM | POA: Diagnosis not present

## 2021-02-08 DIAGNOSIS — H35042 Retinal micro-aneurysms, unspecified, left eye: Secondary | ICD-10-CM | POA: Diagnosis not present

## 2021-02-08 DIAGNOSIS — H35371 Puckering of macula, right eye: Secondary | ICD-10-CM | POA: Diagnosis not present

## 2021-02-08 DIAGNOSIS — H524 Presbyopia: Secondary | ICD-10-CM | POA: Diagnosis not present

## 2021-02-08 DIAGNOSIS — H353132 Nonexudative age-related macular degeneration, bilateral, intermediate dry stage: Secondary | ICD-10-CM | POA: Diagnosis not present

## 2021-02-09 ENCOUNTER — Non-Acute Institutional Stay (SKILLED_NURSING_FACILITY): Payer: Medicare Other | Admitting: Nurse Practitioner

## 2021-02-09 ENCOUNTER — Encounter: Payer: Self-pay | Admitting: Nurse Practitioner

## 2021-02-09 DIAGNOSIS — K219 Gastro-esophageal reflux disease without esophagitis: Secondary | ICD-10-CM

## 2021-02-09 DIAGNOSIS — G629 Polyneuropathy, unspecified: Secondary | ICD-10-CM

## 2021-02-09 DIAGNOSIS — G250 Essential tremor: Secondary | ICD-10-CM | POA: Diagnosis not present

## 2021-02-09 DIAGNOSIS — R071 Chest pain on breathing: Secondary | ICD-10-CM

## 2021-02-09 DIAGNOSIS — R197 Diarrhea, unspecified: Secondary | ICD-10-CM | POA: Diagnosis not present

## 2021-02-09 DIAGNOSIS — R2689 Other abnormalities of gait and mobility: Secondary | ICD-10-CM | POA: Diagnosis not present

## 2021-02-09 DIAGNOSIS — R079 Chest pain, unspecified: Secondary | ICD-10-CM | POA: Diagnosis not present

## 2021-02-09 DIAGNOSIS — K5900 Constipation, unspecified: Secondary | ICD-10-CM

## 2021-02-09 DIAGNOSIS — R5381 Other malaise: Secondary | ICD-10-CM

## 2021-02-09 DIAGNOSIS — G4739 Other sleep apnea: Secondary | ICD-10-CM

## 2021-02-09 DIAGNOSIS — R293 Abnormal posture: Secondary | ICD-10-CM | POA: Diagnosis not present

## 2021-02-09 DIAGNOSIS — F339 Major depressive disorder, recurrent, unspecified: Secondary | ICD-10-CM

## 2021-02-09 DIAGNOSIS — N390 Urinary tract infection, site not specified: Secondary | ICD-10-CM | POA: Diagnosis not present

## 2021-02-09 DIAGNOSIS — R339 Retention of urine, unspecified: Secondary | ICD-10-CM | POA: Diagnosis not present

## 2021-02-09 DIAGNOSIS — R1010 Upper abdominal pain, unspecified: Secondary | ICD-10-CM | POA: Diagnosis not present

## 2021-02-09 DIAGNOSIS — G4733 Obstructive sleep apnea (adult) (pediatric): Secondary | ICD-10-CM

## 2021-02-09 DIAGNOSIS — M24575 Contracture, left foot: Secondary | ICD-10-CM | POA: Diagnosis not present

## 2021-02-09 DIAGNOSIS — J189 Pneumonia, unspecified organism: Secondary | ICD-10-CM

## 2021-02-09 DIAGNOSIS — R531 Weakness: Secondary | ICD-10-CM | POA: Diagnosis not present

## 2021-02-09 DIAGNOSIS — R109 Unspecified abdominal pain: Secondary | ICD-10-CM | POA: Insufficient documentation

## 2021-02-09 DIAGNOSIS — G4752 REM sleep behavior disorder: Secondary | ICD-10-CM | POA: Diagnosis not present

## 2021-02-09 DIAGNOSIS — R5383 Other fatigue: Secondary | ICD-10-CM

## 2021-02-09 DIAGNOSIS — M6281 Muscle weakness (generalized): Secondary | ICD-10-CM | POA: Diagnosis not present

## 2021-02-09 DIAGNOSIS — R52 Pain, unspecified: Secondary | ICD-10-CM | POA: Diagnosis not present

## 2021-02-09 DIAGNOSIS — M25551 Pain in right hip: Secondary | ICD-10-CM | POA: Diagnosis not present

## 2021-02-09 NOTE — Assessment & Plan Note (Signed)
Worsened today w/o new focal neurological symptoms. Workup ordered at [facility]. The patient declined ED eval.

## 2021-02-09 NOTE — Assessment & Plan Note (Signed)
Foley chronic, f/u Urology 06/10/20.

## 2021-02-09 NOTE — Assessment & Plan Note (Signed)
Essential tremor, stable, on Propranolol 10mg  qd. Bun/creat 21/0.7 12/30/20

## 2021-02-09 NOTE — Assessment & Plan Note (Signed)
Left foot flexion contracture, f/u podiatry, Ortho. 

## 2021-02-09 NOTE — Assessment & Plan Note (Addendum)
Adding MiraLax qd, 02/09/21 X-ray abd moderate to large amount of fecal material in the colon and moderate gaseous distension compatible with adynamic ileus with constipation.

## 2021-02-09 NOTE — Assessment & Plan Note (Signed)
saw Neurology 11/17/20, uses CPAP

## 2021-02-09 NOTE — Assessment & Plan Note (Signed)
His mood is stable, on Wellbutrin 150mg  bid.TSH 2.68 12/09/20

## 2021-02-09 NOTE — Assessment & Plan Note (Signed)
for c/o pain in the anterior lower rib cage and abd, no pain palpated, but with deep breathing, cough, and body movement since this morning, limited oral intake, denied nausea, vomiting, DOE is at his baseline, no new focal neurological symptoms, feels weak, but able to move all limbs. The patient declined ED eval.  Will obtain CBC/diff, CXR ap/lateral, EKG to evaluate further.

## 2021-02-09 NOTE — Assessment & Plan Note (Addendum)
Hx ofRLS/REM, stable, on Requip 2mg  qd.Sleep study 10/25/20

## 2021-02-09 NOTE — Progress Notes (Signed)
Location:   SNF [place] Room Number: 26 Place of Service:  SNF (31) Provider: [facility] Amaya Blakeman NP  [name], MD  Patient Care Team: [name], MD as PCP - General (Internal Medicine) [name], MD (Hematology and Oncology) [name], MD as Attending Physician (Urology) [name], MD (Gastroenterology)  Extended Emergency Contact Information Primary Emergency Contact: Dolan, Xia Mobile Phone: [phone] Relation: Son Preferred language: English Interpreter needed? No Secondary Emergency Contact: Dack,Janet H Address: [address]          [address]          [place], [place] [address] [name] of [place] Phone: [phone] Mobile Phone: [phone] Relation: Spouse  Code Status:  DNR Goals of care: Advanced Directive information Advanced Directives 12/27/2020  Does Patient Have a Medical Advance Directive? Yes  Type of Paramedic of [place];Living will;Out of facility DNR (pink MOST or yellow form)  Does patient want to make changes to medical advance directive? No - Patient declined  Copy of [place] in Chart? Yes - validated most recent copy scanned in chart (See row information)  Would patient like information on creating a medical advance directive? -  Pre-existing out of facility DNR order (yellow form or pink MOST form) Pink MOST form placed in chart (order not valid for inpatient use)     Chief Complaint  Patient presents with  . Acute Visit    Abd vs lower rib cage pain, generalized weakness    HPI:  Pt is a 87 y.o. male seen today for an acute visit for c/o pain in the anterior lower rib cage and abd, no pain palpated, but with deep breathing, cough, and body movement since this morning, limited oral intake, denied nausea, vomiting, DOE is at his baseline, no new focal neurological symptoms, feels weak, but able to move all limbs. The patient declined ED  eval.    OSA, saw Neurology 11/17/20, uses CPAP Hx ofRLS/REM, stable, on Requip 55m qd.Sleep study 10/25/20 Essential tremor, stable, on Propranolol 148mqd. Bun/creat 21/0.7 12/30/20 GERD, stable, on Pantoprazole 4022md. Hgb 12 12/30/20 Neuropathic pain in BLE,takesGabapentin 300m12md, Tylenol 500mg9m, Norco q6hr prn.c/o R 2nd 3rd hammer toes pain with walking, L 4th, 5th toes pain when touched, right lower back to the right leg pain when sitting in w/c for a long period of time. His mood is stable, on Wellbutrin 150mg 82mTSH 2.68 12/09/20 Foley chronic, f/u Urology 06/10/20.  Left foot flexion contracture, f/u podiatry, Ortho.    Past Medical History:  Diagnosis Date  . Anemia   . Arthritis   . AVM (arteriovenous malformation)   . Barrett's esophagus   . BPH (benign prostatic hyperplasia)   . Cataract   . Constipation   . CPAP (continuous positive airway pressure) dependence   . Depression   . Diverticulosis    diverticular bleeding  . Diverticulosis   . GERD (gastroesophageal reflux disease)   . History of radiation therapy   . HOH (hard of hearing)    wears hearing aids  . Hyperlipidemia   . Hypoglycemia   . Lymphocytosis 06/2012  . Mastodynia   . Murmur    slight  . Nasal congestion   . Osteoporosis   . Osteoporosis   . Prostate cancer (HCC)  FriendlySleep apnea    wears CPAP  . Urinary retention with incomplete bladder emptying    pt uses bathroom and  within 30 mins needs to go again  . Vitamin D deficiency    Past Surgical History:  Procedure Laterality Date  . AMPUTATION TOE Right 09/20/2017   Procedure: Right 4th toe amputation;  Surgeon: Wylene Simmer, MD;  Location: [place];  Service: Orthopedics;  Laterality: Right;  foot block  . AMPUTATION TOE Right 02/05/2019   Procedure: RIGHT GREAT TOE AMPUTATION;  Surgeon: Leandrew Koyanagi, MD;  Location: [place];  Service: Orthopedics;  Laterality: Right;  . BACK SURGERY  04/2013  . back surgury     f  . BLEPHAROPLASTY Bilateral 02/23/15  . CATARACT EXTRACTION    . COLONOSCOPY    . COLONOSCOPY N/A 10/02/2017   Procedure: COLONOSCOPY;  Surgeon: Doran Stabler, MD;  Location: [facility];  Service: Gastroenterology;  Laterality: N/A;  . COLONOSCOPY WITH PROPOFOL N/A 10/01/2017   Procedure: COLONOSCOPY WITH PROPOFOL;  Surgeon: Doran Stabler, MD;  Location: [facility];  Service: Gastroenterology;  Laterality: N/A;  . COLONOSCOPY WITH PROPOFOL N/A 03/07/2018   Procedure: COLONOSCOPY WITH PROPOFOL;  Surgeon: [name], MD;  Location: [facility];  Service: Endoscopy;  Laterality: N/A;  . ESOPHAGOGASTRODUODENOSCOPY (EGD) WITH PROPOFOL N/A 10/01/2017   Procedure: ESOPHAGOGASTRODUODENOSCOPY (EGD) WITH PROPOFOL;  Surgeon: Doran Stabler, MD;  Location: [facility];  Service: Gastroenterology;  Laterality: N/A;  . EYE SURGERY Bilateral   . FINGER SURGERY Right    right thumb  . HARDWARE REMOVAL Right 05/15/2016   Procedure: Right Lumbar Two-Lumbar Five Removal of Hardware;  Surgeon: Kristeen Miss, MD;  Location: [facility];  Service: Neurosurgery;  Laterality: Right;  Right L2-5 removal of hardware  . INGUINAL HERNIA REPAIR     x4  . IR RADIOLOGIST EVAL & MGMT  07/10/2018  . IR RADIOLOGIST EVAL & MGMT  09/05/2018  . IR SACROPLASTY BILATERAL  07/15/2018  . REPAIR SPIGELIAN HERNIA  2011  . [place] ,2014  . TONSILLECTOMY    . UPPER GASTROINTESTINAL ENDOSCOPY    . VASECTOMY      Allergies  Allergen Reactions  . Celebrex [Celecoxib] Other (See Comments)    Created stomach ulcers.  Yvette Rack [Cyclobenzaprine] Other (See Comments)    Caught in hiatal hernia and caused extreme pain    Allergies as of 02/09/2021      Reactions   Celebrex [celecoxib] Other (See Comments)   Created stomach ulcers.   Flexeril [cyclobenzaprine] Other (See Comments)   Caught in  hiatal hernia and caused extreme pain      Medication List       Accurate as of February 09, 2021 11:59 PM. If you have any questions, ask your nurse or doctor.        acetaminophen 500 MG tablet Commonly known as: TYLENOL Take 500 mg by mouth 3 (three) times daily.   alum & mag hydroxide-simeth 200-200-20 MG/5ML suspension Commonly known as: MAALOX/MYLANTA Take by mouth every 4 (four) hours as needed for indigestion or heartburn.   buPROPion 150 MG 12 hr tablet Commonly known as: WELLBUTRIN SR Take 150 mg by mouth 2 (two) times daily.   carboxymethylcellul-glycerin 0.5-0.9 % ophthalmic solution Commonly known as: REFRESH OPTIVE Place 1 drop into both eyes 3 (three) times daily.   CENTRUM ADULTS PO Take 1 tablet by mouth daily.   PRESERVISION AREDS 2 PO Take 1 capsule by mouth 2 (two) times daily.   cholecalciferol 1000 units tablet Commonly known as: VITAMIN D Take 1,000  Units by mouth daily.   cyanocobalamin 1000 MCG tablet Take 1,000 mcg by mouth daily.   dextromethorphan-guaiFENesin 30-600 MG 12hr tablet Commonly known as: MUCINEX DM Take 1 tablet by mouth 2 (two) times daily as needed.   diclofenac Sodium 1 % Gel Commonly known as: VOLTAREN Apply 2 g topically 4 (four) times daily.   docusate sodium 100 MG capsule Commonly known as: COLACE Take 100 mg by mouth daily.   gabapentin 300 MG capsule Commonly known as: NEURONTIN Take 300 mg by mouth 2 (two) times daily. due to numbness and tingling   HYDROcodone-acetaminophen 5-325 MG tablet Commonly known as: NORCO/VICODIN Take one tablet by mouth every 6 hours as needed for moderate pain. Do not exceed 4gm of Tylenol in 24 hours.   lactase 3000 units tablet Commonly known as: LACTAID Take 4,500 Units by mouth 3 (three) times daily with meals.   loratadine 10 MG tablet Commonly known as: CLARITIN Take 10 mg by mouth daily.   lovastatin 20 MG tablet Commonly known as: MEVACOR Take 20 mg by mouth at  bedtime.   mirtazapine 7.5 MG tablet Commonly known as: REMERON Take 7.5 mg by mouth at bedtime.   pantoprazole 40 MG tablet Commonly known as: PROTONIX Take 40 mg by mouth every morning.   polyethylene glycol 17 g packet Commonly known as: MIRALAX / GLYCOLAX Take 17 g by mouth daily as needed.   propranolol 10 MG tablet Commonly known as: INDERAL Take 10 mg by mouth daily.   rOPINIRole 1 MG tablet Commonly known as: REQUIP TAKE 2 TABLETS(2 MG) BY MOUTH AT BEDTIME   zinc oxide 20 % ointment Apply 1 application topically as needed for irritation.       Review of Systems  Constitutional: Positive for activity change, appetite change and fatigue. Negative for fever.       Feel tired.   HENT: Positive for congestion and hearing loss. Negative for postnasal drip, sinus pain and voice change.   Eyes: Negative for visual disturbance.  Respiratory: Positive for cough and shortness of breath. Negative for wheezing.        Chronic non productive cough. DOE  Cardiovascular: Positive for chest pain. Negative for leg swelling.  Gastrointestinal: Positive for abdominal pain. Negative for constipation, nausea and vomiting.       Fecal incontinence  Genitourinary: Positive for difficulty urinating. Negative for dysuria.       Foley  Musculoskeletal: Positive for arthralgias, back pain, gait problem and neck pain.       R 2nd 3rd hammer toes pain with walking, L 4th, 5th toes pain when touched, right lower back to the right leg pain when sitting in w/c for a long period of time.   Skin: Negative for color change.  Neurological: Positive for tremors. Negative for speech difficulty, light-headedness and headaches.       Memory lapses. Essential tremor, peripheral neuropathy, RLS  Psychiatric/Behavioral: Negative for behavioral problems and sleep disturbance.    Immunization History  Administered Date(s) Administered  . Influenza, High Dose Seasonal PF 09/17/2019  .  Influenza-Unspecified 09/16/2018, 09/07/2020  . Moderna Sars-Covid-2 Vaccination 12/08/2019, 01/05/2020  . Pneumococcal Conjugate-13 12/29/2013  . Pneumococcal Polysaccharide-23 09/08/2005  . Tdap 05/25/2005, 08/08/2018  . Zoster Recombinat (Shingrix) 10/17/2017, 01/25/2018   Pertinent  Health Maintenance Due  Topic Date Due  . OPHTHALMOLOGY EXAM  Never done  . URINE MICROALBUMIN  Never done  . FOOT EXAM  09/28/2019  . INFLUENZA VACCINE  Completed  . PNA vac  Low Risk Adult  Completed  . HEMOGLOBIN A1C  Discontinued   Fall Risk  01/01/2019 09/24/2018 01/04/2017 10/06/2016 09/29/2016  Falls in the past year? 0 No No No No   Functional Status Survey:    Vitals:   02/09/21 1404  BP: (!) 146/77  Pulse: 69  Resp: 20  Temp: 97.6 F (36.4 C)  SpO2: 94%  Weight: 157 lb 9.6 oz (71.5 kg)  Height: '5\' 6"'  (1.676 m)   Body mass index is 25.44 kg/m. Physical Exam Vitals and nursing note reviewed.  Constitutional:      General: He is not in acute distress.    Appearance: He is ill-appearing.     Comments: Feel tired.   HENT:     Head: Normocephalic and atraumatic.     Nose: Nose normal. No congestion or rhinorrhea.     Mouth/Throat:     Mouth: Mucous membranes are moist.  Eyes:     Extraocular Movements: Extraocular movements intact.     Conjunctiva/sclera: Conjunctivae normal.     Pupils: Pupils are equal, round, and reactive to light.  Cardiovascular:     Rate and Rhythm: Normal rate and regular rhythm.     Heart sounds: Murmur heard.      Comments: Weak DP pulse on the right Pulmonary:     Effort: Pulmonary effort is normal.     Breath sounds: Rales present. No wheezing or rhonchi.     Comments: Posterior left basilar rales. Anterior lower chest wall/rib cage pain not palpated, but reported pain with cough, deep breathing, or body movement.  Chest:     Chest wall: Tenderness present.  Abdominal:     General: Bowel sounds are normal. There is no distension.     Palpations:  Abdomen is soft.     Tenderness: There is abdominal tenderness. There is no right CVA tenderness, left CVA tenderness, guarding or rebound.     Comments: C/o across abd pain with cough, deep breathing, or body movement, no palpated, actually better after x1 Mylanta this morning.   Genitourinary:    Rectum: Normal.     Comments: Foley  Musculoskeletal:        General: Tenderness present.     Cervical back: Normal range of motion and neck supple.     Right lower leg: No edema.     Left lower leg: No edema.     Comments: S/p the right great and 4th toe amputation. R 2nd 3rd hammer toes pain with walking, L 4th, 5th toes pain when touched, right lower back to the right leg pain when sitting in w/c for a long period of time. Left foot flexion contracture.   Skin:    General: Skin is warm and dry.  Neurological:     General: No focal deficit present.     Mental Status: He is alert. Mental status is at baseline.     Gait: Gait abnormal.     Comments: Oriented to person, place.   Psychiatric:        Mood and Affect: Mood normal.        Behavior: Behavior normal.        Thought Content: Thought content normal.        Judgment: Judgment normal.     Labs reviewed: Recent Labs    12/09/20 0000 12/10/20 0000 12/30/20 0000  NA 138 138 139  K 4.1 4.1 4.3  CL 108 108 110*  CO2 24* 24* 20  BUN 19 19 21  CREATININE 0.9 0.9 0.7  CALCIUM 8.5* 8.5* 8.7   Recent Labs    12/09/20 0000 12/10/20 0000 12/30/20 0000  AST '27 27 30  ' ALT 7* 7* 10  ALKPHOS 84 84 91  ALBUMIN 3.3* 3.3* 3.4*   Recent Labs    12/09/20 0000 12/10/20 0000 12/30/20 0000  WBC 6.9 6.9 8.1  NEUTROABS 4,885.00 4,885.00 5,775.00  HGB 11.5* 11.5* 12.0*  HCT 35* 35* 36*  PLT 202 202 260   Lab Results  Component Value Date   TSH 2.68 12/10/2020   Lab Results  Component Value Date   HGBA1C 5.2 08/29/2018   Lab Results  Component Value Date   CHOL 158 08/29/2018   HDL 34 (A) 05/06/2020   LDLCALC 103  05/06/2020   TRIG 154 05/06/2020    Significant Diagnostic Results in last 30 days:  No results found.  Assessment/Plan Chest pain for c/o pain in the anterior lower rib cage and abd, no pain palpated, but with deep breathing, cough, and body movement since this morning, limited oral intake, denied nausea, vomiting, DOE is at his baseline, no new focal neurological symptoms, feels weak, but able to move all limbs. The patient declined ED eval.  Will obtain CBC/diff, CXR ap/lateral, EKG to evaluate further.    Abdominal pain c/o pain in the anterior lower rib cage and abd, no pain palpated, but with deep breathing, cough, and body movement since this morning, limited oral intake, denied nausea, vomiting. Staff reported the patient received Mylanta early after c/o abd pain, then went to sleep for a while. The patient doesn't remember his last BM, stated he maybe constipated. Will schedule MiraLax daily. Obtain X-ray KUB, CMP/eGFR, UA C/S in setting of chronic Foley use.    Malaise and fatigue Worsened today w/o new focal neurological symptoms. Workup ordered at [facility] The patient declined ED eval.   REM sleep behavior disorder Hx ofRLS/REM, stable, on Requip 40m qd.Sleep study 10/25/20   Treatment-emergent central sleep apnea saw Neurology 11/17/20, uses CPAP  Essential tremor Essential tremor, stable, on Propranolol 197mqd. Bun/creat 21/0.7 12/30/20   GERD  stable, on Pantoprazole 405md. Hgb 12 12/30/20   Neuropathy Neuropathic pain in BLE,takesGabapentin 300m39md, Tylenol 500mg61m, Norco q6hr prn.c/o R 2nd 3rd hammer toes pain with walking, L 4th, 5th toes pain when touched, right lower back to the right leg pain when sitting in w/c for a long period of time.   Depression, recurrent (HCC) [place] mood is stable, on Wellbutrin 150mg 61mTSH 2.68 12/09/20   Constipation Adding MiraLax qd, 02/09/21 X-ray abd moderate to large amount of fecal material in the colon and  moderate gaseous distension compatible with adynamic ileus with constipation.   Urinary retention Foley chronic, f/u Urology 06/10/20.  Flexion contracture of joint of foot, left Left foot flexion contracture, f/u podiatry, Ortho.     Pneumonia involving left lung CXR 02/09/21 pulmonary infiltrate in the left lung base, wbc 12.4, neutrophils 85.8% 02/09/21. Rocephin 1gm IM x 5 days initiated.     Family/ staff Communication: plan of care reviewed with the patient and charge nurse.   Labs/tests ordered:  CBC/diff, CMP/eGFR, UA C/S, EKG, CXR ap/lateral, X-ray KUB  Time spend 35 minutes.

## 2021-02-09 NOTE — Assessment & Plan Note (Signed)
stable, on Pantoprazole 40mg  qd. Hgb 12 12/30/20

## 2021-02-09 NOTE — Assessment & Plan Note (Signed)
c/o pain in the anterior lower rib cage and abd, no pain palpated, but with deep breathing, cough, and body movement since this morning, limited oral intake, denied nausea, vomiting. Staff reported the patient received Mylanta early after c/o abd pain, then went to sleep for a while. The patient doesn't remember his last BM, stated he maybe constipated. Will schedule MiraLax daily. Obtain X-ray KUB, CMP/eGFR, UA C/S in setting of chronic Foley use.

## 2021-02-09 NOTE — Assessment & Plan Note (Signed)
Neuropathic pain in BLE, takes Gabapentin 300mg bid,  Tylenol 500mg tid, Norco q6hr prn. c/o R 2nd 3rd hammer toes pain with walking, L 4th, 5th toes pain when touched, right lower back to the right leg pain when sitting in w/c for a long period of time.  

## 2021-02-10 DIAGNOSIS — J189 Pneumonia, unspecified organism: Secondary | ICD-10-CM | POA: Insufficient documentation

## 2021-02-10 NOTE — Assessment & Plan Note (Signed)
CXR 02/09/21 pulmonary infiltrate in the left lung base, wbc 12.4, neutrophils 85.8% 02/09/21. Rocephin 1gm IM x 5 days initiated.

## 2021-02-11 ENCOUNTER — Non-Acute Institutional Stay (SKILLED_NURSING_FACILITY): Payer: Medicare Other | Admitting: Nurse Practitioner

## 2021-02-11 DIAGNOSIS — M24575 Contracture, left foot: Secondary | ICD-10-CM | POA: Diagnosis not present

## 2021-02-11 DIAGNOSIS — F339 Major depressive disorder, recurrent, unspecified: Secondary | ICD-10-CM | POA: Diagnosis not present

## 2021-02-11 DIAGNOSIS — G4752 REM sleep behavior disorder: Secondary | ICD-10-CM | POA: Diagnosis not present

## 2021-02-11 DIAGNOSIS — Z9989 Dependence on other enabling machines and devices: Secondary | ICD-10-CM | POA: Diagnosis not present

## 2021-02-11 DIAGNOSIS — R531 Weakness: Secondary | ICD-10-CM | POA: Diagnosis not present

## 2021-02-11 DIAGNOSIS — G4733 Obstructive sleep apnea (adult) (pediatric): Secondary | ICD-10-CM | POA: Diagnosis not present

## 2021-02-11 DIAGNOSIS — K5900 Constipation, unspecified: Secondary | ICD-10-CM | POA: Diagnosis not present

## 2021-02-11 DIAGNOSIS — G629 Polyneuropathy, unspecified: Secondary | ICD-10-CM

## 2021-02-11 DIAGNOSIS — K219 Gastro-esophageal reflux disease without esophagitis: Secondary | ICD-10-CM

## 2021-02-11 DIAGNOSIS — R339 Retention of urine, unspecified: Secondary | ICD-10-CM

## 2021-02-11 DIAGNOSIS — G250 Essential tremor: Secondary | ICD-10-CM | POA: Diagnosis not present

## 2021-02-11 DIAGNOSIS — J189 Pneumonia, unspecified organism: Secondary | ICD-10-CM | POA: Diagnosis not present

## 2021-02-11 LAB — CBC AND DIFFERENTIAL
HCT: 38 — AB (ref 41–53)
Hemoglobin: 12.7 — AB (ref 13.5–17.5)
Neutrophils Absolute: 10639
Platelets: 260 (ref 150–399)
WBC: 12.4

## 2021-02-11 LAB — HEPATIC FUNCTION PANEL
ALT: 9 — AB (ref 10–40)
AST: 29 (ref 14–40)
Alkaline Phosphatase: 91 (ref 25–125)
Bilirubin, Total: 0.6

## 2021-02-11 LAB — BASIC METABOLIC PANEL
BUN: 21 (ref 4–21)
CO2: 23 — AB (ref 13–22)
Chloride: 106 (ref 99–108)
Creatinine: 0.9 (ref 0.6–1.3)
Glucose: 133
Potassium: 4.3 (ref 3.4–5.3)
Sodium: 137 (ref 137–147)

## 2021-02-11 LAB — COMPREHENSIVE METABOLIC PANEL
Albumin: 3.6 (ref 3.5–5.0)
Calcium: 8.7 (ref 8.7–10.7)
Globulin: 3.2

## 2021-02-11 LAB — CBC: RBC: 4.16 (ref 3.87–5.11)

## 2021-02-11 NOTE — Progress Notes (Signed)
Location:    [place] Room Number: 26 Place of Service:  SNF (31) Provider: Lennie Odor Wali Reinheimer NP  [name], MD  Patient Care Team: [name], MD as PCP - General (Internal Medicine) [name], MD (Hematology and Oncology) [name], MD as Attending Physician (Urology) [name], MD (Gastroenterology)  Extended Emergency Contact Information Primary Emergency Contact: Dvaughn, Fickle Mobile Phone: [phone] Relation: Son Preferred language: English Interpreter needed? No Secondary Emergency Contact: Jakes,Janet H Address: [address]          [address]          [place], [place] [address] [name] of [place] Phone: [phone] Mobile Phone: [phone] Relation: Spouse  Code Status:  DNR Goals of care: Advanced Directive information Advanced Directives 12/27/2020  Does Patient Have a Medical Advance Directive? Yes  Type of Paramedic of [place];Living will;Out of facility DNR (pink MOST or yellow form)  Does patient want to make changes to medical advance directive? No - Patient declined  Copy of [place] in Chart? Yes - validated most recent copy scanned in chart (See row information)  Would patient like information on creating a medical advance directive? -  Pre-existing out of facility DNR order (yellow form or pink MOST form) Pink MOST form placed in chart (order not valid for inpatient use)     Chief Complaint  Patient presents with  . Acute Visit    Generalized weakness, follow up on pneumonia    HPI:  Pt is a 87 y.o. male seen today for an acute visit for pneumonia, generalized weakness, and constipation. 02/09/21 c/o pain in th anterior lower ribs cage, abd, worsened with deep breathing, coughing, body movement. X-ray chest and abd showed PNA, constipation Rocephin qd x5 days, MiraLax qd started subsequently. The patient has improved cough. No  constipation, resolved pain in rib cage and abd. He is afebrile, no O2 desaturation, more alert, but still weak in general.   OSA, saw Neurology 11/17/20, uses CPAP Hx ofRLE/REM, stable, on Requip 2mg  qd.Sleep study 10/25/20 Essential tremor, stable, on Propranolol 10mg  qd.Bun/creat 16/0.8 02/14/21 GERD, stable, on Pantoprazole 40mg  qd.Hgb 12.9 02/14/21 Neuropathic pain in BLE,takesGabapentin 300mg  bid, Tylenol 500mg  tid, Norco q6hr prn.c/o R 2nd 3rd hammer toes pain with walking, L 4th, 5th toes pain when touched, right lower back to the right leg pain when sitting in w/c for a long period of time. His mood is stable, on Wellbutrin 150mg  bid.TSH 2.68 12/09/20 Foley chronic, f/u Urology 06/10/20.             Left foot flexion contracture, f/u podiatry, Ortho.       Past Medical History:  Diagnosis Date  . Anemia   . Arthritis   . AVM (arteriovenous malformation)   . Barrett's esophagus   . BPH (benign prostatic hyperplasia)   . Cataract   . Constipation   . CPAP (continuous positive airway pressure) dependence   . Depression   . Diverticulosis    diverticular bleeding  . Diverticulosis   . GERD (gastroesophageal reflux disease)   . History of radiation therapy   . HOH (hard of hearing)    wears hearing aids  . Hyperlipidemia   . Hypoglycemia   . Lymphocytosis 06/2012  . Mastodynia   . Murmur    slight  . Nasal congestion   . Osteoporosis   . Osteoporosis   . Prostate cancer ([place])   .  Sleep apnea    wears CPAP  . Urinary retention with incomplete bladder emptying    pt uses bathroom and within 30 mins needs to go again  . Vitamin D deficiency    Past Surgical History:  Procedure Laterality Date  . AMPUTATION TOE Right 09/20/2017   Procedure: Right 4th toe amputation;  Surgeon: Toni Arthurs, MD;  Location: [facility];  Service: Orthopedics;  Laterality: Right;  foot block   . AMPUTATION TOE Right 02/05/2019   Procedure: RIGHT GREAT TOE AMPUTATION;  Surgeon: Tarry Kos, MD;  Location: [facility];  Service: Orthopedics;  Laterality: Right;  . BACK SURGERY  04/2013  . back surgury     f  . BLEPHAROPLASTY Bilateral 02/23/15  . CATARACT EXTRACTION    . COLONOSCOPY    . COLONOSCOPY N/A 10/02/2017   Procedure: COLONOSCOPY;  Surgeon: Sherrilyn Rist, MD;  Location: [facility];  Service: Gastroenterology;  Laterality: N/A;  . COLONOSCOPY WITH PROPOFOL N/A 10/01/2017   Procedure: COLONOSCOPY WITH PROPOFOL;  Surgeon: Sherrilyn Rist, MD;  Location: [facility];  Service: Gastroenterology;  Laterality: N/A;  . COLONOSCOPY WITH PROPOFOL N/A 03/07/2018   Procedure: COLONOSCOPY WITH PROPOFOL;  Surgeon: Meryl Dare, MD;  Location: [facility];  Service: Endoscopy;  Laterality: N/A;  . ESOPHAGOGASTRODUODENOSCOPY (EGD) WITH PROPOFOL N/A 10/01/2017   Procedure: ESOPHAGOGASTRODUODENOSCOPY (EGD) WITH PROPOFOL;  Surgeon: Sherrilyn Rist, MD;  Location: [facility];  Service: Gastroenterology;  Laterality: N/A;  . EYE SURGERY Bilateral   . FINGER SURGERY Right    right thumb  . HARDWARE REMOVAL Right 05/15/2016   Procedure: Right Lumbar Two-Lumbar Five Removal of Hardware;  Surgeon: Barnett Abu, MD;  Location: [facility];  Service: Neurosurgery;  Laterality: Right;  Right L2-5 removal of hardware  . INGUINAL HERNIA REPAIR     x4  . IR RADIOLOGIST EVAL & MGMT  07/10/2018  . IR RADIOLOGIST EVAL & MGMT  09/05/2018  . IR SACROPLASTY BILATERAL  07/15/2018  . REPAIR SPIGELIAN HERNIA  2011  . SPINAL FUSION  1996 ,2014  . TONSILLECTOMY    . UPPER GASTROINTESTINAL ENDOSCOPY    . VASECTOMY      Allergies  Allergen Reactions  . Celebrex [Celecoxib] Other (See Comments)    Created stomach ulcers.  Lottie Dawson [Cyclobenzaprine] Other (See Comments)    Caught in hiatal hernia and caused extreme pain    Allergies as of 02/11/2021      Reactions    Celebrex [celecoxib] Other (See Comments)   Created stomach ulcers.   Flexeril [cyclobenzaprine] Other (See Comments)   Caught in hiatal hernia and caused extreme pain      Medication List       Accurate as of February 11, 2021 11:59 PM. If you have any questions, ask your nurse or doctor.        acetaminophen 500 MG tablet Commonly known as: TYLENOL Take 500 mg by mouth 3 (three) times daily.   alum & mag hydroxide-simeth 200-200-20 MG/5ML suspension Commonly known as: MAALOX/MYLANTA Take by mouth every 4 (four) hours as needed for indigestion or heartburn.   buPROPion 150 MG 12 hr tablet Commonly known as: WELLBUTRIN SR Take 150 mg by mouth 2 (two) times daily.   carboxymethylcellul-glycerin 0.5-0.9 % ophthalmic solution Commonly known as: REFRESH OPTIVE Place 1 drop into both eyes 3 (three) times daily.   cefTRIAXone 1 g injection Commonly known as: ROCEPHIN Inject 1 g into the muscle once.  2.1 ml of lidocaine   CENTRUM ADULTS PO Take 1 tablet by mouth daily.   PRESERVISION AREDS 2 PO Take 1 capsule by mouth 2 (two) times daily.   cholecalciferol 1000 units tablet Commonly known as: VITAMIN D Take 1,000 Units by mouth daily.   cyanocobalamin 1000 MCG tablet Take 1,000 mcg by mouth daily.   dextromethorphan-guaiFENesin 30-600 MG 12hr tablet Commonly known as: MUCINEX DM Take 1 tablet by mouth 2 (two) times daily as needed.   diclofenac Sodium 1 % Gel Commonly known as: VOLTAREN Apply 2 g topically 4 (four) times daily.   docusate sodium 100 MG capsule Commonly known as: COLACE Take 100 mg by mouth daily.   gabapentin 300 MG capsule Commonly known as: NEURONTIN Take 300 mg by mouth 2 (two) times daily. due to numbness and tingling   HYDROcodone-acetaminophen 5-325 MG tablet Commonly known as: NORCO/VICODIN Take one tablet by mouth every 6 hours as needed for moderate pain. Do not exceed 4gm of Tylenol in 24 hours.   lactase 3000 units tablet Commonly  known as: LACTAID Take 4,500 Units by mouth 3 (three) times daily with meals.   loratadine 10 MG tablet Commonly known as: CLARITIN Take 10 mg by mouth daily.   lovastatin 20 MG tablet Commonly known as: MEVACOR Take 20 mg by mouth at bedtime.   mirtazapine 7.5 MG tablet Commonly known as: REMERON Take 7.5 mg by mouth at bedtime.   pantoprazole 40 MG tablet Commonly known as: PROTONIX Take 40 mg by mouth every morning.   polyethylene glycol 17 g packet Commonly known as: MIRALAX / GLYCOLAX Take 17 g by mouth daily as needed.   propranolol 10 MG tablet Commonly known as: INDERAL Take 10 mg by mouth daily.   rOPINIRole 1 MG tablet Commonly known as: REQUIP TAKE 2 TABLETS(2 MG) BY MOUTH AT BEDTIME   saccharomyces boulardii 250 MG capsule Commonly known as: FLORASTOR Take 250 mg by mouth 2 (two) times daily.   zinc oxide 20 % ointment Apply 1 application topically as needed for irritation.       Review of Systems  Constitutional: Positive for activity change, appetite change and fatigue. Negative for fever.       Feel tired.   HENT: Positive for congestion and hearing loss. Negative for postnasal drip, sinus pain and voice change.   Eyes: Negative for visual disturbance.  Respiratory: Positive for cough and shortness of breath. Negative for wheezing.        Chronic non productive cough. DOE  Cardiovascular: Negative for chest pain and leg swelling.  Gastrointestinal: Negative for abdominal pain, constipation, nausea and vomiting.       Fecal incontinent.   Genitourinary: Positive for difficulty urinating. Negative for dysuria.       Foley  Musculoskeletal: Positive for arthralgias, back pain, gait problem and neck pain.       R 2nd 3rd hammer toes pain with walking, L 4th, 5th toes pain when touched, right lower back to the right leg pain when sitting in w/c for a long period of time.   Skin: Negative for color change.  Neurological: Positive for tremors. Negative  for speech difficulty, light-headedness and headaches.       Memory lapses. Essential tremor, peripheral neuropathy, RLS  Psychiatric/Behavioral: Negative for behavioral problems and sleep disturbance.    Immunization History  Administered Date(s) Administered  . Influenza, High Dose Seasonal PF 09/17/2019  . Influenza-Unspecified 09/16/2018, 09/07/2020  . Moderna Sars-Covid-2 Vaccination 12/08/2019, 01/05/2020  .  Pneumococcal Conjugate-13 12/29/2013  . Pneumococcal Polysaccharide-23 09/08/2005  . Tdap 05/25/2005, 08/08/2018  . Zoster Recombinat (Shingrix) 10/17/2017, 01/25/2018   Pertinent  Health Maintenance Due  Topic Date Due  . OPHTHALMOLOGY EXAM  Never done  . URINE MICROALBUMIN  Never done  . FOOT EXAM  09/28/2019  . INFLUENZA VACCINE  Completed  . PNA vac Low Risk Adult  Completed  . HEMOGLOBIN A1C  Discontinued   Fall Risk  01/01/2019 09/24/2018 01/04/2017 10/06/2016 09/29/2016  Falls in the past year? 0 No No No No   Functional Status Survey:    Vitals:   02/15/21 1512  BP: 107/64  Pulse: 60  Resp: 20  Temp: 97.8 F (36.6 C)  SpO2: 95%  Weight: 157 lb 9.6 oz (71.5 kg)  Height: _0  (1.676 m)   Body mass index is 25.44 kg/m. Physical Exam Vitals and nursing note reviewed.  Constitutional:      General: He is not in acute distress.    Appearance: He is ill-appearing.     Comments: Feel tired.   HENT:     Head: Normocephalic and atraumatic.     Nose: Nose normal. No congestion or rhinorrhea.     Mouth/Throat:     Mouth: Mucous membranes are dry.  Eyes:     Extraocular Movements: Extraocular movements intact.     Conjunctiva/sclera: Conjunctivae normal.     Pupils: Pupils are equal, round, and reactive to light.  Cardiovascular:     Rate and Rhythm: Normal rate and regular rhythm.     Heart sounds: Murmur heard.      Comments: Weak DP pulse on the right Pulmonary:     Effort: Pulmonary effort is normal.     Breath sounds: Rales present. No wheezing  or rhonchi.     Comments: Posterior left basilar rales.  Chest:     Chest wall: No tenderness.  Abdominal:     General: Bowel sounds are normal.     Palpations: Abdomen is soft.     Tenderness: There is no abdominal tenderness. There is no right CVA tenderness, left CVA tenderness, guarding or rebound.  Genitourinary:    Rectum: Normal.     Comments: Foley  Musculoskeletal:        General: Tenderness present.     Cervical back: Normal range of motion and neck supple.     Right lower leg: No edema.     Left lower leg: No edema.     Comments: S/p the right great and 4th toe amputation. R 2nd 3rd hammer toes pain with walking, L 4th, 5th toes pain when touched, right lower back to the right leg pain when sitting in w/c for a long period of time. Left foot flexion contracture.   Skin:    General: Skin is warm and dry.  Neurological:     General: No focal deficit present.     Mental Status: He is alert. Mental status is at baseline.     Gait: Gait abnormal.     Comments: Oriented to person, place.   Psychiatric:        Mood and Affect: Mood normal.        Behavior: Behavior normal.        Thought Content: Thought content normal.        Judgment: Judgment normal.     Labs reviewed: Recent Labs    12/30/20 0000 02/11/21 0000 02/14/21 0000  NA 139 137 139  K 4.3 4.3 4.2  CL  110* 106 107  CO2 20 23* 24*  BUN _0 CREATININE 0.7 0.9 0.8  CALCIUM 8.7 8.7 9.0   Recent Labs    12/30/20 0000 02/11/21 0000 02/14/21 0000  AST 30 29 39  ALT 10 9* 15  ALKPHOS 91 91 89  ALBUMIN 3.4* 3.6 3.4*   Recent Labs    12/30/20 0000 02/11/21 0000 02/14/21 0000  WBC 8.1 12.4 7.9  NEUTROABS 5,775.00 10,639.00 6,012.00  HGB 12.0* 12.7* 12.9*  HCT 36* 38* 40*  PLT 260 260 264   Lab Results  Component Value Date   TSH 2.68 12/10/2020   Lab Results  Component Value Date   HGBA1C 5.2 08/29/2018   Lab Results  Component Value Date   CHOL 158 08/29/2018   HDL 34 (A)  05/06/2020   LDLCALC 103 05/06/2020   TRIG 154 05/06/2020    Significant Diagnostic Results in last 30 days:  No results found.  Assessment/Plan Pneumonia involving left lung CXR 02/09/21 pulmonary infiltrate in the left lung base, wbc 12.4, neutrophils 85.8%. will extend Rocephin to total 7 days, update CBC/diff 02/14/21  Constipation Stable, continue MiraLax.   Weakness generalized No noted focal weakness, will update CMP/eGFR 02/14/21  Flexion contracture of joint of foot, left  Left foot flexion contracture, f/u podiatry, Ortho.  Urinary retention f/u Urology 06/10/20.  Depression, recurrent ([place]) on Wellbutrin 16m bid.TSH 2.68 12/09/20   Neuropathy Neuropathic pain in BLE,takesGabapentin 3073mbid, Tylenol 50086mid, Norco q6hr prn.c/o R 2nd 3rd hammer toes pain with walking, L 4th, 5th toes pain when touched, right lower back to the right leg pain when sitting in w/c for a long period of time.   GERD GERD, stable, on Pantoprazole 63m38m.   Essential tremor Essential tremor, stable, on Propranolol 10mg64mBun/creat 21/0.7 12/30/20   REM sleep behavior disorder Hx ofRLE/REM, stable, on Requip 2mg q13mleep study 10/25/20   OSA on CPAP OSA, saw Neurology 11/17/20, uses CPAP     Family/ staff Communication: plan of care reviewed with the patient and charge nurse.   Labs/tests ordered: CBC/diff, CMP/eGFR 02/14/21  Time spend 35 minutes.

## 2021-02-11 NOTE — Assessment & Plan Note (Signed)
on Wellbutrin 150mg  bid.TSH 2.68 12/09/20

## 2021-02-11 NOTE — Assessment & Plan Note (Signed)
f/u Urology 06/10/20.

## 2021-02-11 NOTE — Assessment & Plan Note (Signed)
OSA, saw Neurology 11/17/20, uses CPAP

## 2021-02-11 NOTE — Assessment & Plan Note (Signed)
Hx of RLE/REM, stable, on Requip 2mg qd. Sleep study 10/25/20 

## 2021-02-11 NOTE — Assessment & Plan Note (Signed)
No noted focal weakness, will update CMP/eGFR 02/14/21

## 2021-02-11 NOTE — Assessment & Plan Note (Signed)
Essential tremor, stable, on Propranolol 10mg  qd.Bun/creat 21/0.7 12/30/20

## 2021-02-11 NOTE — Assessment & Plan Note (Signed)
GERD, stable, on Pantoprazole 40mg  qd.

## 2021-02-11 NOTE — Assessment & Plan Note (Signed)
CXR 02/09/21 pulmonary infiltrate in the left lung base, wbc 12.4, neutrophils 85.8%. will extend Rocephin to total 7 days, update CBC/diff 02/14/21

## 2021-02-11 NOTE — Assessment & Plan Note (Signed)
Stable, continue MiraLax.  °

## 2021-02-11 NOTE — Assessment & Plan Note (Signed)
Neuropathic pain in BLE, takes Gabapentin 300mg bid,  Tylenol 500mg tid, Norco q6hr prn. c/o R 2nd 3rd hammer toes pain with walking, L 4th, 5th toes pain when touched, right lower back to the right leg pain when sitting in w/c for a long period of time.  

## 2021-02-11 NOTE — Assessment & Plan Note (Signed)
Left foot flexion contracture, f/u podiatry, Ortho. 

## 2021-02-14 DIAGNOSIS — R2689 Other abnormalities of gait and mobility: Secondary | ICD-10-CM | POA: Diagnosis not present

## 2021-02-14 DIAGNOSIS — M6281 Muscle weakness (generalized): Secondary | ICD-10-CM | POA: Diagnosis not present

## 2021-02-14 DIAGNOSIS — R531 Weakness: Secondary | ICD-10-CM | POA: Diagnosis not present

## 2021-02-14 DIAGNOSIS — M25551 Pain in right hip: Secondary | ICD-10-CM | POA: Diagnosis not present

## 2021-02-14 DIAGNOSIS — F0390 Unspecified dementia without behavioral disturbance: Secondary | ICD-10-CM | POA: Diagnosis not present

## 2021-02-14 DIAGNOSIS — D649 Anemia, unspecified: Secondary | ICD-10-CM | POA: Diagnosis not present

## 2021-02-14 DIAGNOSIS — R52 Pain, unspecified: Secondary | ICD-10-CM | POA: Diagnosis not present

## 2021-02-14 DIAGNOSIS — R293 Abnormal posture: Secondary | ICD-10-CM | POA: Diagnosis not present

## 2021-02-14 LAB — BASIC METABOLIC PANEL
BUN: 16 (ref 4–21)
CO2: 24 — AB (ref 13–22)
Chloride: 107 (ref 99–108)
Creatinine: 0.8 (ref 0.6–1.3)
Glucose: 74
Potassium: 4.2 (ref 3.4–5.3)
Sodium: 139 (ref 137–147)

## 2021-02-14 LAB — CBC AND DIFFERENTIAL
HCT: 40 — AB (ref 41–53)
Hemoglobin: 12.9 — AB (ref 13.5–17.5)
Neutrophils Absolute: 6012
Platelets: 264 (ref 150–399)
WBC: 7.9

## 2021-02-14 LAB — COMPREHENSIVE METABOLIC PANEL
Albumin: 3.4 — AB (ref 3.5–5.0)
Calcium: 9 (ref 8.7–10.7)
Globulin: 3.5

## 2021-02-14 LAB — HEPATIC FUNCTION PANEL
ALT: 15 (ref 10–40)
AST: 39 (ref 14–40)
Alkaline Phosphatase: 89 (ref 25–125)
Bilirubin, Total: 0.4

## 2021-02-14 LAB — CBC: RBC: 4.44 (ref 3.87–5.11)

## 2021-02-15 ENCOUNTER — Encounter: Payer: Self-pay | Admitting: Nurse Practitioner

## 2021-02-15 DIAGNOSIS — M25551 Pain in right hip: Secondary | ICD-10-CM | POA: Diagnosis not present

## 2021-02-15 DIAGNOSIS — R2689 Other abnormalities of gait and mobility: Secondary | ICD-10-CM | POA: Diagnosis not present

## 2021-02-15 DIAGNOSIS — R52 Pain, unspecified: Secondary | ICD-10-CM | POA: Diagnosis not present

## 2021-02-15 DIAGNOSIS — R531 Weakness: Secondary | ICD-10-CM | POA: Diagnosis not present

## 2021-02-15 DIAGNOSIS — R293 Abnormal posture: Secondary | ICD-10-CM | POA: Diagnosis not present

## 2021-02-15 DIAGNOSIS — M6281 Muscle weakness (generalized): Secondary | ICD-10-CM | POA: Diagnosis not present

## 2021-02-16 ENCOUNTER — Encounter: Payer: Self-pay | Admitting: Nurse Practitioner

## 2021-02-16 DIAGNOSIS — M25551 Pain in right hip: Secondary | ICD-10-CM | POA: Diagnosis not present

## 2021-02-16 DIAGNOSIS — R52 Pain, unspecified: Secondary | ICD-10-CM | POA: Diagnosis not present

## 2021-02-16 DIAGNOSIS — M6281 Muscle weakness (generalized): Secondary | ICD-10-CM | POA: Diagnosis not present

## 2021-02-16 DIAGNOSIS — R531 Weakness: Secondary | ICD-10-CM | POA: Diagnosis not present

## 2021-02-16 DIAGNOSIS — R2689 Other abnormalities of gait and mobility: Secondary | ICD-10-CM | POA: Diagnosis not present

## 2021-02-16 DIAGNOSIS — R293 Abnormal posture: Secondary | ICD-10-CM | POA: Diagnosis not present

## 2021-02-17 DIAGNOSIS — M6281 Muscle weakness (generalized): Secondary | ICD-10-CM | POA: Diagnosis not present

## 2021-02-17 DIAGNOSIS — M25551 Pain in right hip: Secondary | ICD-10-CM | POA: Diagnosis not present

## 2021-02-17 DIAGNOSIS — R2689 Other abnormalities of gait and mobility: Secondary | ICD-10-CM | POA: Diagnosis not present

## 2021-02-17 DIAGNOSIS — R52 Pain, unspecified: Secondary | ICD-10-CM | POA: Diagnosis not present

## 2021-02-17 DIAGNOSIS — R531 Weakness: Secondary | ICD-10-CM | POA: Diagnosis not present

## 2021-02-17 DIAGNOSIS — R293 Abnormal posture: Secondary | ICD-10-CM | POA: Diagnosis not present

## 2021-02-18 DIAGNOSIS — R293 Abnormal posture: Secondary | ICD-10-CM | POA: Diagnosis not present

## 2021-02-18 DIAGNOSIS — R52 Pain, unspecified: Secondary | ICD-10-CM | POA: Diagnosis not present

## 2021-02-18 DIAGNOSIS — M6281 Muscle weakness (generalized): Secondary | ICD-10-CM | POA: Diagnosis not present

## 2021-02-18 DIAGNOSIS — R2689 Other abnormalities of gait and mobility: Secondary | ICD-10-CM | POA: Diagnosis not present

## 2021-02-18 DIAGNOSIS — R531 Weakness: Secondary | ICD-10-CM | POA: Diagnosis not present

## 2021-02-18 DIAGNOSIS — M25551 Pain in right hip: Secondary | ICD-10-CM | POA: Diagnosis not present

## 2021-02-21 DIAGNOSIS — R52 Pain, unspecified: Secondary | ICD-10-CM | POA: Diagnosis not present

## 2021-02-21 DIAGNOSIS — R2689 Other abnormalities of gait and mobility: Secondary | ICD-10-CM | POA: Diagnosis not present

## 2021-02-21 DIAGNOSIS — M25551 Pain in right hip: Secondary | ICD-10-CM | POA: Diagnosis not present

## 2021-02-21 DIAGNOSIS — R531 Weakness: Secondary | ICD-10-CM | POA: Diagnosis not present

## 2021-02-21 DIAGNOSIS — M6281 Muscle weakness (generalized): Secondary | ICD-10-CM | POA: Diagnosis not present

## 2021-02-21 DIAGNOSIS — R293 Abnormal posture: Secondary | ICD-10-CM | POA: Diagnosis not present

## 2021-02-22 DIAGNOSIS — R52 Pain, unspecified: Secondary | ICD-10-CM | POA: Diagnosis not present

## 2021-02-22 DIAGNOSIS — M25551 Pain in right hip: Secondary | ICD-10-CM | POA: Diagnosis not present

## 2021-02-22 DIAGNOSIS — R293 Abnormal posture: Secondary | ICD-10-CM | POA: Diagnosis not present

## 2021-02-22 DIAGNOSIS — R531 Weakness: Secondary | ICD-10-CM | POA: Diagnosis not present

## 2021-02-22 DIAGNOSIS — R2689 Other abnormalities of gait and mobility: Secondary | ICD-10-CM | POA: Diagnosis not present

## 2021-02-22 DIAGNOSIS — M6281 Muscle weakness (generalized): Secondary | ICD-10-CM | POA: Diagnosis not present

## 2021-02-24 DIAGNOSIS — M6281 Muscle weakness (generalized): Secondary | ICD-10-CM | POA: Diagnosis not present

## 2021-02-24 DIAGNOSIS — R2689 Other abnormalities of gait and mobility: Secondary | ICD-10-CM | POA: Diagnosis not present

## 2021-02-24 DIAGNOSIS — M25551 Pain in right hip: Secondary | ICD-10-CM | POA: Diagnosis not present

## 2021-02-24 DIAGNOSIS — R52 Pain, unspecified: Secondary | ICD-10-CM | POA: Diagnosis not present

## 2021-02-24 DIAGNOSIS — R293 Abnormal posture: Secondary | ICD-10-CM | POA: Diagnosis not present

## 2021-02-24 DIAGNOSIS — R531 Weakness: Secondary | ICD-10-CM | POA: Diagnosis not present

## 2021-02-25 DIAGNOSIS — R52 Pain, unspecified: Secondary | ICD-10-CM | POA: Diagnosis not present

## 2021-02-25 DIAGNOSIS — M25551 Pain in right hip: Secondary | ICD-10-CM | POA: Diagnosis not present

## 2021-02-25 DIAGNOSIS — R2689 Other abnormalities of gait and mobility: Secondary | ICD-10-CM | POA: Diagnosis not present

## 2021-02-25 DIAGNOSIS — R531 Weakness: Secondary | ICD-10-CM | POA: Diagnosis not present

## 2021-02-25 DIAGNOSIS — M6281 Muscle weakness (generalized): Secondary | ICD-10-CM | POA: Diagnosis not present

## 2021-02-25 DIAGNOSIS — R293 Abnormal posture: Secondary | ICD-10-CM | POA: Diagnosis not present

## 2021-02-28 DIAGNOSIS — M6281 Muscle weakness (generalized): Secondary | ICD-10-CM | POA: Diagnosis not present

## 2021-02-28 DIAGNOSIS — M25551 Pain in right hip: Secondary | ICD-10-CM | POA: Diagnosis not present

## 2021-02-28 DIAGNOSIS — R52 Pain, unspecified: Secondary | ICD-10-CM | POA: Diagnosis not present

## 2021-02-28 DIAGNOSIS — R531 Weakness: Secondary | ICD-10-CM | POA: Diagnosis not present

## 2021-02-28 DIAGNOSIS — R2689 Other abnormalities of gait and mobility: Secondary | ICD-10-CM | POA: Diagnosis not present

## 2021-02-28 DIAGNOSIS — D510 Vitamin B12 deficiency anemia due to intrinsic factor deficiency: Secondary | ICD-10-CM | POA: Diagnosis not present

## 2021-02-28 DIAGNOSIS — R293 Abnormal posture: Secondary | ICD-10-CM | POA: Diagnosis not present

## 2021-03-01 DIAGNOSIS — R293 Abnormal posture: Secondary | ICD-10-CM | POA: Diagnosis not present

## 2021-03-01 DIAGNOSIS — M6281 Muscle weakness (generalized): Secondary | ICD-10-CM | POA: Diagnosis not present

## 2021-03-01 DIAGNOSIS — R2689 Other abnormalities of gait and mobility: Secondary | ICD-10-CM | POA: Diagnosis not present

## 2021-03-01 DIAGNOSIS — H353132 Nonexudative age-related macular degeneration, bilateral, intermediate dry stage: Secondary | ICD-10-CM | POA: Diagnosis not present

## 2021-03-01 DIAGNOSIS — M25551 Pain in right hip: Secondary | ICD-10-CM | POA: Diagnosis not present

## 2021-03-01 DIAGNOSIS — R52 Pain, unspecified: Secondary | ICD-10-CM | POA: Diagnosis not present

## 2021-03-01 DIAGNOSIS — H35373 Puckering of macula, bilateral: Secondary | ICD-10-CM | POA: Diagnosis not present

## 2021-03-01 DIAGNOSIS — H43813 Vitreous degeneration, bilateral: Secondary | ICD-10-CM | POA: Diagnosis not present

## 2021-03-01 DIAGNOSIS — H442A2 Degenerative myopia with choroidal neovascularization, left eye: Secondary | ICD-10-CM | POA: Diagnosis not present

## 2021-03-01 DIAGNOSIS — R531 Weakness: Secondary | ICD-10-CM | POA: Diagnosis not present

## 2021-03-01 LAB — VITAMIN B12: Vitamin B-12: 683

## 2021-03-02 DIAGNOSIS — R52 Pain, unspecified: Secondary | ICD-10-CM | POA: Diagnosis not present

## 2021-03-02 DIAGNOSIS — R293 Abnormal posture: Secondary | ICD-10-CM | POA: Diagnosis not present

## 2021-03-02 DIAGNOSIS — R531 Weakness: Secondary | ICD-10-CM | POA: Diagnosis not present

## 2021-03-02 DIAGNOSIS — M6281 Muscle weakness (generalized): Secondary | ICD-10-CM | POA: Diagnosis not present

## 2021-03-02 DIAGNOSIS — R2689 Other abnormalities of gait and mobility: Secondary | ICD-10-CM | POA: Diagnosis not present

## 2021-03-02 DIAGNOSIS — M25551 Pain in right hip: Secondary | ICD-10-CM | POA: Diagnosis not present

## 2021-03-03 DIAGNOSIS — R531 Weakness: Secondary | ICD-10-CM | POA: Diagnosis not present

## 2021-03-03 DIAGNOSIS — R293 Abnormal posture: Secondary | ICD-10-CM | POA: Diagnosis not present

## 2021-03-03 DIAGNOSIS — M6281 Muscle weakness (generalized): Secondary | ICD-10-CM | POA: Diagnosis not present

## 2021-03-03 DIAGNOSIS — R52 Pain, unspecified: Secondary | ICD-10-CM | POA: Diagnosis not present

## 2021-03-03 DIAGNOSIS — M25551 Pain in right hip: Secondary | ICD-10-CM | POA: Diagnosis not present

## 2021-03-03 DIAGNOSIS — R2689 Other abnormalities of gait and mobility: Secondary | ICD-10-CM | POA: Diagnosis not present

## 2021-03-04 DIAGNOSIS — R2689 Other abnormalities of gait and mobility: Secondary | ICD-10-CM | POA: Diagnosis not present

## 2021-03-04 DIAGNOSIS — R52 Pain, unspecified: Secondary | ICD-10-CM | POA: Diagnosis not present

## 2021-03-04 DIAGNOSIS — R293 Abnormal posture: Secondary | ICD-10-CM | POA: Diagnosis not present

## 2021-03-04 DIAGNOSIS — M25551 Pain in right hip: Secondary | ICD-10-CM | POA: Diagnosis not present

## 2021-03-04 DIAGNOSIS — M6281 Muscle weakness (generalized): Secondary | ICD-10-CM | POA: Diagnosis not present

## 2021-03-04 DIAGNOSIS — R531 Weakness: Secondary | ICD-10-CM | POA: Diagnosis not present

## 2021-03-04 DIAGNOSIS — R2681 Unsteadiness on feet: Secondary | ICD-10-CM | POA: Diagnosis not present

## 2021-03-07 DIAGNOSIS — R52 Pain, unspecified: Secondary | ICD-10-CM | POA: Diagnosis not present

## 2021-03-07 DIAGNOSIS — R2689 Other abnormalities of gait and mobility: Secondary | ICD-10-CM | POA: Diagnosis not present

## 2021-03-07 DIAGNOSIS — M6281 Muscle weakness (generalized): Secondary | ICD-10-CM | POA: Diagnosis not present

## 2021-03-07 DIAGNOSIS — R2681 Unsteadiness on feet: Secondary | ICD-10-CM | POA: Diagnosis not present

## 2021-03-07 DIAGNOSIS — M25551 Pain in right hip: Secondary | ICD-10-CM | POA: Diagnosis not present

## 2021-03-07 DIAGNOSIS — R293 Abnormal posture: Secondary | ICD-10-CM | POA: Diagnosis not present

## 2021-03-08 DIAGNOSIS — M6281 Muscle weakness (generalized): Secondary | ICD-10-CM | POA: Diagnosis not present

## 2021-03-08 DIAGNOSIS — R2681 Unsteadiness on feet: Secondary | ICD-10-CM | POA: Diagnosis not present

## 2021-03-08 DIAGNOSIS — R52 Pain, unspecified: Secondary | ICD-10-CM | POA: Diagnosis not present

## 2021-03-08 DIAGNOSIS — R2689 Other abnormalities of gait and mobility: Secondary | ICD-10-CM | POA: Diagnosis not present

## 2021-03-08 DIAGNOSIS — R293 Abnormal posture: Secondary | ICD-10-CM | POA: Diagnosis not present

## 2021-03-08 DIAGNOSIS — M25551 Pain in right hip: Secondary | ICD-10-CM | POA: Diagnosis not present

## 2021-03-10 DIAGNOSIS — R2689 Other abnormalities of gait and mobility: Secondary | ICD-10-CM | POA: Diagnosis not present

## 2021-03-10 DIAGNOSIS — M25551 Pain in right hip: Secondary | ICD-10-CM | POA: Diagnosis not present

## 2021-03-10 DIAGNOSIS — M6281 Muscle weakness (generalized): Secondary | ICD-10-CM | POA: Diagnosis not present

## 2021-03-10 DIAGNOSIS — R293 Abnormal posture: Secondary | ICD-10-CM | POA: Diagnosis not present

## 2021-03-10 DIAGNOSIS — R52 Pain, unspecified: Secondary | ICD-10-CM | POA: Diagnosis not present

## 2021-03-10 DIAGNOSIS — R2681 Unsteadiness on feet: Secondary | ICD-10-CM | POA: Diagnosis not present

## 2021-03-11 DIAGNOSIS — R2681 Unsteadiness on feet: Secondary | ICD-10-CM | POA: Diagnosis not present

## 2021-03-11 DIAGNOSIS — R293 Abnormal posture: Secondary | ICD-10-CM | POA: Diagnosis not present

## 2021-03-11 DIAGNOSIS — M25551 Pain in right hip: Secondary | ICD-10-CM | POA: Diagnosis not present

## 2021-03-11 DIAGNOSIS — R2689 Other abnormalities of gait and mobility: Secondary | ICD-10-CM | POA: Diagnosis not present

## 2021-03-11 DIAGNOSIS — R52 Pain, unspecified: Secondary | ICD-10-CM | POA: Diagnosis not present

## 2021-03-11 DIAGNOSIS — M6281 Muscle weakness (generalized): Secondary | ICD-10-CM | POA: Diagnosis not present

## 2021-03-13 DIAGNOSIS — R2689 Other abnormalities of gait and mobility: Secondary | ICD-10-CM | POA: Diagnosis not present

## 2021-03-13 DIAGNOSIS — M6281 Muscle weakness (generalized): Secondary | ICD-10-CM | POA: Diagnosis not present

## 2021-03-13 DIAGNOSIS — M25551 Pain in right hip: Secondary | ICD-10-CM | POA: Diagnosis not present

## 2021-03-13 DIAGNOSIS — R293 Abnormal posture: Secondary | ICD-10-CM | POA: Diagnosis not present

## 2021-03-13 DIAGNOSIS — R52 Pain, unspecified: Secondary | ICD-10-CM | POA: Diagnosis not present

## 2021-03-13 DIAGNOSIS — R2681 Unsteadiness on feet: Secondary | ICD-10-CM | POA: Diagnosis not present

## 2021-03-14 DIAGNOSIS — M6281 Muscle weakness (generalized): Secondary | ICD-10-CM | POA: Diagnosis not present

## 2021-03-14 DIAGNOSIS — M25551 Pain in right hip: Secondary | ICD-10-CM | POA: Diagnosis not present

## 2021-03-14 DIAGNOSIS — R2681 Unsteadiness on feet: Secondary | ICD-10-CM | POA: Diagnosis not present

## 2021-03-14 DIAGNOSIS — R293 Abnormal posture: Secondary | ICD-10-CM | POA: Diagnosis not present

## 2021-03-14 DIAGNOSIS — R52 Pain, unspecified: Secondary | ICD-10-CM | POA: Diagnosis not present

## 2021-03-14 DIAGNOSIS — R2689 Other abnormalities of gait and mobility: Secondary | ICD-10-CM | POA: Diagnosis not present

## 2021-03-15 DIAGNOSIS — M25551 Pain in right hip: Secondary | ICD-10-CM | POA: Diagnosis not present

## 2021-03-15 DIAGNOSIS — R293 Abnormal posture: Secondary | ICD-10-CM | POA: Diagnosis not present

## 2021-03-15 DIAGNOSIS — R2681 Unsteadiness on feet: Secondary | ICD-10-CM | POA: Diagnosis not present

## 2021-03-15 DIAGNOSIS — R52 Pain, unspecified: Secondary | ICD-10-CM | POA: Diagnosis not present

## 2021-03-15 DIAGNOSIS — M6281 Muscle weakness (generalized): Secondary | ICD-10-CM | POA: Diagnosis not present

## 2021-03-15 DIAGNOSIS — R2689 Other abnormalities of gait and mobility: Secondary | ICD-10-CM | POA: Diagnosis not present

## 2021-03-16 DIAGNOSIS — R2681 Unsteadiness on feet: Secondary | ICD-10-CM | POA: Diagnosis not present

## 2021-03-16 DIAGNOSIS — M25551 Pain in right hip: Secondary | ICD-10-CM | POA: Diagnosis not present

## 2021-03-16 DIAGNOSIS — R52 Pain, unspecified: Secondary | ICD-10-CM | POA: Diagnosis not present

## 2021-03-16 DIAGNOSIS — R293 Abnormal posture: Secondary | ICD-10-CM | POA: Diagnosis not present

## 2021-03-16 DIAGNOSIS — R2689 Other abnormalities of gait and mobility: Secondary | ICD-10-CM | POA: Diagnosis not present

## 2021-03-16 DIAGNOSIS — M6281 Muscle weakness (generalized): Secondary | ICD-10-CM | POA: Diagnosis not present

## 2021-03-17 ENCOUNTER — Encounter: Payer: Self-pay | Admitting: Internal Medicine

## 2021-03-17 ENCOUNTER — Non-Acute Institutional Stay (SKILLED_NURSING_FACILITY): Payer: Medicare Other | Admitting: Internal Medicine

## 2021-03-17 DIAGNOSIS — G629 Polyneuropathy, unspecified: Secondary | ICD-10-CM

## 2021-03-17 DIAGNOSIS — R339 Retention of urine, unspecified: Secondary | ICD-10-CM

## 2021-03-17 DIAGNOSIS — K219 Gastro-esophageal reflux disease without esophagitis: Secondary | ICD-10-CM

## 2021-03-17 DIAGNOSIS — G4733 Obstructive sleep apnea (adult) (pediatric): Secondary | ICD-10-CM | POA: Diagnosis not present

## 2021-03-17 DIAGNOSIS — R531 Weakness: Secondary | ICD-10-CM | POA: Diagnosis not present

## 2021-03-17 DIAGNOSIS — F339 Major depressive disorder, recurrent, unspecified: Secondary | ICD-10-CM | POA: Diagnosis not present

## 2021-03-17 DIAGNOSIS — K5900 Constipation, unspecified: Secondary | ICD-10-CM

## 2021-03-17 DIAGNOSIS — Z9989 Dependence on other enabling machines and devices: Secondary | ICD-10-CM

## 2021-03-17 NOTE — Progress Notes (Signed)
Location:    [facility] Room Number: 26 Place of Service:  SNF [phone]) Provider:  Veleta Miners MD  [name], MD  Patient Care Team: [name], MD as PCP - General (Internal Medicine) [name], MD (Hematology and Oncology) [name], MD as Attending Physician (Urology) [name], MD (Gastroenterology)  Extended Emergency Contact Information Primary Emergency Contact: Marquan, Vokes Mobile Phone: [phone] Relation: Son Preferred language: English Interpreter needed? No Secondary Emergency Contact: Camero,Janet H Address: [address]          [address]          [place], [place] [address] [name] of [place] Phone: [phone] Mobile Phone: [phone] Relation: Spouse  Code Status:  DNR Goals of care: Advanced Directive information Advanced Directives 03/17/2021  Does Patient Have a Medical Advance Directive? Yes  Type of Advance Directive Out of facility DNR (pink MOST or yellow form);Living will;Healthcare Power of Attorney  Does patient want to make changes to medical advance directive? No - Patient declined  Copy of [place] in Chart? Yes - validated most recent copy scanned in chart (See row information)  Would patient like information on creating a medical advance directive? -  Pre-existing out of facility DNR order (yellow form or pink MOST form) Pink MOST form placed in chart (order not valid for inpatient use);Yellow form placed in chart (order not valid for inpatient use)     Chief Complaint  Patient presents with  . Medical Management of Chronic Issues  . Health Maintenance    Foot and eye exam, urine microalbumin    HPI:  Pt is a 87 y.o. male seen today for medical management of chronic diseases.    Patient has number of medical problems including Chronic Diarrhea, H/O AVM, Barretts Esphagus, OSA on CPAP, Hypertension, h/o Prostate cancer wih Chronic Foley  Catheter, Chronic Pain due to Kyphosis andNeuropathyand C Diff colitis S/P Right Great Toe Amputation in 3/20 for Osteomyelitis  Patient active issue  Per Nurses and his family he is more depressed Tries to stay in his room.Not very interactive Weight is stable But general Physical decline since he was diagnosed with Pneumonia  Also c/o Constipation. Xray done few weeks ago showed Moderate Stool NO abdominal pain or Nausea  Past Medical History:  Diagnosis Date  . Anemia   . Arthritis   . AVM (arteriovenous malformation)   . Barrett's esophagus   . BPH (benign prostatic hyperplasia)   . Cataract   . Constipation   . CPAP (continuous positive airway pressure) dependence   . Depression   . Diverticulosis    diverticular bleeding  . Diverticulosis   . GERD (gastroesophageal reflux disease)   . History of radiation therapy   . HOH (hard of hearing)    wears hearing aids  . Hyperlipidemia   . Hypoglycemia   . Lymphocytosis 06/2012  . Mastodynia   . Murmur    slight  . Nasal congestion   . Osteoporosis   . Osteoporosis   . Prostate cancer ([place])   . Sleep apnea    wears CPAP  . Urinary retention with incomplete bladder emptying    pt uses bathroom and within 30 mins needs to go again  . Vitamin D deficiency    Past Surgical History:  Procedure Laterality Date  . AMPUTATION TOE Right 09/20/2017   Procedure: Right 4th toe amputation;  Surgeon: Wylene Simmer, MD;  Location: [facility];  Service: Orthopedics;  Laterality: Right;  foot block  . AMPUTATION TOE Right 02/05/2019   Procedure: RIGHT GREAT TOE AMPUTATION;  Surgeon: Leandrew Koyanagi, MD;  Location: [facility];  Service: Orthopedics;  Laterality: Right;  . BACK SURGERY  04/2013  . back surgury     f  . BLEPHAROPLASTY Bilateral 02/23/15  . CATARACT EXTRACTION    . COLONOSCOPY    . COLONOSCOPY N/A 10/02/2017   Procedure: COLONOSCOPY;  Surgeon: Doran Stabler, MD;  Location: [facility];  Service: Gastroenterology;  Laterality: N/A;  . COLONOSCOPY WITH PROPOFOL N/A 10/01/2017   Procedure: COLONOSCOPY WITH PROPOFOL;  Surgeon: Doran Stabler, MD;  Location: [facility];  Service: Gastroenterology;  Laterality: N/A;  . COLONOSCOPY WITH PROPOFOL N/A 03/07/2018   Procedure: COLONOSCOPY WITH PROPOFOL;  Surgeon: [name], MD;  Location: [facility];  Service: Endoscopy;  Laterality: N/A;  . ESOPHAGOGASTRODUODENOSCOPY (EGD) WITH PROPOFOL N/A 10/01/2017   Procedure: ESOPHAGOGASTRODUODENOSCOPY (EGD) WITH PROPOFOL;  Surgeon: Doran Stabler, MD;  Location[facility];  Service: Gastroenterology;  Laterality: N/A;  . EYE SURGERY Bilateral   . FINGER SURGERY Right    right thumb  . HARDWARE REMOVAL Right 05/15/2016   Procedure: Right Lumbar Two-Lumbar Five Removal of Hardware;  Surgeon: Kristeen Miss, MD;  Location: [facility];  Service: Neurosurgery;  Laterality: Right;  Right L2-5 removal of hardware  . INGUINAL HERNIA REPAIR     x4  . IR RADIOLOGIST EVAL & MGMT  07/10/2018  . IR RADIOLOGIST EVAL & MGMT  09/05/2018  . IR SACROPLASTY BILATERAL  07/15/2018  . REPAIR SPIGELIAN HERNIA  2011  . [place] ,2014  . TONSILLECTOMY    . UPPER GASTROINTESTINAL ENDOSCOPY    . VASECTOMY      Allergies  Allergen Reactions  . Celebrex [Celecoxib] Other (See Comments)    Created stomach ulcers.  Yvette Rack [Cyclobenzaprine] Other (See Comments)    Caught in hiatal hernia and caused extreme pain    Allergies as of 03/17/2021      Reactions   Celebrex [celecoxib] Other (See Comments)   Created stomach ulcers.   Flexeril [cyclobenzaprine] Other (See Comments)   Caught in hiatal hernia and caused extreme pain      Medication List       Accurate as of March 17, 2021  9:33 AM. If you have any questions, ask your nurse or doctor.        acetaminophen 500 MG tablet Commonly known as: TYLENOL Take 500 mg by mouth 3 (three) times daily.   buPROPion 150  MG 12 hr tablet Commonly known as: WELLBUTRIN SR Take 150 mg by mouth 2 (two) times daily.   carboxymethylcellul-glycerin 0.5-0.9 % ophthalmic solution Commonly known as: REFRESH OPTIVE Place 1 drop into both eyes 3 (three) times daily.   CENTRUM ADULTS PO Take 1 tablet by mouth daily.   PRESERVISION AREDS 2 PO Take 1 capsule by mouth 2 (two) times daily.   cholecalciferol 1000 units tablet Commonly known as: VITAMIN D Take 1,000 Units by mouth daily.   cyanocobalamin 1000 MCG tablet Take 1,000 mcg by mouth daily.   dextromethorphan-guaiFENesin 30-600 MG 12hr tablet Commonly known as: MUCINEX DM Take 1 tablet by mouth 2 (two) times daily as needed.   diclofenac Sodium 1 % Gel Commonly known as: VOLTAREN Apply 2 g topically 4 (four) times daily.   docusate sodium 100 MG capsule Commonly known as: COLACE  Take 100 mg by mouth daily.   gabapentin 300 MG capsule Commonly known as: NEURONTIN Take 300 mg by mouth 2 (two) times daily. due to numbness and tingling   HYDROcodone-acetaminophen 5-325 MG tablet Commonly known as: NORCO/VICODIN Take one tablet by mouth every 6 hours as needed for moderate pain. Do not exceed 4gm of Tylenol in 24 hours.   lactase 3000 units tablet Commonly known as: LACTAID Take 4,500 Units by mouth 3 (three) times daily with meals.   loratadine 10 MG tablet Commonly known as: CLARITIN Take 10 mg by mouth daily.   lovastatin 20 MG tablet Commonly known as: MEVACOR Take 20 mg by mouth at bedtime.   mirtazapine 7.5 MG tablet Commonly known as: REMERON Take 7.5 mg by mouth at bedtime.   pantoprazole 40 MG tablet Commonly known as: PROTONIX Take 40 mg by mouth every morning.   polyethylene glycol 17 g packet Commonly known as: MIRALAX / GLYCOLAX Take 17 g by mouth daily as needed.   propranolol 10 MG tablet Commonly known as: INDERAL Take 10 mg by mouth daily.   rOPINIRole 1 MG tablet Commonly known as: REQUIP TAKE 2 TABLETS(2 MG)  BY MOUTH AT BEDTIME   zinc oxide 20 % ointment Apply 1 application topically as needed for irritation.       Review of Systems  Constitutional: Positive for activity change.  HENT: Negative.   Respiratory: Negative.   Cardiovascular: Negative.   Gastrointestinal: Positive for constipation.  Genitourinary: Negative.   Musculoskeletal: Positive for gait problem.  Skin: Negative.   Neurological: Positive for weakness.  Psychiatric/Behavioral: Positive for dysphoric mood.    Immunization History  Administered Date(s) Administered  . Influenza, High Dose Seasonal PF 09/17/2019  . Influenza-Unspecified 09/16/2018, 09/07/2020  . Moderna SARS-COV2 Booster Vaccination 10/18/2020  . Moderna Sars-Covid-2 Vaccination 12/08/2019, 01/05/2020  . Pneumococcal Conjugate-13 12/29/2013  . Pneumococcal Polysaccharide-23 09/08/2005  . Tdap 05/25/2005, 08/08/2018  . Zoster Recombinat (Shingrix) 10/17/2017, 01/25/2018   Pertinent  Health Maintenance Due  Topic Date Due  . OPHTHALMOLOGY EXAM  Never done  . URINE MICROALBUMIN  Never done  . FOOT EXAM  09/28/2019  . INFLUENZA VACCINE  07/04/2021  . PNA vac Low Risk Adult  Completed  . HEMOGLOBIN A1C  Discontinued   Fall Risk  01/01/2019 09/24/2018 01/04/2017 10/06/2016 09/29/2016  Falls in the past year? 0 No No No No   Functional Status Survey:    Vitals:   03/17/21 0851  BP: 106/67  Pulse: (!) 58  Resp: 17  Temp: (!) 97.3 F (36.3 C)  SpO2: 94%  Weight: 157 lb 9.6 oz (71.5 kg)  Height: 5\' 6"  (1.676 m)   Body mass index is 25.44 kg/m. Physical Exam Constitutional: Oriented to person, place, and time. Well-developed and well-nourished.  HENT:  Head: Normocephalic.  Mouth/Throat: Oropharynx is clear and moist.  Eyes: Pupils are equal, round, and reactive to light.  Neck: Neck supple.  Cardiovascular: Normal rate and normal heart sounds.  No murmur heard. Pulmonary/Chest: Effort normal and breath sounds normal. No respiratory  distress. No wheezes. She has no rales.  Abdominal: Soft. Bowel sounds are normal. No distension. There is no tenderness. There is no rebound.  Musculoskeletal: No edema.  Lymphadenopathy: none Neurological: Alert and oriented to person, place, and time.  Skin: Skin is warm and dry.  Psychiatric: Normal mood and affect. Behavior is normal. Thought content normal.  Labs reviewed: Recent Labs    12/30/20 0000 02/11/21 0000 02/14/21 0000  NA 139  137 139  K 4.3 4.3 4.2  CL 110* 106 107  CO2 20 23* 24*  BUN 21 21 16   CREATININE 0.7 0.9 0.8  CALCIUM 8.7 8.7 9.0   Recent Labs    12/30/20 0000 02/11/21 0000 02/14/21 0000  AST 30 29 39  ALT 10 9* 15  ALKPHOS 91 91 89  ALBUMIN 3.4* 3.6 3.4*   Recent Labs    12/30/20 0000 02/11/21 0000 02/14/21 0000  WBC 8.1 12.4 7.9  NEUTROABS 5,775.00 10,639.00 6,012.00  HGB 12.0* 12.7* 12.9*  HCT 36* 38* 40*  PLT 260 260 264   Lab Results  Component Value Date   TSH 2.68 12/10/2020   Lab Results  Component Value Date   HGBA1C 5.2 08/29/2018   Lab Results  Component Value Date   CHOL 158 08/29/2018   HDL 34 (A) 05/06/2020   LDLCALC 103 05/06/2020   TRIG 154 05/06/2020    Significant Diagnostic Results in last 30 days:  No results found.  Assessment/Plan Depression, recurrent (HCC) Change Wellbutrin to 200 mg BID Also on Remeron Weight stable  Neuropathy On High Doses of Neurontin Weakness generalized Recovering from Pneumonia Labs in good limit  Supportive care Constipation, unspecified constipation type Will start on Senna Also already  on Miralx Gastroesophageal reflux disease without esophagitis On Protonix OSA on CPAP  Urinary retention Chronic foley cathter  Chronic bilateral low back pain with bilateral sciatica On NOcro Tremor, essential On Low dose of Propranol Hyperlipidemia On Mevacor Restless  On Requip  Family/ staff Communication:   Labs/tests ordered:

## 2021-03-21 DIAGNOSIS — R293 Abnormal posture: Secondary | ICD-10-CM | POA: Diagnosis not present

## 2021-03-21 DIAGNOSIS — R52 Pain, unspecified: Secondary | ICD-10-CM | POA: Diagnosis not present

## 2021-03-21 DIAGNOSIS — R2681 Unsteadiness on feet: Secondary | ICD-10-CM | POA: Diagnosis not present

## 2021-03-21 DIAGNOSIS — M6281 Muscle weakness (generalized): Secondary | ICD-10-CM | POA: Diagnosis not present

## 2021-03-21 DIAGNOSIS — M25551 Pain in right hip: Secondary | ICD-10-CM | POA: Diagnosis not present

## 2021-03-21 DIAGNOSIS — R2689 Other abnormalities of gait and mobility: Secondary | ICD-10-CM | POA: Diagnosis not present

## 2021-03-23 DIAGNOSIS — R2689 Other abnormalities of gait and mobility: Secondary | ICD-10-CM | POA: Diagnosis not present

## 2021-03-23 DIAGNOSIS — M25551 Pain in right hip: Secondary | ICD-10-CM | POA: Diagnosis not present

## 2021-03-23 DIAGNOSIS — R2681 Unsteadiness on feet: Secondary | ICD-10-CM | POA: Diagnosis not present

## 2021-03-23 DIAGNOSIS — R293 Abnormal posture: Secondary | ICD-10-CM | POA: Diagnosis not present

## 2021-03-23 DIAGNOSIS — R52 Pain, unspecified: Secondary | ICD-10-CM | POA: Diagnosis not present

## 2021-03-23 DIAGNOSIS — M6281 Muscle weakness (generalized): Secondary | ICD-10-CM | POA: Diagnosis not present

## 2021-03-25 DIAGNOSIS — M25551 Pain in right hip: Secondary | ICD-10-CM | POA: Diagnosis not present

## 2021-03-25 DIAGNOSIS — R2681 Unsteadiness on feet: Secondary | ICD-10-CM | POA: Diagnosis not present

## 2021-03-25 DIAGNOSIS — M6281 Muscle weakness (generalized): Secondary | ICD-10-CM | POA: Diagnosis not present

## 2021-03-25 DIAGNOSIS — R293 Abnormal posture: Secondary | ICD-10-CM | POA: Diagnosis not present

## 2021-03-25 DIAGNOSIS — R52 Pain, unspecified: Secondary | ICD-10-CM | POA: Diagnosis not present

## 2021-03-25 DIAGNOSIS — R2689 Other abnormalities of gait and mobility: Secondary | ICD-10-CM | POA: Diagnosis not present

## 2021-03-28 ENCOUNTER — Encounter: Payer: Self-pay | Admitting: Orthopedic Surgery

## 2021-03-28 ENCOUNTER — Non-Acute Institutional Stay (SKILLED_NURSING_FACILITY): Payer: Medicare Other | Admitting: Orthopedic Surgery

## 2021-03-28 DIAGNOSIS — R197 Diarrhea, unspecified: Secondary | ICD-10-CM

## 2021-03-28 NOTE — Progress Notes (Signed)
Location:  [facility] Room Number: 26 Place of Service:  SNF [phone]) Provider: Windell Moulding, AGNP-C  [name], MD  Patient Care Team: [name], MD as PCP - General (Internal Medicine) [name], MD (Hematology and Oncology) [name], MD as Attending Physician (Urology) [name], MD (Gastroenterology)  Extended Emergency Contact Information Primary Emergency Contact: Fawzi, Lavinder Mobile Phone: [phone] Relation: Son Preferred language: English Interpreter needed? No Secondary Emergency Contact: Lightcap,Janet H Address: [address]          [address]          [place],  [address] [name] of [place] Phone: [phone] Mobile Phone: [phone] Relation: Spouse  Goals of care: Advanced Directive information Advanced Directives 03/17/2021  Does Patient Have a Medical Advance Directive? Yes  Type of Advance Directive Out of facility DNR (pink MOST or yellow form);Living will;Healthcare Power of Attorney  Does patient want to make changes to medical advance directive? No - Patient declined  Copy of [place] in Chart? Yes - validated most recent copy scanned in chart (See row information)  Would patient like information on creating a medical advance directive? -  Pre-existing out of facility DNR order (yellow form or pink MOST form) Pink MOST form placed in chart (order not valid for inpatient use);Yellow form placed in chart (order not valid for inpatient use)     Chief Complaint  Patient presents with  . Acute Visit    diarrhea    HPI:  Pt is a 87 y.o. male seen today for acute visit for diarrhea.   Nurse reports increased diarrhea in the past week. He denies abdominal pain, changes in stool or seedy stool. He was recently seen by Dr. Lyndel Safe 04/14 for constipation. Senna bid added daily for constipation. He states since starting senna, he has had diarrhea daily. Requesting  change in medication to lessen diarrhea.   Past Medical History:  Diagnosis Date  . Anemia   . Arthritis   . AVM (arteriovenous malformation)   . Barrett's esophagus   . BPH (benign prostatic hyperplasia)   . Cataract   . Constipation   . CPAP (continuous positive airway pressure) dependence   . Depression   . Diverticulosis    diverticular bleeding  . Diverticulosis   . GERD (gastroesophageal reflux disease)   . History of radiation therapy   . HOH (hard of hearing)    wears hearing aids  . Hyperlipidemia   . Hypoglycemia   . Lymphocytosis 06/2012  . Mastodynia   . Murmur    slight  . Nasal congestion   . Osteoporosis   . Osteoporosis   . Prostate cancer ([place])   . Sleep apnea    wears CPAP  . Urinary retention with incomplete bladder emptying    pt uses bathroom and within 30 mins needs to go again  . Vitamin D deficiency    Past Surgical History:  Procedure Laterality Date  . AMPUTATION TOE Right 09/20/2017   Procedure: Right 4th toe amputation;  Surgeon: Wylene Simmer, MD;  Location: [facility];  Service: Orthopedics;  Laterality: Right;  foot block  . AMPUTATION TOE Right 02/05/2019   Procedure: RIGHT GREAT TOE AMPUTATION;  Surgeon: Leandrew Koyanagi, MD;  Location: [facility];  Service: Orthopedics;  Laterality: Right;  . BACK SURGERY  04/2013  . back surgury     f  . BLEPHAROPLASTY Bilateral 02/23/15  .  CATARACT EXTRACTION    . COLONOSCOPY    . COLONOSCOPY N/A 10/02/2017   Procedure: COLONOSCOPY;  Surgeon: Doran Stabler, MD;  Location: [facility];  Service: Gastroenterology;  Laterality: N/A;  . COLONOSCOPY WITH PROPOFOL N/A 10/01/2017   Procedure: COLONOSCOPY WITH PROPOFOL;  Surgeon: Doran Stabler, MD;  [facility];  Service: Gastroenterology;  Laterality: N/A;  . COLONOSCOPY WITH PROPOFOL N/A 03/07/2018   Procedure: COLONOSCOPY WITH PROPOFOL;  Surgeon: [name], MD;  [facility];  Service:  Endoscopy;  Laterality: N/A;  . ESOPHAGOGASTRODUODENOSCOPY (EGD) WITH PROPOFOL N/A 10/01/2017   Procedure: ESOPHAGOGASTRODUODENOSCOPY (EGD) WITH PROPOFOL;  Surgeon: Doran Stabler, MD;  [facility];  Service: Gastroenterology;  Laterality: N/A;  . EYE SURGERY Bilateral   . FINGER SURGERY Right    right thumb  . HARDWARE REMOVAL Right 05/15/2016   Procedure: Right Lumbar Two-Lumbar Five Removal of Hardware;  Surgeon: Kristeen Miss, MD;  Location: [facility];  Service: Neurosurgery;  Laterality: Right;  Right L2-5 removal of hardware  . INGUINAL HERNIA REPAIR     x4  . IR RADIOLOGIST EVAL & MGMT  07/10/2018  . IR RADIOLOGIST EVAL & MGMT  09/05/2018  . IR SACROPLASTY BILATERAL  07/15/2018  . REPAIR SPIGELIAN HERNIA  2011  . [place] ,2014  . TONSILLECTOMY    . UPPER GASTROINTESTINAL ENDOSCOPY    . VASECTOMY      Allergies  Allergen Reactions  . Celebrex [Celecoxib] Other (See Comments)    Created stomach ulcers.  Yvette Rack [Cyclobenzaprine] Other (See Comments)    Caught in hiatal hernia and caused extreme pain    Outpatient Encounter Medications as of 03/28/2021  Medication Sig  . acetaminophen (TYLENOL) 500 MG tablet Take 500 mg by mouth 3 (three) times daily.   Marland Kitchen buPROPion (WELLBUTRIN SR) 150 MG 12 hr tablet Take 200 mg by mouth 2 (two) times daily.  . carboxymethylcellul-glycerin (REFRESH OPTIVE) 0.5-0.9 % ophthalmic solution Place 1 drop into both eyes 3 (three) times daily.  . cholecalciferol (VITAMIN D) 1000 UNITS tablet Take 1,000 Units by mouth daily.  . cyanocobalamin 1000 MCG tablet Take 1,000 mcg by mouth daily.  Marland Kitchen dextromethorphan-guaiFENesin (MUCINEX DM) 30-600 MG 12hr tablet Take 1 tablet by mouth 2 (two) times daily as needed.  . diclofenac Sodium (VOLTAREN) 1 % GEL Apply 2 g topically 4 (four) times daily.  Marland Kitchen docusate sodium (COLACE) 100 MG capsule Take 100 mg by mouth daily.  Marland Kitchen gabapentin (NEURONTIN) 300 MG capsule Take 300 mg by mouth 2  (two) times daily. due to numbness and tingling  . HYDROcodone-acetaminophen (NORCO/VICODIN) 5-325 MG tablet Take one tablet by mouth every 6 hours as needed for moderate pain. Do not exceed 4gm of Tylenol in 24 hours.  Marland Kitchen lactase (LACTAID) 3000 units tablet Take 4,500 Units by mouth 3 (three) times daily with meals.   Marland Kitchen loratadine (CLARITIN) 10 MG tablet Take 10 mg by mouth daily.  Marland Kitchen lovastatin (MEVACOR) 20 MG tablet Take 20 mg by mouth at bedtime.   . mirtazapine (REMERON) 7.5 MG tablet Take 7.5 mg by mouth at bedtime.  . Multiple Vitamins-Minerals (CENTRUM ADULTS PO) Take 1 tablet by mouth daily.  . Multiple Vitamins-Minerals (PRESERVISION AREDS 2 PO) Take 1 capsule by mouth 2 (two) times daily.  . pantoprazole (PROTONIX) 40 MG tablet Take 40 mg by mouth every morning.   . polyethylene glycol (MIRALAX / GLYCOLAX) 17 g packet Take 17 g by mouth  daily as needed.  . propranolol (INDERAL) 10 MG tablet Take 10 mg by mouth daily.   Marland Kitchen rOPINIRole (REQUIP) 1 MG tablet TAKE 2 TABLETS(2 MG) BY MOUTH AT BEDTIME  . senna (SENOKOT) 8.6 MG tablet Take 2 tablets by mouth daily.  Marland Kitchen zinc oxide 20 % ointment Apply 1 application topically as needed for irritation.   No facility-administered encounter medications on file as of 03/28/2021.    Review of Systems  Constitutional: Negative for activity change, appetite change, fatigue and fever.  Respiratory: Negative for cough, shortness of breath and wheezing.   Cardiovascular: Negative for chest pain and leg swelling.  Gastrointestinal: Positive for diarrhea. Negative for abdominal distention, abdominal pain, blood in stool, constipation and nausea.  Psychiatric/Behavioral: Negative for dysphoric mood. The patient is not nervous/anxious.     Immunization History  Administered Date(s) Administered  . Influenza, High Dose Seasonal PF 09/17/2019  . Influenza-Unspecified 09/16/2018, 09/07/2020  . Moderna SARS-COV2 Booster Vaccination 10/18/2020  . Moderna  Sars-Covid-2 Vaccination 12/08/2019, 01/05/2020  . Pneumococcal Conjugate-13 12/29/2013  . Pneumococcal Polysaccharide-23 09/08/2005  . Tdap 05/25/2005, 08/08/2018  . Zoster Recombinat (Shingrix) 10/17/2017, 01/25/2018   Pertinent  Health Maintenance Due  Topic Date Due  . OPHTHALMOLOGY EXAM  Never done  . URINE MICROALBUMIN  Never done  . FOOT EXAM  09/28/2019  . INFLUENZA VACCINE  07/04/2021  . PNA vac Low Risk Adult  Completed  . HEMOGLOBIN A1C  Discontinued   Fall Risk  01/01/2019 09/24/2018 01/04/2017 10/06/2016 09/29/2016  Falls in the past year? 0 No No No No   Functional Status Survey:    Vitals:   03/28/21 1433  BP: 122/73  Pulse: 72  Resp: 20  Temp: (!) 96.3 F (35.7 C)  SpO2: 95%  Weight: 157 lb 9.6 oz (71.5 kg)  Height: 5\' 6"  (1.676 m)   Body mass index is 25.44 kg/m. Physical Exam Vitals reviewed.  Constitutional:      General: He is not in acute distress. HENT:     Head: Normocephalic.  Cardiovascular:     Rate and Rhythm: Normal rate and regular rhythm.     Pulses: Normal pulses.     Heart sounds: Normal heart sounds. No murmur heard.   Pulmonary:     Effort: Pulmonary effort is normal. No respiratory distress.     Breath sounds: Normal breath sounds. No wheezing.  Abdominal:     General: There is no distension.     Palpations: Abdomen is soft.     Tenderness: There is no abdominal tenderness.     Comments: Hyperactive bowel sounds x 4  Skin:    General: Skin is warm and dry.     Capillary Refill: Capillary refill takes less than 2 seconds.  Neurological:     General: No focal deficit present.     Mental Status: He is alert and oriented to person, place, and time.     Motor: Weakness present.     Gait: Gait abnormal.     Comments: wheelchair  Psychiatric:        Mood and Affect: Mood normal.        Thought Content: Thought content normal.     Labs reviewed: Recent Labs    12/30/20 0000 02/11/21 0000 02/14/21 0000  NA 139 137 139   K 4.3 4.3 4.2  CL 110* 106 107  CO2 20 23* 24*  BUN 21 21 16   CREATININE 0.7 0.9 0.8  CALCIUM 8.7 8.7 9.0   Recent Labs  12/30/20 0000 02/11/21 0000 02/14/21 0000  AST 30 29 39  ALT 10 9* 15  ALKPHOS 91 91 89  ALBUMIN 3.4* 3.6 3.4*   Recent Labs    12/30/20 0000 02/11/21 0000 02/14/21 0000  WBC 8.1 12.4 7.9  NEUTROABS 5,775.00 10,639.00 6,012.00  HGB 12.0* 12.7* 12.9*  HCT 36* 38* 40*  PLT 260 260 264   Lab Results  Component Value Date   TSH 2.68 12/10/2020   Lab Results  Component Value Date   HGBA1C 5.2 08/29/2018   Lab Results  Component Value Date   CHOL 158 08/29/2018   HDL 34 (A) 05/06/2020   LDLCALC 103 05/06/2020   TRIG 154 05/06/2020    Significant Diagnostic Results in last 30 days:  No results found.  Assessment/Plan 1. Diarrhea, unspecified type - suspect senna with miralax causing increased diarrhea - miralax daily prn - will decrease senna to po daily  - encourage oral hydration  Family/ staff Communication: plan discussed with patient and nurse  Labs/tests ordered: none

## 2021-03-29 DIAGNOSIS — M25551 Pain in right hip: Secondary | ICD-10-CM | POA: Diagnosis not present

## 2021-03-29 DIAGNOSIS — R293 Abnormal posture: Secondary | ICD-10-CM | POA: Diagnosis not present

## 2021-03-29 DIAGNOSIS — R2689 Other abnormalities of gait and mobility: Secondary | ICD-10-CM | POA: Diagnosis not present

## 2021-03-29 DIAGNOSIS — R52 Pain, unspecified: Secondary | ICD-10-CM | POA: Diagnosis not present

## 2021-03-29 DIAGNOSIS — R2681 Unsteadiness on feet: Secondary | ICD-10-CM | POA: Diagnosis not present

## 2021-03-29 DIAGNOSIS — M6281 Muscle weakness (generalized): Secondary | ICD-10-CM | POA: Diagnosis not present

## 2021-03-31 DIAGNOSIS — M25551 Pain in right hip: Secondary | ICD-10-CM | POA: Diagnosis not present

## 2021-03-31 DIAGNOSIS — R293 Abnormal posture: Secondary | ICD-10-CM | POA: Diagnosis not present

## 2021-03-31 DIAGNOSIS — R079 Chest pain, unspecified: Secondary | ICD-10-CM | POA: Diagnosis not present

## 2021-03-31 DIAGNOSIS — R52 Pain, unspecified: Secondary | ICD-10-CM | POA: Diagnosis not present

## 2021-03-31 DIAGNOSIS — M6281 Muscle weakness (generalized): Secondary | ICD-10-CM | POA: Diagnosis not present

## 2021-03-31 DIAGNOSIS — R2689 Other abnormalities of gait and mobility: Secondary | ICD-10-CM | POA: Diagnosis not present

## 2021-03-31 DIAGNOSIS — R2681 Unsteadiness on feet: Secondary | ICD-10-CM | POA: Diagnosis not present

## 2021-04-01 ENCOUNTER — Encounter: Payer: Self-pay | Admitting: Orthopedic Surgery

## 2021-04-01 ENCOUNTER — Non-Acute Institutional Stay (SKILLED_NURSING_FACILITY): Payer: Medicare Other | Admitting: Orthopedic Surgery

## 2021-04-01 DIAGNOSIS — R319 Hematuria, unspecified: Secondary | ICD-10-CM

## 2021-04-01 NOTE — Progress Notes (Signed)
Location:  [facility] Room Number: 26 Place of Service:  SNF [phone]) Provider: Windell Moulding, AGNP-C  [name], MD  Patient Care Team: [name], MD as PCP - General (Internal Medicine) [name], MD (Hematology and Oncology) [name], MD as Attending Physician (Urology) [name], MD (Gastroenterology)  Extended Emergency Contact Information Primary Emergency Contact: Emerick, [place] Mobile Phone: [phone] Relation: Son Preferred language: English Interpreter needed? No Secondary Emergency Contact: Boyd,Janet H Address: [address]          [address]          [place], [place] [address] [name] of [place] Phone: [phone] Mobile Phone: [phone] Relation: Spouse  Goals of care: Advanced Directive information Advanced Directives 03/17/2021  Does Patient Have a Medical Advance Directive? Yes  Type of Advance Directive Out of facility DNR (pink MOST or yellow form);Living will;Healthcare Power of Attorney  Does patient want to make changes to medical advance directive? No - Patient declined  Copy of [place] in Chart? Yes - validated most recent copy scanned in chart (See row information)  Would patient like information on creating a medical advance directive? -  Pre-existing out of facility DNR order (yellow form or pink MOST form) Pink MOST form placed in chart (order not valid for inpatient use);Yellow form placed in chart (order not valid for inpatient use)     Chief Complaint  Patient presents with  . Acute Visit    hematuria    HPI:  Pt is a 87 y.o. male seen today for acute visit for hematuria.   Nurse reports blood- tinged urine from foley. History of urinary retention, he has a chronic indwelling catheter- coude/63F. This morning it appears cna found him with his leg strap by the ankle. He denies pain or trauma to penis/foley. A few hours later urine began to appear  blood-tinged, then red.   Nurse does not report any other concerns, vitals stable.    Past Medical History:  Diagnosis Date  . Anemia   . Arthritis   . AVM (arteriovenous malformation)   . Barrett's esophagus   . BPH (benign prostatic hyperplasia)   . Cataract   . Constipation   . CPAP (continuous positive airway pressure) dependence   . Depression   . Diverticulosis    diverticular bleeding  . Diverticulosis   . GERD (gastroesophageal reflux disease)   . History of radiation therapy   . HOH (hard of hearing)    wears hearing aids  . Hyperlipidemia   . Hypoglycemia   . Lymphocytosis 06/2012  . Mastodynia   . Murmur    slight  . Nasal congestion   . Osteoporosis   . Osteoporosis   . Prostate cancer ([facility])   . Sleep apnea    wears CPAP  . Urinary retention with incomplete bladder emptying    pt uses bathroom and within 30 mins needs to go again  . Vitamin D deficiency    Past Surgical History:  Procedure Laterality Date  . AMPUTATION TOE Right 09/20/2017   Procedure: Right 4th toe amputation;  Surgeon: Wylene Simmer, MD;  Location: [facility];  Service: Orthopedics;  Laterality: Right;  foot block  . AMPUTATION TOE Right 02/05/2019   Procedure: RIGHT GREAT TOE AMPUTATION;  Surgeon: Leandrew Koyanagi, MD;  Location: [facility];  Service: Orthopedics;  Laterality: Right;  . BACK SURGERY  04/2013  . back surgury  f  . BLEPHAROPLASTY Bilateral 02/23/15  . CATARACT EXTRACTION    . COLONOSCOPY    . COLONOSCOPY N/A 10/02/2017   Procedure: COLONOSCOPY;  Surgeon: Doran Stabler, MD;  Location: [facility];  Service: Gastroenterology;  Laterality: N/A;  . COLONOSCOPY WITH PROPOFOL N/A 10/01/2017   Procedure: COLONOSCOPY WITH PROPOFOL;  Surgeon: Doran Stabler, MD;  Location: [facility];  Service: Gastroenterology;  Laterality: N/A;  . COLONOSCOPY WITH PROPOFOL N/A 03/07/2018   Procedure: COLONOSCOPY WITH PROPOFOL;  Surgeon: [name],  MD;  Location: [facility];  Service: Endoscopy;  Laterality: N/A;  . ESOPHAGOGASTRODUODENOSCOPY (EGD) WITH PROPOFOL N/A 10/01/2017   Procedure: ESOPHAGOGASTRODUODENOSCOPY (EGD) WITH PROPOFOL;  Surgeon: Doran Stabler, MD;  Location: [facility];  Service: Gastroenterology;  Laterality: N/A;  . EYE SURGERY Bilateral   . FINGER SURGERY Right    right thumb  . HARDWARE REMOVAL Right 05/15/2016   Procedure: Right Lumbar Two-Lumbar Five Removal of Hardware;  Surgeon: Kristeen Miss, MD;  Location: [facility];  Service: Neurosurgery;  Laterality: Right;  Right L2-5 removal of hardware  . INGUINAL HERNIA REPAIR     x4  . IR RADIOLOGIST EVAL & MGMT  07/10/2018  . IR RADIOLOGIST EVAL & MGMT  09/05/2018  . IR SACROPLASTY BILATERAL  07/15/2018  . REPAIR SPIGELIAN HERNIA  2011  . [place] ,2014  . TONSILLECTOMY    . UPPER GASTROINTESTINAL ENDOSCOPY    . VASECTOMY      Allergies  Allergen Reactions  . Celebrex [Celecoxib] Other (See Comments)    Created stomach ulcers.  Yvette Rack [Cyclobenzaprine] Other (See Comments)    Caught in hiatal hernia and caused extreme pain    Outpatient Encounter Medications as of 04/01/2021  Medication Sig  . acetaminophen (TYLENOL) 500 MG tablet Take 500 mg by mouth 3 (three) times daily.   Marland Kitchen buPROPion (WELLBUTRIN SR) 150 MG 12 hr tablet Take 200 mg by mouth 2 (two) times daily.  . carboxymethylcellul-glycerin (REFRESH OPTIVE) 0.5-0.9 % ophthalmic solution Place 1 drop into both eyes 3 (three) times daily.  . cholecalciferol (VITAMIN D) 1000 UNITS tablet Take 1,000 Units by mouth daily.  . cyanocobalamin 1000 MCG tablet Take 1,000 mcg by mouth daily.  Marland Kitchen dextromethorphan-guaiFENesin (MUCINEX DM) 30-600 MG 12hr tablet Take 1 tablet by mouth 2 (two) times daily as needed.  . diclofenac Sodium (VOLTAREN) 1 % GEL Apply 2 g topically 4 (four) times daily.  Marland Kitchen docusate sodium (COLACE) 100 MG capsule Take 100 mg by mouth daily.  Marland Kitchen gabapentin (NEURONTIN) 300  MG capsule Take 300 mg by mouth 2 (two) times daily. due to numbness and tingling  . HYDROcodone-acetaminophen (NORCO/VICODIN) 5-325 MG tablet Take one tablet by mouth every 6 hours as needed for moderate pain. Do not exceed 4gm of Tylenol in 24 hours.  Marland Kitchen lactase (LACTAID) 3000 units tablet Take 4,500 Units by mouth 3 (three) times daily with meals.   Marland Kitchen loratadine (CLARITIN) 10 MG tablet Take 10 mg by mouth daily.  Marland Kitchen lovastatin (MEVACOR) 20 MG tablet Take 20 mg by mouth at bedtime.   . mirtazapine (REMERON) 7.5 MG tablet Take 7.5 mg by mouth at bedtime.  . Multiple Vitamins-Minerals (CENTRUM ADULTS PO) Take 1 tablet by mouth daily.  . Multiple Vitamins-Minerals (PRESERVISION AREDS 2 PO) Take 1 capsule by mouth 2 (two) times daily.  . pantoprazole (PROTONIX) 40 MG tablet Take 40 mg by mouth every morning.   . polyethylene glycol (MIRALAX / GLYCOLAX)  17 g packet Take 17 g by mouth daily as needed.  . propranolol (INDERAL) 10 MG tablet Take 10 mg by mouth daily.   Marland Kitchen rOPINIRole (REQUIP) 1 MG tablet TAKE 2 TABLETS(2 MG) BY MOUTH AT BEDTIME  . senna (SENOKOT) 8.6 MG tablet Take 1 tablet by mouth daily.  Marland Kitchen zinc oxide 20 % ointment Apply 1 application topically as needed for irritation.   No facility-administered encounter medications on file as of 04/01/2021.    Review of Systems  Constitutional: Negative for activity change, appetite change, fatigue and fever.  Respiratory: Negative for cough, shortness of breath and wheezing.   Cardiovascular: Negative for chest pain and leg swelling.  Genitourinary: Positive for hematuria. Negative for dysuria, penile discharge, penile pain, penile swelling, scrotal swelling and testicular pain.       Indwelling foley  Psychiatric/Behavioral: Negative for dysphoric mood. The patient is not nervous/anxious.     Immunization History  Administered Date(s) Administered  . Influenza, High Dose Seasonal PF 09/17/2019  . Influenza-Unspecified 09/16/2018, 09/07/2020   . Moderna SARS-COV2 Booster Vaccination 10/18/2020  . Moderna Sars-Covid-2 Vaccination 12/08/2019, 01/05/2020  . Pneumococcal Conjugate-13 12/29/2013  . Pneumococcal Polysaccharide-23 09/08/2005  . Tdap 05/25/2005, 08/08/2018  . Zoster Recombinat (Shingrix) 10/17/2017, 01/25/2018   Pertinent  Health Maintenance Due  Topic Date Due  . OPHTHALMOLOGY EXAM  Never done  . URINE MICROALBUMIN  Never done  . FOOT EXAM  09/28/2019  . INFLUENZA VACCINE  07/04/2021  . PNA vac Low Risk Adult  Completed  . HEMOGLOBIN A1C  Discontinued   Fall Risk  01/01/2019 09/24/2018 01/04/2017 10/06/2016 09/29/2016  Falls in the past year? 0 No No No No   Functional Status Survey:    Vitals:   04/01/21 1512  BP: 125/71  Pulse: 66  Resp: 18  Temp: 97.7 F (36.5 C)  SpO2: 95%  Weight: 157 lb 9.6 oz (71.5 kg)  Height: 5\' 6"  (1.676 m)   Body mass index is 25.44 kg/m. Physical Exam Vitals reviewed.  HENT:     Head: Normocephalic.  Cardiovascular:     Rate and Rhythm: Normal rate and regular rhythm.     Pulses: Normal pulses.     Heart sounds: Normal heart sounds. No murmur heard.   Pulmonary:     Effort: Pulmonary effort is normal. No respiratory distress.     Breath sounds: Normal breath sounds. No wheezing.  Genitourinary:    Comments: Urine appear maroon. No trauma to penis.  Musculoskeletal:     Right lower leg: No edema.     Left lower leg: No edema.  Skin:    General: Skin is warm and dry.     Capillary Refill: Capillary refill takes less than 2 seconds.  Neurological:     General: No focal deficit present.     Mental Status: He is alert and oriented to person, place, and time.     Motor: Weakness present.     Gait: Gait abnormal.     Comments: wheelchair  Psychiatric:        Mood and Affect: Mood normal.        Behavior: Behavior normal.     Labs reviewed: Recent Labs    12/30/20 0000 02/11/21 0000 02/14/21 0000  NA 139 137 139  K 4.3 4.3 4.2  CL 110* 106 107  CO2 20  23* 24*  BUN 21 21 16   CREATININE 0.7 0.9 0.8  CALCIUM 8.7 8.7 9.0   Recent Labs    12/30/20  0000 02/11/21 0000 02/14/21 0000  AST 30 29 39  ALT 10 9* 15  ALKPHOS 91 91 89  ALBUMIN 3.4* 3.6 3.4*   Recent Labs    12/30/20 0000 02/11/21 0000 02/14/21 0000  WBC 8.1 12.4 7.9  NEUTROABS 5,775.00 10,639.00 6,012.00  HGB 12.0* 12.7* 12.9*  HCT 36* 38* 40*  PLT 260 260 264   Lab Results  Component Value Date   TSH 2.68 12/10/2020   Lab Results  Component Value Date   HGBA1C 5.2 08/29/2018   Lab Results  Component Value Date   CHOL 158 08/29/2018   HDL 34 (A) 05/06/2020   LDLCALC 103 05/06/2020   TRIG 154 05/06/2020    Significant Diagnostic Results in last 30 days:  No results found.  Assessment/Plan - urine maroon, he denies pain, UOP normal - suspect foley was tugged since leg strap found near his ankle - suggest saline flush to foley with 10 cc Q shift x 5 days to prevent clotting/clog   Family/ staff Communication: plan discussed with patient and nurse  Labs/tests ordered: none

## 2021-04-04 DIAGNOSIS — H35373 Puckering of macula, bilateral: Secondary | ICD-10-CM | POA: Diagnosis not present

## 2021-04-04 DIAGNOSIS — H442A2 Degenerative myopia with choroidal neovascularization, left eye: Secondary | ICD-10-CM | POA: Diagnosis not present

## 2021-04-04 DIAGNOSIS — H43813 Vitreous degeneration, bilateral: Secondary | ICD-10-CM | POA: Diagnosis not present

## 2021-04-04 DIAGNOSIS — H353132 Nonexudative age-related macular degeneration, bilateral, intermediate dry stage: Secondary | ICD-10-CM | POA: Diagnosis not present

## 2021-04-05 DIAGNOSIS — M6281 Muscle weakness (generalized): Secondary | ICD-10-CM | POA: Diagnosis not present

## 2021-04-05 DIAGNOSIS — R531 Weakness: Secondary | ICD-10-CM | POA: Diagnosis not present

## 2021-04-05 DIAGNOSIS — M25551 Pain in right hip: Secondary | ICD-10-CM | POA: Diagnosis not present

## 2021-04-05 DIAGNOSIS — R2689 Other abnormalities of gait and mobility: Secondary | ICD-10-CM | POA: Diagnosis not present

## 2021-04-05 DIAGNOSIS — R1312 Dysphagia, oropharyngeal phase: Secondary | ICD-10-CM | POA: Diagnosis not present

## 2021-04-05 DIAGNOSIS — R293 Abnormal posture: Secondary | ICD-10-CM | POA: Diagnosis not present

## 2021-04-06 DIAGNOSIS — H3530 Unspecified macular degeneration: Secondary | ICD-10-CM | POA: Insufficient documentation

## 2021-04-08 DIAGNOSIS — M25551 Pain in right hip: Secondary | ICD-10-CM | POA: Diagnosis not present

## 2021-04-08 DIAGNOSIS — M6281 Muscle weakness (generalized): Secondary | ICD-10-CM | POA: Diagnosis not present

## 2021-04-08 DIAGNOSIS — R531 Weakness: Secondary | ICD-10-CM | POA: Diagnosis not present

## 2021-04-08 DIAGNOSIS — R2689 Other abnormalities of gait and mobility: Secondary | ICD-10-CM | POA: Diagnosis not present

## 2021-04-08 DIAGNOSIS — R293 Abnormal posture: Secondary | ICD-10-CM | POA: Diagnosis not present

## 2021-04-08 DIAGNOSIS — R1312 Dysphagia, oropharyngeal phase: Secondary | ICD-10-CM | POA: Diagnosis not present

## 2021-04-11 DIAGNOSIS — M25551 Pain in right hip: Secondary | ICD-10-CM | POA: Diagnosis not present

## 2021-04-11 DIAGNOSIS — R293 Abnormal posture: Secondary | ICD-10-CM | POA: Diagnosis not present

## 2021-04-11 DIAGNOSIS — M6281 Muscle weakness (generalized): Secondary | ICD-10-CM | POA: Diagnosis not present

## 2021-04-11 DIAGNOSIS — R1312 Dysphagia, oropharyngeal phase: Secondary | ICD-10-CM | POA: Diagnosis not present

## 2021-04-11 DIAGNOSIS — R2689 Other abnormalities of gait and mobility: Secondary | ICD-10-CM | POA: Diagnosis not present

## 2021-04-11 DIAGNOSIS — R531 Weakness: Secondary | ICD-10-CM | POA: Diagnosis not present

## 2021-04-12 DIAGNOSIS — R531 Weakness: Secondary | ICD-10-CM | POA: Diagnosis not present

## 2021-04-12 DIAGNOSIS — M6281 Muscle weakness (generalized): Secondary | ICD-10-CM | POA: Diagnosis not present

## 2021-04-12 DIAGNOSIS — R2689 Other abnormalities of gait and mobility: Secondary | ICD-10-CM | POA: Diagnosis not present

## 2021-04-12 DIAGNOSIS — M25551 Pain in right hip: Secondary | ICD-10-CM | POA: Diagnosis not present

## 2021-04-12 DIAGNOSIS — R293 Abnormal posture: Secondary | ICD-10-CM | POA: Diagnosis not present

## 2021-04-12 DIAGNOSIS — R1312 Dysphagia, oropharyngeal phase: Secondary | ICD-10-CM | POA: Diagnosis not present

## 2021-04-13 ENCOUNTER — Non-Acute Institutional Stay (SKILLED_NURSING_FACILITY): Payer: Medicare Other | Admitting: Orthopedic Surgery

## 2021-04-13 ENCOUNTER — Encounter: Payer: Self-pay | Admitting: Orthopedic Surgery

## 2021-04-13 DIAGNOSIS — G629 Polyneuropathy, unspecified: Secondary | ICD-10-CM

## 2021-04-13 DIAGNOSIS — H6123 Impacted cerumen, bilateral: Secondary | ICD-10-CM | POA: Diagnosis not present

## 2021-04-13 DIAGNOSIS — R339 Retention of urine, unspecified: Secondary | ICD-10-CM | POA: Diagnosis not present

## 2021-04-13 DIAGNOSIS — R634 Abnormal weight loss: Secondary | ICD-10-CM

## 2021-04-13 DIAGNOSIS — F339 Major depressive disorder, recurrent, unspecified: Secondary | ICD-10-CM | POA: Diagnosis not present

## 2021-04-13 DIAGNOSIS — R531 Weakness: Secondary | ICD-10-CM

## 2021-04-13 DIAGNOSIS — R293 Abnormal posture: Secondary | ICD-10-CM | POA: Diagnosis not present

## 2021-04-13 DIAGNOSIS — K219 Gastro-esophageal reflux disease without esophagitis: Secondary | ICD-10-CM | POA: Diagnosis not present

## 2021-04-13 DIAGNOSIS — M6281 Muscle weakness (generalized): Secondary | ICD-10-CM | POA: Diagnosis not present

## 2021-04-13 DIAGNOSIS — R1312 Dysphagia, oropharyngeal phase: Secondary | ICD-10-CM | POA: Diagnosis not present

## 2021-04-13 DIAGNOSIS — R197 Diarrhea, unspecified: Secondary | ICD-10-CM | POA: Diagnosis not present

## 2021-04-13 DIAGNOSIS — R319 Hematuria, unspecified: Secondary | ICD-10-CM

## 2021-04-13 DIAGNOSIS — M25551 Pain in right hip: Secondary | ICD-10-CM | POA: Diagnosis not present

## 2021-04-13 DIAGNOSIS — R2689 Other abnormalities of gait and mobility: Secondary | ICD-10-CM | POA: Diagnosis not present

## 2021-04-13 NOTE — Progress Notes (Signed)
error 

## 2021-04-13 NOTE — Progress Notes (Signed)
Location:   [place] Room Number: [place] of Service:  SNF [phone]) Provider:  Windell Moulding, NP    Patient Care Team: [name], MD as PCP - General (Internal Medicine) [name], MD (Hematology and Oncology) [name], MD as Attending Physician (Urology) [name], MD (Gastroenterology)  Extended Emergency Contact Information Primary Emergency Contact: Taeden, Geller Mobile Phone: [phone] Relation: Son Preferred language: English Interpreter needed? No Secondary Emergency Contact: Ayre,Janet H Address: [address]          [address]          [place], [place] [address] [name] of [place] Phone: [phone] Mobile Phone: [phone] Relation: Spouse  Code Status:  DNR Goals of care: Advanced Directive information Advanced Directives 04/13/2021  Does Patient Have a Medical Advance Directive? Yes  Type of Paramedic of [place];Living will  Does patient want to make changes to medical advance directive? No - Patient declined  Copy of [place] in Chart? Yes - validated most recent copy scanned in chart (See row information)  Would patient like information on creating a medical advance directive? -  Pre-existing out of facility DNR order (yellow form or pink MOST form) Yellow form placed in chart (order not valid for inpatient use);Pink MOST form placed in chart (order not valid for inpatient use)     Chief Complaint  Patient presents with  . Medical Management of Chronic Issues    Routine follow up.    HPI:  Pt is a 87 y.o. male seen today for medical management of chronic diseases.    He resides on the skilled unit at [facility] due to multiple medical problems. History includes: hypertension, CAD, OSA, GERD, Barrett's esophagus, dysphagia, recent c.diff diarrhea, neuropathy, diverticular disease, osteomyelitis with right great tow amputation, prostate  cancer, urinary retention with foley use, mild cognitive impairment and depression.   Depression ongoing, Wellbutrin increased one month ago, also on Remeron. Continues to eat about 50-75% of meals. Given one chocolate boost per day. He is more   Recent weights are as follows:  05/04- 153.7 lbs  04/01- 157.6 lbs  03/01- 160.2 lbs  02/01- 155 lbs  04/27 he fell forward while trying to get his glasses. He c/o rib pain after incident, CXR negative for rib fracture. Denies pain in his chest today.   04/29 he was found to have hematuria in his foley collection bag. Foley strap was found by his ankles, suspected mild urethral trauma. Foley flushed with saline bid for 5 days to avoid clots. Today, urine is clear.   05/04 PT started for strength and gait training. Continues to use wheelchair for ambulation.   Diarrhea resolved after reducing senna to daily and miralax prn.    Past Medical History:  Diagnosis Date  . Anemia   . Arthritis   . AVM (arteriovenous malformation)   . Barrett's esophagus   . BPH (benign prostatic hyperplasia)   . Cataract   . Constipation   . CPAP (continuous positive airway pressure) dependence   . Depression   . Diverticulosis    diverticular bleeding  . Diverticulosis   . GERD (gastroesophageal reflux disease)   . History of radiation therapy   . HOH (hard of hearing)    wears hearing aids  . Hyperlipidemia   . Hypoglycemia   . Lymphocytosis 06/2012  . Mastodynia   . Murmur    slight  . Nasal  congestion   . Osteoporosis   . Osteoporosis   . Prostate cancer ([place])   . Sleep apnea    wears CPAP  . Urinary retention with incomplete bladder emptying    pt uses bathroom and within 30 mins needs to go again  . Vitamin D deficiency    Past Surgical History:  Procedure Laterality Date  . AMPUTATION TOE Right 09/20/2017   Procedure: Right 4th toe amputation;  Surgeon: Wylene Simmer, MD;  Location: [facility];  Service: Orthopedics;   Laterality: Right;  foot block  . AMPUTATION TOE Right 02/05/2019   Procedure: RIGHT GREAT TOE AMPUTATION;  Surgeon: Leandrew Koyanagi, MD;  Location: [facility];  Service: Orthopedics;  Laterality: Right;  . BACK SURGERY  04/2013  . back surgury     f  . BLEPHAROPLASTY Bilateral 02/23/15  . CATARACT EXTRACTION    . COLONOSCOPY    . COLONOSCOPY N/A 10/02/2017   Procedure: COLONOSCOPY;  Surgeon: Doran Stabler, MD;  Location: [facility];  Service: Gastroenterology;  Laterality: N/A;  . COLONOSCOPY WITH PROPOFOL N/A 10/01/2017   Procedure: COLONOSCOPY WITH PROPOFOL;  Surgeon: Doran Stabler, MD;  Location: [facility];  Service: Gastroenterology;  Laterality: N/A;  . COLONOSCOPY WITH PROPOFOL N/A 03/07/2018   Procedure: COLONOSCOPY WITH PROPOFOL;  Surgeon: [name], MD;  Location: [facility];  Service: Endoscopy;  Laterality: N/A;  . ESOPHAGOGASTRODUODENOSCOPY (EGD) WITH PROPOFOL N/A 10/01/2017   Procedure: ESOPHAGOGASTRODUODENOSCOPY (EGD) WITH PROPOFOL;  Surgeon: Doran Stabler, MD;  Location: [facility];  Service: Gastroenterology;  Laterality: N/A;  . EYE SURGERY Bilateral   . FINGER SURGERY Right    right thumb  . HARDWARE REMOVAL Right 05/15/2016   Procedure: Right Lumbar Two-Lumbar Five Removal of Hardware;  Surgeon: Kristeen Miss, MD;  Location: [facility];  Service: Neurosurgery;  Laterality: Right;  Right L2-5 removal of hardware  . INGUINAL HERNIA REPAIR     x4  . IR RADIOLOGIST EVAL & MGMT  07/10/2018  . IR RADIOLOGIST EVAL & MGMT  09/05/2018  . IR SACROPLASTY BILATERAL  07/15/2018  . REPAIR SPIGELIAN HERNIA  2011  . [place] ,2014  . TONSILLECTOMY    . UPPER GASTROINTESTINAL ENDOSCOPY    . VASECTOMY      Allergies  Allergen Reactions  . Celebrex [Celecoxib] Other (See Comments)    Created stomach ulcers.  Yvette Rack [Cyclobenzaprine] Other (See Comments)    Caught in hiatal hernia and caused extreme pain    Allergies as of  04/13/2021      Reactions   Celebrex [celecoxib] Other (See Comments)   Created stomach ulcers.   Flexeril [cyclobenzaprine] Other (See Comments)   Caught in hiatal hernia and caused extreme pain      Medication List       Accurate as of Apr 13, 2021 10:13 AM. If you have any questions, ask your nurse or doctor.        STOP taking these medications   docusate sodium 100 MG capsule Commonly known as: COLACE Stopped by: Yvonna Alanis, NP     TAKE these medications   acetaminophen 500 MG tablet Commonly known as: TYLENOL Take 500 mg by mouth 3 (three) times daily.   buPROPion 150 MG 12 hr tablet Commonly known as: WELLBUTRIN SR Take 200 mg by mouth 2 (two) times daily.   carbamide peroxide 6.5 % OTIC solution Commonly known as: DEBROX Place 5-10 drops into both  ears every 30 (thirty) days. Flush ears every 3 months due to excessive cercumen build up Once A Day on the 15th of Every 3rd Month   carboxymethylcellul-glycerin 0.5-0.9 % ophthalmic solution Commonly known as: REFRESH OPTIVE Place 1 drop into both eyes 3 (three) times daily.   CENTRUM ADULTS PO Take 1 tablet by mouth daily.   PRESERVISION AREDS 2 PO Take 1 capsule by mouth 2 (two) times daily.   cholecalciferol 1000 units tablet Commonly known as: VITAMIN D Take 1,000 Units by mouth daily.   cyanocobalamin 1000 MCG tablet Take 1,000 mcg by mouth daily.   dextromethorphan-guaiFENesin 30-600 MG 12hr tablet Commonly known as: MUCINEX DM Take 1 tablet by mouth 2 (two) times daily as needed.   diclofenac Sodium 1 % Gel Commonly known as: VOLTAREN Apply 2 g topically 4 (four) times daily.   gabapentin 300 MG capsule Commonly known as: NEURONTIN Take 300 mg by mouth 2 (two) times daily. due to numbness and tingling   HYDROcodone-acetaminophen 5-325 MG tablet Commonly known as: NORCO/VICODIN Take one tablet by mouth every 6 hours as needed for moderate pain. Do not exceed 4gm of Tylenol in 24 hours.    lactase 3000 units tablet Commonly known as: LACTAID Take 4,500 Units by mouth 3 (three) times daily with meals.   loratadine 10 MG tablet Commonly known as: CLARITIN Take 10 mg by mouth daily.   lovastatin 20 MG tablet Commonly known as: MEVACOR Take 20 mg by mouth at bedtime.   mirtazapine 7.5 MG tablet Commonly known as: REMERON Take 7.5 mg by mouth at bedtime.   pantoprazole 40 MG tablet Commonly known as: PROTONIX Take 40 mg by mouth every morning.   polyethylene glycol 17 g packet Commonly known as: MIRALAX / GLYCOLAX Take 17 g by mouth daily as needed.   propranolol 10 MG tablet Commonly known as: INDERAL Take 10 mg by mouth daily.   rOPINIRole 1 MG tablet Commonly known as: REQUIP TAKE 2 TABLETS(2 MG) BY MOUTH AT BEDTIME   senna 8.6 MG tablet Commonly known as: SENOKOT Take 1 tablet by mouth daily.   zinc oxide 20 % ointment Apply 1 application topically as needed for irritation.       Review of Systems  Constitutional: Negative for activity change, appetite change, fatigue and fever.  HENT: Positive for hearing loss and trouble swallowing. Negative for congestion.   Eyes: Positive for visual disturbance.       Glasses  Respiratory: Negative for cough, shortness of breath and wheezing.   Genitourinary: Negative for hematuria.       Urinary retention, chronic foley  Musculoskeletal: Positive for arthralgias and myalgias.  Skin: Negative.   Neurological: Positive for weakness and numbness. Negative for dizziness and headaches.  Psychiatric/Behavioral: Negative for dysphoric mood and sleep disturbance. The patient is not nervous/anxious.     Immunization History  Administered Date(s) Administered  . Influenza, High Dose Seasonal PF 09/17/2019  . Influenza-Unspecified 09/16/2018, 09/07/2020  . Moderna SARS-COV2 Booster Vaccination 10/18/2020  . Moderna Sars-Covid-2 Vaccination 12/08/2019, 01/05/2020  . Pneumococcal Conjugate-13 12/29/2013  .  Pneumococcal Polysaccharide-23 09/08/2005  . Tdap 05/25/2005, 08/08/2018  . Zoster Recombinat (Shingrix) 10/17/2017, 01/25/2018   Pertinent  Health Maintenance Due  Topic Date Due  . OPHTHALMOLOGY EXAM  Never done  . URINE MICROALBUMIN  Never done  . FOOT EXAM  09/28/2019  . INFLUENZA VACCINE  07/04/2021  . PNA vac Low Risk Adult  Completed  . HEMOGLOBIN A1C  Discontinued   Fall  Risk  01/01/2019 09/24/2018 01/04/2017 10/06/2016 09/29/2016  Falls in the past year? 0 No No No No   Functional Status Survey:    Vitals:   04/13/21 0957  BP: (!) 106/97  Pulse: (!) 57  Resp: 16  Temp: (!) 96.8 F (36 C)  SpO2: 99%  Weight: 153 lb 11.2 oz (69.7 kg)  Height: 5\' 6"  (1.676 m)   Body mass index is 24.81 kg/m. Physical Exam Vitals reviewed.  Constitutional:      General: He is not in acute distress. HENT:     Right Ear: There is impacted cerumen.     Left Ear: There is impacted cerumen.     Nose: Nose normal.     Mouth/Throat:     Mouth: Mucous membranes are moist.  Eyes:     General:        Right eye: No discharge.        Left eye: No discharge.  Neck:     Vascular: No carotid bruit.  Cardiovascular:     Rate and Rhythm: Normal rate and regular rhythm.     Pulses: Normal pulses.     Heart sounds: Normal heart sounds. No murmur heard.   Pulmonary:     Effort: Pulmonary effort is normal. No respiratory distress.     Breath sounds: Normal breath sounds. No wheezing.  Abdominal:     General: Bowel sounds are normal. There is no distension.     Palpations: Abdomen is soft.     Tenderness: There is no abdominal tenderness.  Genitourinary:    Comments: indewlling foley- urine yellow/clear Musculoskeletal:     Cervical back: Normal range of motion.     Right lower leg: No edema.     Left lower leg: No edema.     Comments: Hand grips 5/5  Lymphadenopathy:     Cervical: No cervical adenopathy.  Skin:    General: Skin is warm and dry.     Capillary Refill: Capillary  refill takes less than 2 seconds.  Neurological:     General: No focal deficit present.     Mental Status: He is alert. Mental status is at baseline.     Motor: Weakness present.     Gait: Gait abnormal.     Comments: wheelchair  Psychiatric:        Mood and Affect: Mood normal. Affect is flat.        Behavior: Behavior normal.        Cognition and Memory: Memory is impaired.     Comments: Forgetful during encounter     Labs reviewed: Recent Labs    12/30/20 0000 02/11/21 0000 02/14/21 0000  NA 139 137 139  K 4.3 4.3 4.2  CL 110* 106 107  CO2 20 23* 24*  BUN 21 21 16   CREATININE 0.7 0.9 0.8  CALCIUM 8.7 8.7 9.0   Recent Labs    12/30/20 0000 02/11/21 0000 02/14/21 0000  AST 30 29 39  ALT 10 9* 15  ALKPHOS 91 91 89  ALBUMIN 3.4* 3.6 3.4*   Recent Labs    12/30/20 0000 02/11/21 0000 02/14/21 0000  WBC 8.1 12.4 7.9  NEUTROABS 5,775.00 10,639.00 6,012.00  HGB 12.0* 12.7* 12.9*  HCT 36* 38* 40*  PLT 260 260 264   Lab Results  Component Value Date   TSH 2.68 12/10/2020   Lab Results  Component Value Date   HGBA1C 5.2 08/29/2018   Lab Results  Component Value Date   CHOL  158 08/29/2018   HDL 34 (A) 05/06/2020   LDLCALC 103 05/06/2020   TRIG 154 05/06/2020    Significant Diagnostic Results in last 30 days:  No results found.  Assessment/Plan 1. Hematuria, unspecified type - resolved, urine yellow - advised to keep leg stray over upper thigh  2. Diarrhea, unspecified type - resolved - cont daily senna and prn miralax  3. Depression, recurrent ([place]) - flat, appeared tired - cont wellbutrin 200 mg po bid - will increase remeron to 15 mg po QHS  4. Neuropathy - stable with gabaptnin  5. Weakness generalized - fall in last 2 weeks - PT/OT restarted for strength and balance training  6. Gastroesophageal reflux disease without esophagitis - stable with protonix  7. Urinary retention - cont indwelling foley and care protocols  8.  Bilateral impacted cerumen - some wax buildup in both ears,able to visualize TM - suggest flushing ears with warm water next shower  9. Weight loss - ongoing, suspect due to depression - increase remeron to 15 mg po QHS - cont Boost supplement    Family/ staff Communication: plan discussed with patient and nurse  Labs/tests ordered:  none

## 2021-04-14 DIAGNOSIS — M6281 Muscle weakness (generalized): Secondary | ICD-10-CM | POA: Diagnosis not present

## 2021-04-14 DIAGNOSIS — R531 Weakness: Secondary | ICD-10-CM | POA: Diagnosis not present

## 2021-04-14 DIAGNOSIS — R2689 Other abnormalities of gait and mobility: Secondary | ICD-10-CM | POA: Diagnosis not present

## 2021-04-14 DIAGNOSIS — R1312 Dysphagia, oropharyngeal phase: Secondary | ICD-10-CM | POA: Diagnosis not present

## 2021-04-14 DIAGNOSIS — M25551 Pain in right hip: Secondary | ICD-10-CM | POA: Diagnosis not present

## 2021-04-14 DIAGNOSIS — R293 Abnormal posture: Secondary | ICD-10-CM | POA: Diagnosis not present

## 2021-04-15 DIAGNOSIS — M25551 Pain in right hip: Secondary | ICD-10-CM | POA: Diagnosis not present

## 2021-04-15 DIAGNOSIS — M6281 Muscle weakness (generalized): Secondary | ICD-10-CM | POA: Diagnosis not present

## 2021-04-15 DIAGNOSIS — R2689 Other abnormalities of gait and mobility: Secondary | ICD-10-CM | POA: Diagnosis not present

## 2021-04-15 DIAGNOSIS — R531 Weakness: Secondary | ICD-10-CM | POA: Diagnosis not present

## 2021-04-15 DIAGNOSIS — R1312 Dysphagia, oropharyngeal phase: Secondary | ICD-10-CM | POA: Diagnosis not present

## 2021-04-15 DIAGNOSIS — R293 Abnormal posture: Secondary | ICD-10-CM | POA: Diagnosis not present

## 2021-04-18 DIAGNOSIS — M25551 Pain in right hip: Secondary | ICD-10-CM | POA: Diagnosis not present

## 2021-04-18 DIAGNOSIS — R531 Weakness: Secondary | ICD-10-CM | POA: Diagnosis not present

## 2021-04-18 DIAGNOSIS — R293 Abnormal posture: Secondary | ICD-10-CM | POA: Diagnosis not present

## 2021-04-18 DIAGNOSIS — M6281 Muscle weakness (generalized): Secondary | ICD-10-CM | POA: Diagnosis not present

## 2021-04-18 DIAGNOSIS — R1312 Dysphagia, oropharyngeal phase: Secondary | ICD-10-CM | POA: Diagnosis not present

## 2021-04-18 DIAGNOSIS — R2689 Other abnormalities of gait and mobility: Secondary | ICD-10-CM | POA: Diagnosis not present

## 2021-04-19 DIAGNOSIS — R293 Abnormal posture: Secondary | ICD-10-CM | POA: Diagnosis not present

## 2021-04-19 DIAGNOSIS — M25551 Pain in right hip: Secondary | ICD-10-CM | POA: Diagnosis not present

## 2021-04-19 DIAGNOSIS — M6281 Muscle weakness (generalized): Secondary | ICD-10-CM | POA: Diagnosis not present

## 2021-04-19 DIAGNOSIS — R531 Weakness: Secondary | ICD-10-CM | POA: Diagnosis not present

## 2021-04-19 DIAGNOSIS — R1312 Dysphagia, oropharyngeal phase: Secondary | ICD-10-CM | POA: Diagnosis not present

## 2021-04-19 DIAGNOSIS — R2689 Other abnormalities of gait and mobility: Secondary | ICD-10-CM | POA: Diagnosis not present

## 2021-04-20 DIAGNOSIS — R1312 Dysphagia, oropharyngeal phase: Secondary | ICD-10-CM | POA: Diagnosis not present

## 2021-04-20 DIAGNOSIS — R293 Abnormal posture: Secondary | ICD-10-CM | POA: Diagnosis not present

## 2021-04-20 DIAGNOSIS — M6281 Muscle weakness (generalized): Secondary | ICD-10-CM | POA: Diagnosis not present

## 2021-04-20 DIAGNOSIS — R2689 Other abnormalities of gait and mobility: Secondary | ICD-10-CM | POA: Diagnosis not present

## 2021-04-20 DIAGNOSIS — M25551 Pain in right hip: Secondary | ICD-10-CM | POA: Diagnosis not present

## 2021-04-20 DIAGNOSIS — R531 Weakness: Secondary | ICD-10-CM | POA: Diagnosis not present

## 2021-04-21 DIAGNOSIS — R293 Abnormal posture: Secondary | ICD-10-CM | POA: Diagnosis not present

## 2021-04-21 DIAGNOSIS — M25551 Pain in right hip: Secondary | ICD-10-CM | POA: Diagnosis not present

## 2021-04-21 DIAGNOSIS — R2689 Other abnormalities of gait and mobility: Secondary | ICD-10-CM | POA: Diagnosis not present

## 2021-04-21 DIAGNOSIS — R1312 Dysphagia, oropharyngeal phase: Secondary | ICD-10-CM | POA: Diagnosis not present

## 2021-04-21 DIAGNOSIS — M6281 Muscle weakness (generalized): Secondary | ICD-10-CM | POA: Diagnosis not present

## 2021-04-21 DIAGNOSIS — R531 Weakness: Secondary | ICD-10-CM | POA: Diagnosis not present

## 2021-04-22 DIAGNOSIS — M25551 Pain in right hip: Secondary | ICD-10-CM | POA: Diagnosis not present

## 2021-04-22 DIAGNOSIS — R1312 Dysphagia, oropharyngeal phase: Secondary | ICD-10-CM | POA: Diagnosis not present

## 2021-04-22 DIAGNOSIS — R531 Weakness: Secondary | ICD-10-CM | POA: Diagnosis not present

## 2021-04-22 DIAGNOSIS — R293 Abnormal posture: Secondary | ICD-10-CM | POA: Diagnosis not present

## 2021-04-22 DIAGNOSIS — R2689 Other abnormalities of gait and mobility: Secondary | ICD-10-CM | POA: Diagnosis not present

## 2021-04-22 DIAGNOSIS — M6281 Muscle weakness (generalized): Secondary | ICD-10-CM | POA: Diagnosis not present

## 2021-04-25 DIAGNOSIS — R2689 Other abnormalities of gait and mobility: Secondary | ICD-10-CM | POA: Diagnosis not present

## 2021-04-25 DIAGNOSIS — R293 Abnormal posture: Secondary | ICD-10-CM | POA: Diagnosis not present

## 2021-04-25 DIAGNOSIS — M25551 Pain in right hip: Secondary | ICD-10-CM | POA: Diagnosis not present

## 2021-04-25 DIAGNOSIS — M6281 Muscle weakness (generalized): Secondary | ICD-10-CM | POA: Diagnosis not present

## 2021-04-25 DIAGNOSIS — R1312 Dysphagia, oropharyngeal phase: Secondary | ICD-10-CM | POA: Diagnosis not present

## 2021-04-25 DIAGNOSIS — R531 Weakness: Secondary | ICD-10-CM | POA: Diagnosis not present

## 2021-04-26 DIAGNOSIS — R531 Weakness: Secondary | ICD-10-CM | POA: Diagnosis not present

## 2021-04-26 DIAGNOSIS — M25551 Pain in right hip: Secondary | ICD-10-CM | POA: Diagnosis not present

## 2021-04-26 DIAGNOSIS — R293 Abnormal posture: Secondary | ICD-10-CM | POA: Diagnosis not present

## 2021-04-26 DIAGNOSIS — R1312 Dysphagia, oropharyngeal phase: Secondary | ICD-10-CM | POA: Diagnosis not present

## 2021-04-26 DIAGNOSIS — M6281 Muscle weakness (generalized): Secondary | ICD-10-CM | POA: Diagnosis not present

## 2021-04-26 DIAGNOSIS — R2689 Other abnormalities of gait and mobility: Secondary | ICD-10-CM | POA: Diagnosis not present

## 2021-04-28 DIAGNOSIS — R1312 Dysphagia, oropharyngeal phase: Secondary | ICD-10-CM | POA: Diagnosis not present

## 2021-04-28 DIAGNOSIS — R2689 Other abnormalities of gait and mobility: Secondary | ICD-10-CM | POA: Diagnosis not present

## 2021-04-28 DIAGNOSIS — M25551 Pain in right hip: Secondary | ICD-10-CM | POA: Diagnosis not present

## 2021-04-28 DIAGNOSIS — M6281 Muscle weakness (generalized): Secondary | ICD-10-CM | POA: Diagnosis not present

## 2021-04-28 DIAGNOSIS — R531 Weakness: Secondary | ICD-10-CM | POA: Diagnosis not present

## 2021-04-28 DIAGNOSIS — R293 Abnormal posture: Secondary | ICD-10-CM | POA: Diagnosis not present

## 2021-04-29 DIAGNOSIS — R2689 Other abnormalities of gait and mobility: Secondary | ICD-10-CM | POA: Diagnosis not present

## 2021-04-29 DIAGNOSIS — M6281 Muscle weakness (generalized): Secondary | ICD-10-CM | POA: Diagnosis not present

## 2021-04-29 DIAGNOSIS — R1312 Dysphagia, oropharyngeal phase: Secondary | ICD-10-CM | POA: Diagnosis not present

## 2021-04-29 DIAGNOSIS — R531 Weakness: Secondary | ICD-10-CM | POA: Diagnosis not present

## 2021-04-29 DIAGNOSIS — R293 Abnormal posture: Secondary | ICD-10-CM | POA: Diagnosis not present

## 2021-04-29 DIAGNOSIS — M25551 Pain in right hip: Secondary | ICD-10-CM | POA: Diagnosis not present

## 2021-05-03 DIAGNOSIS — R293 Abnormal posture: Secondary | ICD-10-CM | POA: Diagnosis not present

## 2021-05-03 DIAGNOSIS — M25551 Pain in right hip: Secondary | ICD-10-CM | POA: Diagnosis not present

## 2021-05-03 DIAGNOSIS — R2689 Other abnormalities of gait and mobility: Secondary | ICD-10-CM | POA: Diagnosis not present

## 2021-05-03 DIAGNOSIS — R531 Weakness: Secondary | ICD-10-CM | POA: Diagnosis not present

## 2021-05-03 DIAGNOSIS — M6281 Muscle weakness (generalized): Secondary | ICD-10-CM | POA: Diagnosis not present

## 2021-05-03 DIAGNOSIS — R1312 Dysphagia, oropharyngeal phase: Secondary | ICD-10-CM | POA: Diagnosis not present

## 2021-05-04 DIAGNOSIS — A0472 Enterocolitis due to Clostridium difficile, not specified as recurrent: Secondary | ICD-10-CM | POA: Diagnosis not present

## 2021-05-05 DIAGNOSIS — A0472 Enterocolitis due to Clostridium difficile, not specified as recurrent: Secondary | ICD-10-CM | POA: Diagnosis not present

## 2021-05-06 ENCOUNTER — Encounter: Payer: Self-pay | Admitting: Nurse Practitioner

## 2021-05-06 ENCOUNTER — Non-Acute Institutional Stay (SKILLED_NURSING_FACILITY): Payer: Medicare Other | Admitting: Nurse Practitioner

## 2021-05-06 DIAGNOSIS — K219 Gastro-esophageal reflux disease without esophagitis: Secondary | ICD-10-CM | POA: Diagnosis not present

## 2021-05-06 DIAGNOSIS — G4733 Obstructive sleep apnea (adult) (pediatric): Secondary | ICD-10-CM

## 2021-05-06 DIAGNOSIS — R339 Retention of urine, unspecified: Secondary | ICD-10-CM

## 2021-05-06 DIAGNOSIS — A0472 Enterocolitis due to Clostridium difficile, not specified as recurrent: Secondary | ICD-10-CM | POA: Diagnosis not present

## 2021-05-06 DIAGNOSIS — R627 Adult failure to thrive: Secondary | ICD-10-CM | POA: Diagnosis not present

## 2021-05-06 DIAGNOSIS — G4752 REM sleep behavior disorder: Secondary | ICD-10-CM

## 2021-05-06 DIAGNOSIS — F339 Major depressive disorder, recurrent, unspecified: Secondary | ICD-10-CM

## 2021-05-06 DIAGNOSIS — E785 Hyperlipidemia, unspecified: Secondary | ICD-10-CM

## 2021-05-06 DIAGNOSIS — G629 Polyneuropathy, unspecified: Secondary | ICD-10-CM | POA: Diagnosis not present

## 2021-05-06 DIAGNOSIS — K5900 Constipation, unspecified: Secondary | ICD-10-CM

## 2021-05-06 DIAGNOSIS — M24575 Contracture, left foot: Secondary | ICD-10-CM

## 2021-05-06 DIAGNOSIS — R1319 Other dysphagia: Secondary | ICD-10-CM

## 2021-05-06 DIAGNOSIS — G250 Essential tremor: Secondary | ICD-10-CM

## 2021-05-06 DIAGNOSIS — Z9989 Dependence on other enabling machines and devices: Secondary | ICD-10-CM

## 2021-05-06 NOTE — Assessment & Plan Note (Signed)
Continue Mevacor.

## 2021-05-06 NOTE — Assessment & Plan Note (Signed)
Prn MiraLax is adequate.

## 2021-05-06 NOTE — Assessment & Plan Note (Signed)
saw Neurology 11/17/20, uses CPAP

## 2021-05-06 NOTE — Assessment & Plan Note (Signed)
His mood is stable, on Wellbutrin, Mirtazapine, TSH 2.68 12/09/20

## 2021-05-06 NOTE — Assessment & Plan Note (Signed)
Left foot flexion contracture, f/u podiatry, Ortho.

## 2021-05-06 NOTE — Progress Notes (Addendum)
Location:   [place] Room Number: [place] of Service:  SNF (31) Provider:  Raegan Sipp Otho Darner, NP    Patient Care Team: [name], MD as PCP - General (Internal Medicine) [name], MD (Hematology and Oncology) [name], MD as Attending Physician (Urology) [name], MD (Gastroenterology)  Extended Emergency Contact Information Primary Emergency Contact: Conn, Trombetta Mobile Phone: [phone] Relation: Son Preferred language: English Interpreter needed? No Secondary Emergency Contact: Everly,Janet H Address: [address]          [address]          [place], [place] [address] [name] of [place] Phone: [phone] Mobile Phone: [phone] Relation: Spouse  Code Status:  DNR Goals of care: Advanced Directive information Advanced Directives 05/06/2021  Does Patient Have a Medical Advance Directive? Yes  Type of Paramedic of [place];Living will;Out of facility DNR (pink MOST or yellow form)  Does patient want to make changes to medical advance directive? No - Patient declined  Copy of [place] in Chart? Yes - validated most recent copy scanned in chart (See row information)  Would patient like information on creating a medical advance directive? -  Pre-existing out of facility DNR order (yellow form or pink MOST form) Yellow form placed in chart (order not valid for inpatient use);Pink MOST form placed in chart (order not valid for inpatient use)     Chief Complaint  Patient presents with  . Medical Management of Chronic Issues    Routine follow up.   Marland Kitchen Health Maintenance    Discuss need for PNA vaccine, eye exam, urine microalbumin, and foot exam.     HPI:  Pt is a 87 y.o. male seen today for medical management of chronic diseases.     OSA, saw Neurology 11/17/20, uses CPAP  Hx ofRLE/REM, stable, on Requip 2mg  qd.Sleep study 10/25/20 Essential  tremor, stable, on Propranolol 10mg  qd.Bun/creat16/0.8 02/14/21 GERD, stable, on Pantoprazole 40mg  qd.Hgb 12.9 02/14/21 Neuropathic pain in BLE,takesGabapentin 300mg  bid, Tylenol 500mg  tid, Norco q6hr prn.c/o R 2nd 3rd hammer toes pain with walking, L 4th, 5th toes pain when touched, right lower back to the right leg pain when sitting in w/c for a long period of time. His mood is stable, on Wellbutrin, Mirtazapine, TSH 2.68 12/09/20 Foley chronic, f/u Urology Left foot flexion contracture, f/u podiatry, Ortho.  Constipation, prn MiraLax.   Hyperlipidemia, takes Mevacor  Adult failure to thrive, gradual weight loss, about #8 Ibs in the past month, regardless Mirtazapine.                             Past Medical History:  Diagnosis Date  . Anemia   . Arthritis   . AVM (arteriovenous malformation)   . Barrett's esophagus   . BPH (benign prostatic hyperplasia)   . Cataract   . Constipation   . CPAP (continuous positive airway pressure) dependence   . Depression   . Diverticulosis    diverticular bleeding  . Diverticulosis   . GERD (gastroesophageal reflux disease)   . History of radiation therapy   . HOH (hard of hearing)    wears hearing aids  . Hyperlipidemia   . Hypoglycemia   . Lymphocytosis 06/2012  . Mastodynia   . Murmur    slight  . Nasal congestion   . Osteoporosis   . Osteoporosis   . Prostate cancer (  [place])   . Sleep apnea    wears CPAP  . Urinary retention with incomplete bladder emptying    pt uses bathroom and within 30 mins needs to go again  . Vitamin D deficiency    Past Surgical History:  Procedure Laterality Date  . AMPUTATION TOE Right 09/20/2017   Procedure: Right 4th toe amputation;  Surgeon: Wylene Simmer, MD;  Location: [facility];  Service: Orthopedics;  Laterality: Right;  foot block  . AMPUTATION TOE Right 02/05/2019   Procedure: RIGHT GREAT TOE AMPUTATION;   Surgeon: Leandrew Koyanagi, MD;  Location: [facility];  Service: Orthopedics;  Laterality: Right;  . BACK SURGERY  04/2013  . back surgury     f  . BLEPHAROPLASTY Bilateral 02/23/15  . CATARACT EXTRACTION    . COLONOSCOPY    . COLONOSCOPY N/A 10/02/2017   Procedure: COLONOSCOPY;  Surgeon: Doran Stabler, MD;  Location: [facility];  Service: Gastroenterology;  Laterality: N/A;  . COLONOSCOPY WITH PROPOFOL N/A 10/01/2017   Procedure: COLONOSCOPY WITH PROPOFOL;  Surgeon: Doran Stabler, MD;  Location: [facility];  Service: Gastroenterology;  Laterality: N/A;  . COLONOSCOPY WITH PROPOFOL N/A 03/07/2018   Procedure: COLONOSCOPY WITH PROPOFOL;  Surgeon: [name], MD;  Location: [facility];  Service: Endoscopy;  Laterality: N/A;  . ESOPHAGOGASTRODUODENOSCOPY (EGD) WITH PROPOFOL N/A 10/01/2017   Procedure: ESOPHAGOGASTRODUODENOSCOPY (EGD) WITH PROPOFOL;  Surgeon: Doran Stabler, MD;  Location: [facility];  Service: Gastroenterology;  Laterality: N/A;  . EYE SURGERY Bilateral   . FINGER SURGERY Right    right thumb  . HARDWARE REMOVAL Right 05/15/2016   Procedure: Right Lumbar Two-Lumbar Five Removal of Hardware;  Surgeon: Kristeen Miss, MD;  Location: [facility];  Service: Neurosurgery;  Laterality: Right;  Right L2-5 removal of hardware  . INGUINAL HERNIA REPAIR     x4  . IR RADIOLOGIST EVAL & MGMT  07/10/2018  . IR RADIOLOGIST EVAL & MGMT  09/05/2018  . IR SACROPLASTY BILATERAL  07/15/2018  . REPAIR SPIGELIAN HERNIA  2011  . [place] ,2014  . TONSILLECTOMY    . UPPER GASTROINTESTINAL ENDOSCOPY    . VASECTOMY      Allergies  Allergen Reactions  . Celebrex [Celecoxib] Other (See Comments)    Created stomach ulcers.  Yvette Rack [Cyclobenzaprine] Other (See Comments)    Caught in hiatal hernia and caused extreme pain    Allergies as of 05/06/2021      Reactions   Celebrex [celecoxib] Other (See Comments)   Created stomach ulcers.   Flexeril  [cyclobenzaprine] Other (See Comments)   Caught in hiatal hernia and caused extreme pain      Medication List       Accurate as of May 06, 2021 11:59 PM. If you have any questions, ask your nurse or doctor.        acetaminophen 500 MG tablet Commonly known as: TYLENOL Take 500 mg by mouth 3 (three) times daily.   buPROPion 150 MG 12 hr tablet Commonly known as: WELLBUTRIN SR Take 200 mg by mouth 2 (two) times daily.   carbamide peroxide 6.5 % OTIC solution Commonly known as: DEBROX Place 5-10 drops into both ears every 30 (thirty) days. Flush ears every 3 months due to excessive cercumen build up Once A Day on the 15th of Every 3rd Month   carboxymethylcellul-glycerin 0.5-0.9 % ophthalmic solution Commonly known as: REFRESH OPTIVE Place 1 drop into both eyes  3 (three) times daily.   CENTRUM ADULTS PO Take 1 tablet by mouth daily.   PRESERVISION AREDS 2 PO Take 1 capsule by mouth 2 (two) times daily.   chlorhexidine 0.12 % solution Commonly known as: PERIDEX Use as directed 15 mLs in the mouth or throat at bedtime.   cholecalciferol 1000 units tablet Commonly known as: VITAMIN D Take 1,000 Units by mouth daily.   cyanocobalamin 1000 MCG tablet Take 1,000 mcg by mouth daily.   dextromethorphan-guaiFENesin 30-600 MG 12hr tablet Commonly known as: MUCINEX DM Take 1 tablet by mouth 2 (two) times daily as needed.   diclofenac Sodium 1 % Gel Commonly known as: VOLTAREN Apply 2 g topically 4 (four) times daily.   gabapentin 300 MG capsule Commonly known as: NEURONTIN Take 300 mg by mouth 2 (two) times daily. due to numbness and tingling   HYDROcodone-acetaminophen 5-325 MG tablet Commonly known as: NORCO/VICODIN Take one tablet by mouth every 6 hours as needed for moderate pain. Do not exceed 4gm of Tylenol in 24 hours.   lactase 3000 units tablet Commonly known as: LACTAID Take 4,500 Units by mouth 3 (three) times daily with meals.   loratadine 10 MG  tablet Commonly known as: CLARITIN Take 10 mg by mouth daily.   lovastatin 20 MG tablet Commonly known as: MEVACOR Take 20 mg by mouth at bedtime.   mirtazapine 7.5 MG tablet Commonly known as: REMERON Take 15 mg by mouth at bedtime.   pantoprazole 40 MG tablet Commonly known as: PROTONIX Take 40 mg by mouth every morning.   polyethylene glycol 17 g packet Commonly known as: MIRALAX / GLYCOLAX Take 17 g by mouth daily as needed.   propranolol 10 MG tablet Commonly known as: INDERAL Take 10 mg by mouth daily.   rOPINIRole 1 MG tablet Commonly known as: REQUIP TAKE 2 TABLETS(2 MG) BY MOUTH AT BEDTIME   senna 8.6 MG tablet Commonly known as: SENOKOT Take 1 tablet by mouth daily.   zinc oxide 20 % ointment Apply 1 application topically as needed for irritation.       Review of Systems  Constitutional: Positive for appetite change, fatigue and unexpected weight change. Negative for activity change and fever.       Gradual weight loss, about #8Ibs in the past month.   HENT: Positive for congestion and hearing loss. Negative for postnasal drip, sinus pain and voice change.   Eyes: Negative for visual disturbance.  Respiratory: Positive for shortness of breath. Negative for cough and wheezing.        Chronic non productive cough. DOE  Cardiovascular: Negative for chest pain and leg swelling.  Gastrointestinal: Negative for abdominal pain and constipation.       Fecal incontinent.   Genitourinary: Positive for difficulty urinating.       Foley  Musculoskeletal: Positive for arthralgias, back pain, gait problem and neck pain.       R 2nd 3rd hammer toes pain with walking, L 4th, 5th toes pain when touched, right lower back to the right leg pain when sitting in w/c for a long period of time.   Skin: Negative for color change.  Neurological: Positive for tremors. Negative for speech difficulty and light-headedness.       Memory lapses. Essential tremor, peripheral  neuropathy, RLS  Psychiatric/Behavioral: Negative for behavioral problems and sleep disturbance.    Immunization History  Administered Date(s) Administered  . Influenza, High Dose Seasonal PF 09/17/2019  . Influenza-Unspecified 09/16/2018, 09/07/2020  . Moderna  SARS-COV2 Booster Vaccination 10/18/2020  . Moderna Sars-Covid-2 Vaccination 12/08/2019, 01/05/2020  . Pneumococcal Conjugate-13 12/29/2013  . Pneumococcal Polysaccharide-23 09/08/2005  . Tdap 05/25/2005, 08/08/2018  . Zoster Recombinat (Shingrix) 10/17/2017, 01/25/2018   Pertinent  Health Maintenance Due  Topic Date Due  . OPHTHALMOLOGY EXAM  Never done  . URINE MICROALBUMIN  Never done  . FOOT EXAM  09/28/2019  . INFLUENZA VACCINE  07/04/2021  . PNA vac Low Risk Adult  Completed  . HEMOGLOBIN A1C  Discontinued   Fall Risk  01/01/2019 09/24/2018 01/04/2017 10/06/2016 09/29/2016  Falls in the past year? 0 No No No No   Functional Status Survey:    Vitals:   05/06/21 0923  BP: 120/64  Pulse: 60  Resp: 20  Temp: 97.9 F (36.6 C)  SpO2: 94%  Weight: 145 lb 6.4 oz (66 kg)  Height: 5\' 6"  (1.676 m)   Body mass index is 23.47 kg/m. Physical Exam Vitals and nursing note reviewed.  Constitutional:      General: He is not in acute distress.    Appearance: He is not ill-appearing.     Comments: Feel tired.   HENT:     Head: Normocephalic and atraumatic.     Nose: Nose normal.     Mouth/Throat:     Mouth: Mucous membranes are dry.  Eyes:     Extraocular Movements: Extraocular movements intact.     Conjunctiva/sclera: Conjunctivae normal.     Pupils: Pupils are equal, round, and reactive to light.  Cardiovascular:     Rate and Rhythm: Normal rate and regular rhythm.     Heart sounds: Murmur heard.      Comments: Weak DP pulse on the right Pulmonary:     Effort: Pulmonary effort is normal.     Breath sounds: Rales present.     Comments: Posterior left basilar rales.  Abdominal:     General: Bowel sounds are  normal.     Palpations: Abdomen is soft.     Tenderness: There is no abdominal tenderness.  Genitourinary:    Comments: Foley  Musculoskeletal:        General: Tenderness present.     Cervical back: Normal range of motion and neck supple.     Right lower leg: No edema.     Left lower leg: No edema.     Comments: S/p the right great and 4th toe amputation. R 2nd 3rd hammer toes pain with walking, L 4th, 5th toes pain when touched, right lower back to the right leg pain when sitting in w/c for a long period of time. Left foot flexion contracture.   Skin:    General: Skin is warm and dry.  Neurological:     General: No focal deficit present.     Mental Status: He is alert. Mental status is at baseline.     Gait: Gait abnormal.     Comments: Oriented to person, place.   Psychiatric:        Mood and Affect: Mood normal.        Behavior: Behavior normal.        Thought Content: Thought content normal.        Judgment: Judgment normal.     Labs reviewed: Recent Labs    12/30/20 0000 02/11/21 0000 02/14/21 0000  NA 139 137 139  K 4.3 4.3 4.2  CL 110* 106 107  CO2 20 23* 24*  BUN 21 21 16   CREATININE 0.7 0.9 0.8  CALCIUM 8.7 8.7  9.0   Recent Labs    12/30/20 0000 02/11/21 0000 02/14/21 0000  AST 30 29 39  ALT 10 9* 15  ALKPHOS 91 91 89  ALBUMIN 3.4* 3.6 3.4*   Recent Labs    12/30/20 0000 02/11/21 0000 02/14/21 0000  WBC 8.1 12.4 7.9  NEUTROABS 5,775.00 10,639.00 6,012.00  HGB 12.0* 12.7* 12.9*  HCT 36* 38* 40*  PLT 260 260 264   Lab Results  Component Value Date   TSH 2.68 12/10/2020   Lab Results  Component Value Date   HGBA1C 5.2 08/29/2018   Lab Results  Component Value Date   CHOL 158 08/29/2018   HDL 34 (A) 05/06/2020   LDLCALC 103 05/06/2020   TRIG 154 05/06/2020    Significant Diagnostic Results in last 30 days:  No results found.  Assessment/Plan Adult failure to thrive Adult failure to thrive, gradual weight loss, about #8 Ibs in the  past month, regardless Mirtazapine.   Hyperlipidemia Continue Mevacor.   Constipation Prn MiraLax is adequate.   Flexion contracture of joint of foot, left Left foot flexion contracture, f/u podiatry, Ortho.  Urinary retention Foley chronic, f/u Urology   Depression, recurrent ([place]) His mood is stable, on Wellbutrin, Mirtazapine, TSH 2.68 12/09/20   Neuropathy Neuropathic pain in BLE,takesGabapentin 300mg  bid, Tylenol 500mg  tid, Norco q6hr prn.c/o R 2nd 3rd hammer toes pain with walking, L 4th, 5th toes pain when touched, right lower back to the right leg pain when sitting in w/c for a long period of time  GERD  stable, on Pantoprazole 40mg  qd.Hgb 12.9 02/14/21   DYSPHAGIA Honey thick liquids.   Essential tremor stable, on Propranolol 10mg  qd.Bun/creat16/0.8 02/14/21   REM sleep behavior disorder Hx ofRLE/REM, stable, on Requip 2mg  qd.Sleep study 10/25/20  OSA on CPAP saw Neurology 11/17/20, uses CPAP     Family/ staff Communication: plan of care reviewed with the patient and charge nurse.   Labs/tests ordered:  none  Time spend 35 minutes.

## 2021-05-06 NOTE — Assessment & Plan Note (Signed)
stable, on Pantoprazole 40mg  qd.Hgb 12.9 02/14/21

## 2021-05-06 NOTE — Assessment & Plan Note (Signed)
Neuropathic pain in BLE,takesGabapentin 300mg  bid, Tylenol 500mg  tid, Norco q6hr prn.c/o R 2nd 3rd hammer toes pain with walking, L 4th, 5th toes pain when touched, right lower back to the right leg pain when sitting in w/c for a long period of time

## 2021-05-06 NOTE — Assessment & Plan Note (Signed)
stable, on Propranolol 10mg  qd.Bun/creat16/0.8 02/14/21

## 2021-05-06 NOTE — Assessment & Plan Note (Signed)
Honey thick liquids.

## 2021-05-06 NOTE — Assessment & Plan Note (Signed)
Foley chronic, f/u Urology

## 2021-05-06 NOTE — Assessment & Plan Note (Signed)
Hx ofRLE/REM, stable, on Requip 2mg  qd.Sleep study 10/25/20

## 2021-05-06 NOTE — Assessment & Plan Note (Signed)
Adult failure to thrive, gradual weight loss, about #8 Ibs in the past month, regardless Mirtazapine.

## 2021-05-09 DIAGNOSIS — A0472 Enterocolitis due to Clostridium difficile, not specified as recurrent: Secondary | ICD-10-CM | POA: Diagnosis not present

## 2021-05-10 DIAGNOSIS — A0472 Enterocolitis due to Clostridium difficile, not specified as recurrent: Secondary | ICD-10-CM | POA: Diagnosis not present

## 2021-05-11 DIAGNOSIS — Z23 Encounter for immunization: Secondary | ICD-10-CM | POA: Diagnosis not present

## 2021-05-11 DIAGNOSIS — A0472 Enterocolitis due to Clostridium difficile, not specified as recurrent: Secondary | ICD-10-CM | POA: Diagnosis not present

## 2021-05-12 DIAGNOSIS — A0472 Enterocolitis due to Clostridium difficile, not specified as recurrent: Secondary | ICD-10-CM | POA: Diagnosis not present

## 2021-05-13 ENCOUNTER — Encounter: Payer: Self-pay | Admitting: Orthopedic Surgery

## 2021-05-13 ENCOUNTER — Non-Acute Institutional Stay (SKILLED_NURSING_FACILITY): Payer: Medicare Other | Admitting: Orthopedic Surgery

## 2021-05-13 DIAGNOSIS — M24575 Contracture, left foot: Secondary | ICD-10-CM

## 2021-05-13 DIAGNOSIS — R627 Adult failure to thrive: Secondary | ICD-10-CM | POA: Diagnosis not present

## 2021-05-13 DIAGNOSIS — R1319 Other dysphagia: Secondary | ICD-10-CM

## 2021-05-13 DIAGNOSIS — R339 Retention of urine, unspecified: Secondary | ICD-10-CM | POA: Diagnosis not present

## 2021-05-13 DIAGNOSIS — G629 Polyneuropathy, unspecified: Secondary | ICD-10-CM

## 2021-05-13 DIAGNOSIS — R400 Somnolence: Secondary | ICD-10-CM

## 2021-05-13 DIAGNOSIS — B351 Tinea unguium: Secondary | ICD-10-CM | POA: Diagnosis not present

## 2021-05-13 DIAGNOSIS — G250 Essential tremor: Secondary | ICD-10-CM

## 2021-05-13 DIAGNOSIS — A0472 Enterocolitis due to Clostridium difficile, not specified as recurrent: Secondary | ICD-10-CM | POA: Diagnosis not present

## 2021-05-13 DIAGNOSIS — M79671 Pain in right foot: Secondary | ICD-10-CM | POA: Diagnosis not present

## 2021-05-13 DIAGNOSIS — Z79899 Other long term (current) drug therapy: Secondary | ICD-10-CM

## 2021-05-13 DIAGNOSIS — G2581 Restless legs syndrome: Secondary | ICD-10-CM

## 2021-05-13 DIAGNOSIS — E785 Hyperlipidemia, unspecified: Secondary | ICD-10-CM | POA: Diagnosis not present

## 2021-05-13 DIAGNOSIS — K219 Gastro-esophageal reflux disease without esophagitis: Secondary | ICD-10-CM

## 2021-05-13 DIAGNOSIS — K5900 Constipation, unspecified: Secondary | ICD-10-CM

## 2021-05-13 DIAGNOSIS — M79672 Pain in left foot: Secondary | ICD-10-CM | POA: Diagnosis not present

## 2021-05-13 DIAGNOSIS — F339 Major depressive disorder, recurrent, unspecified: Secondary | ICD-10-CM | POA: Diagnosis not present

## 2021-05-13 MED ORDER — GABAPENTIN 100 MG PO CAPS
200.0000 mg | ORAL_CAPSULE | Freq: Two times a day (BID) | ORAL | 3 refills | Status: AC
Start: 2021-05-13 — End: ?

## 2021-05-13 MED ORDER — ROPINIROLE HCL 1 MG PO TABS: 1.0000 mg | ORAL_TABLET | Freq: Every day | ORAL | 0 refills | Status: DC

## 2021-05-13 NOTE — Progress Notes (Signed)
Location:   [facility] Room Number: 26 Place of Service:  SNF ([phone]) Provider:  Windell Moulding, NP  [name], MD  Patient Care Team: [name], MD as PCP - General (Internal Medicine) [name], MD (Hematology and Oncology) [name], MD as Attending Physician (Urology) [name], MD (Gastroenterology)  Extended Emergency Contact Information Primary Emergency Contact: Nazareth, Kirk Mobile Phone: [phone] Relation: Son Preferred language: English Interpreter needed? No Secondary Emergency Contact: Hollenbach,Janet H Address: [address]          [address]          [place], [place] [address] [name] of [place] Phone: [phone] Mobile Phone: [phone] Relation: Spouse  Code Status:  DNR Goals of care: Advanced Directive information Advanced Directives 05/13/2021  Does Patient Have a Medical Advance Directive? Yes  Type of Paramedic of [place];Living will;Out of facility DNR (pink MOST or yellow form)  Does patient want to make changes to medical advance directive? No - Patient declined  Copy of [place] in Chart? Yes - validated most recent copy scanned in chart (See row information)  Would patient like information on creating a medical advance directive? -  Pre-existing out of facility DNR order (yellow form or pink MOST form) Yellow form placed in chart (order not valid for inpatient use);Pink MOST form placed in chart (order not valid for inpatient use)     Chief Complaint  Patient presents with   Acute Visit    Medication review    HPI:  Pt is a 87 y.o. male seen today for an acute visit for medication review.   Wife present during encounter.   He currently resides in the skilled nursing unit at [facility]. Past medical history includes: OSA, neuropathy, REM sleep behavior disorder, essential tremor, GERD, depression, constipation,  hyperlipidemia, left foot contracture, and adult failure to thrive.   Gershon Mussel and his wife are concerned about his increased drowsiness over the past few months. Tom states " I feel foggy all the time." Wife concerned he is taking too much medication that may be contributing to current condition. In addition, he has had progressive weight loss in the past 3 months, down 15 lbs. He is often observed skipping meals due to sleep. Wife reports he attends the dining hall for meals less and does not visit [place] for fresh air. They are asking for medication review today in an effort to improve drowsiness.   OSA, uses CPAP every night, wife concerned they are putting mask on wrong, he does not use during naps during the day, does not know when tubing was last cleaned. Neuropathy, gabapentin 300 mg bid, tylenol 500 mg tid, norco 5/325 q6 prn, reports some hip pain with neuropathy, hx left foot contracture, followed by podiatry and ortho REM sleep behavior disorder, Requip 2 mg qhs Essential tremor, propranolol 10 mg daily, BUN/creat 16/0.8 02/14/2021 GERD, stable with Protonix 40 mg daily Depression, Wellbutrin 200 mg bid, Remeron 15 mg qhs Constipation, daily bowel movements, miralax daily Hyperlipidemia, lovastatin daily Adult failure to thrive, down 15 lbs since March 2022  No recent falls, injuries or behavioral outbursts.   Recent weights:  06/01- 145.4 lbs  05/04- 153.7 lbs  04/01- 157.6 lbs  03/01- 160 lbs  Nurse does not report any concerns, vitals stable.    Past Medical History:  Diagnosis Date   Anemia    Arthritis    AVM (  arteriovenous malformation)    Barrett's esophagus    BPH (benign prostatic hyperplasia)    Cataract    Constipation    CPAP (continuous positive airway pressure) dependence    Depression    Diverticulosis    diverticular bleeding   Diverticulosis    GERD (gastroesophageal reflux disease)    History of radiation therapy    HOH (hard of hearing)    wears  hearing aids   Hyperlipidemia    Hypoglycemia    Lymphocytosis 06/2012   Mastodynia    Murmur    slight   Nasal congestion    Osteoporosis    Osteoporosis    Prostate cancer ([place])    Sleep apnea    wears CPAP   Urinary retention with incomplete bladder emptying    pt uses bathroom and within 30 mins needs to go again   Vitamin D deficiency    Past Surgical History:  Procedure Laterality Date   AMPUTATION TOE Right 09/20/2017   Procedure: Right 4th toe amputation;  Surgeon: Wylene Simmer, MD;  Location: [facility];  Service: Orthopedics;  Laterality: Right;  foot block   AMPUTATION TOE Right 02/05/2019   Procedure: RIGHT GREAT TOE AMPUTATION;  Surgeon: Leandrew Koyanagi, MD;  Location: [facility];  Service: Orthopedics;  Laterality: Right;   BACK SURGERY  04/2013   back surgury     f   BLEPHAROPLASTY Bilateral 02/23/15   CATARACT EXTRACTION     COLONOSCOPY     COLONOSCOPY N/A 10/02/2017   Procedure: COLONOSCOPY;  Surgeon: Doran Stabler, MD;  Location: [facility];  Service: Gastroenterology;  Laterality: N/A;   COLONOSCOPY WITH PROPOFOL N/A 10/01/2017   Procedure: COLONOSCOPY WITH PROPOFOL;  Surgeon: Doran Stabler, MD;  Location: [facility];  Service: Gastroenterology;  Laterality: N/A;   COLONOSCOPY WITH PROPOFOL N/A 03/07/2018   Procedure: COLONOSCOPY WITH PROPOFOL;  Surgeon: [name], MD;  Location: [facility];  Service: Endoscopy;  Laterality: N/A;   ESOPHAGOGASTRODUODENOSCOPY (EGD) WITH PROPOFOL N/A 10/01/2017   Procedure: ESOPHAGOGASTRODUODENOSCOPY (EGD) WITH PROPOFOL;  Surgeon: Doran Stabler, MD;  Location: [facility];  Service: Gastroenterology;  Laterality: N/A;   EYE SURGERY Bilateral    FINGER SURGERY Right    right thumb   HARDWARE REMOVAL Right 05/15/2016   Procedure: Right Lumbar Two-Lumbar Five Removal of Hardware;  Surgeon: Kristeen Miss, MD;  Location: [facility];  Service: Neurosurgery;  Laterality: Right;   Right L2-5 removal of hardware   INGUINAL HERNIA REPAIR     x4   IR RADIOLOGIST EVAL & MGMT  07/10/2018   IR RADIOLOGIST EVAL & MGMT  09/05/2018   IR SACROPLASTY BILATERAL  07/15/2018   REPAIR SPIGELIAN HERNIA  2011   SPINAL FUSION  1996 ,2014   TONSILLECTOMY     UPPER GASTROINTESTINAL ENDOSCOPY     VASECTOMY      Allergies  Allergen Reactions   Celebrex [Celecoxib] Other (See Comments)    Created stomach ulcers.   Flexeril [Cyclobenzaprine] Other (See Comments)    Caught in hiatal hernia and caused extreme pain    Allergies as of 05/13/2021       Reactions   Celebrex [celecoxib] Other (See Comments)   Created stomach ulcers.   Flexeril [cyclobenzaprine] Other (See Comments)   Caught in hiatal hernia and caused extreme pain        Medication List        Accurate as of May 13, 2021  9:58 AM. If you have any questions, ask your nurse or doctor.          acetaminophen 500 MG tablet Commonly known as: TYLENOL Take 500 mg by mouth 3 (three) times daily.   buPROPion 150 MG 12 hr tablet Commonly known as: WELLBUTRIN SR Take 200 mg by mouth 2 (two) times daily.   carbamide peroxide 6.5 % OTIC solution Commonly known as: DEBROX Place 5-10 drops into both ears every 30 (thirty) days. Flush ears every 3 months due to excessive cercumen build up Once A Day on the 15th of Every 3rd Month   carboxymethylcellul-glycerin 0.5-0.9 % ophthalmic solution Commonly known as: REFRESH OPTIVE Place 1 drop into both eyes 3 (three) times daily.   CENTRUM ADULTS PO Take 1 tablet by mouth daily.   PRESERVISION AREDS 2 PO Take 1 capsule by mouth 2 (two) times daily.   chlorhexidine 0.12 % solution Commonly known as: PERIDEX Use as directed 15 mLs in the mouth or throat at bedtime.   cholecalciferol 1000 units tablet Commonly known as: VITAMIN D Take 1,000 Units by mouth daily.   cyanocobalamin 1000 MCG tablet Take 1,000 mcg by mouth daily.   dextromethorphan-guaiFENesin  30-600 MG 12hr tablet Commonly known as: MUCINEX DM Take 1 tablet by mouth 2 (two) times daily as needed.   diclofenac Sodium 1 % Gel Commonly known as: VOLTAREN Apply 2 g topically 4 (four) times daily.   gabapentin 300 MG capsule Commonly known as: NEURONTIN Take 300 mg by mouth 2 (two) times daily. due to numbness and tingling   HYDROcodone-acetaminophen 5-325 MG tablet Commonly known as: NORCO/VICODIN Take one tablet by mouth every 6 hours as needed for moderate pain. Do not exceed 4gm of Tylenol in 24 hours.   lactase 3000 units tablet Commonly known as: LACTAID Take 4,500 Units by mouth 3 (three) times daily with meals.   loratadine 10 MG tablet Commonly known as: CLARITIN Take 10 mg by mouth daily.   lovastatin 20 MG tablet Commonly known as: MEVACOR Take 20 mg by mouth at bedtime.   mirtazapine 7.5 MG tablet Commonly known as: REMERON Take 15 mg by mouth at bedtime.   pantoprazole 40 MG tablet Commonly known as: PROTONIX Take 40 mg by mouth every morning.   polyethylene glycol 17 g packet Commonly known as: MIRALAX / GLYCOLAX Take 17 g by mouth daily as needed.   propranolol 10 MG tablet Commonly known as: INDERAL Take 10 mg by mouth daily.   rOPINIRole 1 MG tablet Commonly known as: REQUIP TAKE 2 TABLETS(2 MG) BY MOUTH AT BEDTIME   senna 8.6 MG tablet Commonly known as: SENOKOT Take 1 tablet by mouth daily.   zinc oxide 20 % ointment Apply 1 application topically as needed for irritation.        Review of Systems  Constitutional:  Positive for fatigue and unexpected weight change. Negative for activity change, appetite change and fever.  HENT:  Positive for hearing loss and trouble swallowing. Negative for congestion and dental problem.   Eyes:  Negative for visual disturbance.  Respiratory:  Negative for cough, shortness of breath and wheezing.   Cardiovascular:  Negative for chest pain and leg swelling.  Gastrointestinal:  Positive for  constipation. Negative for abdominal distention, abdominal pain, diarrhea and nausea.  Genitourinary:  Negative for dysuria, frequency and hematuria.  Musculoskeletal:  Positive for arthralgias, gait problem, joint swelling and myalgias.       Left foot drop  Skin: Negative.  Neurological:  Positive for tremors, weakness and numbness. Negative for dizziness and headaches.  Psychiatric/Behavioral:  Positive for dysphoric mood and sleep disturbance. Negative for confusion. The patient is not nervous/anxious.    Immunization History  Administered Date(s) Administered   Influenza, High Dose Seasonal PF 09/17/2019   Influenza-Unspecified 09/16/2018, 09/07/2020   Moderna SARS-COV2 Booster Vaccination 10/18/2020, 05/11/2021   Moderna Sars-Covid-2 Vaccination 12/08/2019, 01/05/2020   Pneumococcal Conjugate-13 12/29/2013   Pneumococcal Polysaccharide-23 09/08/2005   Tdap 05/25/2005, 08/08/2018   Zoster Recombinat (Shingrix) 10/17/2017, 01/25/2018   Pertinent  Health Maintenance Due  Topic Date Due   OPHTHALMOLOGY EXAM  Never done   URINE MICROALBUMIN  Never done   FOOT EXAM  09/28/2019   INFLUENZA VACCINE  07/04/2021   PNA vac Low Risk Adult  Completed   HEMOGLOBIN A1C  Discontinued   Fall Risk  01/01/2019 09/24/2018 01/04/2017 10/06/2016 09/29/2016  Falls in the past year? 0 No No No No   Functional Status Survey:    Vitals:   05/13/21 0938  BP: 123/80  Pulse: 73  Temp: 97.7 F (36.5 C)  SpO2: 93%  Weight: 145 lb 6.4 oz (66 kg)  Height: 5\' 6"  (1.676 m)   Body mass index is 23.47 kg/m. Physical Exam Vitals reviewed.  Constitutional:      General: He is not in acute distress. HENT:     Head: Normocephalic.     Right Ear: There is no impacted cerumen.     Left Ear: There is no impacted cerumen.     Nose: Nose normal.     Mouth/Throat:     Mouth: Mucous membranes are moist.  Eyes:     General:        Right eye: No discharge.        Left eye: No discharge.   Cardiovascular:     Rate and Rhythm: Normal rate and regular rhythm.     Pulses: Normal pulses.     Heart sounds: Normal heart sounds. No murmur heard. Pulmonary:     Effort: Pulmonary effort is normal. No respiratory distress.     Breath sounds: Normal breath sounds. No wheezing.  Abdominal:     General: Bowel sounds are normal. There is no distension.     Palpations: Abdomen is soft.     Tenderness: There is no abdominal tenderness.  Genitourinary:    Comments: Indwelling foley Musculoskeletal:     Cervical back: Normal range of motion.     Right lower leg: No edema.     Left lower leg: No edema.  Lymphadenopathy:     Cervical: No cervical adenopathy.  Skin:    General: Skin is warm and dry.     Capillary Refill: Capillary refill takes less than 2 seconds.  Neurological:     General: No focal deficit present.     Mental Status: He is alert and oriented to person, place, and time.     Motor: Weakness present.     Gait: Gait abnormal.     Comments: wheelchair  Psychiatric:        Mood and Affect: Mood normal.        Behavior: Behavior normal.    Labs reviewed: Recent Labs    12/30/20 0000 02/11/21 0000 02/14/21 0000  NA 139 137 139  K 4.3 4.3 4.2  CL 110* 106 107  CO2 20 23* 24*  BUN 21 21 16   CREATININE 0.7 0.9 0.8  CALCIUM 8.7 8.7 9.0   Recent Labs  12/30/20 0000 02/11/21 0000 02/14/21 0000  AST 30 29 39  ALT 10 9* 15  ALKPHOS 91 91 89  ALBUMIN 3.4* 3.6 3.4*   Recent Labs    12/30/20 0000 02/11/21 0000 02/14/21 0000  WBC 8.1 12.4 7.9  NEUTROABS 5,775.00 10,639.00 6,012.00  HGB 12.0* 12.7* 12.9*  HCT 36* 38* 40*  PLT 260 260 264   Lab Results  Component Value Date   TSH 2.68 12/10/2020   Lab Results  Component Value Date   HGBA1C 5.2 08/29/2018   Lab Results  Component Value Date   CHOL 158 08/29/2018   HDL 34 (A) 05/06/2020   LDLCALC 103 05/06/2020   TRIG 154 05/06/2020    Significant Diagnostic Results in last 30 days:  No  results found.  Assessment/Plan 1. Encounter for medication review - per request of wife due to increased drowsiness and weight loss  2. Adult failure to thrive - down 15 lbs since March 2022  3. Drowsiness - reports feeling "foggy" - medication review -d/c claritin - reduce Requip to 1 mg qhs - cbc/diff - cmp  4. Hyperlipidemia, unspecified hyperlipidemia type - cont lovastatin  5. Constipation, unspecified constipation type - regular BM, abdomen soft - cont miralax daily  6. Flexion contracture of joint of foot, left - followed by podiatry and ortho  7. Urinary retention - chronic indwelling foley - urology f/u 06/10/2021 - cont monthly care  - encourage hydration  8. Depression, recurrent ([place]) - denies increased depression - cont wellbutrin 200 mg bid - no improvement with remeron - d/c remeron  9. Gastroesophageal reflux disease, unspecified whether esophagitis present - stable with protonix 40 mg daily - cont to avoid food triggers  10. DYSPHAGIA - no recent aspirations - cont mechanical soft foods with nectar thick liquids  11. Neuropathy - stable, willing to decrease medications to see if he will be more alert - he understands reducing meds may increase symptoms - decrease gabapentin to 200 mg po bid - decrease norco 5/325 mg - take 1/2 tablet q 6hrs prn  12. Essential tremor - propranolol 10 mg daily    Family/ staff Communication:   Labs/tests ordered:

## 2021-05-16 DIAGNOSIS — R6889 Other general symptoms and signs: Secondary | ICD-10-CM | POA: Diagnosis not present

## 2021-05-16 DIAGNOSIS — R7989 Other specified abnormal findings of blood chemistry: Secondary | ICD-10-CM | POA: Diagnosis not present

## 2021-05-16 DIAGNOSIS — A0472 Enterocolitis due to Clostridium difficile, not specified as recurrent: Secondary | ICD-10-CM | POA: Diagnosis not present

## 2021-05-16 LAB — CBC AND DIFFERENTIAL
HCT: 36 — AB (ref 41–53)
Hemoglobin: 11.8 — AB (ref 13.5–17.5)
Neutrophils Absolute: 5460
Platelets: 258 (ref 150–399)
WBC: 7.5

## 2021-05-16 LAB — COMPREHENSIVE METABOLIC PANEL
Albumin: 3.4 — AB (ref 3.5–5.0)
Calcium: 8.8 (ref 8.7–10.7)
Globulin: 3.7

## 2021-05-16 LAB — BASIC METABOLIC PANEL
BUN: 17 (ref 4–21)
CO2: 24 — AB (ref 13–22)
Chloride: 106 (ref 99–108)
Creatinine: 0.8 (ref 0.6–1.3)
Glucose: 72
Potassium: 4 (ref 3.4–5.3)
Sodium: 138 (ref 137–147)

## 2021-05-16 LAB — HEPATIC FUNCTION PANEL
ALT: 13 (ref 10–40)
AST: 32 (ref 14–40)
Alkaline Phosphatase: 93 (ref 25–125)
Bilirubin, Total: 0.4

## 2021-05-16 LAB — CBC: RBC: 4.09 (ref 3.87–5.11)

## 2021-05-17 DIAGNOSIS — A0472 Enterocolitis due to Clostridium difficile, not specified as recurrent: Secondary | ICD-10-CM | POA: Diagnosis not present

## 2021-05-18 DIAGNOSIS — A0472 Enterocolitis due to Clostridium difficile, not specified as recurrent: Secondary | ICD-10-CM | POA: Diagnosis not present

## 2021-05-19 DIAGNOSIS — A0472 Enterocolitis due to Clostridium difficile, not specified as recurrent: Secondary | ICD-10-CM | POA: Diagnosis not present

## 2021-05-20 DIAGNOSIS — A0472 Enterocolitis due to Clostridium difficile, not specified as recurrent: Secondary | ICD-10-CM | POA: Diagnosis not present

## 2021-05-23 DIAGNOSIS — A0472 Enterocolitis due to Clostridium difficile, not specified as recurrent: Secondary | ICD-10-CM | POA: Diagnosis not present

## 2021-05-24 DIAGNOSIS — A0472 Enterocolitis due to Clostridium difficile, not specified as recurrent: Secondary | ICD-10-CM | POA: Diagnosis not present

## 2021-05-27 DIAGNOSIS — A0472 Enterocolitis due to Clostridium difficile, not specified as recurrent: Secondary | ICD-10-CM | POA: Diagnosis not present

## 2021-05-28 DIAGNOSIS — A0472 Enterocolitis due to Clostridium difficile, not specified as recurrent: Secondary | ICD-10-CM | POA: Diagnosis not present

## 2021-05-30 DIAGNOSIS — A0472 Enterocolitis due to Clostridium difficile, not specified as recurrent: Secondary | ICD-10-CM | POA: Diagnosis not present

## 2021-05-31 DIAGNOSIS — A0472 Enterocolitis due to Clostridium difficile, not specified as recurrent: Secondary | ICD-10-CM | POA: Diagnosis not present

## 2021-06-01 DIAGNOSIS — A0472 Enterocolitis due to Clostridium difficile, not specified as recurrent: Secondary | ICD-10-CM | POA: Diagnosis not present

## 2021-06-02 DIAGNOSIS — A0472 Enterocolitis due to Clostridium difficile, not specified as recurrent: Secondary | ICD-10-CM | POA: Diagnosis not present

## 2021-06-03 DIAGNOSIS — M25551 Pain in right hip: Secondary | ICD-10-CM | POA: Diagnosis not present

## 2021-06-03 DIAGNOSIS — R2689 Other abnormalities of gait and mobility: Secondary | ICD-10-CM | POA: Diagnosis not present

## 2021-06-03 DIAGNOSIS — R1312 Dysphagia, oropharyngeal phase: Secondary | ICD-10-CM | POA: Diagnosis not present

## 2021-06-03 DIAGNOSIS — Z960 Presence of urogenital implants: Secondary | ICD-10-CM | POA: Diagnosis not present

## 2021-06-03 DIAGNOSIS — R293 Abnormal posture: Secondary | ICD-10-CM | POA: Diagnosis not present

## 2021-06-03 DIAGNOSIS — M6281 Muscle weakness (generalized): Secondary | ICD-10-CM | POA: Diagnosis not present

## 2021-06-03 DIAGNOSIS — R531 Weakness: Secondary | ICD-10-CM | POA: Diagnosis not present

## 2021-06-07 ENCOUNTER — Non-Acute Institutional Stay (SKILLED_NURSING_FACILITY): Payer: Medicare Other | Admitting: Nurse Practitioner

## 2021-06-07 ENCOUNTER — Encounter: Payer: Self-pay | Admitting: Nurse Practitioner

## 2021-06-07 DIAGNOSIS — N390 Urinary tract infection, site not specified: Secondary | ICD-10-CM

## 2021-06-07 DIAGNOSIS — G250 Essential tremor: Secondary | ICD-10-CM

## 2021-06-07 DIAGNOSIS — R339 Retention of urine, unspecified: Secondary | ICD-10-CM | POA: Diagnosis not present

## 2021-06-07 DIAGNOSIS — R319 Hematuria, unspecified: Secondary | ICD-10-CM | POA: Diagnosis not present

## 2021-06-07 DIAGNOSIS — R634 Abnormal weight loss: Secondary | ICD-10-CM | POA: Diagnosis not present

## 2021-06-07 DIAGNOSIS — K219 Gastro-esophageal reflux disease without esophagitis: Secondary | ICD-10-CM

## 2021-06-07 DIAGNOSIS — M24575 Contracture, left foot: Secondary | ICD-10-CM

## 2021-06-07 DIAGNOSIS — F339 Major depressive disorder, recurrent, unspecified: Secondary | ICD-10-CM

## 2021-06-07 DIAGNOSIS — G629 Polyneuropathy, unspecified: Secondary | ICD-10-CM | POA: Diagnosis not present

## 2021-06-07 DIAGNOSIS — G4752 REM sleep behavior disorder: Secondary | ICD-10-CM | POA: Diagnosis not present

## 2021-06-07 NOTE — Assessment & Plan Note (Signed)
Neuropathic pain in BLE, takes Gabapentin 300mg  bid,  Tylenol 500mg  tid, Norco q6hr prn. c/o R 2nd 3rd hammer toes pain with walking, L 4th, 5th toes pain when touched, right lower back to the right leg pain when sitting in w/c for a long period of time.

## 2021-06-07 NOTE — Assessment & Plan Note (Signed)
Adult failure to thrive, weight loss #5Ibs in the past month, #13Ibs in the past 2 months. May consider trying Mirtazapine again.

## 2021-06-07 NOTE — Assessment & Plan Note (Signed)
Left foot flexion contracture, f/u podiatry, Ortho.

## 2021-06-07 NOTE — Assessment & Plan Note (Signed)
Essential tremor, stable, on Propranolol 10mg  qd. Bun/creat 17/0.8 05/16/21

## 2021-06-07 NOTE — Assessment & Plan Note (Signed)
f/u Urology. Blood in urine, denied abd pain, staff called, reported urine is clear after Foley was changed. UA C/S ordered.

## 2021-06-07 NOTE — Assessment & Plan Note (Signed)
GERD, stable, on Pantoprazole 40mg  qd. Hgb 11.8 05/16/21

## 2021-06-07 NOTE — Progress Notes (Addendum)
Location:   [facility] Room Number: 26 Place of Service:  SNF ([phone]) Provider:  Marda Stalker, Lennie Odor NP  [name], MD  Patient Care Team: [name], MD as PCP - General (Internal Medicine) [name], MD (Hematology and Oncology) [name], MD as Attending Physician (Urology) [name], MD (Gastroenterology)  Extended Emergency Contact Information Primary Emergency Contact: Herchel, Hopkin Mobile Phone: [phone] Relation: Son Preferred language: English Interpreter needed? No Secondary Emergency Contact: Arshad,Janet H Address: [address]          [address]          [place], [place] [address] [name] of [place] Phone: [phone] Mobile Phone: [phone] Relation: Spouse  Code Status:  DNR Goals of care: Advanced Directive information Advanced Directives 06/10/2021  Does Patient Have a Medical Advance Directive? Yes  Type of Paramedic of [place];Living will;Out of facility DNR (pink MOST or yellow form)  Does patient want to make changes to medical advance directive? No - Patient declined  Copy of [place] in Chart? Yes - validated most recent copy scanned in chart (See row information)  Would patient like information on creating a medical advance directive? -  Pre-existing out of facility DNR order (yellow form or pink MOST form) Yellow form placed in chart (order not valid for inpatient use);Pink MOST form placed in chart (order not valid for inpatient use)     Chief Complaint  Patient presents with  . Acute Visit    Blood in urine    HPI:  Pt is a 87 y.o. male seen today for an acute visit for c/o blood in urine, dark appearance in the foley collection bad, no pain in lower abd or urethra opening.    OSA, saw Neurology 11/17/20, uses CPAP             Hx of RLE/REM, stable, on Requip 34m qd. Sleep study 10/25/20             Essential tremor, stable, on  Propranolol 179mqd. Bun/creat 17/0.8 05/16/21             GERD, stable, on Pantoprazole 4028md. Hgb 11.8 05/16/21             Neuropathic pain in BLE, takes Gabapentin 300m54md,  Tylenol 500mg26m, Norco q6hr prn. c/o R 2nd 3rd hammer toes pain with walking, L 4th, 5th toes pain when touched, right lower back to the right leg pain when sitting in w/c for a long period of time.              His mood is stable, on Wellbutrin, Mirtazapine, TSH 2.68 12/09/20             Foley chronic, f/u Urology              Left foot flexion contracture, f/u podiatry, Ortho.                     Constipation, prn MiraLax.             Hyperlipidemia, takes Mevacor             Adult failure to thrive, weight loss #5Ibs in the past month, #13Ibs in the past 2 months. May consider trying Mirtazapine again.  Past Medical History:  Diagnosis Date  . Anemia   . Arthritis   . AVM (arteriovenous malformation)   . Barrett's esophagus   . BPH (benign prostatic hyperplasia)   . Cataract   . Constipation   . CPAP (continuous positive airway pressure) dependence   . Depression   . Diverticulosis    diverticular bleeding  . Diverticulosis   . GERD (gastroesophageal reflux disease)   . History of radiation therapy   . HOH (hard of hearing)    wears hearing aids  . Hyperlipidemia   . Hypoglycemia   . Lymphocytosis 06/2012  . Mastodynia   . Murmur    slight  . Nasal congestion   . Osteoporosis   . Osteoporosis   . Prostate cancer ([place])   . Sleep apnea    wears CPAP  . Urinary retention with incomplete bladder emptying    pt uses bathroom and within 30 mins needs to go again  . Vitamin D deficiency    Past Surgical History:  Procedure Laterality Date  . AMPUTATION TOE Right 09/20/2017   Procedure: Right 4th toe amputation;  Surgeon: Wylene Simmer, MD;  Location: [facility];  Service: Orthopedics;  Laterality: Right;  foot block  . AMPUTATION TOE Right 02/05/2019    Procedure: RIGHT GREAT TOE AMPUTATION;  Surgeon: Leandrew Koyanagi, MD;  Location: [facility];  Service: Orthopedics;  Laterality: Right;  . BACK SURGERY  04/2013  . back surgury     f  . BLEPHAROPLASTY Bilateral 02/23/15  . CATARACT EXTRACTION    . COLONOSCOPY    . COLONOSCOPY N/A 10/02/2017   Procedure: COLONOSCOPY;  Surgeon: Doran Stabler, MD;  Location: [facility];  Service: Gastroenterology;  Laterality: N/A;  . COLONOSCOPY WITH PROPOFOL N/A 10/01/2017   Procedure: COLONOSCOPY WITH PROPOFOL;  Surgeon: Doran Stabler, MD;  Location: [facility];  Service: Gastroenterology;  Laterality: N/A;  . COLONOSCOPY WITH PROPOFOL N/A 03/07/2018   Procedure: COLONOSCOPY WITH PROPOFOL;  Surgeon: [name], MD;  Location: [facility];  Service: Endoscopy;  Laterality: N/A;  . ESOPHAGOGASTRODUODENOSCOPY (EGD) WITH PROPOFOL N/A 10/01/2017   Procedure: ESOPHAGOGASTRODUODENOSCOPY (EGD) WITH PROPOFOL;  Surgeon: Doran Stabler, MD;  Location: [facility];  Service: Gastroenterology;  Laterality: N/A;  . EYE SURGERY Bilateral   . FINGER SURGERY Right    right thumb  . HARDWARE REMOVAL Right 05/15/2016   Procedure: Right Lumbar Two-Lumbar Five Removal of Hardware;  Surgeon: Kristeen Miss, MD;  Location: [facility];  Service: Neurosurgery;  Laterality: Right;  Right L2-5 removal of hardware  . INGUINAL HERNIA REPAIR     x4  . IR RADIOLOGIST EVAL & MGMT  07/10/2018  . IR RADIOLOGIST EVAL & MGMT  09/05/2018  . IR SACROPLASTY BILATERAL  07/15/2018  . REPAIR SPIGELIAN HERNIA  2011  . [place] ,2014  . TONSILLECTOMY    . UPPER GASTROINTESTINAL ENDOSCOPY    . VASECTOMY      Allergies  Allergen Reactions  . Celebrex [Celecoxib] Other (See Comments)    Created stomach ulcers.  Yvette Rack [Cyclobenzaprine] Other (See Comments)    Caught in hiatal hernia and caused extreme pain    Allergies as of 06/07/2021       Reactions   Celebrex [celecoxib] Other (See  Comments)   Created stomach ulcers.   Flexeril [cyclobenzaprine] Other (See Comments)   Caught in hiatal hernia and caused extreme pain  Medication List        Accurate as of June 07, 2021 11:59 PM. If you have any questions, ask your nurse or doctor.          acetaminophen 500 MG tablet Commonly known as: TYLENOL Take 500 mg by mouth 3 (three) times daily.   buPROPion 150 MG 12 hr tablet Commonly known as: WELLBUTRIN SR Take 200 mg by mouth 2 (two) times daily.   carbamide peroxide 6.5 % OTIC solution Commonly known as: DEBROX Place 5-10 drops into both ears every 30 (thirty) days. Flush ears every 3 months due to excessive cercumen build up Once A Day on the 15th of Every 3rd Month   carboxymethylcellul-glycerin 0.5-0.9 % ophthalmic solution Commonly known as: REFRESH OPTIVE Place 1 drop into both eyes 3 (three) times daily.   CENTRUM ADULTS PO Take 1 tablet by mouth daily.   PRESERVISION AREDS 2 PO Take 1 capsule by mouth 2 (two) times daily.   chlorhexidine 0.12 % solution Commonly known as: PERIDEX Use as directed 15 mLs in the mouth or throat at bedtime.   cholecalciferol 1000 units tablet Commonly known as: VITAMIN D Take 1,000 Units by mouth daily.   cyanocobalamin 1000 MCG tablet Take 1,000 mcg by mouth daily.   dextromethorphan-guaiFENesin 30-600 MG 12hr tablet Commonly known as: MUCINEX DM Take 1 tablet by mouth 2 (two) times daily as needed.   diclofenac Sodium 1 % Gel Commonly known as: VOLTAREN Apply 2 g topically 4 (four) times daily.   gabapentin 100 MG capsule Commonly known as: NEURONTIN Take 2 capsules (200 mg total) by mouth 2 (two) times daily.   HYDROcodone-acetaminophen 5-325 MG tablet Commonly known as: NORCO/VICODIN Take one tablet by mouth every 6 hours as needed for moderate pain. Do not exceed 4gm of Tylenol in 24 hours.   lactase 3000 units tablet Commonly known as: LACTAID Take 4,500 Units by mouth 3 (three)  times daily with meals.   lovastatin 20 MG tablet Commonly known as: MEVACOR Take 20 mg by mouth at bedtime.   pantoprazole 40 MG tablet Commonly known as: PROTONIX Take 40 mg by mouth every morning.   polyethylene glycol 17 g packet Commonly known as: MIRALAX / GLYCOLAX Take 17 g by mouth daily as needed.   propranolol 10 MG tablet Commonly known as: INDERAL Take 10 mg by mouth daily.   rOPINIRole 1 MG tablet Commonly known as: Requip Take 1 tablet (1 mg total) by mouth at bedtime.   senna 8.6 MG tablet Commonly known as: SENOKOT Take 1 tablet by mouth daily.   zinc oxide 20 % ointment Apply 1 application topically as needed for irritation.        Review of Systems  Constitutional:  Positive for fatigue and unexpected weight change. Negative for appetite change and fever.       Gradual weight loss  HENT:  Positive for congestion and hearing loss. Negative for postnasal drip, sinus pain and voice change.   Eyes:  Negative for visual disturbance.  Respiratory:  Positive for shortness of breath. Negative for cough and wheezing.        Chronic non productive cough. DOE  Cardiovascular:  Negative for chest pain and leg swelling.  Gastrointestinal:  Negative for abdominal pain and constipation.       Fecal incontinent.   Genitourinary:  Positive for difficulty urinating and hematuria. Negative for penile discharge, penile pain, penile swelling and scrotal swelling.       Foley  Musculoskeletal:  Positive for arthralgias, back pain, gait problem and neck pain.       R 2nd 3rd hammer toes pain with walking, L 4th, 5th toes pain when touched, right lower back to the right leg pain when sitting in w/c for a long period of time.   Skin:  Negative for color change.  Neurological:  Positive for tremors. Negative for speech difficulty and light-headedness.       Memory lapses. Essential tremor, peripheral neuropathy, RLS  Psychiatric/Behavioral:  Negative for behavioral problems  and sleep disturbance.    Immunization History  Administered Date(s) Administered  . Influenza, High Dose Seasonal PF 09/17/2019  . Influenza-Unspecified 09/16/2018, 09/07/2020  . Moderna SARS-COV2 Booster Vaccination 10/18/2020, 05/11/2021  . Moderna Sars-Covid-2 Vaccination 12/08/2019, 01/05/2020  . Pneumococcal Conjugate-13 12/29/2013  . Pneumococcal Polysaccharide-23 09/08/2005  . Tdap 05/25/2005, 08/08/2018  . Zoster Recombinat (Shingrix) 10/17/2017, 01/25/2018   Pertinent  Health Maintenance Due  Topic Date Due  . OPHTHALMOLOGY EXAM  Never done  . URINE MICROALBUMIN  Never done  . FOOT EXAM  09/28/2019  . INFLUENZA VACCINE  07/04/2021  . PNA vac Low Risk Adult  Completed  . HEMOGLOBIN A1C  Discontinued   Fall Risk  01/01/2019 09/24/2018 01/04/2017 10/06/2016 09/29/2016  Falls in the past year? 0 No No No No   Functional Status Survey:    Vitals:   06/07/21 1425  BP: 109/71  Pulse: (!) 55  Resp: 16  Temp: (!) 97.4 F (36.3 C)  SpO2: 97%  Weight: 140 lb (63.5 kg)  Height: '5\' 6"'  (1.676 m)   Body mass index is 22.6 kg/m. Physical Exam Vitals and nursing note reviewed.  Constitutional:      General: He is not in acute distress.    Appearance: He is not ill-appearing.     Comments: Feel tired.   HENT:     Head: Normocephalic and atraumatic.     Nose: Nose normal.     Mouth/Throat:     Mouth: Mucous membranes are dry.  Eyes:     Extraocular Movements: Extraocular movements intact.     Conjunctiva/sclera: Conjunctivae normal.     Pupils: Pupils are equal, round, and reactive to light.  Cardiovascular:     Rate and Rhythm: Normal rate and regular rhythm.     Heart sounds: Murmur heard.     Comments: Weak DP pulse on the right Pulmonary:     Effort: Pulmonary effort is normal.     Breath sounds: Rales present.     Comments: Posterior left basilar rales.  Abdominal:     General: Bowel sounds are normal.     Palpations: Abdomen is soft.     Tenderness: no  abdominal tenderness There is no right CVA tenderness, left CVA tenderness, guarding or rebound.  Genitourinary:    Comments: Foley, dark red urine in the foley collecting bad.  Musculoskeletal:        General: Tenderness present.     Cervical back: Normal range of motion and neck supple.     Right lower leg: No edema.     Left lower leg: No edema.     Comments: S/p the right great and 4th toe amputation. R 2nd 3rd hammer toes pain with walking, L 4th, 5th toes pain when touched, right lower back to the right leg pain when sitting in w/c for a long period of time. Left foot flexion contracture.   Skin:    General: Skin is warm and dry.  Neurological:  General: No focal deficit present.     Mental Status: He is alert. Mental status is at baseline.     Gait: Gait abnormal.     Comments: Oriented to person, place.   Psychiatric:        Mood and Affect: Mood normal.        Behavior: Behavior normal.        Thought Content: Thought content normal.        Judgment: Judgment normal.    Labs reviewed: Recent Labs    02/14/21 0000 05/16/21 0000 06/08/21 0000  NA 139 138 135*  K 4.2 4.0 4.2  CL 107 106 104  CO2 24* 24* 21  BUN '16 17 17  ' CREATININE 0.8 0.8 0.6  CALCIUM 9.0 8.8 8.8   Recent Labs    02/14/21 0000 05/16/21 0000 06/08/21 0000  AST 39 32 27  ALT '15 13 11  ' ALKPHOS 89 93 90  ALBUMIN 3.4* 3.4* 3.4*   Recent Labs    02/14/21 0000 05/16/21 0000 06/08/21 0000  WBC 7.9 7.5 8.3  NEUTROABS 6,012.00 5,460.00 6,574.00  HGB 12.9* 11.8* 12.3*  HCT 40* 36* 37*  PLT 264 258 259   Lab Results  Component Value Date   TSH 2.68 12/10/2020   Lab Results  Component Value Date   HGBA1C 5.2 08/29/2018   Lab Results  Component Value Date   CHOL 158 08/29/2018   HDL 34 (A) 05/06/2020   LDLCALC 103 05/06/2020   TRIG 154 05/06/2020    Significant Diagnostic Results in last 30 days:  No results found.  Assessment/Plan Weight loss Adult failure to thrive, weight  loss #5Ibs in the past month, #13Ibs in the past 2 months. May consider trying Mirtazapine again.   Urinary retention f/u Urology. Blood in urine, denied abd pain, staff called, reported urine is clear after Foley was changed. UA C/S ordered.   REM sleep behavior disorder OSA, saw Neurology 11/17/20, uses CPAP, on Requip 73m qd. Sleep study 10/25/20  Essential tremor Essential tremor, stable, on Propranolol 12mqd. Bun/creat 17/0.8 05/16/21  GERD GERD, stable, on Pantoprazole 4010md. Hgb 11.8 05/16/21  Neuropathy Neuropathic pain in BLE, takes Gabapentin 300m34md,  Tylenol 500mg23m, Norco q6hr prn. c/o R 2nd 3rd hammer toes pain with walking, L 4th, 5th toes pain when touched, right lower back to the right leg pain when sitting in w/c for a long period of time.   Depression, recurrent (HCC) [place] mood is stable, on Wellbutrin, Mirtazapine, TSH 2.68 1/6/  Flexion contracture of joint of foot, left Left foot flexion contracture, f/u podiatry, Ortho.            Family/ staff Communication: plan of care reviewed with the patient and charge nurse.   Labs/tests ordered:   CBC/diff, CMP/eGFR, UA C/S  Time spend 35 minutes.

## 2021-06-07 NOTE — Assessment & Plan Note (Signed)
His mood is stable, on Wellbutrin, Mirtazapine, TSH 2.68 1/6/

## 2021-06-07 NOTE — Assessment & Plan Note (Signed)
OSA, saw Neurology 11/17/20, uses CPAP, on Requip 2mg  qd. Sleep study 10/25/20

## 2021-06-08 DIAGNOSIS — I10 Essential (primary) hypertension: Secondary | ICD-10-CM | POA: Diagnosis not present

## 2021-06-08 DIAGNOSIS — M25551 Pain in right hip: Secondary | ICD-10-CM | POA: Diagnosis not present

## 2021-06-08 DIAGNOSIS — Z960 Presence of urogenital implants: Secondary | ICD-10-CM | POA: Diagnosis not present

## 2021-06-08 DIAGNOSIS — R2689 Other abnormalities of gait and mobility: Secondary | ICD-10-CM | POA: Diagnosis not present

## 2021-06-08 DIAGNOSIS — I2510 Atherosclerotic heart disease of native coronary artery without angina pectoris: Secondary | ICD-10-CM | POA: Diagnosis not present

## 2021-06-08 DIAGNOSIS — R293 Abnormal posture: Secondary | ICD-10-CM | POA: Diagnosis not present

## 2021-06-08 DIAGNOSIS — M6281 Muscle weakness (generalized): Secondary | ICD-10-CM | POA: Diagnosis not present

## 2021-06-08 DIAGNOSIS — R1312 Dysphagia, oropharyngeal phase: Secondary | ICD-10-CM | POA: Diagnosis not present

## 2021-06-08 LAB — BASIC METABOLIC PANEL
BUN: 17 (ref 4–21)
CO2: 21 (ref 13–22)
Chloride: 104 (ref 99–108)
Creatinine: 0.6 (ref 0.6–1.3)
Glucose: 123
Potassium: 4.2 (ref 3.4–5.3)
Sodium: 135 — AB (ref 137–147)

## 2021-06-08 LAB — HEPATIC FUNCTION PANEL
ALT: 11 (ref 10–40)
AST: 27 (ref 14–40)
Alkaline Phosphatase: 90 (ref 25–125)
Bilirubin, Total: 0.4

## 2021-06-08 LAB — CBC AND DIFFERENTIAL
HCT: 37 — AB (ref 41–53)
Hemoglobin: 12.3 — AB (ref 13.5–17.5)
Neutrophils Absolute: 6574
Platelets: 259 (ref 150–399)
WBC: 8.3

## 2021-06-08 LAB — COMPREHENSIVE METABOLIC PANEL
Albumin: 3.4 — AB (ref 3.5–5.0)
Calcium: 8.8 (ref 8.7–10.7)
Globulin: 3.6

## 2021-06-08 LAB — CBC: RBC: 4.1 (ref 3.87–5.11)

## 2021-06-09 DIAGNOSIS — R1312 Dysphagia, oropharyngeal phase: Secondary | ICD-10-CM | POA: Diagnosis not present

## 2021-06-09 DIAGNOSIS — M25551 Pain in right hip: Secondary | ICD-10-CM | POA: Diagnosis not present

## 2021-06-09 DIAGNOSIS — R293 Abnormal posture: Secondary | ICD-10-CM | POA: Diagnosis not present

## 2021-06-09 DIAGNOSIS — R2689 Other abnormalities of gait and mobility: Secondary | ICD-10-CM | POA: Diagnosis not present

## 2021-06-09 DIAGNOSIS — M6281 Muscle weakness (generalized): Secondary | ICD-10-CM | POA: Diagnosis not present

## 2021-06-09 DIAGNOSIS — R319 Hematuria, unspecified: Secondary | ICD-10-CM | POA: Diagnosis not present

## 2021-06-09 DIAGNOSIS — N390 Urinary tract infection, site not specified: Secondary | ICD-10-CM | POA: Diagnosis not present

## 2021-06-09 DIAGNOSIS — Z960 Presence of urogenital implants: Secondary | ICD-10-CM | POA: Diagnosis not present

## 2021-06-10 ENCOUNTER — Non-Acute Institutional Stay (SKILLED_NURSING_FACILITY): Payer: Medicare Other | Admitting: Orthopedic Surgery

## 2021-06-10 ENCOUNTER — Encounter: Payer: Self-pay | Admitting: Orthopedic Surgery

## 2021-06-10 DIAGNOSIS — G4752 REM sleep behavior disorder: Secondary | ICD-10-CM | POA: Diagnosis not present

## 2021-06-10 DIAGNOSIS — Z960 Presence of urogenital implants: Secondary | ICD-10-CM | POA: Diagnosis not present

## 2021-06-10 DIAGNOSIS — K5901 Slow transit constipation: Secondary | ICD-10-CM | POA: Diagnosis not present

## 2021-06-10 DIAGNOSIS — K219 Gastro-esophageal reflux disease without esophagitis: Secondary | ICD-10-CM | POA: Diagnosis not present

## 2021-06-10 DIAGNOSIS — Z8546 Personal history of malignant neoplasm of prostate: Secondary | ICD-10-CM | POA: Diagnosis not present

## 2021-06-10 DIAGNOSIS — G629 Polyneuropathy, unspecified: Secondary | ICD-10-CM

## 2021-06-10 DIAGNOSIS — F339 Major depressive disorder, recurrent, unspecified: Secondary | ICD-10-CM | POA: Diagnosis not present

## 2021-06-10 DIAGNOSIS — G250 Essential tremor: Secondary | ICD-10-CM | POA: Diagnosis not present

## 2021-06-10 DIAGNOSIS — R338 Other retention of urine: Secondary | ICD-10-CM | POA: Diagnosis not present

## 2021-06-10 DIAGNOSIS — R339 Retention of urine, unspecified: Secondary | ICD-10-CM

## 2021-06-10 DIAGNOSIS — M6281 Muscle weakness (generalized): Secondary | ICD-10-CM | POA: Diagnosis not present

## 2021-06-10 DIAGNOSIS — R634 Abnormal weight loss: Secondary | ICD-10-CM

## 2021-06-10 DIAGNOSIS — R1319 Other dysphagia: Secondary | ICD-10-CM

## 2021-06-10 DIAGNOSIS — M25551 Pain in right hip: Secondary | ICD-10-CM | POA: Diagnosis not present

## 2021-06-10 DIAGNOSIS — G2581 Restless legs syndrome: Secondary | ICD-10-CM | POA: Diagnosis not present

## 2021-06-10 DIAGNOSIS — R2689 Other abnormalities of gait and mobility: Secondary | ICD-10-CM | POA: Diagnosis not present

## 2021-06-10 DIAGNOSIS — R293 Abnormal posture: Secondary | ICD-10-CM | POA: Diagnosis not present

## 2021-06-10 DIAGNOSIS — R1312 Dysphagia, oropharyngeal phase: Secondary | ICD-10-CM | POA: Diagnosis not present

## 2021-06-10 MED ORDER — MELATONIN 5 MG PO TABS
5.0000 mg | ORAL_TABLET | Freq: Every day | ORAL | 0 refills | Status: AC
Start: 2021-06-10 — End: ?

## 2021-06-10 MED ORDER — ROPINIROLE HCL 1 MG PO TABS: 2.0000 mg | ORAL_TABLET | Freq: Every day | ORAL | 0 refills | Status: AC

## 2021-06-10 NOTE — Progress Notes (Signed)
Location:   [facility] Room Number: 26 Place of Service:  SNF ([id number]) Provider:  Windell Moulding, NP  Charles Dad, MD  Patient Care Team: Charles Dad, MD as PCP - General (Internal Medicine) [name], MD (Hematology and Oncology) [name], MD as Attending Physician (Urology) [name], MD (Gastroenterology)  Extended Emergency Contact Information Primary Emergency Contact: Clemon, Devaul Mobile Phone: [phone] Relation: Son Preferred language: English Interpreter needed? No Secondary Emergency Contact: Leckrone,Janet H Address: [address]          [address]          [place], [place] [address] [name] of [place] Phone: [phone] Mobile Phone: [phone] Relation: Spouse  Code Status:  DNR Goals of care: Advanced Directive information Advanced Directives 06/10/2021  Does Patient Have a Medical Advance Directive? Yes  Type of Paramedic of [place];Living will;Out of facility DNR (pink MOST or yellow form)  Does patient want to make changes to medical advance directive? No - Patient declined  Copy of [place] in Chart? Yes - validated most recent copy scanned in chart (See row information)  Would patient like information on creating a medical advance directive? -  Pre-existing out of facility DNR order (yellow form or pink MOST form) Yellow form placed in chart (order not valid for inpatient use);Pink MOST form placed in chart (order not valid for inpatient use)     Chief Complaint  Patient presents with   Medical Management of Chronic Issues   Health Maintenance    Eye exam, urine microalbumin, foot exam    HPI:  Pt is a 87 y.o. male seen today for medical management of chronic diseases.    He currently resides in the skilled nursing unit at [facility]. Past medical history includes: OSA, neuropathy, REM sleep behavior disorder, essential tremor, GERD,  depression, constipation, hyperlipidemia, left foot contracture, and adult failure to thrive.  Seen last month for medication review. He continues to have increased fatigue and excessive sleepiness after medications decreased. Wife reports he has not used his CPAP within last 4 days. He has missed visits with loved ones due to sleepiness. He is also eating less and losing weight.   Neuropathy- denies pain or numbness today, gabapentin 200 mg bid REM sleep behavior disorder- excessive sleepiness continues, Requip 1mg  daily Essential tremor- propranolol 10 mg daily GERD- Protonix 40 mg daily Depression- Wellbutrin 200 mg bid, Remeron 7.5 bid Constipation, daily bowel movements with miralax Urinary retention- seen by urology today, indwelling foley draining well, some hematuria noted Hyperlipidemia- lovastatin 20 mg daily Adult failure to thrive- continues to lose weight, less interactive with family  No recent falls, injuries, aspirations or behavioral outbursts  Recent weights:  07/06- 137.7 lbs  06/01- 145.4 lbs  05/04- 153.7 lbs  Nurse does not report any concerns, vitals stable.    Past Medical History:  Diagnosis Date   Anemia    Arthritis    AVM (arteriovenous malformation)    Barrett's esophagus    BPH (benign prostatic hyperplasia)    Cataract    Constipation    CPAP (continuous positive airway pressure) dependence    Depression    Diverticulosis    diverticular bleeding   Diverticulosis    GERD (gastroesophageal reflux disease)    History of radiation therapy    HOH (hard of hearing)    wears hearing aids   Hyperlipidemia    Hypoglycemia  Lymphocytosis 06/2012   Mastodynia    Murmur    slight   Nasal congestion    Osteoporosis    Osteoporosis    Prostate cancer ([place])    Sleep apnea    wears CPAP   Urinary retention with incomplete bladder emptying    pt uses bathroom and within 30 mins needs to go again   Vitamin D deficiency    Past Surgical History:   Procedure Laterality Date   AMPUTATION TOE Right 09/20/2017   Procedure: Right 4th toe amputation;  Surgeon: Wylene Simmer, MD;  Location: [facility];  Service: Orthopedics;  Laterality: Right;  foot block   AMPUTATION TOE Right 02/05/2019   Procedure: RIGHT GREAT TOE AMPUTATION;  Surgeon: Leandrew Koyanagi, MD;  Location: [facility];  Service: Orthopedics;  Laterality: Right;   BACK SURGERY  04/2013   back surgury     f   BLEPHAROPLASTY Bilateral 02/23/15   CATARACT EXTRACTION     COLONOSCOPY     COLONOSCOPY N/A 10/02/2017   Procedure: COLONOSCOPY;  Surgeon: Doran Stabler, MD;  Location: [facility];  Service: Gastroenterology;  Laterality: N/A;   COLONOSCOPY WITH PROPOFOL N/A 10/01/2017   Procedure: COLONOSCOPY WITH PROPOFOL;  Surgeon: Doran Stabler, MD;  Location: [facility];  Service: Gastroenterology;  Laterality: N/A;   COLONOSCOPY WITH PROPOFOL N/A 03/07/2018   Procedure: COLONOSCOPY WITH PROPOFOL;  Surgeon: [name], MD;  Location: [facility];  Service: Endoscopy;  Laterality: N/A;   ESOPHAGOGASTRODUODENOSCOPY (EGD) WITH PROPOFOL N/A 10/01/2017   Procedure: ESOPHAGOGASTRODUODENOSCOPY (EGD) WITH PROPOFOL;  Surgeon: Doran Stabler, MD;  Location: [facility];  Service: Gastroenterology;  Laterality: N/A;   EYE SURGERY Bilateral    FINGER SURGERY Right    right thumb   HARDWARE REMOVAL Right 05/15/2016   Procedure: Right Lumbar Two-Lumbar Five Removal of Hardware;  Surgeon: Kristeen Miss, MD;  Location: [facility];  Service: Neurosurgery;  Laterality: Right;  Right L2-5 removal of hardware   INGUINAL HERNIA REPAIR     x4   IR RADIOLOGIST EVAL & MGMT  07/10/2018   IR RADIOLOGIST EVAL & MGMT  09/05/2018   IR SACROPLASTY BILATERAL  07/15/2018   REPAIR SPIGELIAN HERNIA  2011   SPINAL FUSION  1996 ,2014   TONSILLECTOMY     UPPER GASTROINTESTINAL ENDOSCOPY     VASECTOMY      Allergies  Allergen Reactions   Celebrex [Celecoxib] Other  (See Comments)    Created stomach ulcers.   Flexeril [Cyclobenzaprine] Other (See Comments)    Caught in hiatal hernia and caused extreme pain    Allergies as of 06/10/2021       Reactions   Celebrex [celecoxib] Other (See Comments)   Created stomach ulcers.   Flexeril [cyclobenzaprine] Other (See Comments)   Caught in hiatal hernia and caused extreme pain        Medication List        Accurate as of June 10, 2021 11:45 AM. If you have any questions, ask your nurse or doctor.          acetaminophen 500 MG tablet Commonly known as: TYLENOL Take 500 mg by mouth 3 (three) times daily.   buPROPion 150 MG 12 hr tablet Commonly known as: WELLBUTRIN SR Take 200 mg by mouth 2 (two) times daily.   carbamide peroxide 6.5 % OTIC solution Commonly known as: DEBROX Place 5-10 drops into both ears every 30 (thirty) days. Flush ears every  3 months due to excessive cercumen build up Once A Day on the 15th of Every 3rd Month   carboxymethylcellul-glycerin 0.5-0.9 % ophthalmic solution Commonly known as: REFRESH OPTIVE Place 1 drop into both eyes 3 (three) times daily.   CENTRUM ADULTS PO Take 1 tablet by mouth daily.   PRESERVISION AREDS 2 PO Take 1 capsule by mouth 2 (two) times daily.   chlorhexidine 0.12 % solution Commonly known as: PERIDEX Use as directed 15 mLs in the mouth or throat at bedtime.   cholecalciferol 1000 units tablet Commonly known as: VITAMIN D Take 1,000 Units by mouth daily.   cyanocobalamin 1000 MCG tablet Take 1,000 mcg by mouth daily.   dextromethorphan-guaiFENesin 30-600 MG 12hr tablet Commonly known as: MUCINEX DM Take 1 tablet by mouth 2 (two) times daily as needed.   diclofenac Sodium 1 % Gel Commonly known as: VOLTAREN Apply 2 g topically 4 (four) times daily.   gabapentin 100 MG capsule Commonly known as: NEURONTIN Take 2 capsules (200 mg total) by mouth 2 (two) times daily.   HYDROcodone-acetaminophen 5-325 MG tablet Commonly  known as: NORCO/VICODIN Take one tablet by mouth every 6 hours as needed for moderate pain. Do not exceed 4gm of Tylenol in 24 hours.   lactase 3000 units tablet Commonly known as: LACTAID Take 4,500 Units by mouth 3 (three) times daily with meals.   lovastatin 20 MG tablet Commonly known as: MEVACOR Take 20 mg by mouth at bedtime.   mirtazapine 7.5 MG tablet Commonly known as: REMERON Take 7.5 mg by mouth at bedtime.   pantoprazole 40 MG tablet Commonly known as: PROTONIX Take 40 mg by mouth every morning.   polyethylene glycol 17 g packet Commonly known as: MIRALAX / GLYCOLAX Take 17 g by mouth daily as needed.   propranolol 10 MG tablet Commonly known as: INDERAL Take 10 mg by mouth daily.   rOPINIRole 1 MG tablet Commonly known as: Requip Take 1 tablet (1 mg total) by mouth at bedtime.   senna 8.6 MG tablet Commonly known as: SENOKOT Take 1 tablet by mouth daily.   zinc oxide 20 % ointment Apply 1 application topically as needed for irritation.        Review of Systems  Constitutional:  Positive for fatigue. Negative for activity change, appetite change and fever.  HENT:  Positive for hearing loss. Negative for dental problem and trouble swallowing.        Hearing aid  Eyes:  Negative for visual disturbance.       Glasses  Respiratory:  Negative for cough, shortness of breath and wheezing.   Cardiovascular:  Negative for chest pain and leg swelling.  Gastrointestinal:  Positive for constipation. Negative for abdominal distention, abdominal pain, diarrhea and nausea.  Genitourinary:  Positive for hematuria.       Indwelling foley  Musculoskeletal:  Positive for arthralgias, gait problem and myalgias.  Skin: Negative.   Neurological:  Positive for weakness. Negative for dizziness and light-headedness.  Psychiatric/Behavioral:  Positive for dysphoric mood and sleep disturbance. The patient is not nervous/anxious.    Immunization History  Administered  Date(s) Administered   Influenza, High Dose Seasonal PF 09/17/2019   Influenza-Unspecified 09/16/2018, 09/07/2020   Moderna SARS-COV2 Booster Vaccination 10/18/2020, 05/11/2021   Moderna Sars-Covid-2 Vaccination 12/08/2019, 01/05/2020   Pneumococcal Conjugate-13 12/29/2013   Pneumococcal Polysaccharide-23 09/08/2005   Tdap 05/25/2005, 08/08/2018   Zoster Recombinat (Shingrix) 10/17/2017, 01/25/2018   Pertinent  Health Maintenance Due  Topic Date Due   OPHTHALMOLOGY  EXAM  Never done   URINE MICROALBUMIN  Never done   FOOT EXAM  09/28/2019   INFLUENZA VACCINE  07/04/2021   PNA vac Low Risk Adult  Completed   HEMOGLOBIN A1C  Discontinued   Fall Risk  01/01/2019 09/24/2018 01/04/2017 10/06/2016 09/29/2016  Falls in the past year? 0 No No No No   Functional Status Survey:    Vitals:   06/10/21 1109  BP: 109/61  Pulse: 63  Resp: 18  Temp: 97.9 F (36.6 C)  SpO2: 95%  Weight: 137 lb 11.2 oz (62.5 kg)  Height: 5\' 6"  (1.676 m)   Body mass index is 22.23 kg/m. Physical Exam Vitals reviewed.  Constitutional:      General: He is not in acute distress. HENT:     Head: Normocephalic.     Right Ear: There is no impacted cerumen.     Left Ear: There is no impacted cerumen.     Ears:     Comments: Bilateral hearing aids    Nose: Nose normal.     Mouth/Throat:     Mouth: Mucous membranes are moist.  Eyes:     General:        Right eye: No discharge.        Left eye: No discharge.  Neck:     Vascular: No carotid bruit.  Cardiovascular:     Rate and Rhythm: Normal rate and regular rhythm.     Pulses: Normal pulses.     Heart sounds: Normal heart sounds. No murmur heard. Pulmonary:     Effort: Pulmonary effort is normal. No respiratory distress.     Breath sounds: Normal breath sounds. No wheezing.  Abdominal:     General: There is no distension.     Palpations: Abdomen is soft.     Tenderness: There is no abdominal tenderness.  Musculoskeletal:     Cervical back: Normal  range of motion.     Right lower leg: No edema.     Left lower leg: No edema.  Lymphadenopathy:     Cervical: No cervical adenopathy.  Skin:    General: Skin is warm and dry.     Capillary Refill: Capillary refill takes less than 2 seconds.  Neurological:     General: No focal deficit present.     Mental Status: He is alert and oriented to person, place, and time.     Motor: Weakness present.     Gait: Gait abnormal.     Comments: wheelchair  Psychiatric:        Mood and Affect: Mood normal. Affect is flat.        Behavior: Behavior normal.    Labs reviewed: Recent Labs    02/14/21 0000 05/16/21 0000 06/08/21 0000  NA 139 138 135*  K 4.2 4.0 4.2  CL 107 106 104  CO2 24* 24* 21  BUN 16 17 17   CREATININE 0.8 0.8 0.6  CALCIUM 9.0 8.8 8.8   Recent Labs    02/14/21 0000 05/16/21 0000 06/08/21 0000  AST 39 32 27  ALT 15 13 11   ALKPHOS 89 93 90  ALBUMIN 3.4* 3.4* 3.4*   Recent Labs    02/14/21 0000 05/16/21 0000 06/08/21 0000  WBC 7.9 7.5 8.3  NEUTROABS 6,012.00 5,460.00 6,574.00  HGB 12.9* 11.8* 12.3*  HCT 40* 36* 37*  PLT 264 258 259   Lab Results  Component Value Date   TSH 2.68 12/10/2020   Lab Results  Component Value Date  HGBA1C 5.2 08/29/2018   Lab Results  Component Value Date   CHOL 158 08/29/2018   HDL 34 (A) 05/06/2020   LDLCALC 103 05/06/2020   TRIG 154 05/06/2020    Significant Diagnostic Results in last 30 days:  No results found.  Assessment/Plan 1. Weight loss - he has dropped another 8 lbs in past month - cont remeron 7.5 mg daily  2. REM sleep behavior disorder - ongoing, excessive sleepiness during the day - will increase requip to 2 mg po daily  - start melatonin 5 mg qhs  3. Urinary retention - seen by urology today - some hematuria noted, suspect from foley changes - f/u in 6 months  4. Essential tremor - stable with propranolol 10 mg daily  5. Gastroesophageal reflux disease without esophagitis - stable with  Protonix 40 mg daily - hgb 12.3 06/08/21  6. Neuropathy - stable with scheduled gabapentin 200 mg, tylenol 500 mg tid and norco prn  7. Depression, recurrent ([place]) - flat affect, would like to be awake more for wife/family - Wellbutrin 300 mg daily - Remeron 7.5 mg daily  8. Slow transit constipation - stable with miralax  9. DYSPHAGIA - cont to work with ST - no recent aspirations - cont mechanical soft diet and nectar thick fluids   Family/ staff Communication: plan discussed with patient, wife and nurse  Labs/tests ordered:  none

## 2021-06-13 DIAGNOSIS — R1312 Dysphagia, oropharyngeal phase: Secondary | ICD-10-CM | POA: Diagnosis not present

## 2021-06-13 DIAGNOSIS — Z960 Presence of urogenital implants: Secondary | ICD-10-CM | POA: Diagnosis not present

## 2021-06-13 DIAGNOSIS — R2689 Other abnormalities of gait and mobility: Secondary | ICD-10-CM | POA: Diagnosis not present

## 2021-06-13 DIAGNOSIS — R293 Abnormal posture: Secondary | ICD-10-CM | POA: Diagnosis not present

## 2021-06-13 DIAGNOSIS — M6281 Muscle weakness (generalized): Secondary | ICD-10-CM | POA: Diagnosis not present

## 2021-06-13 DIAGNOSIS — M25551 Pain in right hip: Secondary | ICD-10-CM | POA: Diagnosis not present

## 2021-06-13 NOTE — Assessment & Plan Note (Signed)
06/09/21 UA positive nitrite, packed wbc, 3+ leukocyte esterase, rbc >60, many bacteria. K. Pneumoniae >100,000c/ml, Cipro 500mg  bid x 7days started 06/11/21

## 2021-06-14 DIAGNOSIS — R2689 Other abnormalities of gait and mobility: Secondary | ICD-10-CM | POA: Diagnosis not present

## 2021-06-14 DIAGNOSIS — Z960 Presence of urogenital implants: Secondary | ICD-10-CM | POA: Diagnosis not present

## 2021-06-14 DIAGNOSIS — R293 Abnormal posture: Secondary | ICD-10-CM | POA: Diagnosis not present

## 2021-06-14 DIAGNOSIS — M25551 Pain in right hip: Secondary | ICD-10-CM | POA: Diagnosis not present

## 2021-06-14 DIAGNOSIS — R1312 Dysphagia, oropharyngeal phase: Secondary | ICD-10-CM | POA: Diagnosis not present

## 2021-06-14 DIAGNOSIS — M6281 Muscle weakness (generalized): Secondary | ICD-10-CM | POA: Diagnosis not present

## 2021-06-15 DIAGNOSIS — M6281 Muscle weakness (generalized): Secondary | ICD-10-CM | POA: Diagnosis not present

## 2021-06-15 DIAGNOSIS — R293 Abnormal posture: Secondary | ICD-10-CM | POA: Diagnosis not present

## 2021-06-15 DIAGNOSIS — R2689 Other abnormalities of gait and mobility: Secondary | ICD-10-CM | POA: Diagnosis not present

## 2021-06-15 DIAGNOSIS — R1312 Dysphagia, oropharyngeal phase: Secondary | ICD-10-CM | POA: Diagnosis not present

## 2021-06-15 DIAGNOSIS — Z960 Presence of urogenital implants: Secondary | ICD-10-CM | POA: Diagnosis not present

## 2021-06-15 DIAGNOSIS — M25551 Pain in right hip: Secondary | ICD-10-CM | POA: Diagnosis not present

## 2021-06-17 DIAGNOSIS — R293 Abnormal posture: Secondary | ICD-10-CM | POA: Diagnosis not present

## 2021-06-17 DIAGNOSIS — R1312 Dysphagia, oropharyngeal phase: Secondary | ICD-10-CM | POA: Diagnosis not present

## 2021-06-17 DIAGNOSIS — Z960 Presence of urogenital implants: Secondary | ICD-10-CM | POA: Diagnosis not present

## 2021-06-17 DIAGNOSIS — R2689 Other abnormalities of gait and mobility: Secondary | ICD-10-CM | POA: Diagnosis not present

## 2021-06-17 DIAGNOSIS — M6281 Muscle weakness (generalized): Secondary | ICD-10-CM | POA: Diagnosis not present

## 2021-06-17 DIAGNOSIS — M25551 Pain in right hip: Secondary | ICD-10-CM | POA: Diagnosis not present

## 2021-06-20 ENCOUNTER — Telehealth: Payer: Self-pay | Admitting: Orthopedic Surgery

## 2021-06-20 DIAGNOSIS — Z960 Presence of urogenital implants: Secondary | ICD-10-CM | POA: Diagnosis not present

## 2021-06-20 DIAGNOSIS — M25551 Pain in right hip: Secondary | ICD-10-CM | POA: Diagnosis not present

## 2021-06-20 DIAGNOSIS — R293 Abnormal posture: Secondary | ICD-10-CM | POA: Diagnosis not present

## 2021-06-20 DIAGNOSIS — R1312 Dysphagia, oropharyngeal phase: Secondary | ICD-10-CM | POA: Diagnosis not present

## 2021-06-20 DIAGNOSIS — R2689 Other abnormalities of gait and mobility: Secondary | ICD-10-CM | POA: Diagnosis not present

## 2021-06-20 DIAGNOSIS — M6281 Muscle weakness (generalized): Secondary | ICD-10-CM | POA: Diagnosis not present

## 2021-06-20 NOTE — Telephone Encounter (Signed)
Patient requesting nectar thick liquids be discontinued and restart thin liquids. He has been evaluated by speech therapy and dietary. Risks and precautions explained to patient regarding aspiration. Will change patient to thin liquids. Dr. Lyndel Safe notified.

## 2021-06-21 ENCOUNTER — Encounter: Payer: Self-pay | Admitting: Nurse Practitioner

## 2021-06-21 DIAGNOSIS — Z960 Presence of urogenital implants: Secondary | ICD-10-CM | POA: Diagnosis not present

## 2021-06-21 DIAGNOSIS — R293 Abnormal posture: Secondary | ICD-10-CM | POA: Diagnosis not present

## 2021-06-21 DIAGNOSIS — M6281 Muscle weakness (generalized): Secondary | ICD-10-CM | POA: Diagnosis not present

## 2021-06-21 DIAGNOSIS — R2689 Other abnormalities of gait and mobility: Secondary | ICD-10-CM | POA: Diagnosis not present

## 2021-06-21 DIAGNOSIS — R1312 Dysphagia, oropharyngeal phase: Secondary | ICD-10-CM | POA: Diagnosis not present

## 2021-06-21 DIAGNOSIS — M25551 Pain in right hip: Secondary | ICD-10-CM | POA: Diagnosis not present

## 2021-06-21 DIAGNOSIS — E119 Type 2 diabetes mellitus without complications: Secondary | ICD-10-CM | POA: Insufficient documentation

## 2021-06-22 DIAGNOSIS — R1312 Dysphagia, oropharyngeal phase: Secondary | ICD-10-CM | POA: Diagnosis not present

## 2021-06-22 DIAGNOSIS — M25551 Pain in right hip: Secondary | ICD-10-CM | POA: Diagnosis not present

## 2021-06-22 DIAGNOSIS — R293 Abnormal posture: Secondary | ICD-10-CM | POA: Diagnosis not present

## 2021-06-22 DIAGNOSIS — M6281 Muscle weakness (generalized): Secondary | ICD-10-CM | POA: Diagnosis not present

## 2021-06-22 DIAGNOSIS — Z960 Presence of urogenital implants: Secondary | ICD-10-CM | POA: Diagnosis not present

## 2021-06-22 DIAGNOSIS — R2689 Other abnormalities of gait and mobility: Secondary | ICD-10-CM | POA: Diagnosis not present

## 2021-06-24 DIAGNOSIS — M6281 Muscle weakness (generalized): Secondary | ICD-10-CM | POA: Diagnosis not present

## 2021-06-24 DIAGNOSIS — M25551 Pain in right hip: Secondary | ICD-10-CM | POA: Diagnosis not present

## 2021-06-24 DIAGNOSIS — R2689 Other abnormalities of gait and mobility: Secondary | ICD-10-CM | POA: Diagnosis not present

## 2021-06-24 DIAGNOSIS — R293 Abnormal posture: Secondary | ICD-10-CM | POA: Diagnosis not present

## 2021-06-24 DIAGNOSIS — Z960 Presence of urogenital implants: Secondary | ICD-10-CM | POA: Diagnosis not present

## 2021-06-24 DIAGNOSIS — R1312 Dysphagia, oropharyngeal phase: Secondary | ICD-10-CM | POA: Diagnosis not present

## 2021-06-29 DIAGNOSIS — R293 Abnormal posture: Secondary | ICD-10-CM | POA: Diagnosis not present

## 2021-06-29 DIAGNOSIS — R1312 Dysphagia, oropharyngeal phase: Secondary | ICD-10-CM | POA: Diagnosis not present

## 2021-06-29 DIAGNOSIS — R2689 Other abnormalities of gait and mobility: Secondary | ICD-10-CM | POA: Diagnosis not present

## 2021-06-29 DIAGNOSIS — Z960 Presence of urogenital implants: Secondary | ICD-10-CM | POA: Diagnosis not present

## 2021-06-29 DIAGNOSIS — M25551 Pain in right hip: Secondary | ICD-10-CM | POA: Diagnosis not present

## 2021-06-29 DIAGNOSIS — M6281 Muscle weakness (generalized): Secondary | ICD-10-CM | POA: Diagnosis not present

## 2021-07-05 DIAGNOSIS — A0472 Enterocolitis due to Clostridium difficile, not specified as recurrent: Secondary | ICD-10-CM | POA: Diagnosis not present

## 2021-07-06 ENCOUNTER — Non-Acute Institutional Stay (SKILLED_NURSING_FACILITY): Payer: Medicare Other | Admitting: Orthopedic Surgery

## 2021-07-06 ENCOUNTER — Encounter: Payer: Self-pay | Admitting: Orthopedic Surgery

## 2021-07-06 DIAGNOSIS — A0472 Enterocolitis due to Clostridium difficile, not specified as recurrent: Secondary | ICD-10-CM | POA: Diagnosis not present

## 2021-07-06 DIAGNOSIS — H6123 Impacted cerumen, bilateral: Secondary | ICD-10-CM | POA: Diagnosis not present

## 2021-07-06 NOTE — Progress Notes (Signed)
Location:  [facility] Room Number: 26 Place of Service:  SNF [phone]) Provider:  Windell Moulding, AGNP-C  [name], MD  Patient Care Team: [name], MD as PCP - General (Internal Medicine) [name], MD (Hematology and Oncology) [name], MD as Attending Physician (Urology) [name], MD (Gastroenterology)  Extended Emergency Contact Information Primary Emergency Contact: Theordore, Volbrecht Mobile Phone: [phone] Relation: Son Preferred language: English Interpreter needed? No Secondary Emergency Contact: Cregger,Janet H Address: [address]          [address]          [place], [place] [address] [name] of [place] Phone: [phone] Mobile Phone: [phone] Relation: Spouse  Code Status:  DNR Goals of care: Advanced Directive information Advanced Directives 06/10/2021  Does Patient Have a Medical Advance Directive? Yes  Type of Paramedic of [place];Living will;Out of facility DNR (pink MOST or yellow form)  Does patient want to make changes to medical advance directive? No - Patient declined  Copy of [place] in Chart? Yes - validated most recent copy scanned in chart (See row information)  Would patient like information on creating a medical advance directive? -  Pre-existing out of facility DNR order (yellow form or pink MOST form) Yellow form placed in chart (order not valid for inpatient use);Pink MOST form placed in chart (order not valid for inpatient use)     Chief Complaint  Patient presents with   Acute Visit    HPI:  Pt is a 87 y.o. male seen today for acute visit due to cerumen impaction.  He reports decreased hearing this morning. History of using hearing aids. He plans to see audiologist later this month. Requesting ears be examined today. Reports audiologist recommends using debrox drops a few days prior to visit. Denies ear pain, drainage or use  of qtips.   Nurse does not report any other concerns, vitals stable.    Past Medical History:  Diagnosis Date   Anemia    Arthritis    AVM (arteriovenous malformation)    Barrett's esophagus    BPH (benign prostatic hyperplasia)    Cataract    Constipation    CPAP (continuous positive airway pressure) dependence    Depression    Diverticulosis    diverticular bleeding   Diverticulosis    GERD (gastroesophageal reflux disease)    History of radiation therapy    HOH (hard of hearing)    wears hearing aids   Hyperlipidemia    Hypoglycemia    Lymphocytosis 06/2012   Mastodynia    Murmur    slight   Nasal congestion    Osteoporosis    Osteoporosis    Prostate cancer ([facility])    Sleep apnea    wears CPAP   Urinary retention with incomplete bladder emptying    pt uses bathroom and within 30 mins needs to go again   Vitamin D deficiency    Past Surgical History:  Procedure Laterality Date   AMPUTATION TOE Right 09/20/2017   Procedure: Right 4th toe amputation;  Surgeon: Wylene Simmer, MD;  Location: [facility];  Service: Orthopedics;  Laterality: Right;  foot block   AMPUTATION TOE Right 02/05/2019   Procedure: RIGHT GREAT TOE AMPUTATION;  Surgeon: Leandrew Koyanagi, MD;  Location: [facility];  Service: Orthopedics;  Laterality: Right;   BACK SURGERY  04/2013   back surgury  f   BLEPHAROPLASTY Bilateral 02/23/15   CATARACT EXTRACTION     COLONOSCOPY     COLONOSCOPY N/A 10/02/2017   Procedure: COLONOSCOPY;  Surgeon: Doran Stabler, MD;  [facility]: [facility];  Service: Gastroenterology;  Laterality: N/A;   COLONOSCOPY WITH PROPOFOL N/A 10/01/2017   Procedure: COLONOSCOPY WITH PROPOFOL;  Surgeon: Doran Stabler, MD;  Location: [facility];  Service: Gastroenterology;  Laterality: N/A;   COLONOSCOPY WITH PROPOFOL N/A 03/07/2018   Procedure: COLONOSCOPY WITH PROPOFOL;  Surgeon: [name], MD;  [facility];  Service: Endoscopy;   Laterality: N/A;   ESOPHAGOGASTRODUODENOSCOPY (EGD) WITH PROPOFOL N/A 10/01/2017   Procedure: ESOPHAGOGASTRODUODENOSCOPY (EGD) WITH PROPOFOL;  Surgeon: Doran Stabler, MD;  [facility];  Service: Gastroenterology;  Laterality: N/A;   EYE SURGERY Bilateral    FINGER SURGERY Right    right thumb   HARDWARE REMOVAL Right 05/15/2016   Procedure: Right Lumbar Two-Lumbar Five Removal of Hardware;  Surgeon: Kristeen Miss, MD;  Location: [facility];  Service: Neurosurgery;  Laterality: Right;  Right L2-5 removal of hardware   INGUINAL HERNIA REPAIR     x4   IR RADIOLOGIST EVAL & MGMT  07/10/2018   IR RADIOLOGIST EVAL & MGMT  09/05/2018   IR SACROPLASTY BILATERAL  07/15/2018   REPAIR SPIGELIAN HERNIA  2011   SPINAL FUSION  1996 ,2014   TONSILLECTOMY     UPPER GASTROINTESTINAL ENDOSCOPY     VASECTOMY      Allergies  Allergen Reactions   Celebrex [Celecoxib] Other (See Comments)    Created stomach ulcers.   Flexeril [Cyclobenzaprine] Other (See Comments)    Caught in hiatal hernia and caused extreme pain    Outpatient Encounter Medications as of 07/06/2021  Medication Sig   acetaminophen (TYLENOL) 500 MG tablet Take 500 mg by mouth 3 (three) times daily.    buPROPion (WELLBUTRIN SR) 150 MG 12 hr tablet Take 200 mg by mouth 2 (two) times daily.   carbamide peroxide (DEBROX) 6.5 % OTIC solution Place 5-10 drops into both ears every 30 (thirty) days. Flush ears every 3 months due to excessive cercumen build up Once A Day on the 15th of Every 3rd Month   carboxymethylcellul-glycerin (REFRESH OPTIVE) 0.5-0.9 % ophthalmic solution Place 1 drop into both eyes 3 (three) times daily.   chlorhexidine (PERIDEX) 0.12 % solution Use as directed 15 mLs in the mouth or throat at bedtime.   cholecalciferol (VITAMIN D) 1000 UNITS tablet Take 1,000 Units by mouth daily.   cyanocobalamin 1000 MCG tablet Take 1,000 mcg by mouth daily.   dextromethorphan-guaiFENesin (MUCINEX DM) 30-600 MG 12hr tablet  Take 1 tablet by mouth 2 (two) times daily as needed.   diclofenac Sodium (VOLTAREN) 1 % GEL Apply 2 g topically 4 (four) times daily.   gabapentin (NEURONTIN) 100 MG capsule Take 2 capsules (200 mg total) by mouth 2 (two) times daily.   HYDROcodone-acetaminophen (NORCO/VICODIN) 5-325 MG tablet Take one tablet by mouth every 6 hours as needed for moderate pain. Do not exceed 4gm of Tylenol in 24 hours.   lactase (LACTAID) 3000 units tablet Take 4,500 Units by mouth 3 (three) times daily with meals.    lovastatin (MEVACOR) 20 MG tablet Take 20 mg by mouth at bedtime.    melatonin 5 MG TABS Take 1 tablet (5 mg total) by mouth at bedtime.   mirtazapine (REMERON) 7.5 MG tablet Take 7.5 mg by mouth at bedtime.   Multiple Vitamins-Minerals (CENTRUM ADULTS  PO) Take 1 tablet by mouth daily.   Multiple Vitamins-Minerals (PRESERVISION AREDS 2 PO) Take 1 capsule by mouth 2 (two) times daily.   pantoprazole (PROTONIX) 40 MG tablet Take 40 mg by mouth every morning.    polyethylene glycol (MIRALAX / GLYCOLAX) 17 g packet Take 17 g by mouth daily as needed.   propranolol (INDERAL) 10 MG tablet Take 10 mg by mouth daily.    rOPINIRole (REQUIP) 1 MG tablet Take 2 tablets (2 mg total) by mouth at bedtime.   senna (SENOKOT) 8.6 MG tablet Take 1 tablet by mouth daily.   zinc oxide 20 % ointment Apply 1 application topically as needed for irritation.   No facility-administered encounter medications on file as of 07/06/2021.    Review of Systems  Constitutional:  Negative for activity change, appetite change, fatigue and fever.  HENT:  Positive for hearing loss. Negative for ear discharge and ear pain.        Hearing aids  Respiratory:  Negative for cough, shortness of breath and wheezing.   Cardiovascular:  Negative for chest pain and leg swelling.  Psychiatric/Behavioral:  Negative for dysphoric mood. The patient is not nervous/anxious.    Immunization History  Administered Date(s) Administered   Influenza,  High Dose Seasonal PF 09/17/2019   Influenza-Unspecified 09/16/2018, 09/07/2020   Moderna SARS-COV2 Booster Vaccination 10/18/2020, 05/11/2021   Moderna Sars-Covid-2 Vaccination 12/08/2019, 01/05/2020   Pneumococcal Conjugate-13 12/29/2013   Pneumococcal Polysaccharide-23 09/08/2005   Tdap 05/25/2005, 08/08/2018   Zoster Recombinat (Shingrix) 10/17/2017, 01/25/2018   Pertinent  Health Maintenance Due  Topic Date Due   OPHTHALMOLOGY EXAM  Never done   URINE MICROALBUMIN  Never done   FOOT EXAM  09/28/2019   INFLUENZA VACCINE  07/04/2021   PNA vac Low Risk Adult  Completed   HEMOGLOBIN A1C  Discontinued   Fall Risk  01/01/2019 09/24/2018 01/04/2017 10/06/2016 09/29/2016  Falls in the past year? 0 No No No No   Functional Status Survey:    Vitals:   07/06/21 1125  BP: (!) 147/66  Pulse: 76  Resp: 20  Temp: (!) 97.1 F (36.2 C)  SpO2: 96%  Weight: 137 lb 1.6 oz (62.2 kg)  Height: '5\' 6"'$  (1.676 m)   Body mass index is 22.13 kg/m. Physical Exam Vitals reviewed.  Constitutional:      General: He is not in acute distress. HENT:     Head: Normocephalic.     Right Ear: There is impacted cerumen.     Left Ear: There is impacted cerumen.  Cardiovascular:     Rate and Rhythm: Normal rate and regular rhythm.     Pulses: Normal pulses.     Heart sounds: Normal heart sounds. No murmur heard. Pulmonary:     Effort: Pulmonary effort is normal. No respiratory distress.     Breath sounds: Normal breath sounds. No wheezing.  Skin:    General: Skin is warm and dry.  Neurological:     General: No focal deficit present.     Mental Status: He is alert and oriented to person, place, and time.     Motor: Weakness present.     Gait: Gait abnormal.     Comments: wheelchair  Psychiatric:        Mood and Affect: Mood normal.        Behavior: Behavior normal.    Labs reviewed: Recent Labs    02/14/21 0000 05/16/21 0000 06/08/21 0000  NA 139 138 135*  K 4.2 4.0 4.2  CL 107 106 104   CO2 24* 24* 21  BUN '16 17 17  '$ CREATININE 0.8 0.8 0.6  CALCIUM 9.0 8.8 8.8   Recent Labs    02/14/21 0000 05/16/21 0000 06/08/21 0000  AST 39 32 27  ALT '15 13 11  '$ ALKPHOS 89 93 90  ALBUMIN 3.4* 3.4* 3.4*   Recent Labs    02/14/21 0000 05/16/21 0000 06/08/21 0000  WBC 7.9 7.5 8.3  NEUTROABS 6,012.00 5,460.00 6,574.00  HGB 12.9* 11.8* 12.3*  HCT 40* 36* 37*  PLT 264 258 259   Lab Results  Component Value Date   TSH 2.68 12/10/2020   Lab Results  Component Value Date   HGBA1C 5.2 08/29/2018   Lab Results  Component Value Date   CHOL 158 08/29/2018   HDL 34 (A) 05/06/2020   LDLCALC 103 05/06/2020   TRIG 154 05/06/2020    Significant Diagnostic Results in last 30 days:  No results found.  Assessment/Plan 1. Bilateral impacted cerumen - cannot visualize TM due to wax buildup in both ears - debrox- 5 gtts to left and right ears bid x 7 days - flush ears with warm water when debrox complete    Family/ staff Communication: plan discussed with patient and nurse  Labs/tests ordered:  none

## 2021-07-07 DIAGNOSIS — A0472 Enterocolitis due to Clostridium difficile, not specified as recurrent: Secondary | ICD-10-CM | POA: Diagnosis not present

## 2021-07-11 DIAGNOSIS — A0472 Enterocolitis due to Clostridium difficile, not specified as recurrent: Secondary | ICD-10-CM | POA: Diagnosis not present

## 2021-07-13 DIAGNOSIS — A0472 Enterocolitis due to Clostridium difficile, not specified as recurrent: Secondary | ICD-10-CM | POA: Diagnosis not present

## 2021-07-14 ENCOUNTER — Encounter: Payer: Self-pay | Admitting: Internal Medicine

## 2021-07-14 ENCOUNTER — Non-Acute Institutional Stay (SKILLED_NURSING_FACILITY): Payer: Medicare Other | Admitting: Internal Medicine

## 2021-07-14 DIAGNOSIS — F339 Major depressive disorder, recurrent, unspecified: Secondary | ICD-10-CM | POA: Diagnosis not present

## 2021-07-14 DIAGNOSIS — R339 Retention of urine, unspecified: Secondary | ICD-10-CM | POA: Diagnosis not present

## 2021-07-14 DIAGNOSIS — G2581 Restless legs syndrome: Secondary | ICD-10-CM

## 2021-07-14 DIAGNOSIS — G250 Essential tremor: Secondary | ICD-10-CM

## 2021-07-14 DIAGNOSIS — R634 Abnormal weight loss: Secondary | ICD-10-CM | POA: Diagnosis not present

## 2021-07-14 DIAGNOSIS — A0472 Enterocolitis due to Clostridium difficile, not specified as recurrent: Secondary | ICD-10-CM | POA: Diagnosis not present

## 2021-07-14 DIAGNOSIS — R1319 Other dysphagia: Secondary | ICD-10-CM | POA: Diagnosis not present

## 2021-07-14 DIAGNOSIS — E785 Hyperlipidemia, unspecified: Secondary | ICD-10-CM | POA: Diagnosis not present

## 2021-07-14 DIAGNOSIS — K219 Gastro-esophageal reflux disease without esophagitis: Secondary | ICD-10-CM | POA: Diagnosis not present

## 2021-07-14 DIAGNOSIS — G629 Polyneuropathy, unspecified: Secondary | ICD-10-CM

## 2021-07-14 NOTE — Progress Notes (Signed)
Location:   [facility] Room Number: 26 Place of Service:  SNF [phone]) Provider:  Veleta Miners MD   [name], MD  Patient Care Team: [name], MD as PCP - General (Internal Medicine) [name], MD (Hematology and Oncology) [name], MD as Attending Physician (Urology) [name], MD (Gastroenterology)  Extended Emergency Contact Information Primary Emergency Contact: Crusoe, O[place]ley Mobile Phone: [phone] Relation: Son Preferred language: English Interpreter needed? No Secondary Emergency Contact: Nipp,Janet H Address: [address]          [address]          [place], [place] [address] [name] of [place] Phone: [phone] Mobile Phone: [phone] Relation: Spouse  Code Status:  DNR Goals of care: Advanced Directive information Advanced Directives 07/14/2021  Does Patient Have a Medical Advance Directive? Yes  Type of Paramedic of [place];Living will;Out of facility DNR (pink MOST or yellow form)  Does patient want to make changes to medical advance directive? No - Patient declined  Copy of [place] in Chart? Yes - validated most recent copy scanned in chart (See row information)  Would patient like information on creating a medical advance directive? -  Pre-existing out of facility DNR order (yellow form or pink MOST form) Yellow form placed in chart (order not valid for inpatient use);Pink MOST form placed in chart (order not valid for inpatient use)     Chief Complaint  Patient presents with   Medical Management of Chronic Issues   Quality Metric Gaps    Eye exam, urine microalbumin, foot exam, flu shot.     HPI:  Pt is a 87 y.o. male seen today for medical management of chronic diseases.    Patient has number of medical problems including Chronic Diarrhea, H/O AVM, Barretts Esphagus, OSA on CPAP, Hypertension, h/o Prostate cancer wih Chronic  Foley Catheter, Chronic Pain due to Kyphosis and Neuropathy and C Diff colitis S/P Right Great Toe Amputation in 3/20 for Osteomyelitis  Weight loss Patient has lost almost 20 pounds since my last visit 3 months ago He was put on special dysphagia diet because of risk of aspiration but patient has refused and is now on regular diet with bite-size meats Prior his wife who also is in the facility patient is doing little better with eating now.  Patient did not have any acute complaints he said that he thinks he is doing better.  Patient continues to sleep in his wheelchair most of the time  staying in his room.  Uses CPAP as needed. Is now non Ambulatory  Past Medical History:  Diagnosis Date   Anemia    Arthritis    AVM (arteriovenous malformation)    Barrett's esophagus    BPH (benign prostatic hyperplasia)    Cataract    Constipation    CPAP (continuous positive airway pressure) dependence    Depression    Diverticulosis    diverticular bleeding   Diverticulosis    GERD (gastroesophageal reflux disease)    History of radiation therapy    HOH (hard of hearing)    wears hearing aids   Hyperlipidemia    Hypoglycemia    Lymphocytosis 06/2012   Mastodynia    Murmur    slight   Nasal congestion    Osteoporosis    Osteoporosis    Prostate cancer ([facility])    Sleep apnea    wears CPAP   Urinary retention  with incomplete bladder emptying    pt uses bathroom and within 30 mins needs to go again   Vitamin D deficiency    Past Surgical History:  Procedure Laterality Date   AMPUTATION TOE Right 09/20/2017   Procedure: Right 4th toe amputation;  Surgeon: Wylene Simmer, MD;  Location: [facility];  Service: Orthopedics;  Laterality: Right;  foot block   AMPUTATION TOE Right 02/05/2019   Procedure: RIGHT GREAT TOE AMPUTATION;  Surgeon: Leandrew Koyanagi, MD;  Location: [facility];  Service: Orthopedics;  Laterality: Right;   BACK SURGERY  04/2013   back  surgury     f   BLEPHAROPLASTY Bilateral 02/23/15   CATARACT EXTRACTION     COLONOSCOPY     COLONOSCOPY N/A 10/02/2017   Procedure: COLONOSCOPY;  Surgeon: Doran Stabler, MD;  [facility];  Service: Gastroenterology;  Laterality: N/A;   COLONOSCOPY WITH PROPOFOL N/A 10/01/2017   Procedure: COLONOSCOPY WITH PROPOFOL;  Surgeon: Doran Stabler, MD;  [facility];  Service: Gastroenterology;  Laterality: N/A;   COLONOSCOPY WITH PROPOFOL N/A 03/07/2018   Procedure: COLONOSCOPY WITH PROPOFOL;  Surgeon: [name], MD;  [facility];  Service: Endoscopy;  Laterality: N/A;   ESOPHAGOGASTRODUODENOSCOPY (EGD) WITH PROPOFOL N/A 10/01/2017   Procedure: ESOPHAGOGASTRODUODENOSCOPY (EGD) WITH PROPOFOL;  Surgeon: Doran Stabler, MD;  [facility];  Service: Gastroenterology;  Laterality: N/A;   EYE SURGERY Bilateral    FINGER SURGERY Right    right thumb   HARDWARE REMOVAL Right 05/15/2016   Procedure: Right Lumbar Two-Lumbar Five Removal of Hardware;  Surgeon: Kristeen Miss, MD;  Location: [facility];  Service: Neurosurgery;  Laterality: Right;  Right L2-5 removal of hardware   INGUINAL HERNIA REPAIR     x4   IR RADIOLOGIST EVAL & MGMT  07/10/2018   IR RADIOLOGIST EVAL & MGMT  09/05/2018   IR SACROPLASTY BILATERAL  07/15/2018   REPAIR SPIGELIAN HERNIA  2011   SPINAL FUSION  1996 ,2014   TONSILLECTOMY     UPPER GASTROINTESTINAL ENDOSCOPY     VASECTOMY      Allergies  Allergen Reactions   Celebrex [Celecoxib] Other (See Comments)    Created stomach ulcers.   Flexeril [Cyclobenzaprine] Other (See Comments)    Caught in hiatal hernia and caused extreme pain    Allergies as of 07/14/2021       Reactions   Celebrex [celecoxib] Other (See Comments)   Created stomach ulcers.   Flexeril [cyclobenzaprine] Other (See Comments)   Caught in hiatal hernia and caused extreme pain        Medication List        Accurate as of July 14, 2021   9:35 AM. If you have any questions, ask your nurse or doctor.          acetaminophen 500 MG tablet Commonly known as: TYLENOL Take 500 mg by mouth 3 (three) times daily.   buPROPion 150 MG 12 hr tablet Commonly known as: WELLBUTRIN SR Take 200 mg by mouth 2 (two) times daily.   carbamide peroxide 6.5 % OTIC solution Commonly known as: DEBROX Place 5-10 drops into both ears every 30 (thirty) days. Flush ears every 3 months due to excessive cercumen build up Once A Day on the 15th of Every 3rd Month   carboxymethylcellul-glycerin 0.5-0.9 % ophthalmic solution Commonly known as: REFRESH OPTIVE Place 1 drop into both eyes 3 (three) times daily.   CENTRUM ADULTS  PO Take 1 tablet by mouth daily.   PRESERVISION AREDS 2 PO Take 1 capsule by mouth 2 (two) times daily.   chlorhexidine 0.12 % solution Commonly known as: PERIDEX Use as directed 15 mLs in the mouth or throat at bedtime.   cholecalciferol 1000 units tablet Commonly known as: VITAMIN D Take 1,000 Units by mouth daily.   cyanocobalamin 1000 MCG tablet Take 1,000 mcg by mouth daily.   dextromethorphan-guaiFENesin 30-600 MG 12hr tablet Commonly known as: MUCINEX DM Take 1 tablet by mouth 2 (two) times daily as needed.   diclofenac Sodium 1 % Gel Commonly known as: VOLTAREN Apply 2 g topically 4 (four) times daily.   gabapentin 100 MG capsule Commonly known as: NEURONTIN Take 2 capsules (200 mg total) by mouth 2 (two) times daily.   HYDROcodone-acetaminophen 5-325 MG tablet Commonly known as: NORCO/VICODIN Take one tablet by mouth every 6 hours as needed for moderate pain. Do not exceed 4gm of Tylenol in 24 hours.   lactase 3000 units tablet Commonly known as: LACTAID Take 4,500 Units by mouth 3 (three) times daily with meals.   lovastatin 20 MG tablet Commonly known as: MEVACOR Take 20 mg by mouth at bedtime.   melatonin 5 MG Tabs Take 1 tablet (5 mg total) by mouth at bedtime.   mirtazapine 7.5 MG  tablet Commonly known as: REMERON Take 7.5 mg by mouth at bedtime.   pantoprazole 40 MG tablet Commonly known as: PROTONIX Take 40 mg by mouth every morning.   polyethylene glycol 17 g packet Commonly known as: MIRALAX / GLYCOLAX Take 17 g by mouth daily as needed.   propranolol 10 MG tablet Commonly known as: INDERAL Take 10 mg by mouth daily.   rOPINIRole 1 MG tablet Commonly known as: Requip Take 2 tablets (2 mg total) by mouth at bedtime.   senna 8.6 MG tablet Commonly known as: SENOKOT Take 1 tablet by mouth daily.   zinc oxide 20 % ointment Apply 1 application topically as needed for irritation.        Review of Systems  Constitutional:  Positive for activity change, appetite change and unexpected weight change.  HENT: Negative.    Respiratory: Negative.    Cardiovascular: Negative.   Gastrointestinal: Negative.   Genitourinary: Negative.   Musculoskeletal:  Positive for gait problem.  Skin: Negative.   Neurological:  Positive for weakness.  Psychiatric/Behavioral:  Positive for confusion, dysphoric mood and sleep disturbance.     Immunization History  Administered Date(s) Administered   Influenza, High Dose Seasonal PF 09/17/2019   Influenza-Unspecified 09/16/2018, 09/07/2020   Moderna SARS-COV2 Booster Vaccination 10/18/2020, 05/11/2021   Moderna Sars-Covid-2 Vaccination 12/08/2019, 01/05/2020   Pneumococcal Conjugate-13 12/29/2013   Pneumococcal Polysaccharide-23 09/08/2005   Tdap 05/25/2005, 08/08/2018   Zoster Recombinat (Shingrix) 10/17/2017, 01/25/2018   Pertinent  Health Maintenance Due  Topic Date Due   OPHTHALMOLOGY EXAM  Never done   URINE MICROALBUMIN  Never done   FOOT EXAM  09/28/2019   INFLUENZA VACCINE  07/04/2021   PNA vac Low Risk Adult  Completed   HEMOGLOBIN A1C  Discontinued   Fall Risk  01/01/2019 09/24/2018 01/04/2017 10/06/2016 09/29/2016  Falls in the past year? 0 No No No No   Functional Status Survey:    Vitals:    07/14/21 0913  BP: 123/67  Pulse: 70  Resp: 18  Temp: (!) 97.2 F (36.2 C)  SpO2: 96%  Weight: 140 lb 9.6 oz (63.8 kg)  Height: '5\' 6"'$  (1.676 m)  Body mass index is 22.69 kg/m. Physical Exam Constitutional: Oriented to person, place, and time. Well-developed .  HENT:  Head: Normocephalic.  Mouth/Throat: Oropharynx is clear and moist.  Eyes: Pupils are equal, round, and reactive to light.  Neck: Neck supple.  Cardiovascular: Normal rate and normal heart sounds.  murmur heard. Pulmonary/Chest: Effort normal and breath sounds normal. No respiratory distress. No wheezes.has no rales.  Abdominal: Soft. Bowel sounds are normal. No distension. There is no tenderness. There is no rebound.  Musculoskeletal: Mild Edema Bilateral.  Lymphadenopathy: none Neurological: Alert and oriented to person, place, and time. Wheelchair Bound Skin: Skin is warm and dry.  Psychiatric: Normal mood and affect. Behavior is normal. Thought content normal.    Labs reviewed: Recent Labs    02/14/21 0000 05/16/21 0000 06/08/21 0000  NA 139 138 135*  K 4.2 4.0 4.2  CL 107 106 104  CO2 24* 24* 21  BUN '16 17 17  '$ CREATININE 0.8 0.8 0.6  CALCIUM 9.0 8.8 8.8   Recent Labs    02/14/21 0000 05/16/21 0000 06/08/21 0000  AST 39 32 27  ALT '15 13 11  '$ ALKPHOS 89 93 90  ALBUMIN 3.4* 3.4* 3.4*   Recent Labs    02/14/21 0000 05/16/21 0000 06/08/21 0000  WBC 7.9 7.5 8.3  NEUTROABS 6,012.00 5,460.00 6,574.00  HGB 12.9* 11.8* 12.3*  HCT 40* 36* 37*  PLT 264 258 259   Lab Results  Component Value Date   TSH 2.68 12/10/2020   Lab Results  Component Value Date   HGBA1C 5.2 08/29/2018   Lab Results  Component Value Date   CHOL 158 08/29/2018   HDL 34 (A) 05/06/2020   LDLCALC 103 05/06/2020   TRIG 154 05/06/2020    Significant Diagnostic Results in last 30 days:  No results found.  Assessment/Plan Weight loss Combination of Depression and Dysphagia Not candidate for Aggressive work  up He is now on Regular Diet. His wife thinks that is helping him  Already in Remeron Urinary retention Chronic Foley Cathter Q monthly change Essential tremor On Low Dose of Propanolol Gastroesophageal reflux disease without esophagitis On Protonix Neuropathy Continue Neurontin Depression, recurrent (HCC) High dose  of Wellbutrin and Remeron DYSPHAGIA Regular diet as per his choice RLS (restless legs syndrome) On Requip Hyperlipidemia, unspecified hyperlipidemia type Continue Statin Repeat Lipid Panel OSA Does not use CPAP consistently   Family/ staff Communication:   Labs/tests ordered:    Total time spent in this patient care encounter was  45_  minutes; greater than 50% of the visit spent counseling patient and staff, reviewing records , Labs and coordinating care for problems addressed at this encounter.

## 2021-07-18 DIAGNOSIS — E785 Hyperlipidemia, unspecified: Secondary | ICD-10-CM | POA: Diagnosis not present

## 2021-07-19 DIAGNOSIS — A0472 Enterocolitis due to Clostridium difficile, not specified as recurrent: Secondary | ICD-10-CM | POA: Diagnosis not present

## 2021-07-20 ENCOUNTER — Non-Acute Institutional Stay (INDEPENDENT_AMBULATORY_CARE_PROVIDER_SITE_OTHER): Payer: Medicare Other | Admitting: Orthopedic Surgery

## 2021-07-20 ENCOUNTER — Encounter: Payer: Self-pay | Admitting: Orthopedic Surgery

## 2021-07-20 DIAGNOSIS — Z0000 Encounter for general adult medical examination without abnormal findings: Secondary | ICD-10-CM | POA: Diagnosis not present

## 2021-07-20 DIAGNOSIS — A0472 Enterocolitis due to Clostridium difficile, not specified as recurrent: Secondary | ICD-10-CM | POA: Diagnosis not present

## 2021-07-20 NOTE — Patient Instructions (Signed)
  Charles [name] , Thank you for taking time to come for your Medicare Wellness Visit. I appreciate your ongoing commitment to your health goals. Please review the following plan we discussed and let me know if I can assist you in the future.   These are the goals we discussed:  Goals      Maintain Mobility and Function     Evidence-based guidance:  Emphasize the importance of physical activity and aerobic exercise as included in treatment plan; assess barriers to adherence; consider patient's abilities and preferences.  Encourage gradual increase in activity or exercise instead of stopping if pain occurs.  Reinforce individual therapy exercise prescription, such as strengthening, stabilization and stretching programs.  Promote optimal body mechanics to stabilize the spine with lifting and functional activity.  Encourage activity and mobility modifications to facilitate optimal function, such as using a log roll for bed mobility or dressing from a seated position.  Reinforce individual adaptive equipment recommendations to limit excessive spinal movements, such as a Systems analyst.  Assess adequacy of sleep; encourage use of sleep hygiene techniques, such as bedtime routine; use of white noise; dark, cool bedroom; avoiding daytime naps, heavy meals or exercise before bedtime.  Promote positions and modification to optimize sleep and sexual activity; consider pillows or positioning devices to assist in maintaining neutral spine.  Explore options for applying ergonomic principles at work and home, such as frequent position changes, using ergonomically designed equipment and working at optimal height.  Promote modifications to increase comfort with driving such as lumbar support, optimizing seat and steering wheel position, using cruise control and taking frequent rest stops to stretch and walk.   Notes:         This is a list of the screening recommended for you and due dates:  Health  Maintenance  Topic Date Due   Eye exam for diabetics  Never done   Urine Protein Check  Never done   Complete foot exam   09/28/2019   Flu Shot  07/04/2021   COVID-19 Vaccine (5 - Booster for Moderna series) 09/10/2021   Tetanus Vaccine  08/08/2028   Pneumonia vaccines  Completed   Zoster (Shingles) Vaccine  Completed   HPV Vaccine  Aged Out   Hemoglobin A1C  Discontinued

## 2021-07-20 NOTE — Progress Notes (Signed)
Provider:  Windell Moulding, NP Location:  [facility]   Place of Service:      PCP: [name], MD Patient Care Team: [name], MD as PCP - General (Internal Medicine) [name], MD (Hematology and Oncology) [name], MD as Attending Physician (Urology) [name], MD (Gastroenterology)  Extended Emergency Contact Information Primary Emergency Contact: Rakeem, Howenstine Mobile Phone: [phone] Relation: Son Preferred language: English Interpreter needed? No Secondary Emergency Contact: Panjwani,Janet H Address: [address]          [place]          [place], [place] [address] [name] of [place] Phone: [phone] Mobile Phone: [phone] Relation: Spouse  Code Status: DNR Goals of Care: Advanced Directive information Advanced Directives 07/20/2021  Does Patient Have a Medical Advance Directive? Yes  Type of Paramedic of [place];Living will  Does patient want to make changes to medical advance directive? No - Patient declined  Copy of [place] in Chart? Yes - validated most recent copy scanned in chart (See row information)  Would patient like information on creating a medical advance directive? -  Pre-existing out of facility DNR order (yellow form or pink MOST form) -     Chief Complaint  Patient presents with   Medicare Wellness    Annual wellness exam     HPI: Patient is a 87 y.o. male seen today for an annual comprehensive examination.  Past Medical History:  Diagnosis Date   Anemia    Arthritis    AVM (arteriovenous malformation)    Barrett's esophagus    BPH (benign prostatic hyperplasia)    Cataract    Constipation    CPAP (continuous positive airway pressure) dependence    Depression    Diverticulosis    diverticular bleeding   Diverticulosis    GERD (gastroesophageal reflux disease)    History of radiation therapy    HOH (hard of hearing)    wears  hearing aids   Hyperlipidemia    Hypoglycemia    Lymphocytosis 06/2012   Mastodynia    Murmur    slight   Nasal congestion    Osteoporosis    Osteoporosis    Prostate cancer ([facility])    Sleep apnea    wears CPAP   Urinary retention with incomplete bladder emptying    pt uses bathroom and within 30 mins needs to go again   Vitamin D deficiency    Past Surgical History:  Procedure Laterality Date   AMPUTATION TOE Right 09/20/2017   Procedure: Right 4th toe amputation;  Surgeon: Wylene Simmer, MD;  Location: [place] [facility];  Service: Orthopedics;  Laterality: Right;  foot block   AMPUTATION TOE Right 02/05/2019   Procedure: RIGHT GREAT TOE AMPUTATION;  Surgeon: Leandrew Koyanagi, MD;  Location: [facility] [facility];  Service: Orthopedics;  Laterality: Right;   BACK SURGERY  04/2013   back surgury     f   BLEPHAROPLASTY Bilateral 02/23/15   CATARACT EXTRACTION     COLONOSCOPY     COLONOSCOPY N/A 10/02/2017   Procedure: COLONOSCOPY;  Surgeon: Doran Stabler, MD;  Location: [facility];  Service: Gastroenterology;  Laterality: N/A;   COLONOSCOPY WITH PROPOFOL N/A 10/01/2017   Procedure: COLONOSCOPY WITH PROPOFOL;  Surgeon: Doran Stabler, MD;  Location: [facility];  Service: Gastroenterology;  Laterality: N/A;   COLONOSCOPY WITH PROPOFOL N/A 03/07/2018   Procedure: COLONOSCOPY WITH PROPOFOL;  Surgeon: [name], MD;  Location: [facility];  Service: Endoscopy;  Laterality: N/A;   ESOPHAGOGASTRODUODENOSCOPY (EGD) WITH PROPOFOL N/A 10/01/2017   Procedure: ESOPHAGOGASTRODUODENOSCOPY (EGD) WITH PROPOFOL;  Surgeon: Doran Stabler, MD;  Location: [facility];  Service: Gastroenterology;  Laterality: N/A;   EYE SURGERY Bilateral    FINGER SURGERY Right    right thumb   HARDWARE REMOVAL Right 05/15/2016   Procedure: Right Lumbar Two-Lumbar Five Removal of Hardware;  Surgeon: Kristeen Miss, MD;  Location: [facility];  Service: Neurosurgery;  Laterality: Right;   Right L2-5 removal of hardware   INGUINAL HERNIA REPAIR     x4   IR RADIOLOGIST EVAL & MGMT  07/10/2018   IR RADIOLOGIST EVAL & MGMT  09/05/2018   IR SACROPLASTY BILATERAL  07/15/2018   REPAIR SPIGELIAN HERNIA  2011   SPINAL FUSION  1996 ,2014   TONSILLECTOMY     UPPER GASTROINTESTINAL ENDOSCOPY     VASECTOMY      reports that he has never smoked. He has never used smokeless tobacco. He reports that he does not drink alcohol and does not use drugs. Social History   Socioeconomic History   Marital status: Married    Spouse name: [name]   Number of children: 3   Years of education: College   Highest education level: Not on file  Occupational History   Occupation: Retired    Fish farm manager: RETIRED    Comment: retired Arboriculturist  Tobacco Use   Smoking status: Never   Smokeless tobacco: Never  Vaping Use   Vaping Use: Never used  Substance and Sexual Activity   Alcohol use: No   Drug use: No   Sexual activity: Not Currently  Other Topics Concern   Not on file  Social History Narrative   Patient is married [name]).   Patient is a retired Arboriculturist.   Patient lives in [facility] living facility.   Patient has three adult children.   Patient does not drink any caffeine.   Patient is right-handed.   Social Determinants of Health   Financial Resource Strain: Not on file  Food Insecurity: Not on file  Transportation Needs: Not on file  Physical Activity: Not on file  Stress: Not on file  Social Connections: Not on file  Intimate Partner Violence: Not on file   Family History  Problem Relation Age of Onset   Prostate cancer Father    CAD Mother    Arthritis-Osteo Brother    Colon cancer Neg Hx    Stomach cancer Neg Hx     Pertinent  Health Maintenance Due  Topic Date Due   OPHTHALMOLOGY EXAM  Never done   URINE MICROALBUMIN  Never done   FOOT EXAM  09/28/2019   INFLUENZA VACCINE  07/04/2021   PNA vac Low Risk Adult  Completed   HEMOGLOBIN A1C  Discontinued   Fall  Risk  01/01/2019 09/24/2018 01/04/2017 10/06/2016 09/29/2016  Falls in the past year? 0 No No No No   Depression screen [facility] 2/9 09/24/2018 01/04/2017  Decreased Interest 0 0  Down, Depressed, Hopeless 0 0  PHQ - 2 Score 0 0  Some recent data might be hidden    Functional Status Survey:    Allergies  Allergen Reactions   Celebrex [Celecoxib] Other (See Comments)    Created stomach ulcers.   Flexeril [Cyclobenzaprine] Other (See Comments)    Caught in hiatal hernia and caused extreme pain    Allergies as of 07/20/2021  Reactions   Celebrex [celecoxib] Other (See Comments)   Created stomach ulcers.   Flexeril [cyclobenzaprine] Other (See Comments)   Caught in hiatal hernia and caused extreme pain        Medication List        Accurate as of July 20, 2021 10:59 AM. If you have any questions, ask your nurse or doctor.          acetaminophen 500 MG tablet Commonly known as: TYLENOL Take 500 mg by mouth 3 (three) times daily.   buPROPion 150 MG 12 hr tablet Commonly known as: WELLBUTRIN SR Take 200 mg by mouth 2 (two) times daily.   carbamide peroxide 6.5 % OTIC solution Commonly known as: DEBROX Place 5-10 drops into both ears every 30 (thirty) days. Flush ears every 3 months due to excessive cercumen build up Once A Day on the 15th of Every 3rd Month   carboxymethylcellul-glycerin 0.5-0.9 % ophthalmic solution Commonly known as: REFRESH OPTIVE Place 1 drop into both eyes 3 (three) times daily.   CENTRUM ADULTS PO Take 1 tablet by mouth daily.   PRESERVISION AREDS 2 PO Take 1 capsule by mouth 2 (two) times daily.   chlorhexidine 0.12 % solution Commonly known as: PERIDEX Use as directed 15 mLs in the mouth or throat at bedtime.   cholecalciferol 1000 units tablet Commonly known as: VITAMIN D Take 1,000 Units by mouth daily.   cyanocobalamin 1000 MCG tablet Take 1,000 mcg by mouth daily.   dextromethorphan-guaiFENesin 30-600 MG 12hr  tablet Commonly known as: MUCINEX DM Take 1 tablet by mouth 2 (two) times daily as needed.   diclofenac Sodium 1 % Gel Commonly known as: VOLTAREN Apply 2 g topically 4 (four) times daily.   gabapentin 100 MG capsule Commonly known as: NEURONTIN Take 2 capsules (200 mg total) by mouth 2 (two) times daily.   HYDROcodone-acetaminophen 5-325 MG tablet Commonly known as: NORCO/VICODIN Take one tablet by mouth every 6 hours as needed for moderate pain. Do not exceed 4gm of Tylenol in 24 hours.   lactase 3000 units tablet Commonly known as: LACTAID Take 4,500 Units by mouth 3 (three) times daily with meals.   lovastatin 20 MG tablet Commonly known as: MEVACOR Take 20 mg by mouth at bedtime.   melatonin 5 MG Tabs Take 1 tablet (5 mg total) by mouth at bedtime.   mirtazapine 7.5 MG tablet Commonly known as: REMERON Take 7.5 mg by mouth at bedtime.   pantoprazole 40 MG tablet Commonly known as: PROTONIX Take 40 mg by mouth every morning.   polyethylene glycol 17 g packet Commonly known as: MIRALAX / GLYCOLAX Take 17 g by mouth daily as needed.   propranolol 10 MG tablet Commonly known as: INDERAL Take 10 mg by mouth daily.   rOPINIRole 1 MG tablet Commonly known as: Requip Take 2 tablets (2 mg total) by mouth at bedtime.   senna 8.6 MG tablet Commonly known as: SENOKOT Take 1 tablet by mouth daily.   zinc oxide 20 % ointment Apply 1 application topically as needed for irritation.        Review of Systems  Vitals:   07/20/21 1055  BP: 117/64  Pulse: 65  Resp: 20  Temp: (!) 97.2 F (36.2 C)  SpO2: 95%  Weight: 140 lb 9.6 oz (63.8 kg)  Height: '5\' 6"'$  (1.676 m)   Body mass index is 22.69 kg/m. Physical Exam  Labs reviewed: Basic Metabolic Panel: Recent Labs    02/14/21 0000 05/16/21  0000 06/08/21 0000  NA 139 138 135*  K 4.2 4.0 4.2  CL 107 106 104  CO2 24* 24* 21  BUN '16 17 17  '$ CREATININE 0.8 0.8 0.6  CALCIUM 9.0 8.8 8.8   Liver Function  Tests: Recent Labs    02/14/21 0000 05/16/21 0000 06/08/21 0000  AST 39 32 27  ALT '15 13 11  '$ ALKPHOS 89 93 90  ALBUMIN 3.4* 3.4* 3.4*   No results for input(s): LIPASE, AMYLASE in the last 8760 hours. No results for input(s): AMMONIA in the last 8760 hours. CBC: Recent Labs    02/14/21 0000 05/16/21 0000 06/08/21 0000  WBC 7.9 7.5 8.3  NEUTROABS 6,012.00 5,460.00 6,574.00  HGB 12.9* 11.8* 12.3*  HCT 40* 36* 37*  PLT 264 258 259   Cardiac Enzymes: No results for input(s): CKTOTAL, CKMB, CKMBINDEX, TROPONINI in the last 8760 hours. BNP: Invalid input(s): POCBNP Lab Results  Component Value Date   HGBA1C 5.2 08/29/2018   Lab Results  Component Value Date   TSH 2.68 12/10/2020   Lab Results  Component Value Date   [id number] 03/01/2021   Lab Results  Component Value Date   FOLATE >24.8 07/25/2013   Lab Results  Component Value Date   IRON 19 (L) 07/25/2013   FERRITIN 8.1 (L) 07/25/2013    Imaging and Procedures obtained recently: No results found.  Assessment/Plan There are no diagnoses linked to this encounter.   Family/ staff Communication:   Labs/tests ordered:

## 2021-07-20 NOTE — Progress Notes (Signed)
Subjective:   Charles [name] is a 87 y.o. male who presents for Medicare Annual/Subsequent preventive examination.  Review of Systems     Cardiac Risk Factors include: advanced age (>42mn, >>55women);male gender;hypertension;sedentary lifestyle     Objective:    Today's Vitals   07/20/21 1055  BP: 117/64  Pulse: 65  Resp: 20  Temp: (!) 97.2 F (36.2 C)  SpO2: 95%  Weight: 140 lb 9.6 oz (63.8 kg)  Height: '5\' 6"'$  (1.676 m)   Body mass index is 22.69 kg/m.  Advanced Directives 07/20/2021 07/14/2021 06/10/2021 06/07/2021 05/13/2021 05/06/2021 04/13/2021  Does Patient Have a Medical Advance Directive? Yes Yes Yes Yes Yes Yes Yes  Type of AParamedicof AAngel FireLiving will HLewisLiving will;Out of facility DNR (pink MOST or yellow form) HMediaLiving will;Out of facility DNR (pink MOST or yellow form) HHills and DalesLiving will;Out of facility DNR (pink MOST or yellow form) HMcDadeLiving will;Out of facility DNR (pink MOST or yellow form) HStarkvilleLiving will;Out of facility DNR (pink MOST or yellow form) HNordicLiving will  Does patient want to make changes to medical advance directive? No - Patient declined No - Patient declined No - Patient declined No - Patient declined No - Patient declined No - Patient declined No - Patient declined  Copy of HHenricoin Chart? Yes - validated most recent copy scanned in chart (See row information) Yes - validated most recent copy scanned in chart (See row information) Yes - validated most recent copy scanned in chart (See row information) Yes - validated most recent copy scanned in chart (See row information) Yes - validated most recent copy scanned in chart (See row information) Yes - validated most recent copy scanned in chart (See row information) Yes - validated most recent copy scanned  in chart (See row information)  Would patient like information on creating a medical advance directive? - - - - - - -  Pre-existing out of facility DNR order (yellow form or pink MOST form) - Yellow form placed in chart (order not valid for inpatient use);Pink MOST form placed in chart (order not valid for inpatient use) Yellow form placed in chart (order not valid for inpatient use);Pink MOST form placed in chart (order not valid for inpatient use) Yellow form placed in chart (order not valid for inpatient use);Pink MOST form placed in chart (order not valid for inpatient use) Yellow form placed in chart (order not valid for inpatient use);Pink MOST form placed in chart (order not valid for inpatient use) Yellow form placed in chart (order not valid for inpatient use);Pink MOST form placed in chart (order not valid for inpatient use) Yellow form placed in chart (order not valid for inpatient use);Pink MOST form placed in chart (order not valid for inpatient use)    Current Medications (verified) Outpatient Encounter Medications as of 07/20/2021  Medication Sig   acetaminophen (TYLENOL) 500 MG tablet Take 500 mg by mouth 3 (three) times daily.    buPROPion (WELLBUTRIN SR) 150 MG 12 hr tablet Take 200 mg by mouth 2 (two) times daily.   carbamide peroxide (DEBROX) 6.5 % OTIC solution Place 5-10 drops into both ears every 30 (thirty) days. Flush ears every 3 months due to excessive cercumen build up Once A Day on the 15th of Every 3rd Month   carboxymethylcellul-glycerin (REFRESH OPTIVE) 0.5-0.9 % ophthalmic solution Place 1 drop into both  eyes 3 (three) times daily.   chlorhexidine (PERIDEX) 0.12 % solution Use as directed 15 mLs in the mouth or throat at bedtime.   cholecalciferol (VITAMIN D) 1000 UNITS tablet Take 1,000 Units by mouth daily.   cyanocobalamin 1000 MCG tablet Take 1,000 mcg by mouth daily.   dextromethorphan-guaiFENesin (MUCINEX DM) 30-600 MG 12hr tablet Take 1 tablet by mouth 2 (two)  times daily as needed.   diclofenac Sodium (VOLTAREN) 1 % GEL Apply 2 g topically 4 (four) times daily.   gabapentin (NEURONTIN) 100 MG capsule Take 2 capsules (200 mg total) by mouth 2 (two) times daily.   HYDROcodone-acetaminophen (NORCO/VICODIN) 5-325 MG tablet Take one tablet by mouth every 6 hours as needed for moderate pain. Do not exceed 4gm of Tylenol in 24 hours.   lactase (LACTAID) 3000 units tablet Take 4,500 Units by mouth 3 (three) times daily with meals.    lovastatin (MEVACOR) 20 MG tablet Take 20 mg by mouth at bedtime.    melatonin 5 MG TABS Take 1 tablet (5 mg total) by mouth at bedtime.   mirtazapine (REMERON) 7.5 MG tablet Take 7.5 mg by mouth at bedtime.   Multiple Vitamins-Minerals (CENTRUM ADULTS PO) Take 1 tablet by mouth daily.   Multiple Vitamins-Minerals (PRESERVISION AREDS 2 PO) Take 1 capsule by mouth 2 (two) times daily.   pantoprazole (PROTONIX) 40 MG tablet Take 40 mg by mouth every morning.    polyethylene glycol (MIRALAX / GLYCOLAX) 17 g packet Take 17 g by mouth daily as needed.   propranolol (INDERAL) 10 MG tablet Take 10 mg by mouth daily.    rOPINIRole (REQUIP) 1 MG tablet Take 2 tablets (2 mg total) by mouth at bedtime.   senna (SENOKOT) 8.6 MG tablet Take 1 tablet by mouth daily.   zinc oxide 20 % ointment Apply 1 application topically as needed for irritation.   No facility-administered encounter medications on file as of 07/20/2021.    Allergies (verified) Celebrex [celecoxib] and Flexeril [cyclobenzaprine]   History: Past Medical History:  Diagnosis Date   Anemia    Arthritis    AVM (arteriovenous malformation)    Barrett's esophagus    BPH (benign prostatic hyperplasia)    Cataract    Constipation    CPAP (continuous positive airway pressure) dependence    Depression    Diverticulosis    diverticular bleeding   Diverticulosis    GERD (gastroesophageal reflux disease)    History of radiation therapy    HOH (hard of hearing)    wears  hearing aids   Hyperlipidemia    Hypoglycemia    Lymphocytosis 06/2012   Mastodynia    Murmur    slight   Nasal congestion    Osteoporosis    Osteoporosis    Prostate cancer ([place])    Sleep apnea    wears CPAP   Urinary retention with incomplete bladder emptying    pt uses bathroom and within 30 mins needs to go again   Vitamin D deficiency    Past Surgical History:  Procedure Laterality Date   AMPUTATION TOE Right 09/20/2017   Procedure: Right 4th toe amputation;  Surgeon: Wylene Simmer, MD;  Location: [place];  Service: Orthopedics;  Laterality: Right;  foot block   AMPUTATION TOE Right 02/05/2019   Procedure: RIGHT GREAT TOE AMPUTATION;  Surgeon: Leandrew Koyanagi, MD;  Location: [place];  Service: Orthopedics;  Laterality: Right;   BACK SURGERY  04/2013   back surgury  f   BLEPHAROPLASTY Bilateral 02/23/15   CATARACT EXTRACTION     COLONOSCOPY     COLONOSCOPY N/A 10/02/2017   Procedure: COLONOSCOPY;  Surgeon: Doran Stabler, MD;  Location: [facility];  Service: Gastroenterology;  Laterality: N/A;   COLONOSCOPY WITH PROPOFOL N/A 10/01/2017   Procedure: COLONOSCOPY WITH PROPOFOL;  Surgeon: Doran Stabler, MD;  Location: [facility];  Service: Gastroenterology;  Laterality: N/A;   COLONOSCOPY WITH PROPOFOL N/A 03/07/2018   Procedure: COLONOSCOPY WITH PROPOFOL;  Surgeon: [name], MD;  Location: [facility];  Service: Endoscopy;  Laterality: N/A;   ESOPHAGOGASTRODUODENOSCOPY (EGD) WITH PROPOFOL N/A 10/01/2017   Procedure: ESOPHAGOGASTRODUODENOSCOPY (EGD) WITH PROPOFOL;  Surgeon: Doran Stabler, MD;  Location: [facility];  Service: Gastroenterology;  Laterality: N/A;   EYE SURGERY Bilateral    FINGER SURGERY Right    right thumb   HARDWARE REMOVAL Right 05/15/2016   Procedure: Right Lumbar Two-Lumbar Five Removal of Hardware;  Surgeon: Kristeen Miss, MD;  Location: [facility];  Service: Neurosurgery;  Laterality: Right;   Right L2-5 removal of hardware   INGUINAL HERNIA REPAIR     x4   IR RADIOLOGIST EVAL & MGMT  07/10/2018   IR RADIOLOGIST EVAL & MGMT  09/05/2018   IR SACROPLASTY BILATERAL  07/15/2018   REPAIR SPIGELIAN HERNIA  2011   SPINAL FUSION  1996 ,2014   TONSILLECTOMY     UPPER GASTROINTESTINAL ENDOSCOPY     VASECTOMY     Family History  Problem Relation Age of Onset   Prostate cancer Father    CAD Mother    Arthritis-Osteo Brother    Colon cancer Neg Hx    Stomach cancer Neg Hx    Social History   Socioeconomic History   Marital status: Married    Spouse name: [name]   Number of children: 3   Years of education: [employer] education level: Not on file  Occupational History   Occupation: Retired    Fish farm manager: RETIRED    Comment: retired Arboriculturist  Tobacco Use   Smoking status: Never   Smokeless tobacco: Never  Vaping Use   Vaping Use: Never used  Substance and Sexual Activity   Alcohol use: No   Drug use: No   Sexual activity: Not Currently  Other Topics Concern   Not on file  Social History Narrative   Patient is married [name]).   Patient is a retired Arboriculturist.   Patient lives in [place] living facility.   Patient has three adult children.   Patient does not drink any caffeine.   Patient is right-handed.   Social Determinants of Health   Financial Resource Strain: Low Risk    Difficulty of Paying Living Expenses: Not very hard  Food Insecurity: No Food Insecurity   Worried About Charity fundraiser in the Last Year: Never true   Ran Out of Food in the Last Year: Never true  Transportation Needs: No Transportation Needs   Lack of Transportation (Medical): No   Lack of Transportation (Non-Medical): No  Physical Activity: Inactive   Days of Exercise per Week: 0 days   Minutes of Exercise per Session: 0 min  Stress: No Stress Concern Present   Feeling of Stress : Not at all  Social Connections: Moderately Isolated   Frequency of Communication with Friends  and Family: More than three times a week   Frequency of Social Gatherings with Friends and Family: More than three times a week  Attends Religious Services: Never   Active Member of Clubs or Organizations: No   Attends Archivist Meetings: Never   Marital Status: Married    Tobacco Counseling Counseling given: Not Answered   Clinical Intake:  Pre-visit preparation completed: Yes  Pain : No/denies pain  BMI - recorded: 22.69 Nutritional Status: BMI of 19-24  Normal Nutritional Risks: Unintentional weight loss, Failure to thrive (due to recent depression and excessive daytime sleepiness) Diabetes: No  How often do you need to have someone help you when you read instructions, pamphlets, or other written materials from your doctor or pharmacy?: 3 - Sometimes What is the last grade level you completed in school?: 4 year college  Diabetic?No  Interpreter Needed?: No      Activities of Daily Living In your present state of health, do you have any difficulty performing the following activities: 07/20/2021  Hearing? Y  Comment uses hearing aids  Vision? N  Difficulty concentrating or making decisions? N  Walking or climbing stairs? Y  Comment ambulates with wheelchair  Dressing or bathing? Y  Comment needs assistance  Doing errands, shopping? Y  Preparing Food and eating ? Y  Using the Toilet? Y  In the past six months, have you accidently leaked urine? N  Comment foley catheter  Managing your Medications? Y  Managing your Finances? Y  Housekeeping or managing your Housekeeping? Y  Some recent data might be hidden    Patient Care Team: [name], MD as PCP - General (Internal Medicine) [name], MD (Hematology and Oncology) [name], MD as Attending Physician (Urology) [name], MD (Gastroenterology)  Indicate any recent Medical Services you may have received from other than [facility] providers in the past year (date may be  approximate).     Assessment:   This is a routine wellness examination for [facility].  Hearing/Vision screen No results found.  Dietary issues and exercise activities discussed: Current Exercise Habits: The patient does not participate in regular exercise at present, Exercise limited by: neurologic condition(s);orthopedic condition(s);psychological condition(s)   Goals Addressed             This Visit's Progress    Maintain Mobility and Function   Not on track    Evidence-based guidance:  Emphasize the importance of physical activity and aerobic exercise as included in treatment plan; assess barriers to adherence; consider patient's abilities and preferences.  Encourage gradual increase in activity or exercise instead of stopping if pain occurs.  Reinforce individual therapy exercise prescription, such as strengthening, stabilization and stretching programs.  Promote optimal body mechanics to stabilize the spine with lifting and functional activity.  Encourage activity and mobility modifications to facilitate optimal function, such as using a log roll for bed mobility or dressing from a seated position.  Reinforce individual adaptive equipment recommendations to limit excessive spinal movements, such as a Systems analyst.  Assess adequacy of sleep; encourage use of sleep hygiene techniques, such as bedtime routine; use of white noise; dark, cool bedroom; avoiding daytime naps, heavy meals or exercise before bedtime.  Promote positions and modification to optimize sleep and sexual activity; consider pillows or positioning devices to assist in maintaining neutral spine.  Explore options for applying ergonomic principles at work and home, such as frequent position changes, using ergonomically designed equipment and working at optimal height.  Promote modifications to increase comfort with driving such as lumbar support, optimizing seat and steering wheel position, using cruise control  and taking frequent rest stops  to stretch and walk.   Notes:        Depression Screen PHQ 2/9 Scores 07/20/2021 09/24/2018 01/04/2017 10/06/2016 09/29/2016 09/22/2016 07/19/2016  PHQ - 2 Score 2 0 0 0 0 0 0  PHQ- 9 Score 6 - - - - - -    Fall Risk Fall Risk  07/20/2021 01/01/2019 09/24/2018 01/04/2017 10/06/2016  Falls in the past year? 1 0 No No No  Number falls in past yr: 0 - - - -  Injury with Fall? 0 - - - -  Risk for fall due to : History of fall(s);Impaired balance/gait;Impaired mobility - - - -  Follow up Falls evaluation completed;Education provided;Falls prevention discussed - - - -    FALL RISK PREVENTION PERTAINING TO THE HOME:  Any stairs in or around the home? No  If so, are there any without handrails? No  Home free of loose throw rugs in walkways, pet beds, electrical cords, etc? Yes  Adequate lighting in your home to reduce risk of falls? Yes   ASSISTIVE DEVICES UTILIZED TO PREVENT FALLS:  Life alert? No  Use of a cane, walker or w/c? Yes  Grab bars in the bathroom? Yes  Shower chair or bench in shower? Yes  Elevated toilet seat or a handicapped toilet? Yes   TIMED UP AND GO:  Was the test performed? No .  Length of time to ambulate 10 feet: UTA sec.   Gait unsteady without use of assistive device, provider informed and interventions were implemented  Cognitive Function: MMSE - Mini Mental State Exam 07/20/2021 09/24/2018  Not completed: Refused -  Orientation to time - 5  Orientation to Place - 5  Registration - 3  Attention/ Calculation - 4  Recall - 2  Language- name 2 objects - 2  Language- repeat - 1  Language- follow 3 step command - 3  Language- read & follow direction - 1  Write a sentence - 1  Copy design - 0  Total score - 27   Montreal Cognitive Assessment  06/20/2016  Visuospatial/ Executive (0/5) 5  Naming (0/3) 3  Attention: Read list of digits (0/2) 2  Attention: Read list of letters (0/1) 1  Attention: Serial 7 subtraction starting  at 100 (0/3) 3  Language: Repeat phrase (0/2) 2  Language : Fluency (0/1) 1  Abstraction (0/2) 2  Delayed Recall (0/5) 2  Orientation (0/6) 5  Total 26  Adjusted Score (based on education) 26   6CIT Screen 07/20/2021  What Year? 0 points  What month? 3 points  What time? 0 points  Count back from 20 2 points  Months in reverse 2 points  Repeat phrase 0 points  Total Score 7    Immunizations Immunization History  Administered Date(s) Administered   Influenza, High Dose Seasonal PF 09/17/2019   Influenza-Unspecified 09/16/2018, 09/07/2020   Moderna SARS-COV2 Booster Vaccination 10/18/2020, 05/11/2021   Moderna Sars-Covid-2 Vaccination 12/08/2019, 01/05/2020   Pneumococcal Conjugate-13 12/29/2013   Pneumococcal Polysaccharide-23 09/08/2005   Tdap 05/25/2005, 08/08/2018   Zoster Recombinat (Shingrix) 10/17/2017, 01/25/2018    TDAP status: Up to date  Flu Vaccine status: Due, Education has been provided regarding the importance of this vaccine. Advised may receive this vaccine at local pharmacy or Health Dept. Aware to provide a copy of the vaccination record if obtained from local pharmacy or Health Dept. Verbalized acceptance and understanding.  Pneumococcal vaccine status: Up to date  Covid-19 vaccine status: Completed vaccines  Qualifies for Shingles Vaccine? Yes  Zostavax completed No   Shingrix Completed?: Yes  Screening Tests Health Maintenance  Topic Date Due   OPHTHALMOLOGY EXAM  Never done   URINE MICROALBUMIN  Never done   FOOT EXAM  09/28/2019   INFLUENZA VACCINE  07/04/2021   COVID-19 Vaccine (5 - Booster for Moderna series) 09/10/2021   TETANUS/TDAP  08/08/2028   PNA vac Low Risk Adult  Completed   Zoster Vaccines- Shingrix  Completed   HPV VACCINES  Aged Out   HEMOGLOBIN A1C  Discontinued    Health Maintenance  Health Maintenance Due  Topic Date Due   OPHTHALMOLOGY EXAM  Never done   URINE MICROALBUMIN  Never done   FOOT EXAM  09/28/2019    INFLUENZA VACCINE  07/04/2021    Colorectal cancer screening: No longer required.   Lung Cancer Screening: (Low Dose CT Chest recommended if Age 19-80 years, 30 pack-year currently smoking OR have quit w/in 15years.) does not qualify.   Lung Cancer Screening Referral: No  Additional Screening:  Hepatitis C Screening: does not qualify; Completed advanced age  Vision Screening: Recommended annual ophthalmology exams for early detection of glaucoma and other disorders of the eye. Is the patient up to date with their annual eye exam?  Yes  Who is the provider or what is the name of the office in which the patient attends annual eye exams? N/A If pt is not established with a provider, would they like to be referred to a provider to establish care? No .   Dental Screening: Recommended annual dental exams for proper oral hygiene  Community Resource Referral / Chronic Care Management: CRR required this visit?  No   CCM required this visit?  No      Plan:     I have personally reviewed and noted the following in the patient's chart:   Medical and social history Use of alcohol, tobacco or illicit drugs  Current medications and supplements including opioid prescriptions. Patient is currently taking opioid prescriptions. Information provided to patient regarding non-opioid alternatives. Patient advised to discuss non-opioid treatment plan with their provider. Functional ability and status Nutritional status Physical activity Advanced directives List of other physicians Hospitalizations, surgeries, and ER visits in previous 12 months Vitals Screenings to include cognitive, depression, and falls Referrals and appointments  In addition, I have reviewed and discussed with patient certain preventive protocols, quality metrics, and best practice recommendations. A written personalized care plan for preventive services as well as general preventive health recommendations were provided to  patient.     Yvonna Alanis, NP   07/20/2021

## 2021-07-23 DIAGNOSIS — A0472 Enterocolitis due to Clostridium difficile, not specified as recurrent: Secondary | ICD-10-CM | POA: Diagnosis not present

## 2021-07-27 DIAGNOSIS — A0472 Enterocolitis due to Clostridium difficile, not specified as recurrent: Secondary | ICD-10-CM | POA: Diagnosis not present

## 2021-08-01 DIAGNOSIS — A0472 Enterocolitis due to Clostridium difficile, not specified as recurrent: Secondary | ICD-10-CM | POA: Diagnosis not present

## 2021-08-03 DIAGNOSIS — A0472 Enterocolitis due to Clostridium difficile, not specified as recurrent: Secondary | ICD-10-CM | POA: Diagnosis not present

## 2021-08-04 DIAGNOSIS — Z960 Presence of urogenital implants: Secondary | ICD-10-CM | POA: Diagnosis not present

## 2021-08-04 DIAGNOSIS — M5459 Other low back pain: Secondary | ICD-10-CM | POA: Diagnosis not present

## 2021-08-04 DIAGNOSIS — M6281 Muscle weakness (generalized): Secondary | ICD-10-CM | POA: Diagnosis not present

## 2021-08-04 DIAGNOSIS — R1312 Dysphagia, oropharyngeal phase: Secondary | ICD-10-CM | POA: Diagnosis not present

## 2021-08-05 DIAGNOSIS — M6281 Muscle weakness (generalized): Secondary | ICD-10-CM | POA: Diagnosis not present

## 2021-08-05 DIAGNOSIS — M5459 Other low back pain: Secondary | ICD-10-CM | POA: Diagnosis not present

## 2021-08-05 DIAGNOSIS — R1312 Dysphagia, oropharyngeal phase: Secondary | ICD-10-CM | POA: Diagnosis not present

## 2021-08-05 DIAGNOSIS — Z960 Presence of urogenital implants: Secondary | ICD-10-CM | POA: Diagnosis not present

## 2021-08-09 DIAGNOSIS — R1312 Dysphagia, oropharyngeal phase: Secondary | ICD-10-CM | POA: Diagnosis not present

## 2021-08-09 DIAGNOSIS — M6281 Muscle weakness (generalized): Secondary | ICD-10-CM | POA: Diagnosis not present

## 2021-08-09 DIAGNOSIS — M5459 Other low back pain: Secondary | ICD-10-CM | POA: Diagnosis not present

## 2021-08-09 DIAGNOSIS — Z960 Presence of urogenital implants: Secondary | ICD-10-CM | POA: Diagnosis not present

## 2021-08-11 DIAGNOSIS — R1312 Dysphagia, oropharyngeal phase: Secondary | ICD-10-CM | POA: Diagnosis not present

## 2021-08-11 DIAGNOSIS — M6281 Muscle weakness (generalized): Secondary | ICD-10-CM | POA: Diagnosis not present

## 2021-08-11 DIAGNOSIS — Z960 Presence of urogenital implants: Secondary | ICD-10-CM | POA: Diagnosis not present

## 2021-08-11 DIAGNOSIS — M5459 Other low back pain: Secondary | ICD-10-CM | POA: Diagnosis not present

## 2021-08-16 DIAGNOSIS — R1312 Dysphagia, oropharyngeal phase: Secondary | ICD-10-CM | POA: Diagnosis not present

## 2021-08-16 DIAGNOSIS — M5459 Other low back pain: Secondary | ICD-10-CM | POA: Diagnosis not present

## 2021-08-16 DIAGNOSIS — Z960 Presence of urogenital implants: Secondary | ICD-10-CM | POA: Diagnosis not present

## 2021-08-16 DIAGNOSIS — M6281 Muscle weakness (generalized): Secondary | ICD-10-CM | POA: Diagnosis not present

## 2021-08-17 ENCOUNTER — Encounter: Payer: Self-pay | Admitting: Orthopedic Surgery

## 2021-08-17 ENCOUNTER — Non-Acute Institutional Stay (SKILLED_NURSING_FACILITY): Payer: Medicare Other | Admitting: Orthopedic Surgery

## 2021-08-17 DIAGNOSIS — F339 Major depressive disorder, recurrent, unspecified: Secondary | ICD-10-CM | POA: Diagnosis not present

## 2021-08-17 DIAGNOSIS — G4733 Obstructive sleep apnea (adult) (pediatric): Secondary | ICD-10-CM

## 2021-08-17 DIAGNOSIS — K219 Gastro-esophageal reflux disease without esophagitis: Secondary | ICD-10-CM | POA: Diagnosis not present

## 2021-08-17 DIAGNOSIS — G2581 Restless legs syndrome: Secondary | ICD-10-CM

## 2021-08-17 DIAGNOSIS — Z9989 Dependence on other enabling machines and devices: Secondary | ICD-10-CM

## 2021-08-17 DIAGNOSIS — R531 Weakness: Secondary | ICD-10-CM | POA: Diagnosis not present

## 2021-08-17 DIAGNOSIS — R634 Abnormal weight loss: Secondary | ICD-10-CM

## 2021-08-17 DIAGNOSIS — E785 Hyperlipidemia, unspecified: Secondary | ICD-10-CM | POA: Diagnosis not present

## 2021-08-17 DIAGNOSIS — R1319 Other dysphagia: Secondary | ICD-10-CM

## 2021-08-17 DIAGNOSIS — R339 Retention of urine, unspecified: Secondary | ICD-10-CM

## 2021-08-17 NOTE — Progress Notes (Signed)
Location:   [facility] Number: [place] of Service:  SNF [phone]) Provider:  Windell Moulding, NP    Patient Care Team: [name], MD as PCP - General (Internal Medicine) [name], MD (Hematology and Oncology) [name], MD as Attending Physician (Urology) [name], MD (Gastroenterology)  Extended Emergency Contact Information Primary Emergency Contact: Hoarce, Pelfrey Mobile Phone: [phone] Relation: Son Preferred language: English Interpreter needed? No Secondary Emergency Contact: Ingalsbe,Janet H Address: [address]          [address]          [place], [place] [address] [name] of [place] Phone: [phone] Mobile Phone: [phone] Relation: Spouse  Code Status:  DNR Goals of care: Advanced Directive information Advanced Directives 08/17/2021  Does Patient Have a Medical Advance Directive? Yes  Type of Paramedic of [place];Living will;Out of facility DNR (pink MOST or yellow form)  Does patient want to make changes to medical advance directive? No - Patient declined  Copy of [place] in Chart? Yes - validated most recent copy scanned in chart (See row information)  Would patient like information on creating a medical advance directive? -  Pre-existing out of facility DNR order (yellow form or pink MOST form) -     Chief Complaint  Patient presents with   Medical Management of Chronic Issues    Routine Visit.    Health Maintenance    Discuss the need for Eye exam, Foot exam, and Urine Microalbumin.   Immunizations    Discuss the need for Influenza vaccine.     HPI:  Pt is a 87 y.o. male seen today for medical management of chronic diseases.    He currently resides in the skilled nursing unit at [facility]. Past medical history includes: OSA, neuropathy, REM sleep behavior disorder, essential tremor, GERD, depression, constipation,  hyperlipidemia, left foot contracture, and adult failure to thrive.  Weight loss- lost 20 lbs within the past 4 months, weight has started to rebound and he is beginning to gain weight, remains on Remeron Weakness- he is non ambulatory now, uses wheelchair full time, he had been working with OT to hopefully have a PWC, but OT/ family and DON do not recommend due to poor safety awareness OSA- more alert today, wife reports he has been more energetic during day, using CPAP more consistently Urinary retention- indwelling foley with normal output GERD- hgb 12.3 06/08/2021, no increased reflux, remains on Protonix Depression- stable with Wellbutrin and Remeron RLS- remains on requip HLD- LDL 80 07/18/2021, on lovastatin  He is trying to schedule dermatology appointment with Dr. Jarome Matin. Reports he has not been seen in 3 years.   No recent falls, injuries or behavioral outbursts. Ambulates with wheelchair.   Recent blood pressures:  09/13- 128/71  09/06- 126/65  08/30- 134/72   Recent weights:  09/14- 146.1 lbs  08/17- 143.2 lbs  07/13- 135.2 lbs  Nurse does not report any concerns, vitals stable.   He is scheduled to receive flu vaccine and covid booster within the next month per [facility].     Past Medical History:  Diagnosis Date   Anemia    Arthritis    AVM (arteriovenous malformation)    Barrett's esophagus    BPH (benign prostatic hyperplasia)    Cataract    Constipation    CPAP (continuous positive airway pressure) dependence    Depression  Diverticulosis    diverticular bleeding   Diverticulosis    GERD (gastroesophageal reflux disease)    History of radiation therapy    HOH (hard of hearing)    wears hearing aids   Hyperlipidemia    Hypoglycemia    Lymphocytosis 06/2012   Mastodynia    Murmur    slight   Nasal congestion    Osteoporosis    Osteoporosis    Prostate cancer ([place])    Sleep apnea    wears CPAP   Urinary retention with incomplete  bladder emptying    pt uses bathroom and within 30 mins needs to go again   Vitamin D deficiency    Past Surgical History:  Procedure Laterality Date   AMPUTATION TOE Right 09/20/2017   Procedure: Right 4th toe amputation;  Surgeon: Wylene Simmer, MD;  Location: [facility] [place];  Service: Orthopedics;  Laterality: Right;  foot block   AMPUTATION TOE Right 02/05/2019   Procedure: RIGHT GREAT TOE AMPUTATION;  Surgeon: Leandrew Koyanagi, MD;  Location: [place] [facility];  Service: Orthopedics;  Laterality: Right;   BACK SURGERY  04/2013   back surgury     f   BLEPHAROPLASTY Bilateral 02/23/15   CATARACT EXTRACTION     COLONOSCOPY     COLONOSCOPY N/A 10/02/2017   Procedure: COLONOSCOPY;  Surgeon: Doran Stabler, MD;  Location: [facility];  Service: Gastroenterology;  Laterality: N/A;   COLONOSCOPY WITH PROPOFOL N/A 10/01/2017   Procedure: COLONOSCOPY WITH PROPOFOL;  Surgeon: Doran Stabler, MD;  Location: [facility];  Service: Gastroenterology;  Laterality: N/A;   COLONOSCOPY WITH PROPOFOL N/A 03/07/2018   Procedure: COLONOSCOPY WITH PROPOFOL;  Surgeon: [name], MD;  Location: [facility];  Service: Endoscopy;  Laterality: N/A;   ESOPHAGOGASTRODUODENOSCOPY (EGD) WITH PROPOFOL N/A 10/01/2017   Procedure: ESOPHAGOGASTRODUODENOSCOPY (EGD) WITH PROPOFOL;  Surgeon: Doran Stabler, MD;  Location: [facility];  Service: Gastroenterology;  Laterality: N/A;   EYE SURGERY Bilateral    FINGER SURGERY Right    right thumb   HARDWARE REMOVAL Right 05/15/2016   Procedure: Right Lumbar Two-Lumbar Five Removal of Hardware;  Surgeon: Kristeen Miss, MD;  Location: [facility];  Service: Neurosurgery;  Laterality: Right;  Right L2-5 removal of hardware   INGUINAL HERNIA REPAIR     x4   IR RADIOLOGIST EVAL & MGMT  07/10/2018   IR RADIOLOGIST EVAL & MGMT  09/05/2018   IR SACROPLASTY BILATERAL  07/15/2018   REPAIR SPIGELIAN HERNIA  2011   SPINAL FUSION  1996 ,2014    TONSILLECTOMY     UPPER GASTROINTESTINAL ENDOSCOPY     VASECTOMY      Allergies  Allergen Reactions   Celebrex [Celecoxib] Other (See Comments)    Created stomach ulcers.   Flexeril [Cyclobenzaprine] Other (See Comments)    Caught in hiatal hernia and caused extreme pain    Allergies as of 08/17/2021       Reactions   Celebrex [celecoxib] Other (See Comments)   Created stomach ulcers.   Flexeril [cyclobenzaprine] Other (See Comments)   Caught in hiatal hernia and caused extreme pain        Medication List        Accurate as of August 17, 2021 10:08 AM. If you have any questions, ask your nurse or doctor.          acetaminophen 500 MG tablet Commonly known as: TYLENOL Take 500 mg by mouth 3 (three) times daily.  buPROPion 200 MG 12 hr tablet Commonly known as: WELLBUTRIN SR Take 200 mg by mouth 2 (two) times daily. What changed: Another medication with the same name was removed. Continue taking this medication, and follow the directions you see here. Changed by: Yvonna Alanis, NP   carbamide peroxide 6.5 % OTIC solution Commonly known as: DEBROX Place 5-10 drops into both ears every 30 (thirty) days. Flush ears every 3 months due to excessive cercumen build up Once A Day on the 15th of Every 3rd Month   carboxymethylcellul-glycerin 0.5-0.9 % ophthalmic solution Commonly known as: REFRESH OPTIVE Place 1 drop into both eyes 3 (three) times daily.   CENTRUM ADULTS PO Take 1 tablet by mouth daily.   PRESERVISION AREDS 2 PO Take 1 capsule by mouth 2 (two) times daily.   chlorhexidine 0.12 % solution Commonly known as: PERIDEX Use as directed 15 mLs in the mouth or throat at bedtime.   cholecalciferol 1000 units tablet Commonly known as: VITAMIN D Take 1,000 Units by mouth daily.   cyanocobalamin 1000 MCG tablet Take 1,000 mcg by mouth daily.   dextromethorphan-guaiFENesin 30-600 MG 12hr tablet Commonly known as: MUCINEX DM Take 1 tablet by mouth 2  (two) times daily as needed.   diclofenac Sodium 1 % Gel Commonly known as: VOLTAREN Apply 2 g topically 4 (four) times daily.   gabapentin 100 MG capsule Commonly known as: NEURONTIN Take 2 capsules (200 mg total) by mouth 2 (two) times daily.   HYDROcodone-acetaminophen 5-325 MG tablet Commonly known as: NORCO/VICODIN Take one tablet by mouth every 6 hours as needed for moderate pain. Do not exceed 4gm of Tylenol in 24 hours.   lactase 3000 units tablet Commonly known as: LACTAID Take 4,500 Units by mouth 3 (three) times daily with meals.   lovastatin 20 MG tablet Commonly known as: MEVACOR Take 20 mg by mouth at bedtime.   melatonin 5 MG Tabs Take 1 tablet (5 mg total) by mouth at bedtime.   mirtazapine 7.5 MG tablet Commonly known as: REMERON Take 7.5 mg by mouth at bedtime.   pantoprazole 40 MG tablet Commonly known as: PROTONIX Take 40 mg by mouth every morning.   polyethylene glycol 17 g packet Commonly known as: MIRALAX / GLYCOLAX Take 17 g by mouth daily as needed.   propranolol 10 MG tablet Commonly known as: INDERAL Take 10 mg by mouth daily.   rOPINIRole 1 MG tablet Commonly known as: Requip Take 2 tablets (2 mg total) by mouth at bedtime.   senna 8.6 MG tablet Commonly known as: SENOKOT Take 1 tablet by mouth daily.   zinc oxide 20 % ointment Apply 1 application topically as needed for irritation.        Review of Systems  Constitutional:  Negative for activity change, appetite change, fatigue and fever.  HENT:  Positive for hearing loss and trouble swallowing. Negative for congestion and dental problem.        Bilateral hearing aids  Eyes:  Negative for visual disturbance.       Glasses  Respiratory:  Negative for cough, shortness of breath and wheezing.   Cardiovascular:  Negative for chest pain and leg swelling.  Gastrointestinal:  Positive for constipation. Negative for abdominal distention, abdominal pain, blood in stool, diarrhea and  nausea.  Genitourinary:  Negative for hematuria.       Indwelling foley  Musculoskeletal:  Positive for arthralgias, gait problem and myalgias.  Skin:        Hx of  skin cancer  Neurological:  Positive for tremors and weakness. Negative for dizziness and headaches.  Psychiatric/Behavioral:  Positive for dysphoric mood. Negative for confusion, sleep disturbance and suicidal ideas. The patient is not nervous/anxious.    Immunization History  Administered Date(s) Administered   Influenza, High Dose Seasonal PF 09/17/2019   Influenza-Unspecified 09/16/2018, 09/07/2020   Moderna SARS-COV2 Booster Vaccination 10/18/2020, 05/11/2021   Moderna Sars-Covid-2 Vaccination 12/08/2019, 01/05/2020   Pneumococcal Conjugate-13 12/29/2013   Pneumococcal Polysaccharide-23 09/08/2005   Tdap 05/25/2005, 08/08/2018   Zoster Recombinat (Shingrix) 10/17/2017, 01/25/2018   Pertinent  Health Maintenance Due  Topic Date Due   OPHTHALMOLOGY EXAM  Never done   URINE MICROALBUMIN  Never done   FOOT EXAM  09/28/2019   INFLUENZA VACCINE  07/04/2021   PNA vac Low Risk Adult  Completed   HEMOGLOBIN A1C  Discontinued   Fall Risk  07/20/2021 01/01/2019 09/24/2018 01/04/2017 10/06/2016  Falls in the past year? 1 0 No No No  Number falls in past yr: 0 - - - -  Injury with Fall? 0 - - - -  Risk for fall due to : History of fall(s);Impaired balance/gait;Impaired mobility - - - -  Follow up Falls evaluation completed;Education provided;Falls prevention discussed - - - -   Functional Status Survey:    Vitals:   08/17/21 0944  BP: 128/71  Pulse: 71  Resp: 20  Temp: 97.7 F (36.5 C)  SpO2: 97%  Weight: 142 lb 1.6 oz (64.5 kg)  Height: '5\' 6"'$  (1.676 m)   Body mass index is 22.94 kg/m. Physical Exam Vitals reviewed.  Constitutional:      General: He is not in acute distress. HENT:     Head: Normocephalic.     Right Ear: There is no impacted cerumen.     Left Ear: There is no impacted cerumen.     Nose: Nose  normal.     Mouth/Throat:     Mouth: Mucous membranes are moist.  Eyes:     General:        Right eye: No discharge.        Left eye: No discharge.  Neck:     Vascular: No carotid bruit.  Cardiovascular:     Rate and Rhythm: Normal rate and regular rhythm.     Pulses:          Dorsalis pedis pulses are 1+ on the right side and 1+ on the left side.       Posterior tibial pulses are 1+ on the right side and 1+ on the left side.     Heart sounds: Murmur heard.  Pulmonary:     Effort: Pulmonary effort is normal. No respiratory distress.     Breath sounds: Normal breath sounds. No wheezing.  Abdominal:     General: Bowel sounds are normal. There is no distension.     Palpations: Abdomen is soft.     Tenderness: There is no abdominal tenderness.  Musculoskeletal:     Cervical back: Normal range of motion.     Right lower leg: No edema.     Left lower leg: No edema.     Right foot: Normal range of motion. Deformity present.     Left foot: Normal range of motion.     Comments: Right toe amputation, skin with mild redness, non tender, no sign of skin breakdown. Remains in post-op shoe during day.   Feet:     Right foot:     Protective Sensation: 10  sites tested.  10 sites sensed.     Skin integrity: Skin integrity normal.     Toenail Condition: Right toenails are normal.     Left foot:     Protective Sensation: 10 sites tested.  10 sites sensed.     Skin integrity: Skin integrity normal.     Toenail Condition: Left toenails are normal.     Comments: Right toe amputation Lymphadenopathy:     Cervical: No cervical adenopathy.  Neurological:     Mental Status: He is alert.    Labs reviewed: Recent Labs    02/14/21 0000 05/16/21 0000 06/08/21 0000  NA 139 138 135*  K 4.2 4.0 4.2  CL 107 106 104  CO2 24* 24* 21  BUN '16 17 17  '$ CREATININE 0.8 0.8 0.6  CALCIUM 9.0 8.8 8.8   Recent Labs    02/14/21 0000 05/16/21 0000 06/08/21 0000  AST 39 32 27  ALT '15 13 11  '$ ALKPHOS  89 93 90  ALBUMIN 3.4* 3.4* 3.4*   Recent Labs    02/14/21 0000 05/16/21 0000 06/08/21 0000  WBC 7.9 7.5 8.3  NEUTROABS 6,012.00 5,460.00 6,574.00  HGB 12.9* 11.8* 12.3*  HCT 40* 36* 37*  PLT 264 258 259   Lab Results  Component Value Date   TSH 2.68 12/10/2020   Lab Results  Component Value Date   HGBA1C 5.2 08/29/2018   Lab Results  Component Value Date   CHOL 158 08/29/2018   HDL 34 (A) 05/06/2020   LDLCALC 103 05/06/2020   TRIG 154 05/06/2020    Significant Diagnostic Results in last 30 days:  No results found.  Assessment/Plan 1. Weight loss - improved, gained 3 lbs within last month - weight appears to be trending up - cont Remeron  2. Weakness - non- ambulatory within last 8 weeks - remains in wheelchair most of day  3. OSA on CPAP - he is using CPAP consistently - wife reports he is more energetic  4. Urinary retention - indwelling foley present - cont monthly changes  5. Gastroesophageal reflux disease without esophagitis - stable with Protonix  6. Depression, recurrent (HCC) - cont Wellbutrin and Remeron  7. RLS (restless legs syndrome) - stable with Requip  8. Hyperlipidemia, unspecified hyperlipidemia type - LDL 80 07/18/2021 - cont lovastatin  9. DYSPHAGIA - no recent aspirations - declined dysphagia diet few months back - remains on regular diet at this time    Family/ staff Communication: plan discussed with patient and nurse  Labs/tests ordered:  none

## 2021-08-18 DIAGNOSIS — Z960 Presence of urogenital implants: Secondary | ICD-10-CM | POA: Diagnosis not present

## 2021-08-18 DIAGNOSIS — M6281 Muscle weakness (generalized): Secondary | ICD-10-CM | POA: Diagnosis not present

## 2021-08-18 DIAGNOSIS — R1312 Dysphagia, oropharyngeal phase: Secondary | ICD-10-CM | POA: Diagnosis not present

## 2021-08-18 DIAGNOSIS — M5459 Other low back pain: Secondary | ICD-10-CM | POA: Diagnosis not present

## 2021-08-22 DIAGNOSIS — R1312 Dysphagia, oropharyngeal phase: Secondary | ICD-10-CM | POA: Diagnosis not present

## 2021-08-22 DIAGNOSIS — M5459 Other low back pain: Secondary | ICD-10-CM | POA: Diagnosis not present

## 2021-08-22 DIAGNOSIS — Z960 Presence of urogenital implants: Secondary | ICD-10-CM | POA: Diagnosis not present

## 2021-08-22 DIAGNOSIS — M6281 Muscle weakness (generalized): Secondary | ICD-10-CM | POA: Diagnosis not present

## 2021-08-23 ENCOUNTER — Encounter: Payer: Self-pay | Admitting: Orthopedic Surgery

## 2021-08-23 ENCOUNTER — Non-Acute Institutional Stay (SKILLED_NURSING_FACILITY): Payer: Medicare Other | Admitting: Orthopedic Surgery

## 2021-08-23 DIAGNOSIS — R1319 Other dysphagia: Secondary | ICD-10-CM | POA: Diagnosis not present

## 2021-08-23 DIAGNOSIS — R531 Weakness: Secondary | ICD-10-CM

## 2021-08-23 DIAGNOSIS — Z9989 Dependence on other enabling machines and devices: Secondary | ICD-10-CM

## 2021-08-23 DIAGNOSIS — R197 Diarrhea, unspecified: Secondary | ICD-10-CM | POA: Diagnosis not present

## 2021-08-23 DIAGNOSIS — G4733 Obstructive sleep apnea (adult) (pediatric): Secondary | ICD-10-CM

## 2021-08-23 DIAGNOSIS — R634 Abnormal weight loss: Secondary | ICD-10-CM | POA: Diagnosis not present

## 2021-08-23 DIAGNOSIS — F339 Major depressive disorder, recurrent, unspecified: Secondary | ICD-10-CM

## 2021-08-23 DIAGNOSIS — I10 Essential (primary) hypertension: Secondary | ICD-10-CM | POA: Diagnosis not present

## 2021-08-23 DIAGNOSIS — R0689 Other abnormalities of breathing: Secondary | ICD-10-CM

## 2021-08-23 LAB — HEPATIC FUNCTION PANEL
ALT: 11 (ref 10–40)
AST: 29 (ref 14–40)
Alkaline Phosphatase: 99 (ref 25–125)
Bilirubin, Total: 0.4

## 2021-08-23 LAB — COMPREHENSIVE METABOLIC PANEL
Albumin: 3.1 — AB (ref 3.5–5.0)
Calcium: 8.3 — AB (ref 8.7–10.7)
Globulin: 3.3

## 2021-08-23 LAB — CBC AND DIFFERENTIAL
HCT: 34 — AB (ref 41–53)
Hemoglobin: 11 — AB (ref 13.5–17.5)
Neutrophils Absolute: 14907
Platelets: 238 (ref 150–399)
WBC: 16.6

## 2021-08-23 LAB — BASIC METABOLIC PANEL
BUN: 24 — AB (ref 4–21)
CO2: 21 (ref 13–22)
Chloride: 106 (ref 99–108)
Creatinine: 0.9 (ref 0.6–1.3)
Glucose: 151
Potassium: 3.3 — AB (ref 3.4–5.3)
Sodium: 137 (ref 137–147)

## 2021-08-23 LAB — CBC: RBC: 3.77 — AB (ref 3.87–5.11)

## 2021-08-23 NOTE — Progress Notes (Signed)
Location:  [facility] Room Number: [place] of Service:  SNF [phone]) Provider:  Windell Moulding, AGNP-C  [name], MD  Patient Care Team: [name], MD as PCP - General (Internal Medicine) [name], MD (Hematology and Oncology) [name], MD as Attending Physician (Urology) [name], MD (Gastroenterology)  Extended Emergency Contact Information Primary Emergency Contact: Braidan, Ricciardi Mobile Phone: [phone] Relation: Son Preferred language: English Interpreter needed? No Secondary Emergency Contact: Henson,Janet H Address: [address]          [address]          [place], [place] [address] [name] of [place] Phone: [phone] Mobile Phone: [phone] Relation: Spouse  Code Status:  DNR Goals of care: Advanced Directive information Advanced Directives 08/17/2021  Does Patient Have a Medical Advance Directive? Yes  Type of Paramedic of [place];Living will;Out of facility DNR (pink MOST or yellow form)  Does patient want to make changes to medical advance directive? No - Patient declined  Copy of [place] in Chart? Yes - validated most recent copy scanned in chart (See row information)  Would patient like information on creating a medical advance directive? -  Pre-existing out of facility DNR order (yellow form or pink MOST form) -     Chief Complaint  Patient presents with   Acute Visit    Diarrhea, fatigue    HPI:  Pt is a 87 y.o. male seen today for acute visit due to diarrhea and fatigue.   Over the weekend, nursing reports 2 episodes of diarrhea. He was given imodium twice and diarrhea subsided. Small BM this morning, described as soft. He has a history of c.diff. He denies abdominal pain, changes in stool or blood.   He is also becomingly progressively weaker. He was able to ambulate earlier this year. He stopped walking with a walker and started  using a wheelchair over the summer. Also one person assist with transfers. In the past week nurse aid reports he is needing a hoyer lift for transfers, especially in the evening. He is also not leaving his room often. Wife upset he has not made it to the dining room for a week. He was interested in getting his license for [place], but unfortunately he did not quality per OT. At this time, he is having a custom wheelchair made to help with posture.   In the past 24 hrs, he is sleeping more. He has a history of OSA and REM sleep disorder. He reports staff did not help him with his CPAP last night and that is why he is tired. During our encounter he falls asleep after a few minutes of talking. He is easily aroused. Responds appropriately.   No recent falls or injuries.   Recent blood pressures:  09/13- 128/71  09/06- 126/65  08/30- 134/72  Recent weights:  09/14- 146.1 lbs  08/17- 143.2 lbs  07/13- 135.3 lbs  Nurse does not report any other concerns, vitals stable.  Past Medical History:  Diagnosis Date   Anemia    Arthritis    AVM (arteriovenous malformation)    Barrett's esophagus    BPH (benign prostatic hyperplasia)    Cataract    Constipation    CPAP (continuous positive airway pressure) dependence    Depression    Diverticulosis    diverticular bleeding   Diverticulosis    GERD (gastroesophageal reflux disease)    History of radiation  therapy    HOH (hard of hearing)    wears hearing aids   Hyperlipidemia    Hypoglycemia    Lymphocytosis 06/2012   Mastodynia    Murmur    slight   Nasal congestion    Osteoporosis    Osteoporosis    Prostate cancer ([place])    Sleep apnea    wears CPAP   Urinary retention with incomplete bladder emptying    pt uses bathroom and within 30 mins needs to go again   Vitamin D deficiency    Past Surgical History:  Procedure Laterality Date   AMPUTATION TOE Right 09/20/2017   Procedure: Right 4th toe amputation;  Surgeon: Wylene Simmer, MD;   Location: [facility];  Service: Orthopedics;  Laterality: Right;  foot block   AMPUTATION TOE Right 02/05/2019   Procedure: RIGHT GREAT TOE AMPUTATION;  Surgeon: Leandrew Koyanagi, MD;  Location: [facility];  Service: Orthopedics;  Laterality: Right;   BACK SURGERY  04/2013   back surgury     f   BLEPHAROPLASTY Bilateral 02/23/15   CATARACT EXTRACTION     COLONOSCOPY     COLONOSCOPY N/A 10/02/2017   Procedure: COLONOSCOPY;  Surgeon: Doran Stabler, MD;  Location: [facility];  Service: Gastroenterology;  Laterality: N/A;   COLONOSCOPY WITH PROPOFOL N/A 10/01/2017   Procedure: COLONOSCOPY WITH PROPOFOL;  Surgeon: Doran Stabler, MD;  Location: [facility];  Service: Gastroenterology;  Laterality: N/A;   COLONOSCOPY WITH PROPOFOL N/A 03/07/2018   Procedure: COLONOSCOPY WITH PROPOFOL;  Surgeon: [name], MD;  Location: [facility];  Service: Endoscopy;  Laterality: N/A;   ESOPHAGOGASTRODUODENOSCOPY (EGD) WITH PROPOFOL N/A 10/01/2017   Procedure: ESOPHAGOGASTRODUODENOSCOPY (EGD) WITH PROPOFOL;  Surgeon: Doran Stabler, MD;  Location: [facility];  Service: Gastroenterology;  Laterality: N/A;   EYE SURGERY Bilateral    FINGER SURGERY Right    right thumb   HARDWARE REMOVAL Right 05/15/2016   Procedure: Right Lumbar Two-Lumbar Five Removal of Hardware;  Surgeon: Kristeen Miss, MD;  Location: [facility];  Service: Neurosurgery;  Laterality: Right;  Right L2-5 removal of hardware   INGUINAL HERNIA REPAIR     x4   IR RADIOLOGIST EVAL & MGMT  07/10/2018   IR RADIOLOGIST EVAL & MGMT  09/05/2018   IR SACROPLASTY BILATERAL  07/15/2018   REPAIR SPIGELIAN HERNIA  2011   SPINAL FUSION  1996 ,2014   TONSILLECTOMY     UPPER GASTROINTESTINAL ENDOSCOPY     VASECTOMY      Allergies  Allergen Reactions   Celebrex [Celecoxib] Other (See Comments)    Created stomach ulcers.   Flexeril [Cyclobenzaprine] Other (See Comments)    Caught in hiatal hernia and caused  extreme pain    Outpatient Encounter Medications as of 08/23/2021  Medication Sig   acetaminophen (TYLENOL) 500 MG tablet Take 500 mg by mouth 3 (three) times daily.    buPROPion (WELLBUTRIN SR) 200 MG 12 hr tablet Take 200 mg by mouth 2 (two) times daily.   carbamide peroxide (DEBROX) 6.5 % OTIC solution Place 5-10 drops into both ears every 30 (thirty) days. Flush ears every 3 months due to excessive cercumen build up Once A Day on the 15th of Every 3rd Month   carboxymethylcellul-glycerin (REFRESH OPTIVE) 0.5-0.9 % ophthalmic solution Place 1 drop into both eyes 3 (three) times daily.   chlorhexidine (PERIDEX) 0.12 % solution Use as directed 15 mLs in the mouth or throat at bedtime.  cholecalciferol (VITAMIN D) 1000 UNITS tablet Take 1,000 Units by mouth daily.   cyanocobalamin 1000 MCG tablet Take 1,000 mcg by mouth daily.   dextromethorphan-guaiFENesin (MUCINEX DM) 30-600 MG 12hr tablet Take 1 tablet by mouth 2 (two) times daily as needed.   diclofenac Sodium (VOLTAREN) 1 % GEL Apply 2 g topically 4 (four) times daily.   gabapentin (NEURONTIN) 100 MG capsule Take 2 capsules (200 mg total) by mouth 2 (two) times daily.   HYDROcodone-acetaminophen (NORCO/VICODIN) 5-325 MG tablet Take one tablet by mouth every 6 hours as needed for moderate pain. Do not exceed 4gm of Tylenol in 24 hours.   lactase (LACTAID) 3000 units tablet Take 4,500 Units by mouth 3 (three) times daily with meals.    lovastatin (MEVACOR) 20 MG tablet Take 20 mg by mouth at bedtime.    melatonin 5 MG TABS Take 1 tablet (5 mg total) by mouth at bedtime.   mirtazapine (REMERON) 7.5 MG tablet Take 7.5 mg by mouth at bedtime.   Multiple Vitamins-Minerals (CENTRUM ADULTS PO) Take 1 tablet by mouth daily.   Multiple Vitamins-Minerals (PRESERVISION AREDS 2 PO) Take 1 capsule by mouth 2 (two) times daily.   pantoprazole (PROTONIX) 40 MG tablet Take 40 mg by mouth every morning.    polyethylene glycol (MIRALAX / GLYCOLAX) 17 g  packet Take 17 g by mouth daily as needed.   propranolol (INDERAL) 10 MG tablet Take 10 mg by mouth daily.    rOPINIRole (REQUIP) 1 MG tablet Take 2 tablets (2 mg total) by mouth at bedtime.   senna (SENOKOT) 8.6 MG tablet Take 1 tablet by mouth daily.   zinc oxide 20 % ointment Apply 1 application topically as needed for irritation.   No facility-administered encounter medications on file as of 08/23/2021.    Review of Systems  Constitutional:  Positive for fatigue. Negative for activity change, appetite change, chills and fever.  HENT:  Positive for hearing loss and trouble swallowing. Negative for congestion.   Eyes:  Negative for visual disturbance.       Glasses  Respiratory:  Negative for cough, shortness of breath and wheezing.   Cardiovascular:  Negative for chest pain and leg swelling.  Gastrointestinal:  Positive for diarrhea. Negative for abdominal distention, abdominal pain, blood in stool, constipation, nausea and rectal pain.  Genitourinary:  Negative for dysuria, frequency and hematuria.       Foley  Musculoskeletal:  Positive for arthralgias, gait problem and myalgias.  Skin: Negative.   Neurological:  Positive for weakness. Negative for dizziness, light-headedness and headaches.  Psychiatric/Behavioral:  Positive for dysphoric mood and sleep disturbance. Negative for confusion. The patient is not nervous/anxious.    Immunization History  Administered Date(s) Administered   Influenza, High Dose Seasonal PF 09/17/2019   Influenza-Unspecified 09/16/2018, 09/07/2020   Moderna SARS-COV2 Booster Vaccination 10/18/2020, 05/11/2021   Moderna Sars-Covid-2 Vaccination 12/08/2019, 01/05/2020   Pneumococcal Conjugate-13 12/29/2013   Pneumococcal Polysaccharide-23 09/08/2005   Tdap 05/25/2005, 08/08/2018   Zoster Recombinat (Shingrix) 10/17/2017, 01/25/2018   Pertinent  Health Maintenance Due  Topic Date Due   OPHTHALMOLOGY EXAM  Never done   INFLUENZA VACCINE  07/04/2021    FOOT EXAM  08/17/2022   HEMOGLOBIN A1C  Discontinued   URINE MICROALBUMIN  Discontinued   Fall Risk  07/20/2021 01/01/2019 09/24/2018 01/04/2017 10/06/2016  Falls in the past year? 1 0 No No No  Number falls in past yr: 0 - - - -  Injury with Fall? 0 - - - -  Risk for fall due to : History of fall(s);Impaired balance/gait;Impaired mobility - - - -  Follow up Falls evaluation completed;Education provided;Falls prevention discussed - - - -   Functional Status Survey:    Vitals:   08/23/21 1534  BP: 128/71  Pulse: 71  Resp: (!) 71  Temp: (!) 97.2 F (36.2 C)  SpO2: 95%  Weight: 146 lb 1.6 oz (66.3 kg)  Height: 5\' 6"  (1.676 m)   Body mass index is 23.58 kg/m. Physical Exam Vitals reviewed.  Constitutional:      General: He is not in acute distress. HENT:     Head: Normocephalic.     Right Ear: There is no impacted cerumen.     Left Ear: There is no impacted cerumen.     Nose: Nose normal.     Mouth/Throat:     Mouth: Mucous membranes are moist.  Eyes:     General:        Right eye: No discharge.        Left eye: No discharge.  Neck:     Vascular: No carotid bruit.  Cardiovascular:     Rate and Rhythm: Normal rate and regular rhythm.     Pulses: Normal pulses.     Heart sounds: Normal heart sounds. No murmur heard. Pulmonary:     Effort: Pulmonary effort is normal. No respiratory distress.     Breath sounds: No stridor. Examination of the right-lower field reveals decreased breath sounds. Examination of the left-lower field reveals decreased breath sounds. Decreased breath sounds present.  Abdominal:     General: Bowel sounds are normal. There is no distension.     Palpations: Abdomen is soft.     Tenderness: There is no abdominal tenderness.  Genitourinary:    Comments: Urine yellow and clear, no odor Musculoskeletal:     Cervical back: Normal range of motion.     Right lower leg: No edema.     Left lower leg: No edema.  Lymphadenopathy:     Cervical: No cervical  adenopathy.  Skin:    General: Skin is warm and dry.     Capillary Refill: Capillary refill takes less than 2 seconds.  Neurological:     General: No focal deficit present.     Mental Status: He is alert and oriented to person, place, and time.     Motor: Weakness present.     Gait: Gait abnormal.     Comments: wheelchair  Psychiatric:        Mood and Affect: Mood normal. Affect is flat.        Behavior: Behavior normal.    Labs reviewed: Recent Labs    02/14/21 0000 05/16/21 0000 06/08/21 0000  NA 139 138 135*  K 4.2 4.0 4.2  CL 107 106 104  CO2 24* 24* 21  BUN 16 17 17   CREATININE 0.8 0.8 0.6  CALCIUM 9.0 8.8 8.8   Recent Labs    02/14/21 0000 05/16/21 0000 06/08/21 0000  AST 39 32 27  ALT 15 13 11   ALKPHOS 89 93 90  ALBUMIN 3.4* 3.4* 3.4*   Recent Labs    02/14/21 0000 05/16/21 0000 06/08/21 0000  WBC 7.9 7.5 8.3  NEUTROABS 6,012.00 5,460.00 6,574.00  HGB 12.9* 11.8* 12.3*  HCT 40* 36* 37*  PLT 264 258 259   Lab Results  Component Value Date   TSH 2.68 12/10/2020   Lab Results  Component Value Date   HGBA1C 5.2 08/29/2018   Lab  Results  Component Value Date   CHOL 158 08/29/2018   HDL 34 (A) 05/06/2020   LDLCALC 103 05/06/2020   TRIG 154 05/06/2020    Significant Diagnostic Results in last 30 days:  No results found.  Assessment/Plan 1. Diarrhea, unspecified type - 2 episodes this weekend- improved with 2 doses imodium - soft BM today - history of c.diff earlier this year - recommend test stool for c.diff if diarrhea persists  2. OSA on CPAP - reports inconsistencies regarding help with CPAP - reports he did not use last night, was not offered by staff  3. Weakness - ongoing, ambulated with walker earlier this year - needing hoyer lift in evenings x 1 week - OT on hold until custom wheelchair made - cbc/diff - cmp  4. Decreased lung sounds - diminished lung sounds at bases of lungs - CXR  5. Weight loss - gained 9 lbs in  past 2 months - cont remeron  6. Depression, recurrent ([place]) - flat affect today - declining health contributing factor  7. Dysphagia - no recent aspirations - remains on regular diet - ST recommended nectar thick liquids- refused by patient  Family/ staff Communication: plan discussed with patient and nurse  Labs/tests ordered:  CXR, cbc/diff, cmp

## 2021-08-24 ENCOUNTER — Encounter: Payer: Self-pay | Admitting: Orthopedic Surgery

## 2021-08-24 ENCOUNTER — Non-Acute Institutional Stay (SKILLED_NURSING_FACILITY): Payer: Medicare Other | Admitting: Orthopedic Surgery

## 2021-08-24 DIAGNOSIS — R0689 Other abnormalities of breathing: Secondary | ICD-10-CM | POA: Diagnosis not present

## 2021-08-24 DIAGNOSIS — E876 Hypokalemia: Secondary | ICD-10-CM | POA: Diagnosis not present

## 2021-08-24 DIAGNOSIS — R627 Adult failure to thrive: Secondary | ICD-10-CM

## 2021-08-24 DIAGNOSIS — R5383 Other fatigue: Secondary | ICD-10-CM

## 2021-08-24 DIAGNOSIS — R5381 Other malaise: Secondary | ICD-10-CM | POA: Diagnosis not present

## 2021-08-24 DIAGNOSIS — R197 Diarrhea, unspecified: Secondary | ICD-10-CM

## 2021-08-24 DIAGNOSIS — R8290 Unspecified abnormal findings in urine: Secondary | ICD-10-CM | POA: Diagnosis not present

## 2021-08-24 DIAGNOSIS — N390 Urinary tract infection, site not specified: Secondary | ICD-10-CM | POA: Diagnosis not present

## 2021-08-24 DIAGNOSIS — M6281 Muscle weakness (generalized): Secondary | ICD-10-CM | POA: Diagnosis not present

## 2021-08-24 NOTE — Progress Notes (Signed)
Location:  [facility] Room Number: [place] of Service:  SNF [phone]) Provider:  Windell Moulding, AGNP-C  [name], MD  Patient Care Team: [name], MD as PCP - General (Internal Medicine) [name], MD (Hematology and Oncology) [name], MD as Attending Physician (Urology) [name], MD (Gastroenterology)  Extended Emergency Contact Information Primary Emergency Contact: Shermaine, Brigham Mobile Phone: [phone] Relation: Son Preferred language: English Interpreter needed? No Secondary Emergency Contact: Denn,Janet H Address: [address]          [address]          [place], [place] [address] [name] of [place] Phone: [phone] Mobile Phone: [phone] Relation: Spouse  Code Status:  DNR Goals of care: Advanced Directive information Advanced Directives 08/17/2021  Does Patient Have a Medical Advance Directive? Yes  Type of Paramedic of [place];Living will;Out of facility DNR (pink MOST or yellow form)  Does patient want to make changes to medical advance directive? No - Patient declined  Copy of [place] in Chart? Yes - validated most recent copy scanned in chart (See row information)  Would patient like information on creating a medical advance directive? -  Pre-existing out of facility DNR order (yellow form or pink MOST form) -     Chief Complaint  Patient presents with   Acute Visit    HPI:  Pt is a 87 y.o. male seen today for acute visit due to elevated WBC.   He was initially seen yesterday for diarrhea and progressive weakness. Diarrhea has since subsided with 2 doses imodium. In the past week staff reports using hoyer lift in the evening. He appeared very tired and fell asleep a few times during our encounter. Diminished lung sounds noted on exam. CXR ordered due to his history of dysphagia. Changed to regular diet > 2 months ago against ST  advice. Cbc/diff and cmp also ordered.   Today, WBC 16.6. K+3.3. CXR still pending. Due to fatigue he is eating and drinking less. Nursing reports concentrated urine this morning. He continues to have a indwelling foley due to urinary retention. Last UTI 06/2021, culture positive for > 100,000 klebsiella pneumoniae. He was given a 10 day course of cipro and symptoms resolved. He continues to be fatigued, but answers questions appropriately.     Past Medical History:  Diagnosis Date   Anemia    Arthritis    AVM (arteriovenous malformation)    Barrett's esophagus    BPH (benign prostatic hyperplasia)    Cataract    Constipation    CPAP (continuous positive airway pressure) dependence    Depression    Diverticulosis    diverticular bleeding   Diverticulosis    GERD (gastroesophageal reflux disease)    History of radiation therapy    HOH (hard of hearing)    wears hearing aids   Hyperlipidemia    Hypoglycemia    Lymphocytosis 06/2012   Mastodynia    Murmur    slight   Nasal congestion    Osteoporosis    Osteoporosis    Prostate cancer (HCC)    Sleep apnea    wears CPAP   Urinary retention with incomplete bladder emptying    pt uses bathroom and within 30 mins needs to go again   Vitamin D deficiency    Past Surgical History:  Procedure Laterality Date   AMPUTATION TOE Right 09/20/2017   Procedure: Right 4th toe amputation;  Surgeon: Wylene Simmer, MD;  Location: [facility];  Service: Orthopedics;  Laterality: Right;  foot block   AMPUTATION TOE Right 02/05/2019   Procedure: RIGHT GREAT TOE AMPUTATION;  Surgeon: Leandrew Koyanagi, MD;  Location: [facility];  Service: Orthopedics;  Laterality: Right;   BACK SURGERY  04/2013   back surgury     f   BLEPHAROPLASTY Bilateral 02/23/15   CATARACT EXTRACTION     COLONOSCOPY     COLONOSCOPY N/A 10/02/2017   Procedure: COLONOSCOPY;  Surgeon: Doran Stabler, MD;  Location: [facility];  Service:  Gastroenterology;  Laterality: N/A;   COLONOSCOPY WITH PROPOFOL N/A 10/01/2017   Procedure: COLONOSCOPY WITH PROPOFOL;  Surgeon: Doran Stabler, MD;  Location: [facility];  Service: Gastroenterology;  Laterality: N/A;   COLONOSCOPY WITH PROPOFOL N/A 03/07/2018   Procedure: COLONOSCOPY WITH PROPOFOL;  Surgeon: [name], MD;  Location: [facility];  Service: Endoscopy;  Laterality: N/A;   ESOPHAGOGASTRODUODENOSCOPY (EGD) WITH PROPOFOL N/A 10/01/2017   Procedure: ESOPHAGOGASTRODUODENOSCOPY (EGD) WITH PROPOFOL;  Surgeon: Doran Stabler, MD;  Location: [facility];  Service: Gastroenterology;  Laterality: N/A;   EYE SURGERY Bilateral    FINGER SURGERY Right    right thumb   HARDWARE REMOVAL Right 05/15/2016   Procedure: Right Lumbar Two-Lumbar Five Removal of Hardware;  Surgeon: Kristeen Miss, MD;  Location: [facility];  Service: Neurosurgery;  Laterality: Right;  Right L2-5 removal of hardware   INGUINAL HERNIA REPAIR     x4   IR RADIOLOGIST EVAL & MGMT  07/10/2018   IR RADIOLOGIST EVAL & MGMT  09/05/2018   IR SACROPLASTY BILATERAL  07/15/2018   REPAIR SPIGELIAN HERNIA  2011   SPINAL FUSION  1996 ,2014   TONSILLECTOMY     UPPER GASTROINTESTINAL ENDOSCOPY     VASECTOMY      Allergies  Allergen Reactions   Celebrex [Celecoxib] Other (See Comments)    Created stomach ulcers.   Flexeril [Cyclobenzaprine] Other (See Comments)    Caught in hiatal hernia and caused extreme pain    Outpatient Encounter Medications as of 08/24/2021  Medication Sig   acetaminophen (TYLENOL) 500 MG tablet Take 500 mg by mouth 3 (three) times daily.    buPROPion (WELLBUTRIN SR) 200 MG 12 hr tablet Take 200 mg by mouth 2 (two) times daily.   carbamide peroxide (DEBROX) 6.5 % OTIC solution Place 5-10 drops into both ears every 30 (thirty) days. Flush ears every 3 months due to excessive cercumen build up Once A Day on the 15th of Every 3rd Month   carboxymethylcellul-glycerin (REFRESH OPTIVE) 0.5-0.9  % ophthalmic solution Place 1 drop into both eyes 3 (three) times daily.   chlorhexidine (PERIDEX) 0.12 % solution Use as directed 15 mLs in the mouth or throat at bedtime.   cholecalciferol (VITAMIN D) 1000 UNITS tablet Take 1,000 Units by mouth daily.   cyanocobalamin 1000 MCG tablet Take 1,000 mcg by mouth daily.   dextromethorphan-guaiFENesin (MUCINEX DM) 30-600 MG 12hr tablet Take 1 tablet by mouth 2 (two) times daily as needed.   diclofenac Sodium (VOLTAREN) 1 % GEL Apply 2 g topically 4 (four) times daily.   gabapentin (NEURONTIN) 100 MG capsule Take 2 capsules (200 mg total) by mouth 2 (two) times daily.   HYDROcodone-acetaminophen (NORCO/VICODIN) 5-325 MG tablet Take one tablet by mouth every 6 hours as needed for moderate pain. Do not exceed 4gm of Tylenol in 24 hours.   lactase (LACTAID) 3000 units  tablet Take 4,500 Units by mouth 3 (three) times daily with meals.    lovastatin (MEVACOR) 20 MG tablet Take 20 mg by mouth at bedtime.    melatonin 5 MG TABS Take 1 tablet (5 mg total) by mouth at bedtime.   mirtazapine (REMERON) 7.5 MG tablet Take 7.5 mg by mouth at bedtime.   Multiple Vitamins-Minerals (CENTRUM ADULTS PO) Take 1 tablet by mouth daily.   Multiple Vitamins-Minerals (PRESERVISION AREDS 2 PO) Take 1 capsule by mouth 2 (two) times daily.   pantoprazole (PROTONIX) 40 MG tablet Take 40 mg by mouth every morning.    polyethylene glycol (MIRALAX / GLYCOLAX) 17 g packet Take 17 g by mouth daily as needed.   propranolol (INDERAL) 10 MG tablet Take 10 mg by mouth daily.    rOPINIRole (REQUIP) 1 MG tablet Take 2 tablets (2 mg total) by mouth at bedtime.   senna (SENOKOT) 8.6 MG tablet Take 1 tablet by mouth daily.   zinc oxide 20 % ointment Apply 1 application topically as needed for irritation.   No facility-administered encounter medications on file as of 08/24/2021.    Review of Systems  Constitutional:  Positive for fatigue. Negative for activity change, appetite change,  chills and fever.  Respiratory:  Negative for cough, shortness of breath and wheezing.   Cardiovascular:  Negative for chest pain and leg swelling.  Gastrointestinal:  Negative for abdominal distention, abdominal pain, constipation, diarrhea and nausea.  Genitourinary:        Foley d/t retention  Musculoskeletal:  Positive for gait problem.  Neurological:  Positive for weakness. Negative for dizziness and headaches.  Psychiatric/Behavioral:  Positive for dysphoric mood. The patient is not nervous/anxious.    Immunization History  Administered Date(s) Administered   Influenza, High Dose Seasonal PF 09/17/2019   Influenza-Unspecified 09/16/2018, 09/07/2020   Moderna SARS-COV2 Booster Vaccination 10/18/2020, 05/11/2021   Moderna Sars-Covid-2 Vaccination 12/08/2019, 01/05/2020   Pneumococcal Conjugate-13 12/29/2013   Pneumococcal Polysaccharide-23 09/08/2005   Tdap 05/25/2005, 08/08/2018   Zoster Recombinat (Shingrix) 10/17/2017, 01/25/2018   Pertinent  Health Maintenance Due  Topic Date Due   OPHTHALMOLOGY EXAM  Never done   INFLUENZA VACCINE  07/04/2021   FOOT EXAM  08/17/2022   HEMOGLOBIN A1C  Discontinued   URINE MICROALBUMIN  Discontinued   Fall Risk  07/20/2021 01/01/2019 09/24/2018 01/04/2017 10/06/2016  Falls in the past year? 1 0 No No No  Number falls in past yr: 0 - - - -  Injury with Fall? 0 - - - -  Risk for fall due to : History of fall(s);Impaired balance/gait;Impaired mobility - - - -  Follow up Falls evaluation completed;Education provided;Falls prevention discussed - - - -   Functional Status Survey:    Vitals:   08/24/21 1159  BP: (!) 113/58  Pulse: 62  Resp: 18  Temp: (!) 97.2 F (36.2 C)  SpO2: 93%  Weight: 146 lb 1.6 oz (66.3 kg)  Height: 5\' 6"  (1.676 m)   Body mass index is 23.58 kg/m. Physical Exam Vitals reviewed.  Constitutional:      Appearance: He is ill-appearing.  HENT:     Head: Normocephalic.     Comments: Warm to touch    Right Ear:  There is no impacted cerumen.     Left Ear: There is no impacted cerumen.     Nose: Nose normal.     Mouth/Throat:     Mouth: Mucous membranes are moist.  Eyes:     General:  Right eye: No discharge.        Left eye: No discharge.  Neck:     Vascular: No carotid bruit.  Cardiovascular:     Rate and Rhythm: Normal rate and regular rhythm.     Pulses: Normal pulses.     Heart sounds: Normal heart sounds. No murmur heard. Pulmonary:     Effort: Pulmonary effort is normal. No respiratory distress.     Breath sounds: Normal breath sounds. No wheezing.  Abdominal:     General: Bowel sounds are normal. There is no distension.     Palpations: Abdomen is soft.     Tenderness: There is no abdominal tenderness.  Genitourinary:    Comments: Urine concentrated with sediment, slight odor Musculoskeletal:     Cervical back: Normal range of motion.     Right lower leg: No edema.     Left lower leg: No edema.  Lymphadenopathy:     Cervical: No cervical adenopathy.  Skin:    General: Skin is warm.     Coloration: Skin is pale.  Neurological:     General: No focal deficit present.     Mental Status: He is alert, oriented to person, place, and time and easily aroused.     Motor: Weakness present.     Gait: Gait abnormal.  Psychiatric:        Mood and Affect: Mood normal.        Behavior: Behavior normal.    Labs reviewed: Recent Labs    02/14/21 0000 05/16/21 0000 06/08/21 0000  NA 139 138 135*  K 4.2 4.0 4.2  CL 107 106 104  CO2 24* 24* 21  BUN 16 17 17   CREATININE 0.8 0.8 0.6  CALCIUM 9.0 8.8 8.8   Recent Labs    02/14/21 0000 05/16/21 0000 06/08/21 0000  AST 39 32 27  ALT 15 13 11   ALKPHOS 89 93 90  ALBUMIN 3.4* 3.4* 3.4*   Recent Labs    02/14/21 0000 05/16/21 0000 06/08/21 0000  WBC 7.9 7.5 8.3  NEUTROABS 6,012.00 5,460.00 6,574.00  HGB 12.9* 11.8* 12.3*  HCT 40* 36* 37*  PLT 264 258 259   Lab Results  Component Value Date   TSH 2.68 12/10/2020    Lab Results  Component Value Date   HGBA1C 5.2 08/29/2018   Lab Results  Component Value Date   CHOL 158 08/29/2018   HDL 34 (A) 05/06/2020   LDLCALC 103 05/06/2020   TRIG 154 05/06/2020    Significant Diagnostic Results in last 30 days:  No results found.  Assessment/Plan There are no diagnoses linked to this encounter.   Family/ staff Communication:  1. Abnormal urine odor - indwelling foley due to urinary retention - urine concentrated, sediment present, slight odor - WBC 16.6 09/20 - appears fatigued  - last UTI 06/2021- culture positive for > 100,000 klebsiella pneumoniae- given 10 day course of cipro - UA/culture-  ketones 1+, blood 2+, protein 2+, leukocytes 3+, bacteria many 08/24/2021 - rocephin 2g IM once  - rocephin 1g IM x 2 days- give 09/22 and 09/23  2. Malaise and fatigue - easily aroused, appropriate during encounter  3. Decreased lung sounds - diminished lung sounds on yesterday exam - hx dysphagia - CXR- pending  4. Diarrhea, unspecified type - resolved  5. Adult failure to thrive - progressive weakness in the past month  6. Hypokalemia - K+ 3.3 08/23/2021 - potassium chloride 20 meq po bid x 3 days  Labs/tests  ordered:  UA/culture

## 2021-08-25 ENCOUNTER — Encounter: Payer: Self-pay | Admitting: Internal Medicine

## 2021-08-25 ENCOUNTER — Telehealth: Payer: Self-pay | Admitting: Adult Health

## 2021-08-25 ENCOUNTER — Non-Acute Institutional Stay (SKILLED_NURSING_FACILITY): Payer: Medicare Other | Admitting: Internal Medicine

## 2021-08-25 DIAGNOSIS — D649 Anemia, unspecified: Secondary | ICD-10-CM | POA: Diagnosis not present

## 2021-08-25 DIAGNOSIS — K219 Gastro-esophageal reflux disease without esophagitis: Secondary | ICD-10-CM | POA: Diagnosis not present

## 2021-08-25 DIAGNOSIS — R1310 Dysphagia, unspecified: Secondary | ICD-10-CM

## 2021-08-25 DIAGNOSIS — R627 Adult failure to thrive: Secondary | ICD-10-CM

## 2021-08-25 DIAGNOSIS — F339 Major depressive disorder, recurrent, unspecified: Secondary | ICD-10-CM | POA: Diagnosis not present

## 2021-08-25 DIAGNOSIS — G2581 Restless legs syndrome: Secondary | ICD-10-CM

## 2021-08-25 DIAGNOSIS — E876 Hypokalemia: Secondary | ICD-10-CM

## 2021-08-25 DIAGNOSIS — J189 Pneumonia, unspecified organism: Secondary | ICD-10-CM

## 2021-08-25 DIAGNOSIS — R634 Abnormal weight loss: Secondary | ICD-10-CM | POA: Diagnosis not present

## 2021-08-25 LAB — CBC AND DIFFERENTIAL
HCT: 36 — AB (ref 41–53)
Hemoglobin: 11.8 — AB (ref 13.5–17.5)
Neutrophils Absolute: 14279
WBC: 16.3

## 2021-08-25 LAB — BASIC METABOLIC PANEL
BUN: 21 (ref 4–21)
CO2: 21 (ref 13–22)
Chloride: 108 (ref 99–108)
Creatinine: 0.8 (ref 0.6–1.3)
Glucose: 73
Potassium: 3.8 (ref 3.4–5.3)
Sodium: 141 (ref 137–147)

## 2021-08-25 LAB — COMPREHENSIVE METABOLIC PANEL
Albumin: 2.9 — AB (ref 3.5–5.0)
Calcium: 8.1 — AB (ref 8.7–10.7)
Globulin: 3.3

## 2021-08-25 LAB — HEPATIC FUNCTION PANEL
ALT: 15 (ref 10–40)
AST: 37 (ref 14–40)
Alkaline Phosphatase: 112 (ref 25–125)
Bilirubin, Total: 0.3

## 2021-08-25 LAB — CBC: RBC: 4.05 (ref 3.87–5.11)

## 2021-08-25 MED ORDER — LEVOFLOXACIN 750 MG PO TABS: 750.0000 mg | ORAL_TABLET | Freq: Every day | ORAL | 0 refills | Status: DC

## 2021-08-25 NOTE — Progress Notes (Addendum)
Location:   [facility] Room Number: Jun 08, 1931 Place of Service:  SNF [phone]) Provider:  Veleta Miners MD  [name], MD  Patient Care Team: [name], MD as PCP - General (Internal Medicine) [name], MD (Hematology and Oncology) [name], MD as Attending Physician (Urology) [name], MD (Gastroenterology)  Extended Emergency Contact Information Primary Emergency Contact: [name], Charles Mobile Phone: [phone] Relation: Son Preferred language: English Interpreter needed? No Secondary Emergency Contact: [name],Charles H Address: [address]          [address]          [place], [place] [address] Charles [name] of [place] Phone: [phone] Mobile Phone: [phone] Relation: Spouse  Code Status:  DNR Goals of care: Advanced Directive information Advanced Directives 08/25/2021  Does Patient Have a Medical Advance Directive? Yes  Type of Paramedic of [place];Living will;Out of facility DNR (pink MOST or yellow form)  Does patient want to make changes to medical advance directive? No - Patient declined  Copy of [place] in Chart? Yes - validated most recent copy scanned in chart (See row information)  Would patient like information on creating a medical advance directive? -  Pre-existing out of facility DNR order (yellow form or pink MOST form) Yellow form placed in chart (order not valid for inpatient use);Pink MOST form placed in chart (order not valid for inpatient use)     Chief Complaint  Patient presents with   Acute Visit    Pneumonia    HPI:  Pt is a 87 y.o. male seen today for an acute visit for Pneumonia Possible aspiration  Patient has number of medical problems including Chronic Diarrhea, H/O AVM, Barretts Esphagus, OSA on CPAP, Hypertension, h/o Prostate cancer wih Chronic Foley Catheter, Chronic Pain due to Kyphosis and Neuropathy and C Diff  colitis S/P Right Great Toe Amputation in 3/20 for Osteomyelitis   Was seen yesterday for Feeling tired and Sleepy Did have some Cough but no SOB or fever CBC showed WBC of 16 K Started Empirically on Rocephin. UA showed WBC and Nitrates Culture Pending Does have Chronic Foley Chest xray positive for Left lower lobe pneumonia On Levaquin now Still feel tired and Weak  Per my previous notes Patient has lost almost 20 pounds since my last visit 3 months ago He was put on special dysphagia diet because of risk of aspiration but patient has refused and is now on regular diet with bite-size meats   Past Medical History:  Diagnosis Date   Anemia    Arthritis    AVM (arteriovenous malformation)    Barrett's esophagus    BPH (benign prostatic hyperplasia)    Cataract    Constipation    CPAP (continuous positive airway pressure) dependence    Depression    Diverticulosis    diverticular bleeding   Diverticulosis    GERD (gastroesophageal reflux disease)    History of radiation therapy    HOH (hard of hearing)    wears hearing aids   Hyperlipidemia    Hypoglycemia    Lymphocytosis 06/2012   Mastodynia    Murmur    slight   Nasal congestion    Osteoporosis    Osteoporosis    Prostate cancer ([place])    Sleep apnea    wears CPAP   Urinary retention with incomplete bladder emptying    pt uses bathroom and within 30 mins needs to go again  Vitamin D deficiency    Past Surgical History:  Procedure Laterality Date   AMPUTATION TOE Right 09/20/2017   Procedure: Right 4th toe amputation;  Surgeon: Wylene Simmer, MD;  Location: [facility];  Service: Orthopedics;  Laterality: Right;  foot block   AMPUTATION TOE Right 02/05/2019   Procedure: RIGHT GREAT TOE AMPUTATION;  Surgeon: Leandrew Koyanagi, MD;  Location: [facility];  Service: Orthopedics;  Laterality: Right;   BACK SURGERY  04/2013   back surgury     f   BLEPHAROPLASTY Bilateral 02/23/15   CATARACT  EXTRACTION     COLONOSCOPY     COLONOSCOPY N/A 10/02/2017   Procedure: COLONOSCOPY;  Surgeon: Doran Stabler, MD;  Location: [facility];  Service: Gastroenterology;  Laterality: N/A;   COLONOSCOPY WITH PROPOFOL N/A 10/01/2017   Procedure: COLONOSCOPY WITH PROPOFOL;  Surgeon: Doran Stabler, MD;  Location: [facility];  Service: Gastroenterology;  Laterality: N/A;   COLONOSCOPY WITH PROPOFOL N/A 03/07/2018   Procedure: COLONOSCOPY WITH PROPOFOL;  Surgeon: [name], MD;  Location: [facility];  Service: Endoscopy;  Laterality: N/A;   ESOPHAGOGASTRODUODENOSCOPY (EGD) WITH PROPOFOL N/A 10/01/2017   Procedure: ESOPHAGOGASTRODUODENOSCOPY (EGD) WITH PROPOFOL;  Surgeon: Doran Stabler, MD;  Location: [facility];  Service: Gastroenterology;  Laterality: N/A;   EYE SURGERY Bilateral    FINGER SURGERY Right    right thumb   HARDWARE REMOVAL Right 05/15/2016   Procedure: Right Lumbar Two-Lumbar Five Removal of Hardware;  Surgeon: Kristeen Miss, MD;  Location: [facility];  Service: Neurosurgery;  Laterality: Right;  Right L2-5 removal of hardware   INGUINAL HERNIA REPAIR     x4   IR RADIOLOGIST EVAL & MGMT  07/10/2018   IR RADIOLOGIST EVAL & MGMT  09/05/2018   IR SACROPLASTY BILATERAL  07/15/2018   REPAIR SPIGELIAN HERNIA  2011   SPINAL FUSION  1996 ,2014   TONSILLECTOMY     UPPER GASTROINTESTINAL ENDOSCOPY     VASECTOMY      Allergies  Allergen Reactions   Celebrex [Celecoxib] Other (See Comments)    Created stomach ulcers.   Flexeril [Cyclobenzaprine] Other (See Comments)    Caught in hiatal hernia and caused extreme pain    Allergies as of 08/25/2021       Reactions   Celebrex [celecoxib] Other (See Comments)   Created stomach ulcers.   Flexeril [cyclobenzaprine] Other (See Comments)   Caught in hiatal hernia and caused extreme pain        Medication List        Accurate as of August 25, 2021 11:11 AM. If you have any questions, ask your nurse or doctor.           acetaminophen 500 MG tablet Commonly known as: TYLENOL Take 500 mg by mouth 3 (three) times daily.   buPROPion 200 MG 12 hr tablet Commonly known as: WELLBUTRIN SR Take 200 mg by mouth 2 (two) times daily.   carbamide peroxide 6.5 % OTIC solution Commonly known as: DEBROX Place 5-10 drops into both ears every 30 (thirty) days. Flush ears every 3 months due to excessive cercumen build up Once A Day on the 15th of Every 3rd Month   carboxymethylcellul-glycerin 0.5-0.9 % ophthalmic solution Commonly known as: REFRESH OPTIVE Place 1 drop into both eyes 3 (three) times daily.   cefTRIAXone 1 g injection Commonly known as: ROCEPHIN Inject 1 g into the muscle once.   CENTRUM ADULTS PO Take 1 tablet  by mouth daily.   PRESERVISION AREDS 2 PO Take 1 capsule by mouth 2 (two) times daily.   chlorhexidine 0.12 % solution Commonly known as: PERIDEX Use as directed 15 mLs in the mouth or throat at bedtime.   cholecalciferol 1000 units tablet Commonly known as: VITAMIN D Take 1,000 Units by mouth daily.   cyanocobalamin 1000 MCG tablet Take 1,000 mcg by mouth daily.   dextromethorphan-guaiFENesin 30-600 MG 12hr tablet Commonly known as: MUCINEX DM Take 1 tablet by mouth 2 (two) times daily as needed.   diclofenac Sodium 1 % Gel Commonly known as: VOLTAREN Apply 2 g topically 4 (four) times daily.   gabapentin 100 MG capsule Commonly known as: NEURONTIN Take 2 capsules (200 mg total) by mouth 2 (two) times daily.   HYDROcodone-acetaminophen 5-325 MG tablet Commonly known as: NORCO/VICODIN Take one tablet by mouth every 6 hours as needed for moderate pain. Do not exceed 4gm of Tylenol in 24 hours.   lactase 3000 units tablet Commonly known as: LACTAID Take 4,500 Units by mouth 3 (three) times daily with meals.   levofloxacin 750 MG tablet Commonly known as: Levaquin Take 1 tablet (750 mg total) by mouth daily. Started by: Royal Hawthorn, NP   lovastatin 20  MG tablet Commonly known as: MEVACOR Take 20 mg by mouth at bedtime.   melatonin 5 MG Tabs Take 1 tablet (5 mg total) by mouth at bedtime.   mirtazapine 7.5 MG tablet Commonly known as: REMERON Take 7.5 mg by mouth at bedtime.   pantoprazole 40 MG tablet Commonly known as: PROTONIX Take 40 mg by mouth every morning.   polyethylene glycol 17 g packet Commonly known as: MIRALAX / GLYCOLAX Take 17 g by mouth daily as needed.   potassium chloride SA 20 MEQ tablet Commonly known as: KLOR-CON Take 20 mEq by mouth 2 (two) times daily.   propranolol 10 MG tablet Commonly known as: INDERAL Take 10 mg by mouth daily.   rOPINIRole 1 MG tablet Commonly known as: Requip Take 2 tablets (2 mg total) by mouth at bedtime.   senna 8.6 MG tablet Commonly known as: SENOKOT Take 1 tablet by mouth daily.   zinc oxide 20 % ointment Apply 1 application topically as needed for irritation.        Review of Systems  Constitutional:  Positive for activity change, appetite change and unexpected weight change.  HENT: Negative.    Respiratory:  Positive for cough. Negative for shortness of breath.   Cardiovascular: Negative.   Gastrointestinal: Negative.   Genitourinary: Negative.   Musculoskeletal:  Positive for gait problem.  Skin: Negative.   Neurological:  Positive for weakness.  Psychiatric/Behavioral:  Positive for dysphoric mood.    Immunization History  Administered Date(s) Administered   Influenza, High Dose Seasonal PF 09/17/2019   Influenza-Unspecified 09/16/2018, 09/07/2020   Moderna SARS-COV2 Booster Vaccination 10/18/2020, 05/11/2021   Moderna Sars-Covid-2 Vaccination 12/08/2019, 01/05/2020   Pneumococcal Conjugate-13 12/29/2013   Pneumococcal Polysaccharide-23 09/08/2005   Tdap 05/25/2005, 08/08/2018   Zoster Recombinat (Shingrix) 10/17/2017, 01/25/2018   Pertinent  Health Maintenance Due  Topic Date Due   OPHTHALMOLOGY EXAM  Never done   INFLUENZA VACCINE   07/04/2021   FOOT EXAM  08/17/2022   HEMOGLOBIN A1C  Discontinued   URINE MICROALBUMIN  Discontinued   Fall Risk  07/20/2021 01/01/2019 09/24/2018 01/04/2017 10/06/2016  Falls in the past year? 1 0 No No No  Number falls in past yr: 0 - - - -  Injury with  Fall? 0 - - - -  Risk for fall due to : History of fall(s);Impaired balance/gait;Impaired mobility - - - -  Follow up Falls evaluation completed;Education provided;Falls prevention discussed - - - -   Functional Status Survey:    Vitals:   08/25/21 1046  BP: 118/70  Pulse: 61  Resp: 18  Temp: 97.8 F (36.6 C)  SpO2: 93%  Weight: 146 lb 1.6 oz (66.3 kg)  Height: 5\' 6"  (1.676 m)   Body mass index is 23.58 kg/m. Physical Exam Vitals reviewed.  Constitutional:      Appearance: Normal appearance.     Comments: Very Frail and Looks Tired  HENT:     Head: Normocephalic.     Nose: Nose normal.     Mouth/Throat:     Mouth: Mucous membranes are moist.     Pharynx: Oropharynx is clear.  Eyes:     Pupils: Pupils are equal, round, and reactive to light.  Cardiovascular:     Rate and Rhythm: Normal rate and regular rhythm.     Pulses: Normal pulses.     Heart sounds: Normal heart sounds.  Pulmonary:     Effort: Pulmonary effort is normal.     Breath sounds: Normal breath sounds. No rales.  Abdominal:     General: Abdomen is flat. Bowel sounds are normal.     Palpations: Abdomen is soft.  Musculoskeletal:        General: No swelling.     Cervical back: Neck supple.  Skin:    General: Skin is warm.  Neurological:     General: No focal deficit present.     Mental Status: He is alert and oriented to person, place, and time.  Psychiatric:        Mood and Affect: Mood normal.        Thought Content: Thought content normal.    Labs reviewed: Recent Labs    05/16/21 0000 06/08/21 0000 08/23/21 0000  NA 138 135* 137  K 4.0 4.2 3.3*  CL 106 104 106  CO2 24* 21 21  BUN 17 17 24*  CREATININE 0.8 0.6 0.9  CALCIUM 8.8 8.8  8.3*   Recent Labs    05/16/21 0000 06/08/21 0000 08/23/21 0000  AST 32 27 29  ALT 13 11 11   ALKPHOS 93 90 99  ALBUMIN 3.4* 3.4* 3.1*   Recent Labs    05/16/21 0000 06/08/21 0000 08/23/21 0000  WBC 7.5 8.3 16.6  NEUTROABS 5,460.00 6,574.00 14,907.00  HGB 11.8* 12.3* 11.0*  HCT 36* 37* 34*  PLT 258 259 238   Lab Results  Component Value Date   TSH 2.68 12/10/2020   Lab Results  Component Value Date   HGBA1C 5.2 08/29/2018   Lab Results  Component Value Date   CHOL 158 08/29/2018   HDL 34 (A) 05/06/2020   LDLCALC 103 05/06/2020   TRIG 154 05/06/2020    Significant Diagnostic Results in last 30 days:  No results found.  Assessment/Plan Pneumonia of left lower lobe due to infectious organism ? Aspiration as he did start Regular food few weeks ago. 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 Though his weight has stabilized but I talked to his son and Wife about his declining health His wife who lives here with him in SNF says she  is not ready for hospice yet They  will talk ot other Family member and decide if that would be something to consider Dysphagia, unspecified type D/w Speech to be evaluated again for his Diet  Chronic Foley Catheter Urine Culture Pending Urine was positive for Nitrates and Leucocytes Levaquin and Rocephin should cover for it Hypokalemia On Potassium Supplement Depression with Weight loss On Wellbutrin and Remeron GERD On Protonix Peripheral Neuropathy On Gabapentin HLD Continue on statin Restless Leg Continue Requip   Family/ staff Communication: Wife and SOn  Labs/tests ordered:

## 2021-08-25 NOTE — Telephone Encounter (Signed)
Nurse called to report that Charles [name] is weak and fatigued and not feeling well. He had a CXR which showed LLL pna. He has received one dose of Rocephin. Levaquin ordered 750 mg qd x 5 days starting 9/22.

## 2021-08-25 NOTE — Progress Notes (Signed)
This encounter was created in error - please disregard.

## 2021-08-30 ENCOUNTER — Encounter: Payer: Self-pay | Admitting: Orthopedic Surgery

## 2021-08-30 ENCOUNTER — Non-Acute Institutional Stay (SKILLED_NURSING_FACILITY): Payer: Medicare Other | Admitting: Orthopedic Surgery

## 2021-08-30 DIAGNOSIS — F339 Major depressive disorder, recurrent, unspecified: Secondary | ICD-10-CM

## 2021-08-30 DIAGNOSIS — E876 Hypokalemia: Secondary | ICD-10-CM | POA: Diagnosis not present

## 2021-08-30 DIAGNOSIS — M5459 Other low back pain: Secondary | ICD-10-CM | POA: Diagnosis not present

## 2021-08-30 DIAGNOSIS — R1310 Dysphagia, unspecified: Secondary | ICD-10-CM

## 2021-08-30 DIAGNOSIS — R627 Adult failure to thrive: Secondary | ICD-10-CM | POA: Diagnosis not present

## 2021-08-30 DIAGNOSIS — R1312 Dysphagia, oropharyngeal phase: Secondary | ICD-10-CM | POA: Diagnosis not present

## 2021-08-30 DIAGNOSIS — J189 Pneumonia, unspecified organism: Secondary | ICD-10-CM | POA: Diagnosis not present

## 2021-08-30 DIAGNOSIS — K219 Gastro-esophageal reflux disease without esophagitis: Secondary | ICD-10-CM

## 2021-08-30 DIAGNOSIS — M6281 Muscle weakness (generalized): Secondary | ICD-10-CM | POA: Diagnosis not present

## 2021-08-30 DIAGNOSIS — G2581 Restless legs syndrome: Secondary | ICD-10-CM | POA: Diagnosis not present

## 2021-08-30 DIAGNOSIS — L89322 Pressure ulcer of left buttock, stage 2: Secondary | ICD-10-CM | POA: Diagnosis not present

## 2021-08-30 DIAGNOSIS — Z960 Presence of urogenital implants: Secondary | ICD-10-CM | POA: Diagnosis not present

## 2021-08-30 DIAGNOSIS — R8290 Unspecified abnormal findings in urine: Secondary | ICD-10-CM

## 2021-08-30 LAB — CBC: RBC: 4.05 (ref 3.87–5.11)

## 2021-08-30 LAB — COMPREHENSIVE METABOLIC PANEL

## 2021-08-30 LAB — BASIC METABOLIC PANEL

## 2021-08-30 LAB — HEPATIC FUNCTION PANEL

## 2021-08-30 NOTE — Progress Notes (Signed)
Location:   [facility] Room Number: [place] of Service:  SNF [phone]) Provider:  Windell Moulding, NP    Patient Care Team: [name], MD as PCP - General (Internal Medicine) [name], MD (Hematology and Oncology) [name], MD as Attending Physician (Urology) [name], MD (Gastroenterology)  Extended Emergency Contact Information Primary Emergency Contact: Jaclyn, Carew Mobile Phone: [phone] Relation: Son Preferred language: English Interpreter needed? No Secondary Emergency Contact: Lawhorn,Janet H Address: [address]          [address]          [place], [place] [address] [name] of [place] Phone: [phone] Mobile Phone: [phone] Relation: Spouse  Code Status:  DNR Goals of care: Advanced Directive information Advanced Directives 08/30/2021  Does Patient Have a Medical Advance Directive? Yes  Type of Paramedic of [place];Living will;Out of facility DNR (pink MOST or yellow form)  Does patient want to make changes to medical advance directive? No - Patient declined  Copy of [place] in Chart? Yes - validated most recent copy scanned in chart (See row information)  Would patient like information on creating a medical advance directive? -  Pre-existing out of facility DNR order (yellow form or pink MOST form) -     Chief Complaint  Patient presents with   Acute Visit    Pressure Ulcer.    HPI:  Pt is a 87 y.o. male seen today for an acute visit for pressure ulcer.   He currently resides in the skilled nursing unit at [facility]. Past medical history includes: OSA, neuropathy, REM sleep behavior disorder, essential tremor, GERD, depression, constipation, hyperlipidemia, left foot contracture, and adult failure to thrive.  Nursing reports pressure ulcer to left buttocks. Seen by wound nurse, foam dressing applied. He has had progressive weakness  within the past week due to pneumonia and failure to thrive. In the past month, he has been transferring to wheelchair less, requiring hoyer lift more. He has not been out of bed in the past few days. Seen by Dr. Lyndel Safe  09/22, hospice consult made.   PNA- CXR positive for left lower lobe pneumonia. WBC 16.3 08/23/2021. Levaquin started. Denies cough and sob.  Chronic foley- due to urinary retention, UA/culture taken last week due to odor. UA positive for leukocytes, nitrates and blood. Culture pending. UTI 06/2021, culture positive for > 100,000 klebsiella pneumoniae. He was given a 10 day course of cipro and symptoms resolved.  Adult failure to thrive- he has lost 20 lbs within the past 3 months, he was on a special dysphagia diet for awhile but refused and asked to be placed on regular diet to promote eating, Remeron Hypokalemia- K+ 3.3 08/23/2021, given potassium 40 meq bid x 3 days, K+ 3.8 08/25/2021.  RLS- remains on ropinirole and gabapentin GERD- hgb 11.8 08/25/2021, denies increased reflux, Protonix daily Depression- flat affect today, but appeared more alert and smiled a few times, wife pleased he is less fatigued today, Wellbutrin and Remeron     Past Medical History:  Diagnosis Date   Anemia    Arthritis    AVM (arteriovenous malformation)    Barrett's esophagus    BPH (benign prostatic hyperplasia)    Cataract    Constipation    CPAP (continuous positive airway pressure) dependence    Depression    Diverticulosis    diverticular bleeding   Diverticulosis    GERD (gastroesophageal reflux  disease)    History of radiation therapy    HOH (hard of hearing)    wears hearing aids   Hyperlipidemia    Hypoglycemia    Lymphocytosis 06/2012   Mastodynia    Murmur    slight   Nasal congestion    Osteoporosis    Osteoporosis    Prostate cancer ([place])    Sleep apnea    wears CPAP   Urinary retention with incomplete bladder emptying    pt uses bathroom and within 30 mins needs  to go again   Vitamin D deficiency    Past Surgical History:  Procedure Laterality Date   AMPUTATION TOE Right 09/20/2017   Procedure: Right 4th toe amputation;  Surgeon: Wylene Simmer, MD;  Location: [facility];  Service: Orthopedics;  Laterality: Right;  foot block   AMPUTATION TOE Right 02/05/2019   Procedure: RIGHT GREAT TOE AMPUTATION;  Surgeon: Leandrew Koyanagi, MD;  Location: [facility];  Service: Orthopedics;  Laterality: Right;   BACK SURGERY  04/2013   back surgury     f   BLEPHAROPLASTY Bilateral 02/23/15   CATARACT EXTRACTION     COLONOSCOPY     COLONOSCOPY N/A 10/02/2017   Procedure: COLONOSCOPY;  Surgeon: Doran Stabler, MD;  Location: [facility];  Service: Gastroenterology;  Laterality: N/A;   COLONOSCOPY WITH PROPOFOL N/A 10/01/2017   Procedure: COLONOSCOPY WITH PROPOFOL;  Surgeon: Doran Stabler, MD;  Location: [facility];  Service: Gastroenterology;  Laterality: N/A;   COLONOSCOPY WITH PROPOFOL N/A 03/07/2018   Procedure: COLONOSCOPY WITH PROPOFOL;  Surgeon: [name], MD;  Location: [facility];  Service: Endoscopy;  Laterality: N/A;   ESOPHAGOGASTRODUODENOSCOPY (EGD) WITH PROPOFOL N/A 10/01/2017   Procedure: ESOPHAGOGASTRODUODENOSCOPY (EGD) WITH PROPOFOL;  Surgeon: Doran Stabler, MD;  Location: [facility];  Service: Gastroenterology;  Laterality: N/A;   EYE SURGERY Bilateral    FINGER SURGERY Right    right thumb   HARDWARE REMOVAL Right 05/15/2016   Procedure: Right Lumbar Two-Lumbar Five Removal of Hardware;  Surgeon: Kristeen Miss, MD;  Location: [facility];  Service: Neurosurgery;  Laterality: Right;  Right L2-5 removal of hardware   INGUINAL HERNIA REPAIR     x4   IR RADIOLOGIST EVAL & MGMT  07/10/2018   IR RADIOLOGIST EVAL & MGMT  09/05/2018   IR SACROPLASTY BILATERAL  07/15/2018   REPAIR SPIGELIAN HERNIA  2011   SPINAL FUSION  1996 ,2014   TONSILLECTOMY     UPPER GASTROINTESTINAL ENDOSCOPY     VASECTOMY       Allergies  Allergen Reactions   Celebrex [Celecoxib] Other (See Comments)    Created stomach ulcers.   Flexeril [Cyclobenzaprine] Other (See Comments)    Caught in hiatal hernia and caused extreme pain    Allergies as of 08/30/2021       Reactions   Celebrex [celecoxib] Other (See Comments)   Created stomach ulcers.   Flexeril [cyclobenzaprine] Other (See Comments)   Caught in hiatal hernia and caused extreme pain        Medication List        Accurate as of August 30, 2021 11:08 AM. If you have any questions, ask your nurse or doctor.          STOP taking these medications    carbamide peroxide 6.5 % OTIC solution Commonly known as: DEBROX Stopped by: Yvonna Alanis, NP       TAKE these medications  acetaminophen 500 MG tablet Commonly known as: TYLENOL Take 500 mg by mouth 3 (three) times daily.   buPROPion 200 MG 12 hr tablet Commonly known as: WELLBUTRIN SR Take 200 mg by mouth 2 (two) times daily.   carboxymethylcellul-glycerin 0.5-0.9 % ophthalmic solution Commonly known as: REFRESH OPTIVE Place 1 drop into both eyes 3 (three) times daily.   CENTRUM ADULTS PO Take 1 tablet by mouth daily.   PRESERVISION AREDS 2 PO Take 1 capsule by mouth 2 (two) times daily.   chlorhexidine 0.12 % solution Commonly known as: PERIDEX Use as directed 15 mLs in the mouth or throat at bedtime.   cholecalciferol 1000 units tablet Commonly known as: VITAMIN D Take 1,000 Units by mouth daily.   cyanocobalamin 1000 MCG tablet Take 1,000 mcg by mouth daily.   dextromethorphan-guaiFENesin 30-600 MG 12hr tablet Commonly known as: MUCINEX DM Take 1 tablet by mouth 2 (two) times daily as needed.   diclofenac Sodium 1 % Gel Commonly known as: VOLTAREN Apply 2 g topically 4 (four) times daily.   gabapentin 100 MG capsule Commonly known as: NEURONTIN Take 2 capsules (200 mg total) by mouth 2 (two) times daily.   HYDROcodone-acetaminophen 5-325 MG  tablet Commonly known as: NORCO/VICODIN Take one tablet by mouth every 6 hours as needed for moderate pain. Do not exceed 4gm of Tylenol in 24 hours.   lactase 3000 units tablet Commonly known as: LACTAID Take 4,500 Units by mouth 3 (three) times daily with meals.   levofloxacin 750 MG tablet Commonly known as: Levaquin Take 1 tablet (750 mg total) by mouth daily.   lovastatin 20 MG tablet Commonly known as: MEVACOR Take 20 mg by mouth at bedtime.   melatonin 5 MG Tabs Take 1 tablet (5 mg total) by mouth at bedtime.   mirtazapine 7.5 MG tablet Commonly known as: REMERON Take 7.5 mg by mouth at bedtime.   pantoprazole 40 MG tablet Commonly known as: PROTONIX Take 40 mg by mouth every morning.   polyethylene glycol 17 g packet Commonly known as: MIRALAX / GLYCOLAX Take 17 g by mouth daily as needed.   propranolol 10 MG tablet Commonly known as: INDERAL Take 10 mg by mouth daily.   rOPINIRole 1 MG tablet Commonly known as: Requip Take 2 tablets (2 mg total) by mouth at bedtime.   senna 8.6 MG tablet Commonly known as: SENOKOT Take 1 tablet by mouth daily.   zinc oxide 20 % ointment Apply 1 application topically as needed for irritation.        Review of Systems  Constitutional:  Positive for activity change and fatigue. Negative for appetite change, chills and fever.  HENT:  Positive for hearing loss and trouble swallowing.   Eyes:  Negative for visual disturbance.  Respiratory:  Negative for cough, shortness of breath and wheezing.   Cardiovascular:  Negative for chest pain and leg swelling.  Gastrointestinal:  Negative for abdominal distention, abdominal pain, constipation, diarrhea and nausea.  Genitourinary:  Negative for hematuria.       Foley  Musculoskeletal:  Positive for arthralgias, gait problem and myalgias.  Skin:        Pressure ulcer  Neurological:  Positive for weakness and numbness. Negative for dizziness and headaches.   Psychiatric/Behavioral:  Positive for dysphoric mood. Negative for sleep disturbance. The patient is not nervous/anxious.    Immunization History  Administered Date(s) Administered   Influenza, High Dose Seasonal PF 09/17/2019   Influenza-Unspecified 09/16/2018, 09/07/2020   Moderna SARS-COV2 Booster  Vaccination 10/18/2020, 05/11/2021   Moderna Sars-Covid-2 Vaccination 12/08/2019, 01/05/2020   Pneumococcal Conjugate-13 12/29/2013   Pneumococcal Polysaccharide-23 09/08/2005   Tdap 05/25/2005, 08/08/2018   Zoster Recombinat (Shingrix) 10/17/2017, 01/25/2018   Pertinent  Health Maintenance Due  Topic Date Due   OPHTHALMOLOGY EXAM  Never done   INFLUENZA VACCINE  07/04/2021   FOOT EXAM  08/17/2022   HEMOGLOBIN A1C  Discontinued   URINE MICROALBUMIN  Discontinued   Fall Risk  07/20/2021 01/01/2019 09/24/2018 01/04/2017 10/06/2016  Falls in the past year? 1 0 No No No  Number falls in past yr: 0 - - - -  Injury with Fall? 0 - - - -  Risk for fall due to : History of fall(s);Impaired balance/gait;Impaired mobility - - - -  Follow up Falls evaluation completed;Education provided;Falls prevention discussed - - - -   Functional Status Survey:    Vitals:   08/30/21 1052  BP: 118/70  Pulse: 61  Resp: 18  Temp: 98.3 F (36.8 C)  SpO2: 95%  Weight: 146 lb 1.6 oz (66.3 kg)  Height: 5\' 6"  (1.676 m)   Body mass index is 23.58 kg/m. Physical Exam Vitals reviewed.  Constitutional:      General: He is not in acute distress. HENT:     Head: Normocephalic.     Right Ear: There is no impacted cerumen.     Left Ear: There is no impacted cerumen.     Ears:     Comments: Bilateral hearing aids    Nose: Nose normal.     Mouth/Throat:     Mouth: Mucous membranes are moist.  Eyes:     General:        Right eye: No discharge.        Left eye: No discharge.  Neck:     Vascular: No carotid bruit.  Cardiovascular:     Rate and Rhythm: Normal rate and regular rhythm.     Pulses: Normal  pulses.     Heart sounds: Normal heart sounds. No murmur heard. Pulmonary:     Effort: Pulmonary effort is normal. No respiratory distress.     Breath sounds: Examination of the left-lower field reveals decreased breath sounds. Decreased breath sounds present. No wheezing.  Abdominal:     General: Bowel sounds are normal. There is no distension.     Palpations: Abdomen is soft.     Tenderness: There is no abdominal tenderness.  Genitourinary:    Comments: Amber urine with sediment, slight odor in foley collection bag Musculoskeletal:     Cervical back: Normal range of motion.     Right lower leg: No edema.     Left lower leg: No edema.  Lymphadenopathy:     Cervical: No cervical adenopathy.  Skin:    General: Skin is warm and dry.     Capillary Refill: Capillary refill takes less than 2 seconds.     Findings: Lesion present.     Comments: Stage II pressure ulcer to left buttocks measuring 3 cm x 2 cm, serous drainage, no sign of infection, surrounding skin red and blanchable. Right buttocks with quarter sized area of redness, skin blanchable. Quarter sized area of redness to left scapula, blanchable.   Neurological:     General: No focal deficit present.     Mental Status: He is alert and oriented to person, place, and time.     Motor: Weakness present.     Gait: Gait abnormal.     Comments: Bed bound  Psychiatric:        Mood and Affect: Mood normal. Affect is flat.        Behavior: Behavior normal.    Labs reviewed: Recent Labs    06/08/21 0000 08/23/21 0000 08/25/21 0000  NA 135* 137 141  K 4.2 3.3* 3.8  CL 104 106 108  CO2 21 21 21   BUN 17 24* 21  CREATININE 0.6 0.9 0.8  CALCIUM 8.8 8.3* 8.1*   Recent Labs    06/08/21 0000 08/23/21 0000 08/25/21 0000  AST 27 29 37  ALT 11 11 15   ALKPHOS 90 99 112  ALBUMIN 3.4* 3.1* 2.9*   Recent Labs    05/16/21 0000 06/08/21 0000 08/23/21 0000 08/25/21 0000  WBC 7.5 8.3 16.6 16.3  NEUTROABS 5,460.00 6,574.00  14,907.00 14,279.00  HGB 11.8* 12.3* 11.0* 11.8*  HCT 36* 37* 34* 36*  PLT 258 259 238  --    Lab Results  Component Value Date   TSH 2.68 12/10/2020   Lab Results  Component Value Date   HGBA1C 5.2 08/29/2018   Lab Results  Component Value Date   CHOL 158 08/29/2018   HDL 34 (A) 05/06/2020   LDLCALC 103 05/06/2020   TRIG 154 05/06/2020    Significant Diagnostic Results in last 30 days:  No results found.  Assessment/Plan 1. Pressure injury of left buttock, stage 2 (HCC) - measuring 3 cm x 2 cm, CDI - cont daily dressing changes, cleanse with saline and apply new foam dressing - place air mattress on bed - Prostat daily - turn patient every 4 hours - concerns for new areas- right buttocks and left scapula - apply zinc oxide to upper back and buttocks bid x 14 days then prn  2. Pneumonia of left lower lobe due to infectious organism - LLL still diminished - cont Levaquin  3. Dysphagia, unspecified type - refused special dysphagia diet a few months ago to promote weight gain - no recent aspirations  4. Adult failure to thrive - progressive weight loss and weakness last 3 months - hospice consulted 09/22  5. Abnormal urine odor - indwelling foley d/t urinary retention - UA positive for leukocytes and nitrates - culture- pending  6. RLS (restless legs syndrome) - cont ropinirole and gabapentin  7. Gastroesophageal reflux disease without esophagitis - no increased reflux - cont Protonix  8. Depression, recurrent ([place]) - flat affect today - cont Wellbutrin and Remeron  9. Hypokalemia - K+ 3.8 08/25/2021    Family/ staff Communication: plan discussed with patient, wife and nurse  Labs/tests ordered:  urine culture pending

## 2021-09-01 DIAGNOSIS — M6281 Muscle weakness (generalized): Secondary | ICD-10-CM | POA: Diagnosis not present

## 2021-09-01 DIAGNOSIS — Z960 Presence of urogenital implants: Secondary | ICD-10-CM | POA: Diagnosis not present

## 2021-09-01 DIAGNOSIS — M5459 Other low back pain: Secondary | ICD-10-CM | POA: Diagnosis not present

## 2021-09-01 DIAGNOSIS — R1312 Dysphagia, oropharyngeal phase: Secondary | ICD-10-CM | POA: Diagnosis not present

## 2021-09-06 ENCOUNTER — Encounter: Payer: Self-pay | Admitting: Nurse Practitioner

## 2021-09-06 ENCOUNTER — Non-Acute Institutional Stay (SKILLED_NURSING_FACILITY): Payer: Medicare Other | Admitting: Nurse Practitioner

## 2021-09-06 DIAGNOSIS — M25511 Pain in right shoulder: Secondary | ICD-10-CM | POA: Diagnosis not present

## 2021-09-06 DIAGNOSIS — J189 Pneumonia, unspecified organism: Secondary | ICD-10-CM | POA: Diagnosis not present

## 2021-09-06 DIAGNOSIS — F339 Major depressive disorder, recurrent, unspecified: Secondary | ICD-10-CM

## 2021-09-06 DIAGNOSIS — K219 Gastro-esophageal reflux disease without esophagitis: Secondary | ICD-10-CM

## 2021-09-06 DIAGNOSIS — Z9989 Dependence on other enabling machines and devices: Secondary | ICD-10-CM

## 2021-09-06 DIAGNOSIS — G250 Essential tremor: Secondary | ICD-10-CM | POA: Diagnosis not present

## 2021-09-06 DIAGNOSIS — G4733 Obstructive sleep apnea (adult) (pediatric): Secondary | ICD-10-CM | POA: Diagnosis not present

## 2021-09-06 DIAGNOSIS — G4752 REM sleep behavior disorder: Secondary | ICD-10-CM | POA: Diagnosis not present

## 2021-09-06 DIAGNOSIS — R627 Adult failure to thrive: Secondary | ICD-10-CM | POA: Diagnosis not present

## 2021-09-06 DIAGNOSIS — R339 Retention of urine, unspecified: Secondary | ICD-10-CM | POA: Diagnosis not present

## 2021-09-06 DIAGNOSIS — G629 Polyneuropathy, unspecified: Secondary | ICD-10-CM

## 2021-09-06 NOTE — Assessment & Plan Note (Signed)
ongoing problem, limited oral intake, may Hospice?

## 2021-09-06 NOTE — Assessment & Plan Note (Signed)
Hx of RLE/REM, stable, on Requip 2mg  qd. Sleep study 10/25/20

## 2021-09-06 NOTE — Assessment & Plan Note (Signed)
saw Neurology, uses CPAP

## 2021-09-06 NOTE — Assessment & Plan Note (Signed)
the right shoulder pain about 2-3 days, no identified trauma or injury, pain with any ROM of the right shoulder. Obtain X-ray 3 views to r/o dislocation/fx, Norco q6hrs prn for pain.

## 2021-09-06 NOTE — Assessment & Plan Note (Addendum)
stable, on Pantoprazole  

## 2021-09-06 NOTE — Assessment & Plan Note (Signed)
PNA fully treated 08/25/21 with 7 day course of Levaquin and 3 day course  of Rocephin, ST done eval and recommendation, as well as treated for possible UTI, no O2 desaturation  or abd pain today.

## 2021-09-06 NOTE — Assessment & Plan Note (Signed)
Neuropathic pain in BLE, takes Gabapentin, Tylenol, Norco,  c/o R 2nd 3rd hammer toes pain with walking, L 4th, 5th toes pain when touched, right lower back to the right leg pain when sitting in w/c for a long period of time.

## 2021-09-06 NOTE — Assessment & Plan Note (Signed)
stable, on Propranolol 10mg  qd.

## 2021-09-06 NOTE — Assessment & Plan Note (Signed)
on Wellbutrin, Mirtazapine, TSH 2.68 12/09/20

## 2021-09-06 NOTE — Assessment & Plan Note (Signed)
Foley chronic, f/u Urology

## 2021-09-06 NOTE — Progress Notes (Signed)
Location:  [facility] Room Number: [place] of Service:  SNF [phone]) Provider: Marlana Latus NP    Patient Care Team: [name], MD as PCP - General (Internal Medicine) [name], MD (Hematology and Oncology) [name], MD as Attending Physician (Urology) [name], MD (Gastroenterology)  Extended Emergency Contact Information Primary Emergency Contact: Da, Authement Mobile Phone: [phone] Relation: Son Preferred language: English Interpreter needed? No Secondary Emergency Contact: Hodsdon,Janet H Address: [address]          [address]          [place], [place] [address] [name] of [place] Phone: [phone] Mobile Phone: [phone] Relation: Spouse  Code Status:  DNR Goals of care: Advanced Directive information Advanced Directives 09/06/2021  Does Patient Have a Medical Advance Directive? Yes  Type of Paramedic of [place];Living will;Out of facility DNR (pink MOST or yellow form)  Does patient want to make changes to medical advance directive? No - Patient declined  Copy of [place] in Chart? Yes - validated most recent copy scanned in chart (See row information)  Would patient like information on creating a medical advance directive? -  Pre-existing out of facility DNR order (yellow form or pink MOST form) -     Chief Complaint  Patient presents with   Acute Visit    Right shoulder pain.     HPI:  Pt is a 87 y.o. male seen today for an acute visit for the right shoulder pain about 2-3 days, no identified trauma or injury, pain with any ROM of the right shoulder.   PNA fully treated 08/25/21 with 7 day course of Levaquin and 3 day course  of Rocephin, ST done eval and recommendation, as well as treated for possible UTI, no O2 desaturation  or abd pain today.   OSA, saw Neurology, uses CPAP             Hx of RLE/REM, stable, on Requip 2mg  qd. Sleep study  10/25/20             Essential tremor, stable, on Propranolol 10mg  qd.             GERD, stable, on Pantoprazole              Neuropathic pain in BLE, takes Gabapentin, Tylenol, Norco,  c/o R 2nd 3rd hammer toes pain with walking, L 4th, 5th toes pain when touched, right lower back to the right leg pain when sitting in w/c for a long period of time.              His mood is stable, on Wellbutrin, Mirtazapine, TSH 2.68 12/09/20             Foley chronic, f/u Urology              Left foot flexion contracture, f/u podiatry, Ortho.                     Constipation, prn MiraLax, daily Senna.              Hyperlipidemia, takes Mevacor             Adult failure to thrive, ongoing problem, limited oral intake, may Hospice?               Past Medical History:  Diagnosis Date   Anemia    Arthritis    AVM (arteriovenous malformation)  Barrett's esophagus    BPH (benign prostatic hyperplasia)    Cataract    Constipation    CPAP (continuous positive airway pressure) dependence    Depression    Diverticulosis    diverticular bleeding   Diverticulosis    GERD (gastroesophageal reflux disease)    History of radiation therapy    HOH (hard of hearing)    wears hearing aids   Hyperlipidemia    Hypoglycemia    Lymphocytosis 06/2012   Mastodynia    Murmur    slight   Nasal congestion    Osteoporosis    Osteoporosis    Prostate cancer ([place])    Sleep apnea    wears CPAP   Urinary retention with incomplete bladder emptying    pt uses bathroom and within 30 mins needs to go again   Vitamin D deficiency    Past Surgical History:  Procedure Laterality Date   AMPUTATION TOE Right 09/20/2017   Procedure: Right 4th toe amputation;  Surgeon: Wylene Simmer, MD;  Location: [facility];  Service: Orthopedics;  Laterality: Right;  foot block   AMPUTATION TOE Right 02/05/2019   Procedure: RIGHT GREAT TOE AMPUTATION;  Surgeon: Leandrew Koyanagi, MD;  Location: [facility];   Service: Orthopedics;  Laterality: Right;   BACK SURGERY  04/2013   back surgury     f   BLEPHAROPLASTY Bilateral 02/23/15   CATARACT EXTRACTION     COLONOSCOPY     COLONOSCOPY N/A 10/02/2017   Procedure: COLONOSCOPY;  Surgeon: Doran Stabler, MD;  Location: [facility];  Service: Gastroenterology;  Laterality: N/A;   COLONOSCOPY WITH PROPOFOL N/A 10/01/2017   Procedure: COLONOSCOPY WITH PROPOFOL;  Surgeon: Doran Stabler, MD;  Location: [facility];  Service: Gastroenterology;  Laterality: N/A;   COLONOSCOPY WITH PROPOFOL N/A 03/07/2018   Procedure: COLONOSCOPY WITH PROPOFOL;  Surgeon: [name], MD;  Location: [facility];  Service: Endoscopy;  Laterality: N/A;   ESOPHAGOGASTRODUODENOSCOPY (EGD) WITH PROPOFOL N/A 10/01/2017   Procedure: ESOPHAGOGASTRODUODENOSCOPY (EGD) WITH PROPOFOL;  Surgeon: Doran Stabler, MD;  Location: [facility];  Service: Gastroenterology;  Laterality: N/A;   EYE SURGERY Bilateral    FINGER SURGERY Right    right thumb   HARDWARE REMOVAL Right 05/15/2016   Procedure: Right Lumbar Two-Lumbar Five Removal of Hardware;  Surgeon: Kristeen Miss, MD;  Location: [facility];  Service: Neurosurgery;  Laterality: Right;  Right L2-5 removal of hardware   INGUINAL HERNIA REPAIR     x4   IR RADIOLOGIST EVAL & MGMT  07/10/2018   IR RADIOLOGIST EVAL & MGMT  09/05/2018   IR SACROPLASTY BILATERAL  07/15/2018   REPAIR SPIGELIAN HERNIA  2011   SPINAL FUSION  1996 ,2014   TONSILLECTOMY     UPPER GASTROINTESTINAL ENDOSCOPY     VASECTOMY      Allergies  Allergen Reactions   Celebrex [Celecoxib] Other (See Comments)    Created stomach ulcers.   Flexeril [Cyclobenzaprine] Other (See Comments)    Caught in hiatal hernia and caused extreme pain    Allergies as of 09/06/2021       Reactions   Celebrex [celecoxib] Other (See Comments)   Created stomach ulcers.   Flexeril [cyclobenzaprine] Other (See Comments)   Caught in hiatal hernia and caused extreme  pain        Medication List        Accurate as of September 06, 2021 11:59 PM. If you have any  questions, ask your nurse or doctor.          STOP taking these medications    levofloxacin 750 MG tablet Commonly known as: Levaquin Stopped by: Dorwin Fitzhenry X Haik Mahoney, NP       TAKE these medications    acetaminophen 500 MG tablet Commonly known as: TYLENOL Take 500 mg by mouth 3 (three) times daily.   buPROPion 200 MG 12 hr tablet Commonly known as: WELLBUTRIN SR Take 200 mg by mouth 2 (two) times daily.   carboxymethylcellul-glycerin 0.5-0.9 % ophthalmic solution Commonly known as: REFRESH OPTIVE Place 1 drop into both eyes 3 (three) times daily.   CENTRUM ADULTS PO Take 1 tablet by mouth daily.   PRESERVISION AREDS 2 PO Take 1 capsule by mouth 2 (two) times daily.   chlorhexidine 0.12 % solution Commonly known as: PERIDEX Use as directed 15 mLs in the mouth or throat at bedtime.   cholecalciferol 1000 units tablet Commonly known as: VITAMIN D Take 1,000 Units by mouth daily.   cyanocobalamin 1000 MCG tablet Take 1,000 mcg by mouth daily.   dextromethorphan-guaiFENesin 30-600 MG 12hr tablet Commonly known as: MUCINEX DM Take 1 tablet by mouth 2 (two) times daily as needed.   diclofenac Sodium 1 % Gel Commonly known as: VOLTAREN Apply 2 g topically 4 (four) times daily.   feeding supplement (PRO-STAT SUGAR FREE 64) Liqd Take 30 mLs by mouth in the morning and at bedtime.   gabapentin 100 MG capsule Commonly known as: NEURONTIN Take 2 capsules (200 mg total) by mouth 2 (two) times daily.   HYDROcodone-acetaminophen 5-325 MG tablet Commonly known as: NORCO/VICODIN Take one tablet by mouth every 6 hours as needed for moderate pain. Do not exceed 4gm of Tylenol in 24 hours.   lactase 3000 units tablet Commonly known as: LACTAID Take 4,500 Units by mouth 3 (three) times daily with meals.   loperamide 2 MG capsule Commonly known as: IMODIUM Take 2 mg by mouth as  needed for diarrhea or loose stools.   lovastatin 20 MG tablet Commonly known as: MEVACOR Take 20 mg by mouth at bedtime.   melatonin 5 MG Tabs Take 1 tablet (5 mg total) by mouth at bedtime.   mirtazapine 7.5 MG tablet Commonly known as: REMERON Take 7.5 mg by mouth at bedtime.   pantoprazole 40 MG tablet Commonly known as: PROTONIX Take 40 mg by mouth every morning.   polyethylene glycol 17 g packet Commonly known as: MIRALAX / GLYCOLAX Take 17 g by mouth daily as needed.   propranolol 10 MG tablet Commonly known as: INDERAL Take 10 mg by mouth daily.   rOPINIRole 1 MG tablet Commonly known as: Requip Take 2 tablets (2 mg total) by mouth at bedtime.   senna 8.6 MG tablet Commonly known as: SENOKOT Take 1 tablet by mouth daily.   zinc oxide 20 % ointment Apply 1 application topically as needed for irritation.        Review of Systems  Constitutional:  Positive for appetite change, fatigue and unexpected weight change. Negative for fever.       Gradual weight loss  HENT:  Positive for hearing loss and trouble swallowing. Negative for congestion and postnasal drip.   Eyes:  Negative for visual disturbance.  Respiratory:  Positive for shortness of breath. Negative for cough and wheezing.        Chronic non productive cough. DOE  Cardiovascular:  Negative for leg swelling.  Gastrointestinal:  Negative for abdominal pain and constipation.  Fecal incontinent.   Genitourinary:  Positive for difficulty urinating. Negative for hematuria.       Foley  Musculoskeletal:  Positive for arthralgias, back pain and gait problem. Negative for joint swelling.       R 2nd 3rd hammer toes pain, L 4th, 5th toes pain when touched, right lower back to the right leg pain when sitting in w/c for a long period of time. R shoulder pain with any ROM  Skin:  Negative for color change.  Neurological:  Positive for tremors. Negative for speech difficulty and light-headedness.        Memory lapses. Essential tremor, peripheral neuropathy, RLS  Psychiatric/Behavioral:  Negative for behavioral problems and sleep disturbance.    Immunization History  Administered Date(s) Administered   Influenza, High Dose Seasonal PF 09/17/2019   Influenza-Unspecified 09/16/2018, 09/07/2020   Moderna SARS-COV2 Booster Vaccination 10/18/2020, 05/11/2021   Moderna Sars-Covid-2 Vaccination 12/08/2019, 01/05/2020   Pneumococcal Conjugate-13 12/29/2013   Pneumococcal Polysaccharide-23 09/08/2005   Tdap 05/25/2005, 08/08/2018   Zoster Recombinat (Shingrix) 10/17/2017, 01/25/2018   Pertinent  Health Maintenance Due  Topic Date Due   OPHTHALMOLOGY EXAM  Never done   INFLUENZA VACCINE  07/04/2021   FOOT EXAM  08/17/2022   HEMOGLOBIN A1C  Discontinued   URINE MICROALBUMIN  Discontinued   Fall Risk  07/20/2021 01/01/2019 09/24/2018 01/04/2017 10/06/2016  Falls in the past year? 1 0 No No No  Number falls in past yr: 0 - - - -  Injury with Fall? 0 - - - -  Risk for fall due to : History of fall(s);Impaired balance/gait;Impaired mobility - - - -  Follow up Falls evaluation completed;Education provided;Falls prevention discussed - - - -   Functional Status Survey:    Vitals:   09/06/21 1607  BP: 117/68  Pulse: 67  Resp: 16  Temp: 98.2 F (36.8 C)  SpO2: 96%  Weight: 142 lb 3.2 oz (64.5 kg)  Height: 5\' 6"  (1.676 m)   Body mass index is 22.95 kg/m. Physical Exam Vitals and nursing note reviewed.  Constitutional:      Comments: Exhausted appearance, able to speak a full sentence.   HENT:     Head: Normocephalic and atraumatic.     Nose: Nose normal.     Mouth/Throat:     Mouth: Mucous membranes are dry.  Eyes:     Extraocular Movements: Extraocular movements intact.     Conjunctiva/sclera: Conjunctivae normal.     Pupils: Pupils are equal, round, and reactive to light.  Cardiovascular:     Rate and Rhythm: Normal rate and regular rhythm.     Heart sounds: Murmur heard.      Comments: Weak DP pulse on the right Pulmonary:     Effort: Pulmonary effort is normal.     Breath sounds: Rales present.     Comments: Posterior left basilar rales.  Abdominal:     General: Bowel sounds are normal.     Palpations: Abdomen is soft.     Tenderness: There is no abdominal tenderness.  Genitourinary:    Comments: Foley, dark red urine in the foley collecting bad.  Musculoskeletal:        General: Tenderness present.     Cervical back: Normal range of motion and neck supple.     Right lower leg: No edema.     Left lower leg: No edema.     Comments: S/p the right great and 4th toe amputation. R 2nd 3rd hammer toes pain, L 4th,  5th toes pain when touched, right lower back to the right leg pain when sitting in w/c for a long period of time. Left foot flexion contracture. R shoulder pain with any ROM  Skin:    General: Skin is warm and dry.  Neurological:     General: No focal deficit present.     Mental Status: He is alert. Mental status is at baseline.     Gait: Gait abnormal.     Comments: Oriented to person, place.   Psychiatric:        Mood and Affect: Mood normal.        Behavior: Behavior normal.        Thought Content: Thought content normal.        Judgment: Judgment normal.    Labs reviewed: Recent Labs    06/08/21 0000 08/23/21 0000 08/25/21 0000  NA 135* 137 141  K 4.2 3.3* 3.8  CL 104 106 108  CO2 21 21 21   BUN 17 24* 21  CREATININE 0.6 0.9 0.8  CALCIUM 8.8 8.3* 8.1*   Recent Labs    06/08/21 0000 08/23/21 0000 08/25/21 0000  AST 27 29 37  ALT 11 11 15   ALKPHOS 90 99 112  ALBUMIN 3.4* 3.1* 2.9*   Recent Labs    05/16/21 0000 06/08/21 0000 08/23/21 0000 08/25/21 0000  WBC 7.5 8.3 16.6 16.3  NEUTROABS 5,460.00 6,574.00 14,907.00 14,279.00  HGB 11.8* 12.3* 11.0* 11.8*  HCT 36* 37* 34* 36*  PLT 258 259 238  --    Lab Results  Component Value Date   TSH 2.68 12/10/2020   Lab Results  Component Value Date   HGBA1C 5.2 08/29/2018    Lab Results  Component Value Date   CHOL 158 08/29/2018   HDL 34 (A) 05/06/2020   LDLCALC 103 05/06/2020   TRIG 154 05/06/2020    Significant Diagnostic Results in last 30 days:  No results found.  Assessment/Plan Right shoulder pain  the right shoulder pain about 2-3 days, no identified trauma or injury, pain with any ROM of the right shoulder. Obtain X-ray 3 views to r/o dislocation/fx, Norco q6hrs prn for pain.   Pneumonia involving left lung PNA fully treated 08/25/21 with 7 day course of Levaquin and 3 day course  of Rocephin, ST done eval and recommendation, as well as treated for possible UTI, no O2 desaturation  or abd pain today.   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 Sleep study 10/25/20  Essential tremor stable, on Propranolol 10mg  qd.  GERD stable, on Pantoprazole   Neuropathy Neuropathic pain in BLE, takes Gabapentin, Tylenol, Norco,  c/o R 2nd 3rd hammer toes pain with walking, L 4th, 5th toes pain when touched, right lower back to the right leg pain when sitting in w/c for a long period of time.   Depression, recurrent ([place]) on Wellbutrin, Mirtazapine, TSH 2.68 12/09/20  Urinary retention Foley chronic, f/u Urology   Adult failure to thrive ongoing problem, limited oral intake, may Hospice?    Family/ staff Communication: plan of care reviewed with the patient and charge nurse.   Labs/tests ordered: X-ray 3 views R shoulder.  Time spend 35 minutes.

## 2021-09-07 ENCOUNTER — Encounter: Payer: Self-pay | Admitting: Nurse Practitioner

## 2021-09-08 ENCOUNTER — Encounter: Payer: Self-pay | Admitting: Internal Medicine

## 2021-09-08 ENCOUNTER — Non-Acute Institutional Stay (SKILLED_NURSING_FACILITY): Payer: Medicare Other | Admitting: Internal Medicine

## 2021-09-08 DIAGNOSIS — R339 Retention of urine, unspecified: Secondary | ICD-10-CM

## 2021-09-08 DIAGNOSIS — R634 Abnormal weight loss: Secondary | ICD-10-CM

## 2021-09-08 DIAGNOSIS — F339 Major depressive disorder, recurrent, unspecified: Secondary | ICD-10-CM | POA: Diagnosis not present

## 2021-09-08 DIAGNOSIS — G2581 Restless legs syndrome: Secondary | ICD-10-CM

## 2021-09-08 DIAGNOSIS — R1319 Other dysphagia: Secondary | ICD-10-CM | POA: Diagnosis not present

## 2021-09-08 DIAGNOSIS — R627 Adult failure to thrive: Secondary | ICD-10-CM

## 2021-09-08 NOTE — Progress Notes (Signed)
Location:   [facility] Room Number: 26 Place of Service:  SNF [phone]) Provider:  Veleta Miners MD  [name], MD  Patient Care Team: [name], MD as PCP - General (Internal Medicine) [name], MD (Hematology and Oncology) [name], MD as Attending Physician (Urology) [name], MD (Gastroenterology)  Extended Emergency Contact Information Primary Emergency Contact: Rico, Massar Mobile Phone: [phone] Relation: Son Preferred language: English Interpreter needed? No Secondary Emergency Contact: Canche,Janet H Address: [address]          [address]          [place], [place] [address] [name] of [place] Phone: [phone] Mobile Phone: [phone] Relation: Spouse  Code Status:  DNR Goals of care: Advanced Directive information Advanced Directives 09/08/2021  Does Patient Have a Medical Advance Directive? Yes  Type of Paramedic of [place];Living will;Out of facility DNR (pink MOST or yellow form)  Does patient want to make changes to medical advance directive? No - Patient declined  Copy of [place] in Chart? Yes - validated most recent copy scanned in chart (See row information)  Would patient like information on creating a medical advance directive? -  Pre-existing out of facility DNR order (yellow form or pink MOST form) Yellow form placed in chart (order not valid for inpatient use);Pink MOST form placed in chart (order not valid for inpatient use)     Chief Complaint  Patient presents with   Acute Visit    Not eating Discuss hospice     HPI:  Pt is a 87 y.o. male seen today for an acute visit for not eating per Nurses  Patient has number of medical problems including Chronic Diarrhea,  Barretts Esphagus,  OSA on CPAP,   h/o Prostate cancer wih Chronic Foley Catheter,  Chronic Pain due to Kyphosis and Neuropathy  S/P Right Great Toe  Amputation in 3/20 for Osteomyelitis  Patient continues to not do well. Now eating only 25% Resisting nurses feeding him Has lost another 10 lbs in past few weeks Feels weak. Needs Hoyer Lift Does not even want to sit in wheelchair Sleeps most of the time His speech seems slurred also today He was put on special dysphagia diet because of risk of aspiration but patient has refused and is now on regular diet with bite-size meats Also Was treated recently for Pneumonia Most likely Aspiration Wife had refused Hospice last visit but now more open about it and wants to talk about it Also has Stage 2 sacral  pressure wound  Past Medical History:  Diagnosis Date   Anemia    Arthritis    AVM (arteriovenous malformation)    Barrett's esophagus    BPH (benign prostatic hyperplasia)    Cataract    Constipation    CPAP (continuous positive airway pressure) dependence    Depression    Diverticulosis    diverticular bleeding   Diverticulosis    GERD (gastroesophageal reflux disease)    History of radiation therapy    HOH (hard of hearing)    wears hearing aids   Hyperlipidemia    Hypoglycemia    Lymphocytosis 06/2012   Mastodynia    Murmur    slight   Nasal congestion    Osteoporosis    Osteoporosis    Prostate cancer ([place])    Sleep apnea    wears CPAP   Urinary retention with incomplete bladder emptying  pt uses bathroom and within 30 mins needs to go again   Vitamin D deficiency    Past Surgical History:  Procedure Laterality Date   AMPUTATION TOE Right 09/20/2017   Procedure: Right 4th toe amputation;  Surgeon: Wylene Simmer, MD;  Location: [facility];  Service: Orthopedics;  Laterality: Right;  foot block   AMPUTATION TOE Right 02/05/2019   Procedure: RIGHT GREAT TOE AMPUTATION;  Surgeon: Leandrew Koyanagi, MD;  Location: [facility];  Service: Orthopedics;  Laterality: Right;   BACK SURGERY  04/2013   back surgury     f   BLEPHAROPLASTY Bilateral  02/23/15   CATARACT EXTRACTION     COLONOSCOPY     COLONOSCOPY N/A 10/02/2017   Procedure: COLONOSCOPY;  Surgeon: Doran Stabler, MD;  Location: [facility];  Service: Gastroenterology;  Laterality: N/A;   COLONOSCOPY WITH PROPOFOL N/A 10/01/2017   Procedure: COLONOSCOPY WITH PROPOFOL;  Surgeon: Doran Stabler, MD;  Location: [facility];  Service: Gastroenterology;  Laterality: N/A;   COLONOSCOPY WITH PROPOFOL N/A 03/07/2018   Procedure: COLONOSCOPY WITH PROPOFOL;  Surgeon: [name], MD;  Location: [facility];  Service: Endoscopy;  Laterality: N/A;   ESOPHAGOGASTRODUODENOSCOPY (EGD) WITH PROPOFOL N/A 10/01/2017   Procedure: ESOPHAGOGASTRODUODENOSCOPY (EGD) WITH PROPOFOL;  Surgeon: Doran Stabler, MD;  Location: [facility];  Service: Gastroenterology;  Laterality: N/A;   EYE SURGERY Bilateral    FINGER SURGERY Right    right thumb   HARDWARE REMOVAL Right 05/15/2016   Procedure: Right Lumbar Two-Lumbar Five Removal of Hardware;  Surgeon: Kristeen Miss, MD;  Location: [facility];  Service: Neurosurgery;  Laterality: Right;  Right L2-5 removal of hardware   INGUINAL HERNIA REPAIR     x4   IR RADIOLOGIST EVAL & MGMT  07/10/2018   IR RADIOLOGIST EVAL & MGMT  09/05/2018   IR SACROPLASTY BILATERAL  07/15/2018   REPAIR SPIGELIAN HERNIA  2011   SPINAL FUSION  1996 ,2014   TONSILLECTOMY     UPPER GASTROINTESTINAL ENDOSCOPY     VASECTOMY      Allergies  Allergen Reactions   Celebrex [Celecoxib] Other (See Comments)    Created stomach ulcers.   Flexeril [Cyclobenzaprine] Other (See Comments)    Caught in hiatal hernia and caused extreme pain    Allergies as of 09/08/2021       Reactions   Celebrex [celecoxib] Other (See Comments)   Created stomach ulcers.   Flexeril [cyclobenzaprine] Other (See Comments)   Caught in hiatal hernia and caused extreme pain        Medication List        Accurate as of September 08, 2021  4:37 PM. If you have any questions, ask your  nurse or doctor.          STOP taking these medications    loperamide 2 MG capsule Commonly known as: IMODIUM Stopped by: [name], MD       TAKE these medications    acetaminophen 500 MG tablet Commonly known as: TYLENOL Take 500 mg by mouth 2 (two) times daily.   buPROPion 200 MG 12 hr tablet Commonly known as: WELLBUTRIN SR Take 200 mg by mouth 2 (two) times daily.   carbamide peroxide 6.5 % OTIC solution Commonly known as: DEBROX 5 drops 2 (two) times daily.   carboxymethylcellul-glycerin 0.5-0.9 % ophthalmic solution Commonly known as: REFRESH OPTIVE Place 1 drop into both eyes 3 (three) times daily.   CENTRUM  ADULTS PO Take 1 tablet by mouth daily.   PRESERVISION AREDS 2 PO Take 1 capsule by mouth 2 (two) times daily.   chlorhexidine 0.12 % solution Commonly known as: PERIDEX Use as directed 15 mLs in the mouth or throat at bedtime.   cholecalciferol 1000 units tablet Commonly known as: VITAMIN D Take 1,000 Units by mouth daily.   cyanocobalamin 1000 MCG tablet Take 1,000 mcg by mouth daily.   dextromethorphan-guaiFENesin 30-600 MG 12hr tablet Commonly known as: MUCINEX DM Take 1 tablet by mouth 2 (two) times daily as needed.   diclofenac Sodium 1 % Gel Commonly known as: VOLTAREN Apply 2 g topically 4 (four) times daily.   feeding supplement (PRO-STAT SUGAR FREE 64) Liqd Take 30 mLs by mouth in the morning and at bedtime.   gabapentin 100 MG capsule Commonly known as: NEURONTIN Take 2 capsules (200 mg total) by mouth 2 (two) times daily.   HYDROcodone-acetaminophen 5-325 MG tablet Commonly known as: NORCO/VICODIN Take one tablet by mouth every 6 hours as needed for moderate pain. Do not exceed 4gm of Tylenol in 24 hours.   lactase 3000 units tablet Commonly known as: LACTAID Take 4,500 Units by mouth 3 (three) times daily with meals.   lovastatin 20 MG tablet Commonly known as: MEVACOR Take 20 mg by mouth at bedtime.    melatonin 5 MG Tabs Take 1 tablet (5 mg total) by mouth at bedtime.   mirtazapine 7.5 MG tablet Commonly known as: REMERON Take 7.5 mg by mouth at bedtime.   pantoprazole 40 MG tablet Commonly known as: PROTONIX Take 40 mg by mouth every morning.   polyethylene glycol 17 g packet Commonly known as: MIRALAX / GLYCOLAX Take 17 g by mouth daily as needed.   propranolol 10 MG tablet Commonly known as: INDERAL Take 10 mg by mouth daily.   rOPINIRole 1 MG tablet Commonly known as: Requip Take 2 tablets (2 mg total) by mouth at bedtime.   senna 8.6 MG tablet Commonly known as: SENOKOT Take 1 tablet by mouth daily.   zinc oxide 20 % ointment Apply 1 application topically as needed for irritation.        Review of Systems  Constitutional:  Positive for activity change, appetite change and unexpected weight change.  HENT: Negative.    Respiratory: Negative.    Cardiovascular: Negative.   Gastrointestinal: Negative.   Genitourinary: Negative.   Musculoskeletal:  Positive for gait problem.  Skin:  Positive for wound.  Neurological:  Positive for weakness.  Psychiatric/Behavioral:  Positive for confusion and dysphoric mood.    Immunization History  Administered Date(s) Administered   Influenza, High Dose Seasonal PF 09/17/2019   Influenza-Unspecified 09/16/2018, 09/07/2020   Moderna SARS-COV2 Booster Vaccination 10/18/2020, 05/11/2021   Moderna Sars-Covid-2 Vaccination 12/08/2019, 01/05/2020   Pneumococcal Conjugate-13 12/29/2013   Pneumococcal Polysaccharide-23 09/08/2005   Tdap 05/25/2005, 08/08/2018   Zoster Recombinat (Shingrix) 10/17/2017, 01/25/2018   Pertinent  Health Maintenance Due  Topic Date Due   OPHTHALMOLOGY EXAM  Never done   INFLUENZA VACCINE  07/04/2021   FOOT EXAM  08/17/2022   HEMOGLOBIN A1C  Discontinued   URINE MICROALBUMIN  Discontinued   Fall Risk  07/20/2021 01/01/2019 09/24/2018 01/04/2017 10/06/2016  Falls in the past year? 1 0 No No No   Number falls in past yr: 0 - - - -  Injury with Fall? 0 - - - -  Risk for fall due to : History of fall(s);Impaired balance/gait;Impaired mobility - - - -  Follow up Falls evaluation completed;Education provided;Falls prevention discussed - - - -   Functional Status Survey:    Vitals:   09/08/21 1610  BP: (!) 92/52  Pulse: (!) 54  Resp: 20  Temp: 97.9 F (36.6 C)  SpO2: 94%  Weight: 130 lb 8 oz (59.2 kg)  Height: 5\' 6"  (1.676 m)   Body mass index is 21.06 kg/m. Physical Exam Vitals reviewed.  Constitutional:      Comments: Very frail  HENT:     Head: Normocephalic.     Nose: Nose normal.     Mouth/Throat:     Mouth: Mucous membranes are moist.     Pharynx: Oropharynx is clear.  Eyes:     Pupils: Pupils are equal, round, and reactive to light.  Cardiovascular:     Rate and Rhythm: Normal rate and regular rhythm.     Pulses: Normal pulses.     Heart sounds: No murmur heard. Pulmonary:     Effort: Pulmonary effort is normal.     Breath sounds: Normal breath sounds.  Abdominal:     General: Abdomen is flat. Bowel sounds are normal.     Palpations: Abdomen is soft.  Musculoskeletal:        General: No swelling.     Cervical back: Neck supple.  Skin:    General: Skin is warm and dry.  Neurological:     General: No focal deficit present.     Mental Status: He is alert.     Comments: His speech seemed slow and Slurred Continues to be oriented Very weak He told me he does not want to live like this  Psychiatric:        Mood and Affect: Mood normal.    Labs reviewed: Recent Labs    06/08/21 0000 08/23/21 0000 08/25/21 0000  NA 135* 137 141  K 4.2 3.3* 3.8  CL 104 106 108  CO2 21 21 21   BUN 17 24* 21  CREATININE 0.6 0.9 0.8  CALCIUM 8.8 8.3* 8.1*   Recent Labs    06/08/21 0000 08/23/21 0000 08/25/21 0000  AST 27 29 37  ALT 11 11 15   ALKPHOS 90 99 112  ALBUMIN 3.4* 3.1* 2.9*   Recent Labs    05/16/21 0000 06/08/21 0000 08/23/21 0000  08/25/21 0000  WBC 7.5 8.3 16.6 16.3  NEUTROABS 5,460.00 6,574.00 14,907.00 14,279.00  HGB 11.8* 12.3* 11.0* 11.8*  HCT 36* 37* 34* 36*  PLT 258 259 238  --    Lab Results  Component Value Date   TSH 2.68 12/10/2020   Lab Results  Component Value Date   HGBA1C 5.2 08/29/2018   Lab Results  Component Value Date   CHOL 158 08/29/2018   HDL 34 (A) 05/06/2020   LDLCALC 103 05/06/2020   TRIG 154 05/06/2020    Significant Diagnostic Results in last 30 days:  No results found.  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

## 2021-09-12 DIAGNOSIS — A0471 Enterocolitis due to Clostridium difficile, recurrent: Secondary | ICD-10-CM | POA: Diagnosis not present

## 2021-09-12 DIAGNOSIS — E559 Vitamin D deficiency, unspecified: Secondary | ICD-10-CM | POA: Diagnosis not present

## 2021-09-12 DIAGNOSIS — K0510 Chronic gingivitis, plaque induced: Secondary | ICD-10-CM | POA: Diagnosis not present

## 2021-09-12 DIAGNOSIS — Z87440 Personal history of urinary (tract) infections: Secondary | ICD-10-CM | POA: Diagnosis not present

## 2021-09-12 DIAGNOSIS — G4733 Obstructive sleep apnea (adult) (pediatric): Secondary | ICD-10-CM | POA: Diagnosis not present

## 2021-09-12 DIAGNOSIS — E43 Unspecified severe protein-calorie malnutrition: Secondary | ICD-10-CM | POA: Diagnosis not present

## 2021-09-12 DIAGNOSIS — J690 Pneumonitis due to inhalation of food and vomit: Secondary | ICD-10-CM | POA: Diagnosis not present

## 2021-09-12 DIAGNOSIS — Z8546 Personal history of malignant neoplasm of prostate: Secondary | ICD-10-CM | POA: Diagnosis not present

## 2021-09-12 DIAGNOSIS — Z923 Personal history of irradiation: Secondary | ICD-10-CM | POA: Diagnosis not present

## 2021-09-12 DIAGNOSIS — H6120 Impacted cerumen, unspecified ear: Secondary | ICD-10-CM | POA: Diagnosis not present

## 2021-09-12 DIAGNOSIS — L89322 Pressure ulcer of left buttock, stage 2: Secondary | ICD-10-CM | POA: Diagnosis not present

## 2021-09-12 DIAGNOSIS — F0283 Dementia in other diseases classified elsewhere, unspecified severity, with mood disturbance: Secondary | ICD-10-CM | POA: Diagnosis not present

## 2021-09-12 DIAGNOSIS — I2510 Atherosclerotic heart disease of native coronary artery without angina pectoris: Secondary | ICD-10-CM | POA: Diagnosis not present

## 2021-09-12 DIAGNOSIS — I10 Essential (primary) hypertension: Secondary | ICD-10-CM | POA: Diagnosis not present

## 2021-09-12 DIAGNOSIS — H04129 Dry eye syndrome of unspecified lacrimal gland: Secondary | ICD-10-CM | POA: Diagnosis not present

## 2021-09-12 DIAGNOSIS — K219 Gastro-esophageal reflux disease without esophagitis: Secondary | ICD-10-CM | POA: Diagnosis not present

## 2021-09-12 DIAGNOSIS — E785 Hyperlipidemia, unspecified: Secondary | ICD-10-CM | POA: Diagnosis not present

## 2021-09-12 DIAGNOSIS — Z466 Encounter for fitting and adjustment of urinary device: Secondary | ICD-10-CM | POA: Diagnosis not present

## 2021-09-12 DIAGNOSIS — E739 Lactose intolerance, unspecified: Secondary | ICD-10-CM | POA: Diagnosis not present

## 2021-09-12 DIAGNOSIS — G311 Senile degeneration of brain, not elsewhere classified: Secondary | ICD-10-CM | POA: Diagnosis not present

## 2021-09-12 DIAGNOSIS — M1990 Unspecified osteoarthritis, unspecified site: Secondary | ICD-10-CM | POA: Diagnosis not present

## 2021-09-12 DIAGNOSIS — R1310 Dysphagia, unspecified: Secondary | ICD-10-CM | POA: Diagnosis not present

## 2021-09-12 DIAGNOSIS — D649 Anemia, unspecified: Secondary | ICD-10-CM | POA: Diagnosis not present

## 2021-09-12 DIAGNOSIS — Z6820 Body mass index (BMI) 20.0-20.9, adult: Secondary | ICD-10-CM | POA: Diagnosis not present

## 2021-09-12 DIAGNOSIS — K2270 Barrett's esophagus without dysplasia: Secondary | ICD-10-CM | POA: Diagnosis not present

## 2021-09-13 DIAGNOSIS — R1310 Dysphagia, unspecified: Secondary | ICD-10-CM | POA: Diagnosis not present

## 2021-09-13 DIAGNOSIS — F0283 Dementia in other diseases classified elsewhere, unspecified severity, with mood disturbance: Secondary | ICD-10-CM | POA: Diagnosis not present

## 2021-09-13 DIAGNOSIS — L89322 Pressure ulcer of left buttock, stage 2: Secondary | ICD-10-CM | POA: Diagnosis not present

## 2021-09-13 DIAGNOSIS — E43 Unspecified severe protein-calorie malnutrition: Secondary | ICD-10-CM | POA: Diagnosis not present

## 2021-09-13 DIAGNOSIS — A0471 Enterocolitis due to Clostridium difficile, recurrent: Secondary | ICD-10-CM | POA: Diagnosis not present

## 2021-09-13 DIAGNOSIS — G311 Senile degeneration of brain, not elsewhere classified: Secondary | ICD-10-CM | POA: Diagnosis not present

## 2021-09-14 ENCOUNTER — Encounter: Payer: Self-pay | Admitting: Orthopedic Surgery

## 2021-09-14 ENCOUNTER — Non-Acute Institutional Stay (SKILLED_NURSING_FACILITY): Payer: Medicare Other | Admitting: Orthopedic Surgery

## 2021-09-14 DIAGNOSIS — L89322 Pressure ulcer of left buttock, stage 2: Secondary | ICD-10-CM

## 2021-09-14 DIAGNOSIS — R627 Adult failure to thrive: Secondary | ICD-10-CM | POA: Diagnosis not present

## 2021-09-14 DIAGNOSIS — G2581 Restless legs syndrome: Secondary | ICD-10-CM

## 2021-09-14 DIAGNOSIS — F0283 Dementia in other diseases classified elsewhere, unspecified severity, with mood disturbance: Secondary | ICD-10-CM | POA: Diagnosis not present

## 2021-09-14 DIAGNOSIS — A0471 Enterocolitis due to Clostridium difficile, recurrent: Secondary | ICD-10-CM | POA: Diagnosis not present

## 2021-09-14 DIAGNOSIS — E43 Unspecified severe protein-calorie malnutrition: Secondary | ICD-10-CM | POA: Diagnosis not present

## 2021-09-14 DIAGNOSIS — R339 Retention of urine, unspecified: Secondary | ICD-10-CM

## 2021-09-14 DIAGNOSIS — G311 Senile degeneration of brain, not elsewhere classified: Secondary | ICD-10-CM | POA: Diagnosis not present

## 2021-09-14 DIAGNOSIS — R1310 Dysphagia, unspecified: Secondary | ICD-10-CM | POA: Diagnosis not present

## 2021-09-14 DIAGNOSIS — K219 Gastro-esophageal reflux disease without esophagitis: Secondary | ICD-10-CM

## 2021-09-14 DIAGNOSIS — R634 Abnormal weight loss: Secondary | ICD-10-CM | POA: Diagnosis not present

## 2021-09-14 DIAGNOSIS — R1319 Other dysphagia: Secondary | ICD-10-CM

## 2021-09-14 DIAGNOSIS — F339 Major depressive disorder, recurrent, unspecified: Secondary | ICD-10-CM

## 2021-09-14 DIAGNOSIS — M25511 Pain in right shoulder: Secondary | ICD-10-CM

## 2021-09-14 NOTE — Progress Notes (Signed)
Location:   [place] Room Number: 26 Place of Service:  SNF ([id number]) Provider:  Windell Moulding, NP  Charles Dad, MD  Patient Care Team: Charles Dad, MD as PCP - General (Internal Medicine) Charles Putnam, MD (Hematology and Oncology) [name], MD as Attending Physician (Urology) [name], MD (Gastroenterology)  Extended Emergency Contact Information Primary Emergency Contact: [name] Mobile Phone: [phone] Relation: Son Preferred language: English Interpreter needed? No Secondary Emergency Contact: [name] Address: [address]          [address]          [name], [place] [address] [name] Phone: [phone] Mobile Phone: [phone] Relation: Spouse  Code Status:  DNR Goals of care: Advanced Directive information Advanced Directives 09/14/2021  Does Patient Have a Medical Advance Directive? Yes  Type of Paramedic of [place];Living will;Out of facility DNR (pink MOST or yellow form)  Does patient want to make changes to medical advance directive? No - Patient declined  Copy of [place] in Chart? Yes - validated most recent copy scanned in chart (See row information)  Would patient like information on creating a medical advance directive? -  Pre-existing out of facility DNR order (yellow form or pink MOST form) Pink MOST form placed in chart (order not valid for inpatient use);Yellow form placed in chart (order not valid for inpatient use)     No chief complaint on file.   HPI:  Pt is a 87 y.o. male seen today for medical management of chronic diseases.    He currently resides in the skilled nursing unit at [facility]. Past medical history includes: OSA, neuropathy, REM sleep behavior disorder, essential tremor, GERD, depression, constipation, hyperlipidemia, left foot contracture, and adult failure to thrive.  Adult failure to  thrive- enrolled in hospice 09/12/2021, he is unable to get out of bed, requires hoyer lift, staff had been assisting him with feedings but continues to eat poorly, speech is more slurred today, he answers questions appropriately, last BIMS 8 09/09/2021 Pressure ulcer- followed by wound nurse, pressure ulcer to left buttocks, Allevyn dressings daily, sometimes refuses position changes Weight loss- lost over 25 lbs in last 6 months, reports no appetite, unsuccessful supervised meals, remains on Boost and Remeron Dysphagia- ST recommended dysphagia diet but refused in past to promote eating, no recent aspirations, remains on regular diet with bite sized meats, left lower pneumonia 08/2021- resolved with Levaquin and Rocephin- question aspiration PNA Urinary retention -chronic foley RLS- remains on ropinirole GERD- no increased reflux, remains on Protonix Depression- flat affect today, he did smile a few times, remains on Wellbutrin and Remeron Right shoulder pain- X-ray negative for dislocation or fracture, continues to have pain with movement, remains on norco prn  No recent falls, injures, or behavioral outbursts.   Recent blood pressures:  10/04- 92/52  09/27- 117/68  09/22- 118/70  Recent weights:  10/12- 128.5 lbs  09/02- 142.1 lbs  08/01- 137.1 lbs  03/01- 160 lbs      Past Medical History:  Diagnosis Date   Anemia    Arthritis    AVM (arteriovenous malformation)    Barrett's esophagus    BPH (benign prostatic hyperplasia)    Cataract    Constipation    CPAP (continuous positive airway pressure) dependence    Depression    Diverticulosis    diverticular bleeding   Diverticulosis    GERD (gastroesophageal  reflux disease)    History of radiation therapy    HOH (hard of hearing)    wears hearing aids   Hyperlipidemia    Hypoglycemia    Lymphocytosis 06/2012   Mastodynia    Murmur    slight   Nasal congestion    Osteoporosis    Osteoporosis    Prostate cancer  ([place])    Sleep apnea    wears CPAP   Urinary retention with incomplete bladder emptying    pt uses bathroom and within 30 mins needs to go again   Vitamin D deficiency    Past Surgical History:  Procedure Laterality Date   AMPUTATION TOE Right 09/20/2017   Procedure: Right 4th toe amputation;  Surgeon: Wylene Simmer, MD;  Location: [place];  Service: Orthopedics;  Laterality: Right;  foot block   AMPUTATION TOE Right 02/05/2019   Procedure: RIGHT GREAT TOE AMPUTATION;  Surgeon: Leandrew Koyanagi, MD;  Location: [place];  Service: Orthopedics;  Laterality: Right;   BACK SURGERY  04/2013   back surgury     f   BLEPHAROPLASTY Bilateral 02/23/15   CATARACT EXTRACTION     COLONOSCOPY     COLONOSCOPY N/A 10/02/2017   Procedure: COLONOSCOPY;  Surgeon: Doran Stabler, MD;  Location: [facility];  Service: Gastroenterology;  Laterality: N/A;   COLONOSCOPY WITH PROPOFOL N/A 10/01/2017   Procedure: COLONOSCOPY WITH PROPOFOL;  Surgeon: Doran Stabler, MD;  Location: [facility];  Service: Gastroenterology;  Laterality: N/A;   COLONOSCOPY WITH PROPOFOL N/A 03/07/2018   Procedure: COLONOSCOPY WITH PROPOFOL;  Surgeon: [name], MD;  Location: [facility];  Service: Endoscopy;  Laterality: N/A;   ESOPHAGOGASTRODUODENOSCOPY (EGD) WITH PROPOFOL N/A 10/01/2017   Procedure: ESOPHAGOGASTRODUODENOSCOPY (EGD) WITH PROPOFOL;  Surgeon: Doran Stabler, MD;  Location: [facility];  Service: Gastroenterology;  Laterality: N/A;   EYE SURGERY Bilateral    FINGER SURGERY Right    right thumb   HARDWARE REMOVAL Right 05/15/2016   Procedure: Right Lumbar Two-Lumbar Five Removal of Hardware;  Surgeon: Kristeen Miss, MD;  Location: [facility];  Service: Neurosurgery;  Laterality: Right;  Right L2-5 removal of hardware   INGUINAL HERNIA REPAIR     x4   IR RADIOLOGIST EVAL & MGMT  07/10/2018   IR RADIOLOGIST EVAL & MGMT  09/05/2018   IR SACROPLASTY BILATERAL  07/15/2018    REPAIR SPIGELIAN HERNIA  2011   SPINAL FUSION  1996 ,2014   TONSILLECTOMY     UPPER GASTROINTESTINAL ENDOSCOPY     VASECTOMY      Allergies  Allergen Reactions   Celebrex [Celecoxib] Other (See Comments)    Created stomach ulcers.   Flexeril [Cyclobenzaprine] Other (See Comments)    Caught in hiatal hernia and caused extreme pain    Allergies as of 09/14/2021       Reactions   Celebrex [celecoxib] Other (See Comments)   Created stomach ulcers.   Flexeril [cyclobenzaprine] Other (See Comments)   Caught in hiatal hernia and caused extreme pain        Medication List        Accurate as of September 14, 2021 12:09 PM. If you have any questions, ask your nurse or doctor.          STOP taking these medications    CENTRUM ADULTS PO Stopped by: Yvonna Alanis, NP   chlorhexidine 0.12 % solution Commonly known as: PERIDEX Stopped by: Yvonna Alanis, NP  cholecalciferol 1000 units tablet Commonly known as: VITAMIN D Stopped by: Yvonna Alanis, NP   cyanocobalamin 1000 MCG tablet Stopped by: Yvonna Alanis, NP   lovastatin 20 MG tablet Commonly known as: MEVACOR Stopped by: Yvonna Alanis, NP   PRESERVISION AREDS 2 PO Stopped by: Yvonna Alanis, NP       TAKE these medications    acetaminophen 500 MG tablet Commonly known as: TYLENOL Take 500 mg by mouth 2 (two) times daily.   buPROPion 200 MG 12 hr tablet Commonly known as: WELLBUTRIN SR Take 200 mg by mouth 2 (two) times daily.   carbamide peroxide 6.5 % OTIC solution Commonly known as: DEBROX 5 drops 2 (two) times daily.   carboxymethylcellul-glycerin 0.5-0.9 % ophthalmic solution Commonly known as: REFRESH OPTIVE Place 1 drop into both eyes 3 (three) times daily.   dextromethorphan-guaiFENesin 30-600 MG 12hr tablet Commonly known as: MUCINEX DM Take 1 tablet by mouth 2 (two) times daily as needed.   diclofenac Sodium 1 % Gel Commonly known as: VOLTAREN Apply 2 g topically 4 (four) times daily.    feeding supplement (PRO-STAT SUGAR FREE 64) Liqd Take 30 mLs by mouth in the morning and at bedtime.   gabapentin 100 MG capsule Commonly known as: NEURONTIN Take 2 capsules (200 mg total) by mouth 2 (two) times daily.   HYDROcodone-acetaminophen 5-325 MG tablet Commonly known as: NORCO/VICODIN Take one tablet by mouth every 6 hours as needed for moderate pain. Do not exceed 4gm of Tylenol in 24 hours.   lactase 3000 units tablet Commonly known as: LACTAID Take 4,500 Units by mouth 3 (three) times daily with meals.   melatonin 5 MG Tabs Take 1 tablet (5 mg total) by mouth at bedtime.   Mintox 093-818-29 MG/5ML suspension Generic drug: alum & mag hydroxide-simeth Take 30 mLs by mouth every 6 (six) hours as needed for indigestion or heartburn. Maalox suspension 30 ml by mouth every 4 hours PRN indigestion/stomach upset x 48 hours. Notify MD if symptoms continue.   mirtazapine 7.5 MG tablet Commonly known as: REMERON Take 7.5 mg by mouth at bedtime.   pantoprazole 40 MG tablet Commonly known as: PROTONIX Take 40 mg by mouth every morning.   polyethylene glycol 17 g packet Commonly known as: MIRALAX / GLYCOLAX Take 17 g by mouth daily as needed.   propranolol 10 MG tablet Commonly known as: INDERAL Take 10 mg by mouth daily.   rOPINIRole 1 MG tablet Commonly known as: Requip Take 2 tablets (2 mg total) by mouth at bedtime.   senna 8.6 MG tablet Commonly known as: SENOKOT Take 1 tablet by mouth daily.   zinc oxide 20 % ointment Apply 1 application topically as needed for irritation.        Review of Systems  Constitutional:  Positive for activity change. Negative for appetite change, chills, fatigue and fever.  HENT:  Positive for hearing loss and trouble swallowing.   Eyes:  Negative for visual disturbance.  Respiratory:  Negative for cough, shortness of breath and wheezing.   Cardiovascular:  Negative for chest pain and leg swelling.  Gastrointestinal:   Negative for abdominal distention, abdominal pain, blood in stool, constipation, diarrhea, nausea and vomiting.  Genitourinary:  Negative for hematuria.       Indwelling catheter  Musculoskeletal:  Positive for arthralgias and gait problem.       Right shoulder pain  Skin:        Pressure ulcer  Neurological:  Positive for  weakness and numbness. Negative for dizziness and headaches.  Psychiatric/Behavioral:  Positive for confusion and dysphoric mood. Negative for sleep disturbance. The patient is not nervous/anxious.    Immunization History  Administered Date(s) Administered   Influenza, High Dose Seasonal PF 09/17/2019   Influenza-Unspecified 09/16/2018, 09/07/2020   Moderna SARS-COV2 Booster Vaccination 10/18/2020, 05/11/2021   Moderna Sars-Covid-2 Vaccination 12/08/2019, 01/05/2020   Pneumococcal Conjugate-13 12/29/2013   Pneumococcal Polysaccharide-23 09/08/2005   Tdap 05/25/2005, 08/08/2018   Zoster Recombinat (Shingrix) 10/17/2017, 01/25/2018   Pertinent  Health Maintenance Due  Topic Date Due   OPHTHALMOLOGY EXAM  Never done   INFLUENZA VACCINE  07/04/2021   FOOT EXAM  08/17/2022   HEMOGLOBIN A1C  Discontinued   URINE MICROALBUMIN  Discontinued   Fall Risk  07/20/2021 01/01/2019 09/24/2018 01/04/2017 10/06/2016  Falls in the past year? 1 0 No No No  Number falls in past yr: 0 - - - -  Injury with Fall? 0 - - - -  Risk for fall due to : History of fall(s);Impaired balance/gait;Impaired mobility - - - -  Follow up Falls evaluation completed;Education provided;Falls prevention discussed - - - -   Functional Status Survey:    Vitals:   09/14/21 1142  BP: (!) 92/52  Pulse: (!) 54  Temp: 97.7 F (36.5 C)  Weight: 130 lb 8 oz (59.2 kg)  Height: 5\' 6"  (1.676 m)   Body mass index is 21.06 kg/m. Physical Exam Vitals reviewed.  Constitutional:      Appearance: He is ill-appearing.     Comments: Frail  HENT:     Head: Normocephalic.     Right Ear: There is no impacted  cerumen.     Left Ear: There is no impacted cerumen.     Nose: Nose normal.     Mouth/Throat:     Mouth: Mucous membranes are moist.  Eyes:     General:        Right eye: No discharge.        Left eye: No discharge.  Neck:     Vascular: No carotid bruit.  Cardiovascular:     Rate and Rhythm: Normal rate and regular rhythm.     Pulses: Normal pulses.     Heart sounds: Normal heart sounds. No murmur heard. Pulmonary:     Effort: Pulmonary effort is normal. No respiratory distress.     Breath sounds: Normal breath sounds. No wheezing.  Abdominal:     General: Bowel sounds are normal. There is no distension.     Palpations: Abdomen is soft.     Tenderness: There is no abdominal tenderness.  Genitourinary:    Comments: Urine yellow and clear in foley bag Musculoskeletal:     Cervical back: Normal range of motion.     Right lower leg: No edema.     Left lower leg: No edema.  Lymphadenopathy:     Cervical: No cervical adenopathy.  Skin:    General: Skin is warm and dry.     Capillary Refill: Capillary refill takes less than 2 seconds.     Comments: Stage 2 pressure ulcer to left buttocks, quarter sized, skin closed, no drainage, skin non-blanchable, no odor.   Neurological:     General: No focal deficit present.     Mental Status: He is alert. Mental status is at baseline.     Motor: Weakness present.     Gait: Gait abnormal.     Comments: Bed bound  Psychiatric:  Mood and Affect: Mood normal. Affect is flat.        Behavior: Behavior normal.    Labs reviewed: Recent Labs    06/08/21 0000 08/23/21 0000 08/25/21 0000  NA 135* 137 141  K 4.2 3.3* 3.8  CL 104 106 108  CO2 21 21 21   BUN 17 24* 21  CREATININE 0.6 0.9 0.8  CALCIUM 8.8 8.3* 8.1*   Recent Labs    06/08/21 0000 08/23/21 0000 08/25/21 0000  AST 27 29 37  ALT 11 11 15   ALKPHOS 90 99 112  ALBUMIN 3.4* 3.1* 2.9*   Recent Labs    05/16/21 0000 06/08/21 0000 08/23/21 0000 08/25/21 0000  WBC  7.5 8.3 16.6 16.3  NEUTROABS 5,460.00 6,574.00 14,907.00 14,279.00  HGB 11.8* 12.3* 11.0* 11.8*  HCT 36* 37* 34* 36*  PLT 258 259 238  --    Lab Results  Component Value Date   TSH 2.68 12/10/2020   Lab Results  Component Value Date   HGBA1C 5.2 08/29/2018   Lab Results  Component Value Date   CHOL 158 08/29/2018   HDL 34 (A) 05/06/2020   LDLCALC 103 05/06/2020   TRIG 154 05/06/2020    Significant Diagnostic Results in last 30 days:  No results found.  Assessment/Plan 1. Adult failure to thrive - appears frail - enrolled in hospice 09/12/2021 - progressive weakness and weight loss  2. Pressure injury of left buttock, stage 2 ([place]) - improving, area closed with no drainage, remains non blanchable - cont daily wound care - cont zinc oxide to pressure points x 3 weeks - promote frequent position changes  3. Weight loss - unsuccessful supervised meals - cont boost and remeron - cont weekly weights  4. DYSPHAGIA - question 08/2021 left lower lobe pneumonia- aspiration? - refused dysphagia diet due to weight loss - cont regular diet with bite sized meats  5. Urinary retention - cont chronic foley - urine yellow/clear today  6. RLS (restless legs syndrome) - cont requip  7. Gastroesophageal reflux disease, unspecified whether esophagitis present - cont prilosec  8. Depression, recurrent ([place]) - flat affect - cont wellbutrin and Remeron  9. Acute pain of right shoulder - x-ray negative for dislocation/fracture - cont norco prn    Family/ staff Communication: plan discussed with patient and nurse  Labs/tests ordered:  none

## 2021-09-16 DIAGNOSIS — F0283 Dementia in other diseases classified elsewhere, unspecified severity, with mood disturbance: Secondary | ICD-10-CM | POA: Diagnosis not present

## 2021-09-16 DIAGNOSIS — R1310 Dysphagia, unspecified: Secondary | ICD-10-CM | POA: Diagnosis not present

## 2021-09-16 DIAGNOSIS — G311 Senile degeneration of brain, not elsewhere classified: Secondary | ICD-10-CM | POA: Diagnosis not present

## 2021-09-16 DIAGNOSIS — E43 Unspecified severe protein-calorie malnutrition: Secondary | ICD-10-CM | POA: Diagnosis not present

## 2021-09-16 DIAGNOSIS — L89322 Pressure ulcer of left buttock, stage 2: Secondary | ICD-10-CM | POA: Diagnosis not present

## 2021-09-16 DIAGNOSIS — A0471 Enterocolitis due to Clostridium difficile, recurrent: Secondary | ICD-10-CM | POA: Diagnosis not present

## 2021-10-04 DEATH — deceased
